# Patient Record
Sex: Male | Born: 1935 | Race: Black or African American | Hispanic: No | Smoking: Never smoker
Health system: Southern US, Community
[De-identification: ages and names within clinical notes are randomized; demographics above are authoritative.]

## PROBLEM LIST (undated history)

## (undated) DIAGNOSIS — Z9981 Dependence on supplemental oxygen: Secondary | ICD-10-CM

## (undated) DIAGNOSIS — M109 Gout, unspecified: Secondary | ICD-10-CM

## (undated) DIAGNOSIS — K219 Gastro-esophageal reflux disease without esophagitis: Secondary | ICD-10-CM

## (undated) DIAGNOSIS — I472 Ventricular tachycardia, unspecified: Secondary | ICD-10-CM

## (undated) DIAGNOSIS — I5020 Unspecified systolic (congestive) heart failure: Secondary | ICD-10-CM

## (undated) DIAGNOSIS — I2510 Atherosclerotic heart disease of native coronary artery without angina pectoris: Secondary | ICD-10-CM

## (undated) DIAGNOSIS — I255 Ischemic cardiomyopathy: Secondary | ICD-10-CM

## (undated) DIAGNOSIS — M1990 Unspecified osteoarthritis, unspecified site: Secondary | ICD-10-CM

## (undated) DIAGNOSIS — I4729 Other ventricular tachycardia: Secondary | ICD-10-CM

## (undated) DIAGNOSIS — IMO0001 Reserved for inherently not codable concepts without codable children: Secondary | ICD-10-CM

## (undated) DIAGNOSIS — K5730 Diverticulosis of large intestine without perforation or abscess without bleeding: Secondary | ICD-10-CM

## (undated) DIAGNOSIS — E785 Hyperlipidemia, unspecified: Secondary | ICD-10-CM

## (undated) DIAGNOSIS — I10 Essential (primary) hypertension: Secondary | ICD-10-CM

## (undated) DIAGNOSIS — K56609 Unspecified intestinal obstruction, unspecified as to partial versus complete obstruction: Secondary | ICD-10-CM

## (undated) DIAGNOSIS — Z95810 Presence of automatic (implantable) cardiac defibrillator: Secondary | ICD-10-CM

## (undated) HISTORY — PX: EYE SURGERY: SHX253

## (undated) HISTORY — PX: CATARACT EXTRACTION W/ INTRAOCULAR LENS  IMPLANT, BILATERAL: SHX1307

## (undated) HISTORY — PX: CHOLECYSTECTOMY: SHX55

## (undated) HISTORY — DX: Atherosclerotic heart disease of native coronary artery without angina pectoris: I25.10

## (undated) HISTORY — PX: BOWEL RESECTION: SHX1257

## (undated) HISTORY — PX: PACEMAKER PLACEMENT: SHX43

## (undated) HISTORY — PX: TONSILLECTOMY: SUR1361

---

## 1998-01-14 ENCOUNTER — Ambulatory Visit (HOSPITAL_COMMUNITY): Admission: RE | Admit: 1998-01-14 | Discharge: 1998-01-14 | Payer: Self-pay | Admitting: Cardiology

## 1999-07-13 ENCOUNTER — Encounter: Payer: Self-pay | Admitting: Emergency Medicine

## 1999-07-13 ENCOUNTER — Emergency Department (HOSPITAL_COMMUNITY): Admission: EM | Admit: 1999-07-13 | Discharge: 1999-07-13 | Payer: Self-pay | Admitting: Emergency Medicine

## 2000-09-27 ENCOUNTER — Emergency Department (HOSPITAL_COMMUNITY): Admission: EM | Admit: 2000-09-27 | Discharge: 2000-09-27 | Payer: Self-pay | Admitting: Emergency Medicine

## 2001-01-22 ENCOUNTER — Encounter: Payer: Self-pay | Admitting: Family Medicine

## 2001-01-22 ENCOUNTER — Encounter: Admission: RE | Admit: 2001-01-22 | Discharge: 2001-01-22 | Payer: Self-pay | Admitting: Family Medicine

## 2001-08-24 ENCOUNTER — Encounter (INDEPENDENT_AMBULATORY_CARE_PROVIDER_SITE_OTHER): Payer: Self-pay

## 2001-08-24 ENCOUNTER — Ambulatory Visit (HOSPITAL_COMMUNITY): Admission: RE | Admit: 2001-08-24 | Discharge: 2001-08-24 | Payer: Self-pay | Admitting: Gastroenterology

## 2001-10-17 ENCOUNTER — Encounter: Admission: RE | Admit: 2001-10-17 | Discharge: 2002-01-15 | Payer: Self-pay | Admitting: Family Medicine

## 2002-02-02 ENCOUNTER — Emergency Department (HOSPITAL_COMMUNITY): Admission: EM | Admit: 2002-02-02 | Discharge: 2002-02-02 | Payer: Self-pay | Admitting: *Deleted

## 2003-02-11 ENCOUNTER — Encounter: Payer: Self-pay | Admitting: Emergency Medicine

## 2003-02-11 ENCOUNTER — Emergency Department (HOSPITAL_COMMUNITY): Admission: EM | Admit: 2003-02-11 | Discharge: 2003-02-11 | Payer: Self-pay | Admitting: Emergency Medicine

## 2003-03-06 HISTORY — PX: TEE WITH CARDIOVERSION: SHX5442

## 2003-03-06 HISTORY — PX: CORONARY ARTERY BYPASS GRAFT: SHX141

## 2003-03-10 ENCOUNTER — Encounter: Payer: Self-pay | Admitting: Emergency Medicine

## 2003-03-10 ENCOUNTER — Inpatient Hospital Stay (HOSPITAL_COMMUNITY): Admission: EM | Admit: 2003-03-10 | Discharge: 2003-03-20 | Payer: Self-pay | Admitting: Emergency Medicine

## 2003-03-12 ENCOUNTER — Encounter: Payer: Self-pay | Admitting: Cardiology

## 2003-03-14 ENCOUNTER — Encounter: Payer: Self-pay | Admitting: Surgery

## 2003-03-15 ENCOUNTER — Encounter: Payer: Self-pay | Admitting: Surgery

## 2003-03-16 ENCOUNTER — Encounter: Payer: Self-pay | Admitting: Thoracic Surgery (Cardiothoracic Vascular Surgery)

## 2003-06-02 ENCOUNTER — Encounter (HOSPITAL_COMMUNITY): Admission: RE | Admit: 2003-06-02 | Discharge: 2003-08-31 | Payer: Self-pay | Admitting: Cardiology

## 2003-07-07 HISTORY — PX: CARDIAC DEFIBRILLATOR PLACEMENT: SHX171

## 2003-07-17 ENCOUNTER — Ambulatory Visit (HOSPITAL_COMMUNITY): Admission: RE | Admit: 2003-07-17 | Discharge: 2003-07-18 | Payer: Self-pay | Admitting: Internal Medicine

## 2004-01-09 ENCOUNTER — Emergency Department (HOSPITAL_COMMUNITY): Admission: EM | Admit: 2004-01-09 | Discharge: 2004-01-09 | Payer: Self-pay | Admitting: Family Medicine

## 2004-01-12 ENCOUNTER — Emergency Department (HOSPITAL_COMMUNITY): Admission: EM | Admit: 2004-01-12 | Discharge: 2004-01-12 | Payer: Self-pay | Admitting: Emergency Medicine

## 2004-01-16 ENCOUNTER — Emergency Department (HOSPITAL_COMMUNITY): Admission: EM | Admit: 2004-01-16 | Discharge: 2004-01-16 | Payer: Self-pay | Admitting: Family Medicine

## 2004-01-20 ENCOUNTER — Emergency Department (HOSPITAL_COMMUNITY): Admission: EM | Admit: 2004-01-20 | Discharge: 2004-01-20 | Payer: Self-pay | Admitting: Family Medicine

## 2004-05-07 ENCOUNTER — Emergency Department (HOSPITAL_COMMUNITY): Admission: EM | Admit: 2004-05-07 | Discharge: 2004-05-07 | Payer: Self-pay

## 2004-08-11 ENCOUNTER — Ambulatory Visit: Payer: Self-pay | Admitting: Internal Medicine

## 2004-08-28 ENCOUNTER — Emergency Department (HOSPITAL_COMMUNITY): Admission: EM | Admit: 2004-08-28 | Discharge: 2004-08-28 | Payer: Self-pay | Admitting: Family Medicine

## 2004-09-12 ENCOUNTER — Emergency Department (HOSPITAL_COMMUNITY): Admission: EM | Admit: 2004-09-12 | Discharge: 2004-09-12 | Payer: Self-pay | Admitting: Family Medicine

## 2004-09-21 ENCOUNTER — Emergency Department (HOSPITAL_COMMUNITY): Admission: EM | Admit: 2004-09-21 | Discharge: 2004-09-21 | Payer: Self-pay | Admitting: *Deleted

## 2004-10-27 ENCOUNTER — Emergency Department (HOSPITAL_COMMUNITY): Admission: EM | Admit: 2004-10-27 | Discharge: 2004-10-27 | Payer: Self-pay | Admitting: Family Medicine

## 2004-11-21 ENCOUNTER — Emergency Department (HOSPITAL_COMMUNITY): Admission: EM | Admit: 2004-11-21 | Discharge: 2004-11-21 | Payer: Self-pay | Admitting: Emergency Medicine

## 2004-12-23 ENCOUNTER — Ambulatory Visit: Payer: Self-pay

## 2005-03-01 ENCOUNTER — Ambulatory Visit: Payer: Self-pay | Admitting: Internal Medicine

## 2005-03-21 ENCOUNTER — Ambulatory Visit: Payer: Self-pay | Admitting: Internal Medicine

## 2005-03-25 ENCOUNTER — Emergency Department (HOSPITAL_COMMUNITY): Admission: EM | Admit: 2005-03-25 | Discharge: 2005-03-25 | Payer: Self-pay | Admitting: Family Medicine

## 2005-04-30 ENCOUNTER — Emergency Department (HOSPITAL_COMMUNITY): Admission: EM | Admit: 2005-04-30 | Discharge: 2005-05-01 | Payer: Self-pay | Admitting: Emergency Medicine

## 2005-05-28 ENCOUNTER — Emergency Department (HOSPITAL_COMMUNITY): Admission: EM | Admit: 2005-05-28 | Discharge: 2005-05-29 | Payer: Self-pay | Admitting: Emergency Medicine

## 2005-10-19 ENCOUNTER — Ambulatory Visit: Payer: Self-pay | Admitting: Internal Medicine

## 2005-11-14 ENCOUNTER — Emergency Department (HOSPITAL_COMMUNITY): Admission: EM | Admit: 2005-11-14 | Discharge: 2005-11-14 | Payer: Self-pay | Admitting: Family Medicine

## 2006-04-13 ENCOUNTER — Emergency Department (HOSPITAL_COMMUNITY): Admission: EM | Admit: 2006-04-13 | Discharge: 2006-04-14 | Payer: Self-pay | Admitting: Emergency Medicine

## 2006-04-14 ENCOUNTER — Encounter: Payer: Self-pay | Admitting: Vascular Surgery

## 2006-06-08 ENCOUNTER — Ambulatory Visit: Payer: Self-pay

## 2006-06-14 ENCOUNTER — Emergency Department (HOSPITAL_COMMUNITY): Admission: EM | Admit: 2006-06-14 | Discharge: 2006-06-14 | Payer: Self-pay | Admitting: Family Medicine

## 2007-03-22 ENCOUNTER — Ambulatory Visit: Payer: Self-pay | Admitting: Internal Medicine

## 2007-04-15 ENCOUNTER — Emergency Department (HOSPITAL_COMMUNITY): Admission: EM | Admit: 2007-04-15 | Discharge: 2007-04-15 | Payer: Self-pay | Admitting: Emergency Medicine

## 2007-05-19 ENCOUNTER — Emergency Department (HOSPITAL_COMMUNITY): Admission: EM | Admit: 2007-05-19 | Discharge: 2007-05-20 | Payer: Self-pay | Admitting: Emergency Medicine

## 2007-07-09 ENCOUNTER — Emergency Department (HOSPITAL_COMMUNITY): Admission: EM | Admit: 2007-07-09 | Discharge: 2007-07-09 | Payer: Self-pay | Admitting: Emergency Medicine

## 2007-07-24 ENCOUNTER — Ambulatory Visit: Payer: Self-pay | Admitting: Internal Medicine

## 2007-09-11 ENCOUNTER — Emergency Department (HOSPITAL_COMMUNITY): Admission: EM | Admit: 2007-09-11 | Discharge: 2007-09-12 | Payer: Self-pay | Admitting: Emergency Medicine

## 2007-10-11 ENCOUNTER — Ambulatory Visit: Payer: Self-pay

## 2008-01-07 ENCOUNTER — Ambulatory Visit: Payer: Self-pay

## 2008-04-09 ENCOUNTER — Ambulatory Visit: Payer: Self-pay

## 2008-04-25 ENCOUNTER — Encounter: Admission: RE | Admit: 2008-04-25 | Discharge: 2008-04-25 | Payer: Self-pay | Admitting: Cardiology

## 2008-07-22 ENCOUNTER — Ambulatory Visit: Payer: Self-pay | Admitting: Internal Medicine

## 2008-10-16 ENCOUNTER — Ambulatory Visit (HOSPITAL_COMMUNITY): Admission: RE | Admit: 2008-10-16 | Discharge: 2008-10-16 | Payer: Self-pay | Admitting: Ophthalmology

## 2008-11-18 ENCOUNTER — Encounter: Payer: Self-pay | Admitting: Internal Medicine

## 2009-01-19 ENCOUNTER — Encounter: Admission: RE | Admit: 2009-01-19 | Discharge: 2009-01-19 | Payer: Self-pay | Admitting: Cardiology

## 2009-01-21 ENCOUNTER — Encounter (INDEPENDENT_AMBULATORY_CARE_PROVIDER_SITE_OTHER): Payer: Self-pay | Admitting: *Deleted

## 2009-06-29 ENCOUNTER — Encounter (HOSPITAL_COMMUNITY): Admission: RE | Admit: 2009-06-29 | Discharge: 2009-09-04 | Payer: Self-pay | Admitting: Cardiology

## 2009-07-17 DIAGNOSIS — Z951 Presence of aortocoronary bypass graft: Secondary | ICD-10-CM

## 2009-07-17 DIAGNOSIS — I2510 Atherosclerotic heart disease of native coronary artery without angina pectoris: Secondary | ICD-10-CM

## 2009-07-23 ENCOUNTER — Ambulatory Visit: Payer: Self-pay | Admitting: Internal Medicine

## 2009-07-23 DIAGNOSIS — Z95810 Presence of automatic (implantable) cardiac defibrillator: Secondary | ICD-10-CM | POA: Insufficient documentation

## 2009-07-23 DIAGNOSIS — I5022 Chronic systolic (congestive) heart failure: Secondary | ICD-10-CM

## 2010-03-22 ENCOUNTER — Emergency Department (HOSPITAL_COMMUNITY): Admission: EM | Admit: 2010-03-22 | Discharge: 2010-03-22 | Payer: Self-pay | Admitting: Family Medicine

## 2010-08-11 ENCOUNTER — Encounter
Admission: RE | Admit: 2010-08-11 | Discharge: 2010-09-04 | Payer: Self-pay | Source: Home / Self Care | Attending: Cardiology | Admitting: Cardiology

## 2010-08-12 ENCOUNTER — Emergency Department (HOSPITAL_COMMUNITY)
Admission: EM | Admit: 2010-08-12 | Discharge: 2010-08-12 | Payer: Self-pay | Source: Home / Self Care | Admitting: Emergency Medicine

## 2010-08-17 ENCOUNTER — Observation Stay (HOSPITAL_COMMUNITY)
Admission: EM | Admit: 2010-08-17 | Discharge: 2010-08-19 | Payer: Self-pay | Source: Home / Self Care | Attending: Cardiology | Admitting: Cardiology

## 2010-08-20 ENCOUNTER — Encounter (INDEPENDENT_AMBULATORY_CARE_PROVIDER_SITE_OTHER): Payer: Self-pay | Admitting: *Deleted

## 2010-09-30 ENCOUNTER — Ambulatory Visit
Admission: RE | Admit: 2010-09-30 | Discharge: 2010-09-30 | Payer: Self-pay | Source: Home / Self Care | Attending: Internal Medicine | Admitting: Internal Medicine

## 2010-10-07 NOTE — Assessment & Plan Note (Signed)
Summary: per check out/sf  Medications Added ASPIRIN EC 325 MG TBEC (ASPIRIN) Take one/half   tablet by mouth daily      Allergies Added: NKDA  Visit Type:  Follow-up Primary Provider:  Otho Najjar MD  CC:  no complaints.  History of Present Illness: [name] returns today for followup.  He is an 77 yo man with a h/o an ICM and VT, s/p ICD implant.  He has class 2 CHF.  He has remained active and is still playing golf.  He denies c/p, sob, peripheral edema and has not experienced and ICD discharges.  Current Medications (verified): 1)  Spironolactone 25 Mg Tabs (Spironolactone) .... Take One Tablet By Mouth Daily 2)  Plavix 75 Mg Tabs (Clopidogrel Bisulfate) .... Take One Tablet By Mouth Daily 3)  Lipitor 20 Mg Tabs (Atorvastatin Calcium) .... Take One Tablet By Mouth Daily. 4)  Ziac 2.5-6.25 Mg Tabs (Bisoprolol-Hydrochlorothiazide) .Marland Kitchen.. 1 By Mouth Once Daily 5)  Aspirin 81 Mg Tbec (Aspirin) .... Take One Tablet By Mouth Daily 6)  Aspirin Ec 325 Mg Tbec (Aspirin) .... Take One/half   Tablet By Mouth Daily  Allergies (verified): No Known Drug Allergies  Past History:  Past Medical History: Last updated: 07/17/2009  coronary artery disease   pacemaker, bipass   Past Surgical History: Last updated: 07/23/2009  CABG - Coronary artery bypass graft,   Vital Signs:  Patient profile:   77 year old male Height:      65 inches Weight:      160 pounds BMI:     26.72 Pulse rate:   60 / minute BP sitting:   130 / 70  (left arm)  Vitals Entered By: Laurance Flatten CMA (September 30, 2010 12:01 PM)  Physical Exam  General:  Elderly well developed, well nourished, in no acute distress.  HEENT: normal Neck: supple. No JVD. Carotids 2+ bilaterally no bruits Cor: RRR no rubs, gallops or murmur Lungs: CTA.  Well healed ICD incision. Ab: soft, nontender. nondistended. No HSM. Good bowel sounds Ext: warm. no cyanosis, clubbing or edema Neuro: alert and oriented. Grossly nonfocal.  affect pleasant     ICD Specifications Following MD:  [name], MD     ICD Vendor:  Medtronic     ICD Model Number:  7232     ICD Serial Number:  [id number] ICD DOI:  07/17/2003     ICD Implanting MD:  [name], MD  Lead 1:    Location: RV     DOI: 07/17/2003     Model #: 2440     Serial #: [id number]     Status: active  Indications::  ICM   ICD Follow Up Battery Voltage:  2.71 V     Charge Time:  10.09 seconds     Underlying rhythm:  SR ICD Dependent:  No       ICD Device Measurements Right Ventricle:  Amplitude: 1.4 mV, Impedance: 364 ohms, Threshold: 1.0 V at 0.3 msec Shock Impedance: 43/59 ohms   Episodes MS Episodes:  0     Percent Mode Switch:  0     Shock:  0     ATP:  0     Nonsustained:  0     Atrial Therapies:  0 Ventricular Pacing:  2%  Brady Parameters Mode VVI     Lower Rate Limit:  40      Tachy Zones VF:  200     VT:  171     Next  Cardiology Appt Due:  09/06/2011 Tech Comments:  7 NST EPISODES.  NORMAL DEVICE FUNCTION.  NO CHANGES MADE. ROV IN 12 MTHS W/GT. Vella Kohler  September 30, 2010 11:57 AM MD Comments:  Agree with above.  Impression & Recommendations:  Problem # 1:  AUTOMATIC IMPLANTABLE CARDIAC DEFIBRILLATOR SITU (ICD-V45.02) HIs device is working normally. Will recheck in several months.  Problem # 2:  CHRONIC SYSTOLIC HEART FAILURE (ICD-428.22) His symptoms are class 2. Continue meds as below and maintain a low sodium diet. His updated medication list for this problem includes:    Spironolactone 25 Mg Tabs (Spironolactone) .Marland Kitchen... Take one tablet by mouth daily    Plavix 75 Mg Tabs (Clopidogrel bisulfate) .Marland Kitchen... Take one tablet by mouth daily    Ziac 2.5-6.25 Mg Tabs (Bisoprolol-hydrochlorothiazide) .Marland Kitchen... 1 by mouth once daily    Aspirin 81 Mg Tbec (Aspirin) .Marland Kitchen... Take one tablet by mouth daily    Aspirin Ec 325 Mg Tbec (Aspirin) .Marland Kitchen... Take one/half   tablet by mouth daily  Problem # 3:  CORONARY ARTERY DISEASE (ICD-414.00) He denies  anginal symptoms. Continue meds as below. His updated medication list for this problem includes:    Plavix 75 Mg Tabs (Clopidogrel bisulfate) .Marland Kitchen... Take one tablet by mouth daily    Ziac 2.5-6.25 Mg Tabs (Bisoprolol-hydrochlorothiazide) .Marland Kitchen... 1 by mouth once daily    Aspirin 81 Mg Tbec (Aspirin) .Marland Kitchen... Take one tablet by mouth daily    Aspirin Ec 325 Mg Tbec (Aspirin) .Marland Kitchen... Take one/half   tablet by mouth daily  Patient Instructions: 1)  Your physician wants you to follow-up in: 6 months with Dr Court Joy will receive a reminder letter in the mail two months in advance. If you don't receive a letter, please call our office to schedule the follow-up appointment.

## 2010-10-07 NOTE — Letter (Signed)
Summary: Appointment - Missed  [facility], Main Office  [address]   [place], [place] [address]   Phone: [phone]  Fax: [phone]     August 20, 2010 MRN: [id number]   MIKOLAJ WOOLSTENHULME [address] [place], [place]  [address]   Dear Mr. Sutherland,  Our records indicate you missed your appointment on 08-10-10  with Dr.  Ladona Ridgel .                                    It is very important that we reach you to reschedule this appointment. We look forward to participating in your health care needs. Please contact us at the number listed above at your earliest convenience to reschedule this appointment.     Sincerely,    Glass blower/designer

## 2010-11-15 LAB — CBC
Hemoglobin: 12.5 g/dL — ABNORMAL LOW (ref 13.0–17.0)
MCH: 32 pg (ref 26.0–34.0)
MCHC: 34.2 g/dL (ref 30.0–36.0)
MCHC: 35.6 g/dL (ref 30.0–36.0)
MCV: 89.8 fL (ref 78.0–100.0)
Platelets: 147 10*3/uL — ABNORMAL LOW (ref 150–400)
RDW: 13.1 % (ref 11.5–15.5)
WBC: 8.2 10*3/uL (ref 4.0–10.5)

## 2010-11-15 LAB — URINALYSIS, MICROSCOPIC ONLY
Glucose, UA: NEGATIVE mg/dL
Leukocytes, UA: NEGATIVE
Protein, ur: NEGATIVE mg/dL
pH: 5 (ref 5.0–8.0)

## 2010-11-15 LAB — COMPREHENSIVE METABOLIC PANEL
ALT: 18 U/L (ref 0–53)
AST: 25 U/L (ref 0–37)
Albumin: 2.6 g/dL — ABNORMAL LOW (ref 3.5–5.2)
Alkaline Phosphatase: 73 U/L (ref 39–117)
BUN: 44 mg/dL — ABNORMAL HIGH (ref 6–23)
CO2: 22 mEq/L (ref 19–32)
Calcium: 8.5 mg/dL (ref 8.4–10.5)
Calcium: 9.2 mg/dL (ref 8.4–10.5)
Creatinine, Ser: 1.64 mg/dL — ABNORMAL HIGH (ref 0.4–1.5)
GFR calc Af Amer: 57 mL/min — ABNORMAL LOW (ref >60)
Potassium: 4 mEq/L (ref 3.5–5.1)
Sodium: 130 mEq/L — ABNORMAL LOW (ref 135–145)
Total Bilirubin: 1.3 mg/dL — ABNORMAL HIGH (ref 0.3–1.2)
Total Protein: 5.8 g/dL — ABNORMAL LOW (ref 6.0–8.3)
Total Protein: 7.1 g/dL (ref 6.0–8.3)

## 2010-11-15 LAB — DIFFERENTIAL
Eosinophils Absolute: 0 10*3/uL (ref 0.0–0.7)
Eosinophils Relative: 0 % (ref 0–5)
Lymphs Abs: 0.5 10*3/uL — ABNORMAL LOW (ref 0.7–4.0)
Monocytes Relative: 9 % (ref 3–12)

## 2010-12-20 NOTE — Discharge Summary (Signed)
  [name], [name] NO.:  [id number]  MEDICAL RECORD NO.:  [id number]          PATIENT TYPE:  OBV  LOCATION:  4709                         FACILITY:  [facility]  PHYSICIAN:  Osvaldo Shipper. Spirit Wernli, M.D.DATE OF BIRTH:  Mar 14, 1927  DATE OF ADMISSION:  08/17/2010 DATE OF DISCHARGE:  08/19/2010                              DISCHARGE SUMMARY   DISCHARGE DIAGNOSES: 1. Acute gout. 2. Coronary artery disease. 3. Degenerative joint disease. 4. Acute inability to ambulate.  [name] is an 77 year old patient who presented to the [facility] with complaint of foot pain.  The patient had been treated as an outpatient for this problem and continued to have increasing severity of pain in the left foot creating an inability for him to walk.  On examination, he was noted to have right and left great toe pain and tenderness to touch.  His uric acid levels were significantly elevated and he was subsequently admitted with acute gout with inability to ambulate and no home support.  HOSPITAL COURSE:  The patient was admitted to telemetry.  He was placed on colchicine, Motrin, and IV Dilaudid cautiously.  He was seen by pharmacy for Lovenox prophylaxis.  On December 15, the patient was able to ambulate and it was the opinion that he had received maximum benefit from this hospitalization and could safely be returned home.  The patient is to continue his medications at discharge.  He will continue on colchicine during this pain episode.  He will also be treated with an oral narcotic pain medication and he will start allopurinol on the 26 and will be followed in the office that same week for reevaluation.  The patient is advised to notify the physician immediately of any changes, problems, or concerns.  He is very strongly encouraged to use caution getting around given his improved, but limited ambulation, and his advanced age.     Ivery Quale,  P.A.   ______________________________ Osvaldo Shipper. Andreana Klingerman, M.D.    HB/MEDQ  D:  11/11/2010  T:  11/11/2010  Job:  [id number]  Electronically Signed by Ivery Quale P.A. on 11/18/2010 12:22:13 PM Electronically Signed by Donia Guiles M.D. on 12/20/2010 08:40:26 PM

## 2010-12-21 LAB — HEPATIC FUNCTION PANEL
Albumin: 3.8 g/dL (ref 3.5–5.2)
Total Protein: 6.7 g/dL (ref 6.0–8.3)

## 2010-12-21 LAB — CBC
MCHC: 35.3 g/dL (ref 30.0–36.0)
Platelets: 119 10*3/uL — ABNORMAL LOW (ref 150–400)
RBC: 3.92 MIL/uL — ABNORMAL LOW (ref 4.22–5.81)
RDW: 15.7 % — ABNORMAL HIGH (ref 11.5–15.5)

## 2010-12-21 LAB — APTT: aPTT: 31 seconds (ref 24–37)

## 2010-12-21 LAB — PROTIME-INR
INR: 1 (ref 0.00–1.49)
Prothrombin Time: 13.5 seconds (ref 11.6–15.2)

## 2010-12-21 LAB — GLUCOSE, CAPILLARY: Glucose-Capillary: 132 mg/dL — ABNORMAL HIGH (ref 70–99)

## 2010-12-21 LAB — BASIC METABOLIC PANEL
CO2: 28 mEq/L (ref 19–32)
Calcium: 9.7 mg/dL (ref 8.4–10.5)
Creatinine, Ser: 1.27 mg/dL (ref 0.4–1.5)
GFR calc Af Amer: >60 mL/min (ref >60)

## 2011-01-06 ENCOUNTER — Inpatient Hospital Stay (INDEPENDENT_AMBULATORY_CARE_PROVIDER_SITE_OTHER)
Admission: RE | Admit: 2011-01-06 | Discharge: 2011-01-06 | Disposition: A | Discharge status: Home / Self Care | Payer: Self-pay | Source: Ambulatory Visit | Attending: Emergency Medicine | Admitting: Emergency Medicine

## 2011-01-06 DIAGNOSIS — S01309A Unspecified open wound of unspecified ear, initial encounter: Secondary | ICD-10-CM

## 2011-01-18 NOTE — Assessment & Plan Note (Signed)
[facility]                         ELECTROPHYSIOLOGY OFFICE NOTE   NAME:[name], [name] PILLSBURY                      MRN:          [id number]  DATE:07/22/2008                            DOB:          [birth year]    [name] returns today for followup.  He is a very pleasant elderly  male with a history of ischemic cardiomyopathy and congestive heart  failure, status post BiV ICD insertion rather status post single chamber  ICD insertion.  He returns today for followup.  The patient admits to  some medical noncompliance and he has been out of his medicines for  several weeks.  He has been seen by Dr. Brunilda Payor and has had some prostate  problem is unclear whether he has prostate cancer or not.  He has  scheduled for a procedure in December to evaluate this.  By his report,  he suppose to stop his Plavix.  Today, his medications of which he is  not taking previously were;  1. Bisoprolol/HCTZ 2.5/6.25 daily.  2. Aldactone 25 daily.  3. Plavix 75 daily.  4. Lipitor 20 a day.  5. Aspirin 81 a day.   PHYSICAL EXAMINATION:  GENERAL:  He is a pleasant, elderly-appearing man  in no acute distress.  VITAL SIGNS:  Blood pressure is 110/70, the pulse is 50 and regular,  respirations are 18, and the weight is 169 pounds.  NECK:  No jugular venous distention.  LUNGS:  Clear bilaterally auscultation.  No wheezes, rales, or rhonchi  are present.  CARDIOVASCULAR:  Regular bradycardia with normal S1 and S2.  ABDOMEN:  Soft and nontender.  EXTREMITIES:  No peripheral edema.   Interrogation of his defibrillator demonstrates a Medtronic Maximo, the  R-waves were 3 (this is a chronic), impendence 332, threshold 1 volt at  0.4.  The battery voltage was 2.99 volts.  Underlying rhythm was sinus  bradycardia with frequent PVCs.  There are no intercurrent IC therapies.  He was 3% V paced.   IMPRESSION:  1. Ischemic cardiomyopathy.  2. Congestive heart failure.  3. Status post  implantable cardioverter-defibrillator insertion.   DISCUSSION:  Overall, [name] is stable.  I have asked to stop his  Plavix in late November preceding his pending urologic procedure.  I  will see him back in the office in 1 year for followup.  I have asked  him to maintain a low-salt diet.    Doylene Canning. Ladona Ridgel, MD  Electronically Signed   GWT/MedQ  DD: 07/22/2008  DT: 07/23/2008  Job #: [id number]

## 2011-01-18 NOTE — Op Note (Signed)
NAMEANGUEL, [name]               ACCOUNT NO.:  [id number]   MEDICAL RECORD NO.:  [id number]          PATIENT TYPE:  AMB   LOCATION:  SDS                          FACILITY:  [facility]   PHYSICIAN:  Chalmers Guest, M.D.     DATE OF BIRTH:  04-Nov-1926   DATE OF PROCEDURE:  10/16/2008  DATE OF DISCHARGE:                               OPERATIVE REPORT   PREOPERATIVE DIAGNOSIS:  Visually significant cataract, left eye.   POSTOPERATIVE DIAGNOSIS:  Visually significant cataract, left eye.   INDICATION FOR HOSPITAL SURGERY:  The patient has a defibrillator and  pacemaker for previous cardiovascular disease.   PROCEDURE:  Phacoemulsification intraocular lens implant.   COMPLICATIONS:  None.   ANESTHESIA:  Consisted of 2% Xylocaine and a 50:50 mixture of 0.75%  Marcaine with an ampule of Wydase.   PROCEDURE IN DETAIL:  The patient was transferred to the operating room  where a peribulbar block was given under monitored anesthesia with the  aforementioned local anesthetic agent.  Following this with the surgeon  sitting temporally and the operating microscope aligned temporally, a  Weck-cel sponge was used to fixate the globe and a 15-degree blade was  used to enter the eye at the 5 o'clock position and Viscoat was injected  in the eye.  Following this, the Weck-cel was used but the globe would  not remain fixated.  Therefore, a 0.12 was used to fixate the globe and  a clear cornea incision was made into the anterior chamber.  Additional  viscoelastic was injected.  A subconjunctival hemorrhage formed at the  site of the 0.12 temporally.  Following this, a bent 25-gauge needle was  used to incise the anterior capsule, a curvilinear capsulorrhexis was  formed, and anterior capsule was removed with Utrata forceps.  Following  this, BSS was used to hydrodissect the nucleus and the nucleus was seen  to spin freely in a capsular bag.  The phacoemulsification unit was then  used to sculpt the  nucleus and the nucleus was sculpted and divided into  4 quadrants and a total nucleus was removed with phaco time with 0.9.  Following this, the I/A was used to remove the cortical fibers.  The  posterior capsule remained intact.  Therefore, intraocular lens was  inspected and noted to have no defects.  The lens was an Alcon AcrySof  IQ, power 27.0 diopter lens, [id number].  The lens was placed in the lens  injector.  It was injected and unfolded into the capsular bag.  It was  positioned with the Kuglen hook.  Following this, the I/A was used to  remove viscoelastic from the eye and Miostat was injected.  A single 10-  0 nylon suture was placed.  BSS was injected in the eye and the lens was  repositioned.  Watertight closure was  achieved with no leakage.  Therefore, the lid speculum was removed from  the eye and topical gentamicin was dripped on the eye as well as  TobraDex ointment.  A patch and [name] shield were placed and the patient  returned to the recovery area in stable  condition.      Chalmers Guest, M.D.  Electronically Signed     RW/MEDQ  D:  10/16/2008  T:  10/16/2008  Job:  [id number]   cc:   Fax #:  [phone]

## 2011-01-18 NOTE — Assessment & Plan Note (Signed)
[facility]                         ELECTROPHYSIOLOGY OFFICE NOTE   NAME:Kiesler, [name]                      MRN:          [id number]  DATE:07/24/2007                            DOB:          1926-09-27    [name] returns today after a several-year absence from our EP  clinic.  He is a very pleasant elderly man with a history of ischemic  cardiomyopathy, congestive heart failure, nonsustained VT, status post  ICD insertion.  He returns today for followup.  He denies chest pain.  His biggest complaint has been that of gout.  This is now better, after  being seen in the emergency department.   PHYSICAL EXAM:  He is a pleasant, well-appearing, 77 year old man, in no  acute distress.  Blood pressure is 122/74 with a pulse of 65 and regular, respirations  are 18, weight was 167 pounds.  NECK:  Revealed no jugular venous distention.  LUNGS:  Clear bilaterally to auscultation, no wheezes, rales or rhonchi.  CARDIOVASCULAR EXAM:  Revealed a regular rate and rhythm, normal S1 and  S2.  The PMI was not enlarged.  There were no gallops noted.  EXTREMITIES:  Demonstrated no cyanosis, clubbing or edema.   MEDICATIONS INCLUDE:  1. Bisoprolol/HCTZ 2.5/6.25 daily.  2. Enalapril 10 twice a day.  3. Aldactone 25 daily.  4. Lipitor 10 daily.  5. Aspirin 325 daily.   Interrogation of his defibrillator demonstrates a Medtronic Maximo with  R-waves of 3.5, the impedance of 356, the threshold of 0.23, the battery  voltage of 3.04 volts.  There were no intercurrent ICD therapies.   IMPRESSION:  1. Ischemic cardiomyopathy.  2. Congestive heart failure.  3. Status post ICD insertion.   DISCUSSION:  Overall, [name] is stable.  I have encouraged him to  maintain a low-sodium diet and take his medications.  We will see him  back in the office in one year and he will follow up in our device  clinic in three to four months.  We will also give him a flu  vaccine  today.     Doylene Canning. Ladona Ridgel, MD  Electronically Signed    GWT/MedQ  DD: 07/24/2007  DT: 07/24/2007  Job #: [id number]

## 2011-01-20 ENCOUNTER — Ambulatory Visit (INDEPENDENT_AMBULATORY_CARE_PROVIDER_SITE_OTHER): Payer: PRIVATE HEALTH INSURANCE | Admitting: *Deleted

## 2011-01-20 DIAGNOSIS — I428 Other cardiomyopathies: Secondary | ICD-10-CM

## 2011-01-20 DIAGNOSIS — I5022 Chronic systolic (congestive) heart failure: Secondary | ICD-10-CM

## 2011-01-21 NOTE — Op Note (Signed)
NAME:  [name], [name]                         ACCOUNT NO.:  [id number]   MEDICAL RECORD NO.:  [id number]                   PATIENT TYPE:  INP   LOCATION:  2312                                 FACILITY:  [facility]   PHYSICIAN:  Evelene Croon, M.D.                  DATE OF BIRTH:  November 13, 1926   DATE OF PROCEDURE:  03/14/2003  DATE OF DISCHARGE:                                 OPERATIVE REPORT   PREOPERATIVE DIAGNOSIS:  Severe 3-vessel coronary artery disease with severe  left ventricular dysfunction, status post  acute inferior myocardial  infarction.   POSTOPERATIVE DIAGNOSIS:  Severe 3-vessel coronary artery disease with  severe left ventricular dysfunction, status post  acute inferior myocardial  infarction.   PROCEDURE:  1. Median sternotomy, extracorporeal circulation, coronary artery bypass     graft surgery x5 using left internal mammary artery graft  to the left     anterior descending coronary artery with a saphenous vein graft to the     diagonal branch of the LAD, a  sequential saphenous vein graft to the 1st     and 2nd intermediate branches of the left circumflex coronary artery, and     a saphenous vein graft to the posterior descending branch of the right     coronary artery.  2. Endoscopic vein harvesting from the right thigh.  3. Placement of 2 left ventricular epicardial permanent pacing leads.   SURGEON:  Evelene Croon, M.D.   ASSISTANT:  Toribio Harbour, N.P.   ANESTHESIA:  General endotracheal anesthesia.   CLINICAL HISTORY:  This patient is a 77 year old gentleman  with no prior  cardiac history who was admitted with an evolving inferior myocardial  infarction. Cardiac catheterization revealed severe 3-vessel coronary artery  disease with severe left ventricular dysfunction with an ejection fraction  of about 15%. The LAD was a large vessel that had 70% proximal stenosis and  90% proximal to mid vessel stenosis. The diagonal branch had 95% proximal  stenosis. There was a large branching intermediate vessel that had a 1st  subbranch that had 90% stenosis. The major portion of the intermediate  artery had about 50% proximal stenosis. There was a small  AV groove portion  of the left circumflex that had 80% stenosis. The right coronary artery was  occluded proximally with filling of the distal vessel by right-to-right and  left-to-right collaterals. There was 1+ mitral regurgitation. There was  severe hypokinesis of the anterior wall and akinesis of the inferior wall.   The patient underwent an echocardiogram  which showed an ejection fraction  of about 20% to 30% with mild diffuse hypokinesis and akinesis of the  inferior posterior  wall and superolateral hypokinesis. There was mild  mitral regurgitation and no AI or AS.   After review of  the angiogram and examination of the patient, it was felt  that coronary artery bypass graft surgery was  the best treatment. I  discussed  the operative procedure with him including alternatives, benefits  and risks, including bleeding, blood transfusion, infection, stroke,  myocardial infarction and death. He understood and agreed to proceed.   DESCRIPTION OF PROCEDURE:  The patient was taken to the operating room and  placed on the table in the supine position. After induction of general  endotracheal anesthesia a Foley catheter was placed in the bladder using  sterile technique. Then the chest, abdomen and both lower extremities were  prepped and draped in the usual sterile manner.   The chest was entered through a median sternotomy incision and the  pericardium opened in the midline. Examination of the heart showed good  right ventricular contractility. The ascending aorta had no palpable plaques  in it.   A transesophageal echocardiogram  was performed and showed a dilated left  ventricle with severe hypokinesis of the anterior and lateral walls. The  inferior posterior  wall was akinetic.  There was trivia mitral  regurgitation.   Then the left internal mammary artery was harvested from the chest wall as a  pedicle graft. This was a medium caliber vessel with excellent blood flow  through it. At the same time a segment of greater saphenous vein was  harvested from the leg using endoscopic vein harvest technique. This vein  was of medium size and good quality.   Then the patient was heparinized and when an adequate activated clotting  time was achieved, the distal ascending aorta was cannulated using a 20  French aortic cannula for arterial inflow. Venous outflow was achieved using  a 2-stage venous cannula through the right atrial appendage. Then an  antegrade cardioplegia and vent cannula was inserted in the aortic root.   The patient was placed on cardiopulmonary bypass and the distal coronary  artery was identified. The LAD was a large graftable vessel. The diagonal  branch was intramyocardial but was a medium sized, graftable vessel. The 2  branches of the intermediate vessel were both intramyocardial but were  located and were suitable for grafting. The AV groove portion of the left  circumflex was not graftable. There was old inferior posterior MI with  scarring present throughout the inferior posterior wall. This area was  adhesed to the pericardium from old pericarditis. The right coronary artery  was a small vessel. It gave off a small posterior  descending branch which  was small  but suitable for grafting, even though this may not supply any  viable myocardium.   The aorta was cross clamped and 800 mL of cold blood antegrade cardioplegia  was administered in the aortic root with quick arrest of the heart. Systemic  hypothermia to 20 degrees centigrade and topical hypothermia with iced  saline was used. A temperature  probe was placed in the septum and was  inserted down the pericardium.  The 1st distal  anastomosis was performed to the 1st branch of the   intermediate coronary artery. The internal diameter of this vessel was 1.6  mm The conduit used was a segment of greater saphenous vein with anastomosis  performed in a  sequential side-to-side manner using continuous 7-0 Prolene  suture. The flow was measured through the graft and was excellent.   The 2nd distal anastomosis was performed to the 2nd marginal branch. The  internal diameter of this vessel was about 2 mm. The conduit used was the  same segment of greater saphenous vein and the anastomosis was performed in  a  sequential end-to-side manner using continuous 7-0 Prolene suture. Flow  was noted through the graft  and was excellent. Then another dose of  cardioplegia was given down the vein graft and the aortic root.   A 3rd distal anastomosis was performed to the diagonal branch. The internal  diameter was about 1.5 mm. The conduit used was a segment of greater  saphenous vein. The anastomosis was performed in an end-to-side manner using  continuous 7-0 Prolene suture. Flow was noted through the graft  and was  excellent.   The 4th distal anastomosis was then performed in the posterior descending  coronary artery. The internal diameter was 1.5 mm. The conduit used was a  3rd segment of greater saphenous vein and the anastomosis was performed in  an end-to-side manner using continuous 7-0 Prolene suture. Flow was noted  through the graft and was excellent. Another dose of cardioplegia was given  down the vein graft and into the aortic root.   The 5th distal anastomosis was then performed to the midportion of the left  anterior descending coronary artery. The internal diameter was about 2 mm.  The conduit used was a left internal mammary graft and this was brought  through an opening in the left pericardium anterior to the phrenic nerve. It  was anastomosed to the LAD in an end-to-side manner using continuous 8-0  Prolene suture. The pec was tacked to the epicardium with 6-0  Prolene  sutures.   The patient was rewarmed to 37 degrees centigrade and the clamp was removed  from the mammary pedicle. There was rapid rewarming of the ventricular  septum and return of spontaneous ventricular fibrillation. The cross clamp  was removed. The time was 79 minutes, and the patient spontaneously  converted to sinus rhythm.   A partial occlusion clamp was placed in the aortic root and the 3 proximal  vein graft anastomoses were performed in an end-to-side manner using  continuous 6-0 Prolene suture. The clamp was removed. The vein graft was  deaired and the clamps removed from them. The proximal and distal  anastomoses appeared  hemostatic and the line of the graft  satisfactory.  Graft markers were placed around the proximal anastomoses. Two temporary  left ventricular and right atrial pacing wires were placed and brought out  through the skin.  Then 2 Medtronic screw-in permanent epicardial pacing leads were placed on  the lateral  wall. One had serial number [id number]; the 2nd one had serial  number [id number]. Both leads were tested and had excellent sensitivity. The  pacing thresholds for both leads was 0.5. These leads were then brought  through a small  opening in the left lateral  pericardium and were brought  up to the left chest wall where they were tunneled through the chest wall  into a subcutaneous pocket developed in the left infraclavicular region.  They were tapped and coiled in the pocket.   When the patient had rewarmed to 37 degrees centigrade, he was weaned from  cardiopulmonary bypass on Dopamine. Total bypass time was 128 minutes.  Cardiac  function appeared  improved on transesophageal echocardiogram. All  the walls were appearing to be moving.  Cardiac index  initially was 5.6.  Then Protamine was given and the venous and aortic cannulas were  removed  without difficulty. Hemostasis was achieved. The patient was given aprotinin  for this case  and was given 10 units of platelets due to a platelet count of  60,000.   Then  3 chest tubes were placed with 1 in the posterior pericardium, 1 in the  left pleural space and 1 in the anterior mediastinum. The pericardium was  loosely reapproximated over the heart. The sternum was closed with #6  stainless steel wires. The fascia was closed with continuous #1 Vicryl  suture. The subcutaneous tissue was closed with interrupted 2-0 Vicryl and  the skin with a 3-0 Vicryl subcuticular closure. The lower extremity vein  harvest site was closed in layers in a similar manner.  All sponge,  instrument and needle counts were correct according to the scrub nurse. Dry  sterile dressings were applied over the incisions and around the chest tubes  which were hooked to Pleurovac suction.   The patient remained  hemodynamically stable. He was transported to the SICU  in guarded but stable condition.                                                 Evelene Croon, M.D.    BB/MEDQ  D:  03/14/2003  T:  03/15/2003  Job:  [id number]   cc:   Charlies Constable, M.D.   Cardiac [facility]

## 2011-01-21 NOTE — Op Note (Signed)
NAME:  [name], [name]                         ACCOUNT NO.:  [id number]   MEDICAL RECORD NO.:  [id number]                   PATIENT TYPE:  INP   LOCATION:  2312                                 FACILITY:  [facility]   PHYSICIAN:  Sheldon Silvan, M.D.                   DATE OF BIRTH:  06-30-1927   DATE OF PROCEDURE:  03/14/2003  DATE OF DISCHARGE:                                 OPERATIVE REPORT   PROCEDURE:  Interoperative transesophageal echocardiography (TEE).   INDICATIONS FOR PROCEDURE:  [name] was brought to the operating room by  Dr. Laneta Simmers today for coronary artery bypass grafting. He was known to have a  low ejection fraction in the 15% to 20% range, and it was felt that  monitoring him by means of a TEE would be appropriate for both diagnostic  and therapeutic purposes.   DESCRIPTION OF PROCEDURE:  After completion of anesthetic induction and  satisfactory endotracheal intubation, a [employer] OmniPlane TEE probe  with a sheath on it was passed through the oropharynx easily into the  esophagus on the first pass without trauma noted.   The heart  was visualized and the LV was seen. The contractility of  it was  decreased and hypokinetic in all segments. The lateral wall was most  affected. The mitral valve was noted to be normal in structural appearance.  There was trace to 1+ regurgitation noted centrally. The aortic valve was  somewhat sclerotic although there was no evidence of regurgitation noted on  color flow examination in the long axis view. The left atrial appendage was  imaged and  found to be free of clot or smoke. The intraatrial septum was  imaged and on color flow examination there was no obvious patent foramen  ovale noted. The right ventricle and right atrium appeared normal in size  and shape. The tricuspid valve was normal in structure and had trace to 1+  regurgitation on color flow examination.   The patient was placed on the cardiopulmonary bypass  machine and coronary  artery bypass graft was completed by Dr. Laneta Simmers. The bypass was discontinued  appropriately and examination of the heart by TEE showed the walls of the  ventricle to still  remain slightly thickened, obviously, and it was noted  that the contractility of all 4 segments was quite increased compared to the  preoperative status. The cardiac output was excellent at 5.6 and there was  no change in any of the valve appearances. Monitoring of  the left  ventricular filling was performed with the TEE and the patient responded  well.   He was transferred successfully to the SICU. Removal of the TEE probe was  accomplished prior to transfer.  Sheldon Silvan, M.D.    DC/MEDQ  D:  03/15/2003  T:  03/16/2003  Job:  [id number]

## 2011-01-21 NOTE — Discharge Summary (Signed)
NAME:  [name], [name]                         ACCOUNT NO.:  [id number]   MEDICAL RECORD NO.:  [id number]                   PATIENT TYPE:  OIB   LOCATION:  2004                                 FACILITY:  [facility]   PHYSICIAN:  Doylene Canning. Ladona Ridgel, M.D.               DATE OF BIRTH:  1927-06-11   DATE OF ADMISSION:  07/17/2003  DATE OF DISCHARGE:  07/18/2003                                 DISCHARGE SUMMARY   PRIMARY DIAGNOSIS:  Ischemic cardiomyopathy.  The patient is admitted for  defibrillator implant __________.   HISTORY:  He is a 77 year old gentleman, past medical history of acute  inferior MI, subsequent CABG with an EF of 15% in July.  Postoperatively the  patient had an EP study which was not inducible for VT.  Three months after  CABG the patient had a repeat echocardiogram with an EF of 20-25%.  The  patient was admitted for ICD placement.  Prior to implant the patient  complained of a 7/10 left-side pain.  Blood pressure at the time was  140/100, 80, 18.  He was given an sublingual nitroglycerin with a decrease  in the pain to a 4/10.  EKG showed no acute changes as well as chest x-ray  with no active disease.   HOSPITAL COURSE:  The patient was then taken to the EP laboratory and  underwent placement of a left subclavian ICD Medtronic type.  He tolerated  the procedure well, had no immediate postoperative complications, and was  discharged to home the following day in stable condition.  The patient was  instructed to take all of his medications as previous.  He was unsure of his  medications at the time of discharge that he had been taking, states that he  had a list but the list is missing.  The patient does not have his  medications with him.   On reviewing the last medication list at [facility] on November 4, the  patient's medications were:  1. Amaryl 2 mg daily.  2. Aspirin 325 daily.  3. Bisoprolol 2.5 mg daily.  4. Enalapril 10 b.i.d.  5. Lipitor 10 nightly.  6.  Spironolactone 25 daily.  7. The patient was instructed to take all of his medications as he had been     previous to procedure.  He was to take Tylenol 1-2 tablets q.4-6 h. as     needed.   DISCHARGE INSTRUCTIONS:  Activity and wound care were per pacemaker  discharge sheet.  Low-fat, low-salt, low-cholesterol diet.  The patient was  to be seen at the [facility] at [facility] August 06, 2003 at 9:45 a.m.  and Dr. Ladona Ridgel October 22, 2003 at 9:20 a.m.      Chinita Pester, C.R.N.P. [facility]                 Doylene Canning. Ladona Ridgel, M.D.    DS/MEDQ  D:  07/18/2003  T:  07/19/2003  Job:  [id number]   cc:   [facility], M.D.

## 2011-01-21 NOTE — Op Note (Signed)
[place]  Patient:    [name], [name] Visit Number: [id number] MRN: [id number]          Service Type: Attending:  Florencia Reasons, M.D. Dictated by:   Florencia Reasons, M.D. Proc. Date: 08/24/01   CC:         Aliene Altes, M.D.             Charlynne Pander Bruna Potter, M.D.                           Operative Report  PROCEDURE:  Colonoscopy with biopsy.  ENDOSCOPIST:  Florencia Reasons, M.D.  INDICATIONS:  This patient had transient mild anemia, non-microcytic, back in May of this year with subsequent spontaneous normalization.  The patient does not have significant GI tract symptomatology other than occasional diarrhea. He was Hemoccult negative for me in the office.  This procedure is being done primarily for evaluation of transient anemia and also for routine colon cancer screening.  FINDINGS: Diminutive sessile polyp in the proximal colon.  Right-sided diverticulosis.  INFORMED CONSENT:  The nature, purpose, and risks of the procedure have been discussed with the patient who provided written consent.  SEDATION:  Fentanyl 40 mcg, Versed 4 mg IV without arrhythmias or desaturation.  DESCRIPTION OF PROCEDURE:  Digital exam of the prostate was normal.  The Olympus adjustable tension pediatric videoscope was advanced to the cecum as identified by visualization of the appendiceal orifice and pullback was then performed.  The quality of the prep was excellent and it was felt that all areas were well seen. There was a little bit of residual stool in the proximal colon that was able to be irrigated out of the way to a large degree, and it is not felt that any significant lesions would have been missed.  There was some mild right-sided diverticulosis.  There was a diminutive 2 mm hyperplastic-appearing polyp on a fold in the ascending colon a short distance above the cecum, removed by a single cold biopsy.  No large polyps, cancer, colitis or  vascular malformations were noted. Retroflexion was attempted in the rectum but could not be accomplished due to the small rectal ampulla.  The patient tolerated the procedure well, and there were no apparent complications.  IMPRESSION: 1. Solitary small sessile polyp in the proximal colon, removed. 2. Mild right-sided diverticulosis.  PLAN:  Await pathology on the polyp with consideration for possible follow-up colonoscopy in five years if the polyp is adenomatous in character, otherwise, flexible sigmoidoscopy in five years would probably be appropriate for routine screening. Dictated by:   Florencia Reasons, M.D. Attending:  Florencia Reasons, M.D. DD:  08/24/01 TD:  08/26/01 Job: [id number]

## 2011-01-21 NOTE — Op Note (Signed)
NAME:  [name], OTTLEY                         ACCOUNT NO.:  [id number]   MEDICAL RECORD NO.:  [id number]                   PATIENT TYPE:  OIB   LOCATION:  2004                                 FACILITY:  [facility]   PHYSICIAN:  Doylene Canning. Ladona Ridgel, M.D.               DATE OF BIRTH:  [birth year]   DATE OF PROCEDURE:  07/17/2003  DATE OF DISCHARGE:                                 OPERATIVE REPORT   PROCEDURE PERFORMED:  Insertion of a single chamber implantable cardioverter-  defibrillator.   INDICATIONS FOR PROCEDURE:  Ischemic cardiomyopathy status post myocardial  infarction with a QRS duration of and an ejection fraction of 25%.   INTRODUCTION:  The patient is a 77 year old man who is admitted to the  hospital approximately four months ago with acute myocardial infarction.  His initial ejection fraction was 15%.  He underwent bypass surgery.  Prior  to his bypass, The patient has had class 3 heart failure and at the time of  bypass he had an LV epicardial lead placed despite having right bundle  branch block.  Subsequently, he has improved.  His ejection fraction is now  20 to 25%.  His heart failure is class 1 to 2.  He is now referred for  implantable cardioverter-defibrillator  insertion.  Of note the patient has  a history of nonsustained ventricular tachycardia with negative  electrophysiologic study after bypass surgery three months ago.   DESCRIPTION OF PROCEDURE:  After informed consent was obtained, the patient  was taken to the diagnostic electrophysiology laboratory in the fasted  state.  After the usual preparation and draping, a total of 30mL of  lidocaine was infiltrated into the left infraclavicular region.  A 9 cm  incision was carried out over this region and electrocautery utilized to  dissect down to the fascial plane.  10mL of contrast demonstrated a patent  left subclavian vein.  It was subsequently punctured with the Medtronic  Sprint Quattro secure model  6947 58 cm defibrillation lead, serial number  [id number] advanced into the right ventricle.  At the RV apex, the R waves  measured 9mV through the analyzer with a pacing threshold of 0.6V at 0.66ms  once the lead was actually fixed.  The pacing impedance was 495 ohms.  10V  pacing did not stimulate the diaphragm.  The lead was secured to the  subpectoralis fascia with a figure-of-eight silk suture. In addition, the  sewing sleeve was secured with silk suture.  At this point electrocautery  was utilized to assure hemostasis and make a subcutaneous pocket.  Kanamycin  irrigation was utilized to irrigate the pocket and the Medtronic VR model  7232 single chamber defibrillator, serial number [id number] was connected to  the defibrillation lead and placed in the subcutaneous pocket.  The  generator was secured with silk suture.  Additional Kanamycin was utilized  to irrigate the pocket and defibrillation threshold testing  carried out.  After the patient was more deeply sedated with fentanyl and Versed,  ventricular fibrillation was induced with a T wave shock and a 15 joule  shock terminated ventricular fibrillation restoring sinus rhythm.  Five  minutes was allowed to elapse and a second DFT test carried out.  Again  ventricular fibrillation was induced with a T-wave shock and a 10 joule  shock terminated ventricular fibrillation and restoring sinus rhythm.  At  this point no additional defibrillation threshold testing was carried out  and the incision was closed with a layer of 2-0 Vicryl followed by a layer  of 3-0 Vicryl followed by a layer of 4-0 Vicryl.  Benzoin was painted on the  skin and Steri-Strips were applied and a pressure dressing placed.  Patient  returned to his room in satisfactory condition.   COMPLICATIONS:  There were no immediate procedural complications.    RESULTS:  This demonstrates successful implantation of a Medtronic single  chamber defibrillator in a patient with  an ischemic cardiomyopathy, a wide  QRS, status post myocardial infarction now four months ago with a history of  nonsustained ventricular tachycardia.                                               Doylene Canning. Ladona Ridgel, M.D.    GWT/MEDQ  D:  07/17/2003  T:  07/18/2003  Job:  [id number]   cc:   Rollene Rotunda, M.D.   Dr. Sharma Covert, R.N. [facility]

## 2011-01-21 NOTE — Discharge Summary (Signed)
NAME:  [name], [name]                         ACCOUNT NO.:  [id number]   MEDICAL RECORD NO.:  [id number]                   PATIENT TYPE:  INP   LOCATION:  2040                                 FACILITY:  [facility]   PHYSICIAN:  Evelene Croon, M.D.                  DATE OF BIRTH:  27-Sep-1926   DATE OF ADMISSION:  03/10/2003  DATE OF DISCHARGE:  03/20/2003                                 DISCHARGE SUMMARY   ADMISSION DIAGNOSES:  1. Evolving inferior wall myocardial infarction.  2. Bradycardia with first-degree atrioventricular block and right bundle-     branch block.   PAST MEDICAL HISTORY:  Remarkable only for:  1. A ventral hernia repair.  2. A cholecystectomy.  3. A small-bowel obstruction.   ALLERGIES:  He has no known drug allergies.   DISCHARGE DIAGNOSES:  1. Severe three-vessel coronary artery disease with severe left ventricular     dysfunction.  2. Diabetes mellitus type 2.   BRIEF HISTORY:  [name] is a 77 year old African-American man.  He had  no prior cardiac history and is quite active, walking daily and playing  golf.  On the Sunday prior to admission, he developed waxing and waning  chest pain.  Symptoms continued, and so he presented to the emergency  department at [facility].  On arrival, his electrocardiogram  revealed an evolving inferior MI with positive troponin levels.   HOSPITAL COURSE:  On arrival at [facility] emergency department, he was  evaluated by [facility] Cardiology service, Dr. Sherryl Manges; Dr. Odessa Fleming  impression was that of an evolving inferior wall MI, and his recommendation  was admission to the hospital, serial enzymes, Lovenox, Integrilin, aspirin,  and Plavix.  He also recommended proceeding with cardiac catheterization as  well as checking his hemoglobin A1c; hemoglobin A1c was noted to be 7.6.   [name] underwent a cardiac catheterization on March 11, 2003; Dr. Juanda Chance  performed this catheterization, and it revealed  severe three-vessel coronary  artery disease as well as left ventricular ejection fraction of  approximately 15% with severe anteroapical hypokinesis and akinesis of the  inferior wall.  There was 1 to 2+ mitral regurgitation.  He subsequently  underwent an echocardiogram, which showed an LV ejection fraction of 20 to  30%, mild mitral regurg, and no aortic stenosis regurgitation.   As his lesions were not amenable to PCI, cardiac surgery consult was  requested; he was evaluated by Dr. Evelene Croon on March 12, 2003.  After  examination of the patient, review of his available records, Dr. Laneta Simmers  agreed that coronary artery bypass graft was the preferred treatment of  choice of this gentleman.  He discussed the procedure risks and benefits,  including that his operative risks were certainly increased due to his  severe left ventricular dysfunction.  All of [name] questions were  answered, and he agreed to proceed with surgery.  In addition to the bypass procedure, Dr. Graciela Husbands has recommended placing  permanent echocardioventricular pacing leads because of [name]  relatively slow heart rate and some junctional cardiac rhythm.  Surgery was  tentatively planned for March 14, 2003.   Preoperative arterial evaluation:  Carotid study revealed no significant  carotid artery disease.  Lower extremity study revealed ABIs to be greater  than 1.0 bilaterally; however, he was noted to have calcific vessels, and  the ABIs may be abnormality elevated.  [name] remained stable while in  the hospital prior to his surgery.   On March 14, 2003, [name] underwent the following surgical procedures:  (1)  Coronary artery bypass grafting times five.  Grafts at the time of  procedure, left internal mammary artery graft to the left anterior  descending, saphenous vein graft to the diagonal artery, saphenous vein  graft to the sequentials - to the first and second intermediate branches,   saphenous vein graft to the posterior descending artery.  A vein was  harvested from the right leg with __________ harvesting technique as well as  the right proximal leg with open surgical technique.  (2)  Biventricular  echocardiopacing leads; two left ventricular leads were placed on the  lateral wall.  There was excellent sensitivity and threshold data while in  the operating room.  He tolerated the procedure well, transferring in stable  condition to the SICU.  He remained hemodynamically stable in the immediate  perioperative period.  He was extubated several hours after arrival in the  ICU.   [name] postoperative course has been uneventful.  He is making very  good progress and recovering from his surgery.  His CBGs have remained  consistently elevated; he was started on an oral agent (Amaryl 2 mg daily)  on postoperative day three.  He has also had some basic diabetes education  regarding diet while here in the hospital, and he will be referred to the  diabetes management outpatient teaching as well.  Because of his right  bundle-branch block as well as QRS greater than 140 preoperatively, Dr.  Graciela Husbands recommended proceeding with an electrophysiology study prior to  discharge; this was performed today, March 19, 2003, and was negative for any  inducible ventricular tachycardia.   This morning, March 19, 2003, his postoperative day five, [name] reports  feeling very well, his vital signs are stable, his blood pressure 122/70, he  is afebrile, his room-air saturation 96%.  His heart is maintaining a normal  sinus rhythm at 79 beats per minute.  His lungs are clear.  He has no GI or  GU complaints.  His bowel and bladder functions are within normal limits.  His incisions are healing very well.  He is ambulating independently in his  room.  He is participating with cardiac rehab phase 1 enthusiastically.  His pain control is adequate.  If [name] continues to progress in  this  manner, we anticipate he will be ready for discharge home tomorrow, March 20, 2003.   LABORATORY STUDIES:  March 17, 2003, CBC, white blood cells 7.1, hemoglobin  9.5, hematocrit 26.8, platelets 129.  March 17, 2003, chemistries included  sodium of 135, potassium 4.1, chloride 106, CO2 of 23, BUN 25, creatinine  1.2, glucose 134, and calcium 8.2.   CONDITION ON DISCHARGE:  Improved.   DISCHARGE INSTRUCTIONS:   DISCHARGE MEDICATIONS:  1. Toprol-XL 25 mg p.o. daily.  2. Lasix 40 mg p.o. daily.  3. Potassium chloride  20 mEq p.o. daily.  4. Amaryl 2 mg p.o. daily.  Also been instructed to check his blood sugar     daily and record.  5. Altace 5 mg p.o. daily.   PAIN MANAGEMENT:  He may have Ultram 50 mg one to two p.o. q.6h. p.r.n.   ACTIVITY:  1. He has been asked to refrain from any driving or any heavy lifting,     pushing, or pulling.  2. He has also been instructed to continue his breathing exercises and daily     walking.   DIET:  His diet should be a carbohydrate-modified, diabetic diet.   WOUND CARE:  1. He may shower daily with mild soap and water.  2. If incisions show any signs of infection or if he has a fever of greater     than 101 degrees Fahrenheit, he is to call Dr. Sharee Pimple office.   SPECIAL INSTRUCTIONS:  1. Mr. Farabee lives alone in an apartment; his sister is currently     arranging 24-hour care for the next week.  2. Mr. Remmel has also been provided with a prescription for a home blood     glucose meter, which he will fill at his pharmacy; he will be instructed     on the glucose meter use.   FOLLOW UP:  1. He should be seen in [facility] Card[facility] office in approximately two     weeks; he will have an x-ray taken at that time.  An appointment will be     arranged prior to his discharge.  2. Dr. Laneta Simmers would like to see him in [facility] office on Tuesday, April 08, 2003, at 11:30 in the morning.  3. He is to follow up with Dr. Bruna Potter in  approximately one month,     specifically regarding his diabetes.     Toribio Harbour, N.P.                  Evelene Croon, M.D.    CTK/MEDQ  D:  03/19/2003  T:  03/20/2003  Job:  [id number]   cc:   Evelene Croon, M.D.  [address]  [place]  [place] [address]  Fax: [phone]   [facility], Cardiology Office   Dr. Bruna Potter    cc:   Evelene Croon, M.D.  [address]  [place]  [place] [address]  Fax: [phone]   [facility], Cardiology Office   Dr. Bruna Potter

## 2011-01-21 NOTE — H&P (Signed)
NAME:  KYCE, GING                         ACCOUNT NO.:  [id number]   MEDICAL RECORD NO.:  [id number]                   PATIENT TYPE:  INP   LOCATION:  1824                                 FACILITY:  [facility]   PHYSICIAN:  Duke Salvia, M.D.               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  It was accompanied by some diaphoresis.  It  otherwise did not radiate and was not associated with shortness of breath or  nausea.  Because of the recurrences over the last 24 hours, he presented to  the emergency room this morning where electrocardiogram demonstrated  evolving inferior MI with positive troponins.   His cardiac risk factors are notable for family history, but otherwise  negative for hypertension, hypercholesterolemia. He has rare smokeless  tobacco use and he does have a history of in the distant past of borderline  diabetes.   PAST SURGICAL HISTORY:  Notable for ventral hernia and cholecystectomy and  small bowel obstruction.   MEDICATIONS:  Aspirin a day.   ALLERGIES:  No known drug allergies.   SOCIAL HISTORY:  He is divorced. He has two grown children.  He lives in  [place] by himself.   REVIEW OF SYSTEMS:  As noted on the intake sheet and is not further  recounted at this time.   PHYSICAL EXAMINATION:  GENERAL: The patient is an elderly African-American  male in no acute distress.  VITAL SIGNS: His blood pressure is 109/54, pulse 46 and regular,  respirations 16 and unlabored.  HEENT:  No icterus or xanthomata.  Neck veins were flat. Carotids were brisk  and full bilaterally without bruits.  BACK:  No kyphosis or scoliosis.  LUNGS:  Clear.  HEART:  Sounds regular without murmurs or  gallops.  ABDOMEN:  Soft with active bowel sounds. There was a ventral hernia.  EXTREMITIES: Distal pulses were intact. There is no cyanosis, clubbing, or  edema.  SKIN:  Warm and dry.  NEUROLOGY:  Grossly normal.   EKG demonstrated sinus rhythm with occasional PVC's at a rate of 72.  Intervals were 0.28/0.15/0.43 with an axis that was leftward at -30 degrees  with a right bundle branch block.  There was evidence of ST segment  elevation in leads 2 and 3 with ST segment depression in the lateral  precordium.   IMPRESSION:  1. Evolving inferior wall myocardial infarction out of the hospital.  2. Bradycardia with first degree AV block and right bundle branch/left     anterior fascicular block.   The patient has an out of hospital infarct in the context of otherwise being  quite stable. We will plan to:  1) Admit.  2) Serial enzymes.  3) Lovenox  and 2B3A's with Integrilin.  4) Aspirin and Plavix.  5) Catheterization on  Wednesday.  6) Check his hemoglobin A1C.  7) Begin statin.                                               Duke Salvia, M.D.    SCK/MEDQ  D:  03/10/2003  T:  03/10/2003  Job:  [id number]   cc:   Clyda Greener, M.D.    cc:   Clyda Greener, M.D.

## 2011-01-21 NOTE — Cardiovascular Report (Signed)
NAME:  [name], [name]                         ACCOUNT NO.:  [id number]   MEDICAL RECORD NO.:  [id number]                   PATIENT TYPE:  INP   LOCATION:  2029                                 FACILITY:  [facility]   PHYSICIAN:  Charlies Constable, M.D.                  DATE OF BIRTH:  07-14-1927   DATE OF PROCEDURE:  03/11/2003  DATE OF DISCHARGE:                              CARDIAC CATHETERIZATION   CLINICAL HISTORY:  The patient is 77 years old and has no prior history of  known heart disease.  He has been in good health and has only been on  aspirin and Aleve.  He was admitted yesterday by Duke Salvia, M.D. with  chest pain, EKG changes, and diaphragmatic wall infarction but he presented  late and was felt to have had completed his infarction so he was not taken  to the laboratory.  He was put on Integrilin and heparin.   PROCEDURE:  Left heart catheterization was performed percutaneously through  the right femoral artery using arterial sheath and 6-French preformed  coronary catheters.  Right heart catheterization was performed  percutaneously through the right femoral vein using a medium sheath and Swan-  Ganz thermodilution catheter.  A distal aortogram was performed to rule out  abdominal aortic aneurysm.  We attempted to inject the subclavian artery,  but had difficulty selectively entering this so we did not do this.  The  patient tolerated the procedure well and left the laboratory in satisfactory  condition.   RESULTS:  Left main coronary artery:  Free of significant disease.   Left anterior descending artery:  Gave rise to a moderate sized diagonal  branch and four septal perforators.  There was 70% narrowing before the  diagonal branch and 90% narrowing in the mid LAD.  There was 95% narrowing  in the first diagonal branch which was a small caliber vessel.   Circumflex artery:  The circumflex artery gave rise to a large intermediate  branch and an AV branch.  The AV  branch had 80% ostial and 70% stenosis in  its mid portion before it terminated in the posterior descending branch.  The large intermediate branch gave rise to two subbranches.  There was a 50%  narrowing in a proximal portion in the intermediate branch and tandem 90%  stenosis in the first large subbranch.   Right coronary artery:  The right coronary artery was completely occluded at  its mid portion.  The distal right coronary artery filled partly by a  bridging collateral from right ventricular branches and partly via  collaterals from the circumflex artery.  The inferior wall vessels appear to  be small in caliber.   LEFT VENTRICULOGRAM:  The left ventriculogram performed in the RAO  projection showed severe hypokinesis of the anterolateral wall and apex and  akinesis of the inferior wall.  The estimated ejection fraction was 15%.  There was 1-2+ mitral regurgitation.   DISTAL AORTOGRAM:  A distal aortogram was performed which showed patent  renal arteries and no significant aortoiliac obstruction.   HEMODYNAMIC DATA:  Right atrial pressure was 7 mean.  Pulmonary artery  pressure was 36/18 with a mean of 25.  Pulmonary wedge pressure was 18 mean.  Left ventricular pressure was 140/27.  The aortic pressure was 140/86 with a  mean of 110.  The cardiac output/cardiac index was 3.2/1.8 L/minute/sq m by  Fick.   CONCLUSIONS:  Status post recent out of hospital diaphragmatic wall  infarction with severe three vessel disease with total occlusion of the  right coronary artery, 70 and 90% stenoses in the proximal and mid LAD with  95% stenosis in the first diagonal branch of the LAD, 80% ostial and 80% mid  stenosis in the circumflex artery with 90% stenosis in the subbranch in the  intermediate branch of the circumflex artery, and severe left ventricular  dysfunction with anterolateral wall and apical wall hypokinesis and inferior  wall akinesis and estimated ejection fraction 15%.    RECOMMENDATIONS:  I think the patient can best be treated with bypass  surgery, although risks will be increased due to his severe left ventricular  dysfunction and some of his target vessels are not ideal targets.  Evelene Croon, M.D. has been called to see him in consultation.                                               Charlies Constable, M.D.    BB/MEDQ  D:  03/11/2003  T:  03/12/2003  Job:  [id number]  Bruna Potter, M.D.   Cardiopulmonary Lab   cc:   Bruna Potter, M.D.   Cardiopulmonary Lab

## 2011-01-21 NOTE — Op Note (Signed)
NAME:  [name], [name]                         ACCOUNT NO.:  [id number]   MEDICAL RECORD NO.:  [id number]                   PATIENT TYPE:  INP   LOCATION:  2040                                 FACILITY:  [facility]   PHYSICIAN:  Doylene Canning. Ladona Ridgel, M.D.               DATE OF BIRTH:  1926-09-28   DATE OF PROCEDURE:  03/19/2003  DATE OF DISCHARGE:                                 OPERATIVE REPORT   PROCEDURE PERFORMED:  Invasive electrophysiologic testing.   INDICATIONS FOR PROCEDURE:  Ischemic cardiomyopathy, status post bypass  surgery with nonsustained ventricular tachycardia and ejection fraction of  15 to 20.   HISTORY OF PRESENT ILLNESS:  The patient is a very pleasant 77 year old man  with a history of ischemic cardiomyopathy and severe left ventricular  dysfunction who is status post recent bypass surgery.  Postoperatively he  had large amounts of nonsustained ventricular tachycardia and because of  severe ischemic cardiomyopathy, he is referred for invasive  electrophysiologic testing and possible implantable cardioverter-  defibrillator insertion if his electrophysiologic testing is positive.   DESCRIPTION OF PROCEDURE:  After informed consent was obtained, the patient  was taken to the diagnostic electrophysiology laboratory in a fasted state.  After the usual preparation and draping, intravenous fentanyl and Midazolam  were given for sedation.  A 5 French quadripolar catheter was inserted  percutaneously in the right femoral vein and advanced to the His bundle  region. A 5 French quadripolar catheter was inserted percutaneously in the  right femoral vein and advanced to the RV apex.  After measuring the basic  intervals, rapid ventricular pacing was carried out from the RV apex at a  pacing cycle length of 600 msec.  This demonstrated VA dissociation at  baseline. Next, programmed ventricular stimulation was carried out from both  the RV apex as well as the RV outflow tract and  basic drive cycle length of  161 and 400 msec.  S1-S2, S1-S2-S3, and S1-S2-S3-S4 stimuli were delivered  with the S1-S2, S2-S3, and S3-S4 intervals stepwise decreased down to  ventricular refractoriness.  During programmed ventricular stimulation,  there was no inducible ventricular tachycardia.  The catheters were then  removed, hemostasis assured and the patient returned to his room in  satisfactory condition.   COMPLICATIONS:  There were no immediate procedure complications.   RESULTS:  1. Baseline electrocardiogram.  The baseline ECG demonstrates normal sinus     rhythm with prior anterior myocardial infarction as well as prior     evidence of inferior myocardial infarction.  2. Baseline intervals.  __________ cycle length was 950 msec.  The QRS     duration 157 msec.  The HV interval 48 msec.  3. Rapid ventricular pacing.  Rapid ventricular pacing was carried out from     the RV apex demonstrating VA dissociation at 600 msec.  4. Programmed  ventricular stimulation.  Programmed ventricular stimulation  was carried out from the RV apex as well as the RV outflow tract at basic     drive cycle length of 130 and 400 msec.  S1-S2, S1-S2-S3, and S1-S2-S3-S4     stimuli were delivered with the S1-S2, S2-S3, and S3-S4 interval stepwise     decreased down to ventricular refractoriness.  During programmed     ventricular stimulation, there was no inducible VT.    CONCLUSION:  This study demonstrates no evidence of inducible sustained  monomorphic ventricular tachycardia in a patient with ischemic  cardiomyopathy, status post myocardial infarction with nonsustained  ventricular tachycardia.  He will be followed carefully with careful medical  therapy and have a repeat 2-D electrocardiogram in several months to see if  his LV function is improved post bypass surgery.                                                Doylene Canning. Ladona Ridgel, M.D.    GWT/MEDQ  D:  03/19/2003  T:  03/19/2003   Job:  [id number]   cc:   Charlies Constable, M.D.   Dr. Bruna Potter

## 2011-01-21 NOTE — Assessment & Plan Note (Signed)
[facility]                           ELECTROPHYSIOLOGY OFFICE NOTE   NAME:Kliewer, VIREN LEBEAU                      MRN:          [id number]  DATE:06/08/2006                            DOB:          [birth year]    DEFIBRILLATOR NOTE:  Mr. Coia was seen today in the clinic on June 08, 2006 for followup of his Medtronic Model Number 7232 Maximo.  Date of  implant was July 17, 2003 for ischemic cardiomyopathy.  On interrogation  of his device today his battery voltage is 3.13 with a charge time of 7.74  seconds.  R waves measured 2.9 by programmer, manually was 4-5 millivolts  with a ventricular capture threshold of 1 volt at 0.3 msec and a ventricular  lead impedance of 364.  Shock impedance was 41.  There was no episodes since  last interrogation.  No changes were made in his parameters.  He will send a  [facility] transmission in at 84, 6 and 9 months' time with a return office  visit in 1 year.      ______________________________  Altha Harm, LPN    ______________________________  Doylene Canning. Ladona Ridgel, MD    PO/MedQ  DD:  06/08/2006  DT:  06/09/2006  Job #:  [id number]

## 2011-01-21 NOTE — Consult Note (Signed)
NAME:  [name], [name]                         ACCOUNT NO.:  [id number]   MEDICAL RECORD NO.:  [id number]                   PATIENT TYPE:  INP   LOCATION:  2029                                 FACILITY:  [facility]   PHYSICIAN:  Evelene Croon, M.D.                  DATE OF BIRTH:  20-Jun-1927   DATE OF CONSULTATION:  03/12/2003  DATE OF DISCHARGE:                                   CONSULTATION   REASON FOR CONSULTATION:  Severe three-vessel coronary artery disease with  severe left ventricular dysfunction.   HISTORY OF PRESENT ILLNESS:  This patient is a 77 year old black male with  no prior cardiac history who stays quite active, playing golf.  This past  Sunday, he developed waxing and waning chest pain.  He had planned on going  to play golf but decided against it because he did not feel well.  His  symptoms continued, and he also developed some diaphoresis.  These symptoms  lasted for about 24 hours, and he presented to the emergency room where an  electrocardiogram showed an evolving inferior MI with positive troponin  levels.   Cardiac catheterization was performed by Dr. Juanda Chance on March 11, 2003; this  showed severe three-vessel coronary disease.  The LAD had about 70% proximal  stenosis and about 90% mid-vessel stenosis.  There was a 95% diagonal  stenosis.  There was a large intermediate vessel that was 50% stenosed  proximally.  There was a first sub branch of this that had segmental 90%  stenoses.  The AV groove portion of the left circumflex had about 80%  proximal and 70% mid stenosis.  The right coronary artery was occluded  proximally with filling of the distal vessel by collaterals.  The left  ventricular ejection fraction was about 15% with severe anteroapical  hypokinesis and akinesis of the inferior wall.  There was 1 to 2+ mitral  regurgitation.   He subsequently underwent an echocardiogram, which showed a left ventricular  ejection fraction of 20 to 30% with mild,  diffuse left ventricular  hypokinesis, akinesis of the entire inferoposterior wall, and severe  hypokinesis of the entire lateral wall.  There was mild mitral  regurgitation.  There was no aortic stenosis or regurgitation.  His right  heart pressures were normal during catheterization.  Right atrial pressure  was 7.  PA pressure was 36/18 with a wedge of 18.  Cardiac index was 1.8.   REVIEW OF SYSTEMS:  GENERAL:  He denies fever or chills.  He has had no  recent weight changes.  ENT:  Negative.  ENDOCRINE:  He denies diabetes and  hypothyroidism.  CARDIOVASCULAR:  He denies any prior chest pain until  recently.  He says that he walks nine holes of golf without any symptoms.  He denies any shortness of breath.  He has had one episode of lower  extremity edema that was several months  ago and resolved very quickly on its  own.  He denies palpitations.  He denies PND and orthopnea.  RESPIRATORY:  He denies cough and sputum production.  GI:  He has had no nausea or  vomiting.  He denies melena and bright red blood per rectum.  GU:  He denies  dysuria and hematuria.  MUSCULOSKELETAL:  He denies arthralgias and  myalgias.  NEUROLOGICAL:  He has had no focal weakness or numbness.  He  denies dizziness and syncope.  PSYCHIATRIC:  Negative.  SKIN:  Negative.   ALLERGIES:  None.   MEDICATIONS PRIOR TO ADMISSION:  Aspirin daily.   PAST MEDICAL HISTORY:  Significant for:  1. Multiple abdominal surgeries for what he says was ruptured bowel followed     by small-bowel obstruction.  2. He is status post a cholecystectomy.  3. He has large ventral incisional hernias following all that surgery.  He     has been seen by a general surgeon for that problem, and he said that he     was told that if these were not causing him any trouble, he should leave     them alone.  4. He has been told he has borderline diabetes.   SOCIAL HISTORY:  He is retired and lives in [place].  He denies ethanol  abuse.   He has used smokeless tobacco on occasion.   FAMILY HISTORY:  He has had three brothers die with cardiac disease in their  56s.  His mother had end-stage renal disease and hypertension.   PHYSICAL EXAMINATION:  VITAL SIGNS:  His blood pressure is 105/60, and his  pulse is 57 and irregular.  GENERAL:  He is a thin black male in no distress.  HEENT EXAM:  Shows him to be normocephalic and atraumatic.  The pupils are  equal and reactive.  There is some cloudiness of the left cornea.  Extraocular muscles are intact.  His throat is clear.  NECK EXAM:  Shows normal carotid pulses bilaterally.  There are no bruits.  There is no adenopathy or thyromegaly.  LUNG EXAM:  Clear.  ABDOMINAL EXAM:  Shows active bowel sounds.  His abdomen is very soft and  deformed due to large abdominal wall hernias.  There are no palpable masses  or organomegaly.  EXTREMITY EXAM:  Shows no peripheral edema.  Pedal pulses are palpable  bilaterally.  SKIN:  Warm and dry.  NEUROLOGIC EXAM:  Shows him to be alert and oriented times three.  Motor and  sensory exams are grossly normal.   LABORATORY DATA:  Carotid Doppler examination shows no evidence of internal  carotid artery stenosis.  His ABIs are greater than 1 bilaterally.  Laboratory tests show normal electrolytes with a glucose of 122, BUN of 14,  and creatinine of 1.0.  His white blood cell count is low at 3.2 with a  hemoglobin of 12.3 and a platelet count of 114,000.  Coagulation profile is  normal.  Chest x-ray shows mild cardiomegaly with clear lungs.  Electrocardiogram shows normal sinus rhythm with first-degree AV block and  right bundle-branch block.  There is left ventricular hypertrophy with QRS  widening.   IMPRESSION/PLAN:  This patient has severe three-vessel coronary disease with  severe left ventricular dysfunction, presenting with acute inferior  myocardial infarction.  I agree that coronary artery bypass graft surgery is the best treatment  to prevent further ischemia and infarction.  His  operative risks are certainly increased due to his severe left ventricular  dysfunction and less than ideal distal vessels.  I think surgery will also  be more complicated due to his abdominal wall hernias, which impact on the  lower end of the sternotomy incision.  He has been seen by Dr. Graciela Husbands of  electrophysiology due to a relatively slow heart rate and some junctional  rhythm, and Dr. Graciela Husbands feels that placement of permanent epicardial  ventricular pacing leads at the time of his surgery would be wise.  I  discussed the operative procedure of coronary artery bypass surgery with  him, including insertion of permanent epicardial pacing leads.  I discussed  alternatives to surgery, benefits, and risks, including bleeding, blood  transfusion, infection, stroke, myocardial infarction, possible use of an  inferior balloon pump or LV assist device, and death; he understands and  agrees to proceed.  We will plan to do surgery on March 14, 2003.                                              Evelene Croon, M.D.   BB/MEDQ  D:  03/13/2003  T:  03/14/2003  Job:  [id number]   cc:   Charlies Constable, M.D.

## 2011-02-14 ENCOUNTER — Encounter (HOSPITAL_COMMUNITY)
Admission: RE | Admit: 2011-02-14 | Discharge: 2011-02-14 | Disposition: A | Discharge status: Home / Self Care | Payer: Medicare Other | Source: Ambulatory Visit | Attending: Ophthalmology | Admitting: Ophthalmology

## 2011-02-16 ENCOUNTER — Ambulatory Visit (HOSPITAL_COMMUNITY)
Admission: RE | Admit: 2011-02-16 | Discharge: 2011-02-16 | Disposition: A | Discharge status: Home / Self Care | Payer: Medicare Other | Source: Ambulatory Visit | Attending: Ophthalmology | Admitting: Ophthalmology

## 2011-02-16 DIAGNOSIS — Z538 Procedure and treatment not carried out for other reasons: Secondary | ICD-10-CM | POA: Insufficient documentation

## 2011-02-16 DIAGNOSIS — H11009 Unspecified pterygium of unspecified eye: Secondary | ICD-10-CM | POA: Insufficient documentation

## 2011-03-06 HISTORY — PX: CARDIAC CATHETERIZATION: SHX172

## 2011-03-08 ENCOUNTER — Inpatient Hospital Stay (HOSPITAL_BASED_OUTPATIENT_CLINIC_OR_DEPARTMENT_OTHER)
Admission: RE | Admit: 2011-03-08 | Discharge: 2011-03-08 | Disposition: A | Discharge status: Home / Self Care | Payer: Medicare Other | Source: Ambulatory Visit | Attending: Cardiology | Admitting: Cardiology

## 2011-03-08 DIAGNOSIS — I2510 Atherosclerotic heart disease of native coronary artery without angina pectoris: Secondary | ICD-10-CM | POA: Insufficient documentation

## 2011-03-08 DIAGNOSIS — I10 Essential (primary) hypertension: Secondary | ICD-10-CM | POA: Insufficient documentation

## 2011-03-08 DIAGNOSIS — R0609 Other forms of dyspnea: Secondary | ICD-10-CM | POA: Insufficient documentation

## 2011-03-08 DIAGNOSIS — I2589 Other forms of chronic ischemic heart disease: Secondary | ICD-10-CM | POA: Insufficient documentation

## 2011-03-08 DIAGNOSIS — E7800 Pure hypercholesterolemia, unspecified: Secondary | ICD-10-CM | POA: Insufficient documentation

## 2011-03-08 DIAGNOSIS — I509 Heart failure, unspecified: Secondary | ICD-10-CM | POA: Insufficient documentation

## 2011-03-08 DIAGNOSIS — I25810 Atherosclerosis of coronary artery bypass graft(s) without angina pectoris: Secondary | ICD-10-CM | POA: Insufficient documentation

## 2011-03-08 DIAGNOSIS — Z95810 Presence of automatic (implantable) cardiac defibrillator: Secondary | ICD-10-CM | POA: Insufficient documentation

## 2011-03-08 DIAGNOSIS — I252 Old myocardial infarction: Secondary | ICD-10-CM | POA: Insufficient documentation

## 2011-03-08 DIAGNOSIS — R0989 Other specified symptoms and signs involving the circulatory and respiratory systems: Secondary | ICD-10-CM | POA: Insufficient documentation

## 2011-03-08 DIAGNOSIS — M109 Gout, unspecified: Secondary | ICD-10-CM | POA: Insufficient documentation

## 2011-03-08 DIAGNOSIS — I209 Angina pectoris, unspecified: Secondary | ICD-10-CM | POA: Insufficient documentation

## 2011-03-08 DIAGNOSIS — I2582 Chronic total occlusion of coronary artery: Secondary | ICD-10-CM | POA: Insufficient documentation

## 2011-03-23 NOTE — Cardiovascular Report (Signed)
NAMEJAYLEE, LANTRY NO.:  [id number]  MEDICAL RECORD NO.:  [id number]  LOCATION:                                 FACILITY:  PHYSICIAN:  Alyzza Andringa N. Sharyn Lull, M.D. DATE OF BIRTH:  05/14/1927  DATE OF PROCEDURE:  03/08/2011 DATE OF DISCHARGE:                           CARDIAC CATHETERIZATION   PROCEDURES:  Left cardiac catheterization with selective left and right coronary angiography, left ventriculography, visualization of saphenous vein graft and left internal mammary artery graft via right groin using Judkins technique.  INDICATIONS FOR PROCEDURE:  Mr. Defenbaugh is an 77 year old black male with past medical history significant for coronary artery disease, history of inferior wall MI in 1994, status post CABG in 1994.  He had LIMA to LAD, sequential saphenous vein graft to OM-1 and OM-2, saphenous vein graft to PDA, and saphenous vein graft to high diagonal 1/ramus, history of congestive heart failure, ischemic cardiomyopathy status post ICD, hypertension, and hypercholesteremia.  He complains of exertional dyspnea with minimal exertion associated with feeling weak and tired. He states he used to play 18-hole golf without any problems, but he gets tired and short of breath after playing 5 holes.  Denies any chest pain, nausea, or vomiting.  Denies any palpitation, lightheadedness, or syncope.  Denies any ICD discharges.  Denies PND, orthopnea, or leg swelling.  Denies any rest or nocturnal angina.  Denies cough, fever, or chills.  Denies any weakness in the arms or legs.  Denies any claudication pain.  PAST MEDICAL HISTORY:  As above plus also history of gouty arthritis.  PAST SURGICAL HISTORY:  He had ruptured bowel at age of 74, had CABG in 1994 as above, had cholecystectomy in the past.  ALLERGIES:  No known drug allergies.  MEDICATIONS:  At home, he is on. 1. Bisoprolol. 2. Enteric-coated aspirin 81 mg p.o. daily. 3. Plavix 75 mg daily which was  recently started. 4. Crestor 10 mg p.o. daily. 5. Pepcid 20 mg p.o. b.i.d. which were also recently started.  SOCIAL HISTORY:  He is divorced, has 2 children.  No history of smoking. Drinks occasionally socially.  Worked as Scientist, physiological in the past.  FAMILY HISTORY:  Noncontributory.  PHYSICAL EXAMINATION:  GENERAL:  He was alert, awake, and oriented x3 in no acute distress. VITAL SIGNS:  Blood pressure was 120/70.  Pulse was 57, regular. HEENT:  Conjunctivae were pink. NECK:  Supple.  No JVD.  No bruit. LUNGS:  Clear to auscultation without rhonchi or rales. CARDIOVASCULAR:  S1 and S2 were normal.  There was soft systolic murmur. ABDOMEN:  Soft, bowel sounds were present, nontender. EXTREMITIES:  There is no clubbing, cyanosis, or edema.  IMPRESSION:  Exertional dyspnea, weakness, probably angina equivalent, coronary artery disease, history of inferior wall myocardial infarction, status post coronary artery bypass grafting in the past, ischemic cardiomyopathy, history of recurrent congestive heart failure, hypertension, hypercholesteremia, and gouty arthritis.  I discussed with the patient regarding noninvasive stress testing versus left cath, its risks and benefits, i.e., death, MI, stroke, local vascular complications, etc. and consented for left cath.  PROCEDURE:  After obtaining informed consent, the patient was brought to the Cath Lab and was placed on  the fluoroscopy table.  The right groin was prepped and draped in the usual fashion.  Xylocaine 1% was used for local anesthesia in the right groin.  With the help of thin-wall needle, a 4-French arterial sheath was placed.  Sheath was aspirated and flushed.  Next, 4-French left Judkins catheter was advanced over the wire under fluoroscopic guidance up to the ascending aorta.  Wire was pulled out.  The catheter was aspirated and connected to the manifold. Catheter was further advanced and engaged into left coronary  ostium. Multiple views of the left system were taken.  Next, the catheter was disengaged and was pulled out over the wire and was placed with a 4- [place] 3-D diagnostic catheter, which was advanced over the wire under fluoroscopic guidance up to the ascending aorta.  Wire was pulled out. The catheter was aspirated and connected to the manifold.  Catheter was further advanced and engaged into right coronary ostium.  The single view of right coronary artery was obtained.  Next, this catheter was engaged into saphenous vein graft to PDA.  Multiple views of this graft were taken.  Next, the catheter was disengaged and was pulled out over the wire and was replaced with 4-French left bypass diagnostic catheter, which was advanced over the wire under fluoroscopic guidance up to the ascending aorta.  Wire was pulled out.  The catheter was aspirated and connected to the manifold.  Catheter was further advanced and engaged into saphenous vein graft to ramus/diagonal.  Multiple views of this graft were taken.  Next, the catheter was disengaged and was engaged into sequential saphenous vein graft to OM-1 and OM-2.  Multiple views of this graft were taken.  Next, catheter was disengaged and was pulled out over the wire and was replaced with a 4-French right diagnostic Judkins catheter which was advanced over the wire up to the ascending aorta.  Wire was pulled out.  The catheter was aspirated and connected to the manifold.  Catheter was further advanced into the subclavian artery.  Catheter could not be advanced up to the IM.  Nonselective injection of IM was done.  Multiple views of this graft were taken. Next, catheter was disengaged and was pulled out over the wire and was replaced with 4-French pigtail catheter, which was advanced over the wire under fluoroscopic guidance up to the ascending aorta.  Wire was pulled out.  The catheter was aspirated and connected to the manifold. Catheter was  further advanced across the aortic valve into the LV.  LV pressures were recorded.  Next, LV-graphy was done in 30-degree RAO position.  Post-angiographic pressures were recorded from LV and then pullback pressures were recorded from the aorta.  There was no gradient across the aortic valve.  Next, the pigtail catheter was pulled out over the wire.  Sheaths were aspirated and flushed.  FINDINGS:  LV showed LV was moderately enlarged.  There was severe global hypokinesia, EF of 20-25%.  Left main has 10-15% distal stenosis. LAD has 90-95% sequential.  Proximal LAD has 80-95% sequential proximal stenosis and then 100% occluded beyond midportion which was filling by LIMA.  Ramus has 40% ostial stenosis and 60-70% mid stenosis distally, was filling by saphenous vein graft.  Left circumflex has 80-85% ostial and proximal sequential stenosis and then it is 100% occluded.  OM-1 has 80-85% ostial stenosis.  Vessel is very small less than 1.5 mm.  RCA has 50% proximal and 85-90% mid stenosis and 1000% occluded.  Saphenous vein graft to PDA  has 20-30% distal stenosis.  Native PDA is small which is patent.  Saphenous vein graft to ramus/diagonal 1 has long 70-80% proximal and mid stenosis distally.  Vessel is small.  Sequential saphenous vein graft to OM-1 and OM-2 is patent.  LIMA to LAD is patent.  The patient tolerated the procedure well.  There were no complications.  Plan is to maximize antianginal medication and treat medically.  If he continues to have recurrent chest pain, may consider high-risk PCI to saphenous vein graft to ramus.     Eduardo Osier. Sharyn Lull, M.D.     MNH/MEDQ  D:  03/08/2011  T:  03/09/2011  Job:  [id number]  Electronically Signed by Rinaldo Cloud M.D. on 03/23/2011 11:14:47 PM

## 2011-03-24 ENCOUNTER — Ambulatory Visit (HOSPITAL_COMMUNITY)
Admission: RE | Admit: 2011-03-24 | Discharge: 2011-03-24 | Disposition: A | Discharge status: Home / Self Care | Payer: Medicare Other | Source: Ambulatory Visit | Attending: Ophthalmology | Admitting: Ophthalmology

## 2011-03-24 ENCOUNTER — Other Ambulatory Visit (HOSPITAL_COMMUNITY): Payer: Self-pay | Admitting: Ophthalmology

## 2011-03-24 ENCOUNTER — Encounter: Payer: Self-pay | Admitting: Internal Medicine

## 2011-03-24 ENCOUNTER — Encounter (HOSPITAL_COMMUNITY)
Admission: RE | Admit: 2011-03-24 | Discharge: 2011-03-24 | Disposition: A | Discharge status: Home / Self Care | Payer: Medicare Other | Source: Ambulatory Visit | Attending: Ophthalmology | Admitting: Ophthalmology

## 2011-03-24 DIAGNOSIS — Z95810 Presence of automatic (implantable) cardiac defibrillator: Secondary | ICD-10-CM | POA: Insufficient documentation

## 2011-03-24 DIAGNOSIS — H11009 Unspecified pterygium of unspecified eye: Secondary | ICD-10-CM | POA: Insufficient documentation

## 2011-03-24 DIAGNOSIS — H11002 Unspecified pterygium of left eye: Secondary | ICD-10-CM

## 2011-03-24 DIAGNOSIS — Z01812 Encounter for preprocedural laboratory examination: Secondary | ICD-10-CM | POA: Insufficient documentation

## 2011-03-24 DIAGNOSIS — I517 Cardiomegaly: Secondary | ICD-10-CM | POA: Insufficient documentation

## 2011-03-24 DIAGNOSIS — Z01818 Encounter for other preprocedural examination: Secondary | ICD-10-CM | POA: Insufficient documentation

## 2011-03-24 LAB — SURGICAL PCR SCREEN
MRSA, PCR: NEGATIVE
Staphylococcus aureus: NEGATIVE

## 2011-03-24 LAB — CBC
MCV: 90.3 fL (ref 78.0–100.0)
Platelets: 154 10*3/uL (ref 150–400)
RDW: 13.9 % (ref 11.5–15.5)
WBC: 4.3 10*3/uL (ref 4.0–10.5)

## 2011-03-24 LAB — BASIC METABOLIC PANEL
Calcium: 9.2 mg/dL (ref 8.4–10.5)
GFR calc non Af Amer: >60 mL/min (ref >60)
Sodium: 137 mEq/L (ref 135–145)

## 2011-03-30 ENCOUNTER — Ambulatory Visit (HOSPITAL_COMMUNITY)
Admission: RE | Admit: 2011-03-30 | Discharge: 2011-03-30 | Disposition: A | Discharge status: Home / Self Care | Payer: Medicare Other | Source: Ambulatory Visit | Attending: Ophthalmology | Admitting: Ophthalmology

## 2011-03-30 DIAGNOSIS — Z7902 Long term (current) use of antithrombotics/antiplatelets: Secondary | ICD-10-CM | POA: Insufficient documentation

## 2011-03-30 DIAGNOSIS — Z79899 Other long term (current) drug therapy: Secondary | ICD-10-CM | POA: Insufficient documentation

## 2011-03-30 DIAGNOSIS — Z7982 Long term (current) use of aspirin: Secondary | ICD-10-CM | POA: Insufficient documentation

## 2011-03-30 DIAGNOSIS — Z01812 Encounter for preprocedural laboratory examination: Secondary | ICD-10-CM | POA: Insufficient documentation

## 2011-03-30 DIAGNOSIS — Z95810 Presence of automatic (implantable) cardiac defibrillator: Secondary | ICD-10-CM | POA: Insufficient documentation

## 2011-03-30 DIAGNOSIS — H11009 Unspecified pterygium of unspecified eye: Secondary | ICD-10-CM | POA: Insufficient documentation

## 2011-03-30 DIAGNOSIS — I10 Essential (primary) hypertension: Secondary | ICD-10-CM | POA: Insufficient documentation

## 2011-04-06 HISTORY — PX: CORONARY ANGIOPLASTY WITH STENT PLACEMENT: SHX49

## 2011-04-26 ENCOUNTER — Ambulatory Visit: Payer: Self-pay | Admitting: Internal Medicine

## 2011-04-26 ENCOUNTER — Encounter: Payer: Self-pay | Admitting: Internal Medicine

## 2011-04-26 ENCOUNTER — Ambulatory Visit (INDEPENDENT_AMBULATORY_CARE_PROVIDER_SITE_OTHER): Payer: Medicare Other | Admitting: Internal Medicine

## 2011-04-26 DIAGNOSIS — I5022 Chronic systolic (congestive) heart failure: Secondary | ICD-10-CM

## 2011-04-26 DIAGNOSIS — I428 Other cardiomyopathies: Secondary | ICD-10-CM

## 2011-04-26 DIAGNOSIS — Z95810 Presence of automatic (implantable) cardiac defibrillator: Secondary | ICD-10-CM

## 2011-04-26 DIAGNOSIS — I2510 Atherosclerotic heart disease of native coronary artery without angina pectoris: Secondary | ICD-10-CM

## 2011-04-26 NOTE — Assessment & Plan Note (Signed)
His symptoms are class 2. He will continue his current meds. I have recommended a low sodium diet.

## 2011-04-26 NOTE — Assessment & Plan Note (Signed)
He is considering PCI. He will followup with Dr. Sharyn Lull.

## 2011-04-26 NOTE — Assessment & Plan Note (Signed)
His device is working normally but is near [employer]. I discussed whether he would want to have a new device as he has not had any malignant ventricular arrhythmias and is 84. Will discuss this further when I see him back in several months.

## 2011-04-26 NOTE — Patient Instructions (Signed)
Your physician recommends that you schedule a follow-up appointment in: 3months in device clinic and 6 months with Dr Taylor  

## 2011-04-26 NOTE — Progress Notes (Signed)
HPI [name] returns today for followup. He is a pleasant 77 yo man with a h/o HTN, CHF, dyslipidemia and CAD. He denies c/p, sob, or peripheral edema. No ICD shocks. No additional symptoms. His CHF is class 2. No Known Allergies   Current Outpatient Prescriptions  Medication Sig Dispense Refill  . aspirin 81 MG tablet Take 81 mg by mouth daily.        Marland Kitchen atorvastatin (LIPITOR) 20 MG tablet Take 20 mg by mouth daily.        . bisoprolol-hydrochlorothiazide (ZIAC) 2.5-6.25 MG per tablet Take 1 tablet by mouth daily.        . clopidogrel (PLAVIX) 75 MG tablet Take 75 mg by mouth daily.        . NON FORMULARY Take by mouth 2 (two) times daily. Gout medicine       . spironolactone (ALDACTONE) 25 MG tablet Take 25 mg by mouth daily.           Past Medical History  Diagnosis Date  . CAD (coronary artery disease)     s/p CABG; s/p Pacemaker    ROS:   All systems reviewed and negative except as noted in the HPI.   Past Surgical History  Procedure Date  . Coronary artery bypass graft      No family history on file.   History   Social History  . Marital Status: Divorced    Spouse Name: N/A    Number of Children: N/A  . Years of Education: N/A   Occupational History  . Not on file.   Social History Main Topics  . Smoking status: Current Some Day Smoker    Types: Pipe  . Smokeless tobacco: Not on file  . Alcohol Use: Yes     occasional  . Drug Use: No  . Sexually Active: Not on file   Other Topics Concern  . Not on file   Social History Narrative  . No narrative on file     BP 139/71  Pulse 56  Ht 5\' 4"  (1.626 m)  Wt 167 lb (75.751 kg)  BMI 28.67 kg/m2  Physical Exam:  Well appearing NAD HEENT: Unremarkable Neck:  No JVD, no thyromegally Lymphatics:  No adenopathy Back:  No CVA tenderness Lungs:  Clear. Well healed ICD incision. HEART:  Regular rate rhythm, no murmurs, no rubs, no clicks Abd:  soft, positive bowel sounds, no organomegally, no  rebound, no guarding Ext:  2 plus pulses, no edema, no cyanosis, no clubbing Skin:  No rashes no nodules Neuro:  CN II through XII intact, motor grossly intact  DEVICE  Normal device function.  See PaceArt for details.   Assess/Plan:

## 2011-05-03 ENCOUNTER — Inpatient Hospital Stay (HOSPITAL_COMMUNITY)
Admission: RE | Admit: 2011-05-03 | Discharge: 2011-05-04 | DRG: 246 | Disposition: A | Discharge status: Home / Self Care | Payer: Medicare Other | Source: Ambulatory Visit | Attending: Cardiology | Admitting: Cardiology

## 2011-05-03 DIAGNOSIS — I472 Ventricular tachycardia: Secondary | ICD-10-CM

## 2011-05-03 DIAGNOSIS — E7800 Pure hypercholesterolemia, unspecified: Secondary | ICD-10-CM | POA: Diagnosis present

## 2011-05-03 DIAGNOSIS — I252 Old myocardial infarction: Secondary | ICD-10-CM

## 2011-05-03 DIAGNOSIS — I4901 Ventricular fibrillation: Secondary | ICD-10-CM | POA: Diagnosis not present

## 2011-05-03 DIAGNOSIS — M109 Gout, unspecified: Secondary | ICD-10-CM | POA: Diagnosis present

## 2011-05-03 DIAGNOSIS — Z951 Presence of aortocoronary bypass graft: Secondary | ICD-10-CM

## 2011-05-03 DIAGNOSIS — I10 Essential (primary) hypertension: Secondary | ICD-10-CM | POA: Diagnosis present

## 2011-05-03 DIAGNOSIS — Z7982 Long term (current) use of aspirin: Secondary | ICD-10-CM

## 2011-05-03 DIAGNOSIS — Z7902 Long term (current) use of antithrombotics/antiplatelets: Secondary | ICD-10-CM

## 2011-05-03 DIAGNOSIS — I2510 Atherosclerotic heart disease of native coronary artery without angina pectoris: Secondary | ICD-10-CM | POA: Diagnosis present

## 2011-05-03 DIAGNOSIS — I200 Unstable angina: Principal | ICD-10-CM | POA: Diagnosis present

## 2011-05-03 DIAGNOSIS — Z95810 Presence of automatic (implantable) cardiac defibrillator: Secondary | ICD-10-CM

## 2011-05-03 DIAGNOSIS — I2589 Other forms of chronic ischemic heart disease: Secondary | ICD-10-CM | POA: Diagnosis present

## 2011-05-03 LAB — PLATELET INHIBITION P2Y12: Platelet Function  P2Y12: 136 [PRU] — ABNORMAL LOW (ref 194–418)

## 2011-05-03 LAB — CARDIAC PANEL(CRET KIN+CKTOT+MB+TROPI)
CK, MB: 5.6 ng/mL — ABNORMAL HIGH (ref 0.3–4.0)
Total CK: 139 U/L (ref 7–232)
Troponin I: <0.3 ng/mL (ref <0.30)

## 2011-05-04 LAB — CBC
MCH: 31.2 pg (ref 26.0–34.0)
Platelets: 126 10*3/uL — ABNORMAL LOW (ref 150–400)
RBC: 3.62 MIL/uL — ABNORMAL LOW (ref 4.22–5.81)
RDW: 13.8 % (ref 11.5–15.5)
WBC: 4.4 10*3/uL (ref 4.0–10.5)

## 2011-05-04 LAB — BASIC METABOLIC PANEL
CO2: 25 mEq/L (ref 19–32)
Calcium: 9.6 mg/dL (ref 8.4–10.5)
Creatinine, Ser: 1.26 mg/dL (ref 0.50–1.35)
GFR calc Af Amer: >60 mL/min (ref >60)
Sodium: 140 mEq/L (ref 135–145)

## 2011-05-14 ENCOUNTER — Inpatient Hospital Stay (INDEPENDENT_AMBULATORY_CARE_PROVIDER_SITE_OTHER)
Admission: RE | Admit: 2011-05-14 | Discharge: 2011-05-14 | Disposition: A | Discharge status: Home / Self Care | Payer: Medicare Other | Source: Ambulatory Visit | Attending: Emergency Medicine | Admitting: Emergency Medicine

## 2011-05-14 ENCOUNTER — Ambulatory Visit (INDEPENDENT_AMBULATORY_CARE_PROVIDER_SITE_OTHER): Payer: Medicare Other

## 2011-05-14 DIAGNOSIS — IMO0002 Reserved for concepts with insufficient information to code with codable children: Secondary | ICD-10-CM

## 2011-05-18 NOTE — Cardiovascular Report (Signed)
NAMEANTONI, [name] NO.:  [id number]  MEDICAL RECORD NO.:  [id number]  LOCATION:  [facility]                         FACILITY:  [facility]  PHYSICIAN:  Eduardo Osier. Sharyn Lull, M.D. DATE OF BIRTH:  1927-06-30  DATE OF PROCEDURE:  05/03/2011 DATE OF DISCHARGE:                           CARDIAC CATHETERIZATION   PROCEDURES: 1. Successful PTCA to ostial OM1 and proximal left circumflex using     2.0 x 15-mm long [place] TREK balloon. 2. Successful deployment of 2.5 x 20-mm long Promus Element drug-     eluting stent in ostial OM1 and proximal left circumflex. 3. Successful postdilatation of the stent using 2.5 x 15-mm long [place]     TREK balloon. 4. Successful deployment of 3.0 x 24-mm long Promus Element drug-     eluting stent in the ostial and proximal left circumflex. 5. Successful postdilatation of the stent using 3.0 x 15 and then 3.5     x 15-mm long [place] TREK balloon.  INDICATIONS FOR PROCEDURE:  Mr. Safley is an 77 year old black male with past medical history significant for coronary artery disease, history of inferior wall MI in the past, status post CABG x5 in the past.  He had LIMA to LAD, sequential saphenous vein graft to ramus, saphenous vein graft to diagonal 1, and saphenous vein graft to PDA in July 2004.  History of congestive heart failure secondary to systolic dysfunction, ischemic cardiomyopathy, status post single chamber ICD, hypertension, hypercholesteremia.  He complains of exertional dyspnea associated with minimal exertion associated with feeling weak and tired after playing five holes of golf.  The patient states he used to play 18 course of golf without any problem, but lately he gets tired, fatigued, and weak and short of breath.  The patient denies any chest pain, nausea, vomiting, diaphoresis.  The patient recently underwent left cath as outpatient and was noted to have critical stenosis in the native left circumflex obtuse marginal branch which was  not bypassed and 60-70% stenosis in the diagonal 1, which is small vessel distally.  The patient is admitted electively for possible PCI to left circumflex, obtuse marginal, and possibly to saphenous vein graft to diagonal 1.  Discussed with the patient at length regarding left cath finding and various options of treatment, i.e. medical versus invasive left cath, possible PTCA stenting, its risks and benefits i.e. death, MI, stroke, need for emergency CABG, risk of restenosis, local vascular complications etc. and consented for PCI.  PROCEDURE IN DETAIL:  After obtaining the informed consent, the patient was brought to the cath lab and was placed on fluoroscopy table.  Right groin was prepped and draped in usual fashion.  Xylocaine 1% was used for local anesthesia in the right groin.  With the help of thin-wall needle, 6-French arterial sheath was placed.  The sheath was aspirated and flushed.  Next, 6-French [place] guiding catheter was advanced over the wire under fluoroscopic guidance up to the ascending aorta.  Wire was pulled out.  The catheter was aspirated and connected to the manifold. Catheter was further advanced and engaged into left coronary ostium. Multiple views of the left system were taken.  FINDINGS:  The patient had 70-95% multiple  sequential stenosis in left circumflex, OM1 with filling defect at the ostium and in the proximal portion of the left circumflex with TIMI grade 3 distal flow.  INTERVENTIONAL PROCEDURE:  Successful PTCA to ostial OM1 and proximal left circumflex was done using 2.0 x 15-mm long [place] TREK balloon for predilatation and then 2.5 x 20-mm long Promus Element drug-eluting stent was deployed in ostial OM1 and proximal left circumflex at 10 atmospheric pressure.  Stent was postdilated using 2.5 x 15-mm long [place] TREK balloon going up to 13 atmospheric pressure and then 3.0 x 24-mm long Promus Element drug-eluting stent was deployed at 11  atmospheric pressure in proximal and distal left circumflex.  Stent was postdilated initially using 3.0 x 15-mm long [place] TREK balloon.  Angiogram showed under expansion of the stent in the midportion.  Then stent was expanded using 3.5 x 15-mm long [place] TREK balloon going up to 15 atmospheric pressure.  Lesion was dilated from 70-95% to less than 10% residual with excellent TIMI grade 3 distal flow without evidence of dissection or distal embolization.  The patient received weight-based Angiomax and 180 mg of Brilinta during the procedure.  The patient tolerated the procedure well.  There were no complications.  Then angiogram of saphenous vein graft to diagonal 1 was done using diagnostic left bypass catheter, which showed long diffuse proximal 60-70% stenosis in the graft distally.  Native diagonal vessel was small.  The patient had episode of V-fib after injection into this graft, which spontaneously converted with ICD discharge to sinus rhythm.  The patient tolerated the procedure well.  There were no complications.  The patient will be transferred to CCU and will be monitored closely for 24 hours.  The patient will get EP consult for ICD interrogation also.     Eduardo Osier. Sharyn Lull, M.D.     MNH/MEDQ  D:  05/03/2011  T:  05/03/2011  Job:  [id number]  cc:   Doylene Canning. Ladona Ridgel, MD  Electronically Signed by Rinaldo Cloud M.D. on 05/18/2011 09:42:55 PM

## 2011-05-18 NOTE — Discharge Summary (Signed)
NAMECRAVEN, CREAN NO.:  [id number]  MEDICAL RECORD NO.:  [id number]  LOCATION:  2905                         FACILITY:  [facility]  PHYSICIAN:  Eduardo Osier. Sharyn Lull, M.D. DATE OF BIRTH:  1927-06-16  DATE OF ADMISSION:  05/03/2011 DATE OF DISCHARGE:  05/04/2011                              DISCHARGE SUMMARY   ADMITTING DIAGNOSES: 1. Exertional dyspnea, probably angina/angina equivalent. 2. Status post recent left catheterization. 3. Coronary artery disease. 4. History of inferior wall myocardial infarction in the past. 5. Status post coronary artery bypass graft. 6. Multivessel coronary artery disease. 7. Ischemic cardiomyopathy. 8. History of ventricular tachycardia in the past. 9. Status post implantable cardioverter defibrillator. 10.Hypertension. 11.Hypercholesteremia. 12.History of gouty arthritis.  FINAL DIAGNOSES: 1. New-onset angina status post percutaneous transluminal coronary     angioplasty stenting to the left circumflex/obtuse marginal 1. 2. Multivessel coronary artery disease status post coronary artery     bypass graft. 3. History of inferior wall myocardial infarction in the past. 4. Ischemic cardiomyopathy. 5. History of nonsustained ventricular tachycardia status post single     chamber implantable cardioverter defibrillator in the past. 6. Hypertension. 7. Hypercholesteremia. 8. History of gouty arthritis. 9. Status post ventricular fibrillation arrest following injection     into the saphenous vein graft to diagonal 1. 10.Status post defibrillation x1 by implantable cardioverter     defibrillator.  DISCHARGE HOME MEDICATIONS: 1. Enteric-coated aspirin 81 mg 1 tablet daily. 2. Brillinta 90 mg 1 tablet twice daily. 3. Carvedilol 3.125 mg 1 tablet twice daily with meals. 4. Ramipril 1.25 mg 1 capsule daily. 5. Pepcid 20 mg p.o. b.i.d. 6. Lipitor 20 mg 1 tablet daily. 7. Aldactone 25 mg 1 tablet daily. 8. Pepcid 20 mg p.o. b.i.d. 9.  Nitrostat sublingual 0.4 mg use as directed.  DIET:  Low-salt, low-cholesterol 1800 calories ADA diet.  The patient has been advised to refrain from sweets.  FOLLOWUP:  Follow with me in 1 week.  We will up-titrate his beta- blockers and ACE inhibitors as outpatient as tolerated.  CONDITION AT DISCHARGE:  Stable.  BRIEF HISTORY AND HOSPITAL COURSE:  [name] is an 77 year old black male with past medical history significant for coronary artery disease, history of inferior wall MI in the past status post CABG x5 in July 2004.  He had LIMA to LAD, sequential saphenous vein graft to ramus, saphenous vein graft to diagonal 1, and saphenous vein graft to PDA, history of congestive heart failure secondary to systolic heart failure, hypertension, hypercholesteremia, ischemic cardiomyopathy status post single chamber ICD complains of exertional dyspnea with minimal exertion associated with feeling weak and tired after playing 5 holes of golf. The patient states he used to play 18-hole golf without any problem, but recently feels tired and fatigued after playing 5-hole gold.  Denies any chest pain, nausea, vomiting, or diaphoresis.  The patient recently had left cardiac cath as outpatient and was noted to have critical stenosis in native left circumflex/OM which was not bypassed and stenosis in saphenous vein graft to diagonal one which is small vessel.  The patient is admitted electively for PCI to left circumflex/OM1 and possibly to saphenous vein graft to diagonal 1.  PAST MEDICAL HISTORY:  As above.  PAST SURGICAL HISTORY:  Had ruptured bowel at the age of 63, had laparotomy, had coronary artery bypass grafting as above in July 2004 following inferior wall MI and status post cholecystectomy.  ALLERGIES:  No known drug allergies.  MEDICATION AT HOME:  He was on: 1. Bisoprolol/HCT 5/25 once a day. 2. Enteric-coated aspirin 81 mg p.o. daily. 3. Plavix 75 mg p.o. daily. 4. Pepcid 20  mg p.o. b.i.d. 5. Crestor 10 mg p.o. daily.  SOCIAL HISTORY:  He is divorced, two children.  No history of smoking. Drinks occasionally socially.  The patient has been advised to refrain from drinking.  Worked as a Scientist, physiological in the past.  FAMILY HISTORY:  Noncontributory.  PHYSICAL EXAMINATION:  GENERAL:  He was alert, awake, and oriented x3 in no acute distress. VITAL SIGNS:  Blood pressure was 120/76, pulse was 66 and regular. HEENT:  Conjunctivae were pink. NECK:  Supple, no JVD, and no bruit. LUNGS:  Clear to auscultation without rhonchi or rales. CARDIOVASCULAR:  S1-S2 was normal.  There was soft systolic murmur. There was no S3 gallop. ABDOMEN:  Soft, bowel sounds present, and nontender. EXTREMITIES:  There was no clubbing, cyanosis, or edema.  LABORATORY DATA:  Postprocedure, his CK is 139, MB 5.6, troponin-I was less than 0.30 which were negative.  His preprocedure platelet inhibition on Plavix PRU value was 313 which was elevated.  Plavix was switched to Brillinta, post-Brillinta dose PRU value was 136.  His hemoglobin is 11.3, hematocrit 32.6, white count of 4.4, and platelet count is 126,000 which has come down from 154,000 which will be monitored as outpatient.  His electrolyte sodium is 140, potassium 3.7, BUN 22, creatinine 1.26, and glucose is slightly elevated at 157.  BRIEF HOSPITAL COURSE:  The patient was a.m. admit and underwent PTCA stenting to left circumflex/OM-1 as per procedure report.  The patient tolerated procedure well.  There were no complications.  The patient had one episode of V-fib after injecting into the saphenous vein graft to diagonal 1.  This graft has 60-70% proximal stenosis, but the distal native diagonal vessel is very small and felt not suitable for any intervention.  The patient did undergo PTCA stenting to left circumflex and OM-1 without any complications and tolerated the procedure well. The patient did not have any episodes of  chest pain or V-fib during the hospital stay.  EP consultation was obtained with Dr. Johney Frame and the patient underwent interrogation of the ICD which did not suggest any mild function.  Phase I Cardiac Rehab was called.  The patient has been ambulating in hallway without any problems.  His groin is stable with no evidence of hematoma or bruit.  The patient will be discharged home on above medications.  We will up-titrate his beta-blockers and ACE inhibitors as outpatient and will be followed up in my office in 1 week as his blood pressure and renal function tolerates.  The patient will be followed up by Dr. Ladona Ridgel as outpatient as scheduled.     Eduardo Osier. Sharyn Lull, M.D.     MNH/MEDQ  D:  05/04/2011  T:  05/04/2011  Job:  [id number]  cc:   Doylene Canning. Ladona Ridgel, MD  Electronically Signed by Rinaldo Cloud M.D. on 05/18/2011 09:42:50 PM

## 2011-05-19 ENCOUNTER — Inpatient Hospital Stay (HOSPITAL_COMMUNITY)
Admission: EM | Admit: 2011-05-19 | Discharge: 2011-05-26 | DRG: 558 | Disposition: A | Discharge status: Skilled Nursing Facility | Payer: Medicare Other | Attending: Cardiology | Admitting: Cardiology

## 2011-05-19 DIAGNOSIS — D649 Anemia, unspecified: Secondary | ICD-10-CM | POA: Diagnosis present

## 2011-05-19 DIAGNOSIS — Z9861 Coronary angioplasty status: Secondary | ICD-10-CM

## 2011-05-19 DIAGNOSIS — E119 Type 2 diabetes mellitus without complications: Secondary | ICD-10-CM | POA: Diagnosis present

## 2011-05-19 DIAGNOSIS — I2589 Other forms of chronic ischemic heart disease: Secondary | ICD-10-CM | POA: Diagnosis present

## 2011-05-19 DIAGNOSIS — I2510 Atherosclerotic heart disease of native coronary artery without angina pectoris: Secondary | ICD-10-CM | POA: Diagnosis present

## 2011-05-19 DIAGNOSIS — M109 Gout, unspecified: Secondary | ICD-10-CM | POA: Diagnosis present

## 2011-05-19 DIAGNOSIS — E7800 Pure hypercholesterolemia, unspecified: Secondary | ICD-10-CM | POA: Diagnosis present

## 2011-05-19 DIAGNOSIS — I252 Old myocardial infarction: Secondary | ICD-10-CM

## 2011-05-19 DIAGNOSIS — Z79899 Other long term (current) drug therapy: Secondary | ICD-10-CM

## 2011-05-19 DIAGNOSIS — Z951 Presence of aortocoronary bypass graft: Secondary | ICD-10-CM

## 2011-05-19 DIAGNOSIS — Z7982 Long term (current) use of aspirin: Secondary | ICD-10-CM

## 2011-05-19 DIAGNOSIS — M7120 Synovial cyst of popliteal space [Baker], unspecified knee: Principal | ICD-10-CM | POA: Diagnosis present

## 2011-05-19 DIAGNOSIS — Z95810 Presence of automatic (implantable) cardiac defibrillator: Secondary | ICD-10-CM

## 2011-05-19 LAB — D-DIMER, QUANTITATIVE: D-Dimer, Quant: 1.91 ug/mL-FEU — ABNORMAL HIGH (ref 0.00–0.48)

## 2011-05-19 LAB — POCT I-STAT, CHEM 8
Creatinine, Ser: 1.2 mg/dL (ref 0.50–1.35)
HCT: 38 % — ABNORMAL LOW (ref 39.0–52.0)
Hemoglobin: 12.9 g/dL — ABNORMAL LOW (ref 13.0–17.0)
Potassium: 4.3 mEq/L (ref 3.5–5.1)
Sodium: 134 mEq/L — ABNORMAL LOW (ref 135–145)
TCO2: 22 mmol/L (ref 0–100)

## 2011-05-19 LAB — TRIGLYCERIDES: Triglycerides: 69 mg/dL (ref <150)

## 2011-05-19 LAB — DIFFERENTIAL
Basophils Absolute: 0 10*3/uL (ref 0.0–0.1)
Basophils Relative: 0 % (ref 0–1)
Eosinophils Absolute: 0.1 10*3/uL (ref 0.0–0.7)
Neutro Abs: 5.5 10*3/uL (ref 1.7–7.7)
Neutrophils Relative %: 80 % — ABNORMAL HIGH (ref 43–77)

## 2011-05-19 LAB — CBC
Hemoglobin: 12.6 g/dL — ABNORMAL LOW (ref 13.0–17.0)
MCH: 31.4 pg (ref 26.0–34.0)
Platelets: 150 10*3/uL (ref 150–400)
RBC: 4.01 MIL/uL — ABNORMAL LOW (ref 4.22–5.81)
WBC: 6.9 10*3/uL (ref 4.0–10.5)

## 2011-05-19 LAB — CK TOTAL AND CKMB (NOT AT ARMC)
Relative Index: INVALID (ref 0.0–2.5)
Total CK: 67 U/L (ref 7–232)

## 2011-05-20 LAB — CBC
HCT: 33.7 % — ABNORMAL LOW (ref 39.0–52.0)
MCV: 87.8 fL (ref 78.0–100.0)
Platelets: 148 10*3/uL — ABNORMAL LOW (ref 150–400)
RBC: 3.84 MIL/uL — ABNORMAL LOW (ref 4.22–5.81)
RDW: 13.2 % (ref 11.5–15.5)
WBC: 6.1 10*3/uL (ref 4.0–10.5)

## 2011-05-20 LAB — CK: Total CK: 66 U/L (ref 7–232)

## 2011-05-20 LAB — URIC ACID: Uric Acid, Serum: 8.8 mg/dL — ABNORMAL HIGH (ref 4.0–7.8)

## 2011-05-20 LAB — HEPARIN LEVEL (UNFRACTIONATED)
Heparin Unfractionated: 0.11 IU/mL — ABNORMAL LOW (ref 0.30–0.70)
Heparin Unfractionated: 0.15 IU/mL — ABNORMAL LOW (ref 0.30–0.70)

## 2011-05-20 LAB — LIPID PANEL
Cholesterol: 108 mg/dL (ref 0–200)
Total CHOL/HDL Ratio: 2.6 RATIO

## 2011-05-20 LAB — GLUCOSE, CAPILLARY: Glucose-Capillary: 134 mg/dL — ABNORMAL HIGH (ref 70–99)

## 2011-05-20 LAB — PROTIME-INR: INR: 1.13 (ref 0.00–1.49)

## 2011-05-21 DIAGNOSIS — M79609 Pain in unspecified limb: Secondary | ICD-10-CM

## 2011-05-21 LAB — BASIC METABOLIC PANEL
BUN: 22 mg/dL (ref 6–23)
CO2: 21 mEq/L (ref 19–32)
Calcium: 9.1 mg/dL (ref 8.4–10.5)
Creatinine, Ser: 0.97 mg/dL (ref 0.50–1.35)
Glucose, Bld: 218 mg/dL — ABNORMAL HIGH (ref 70–99)

## 2011-05-21 LAB — CBC
HCT: 31.2 % — ABNORMAL LOW (ref 39.0–52.0)
Hemoglobin: 11.2 g/dL — ABNORMAL LOW (ref 13.0–17.0)
MCH: 31.2 pg (ref 26.0–34.0)
MCHC: 35.9 g/dL (ref 30.0–36.0)
MCV: 86.9 fL (ref 78.0–100.0)

## 2011-05-21 LAB — GLUCOSE, CAPILLARY: Glucose-Capillary: 220 mg/dL — ABNORMAL HIGH (ref 70–99)

## 2011-05-21 LAB — HEPARIN LEVEL (UNFRACTIONATED): Heparin Unfractionated: 0.18 IU/mL — ABNORMAL LOW (ref 0.30–0.70)

## 2011-05-22 LAB — CBC
HCT: 29.3 % — ABNORMAL LOW (ref 39.0–52.0)
MCV: 86.2 fL (ref 78.0–100.0)
RBC: 3.4 MIL/uL — ABNORMAL LOW (ref 4.22–5.81)
WBC: 6.4 10*3/uL (ref 4.0–10.5)

## 2011-05-22 LAB — GLUCOSE, CAPILLARY
Glucose-Capillary: 146 mg/dL — ABNORMAL HIGH (ref 70–99)
Glucose-Capillary: 146 mg/dL — ABNORMAL HIGH (ref 70–99)
Glucose-Capillary: 179 mg/dL — ABNORMAL HIGH (ref 70–99)

## 2011-05-23 LAB — GLUCOSE, CAPILLARY
Glucose-Capillary: 214 mg/dL — ABNORMAL HIGH (ref 70–99)
Glucose-Capillary: 153 mg/dL — ABNORMAL HIGH (ref 70–99)
Glucose-Capillary: 133 mg/dL — ABNORMAL HIGH (ref 70–99)

## 2011-05-23 LAB — CBC
MCHC: 36.2 g/dL — ABNORMAL HIGH (ref 30.0–36.0)
MCV: 86.5 fL (ref 78.0–100.0)
Platelets: 178 10*3/uL (ref 150–400)
RDW: 12.7 % (ref 11.5–15.5)
WBC: 7.6 10*3/uL (ref 4.0–10.5)

## 2011-05-24 LAB — BASIC METABOLIC PANEL
BUN: 36 mg/dL — ABNORMAL HIGH (ref 6–23)
CO2: 18 mEq/L — ABNORMAL LOW (ref 19–32)
GFR calc Af Amer: >60 mL/min (ref >60)
Glucose, Bld: 111 mg/dL — ABNORMAL HIGH (ref 70–99)
Potassium: 4 mEq/L (ref 3.5–5.1)
Sodium: 131 mEq/L — ABNORMAL LOW (ref 135–145)

## 2011-05-24 LAB — GLUCOSE, CAPILLARY: Glucose-Capillary: 185 mg/dL — ABNORMAL HIGH (ref 70–99)

## 2011-05-24 LAB — CBC
HCT: 28.4 % — ABNORMAL LOW (ref 39.0–52.0)
Platelets: 162 10*3/uL (ref 150–400)
RDW: 12.7 % (ref 11.5–15.5)
WBC: 4.4 10*3/uL (ref 4.0–10.5)

## 2011-05-25 LAB — GLUCOSE, CAPILLARY
Glucose-Capillary: 118 mg/dL — ABNORMAL HIGH (ref 70–99)
Glucose-Capillary: 185 mg/dL — ABNORMAL HIGH (ref 70–99)
Glucose-Capillary: 198 mg/dL — ABNORMAL HIGH (ref 70–99)
Glucose-Capillary: 131 mg/dL — ABNORMAL HIGH (ref 70–99)

## 2011-05-25 LAB — CBC
HCT: 28.2 % — ABNORMAL LOW (ref 39.0–52.0)
Hemoglobin: 10.4 g/dL — ABNORMAL LOW (ref 13.0–17.0)
MCHC: 36.9 g/dL — ABNORMAL HIGH (ref 30.0–36.0)
RBC: 3.32 MIL/uL — ABNORMAL LOW (ref 4.22–5.81)

## 2011-05-26 LAB — GLUCOSE, CAPILLARY: Glucose-Capillary: 96 mg/dL (ref 70–99)

## 2011-06-06 ENCOUNTER — Encounter: Payer: Medicare Other | Admitting: *Deleted

## 2011-06-14 LAB — URIC ACID: Uric Acid, Serum: 9.1 — ABNORMAL HIGH

## 2011-06-15 NOTE — Discharge Summary (Signed)
[name], POET NO.:  [id number]  MEDICAL RECORD NO.:  [id number]  LOCATION:  3729                         FACILITY:  [facility]  PHYSICIAN:  Eduardo Osier. Sharyn Lull, M.D. DATE OF BIRTH:  [birth year]  DATE OF ADMISSION:  05/19/2011 DATE OF DISCHARGE:  05/25/2011                              DISCHARGE SUMMARY   ADMITTING DIAGNOSES: 1. Right leg swelling, rule out DVT, rule out gouty arthritis. 2. Pulmonary artery disease, status post recent PCI to the left     circumflex, status post coronary artery bypass graft in the past. 3. History of inferior wall myocardial infarction in the past. 4. Hypertension. 5. Ischemic cardiomyopathy. 6. History of ventricular tachycardia in the past, status post ICD. 7. Hypercholesteremia. 8. History of gouty arthritis. 9. Elevated blood sugar. 10.Rule out diabetes mellitus.  FINAL DIAGNOSES: 1. Status post leg swelling. 2. Status post ruptured Baker cyst. 3. Resolving acute gouty arthritis. 4. Coronary artery disease, status post coronary artery bypass graft     in the past. 5. Status post recent PCI to left circumflex. 6. History of inferior wall myocardial infarction in the past. 7. Ischemic cardiomyopathy. 8. History of ventricular tachycardia in the past, status post ICD. 9. Hypertension. 10.Hypercholesteremia. 11.New-onset diabetes mellitus. 12.Anemia which is stable.  DISCHARGE HOME MEDICATIONS: 1. Allopurinol 300 mg one tablet daily. 2. Celebrex 200 mg one capsule daily with food. 3. Amaryl 1 mg one tablet daily. 4. Percocet one tablet every 6 hours as needed for pain. 5. Ramipril 2.5 mg one capsule twice daily. 6. Carvedilol 3.125 mg one tablet twice daily. 7. Enteric-coated aspirin 81 mg one tablet daily. 8. Dilantin 90 mg one tablet twice daily. 9. Lipitor 20 mg one tablet daily. 10.Nitrostat 0.4 mg sublingual use as directed. 11.Carafate 1 g every 6 hours.  DIET:  Low salt, low cholesterol, 1800 calories ADA  diet.  ACTIVITIES:  Increase activity slowly with assistance.  INSTRUCTIONS:  The patient has been advised to monitor blood sugar and blood pressure daily.  The patient will be discharged to skilled nursing facility/rehab at [facility] for short term.  The patient has been advised to also record weight daily and CHF instructions have been given.  FOLLOWUP:  Follow up with me in one week.  CONDITION AT DISCHARGE:  Stable.  BRIEF HISTORY AND HOSPITAL COURSE:  Mr. Kochan is an 78 year old black male with past medical history significant for coronary artery disease, history of inferior wall myocardial infarction in the past, status post CABG x5 in July 2004, status post PTCA stenting to left circumflex in August 2012, history of congestive heart failure secondary to systolic dysfunction, ischemic cardiomyopathy status post ICD, hypertension, hypercholesteremia.  He came to the ER complaining of right leg pain and swelling for last 1 week.  States pain started in right knee and radiated to the leg associated with swelling.  Denies history of trauma or fall.  States feels like acute gout attack.  States has been in bed for more than 18 hours or 24 hours almost daily for last 1 week.  Denies any chest pain, shortness of breath.  Denies nausea, vomiting, diaphoresis.  Denies PND, orthopnea.  Denies palpitation, lightheadedness, or  syncope.  PAST MEDICAL HISTORY:  As above.  PAST SURGICAL HISTORY:  He had history of CABG in 2004 as above, has history of ruptured bowel at the age of 84, had cholecystectomy in the past.  ALLERGIES:  No known drug allergies.  MEDICATION AT HOME:  He is on aspirin, Dilantin, Coreg, ramipril, Pepcid, Lipitor, Aldactone, and Nitrostat.  SOCIAL HISTORY:  He is divorced, 2 children.  No history of smoking. Occasionally drinks socially.  Worked as Scientist, physiological in the past.  FAMILY HISTORY:  Noncontributory.  PHYSICAL EXAMINATION:  GENERAL:  He is alert,  awake, and oriented x3, in no acute distress. VITAL SIGNS:  Blood pressure was 136/80, pulse was 93 and regular. HEENT:  Conjunctivae were pink. NECK:  Supple.  No JVD.  No bruit. LUNGS:  Clear to auscultation without rhonchi or rales. CARDIOVASCULAR:  S1, S2 was normal.  There was soft systolic murmur. There was no S3 or gallop. ABDOMEN:  Soft.  Bowel sounds were present, nontender. EXTREMITIES:  There is no clubbing, cyanosis, or edema in the left leg. In the right leg, he had 2+ edema with minimal right knee fluid associated with warmth.  LABORATORY DATA:  Hemoglobin was 12.6, hematocrit 35.1, white count of 6.9.  Potassium was 4.3, glucose was 204, BUN 26, creatinine 1.20.  His D-dimers were elevated at 1.91 and duplex ultrasound of the lower extremities showed no obvious evidence of DVT or superficial thrombus, but was positive for Baker cyst in the right that appeared to be ruptured.  ASSESSMENT AND PLAN:  The patient was initially started on IV heparin which was discontinued after deep vein thrombosis was ruled out.  The patient was started on Cox-2 inhibitor with resolution of his knee swelling and pain.  His uric acid level was also elevated.  The patient was started on allopurinol.  The patient's blood sugar has been staying in the range of 100-200 with hemoglobin A1c was 7.5.  The patient was started on Amaryl 1 mg daily.  His sugar stays between 100-175 range. OT/PT consultation was obtained.  The patient has been ambulating in the room with lot of assistance.  Discussed with the patient regarding short- term skilled nursing facility/rehab to which he agrees.  The patient has bed offer at [facility] and will be transferred to skilled nursing facility today.     Eduardo Osier. Sharyn Lull, M.D.     MNH/MEDQ  D:  05/26/2011  T:  05/26/2011  Job:  [id number]  Electronically Signed by Rinaldo Cloud M.D. on 06/15/2011 07:47:10 PM

## 2011-06-17 LAB — CBC
HCT: 35.2 — ABNORMAL LOW
Hemoglobin: 12.1 — ABNORMAL LOW
MCV: 91.9
Platelets: 157
RDW: 14.4 — ABNORMAL HIGH

## 2011-06-17 LAB — DIFFERENTIAL
Basophils Absolute: 0
Eosinophils Absolute: 0.1
Eosinophils Relative: 3
Lymphocytes Relative: 21
Lymphs Abs: 0.8
Monocytes Absolute: 0.3

## 2011-06-17 LAB — POCT CARDIAC MARKERS
CKMB, poc: <1 — ABNORMAL LOW
Operator id: 192351
Operator id: 192351
Troponin i, poc: <0.05
Troponin i, poc: <0.05

## 2011-06-17 LAB — BASIC METABOLIC PANEL
BUN: 27 — ABNORMAL HIGH
Chloride: 106
Glucose, Bld: 162 — ABNORMAL HIGH
Potassium: 4.5
Sodium: 139

## 2011-06-17 LAB — URINALYSIS, ROUTINE W REFLEX MICROSCOPIC
Bilirubin Urine: NEGATIVE
Glucose, UA: NEGATIVE
Hgb urine dipstick: NEGATIVE
Ketones, ur: NEGATIVE
Specific Gravity, Urine: 1.016
pH: 5.5

## 2011-06-23 NOTE — Op Note (Signed)
  NAMEREI, MEDLEN NO.:  [id number]  MEDICAL RECORD NO.:  [id number]  LOCATION:  SDSC                         FACILITY:  [facility]  PHYSICIAN:  Chalmers Guest, M.D.     DATE OF BIRTH:  1927-06-21  DATE OF PROCEDURE:  03/30/2011 DATE OF DISCHARGE:                              OPERATIVE REPORT   PREOPERATIVE DIAGNOSIS:  Pterygium obstructing the patient's vision causing irregular astigmatism left eye.  POSTOPERATIVE DIAGNOSIS:  Pterygium obstructing the patient's vision causing irregular astigmatism left eye.  PROCEDURE:  Pterygium excision with mitomycin C with amniotic membrane graft to prevent regrowth.  COMPLICATIONS:  None.  ANESTHESIA:  Topical 2% Xylocaine with Sensorcaine and tetracaine.  PROCEDURE:  The patient was taken to the operating room where the aforementioned local anesthetic agents were applied to the eye. Following this using a Maumenee-Colibri  forceps and a Grieshaber 5700 blade dissection was started on the cornea and continued to the corneoscleral limbus elevating the pterygium.  Dissection was carried posteriorly to 5 mm posterior to the corneoscleral limbus.  Bleeding was controlled with cautery.  Following this, a dental bur was then used on the cornea to clear way remaining fibers and a 15 degree Bard-Parker blade was used to scrape the cornea.  The cornea appeared to be much clear than before the procedure.  Following this, mitomycin C 0.4 mg/mL was placed on Gelfoam sponge and wrapped in the pterygium head and allowed to stay there for 2 minutes and then irrigated with 40 mL of balanced salt solution.  A sharp Westcott scissors were then used to excise the full head of base of the pterygium.  Bleeding again was controlled with cautery.  At this point, an amniotic membrane graft was then used.  The amniotic membrane was fashioned and then anchored with an 8-0 Vicryl suture at the corneoscleral limbus and then the posterior edge  was slid under the conjunctiva and anchored with additional 8-0 Vicryl sutures to keep it in place.  Topical TobraDex ointment was applied to the eye.  A patch and [name] shield were placed and the patient returned to recovery area in stable condition.     Chalmers Guest, M.D.     RW/MEDQ  D:  03/30/2011  T:  03/31/2011  Job:  [id number]  Electronically Signed by Chalmers Guest M.D. on 06/23/2011 05:57:39 PM

## 2011-07-06 ENCOUNTER — Inpatient Hospital Stay (INDEPENDENT_AMBULATORY_CARE_PROVIDER_SITE_OTHER)
Admission: RE | Admit: 2011-07-06 | Discharge: 2011-07-06 | Disposition: A | Discharge status: Home / Self Care | Payer: Medicare Other | Source: Ambulatory Visit | Attending: Emergency Medicine | Admitting: Emergency Medicine

## 2011-07-06 DIAGNOSIS — M109 Gout, unspecified: Secondary | ICD-10-CM

## 2011-07-25 ENCOUNTER — Encounter: Payer: Medicare Other | Admitting: *Deleted

## 2011-07-25 ENCOUNTER — Other Ambulatory Visit: Payer: Self-pay | Admitting: Internal Medicine

## 2011-07-25 ENCOUNTER — Ambulatory Visit (INDEPENDENT_AMBULATORY_CARE_PROVIDER_SITE_OTHER): Payer: Medicare Other | Admitting: *Deleted

## 2011-07-25 DIAGNOSIS — I428 Other cardiomyopathies: Secondary | ICD-10-CM

## 2011-07-25 DIAGNOSIS — Z95810 Presence of automatic (implantable) cardiac defibrillator: Secondary | ICD-10-CM

## 2011-07-25 NOTE — Progress Notes (Signed)
icd check in clinic  

## 2011-07-26 ENCOUNTER — Other Ambulatory Visit: Payer: Self-pay | Admitting: Cardiology

## 2011-08-25 ENCOUNTER — Other Ambulatory Visit: Payer: Self-pay | Admitting: Cardiology

## 2011-08-25 ENCOUNTER — Ambulatory Visit (INDEPENDENT_AMBULATORY_CARE_PROVIDER_SITE_OTHER): Payer: Medicare Other | Admitting: *Deleted

## 2011-08-25 ENCOUNTER — Encounter: Payer: Self-pay | Admitting: Internal Medicine

## 2011-08-25 DIAGNOSIS — R0989 Other specified symptoms and signs involving the circulatory and respiratory systems: Secondary | ICD-10-CM

## 2011-08-25 DIAGNOSIS — I428 Other cardiomyopathies: Secondary | ICD-10-CM

## 2011-08-25 LAB — ICD DEVICE OBSERVATION
FVT: 0
PACEART VT: 0
RV LEAD IMPEDENCE ICD: 308 Ohm
TZAT-0001FASTVT: 2
TZAT-0001FASTVT: 3
TZAT-0002FASTVT: NEGATIVE
TZAT-0002FASTVT: NEGATIVE
TZAT-0002FASTVT: NEGATIVE
TZAT-0004SLOWVT: 6
TZAT-0004SLOWVT: 8
TZAT-0005SLOWVT: 91 pct
TZAT-0011SLOWVT: 10 ms
TZAT-0011SLOWVT: 10 ms
TZAT-0012FASTVT: 200 ms
TZAT-0012FASTVT: 200 ms
TZAT-0012FASTVT: 200 ms
TZAT-0012SLOWVT: 200 ms
TZAT-0012SLOWVT: 200 ms
TZAT-0018FASTVT: NEGATIVE
TZAT-0018FASTVT: NEGATIVE
TZAT-0018FASTVT: NEGATIVE
TZAT-0019FASTVT: 8 V
TZAT-0019FASTVT: 8 V
TZAT-0019FASTVT: 8 V
TZAT-0019FASTVT: 8 V
TZAT-0020FASTVT: 1.6 ms
TZAT-0020FASTVT: 1.6 ms
TZAT-0020FASTVT: 1.6 ms
TZAT-0020FASTVT: 1.6 ms
TZON-0003FASTVT: 0 ms
TZON-0003SLOWVT: 350 ms
TZON-0008FASTVT: 0 ms
TZON-0010AFLUTTER: 30 ms
TZON-0010VSLOWVT: 30 ms
TZON-0011AFLUTTER: 70
TZST-0001SLOWVT: 4
TZST-0001SLOWVT: 5
TZST-0003SLOWVT: 10 J
TZST-0003SLOWVT: 35 J
VF: 0

## 2011-09-16 ENCOUNTER — Other Ambulatory Visit: Payer: Self-pay | Admitting: Cardiology

## 2011-09-28 ENCOUNTER — Encounter: Payer: Self-pay | Admitting: *Deleted

## 2011-09-28 ENCOUNTER — Ambulatory Visit (INDEPENDENT_AMBULATORY_CARE_PROVIDER_SITE_OTHER): Payer: Medicare Other | Admitting: *Deleted

## 2011-09-28 ENCOUNTER — Encounter: Payer: Self-pay | Admitting: Internal Medicine

## 2011-09-28 ENCOUNTER — Ambulatory Visit (INDEPENDENT_AMBULATORY_CARE_PROVIDER_SITE_OTHER): Payer: Medicare Other | Admitting: Internal Medicine

## 2011-09-28 ENCOUNTER — Other Ambulatory Visit: Payer: Self-pay | Admitting: *Deleted

## 2011-09-28 VITALS — BP 142/79 | HR 51 | Ht 63.0 in | Wt 160.0 lb

## 2011-09-28 DIAGNOSIS — I2510 Atherosclerotic heart disease of native coronary artery without angina pectoris: Secondary | ICD-10-CM

## 2011-09-28 DIAGNOSIS — I428 Other cardiomyopathies: Secondary | ICD-10-CM

## 2011-09-28 DIAGNOSIS — I5022 Chronic systolic (congestive) heart failure: Secondary | ICD-10-CM

## 2011-09-28 DIAGNOSIS — Z95810 Presence of automatic (implantable) cardiac defibrillator: Secondary | ICD-10-CM

## 2011-09-28 LAB — ICD DEVICE OBSERVATION
BATTERY VOLTAGE: 2.62 V
CHARGE TIME: 11.73 s
RV LEAD IMPEDENCE ICD: 304 Ohm
TZAT-0001FASTVT: 3
TZAT-0001FASTVT: 5
TZAT-0002FASTVT: NEGATIVE
TZAT-0012FASTVT: 200 ms
TZAT-0012FASTVT: 200 ms
TZAT-0012SLOWVT: 200 ms
TZAT-0012SLOWVT: 200 ms
TZAT-0013SLOWVT: 2
TZAT-0013SLOWVT: 2
TZAT-0018FASTVT: NEGATIVE
TZAT-0018FASTVT: NEGATIVE
TZAT-0018FASTVT: NEGATIVE
TZAT-0018FASTVT: NEGATIVE
TZAT-0018SLOWVT: NEGATIVE
TZAT-0019FASTVT: 8 V
TZAT-0019FASTVT: 8 V
TZAT-0019FASTVT: 8 V
TZAT-0019FASTVT: 8 V
TZAT-0019SLOWVT: 8 V
TZAT-0019SLOWVT: 8 V
TZAT-0020FASTVT: 1.6 ms
TZAT-0020FASTVT: 1.6 ms
TZAT-0020FASTVT: 1.6 ms
TZAT-0020SLOWVT: 1.6 ms
TZAT-0020SLOWVT: 1.6 ms
TZON-0003SLOWVT: 350 ms
TZON-0004SLOWVT: 16
TZON-0008SLOWVT: 0 ms
TZON-0010SLOWVT: 30 ms
TZON-0010VSLOWVT: 30 ms
TZON-0011AFLUTTER: 70
TZST-0001SLOWVT: 5
TZST-0003SLOWVT: 20 J
TZST-0003SLOWVT: 35 J
VENTRICULAR PACING ICD: 0 pct
VF: 0

## 2011-09-28 NOTE — Patient Instructions (Signed)
See attached instruction sheet for ICD generator change.

## 2011-09-28 NOTE — Progress Notes (Signed)
HPI [name] returns today for followup. He is a pleasant elderly man with an ICM, chronic class 2 systolic heart failure, and HTN. The patient denies c/p but does have ongoing class 2 CHF. He denies syncope or any recent ICD shocks. His ICD has reached [facility].  No Known Allergies   Current Outpatient Prescriptions  Medication Sig Dispense Refill  . allopurinol (ZYLOPRIM) 300 MG tablet Take 300 mg by mouth daily.        . aspirin 81 MG tablet Take 81 mg by mouth daily.        . atorvastatin (LIPITOR) 20 MG tablet Take 20 mg by mouth daily.        . bisoprolol-hydrochlorothiazide (ZIAC) 2.5-6.25 MG per tablet Take 1 tablet by mouth daily.        . carvedilol (COREG) 6.25 MG tablet Take 6.25 mg by mouth 2 (two) times daily with a meal.        . clopidogrel (PLAVIX) 75 MG tablet Take 75 mg by mouth daily.        . famotidine (PEPCID) 20 MG tablet Take 20 mg by mouth 2 (two) times daily.        . glimepiride (AMARYL) 1 MG tablet Take 1 mg by mouth daily before breakfast.        . NON FORMULARY Take by mouth 2 (two) times daily. Gout medicine       . ramipril (ALTACE) 2.5 MG capsule Take 2.5 mg by mouth 2 (two) times daily.        . spironolactone (ALDACTONE) 25 MG tablet Take 25 mg by mouth daily.        . sucralfate (CARAFATE) 1 G tablet Take 1 g by mouth every 6 (six) hours.        . Ticagrelor (BRILINTA) 90 MG TABS tablet Take 90 mg by mouth 2 (two) times daily.           Past Medical History  Diagnosis Date  . CAD (coronary artery disease)     s/p CABG; s/p Pacemaker    ROS:   All systems reviewed and negative except as noted in the HPI.   Past Surgical History  Procedure Date  . Coronary artery bypass graft      No family history on file.   History   Social History  . Marital Status: Divorced    Spouse Name: N/A    Number of Children: N/A  . Years of Education: N/A   Occupational History  . Not on file.   Social History Main Topics  . Smoking status: Current  Some Day Smoker    Types: Pipe, Cigars  . Smokeless tobacco: Not on file   Comment: every "now and then"  . Alcohol Use: No     occasional  . Drug Use: No  . Sexually Active: Not on file   Other Topics Concern  . Not on file   Social History Narrative  . No narrative on file     BP 142/79  Pulse 51  Ht 5' 3" (1.6 m)  Wt 72.576 kg (160 lb)  BMI 28.34 kg/m2  Physical Exam:  Well appearing NAD HEENT: Unremarkable Neck:  No JVD, no thyromegally Lungs:  Clear with no wheezes, rales, or rhonchi. HEART:  Regular rate rhythm, no murmurs, no rubs, no clicks Abd:  soft, positive bowel sounds, no organomegally, no rebound, no guarding Ext:  2 plus pulses, no edema, no cyanosis, no clubbing Skin:  No rashes no   nodules Neuro:  CN II through XII intact, motor grossly intact  DEVICE  Normal device function.  See PaceArt for details. He is at [facility].  Assess/Plan:   

## 2011-09-28 NOTE — Assessment & Plan Note (Signed)
He denies anginal symptoms. He will continue his current meds.  

## 2011-09-28 NOTE — Assessment & Plan Note (Signed)
His symptoms remain class 2. He will continue his current meds and maintain a low sodium diet. 

## 2011-09-28 NOTE — Assessment & Plan Note (Signed)
His device is at [facility]. He will proceed with ICD generator change.

## 2011-09-30 ENCOUNTER — Encounter (HOSPITAL_COMMUNITY): Payer: Self-pay | Admitting: Respiratory Therapy

## 2011-10-05 ENCOUNTER — Other Ambulatory Visit (INDEPENDENT_AMBULATORY_CARE_PROVIDER_SITE_OTHER): Payer: Medicare Other | Admitting: *Deleted

## 2011-10-05 DIAGNOSIS — I428 Other cardiomyopathies: Secondary | ICD-10-CM

## 2011-10-05 LAB — CBC WITH DIFFERENTIAL/PLATELET
Eosinophils Relative: 6.1 % — ABNORMAL HIGH (ref 0.0–5.0)
HCT: 31.4 % — ABNORMAL LOW (ref 39.0–52.0)
Lymphocytes Relative: 18.8 % (ref 12.0–46.0)
Lymphs Abs: 0.7 10*3/uL (ref 0.7–4.0)
Monocytes Relative: 11.8 % (ref 3.0–12.0)
Platelets: 130 10*3/uL — ABNORMAL LOW (ref 150.0–400.0)
WBC: 3.9 10*3/uL — ABNORMAL LOW (ref 4.5–10.5)

## 2011-10-05 LAB — BASIC METABOLIC PANEL
CO2: 19 mEq/L (ref 19–32)
Calcium: 9.3 mg/dL (ref 8.4–10.5)
Chloride: 114 mEq/L — ABNORMAL HIGH (ref 96–112)
Glucose, Bld: 92 mg/dL (ref 70–99)
Potassium: 4.2 mEq/L (ref 3.5–5.1)
Sodium: 143 mEq/L (ref 135–145)

## 2011-10-11 MED ORDER — SODIUM CHLORIDE 0.9 % IR SOLN
80.0000 mg | Status: DC
  Filled 2011-10-11: qty 2

## 2011-10-11 MED ORDER — CEFAZOLIN SODIUM 1-5 GM-% IV SOLN
1.0000 g | INTRAVENOUS | Status: AC
  Administered 2011-10-12: 1 g via INTRAVENOUS

## 2011-10-12 ENCOUNTER — Other Ambulatory Visit: Payer: Self-pay

## 2011-10-12 ENCOUNTER — Encounter (HOSPITAL_COMMUNITY)
Admission: RE | Disposition: A | Discharge status: Home / Self Care | Payer: Self-pay | Source: Ambulatory Visit | Attending: Internal Medicine

## 2011-10-12 ENCOUNTER — Ambulatory Visit (HOSPITAL_COMMUNITY)
Admission: RE | Admit: 2011-10-12 | Discharge: 2011-10-13 | Disposition: A | Discharge status: Home / Self Care | Payer: Medicare Other | Source: Ambulatory Visit | Attending: Internal Medicine | Admitting: Internal Medicine

## 2011-10-12 DIAGNOSIS — I2589 Other forms of chronic ischemic heart disease: Secondary | ICD-10-CM | POA: Insufficient documentation

## 2011-10-12 DIAGNOSIS — I509 Heart failure, unspecified: Secondary | ICD-10-CM | POA: Insufficient documentation

## 2011-10-12 DIAGNOSIS — I5022 Chronic systolic (congestive) heart failure: Secondary | ICD-10-CM | POA: Insufficient documentation

## 2011-10-12 DIAGNOSIS — E785 Hyperlipidemia, unspecified: Secondary | ICD-10-CM | POA: Insufficient documentation

## 2011-10-12 DIAGNOSIS — I10 Essential (primary) hypertension: Secondary | ICD-10-CM | POA: Insufficient documentation

## 2011-10-12 DIAGNOSIS — I428 Other cardiomyopathies: Secondary | ICD-10-CM

## 2011-10-12 DIAGNOSIS — I4729 Other ventricular tachycardia: Secondary | ICD-10-CM | POA: Insufficient documentation

## 2011-10-12 DIAGNOSIS — I472 Ventricular tachycardia, unspecified: Secondary | ICD-10-CM | POA: Insufficient documentation

## 2011-10-12 DIAGNOSIS — I2510 Atherosclerotic heart disease of native coronary artery without angina pectoris: Secondary | ICD-10-CM | POA: Insufficient documentation

## 2011-10-12 DIAGNOSIS — Z95810 Presence of automatic (implantable) cardiac defibrillator: Secondary | ICD-10-CM

## 2011-10-12 DIAGNOSIS — Z951 Presence of aortocoronary bypass graft: Secondary | ICD-10-CM | POA: Insufficient documentation

## 2011-10-12 DIAGNOSIS — Z4502 Encounter for adjustment and management of automatic implantable cardiac defibrillator: Secondary | ICD-10-CM | POA: Insufficient documentation

## 2011-10-12 HISTORY — PX: IMPLANTABLE CARDIOVERTER DEFIBRILLATOR (ICD) GENERATOR CHANGE: SHX5469

## 2011-10-12 HISTORY — DX: Ischemic cardiomyopathy: I25.5

## 2011-10-12 HISTORY — DX: Unspecified systolic (congestive) heart failure: I50.20

## 2011-10-12 HISTORY — PX: PACEMAKER REVISION: SHX5482

## 2011-10-12 HISTORY — DX: Essential (primary) hypertension: I10

## 2011-10-12 HISTORY — DX: Hyperlipidemia, unspecified: E78.5

## 2011-10-12 SURGERY — ICD GENERATOR CHANGE
Anesthesia: LOCAL

## 2011-10-12 MED ORDER — CARVEDILOL 6.25 MG PO TABS
6.2500 mg | ORAL_TABLET | Freq: Two times a day (BID) | ORAL | Status: DC
  Administered 2011-10-12 – 2011-10-13 (×2): 6.25 mg via ORAL
  Filled 2011-10-12 (×4): qty 1

## 2011-10-12 MED ORDER — CEFAZOLIN SODIUM 1-5 GM-% IV SOLN
1.0000 g | Freq: Four times a day (QID) | INTRAVENOUS | Status: AC
  Administered 2011-10-12 – 2011-10-13 (×3): 1 g via INTRAVENOUS
  Filled 2011-10-12 (×3): qty 50

## 2011-10-12 MED ORDER — MIDAZOLAM HCL 2 MG/2ML IJ SOLN
INTRAMUSCULAR | Status: AC
  Filled 2011-10-12: qty 2

## 2011-10-12 MED ORDER — MUPIROCIN 2 % EX OINT
TOPICAL_OINTMENT | Freq: Once | CUTANEOUS | Status: DC
  Filled 2011-10-12: qty 22

## 2011-10-12 MED ORDER — FENTANYL CITRATE 0.05 MG/ML IJ SOLN
INTRAMUSCULAR | Status: AC
  Filled 2011-10-12: qty 2

## 2011-10-12 MED ORDER — BISOPROLOL-HYDROCHLOROTHIAZIDE 2.5-6.25 MG PO TABS
1.0000 | ORAL_TABLET | Freq: Every day | ORAL | Status: DC
  Administered 2011-10-13: 1 via ORAL
  Filled 2011-10-12: qty 1

## 2011-10-12 MED ORDER — SPIRONOLACTONE 25 MG PO TABS
25.0000 mg | ORAL_TABLET | Freq: Every day | ORAL | Status: DC
  Administered 2011-10-12 – 2011-10-13 (×2): 25 mg via ORAL
  Filled 2011-10-12 (×2): qty 1

## 2011-10-12 MED ORDER — HEPARIN (PORCINE) IN NACL 2-0.9 UNIT/ML-% IJ SOLN
INTRAMUSCULAR | Status: AC
  Filled 2011-10-12: qty 1000

## 2011-10-12 MED ORDER — FAMOTIDINE 20 MG PO TABS
20.0000 mg | ORAL_TABLET | Freq: Two times a day (BID) | ORAL | Status: DC
  Administered 2011-10-12 – 2011-10-13 (×2): 20 mg via ORAL
  Filled 2011-10-12 (×3): qty 1

## 2011-10-12 MED ORDER — ALLOPURINOL 300 MG PO TABS
300.0000 mg | ORAL_TABLET | Freq: Every day | ORAL | Status: DC
  Administered 2011-10-13: 300 mg via ORAL
  Filled 2011-10-12: qty 1

## 2011-10-12 MED ORDER — SUCRALFATE 1 G PO TABS
1.0000 g | ORAL_TABLET | Freq: Four times a day (QID) | ORAL | Status: DC
  Administered 2011-10-12 – 2011-10-13 (×3): 1 g via ORAL
  Filled 2011-10-12 (×7): qty 1

## 2011-10-12 MED ORDER — SODIUM CHLORIDE 0.9 % IV SOLN
INTRAVENOUS | Status: DC
  Administered 2011-10-12: 13:00:00 via INTRAVENOUS

## 2011-10-12 MED ORDER — ACETAMINOPHEN 325 MG PO TABS: 325.0000 mg | ORAL_TABLET | ORAL | Status: DC | PRN

## 2011-10-12 MED ORDER — ASPIRIN EC 81 MG PO TBEC
81.0000 mg | DELAYED_RELEASE_TABLET | Freq: Every day | ORAL | Status: DC
  Administered 2011-10-12 – 2011-10-13 (×2): 81 mg via ORAL
  Filled 2011-10-12 (×2): qty 1

## 2011-10-12 MED ORDER — TICAGRELOR 90 MG PO TABS
90.0000 mg | ORAL_TABLET | Freq: Two times a day (BID) | ORAL | Status: DC
  Administered 2011-10-12 – 2011-10-13 (×2): 90 mg via ORAL
  Filled 2011-10-12 (×3): qty 1

## 2011-10-12 MED ORDER — CEFAZOLIN SODIUM 1-5 GM-% IV SOLN
INTRAVENOUS | Status: AC
  Administered 2011-10-12: 1 g via INTRAVENOUS
  Filled 2011-10-12: qty 50

## 2011-10-12 MED ORDER — RAMIPRIL 2.5 MG PO CAPS
2.5000 mg | ORAL_CAPSULE | Freq: Two times a day (BID) | ORAL | Status: DC
  Administered 2011-10-12 – 2011-10-13 (×2): 2.5 mg via ORAL
  Filled 2011-10-12 (×3): qty 1

## 2011-10-12 MED ORDER — ASPIRIN 81 MG PO TABS: 81.0000 mg | ORAL_TABLET | Freq: Every day | ORAL | Status: DC

## 2011-10-12 MED ORDER — LIDOCAINE HCL (PF) 1 % IJ SOLN
INTRAMUSCULAR | Status: AC
  Filled 2011-10-12: qty 60

## 2011-10-12 MED ORDER — ONDANSETRON HCL 4 MG/2ML IJ SOLN: 4.0000 mg | Freq: Four times a day (QID) | INTRAMUSCULAR | Status: DC | PRN

## 2011-10-12 MED ORDER — FENTANYL CITRATE 0.05 MG/ML IJ SOLN: 25.0000 ug | INTRAMUSCULAR | Status: DC | PRN

## 2011-10-12 MED ORDER — LIDOCAINE HCL (PF) 1 % IJ SOLN
INTRAMUSCULAR | Status: AC
  Filled 2011-10-12: qty 30

## 2011-10-12 MED ORDER — GLIMEPIRIDE 1 MG PO TABS
1.0000 mg | ORAL_TABLET | Freq: Every day | ORAL | Status: DC
  Administered 2011-10-13: 1 mg via ORAL
  Filled 2011-10-12 (×2): qty 1

## 2011-10-12 MED ORDER — MUPIROCIN 2 % EX OINT
TOPICAL_OINTMENT | CUTANEOUS | Status: AC
  Filled 2011-10-12: qty 22

## 2011-10-12 NOTE — Op Note (Signed)
ICD removal/insertion of a new device and insertion of a new rate/sense lead without immediate complication. J#[id number].

## 2011-10-12 NOTE — H&P (View-Only) (Signed)
HPI [name] returns today for followup. He is a pleasant elderly man with an ICM, chronic class 2 systolic heart failure, and HTN. The patient denies c/p but does have ongoing class 2 CHF. He denies syncope or any recent ICD shocks. His ICD has reached [facility].  No Known Allergies   Current Outpatient Prescriptions  Medication Sig Dispense Refill  . allopurinol (ZYLOPRIM) 300 MG tablet Take 300 mg by mouth daily.        Marland Kitchen aspirin 81 MG tablet Take 81 mg by mouth daily.        Marland Kitchen atorvastatin (LIPITOR) 20 MG tablet Take 20 mg by mouth daily.        . bisoprolol-hydrochlorothiazide (ZIAC) 2.5-6.25 MG per tablet Take 1 tablet by mouth daily.        . carvedilol (COREG) 6.25 MG tablet Take 6.25 mg by mouth 2 (two) times daily with a meal.        . clopidogrel (PLAVIX) 75 MG tablet Take 75 mg by mouth daily.        . famotidine (PEPCID) 20 MG tablet Take 20 mg by mouth 2 (two) times daily.        Marland Kitchen glimepiride (AMARYL) 1 MG tablet Take 1 mg by mouth daily before breakfast.        . NON FORMULARY Take by mouth 2 (two) times daily. Gout medicine       . ramipril (ALTACE) 2.5 MG capsule Take 2.5 mg by mouth 2 (two) times daily.        Marland Kitchen spironolactone (ALDACTONE) 25 MG tablet Take 25 mg by mouth daily.        . sucralfate (CARAFATE) 1 G tablet Take 1 g by mouth every 6 (six) hours.        . Ticagrelor (BRILINTA) 90 MG TABS tablet Take 90 mg by mouth 2 (two) times daily.           Past Medical History  Diagnosis Date  . CAD (coronary artery disease)     s/p CABG; s/p Pacemaker    ROS:   All systems reviewed and negative except as noted in the HPI.   Past Surgical History  Procedure Date  . Coronary artery bypass graft      No family history on file.   History   Social History  . Marital Status: Divorced    Spouse Name: N/A    Number of Children: N/A  . Years of Education: N/A   Occupational History  . Not on file.   Social History Main Topics  . Smoking status: Current  Some Day Smoker    Types: Pipe, Cigars  . Smokeless tobacco: Not on file   Comment: every "now and then"  . Alcohol Use: No     occasional  . Drug Use: No  . Sexually Active: Not on file   Other Topics Concern  . Not on file   Social History Narrative  . No narrative on file     BP 142/79  Pulse 51  Ht 5\' 3"  (1.6 m)  Wt 72.576 kg (160 lb)  BMI 28.34 kg/m2  Physical Exam:  Well appearing NAD HEENT: Unremarkable Neck:  No JVD, no thyromegally Lungs:  Clear with no wheezes, rales, or rhonchi. HEART:  Regular rate rhythm, no murmurs, no rubs, no clicks Abd:  soft, positive bowel sounds, no organomegally, no rebound, no guarding Ext:  2 plus pulses, no edema, no cyanosis, no clubbing Skin:  No rashes no  nodules Neuro:  CN II through XII intact, motor grossly intact  DEVICE  Normal device function.  See PaceArt for details. He is at [facility].  Assess/Plan:

## 2011-10-12 NOTE — Interval H&P Note (Signed)
History and Physical Interval Note:  10/12/2011 1:10 PM  [name]  has presented today for surgery, with the diagnosis of EOL  The various methods of treatment have been discussed with the patient and family. After consideration of risks, benefits and other options for treatment, the patient has consented to  Procedure(s): ICD GENERATOR CHANGE and insertion of a new rate/sense lead as a surgical intervention .  The patients' history has been reviewed, patient examined, no change in status, stable for surgery.  I have reviewed the patients' chart and labs.  Questions were answered to the patient's satisfaction.     [name]

## 2011-10-13 ENCOUNTER — Other Ambulatory Visit: Payer: Self-pay

## 2011-10-13 ENCOUNTER — Ambulatory Visit (HOSPITAL_COMMUNITY): Payer: Medicare Other

## 2011-10-13 ENCOUNTER — Telehealth: Payer: Self-pay | Admitting: Internal Medicine

## 2011-10-13 ENCOUNTER — Encounter (HOSPITAL_COMMUNITY): Payer: Self-pay | Admitting: Cardiology

## 2011-10-13 NOTE — Progress Notes (Signed)
  Patient Name: [name]      SUBJECTIVE: s/p ICD generator replacement with new rate sense lead inserted   Past Medical History  Diagnosis Date  . CAD (coronary artery disease)     s/p CABG; s/p Pacemaker    PHYSICAL EXAM Filed Vitals:   10/12/11 1752 10/12/11 2000 10/13/11 0005 10/13/11 0400  BP: 133/65 120/64 109/63 110/72  Pulse: 61 65 66 55  Temp: 97.9 F (36.6 C) 97.6 F (36.4 C) 98 F (36.7 C) 97.9 F (36.6 C)  TempSrc:      Resp: 18 18 18 18   Height:      Weight:    156 lb 6.4 oz (70.943 kg)  SpO2: 97% 97% 99% 99%   Well developed and nourished in no acute distress HENT normal Neck supple with JVP-flat Pocket clear  Clear Regular rate and rhythm, no murmurs or gallops Abd-soft with active BS without hepatomegaly No Clubbing cyanosis edema Skin-warm and dry A & Oriented  Grossly normal sensory and motor function       Intake/Output Summary (Last 24 hours) at 10/13/11 1038 Last data filed at 10/13/11 0500  Gross per 24 hour  Intake      0 ml  Output    600 ml  Net   -600 ml    LABS: Device Interrogation: R wave is about 4 wth integrated bipolar sensing  ASSESSMENT AND PLAN:  stable post divcie revisin Nursing suggests case maanger consult for home health needs  Signed, Sherryl Manges MD  10/13/2011

## 2011-10-13 NOTE — Telephone Encounter (Signed)
Pt's wife calling wanting to speak to the nurse

## 2011-10-13 NOTE — Progress Notes (Signed)
Pt discharged to home per MD order. Pt s/p ICD repair-pt received all discharge instructions including medication information and activity restrictions. Pt escorted to ride at short stay, by volunteer services.   [name]

## 2011-10-13 NOTE — Telephone Encounter (Signed)
Ok per pt to talk to Terrace Arabia.  Terrace Arabia states Mr Atkison has not been taking Plavix (Clopidigrel) and Ziac (Bisoprolol/HCTZ) prior to his recent hospitalzation.  Both are listed on his d/c summary from the hospital.  She wants to make sure he is supposed to take both of them.

## 2011-10-13 NOTE — Op Note (Signed)
NAMERAJI, GLINSKI NO.:  [id number]  MEDICAL RECORD NO.:  [id number]  LOCATION:  3738                         FACILITY:  [facility]  PHYSICIAN:  Doylene Canning. Ladona Ridgel, MD    DATE OF BIRTH:  01/08/1927  DATE OF PROCEDURE:  10/12/2011 DATE OF DISCHARGE:                              OPERATIVE REPORT   PROCEDURE PERFORMED:  Insertion of a new active fixation rate/sense pacing lead in the right ventricle, removal of a previously implanted single-chamber defibrillator and insertion of a new single-chamber defibrillator with defibrillator pocket revision and defibrillator threshold testing.  INTRODUCTION:  The patient is a very pleasant 78 year old male with an ischemic cardiomyopathy and congestive heart failure.  He is status post ICD insertion secondary to all the above and VT.  The patient has reached elective replacement indication.  He is found to have very low R- waves and is referred now for insertion of a new rate/sense lead and a new defibrillator.  PROCEDURE:  After informed consent was obtained, the patient was taken to the diagnostic EP lab in a fasting state.  After usual preparation and draping, intravenous fentanyl and midazolam were given for sedation. Lidocaine 30 mL was infiltrated into the left infraclavicular region.  A 6-cm incision was carried out over this region.  Electrocautery was utilized to dissect down to the fascial plane.  The left subclavian vein was then punctured and the Medtronic model 5076, 58 cm active fixation pacing lead, serial number [id number] was advanced into the right ventricle.  Mapping was carried out and at the final site, R-waves measured 9 mV through the analyzer.  There was a large injury current with active fixation lead and the threshold was less than a V at 0.5 msec.  Pacing impedance was a 1000 ohms.  With this ventricular lead in satisfactory position, it was secured to the subpectoral fascia with a figure-of-eight  silk suture and the sewing sleeve was secured with silk suture.  Electrocautery was utilized to make subcutaneous pocket and enter the old ICD pocket.  The generator was removed.  To accommodate the different shape device and a second lead, the pocket was revised with dense fibrous adhesions freed up with electrocautery.  The leads were also freed up with electrocautery.  At this point, the Medtronic single-chamber defibrillator, serial number [id number] was connected to the old defibrillator lead and the new rate/sense lead.  The old rate/sense portion of the defibrillator lead was capped.  The device was placed back into the subcutaneous pocket.  Antibiotic irrigation was utilized to irrigate the pocket, and electrocautery was utilized to assure hemostasis.  The incision was closed with 2-0 and 3-0 Vicryl. The R-waves were decreased at 4.5 through the device.  The lead was in the same position as previously.  The patient was more deeply sedated.  After the patient was more deeply sedated with fentanyl and Versed and after I had scrubbed out to manage the airway, ventricular fibrillation was induced with a T-wave shock.  A 15-joule shock was delivered, which terminated the VF and restored sinus rhythm.  At this point, no additional defibrillation threshold testing was carried out and benzoin and Steri-Strips were painted on  the skin.  A pressure dressing was applied, and the patient was returned to his room in satisfactory condition.  COMPLICATIONS:  There were no immediate procedure complications.  RESULTS:  This demonstrates successful implantation of a new Medtronic rate/sense lead and successful removal of a previously implanted Medtronic ICD, which had reached elective replacement and insertion of a new device without immediate procedure complication.     Doylene Canning. Ladona Ridgel, MD     GWT/MEDQ  D:  10/12/2011  T:  10/13/2011  Job:  [id number]

## 2011-10-13 NOTE — Telephone Encounter (Signed)
Contact number for [place] [phone].

## 2011-10-13 NOTE — Progress Notes (Addendum)
Received  Order for home health need? Actual needs unclear and no specific orders. No PT/OT eval no skilled need for HHRN. Spoke with pt RN who thinks pt would benefit from follow up. Will ask [facility] to sent [facility] for safety eval and assessment in the home. Met with pt who agreed to [facility] visit for assessment. [facility] for [facility] assessment and eval.  Johny Shock RN MPH Case Manager [phone]     CARE MANAGEMENT NOTE 10/14/2011  Patient:  [name], [name]   Account Number:  [id number]  Date Initiated:  10/13/2011  Documentation initiated by:  Johny Shock  Subjective/Objective Assessment:   Order for [facility] needs     Action/Plan:   Spoke with pt RN and PA, unable to clarify HH need so this CM met with pt and pt agreed to [facility] for safety eval and assessment.   Anticipated DC Date:  10/13/2011   Anticipated DC Plan:  HOME W HOME HEALTH SERVICES         [facility] Choice  HOME HEALTH   Choice offered to / List presented to:  C-1 Patient        HH arranged  HH-1 RN      [facility] agency  [facility]   Status of service:  Completed, signed off Medicare Important Message given?   (If response is "NO", the following Medicare IM given date fields will be blank) Date Medicare IM given:   Date Additional Medicare IM given:    Discharge Disposition:  HOME W HOME HEALTH SERVICES  Per UR Regulation:  Reviewed for med. necessity/level of care/duration of stay  Comments:

## 2011-10-13 NOTE — Discharge Summary (Signed)
Discharge Summary   Patient ID: [name] MRN: [id number], DOB/AGE: 09/28/1926 78 y.o.  Primary MD: Pola Corn, MD Primary Cardiologist: [name] MD  Admit date: 10/12/2011 D/C date:     10/13/2011      Primary Discharge Diagnoses:  1. Ischemic CM s/p ICD  - s/p Medtronic ICD replacement w/ new rate/sense lead insertion on 10/12/11  Secondary Discharge Diagnoses:  1. Systolic CHF 2. CAD s/p CABG 3. Hypertension 4. Hyperlipidemia  Allergies No Known Allergies  Diagnostic Studies/Procedures:   10/12/11 - ICD removal/insertion of a new device and insertion of a new rate/sense lead - Medtronic model 5076, 58 cm active fixation pacing lead, serial number [id number] was advanced into the right  ventricle. - Medtronic single-chamber defibrillator, serial number [id number]  History of Present Illness: 78 y.o. male w/ PMHx significant for Systolic CHF 2/2 Ischemic CM (s/p ICD) who presented to [facility] on 10/12/11 for planned ICD generator replacement.  He was seen in clinic by Dr. Ladona Ridgel on 09/28/11 for follow up at which time interrogation of his ICD revealed it had reached ERI and very low R-waves. Plans were made for insertion of a new rate/sense lead and new defibrillator.  Hospital Course:  On 10/12/11 he underwent ICD generator replacement w/ new rate/sense lead insertion. He tolerated the procedure well without complication. Post-op CXR was without pneumothorax.   He was seen and evaluated by Dr. Graciela Husbands who felt he was stable for discharge home with plans for follow up as scheduled below.  Discharge Vitals: Blood pressure 121/67, pulse 55, temperature 97.9 F (36.6 C), temperature source Oral, resp. rate 18, height 5\' 4"  (1.626 m), weight 156 lb 6.4 oz (70.943 kg), SpO2 99.00%.  Labs: None   Discharge Medications   Medication List  As of 10/13/2011 12:06 PM   TAKE these medications         allopurinol 300 MG tablet   Commonly known as: ZYLOPRIM   Take 300 mg by mouth daily.      aspirin 81 MG tablet   Take 81 mg by mouth daily.      atorvastatin 20 MG tablet   Commonly known as: LIPITOR   Take 20 mg by mouth daily.      bisoprolol-hydrochlorothiazide 2.5-6.25 MG per tablet   Commonly known as: ZIAC   Take 1 tablet by mouth daily.      carvedilol 6.25 MG tablet   Commonly known as: COREG   Take 6.25 mg by mouth 2 (two) times daily with a meal.      clopidogrel 75 MG tablet   Commonly known as: PLAVIX   Take 75 mg by mouth daily.      famotidine 20 MG tablet   Commonly known as: PEPCID   Take 20 mg by mouth 2 (two) times daily.      glimepiride 1 MG tablet   Commonly known as: AMARYL   Take 1 mg by mouth daily before breakfast.      NON FORMULARY   Take by mouth 2 (two) times daily. Gout medicine      ramipril 2.5 MG capsule   Commonly known as: ALTACE   Take 2.5 mg by mouth 2 (two) times daily.      spironolactone 25 MG tablet   Commonly known as: ALDACTONE   Take 25 mg by mouth daily.      sucralfate 1 G tablet   Commonly known as: CARAFATE   Take 1 g by mouth every  6 (six) hours.      Ticagrelor 90 MG Tabs tablet   Commonly known as: BRILINTA   Take 90 mg by mouth 2 (two) times daily.            Disposition   Discharge Orders    Future Appointments: Provider: Department: Dept Phone: Center:   10/26/2011 2:00 PM Vella Kohler [facility] [place] [phone] [facility]   02/02/2012 10:00 AM [name], MD [facility] [phone] [facility]     Future Orders Please Complete By Expires   Diet - low sodium heart healthy      Increase activity slowly      Discharge instructions      Comments:   **PLEASE REMEMBER TO BRING ALL OF YOUR MEDICATIONS TO EACH OF YOUR FOLLOW-UP OFFICE VISITS.      Follow-up Information    Follow up with [facility] on 10/26/2011. (Device/Wound Check @ 2:00)    Contact information:   [facility] [address] [place] [address] [phone]      Follow up with [name], MD on 02/02/2012. (10:00)    Contact information:   [facility] [address] [place] [address] [phone]           Outstanding Labs/Studies: None  Duration of Discharge Encounter: Greater than 30 minutes including physician and PA time.  Signed, HOPE, JESSICA PA-C 10/13/2011, 12:06 PM

## 2011-10-24 ENCOUNTER — Telehealth: Payer: Self-pay | Admitting: Internal Medicine

## 2011-10-24 NOTE — Telephone Encounter (Signed)
Pt has pleasantly refused PT pt states he is fine and doesn't need it

## 2011-10-26 ENCOUNTER — Encounter: Payer: Self-pay | Admitting: Internal Medicine

## 2011-10-26 ENCOUNTER — Ambulatory Visit (INDEPENDENT_AMBULATORY_CARE_PROVIDER_SITE_OTHER): Payer: Medicaid Other | Admitting: *Deleted

## 2011-10-26 DIAGNOSIS — I5022 Chronic systolic (congestive) heart failure: Secondary | ICD-10-CM

## 2011-10-26 LAB — ICD DEVICE OBSERVATION
BATTERY VOLTAGE: 3.2045 V
BRDY-0002RV: 40 {beats}/min
CHARGE TIME: 2.592 s
RV LEAD AMPLITUDE: 5 mv
RV LEAD IMPEDENCE ICD: 513 Ohm
RV LEAD THRESHOLD: 0.75 V
TOT-0001: 1
TOT-0006: 20130206000000
TZAT-0011SLOWVT: 10 ms
TZAT-0011SLOWVT: 10 ms
TZAT-0012SLOWVT: 170 ms
TZAT-0012SLOWVT: 170 ms
TZAT-0018SLOWVT: NEGATIVE
TZAT-0018SLOWVT: NEGATIVE
TZAT-0019SLOWVT: 8 V
TZAT-0019SLOWVT: 8 V
TZAT-0020FASTVT: 1.5 ms
TZON-0003SLOWVT: 350 ms
TZON-0004SLOWVT: 16
TZON-0005SLOWVT: 12
TZST-0001FASTVT: 2
TZST-0001FASTVT: 3
TZST-0001SLOWVT: 4
TZST-0002FASTVT: NEGATIVE
TZST-0003SLOWVT: 15 J
TZST-0003SLOWVT: 35 J
VENTRICULAR PACING ICD: 2.24 pct

## 2011-10-26 NOTE — Progress Notes (Signed)
Wound check-ICD 

## 2011-11-03 NOTE — Telephone Encounter (Signed)
Stop plavix and Ziac. He should be on carvedilol and Brilenta.

## 2011-11-03 NOTE — Telephone Encounter (Signed)
[name] aware to take Plavix and Ziac out of his medications

## 2011-11-15 ENCOUNTER — Encounter (HOSPITAL_COMMUNITY): Payer: Self-pay | Admitting: Emergency Medicine

## 2011-11-15 ENCOUNTER — Emergency Department (INDEPENDENT_AMBULATORY_CARE_PROVIDER_SITE_OTHER)
Admission: EM | Admit: 2011-11-15 | Discharge: 2011-11-15 | Disposition: A | Discharge status: Home Health | Payer: Medicare Other | Source: Home / Self Care | Attending: Family Medicine | Admitting: Family Medicine

## 2011-11-15 DIAGNOSIS — R51 Headache: Secondary | ICD-10-CM

## 2011-11-15 DIAGNOSIS — E162 Hypoglycemia, unspecified: Secondary | ICD-10-CM

## 2011-11-15 LAB — POCT I-STAT, CHEM 8
BUN: 27 mg/dL — ABNORMAL HIGH (ref 6–23)
Calcium, Ion: 1.29 mmol/L (ref 1.12–1.32)
Creatinine, Ser: 1.4 mg/dL — ABNORMAL HIGH (ref 0.50–1.35)
Sodium: 141 mEq/L (ref 135–145)
TCO2: 19 mmol/L (ref 0–100)

## 2011-11-15 LAB — DIFFERENTIAL
Basophils Relative: 0 % (ref 0–1)
Eosinophils Absolute: 0.2 10*3/uL (ref 0.0–0.7)
Monocytes Absolute: 0.4 10*3/uL (ref 0.1–1.0)
Neutro Abs: 3.8 10*3/uL (ref 1.7–7.7)

## 2011-11-15 LAB — CBC
HCT: 29 % — ABNORMAL LOW (ref 39.0–52.0)
Hemoglobin: 10.2 g/dL — ABNORMAL LOW (ref 13.0–17.0)
MCH: 30.9 pg (ref 26.0–34.0)
MCHC: 35.2 g/dL (ref 30.0–36.0)
MCV: 87.9 fL (ref 78.0–100.0)

## 2011-11-15 NOTE — ED Provider Notes (Signed)
History     CSN: [id number]  Arrival date & time 11/15/11  1525   First MD Initiated Contact with Patient 11/15/11 1613      Chief Complaint  Patient presents with  . Headache    (Consider location/radiation/quality/duration/timing/severity/associated sxs/prior treatment) HPI Comments: 78 year old male with history of coronary artery disease, hypertension and hyperlipidemia among other co morbidities. Here complaining of bilateral temporal headache he describes as electric shooting type pain either on the right or left temple recurrent and frequent has been present for 2 days. Denies blurry vision, photophobia, or any visual changes. Denies fever or chills. Denies jaw claudication. Denies difficulties with extremity movement. Denies body aches, also denies nausea or vomiting. Denies recent fall or head injury. Has had some tremmors and sweats associated with his headaches. Was started on glimepiride 1mg  daily and oxycodone on last admission. Ran out Oxycodone 5 days ago and was started on Celebrex for arthritis pain. Dr. Sharyn Lull and Dr. Shana Chute (cardiology) are following him but he denies having a primary care provider.  Was told about 2 weeks ago by his home nurse he needs earwax plugs removed. State he had mild upper respiratory symptoms last week that are now resolved. Denies nasal congestion or rhinorrhea. No cough, shortness of breath or chest pain, no leg swelling. Denies ear drainage or sinus pain. Appetite at baseline.   Past Medical History  Diagnosis Date  . CAD (coronary artery disease)     s/p CABG; s/p Pacemaker  . Systolic CHF with reduced left ventricular function, NYHA class 2   . Ischemic cardiomyopathy     s/p ICD  . Hypertension   . Hyperlipidemia     Past Surgical History  Procedure Date  . Coronary artery bypass graft   . Cardiac defibrillator placement     generator change 10/12/11    No family history on file.  History  Substance Use Topics  . Smoking  status: Current Some Day Smoker    Types: Pipe, Cigars  . Smokeless tobacco: Not on file   Comment: every "now and then"  . Alcohol Use: No     occasional      Review of Systems  Constitutional: Negative for fever, chills, diaphoresis, appetite change and fatigue.  HENT: Negative for ear pain, congestion, sore throat, rhinorrhea, neck pain, tinnitus and ear discharge.   Eyes: Negative for visual disturbance.  Respiratory: Negative for cough, chest tightness, shortness of breath and wheezing.   Cardiovascular: Negative for chest pain, palpitations and leg swelling.  Gastrointestinal: Negative for abdominal pain.  Genitourinary: Negative for dysuria and frequency.  Musculoskeletal: Negative for myalgias, joint swelling and gait problem.  Skin: Negative for rash.  Neurological: Positive for headaches.    Allergies  Review of patient's allergies indicates no known allergies.  Home Medications   Current Outpatient Rx  Name Route Sig Dispense Refill  . CELECOXIB 100 MG PO CAPS Oral Take 100 mg by mouth 2 (two) times daily. PT WILL RESTART TOMORROW PER PCP    . ALLOPURINOL 300 MG PO TABS Oral Take 300 mg by mouth daily.      . ASPIRIN 81 MG PO TABS Oral Take 81 mg by mouth daily.      . ATORVASTATIN CALCIUM 20 MG PO TABS Oral Take 20 mg by mouth daily.      Marland Kitchen CARVEDILOL 6.25 MG PO TABS Oral Take 6.25 mg by mouth 2 (two) times daily with a meal.      . FAMOTIDINE 20 MG  PO TABS Oral Take 20 mg by mouth 2 (two) times daily.      . NON FORMULARY Oral Take by mouth 2 (two) times daily. Gout medicine    . RAMIPRIL 2.5 MG PO CAPS Oral Take 2.5 mg by mouth 2 (two) times daily.      Marland Kitchen SPIRONOLACTONE 25 MG PO TABS Oral Take 25 mg by mouth daily.      . SUCRALFATE 1 G PO TABS Oral Take 1 g by mouth 4 (four) times daily.     Marland Kitchen TICAGRELOR 90 MG PO TABS Oral Take 90 mg by mouth 2 (two) times daily.        BP 126/68  Pulse 67  Temp(Src) 98.1 F (36.7 C) (Oral)  Resp 18  SpO2 96%  Physical  Exam  Nursing note and vitals reviewed. Constitutional: He is oriented to person, place, and time. He appears well-developed and well-nourished. No distress.  HENT:  Head: Normocephalic and atraumatic.  Right Ear: External ear normal.  Left Ear: External ear normal.  Nose: Nose normal.  Mouth/Throat: Oropharynx is clear and moist. No oropharyngeal exudate.       Upper and lower dentures. TM's look normal after ear wax plugs removed by ear irrigation.  Eyes: Conjunctivae and EOM are normal. Pupils are equal, round, and reactive to light. Right eye exhibits no discharge. Left eye exhibits no discharge. No scleral icterus.       Left eye s/p cataract surgery. Scar looks well healed.   Neck: Normal range of motion. Neck supple. No JVD present. No thyromegaly present.  Cardiovascular: Normal rate and regular rhythm.   Pulmonary/Chest: Effort normal. No respiratory distress. He has no wheezes. He has no rales. He exhibits no tenderness.  Abdominal: Soft. Bowel sounds are normal. He exhibits no distension. There is no tenderness.  Musculoskeletal: He exhibits no edema and no tenderness.  Lymphadenopathy:    He has no cervical adenopathy.  Neurological: He is alert and oriented to person, place, and time. No cranial nerve deficit. He exhibits normal muscle tone. Coordination normal.  Skin: No rash noted.    ED Course  Procedures (including critical care time)  Labs Reviewed  SEDIMENTATION RATE - Abnormal; Notable for the following:    Sed Rate 75 (*)    All other components within normal limits  CBC - Abnormal; Notable for the following:    RBC 3.30 (*)    Hemoglobin 10.2 (*)    HCT 29.0 (*)    All other components within normal limits  POCT I-STAT, CHEM 8 - Abnormal; Notable for the following:    BUN 27 (*)    Creatinine, Ser 1.40 (*)    Glucose, Bld 60 (*)    Hemoglobin 10.2 (*)    HCT 30.0 (*)    All other components within normal limits  DIFFERENTIAL  GLUCOSE, CAPILLARY  LAB  REPORT - SCANNED   No results found.   1. Hypoglycemia   2. Headache       MDM  78 y/o cardiopath looks clinically well and physical exam is reassuring. Vitals are stable. He is not orthostatic. His sed rate is elevated but there is low probability for entities like temporal arteritis (temporal following generalized headache is bilateral with no visual changes, malaise , muscle or body aches. No fever, chills or jaw discomfort, also no focal tenderness or engorgement of scalp vessels noted with palpation).  Elliptocytosis and other co morbidities like gout and RA could be affecting a non  specific increased sed rate. His anemia low hemoglobin/hto has been stable since Sept/'12. My impression is that his headache is likely related to medications side effects like hypoglycemia as has had few associated episodes of sweat and tremor while questioning patient further. Pt or home nurse are not checking CBG's at home. His CBG was inicially 60 here today improved to 90's with soda intake. Patient was started on glimepiride on Sep/'12 with an HgA1C= 7.5 (no follow up HgA1C on records). Also reports taking oxycodone almost daily for about 1 month and stopped about 5 days ago. I decided to hold glimepiride and ask patient to continue taking plain tylenol for pain. Check CBG daily and have diabetes follow up in 1-2 weeks. He can return here for follow up 1 time if cannot get an appointment with a PCP at [facility]. Will try to contact case manager. Pt. And companion friend were advised to go to the ED if any new symptoms like malaise , dizziness, fever, visual changes, weakness or worsening symptoms.        Sharin Grave, MD 11/16/11 1136

## 2011-11-15 NOTE — ED Notes (Signed)
SPRITE GIVEN FOR LOW GLUCOSE.WILL MONITOR

## 2011-11-15 NOTE — Discharge Instructions (Signed)
It is possible your headaches are related to low sugar levels. I've recommended just stopped taking glimepiride and checked her sugar fasting daily for the next week keep a log to take BP at your next doctor's appointment. We'll contact you if any of the remaining labs are abnormal. Can take Tylenol 500 mg every 6 hours as needed for headache. You need to have your sugars recheck in one or 2 weeks call the provided number to find a primary care provider at the [facility] group you can also return here or followup with Dr. Sharyn Lull for diabetes followup in the next one or 2 weeks. Go to the emergency department if worsening headache or any new symptoms like visual changes from baseline, tingling no numbness in-your-face or your extremities, difficulties understanding or producing speech, difficulty moving your arms or legs or weakness, difficulties with your balance or gait, fever or chills sweats or dizziness.

## 2011-11-15 NOTE — ED Notes (Signed)
PT HERE WITH C/O INTERMITT SHARP PAIN TO BILAT TEMPORAL RADIATING WITH HEADACHE THAT STARTED X 2 DYS AGO.PT STATES THE PAINS WORSENS WITH SHARP MOVEMENT OF HEAD FROM SIDE/SIDE.DENIES BLURRY VISION OR SENSITIVITY TO LIGHT.PT ALSO STATES HE WAS TOLD BY PCP HE NEEDED EARS IRRIGATED.TOOK TYLENOL FOR PAIN WHICH WAS EFFECTIVE

## 2011-11-16 ENCOUNTER — Telehealth (HOSPITAL_COMMUNITY): Payer: Self-pay | Admitting: *Deleted

## 2011-11-16 NOTE — ED Notes (Addendum)
Dr. Tressia Danas said pt. needs to f/u with a PCP in 1 week. He has a cardiologist Dr. Sharyn Lull but needs a PCP.  She referred him to [facility] [employer]. She will check him if he can't be seen in 1 week. Pt. needs to check his sugar everyday. Check to see if pt. has an Charity fundraiser or Sports coach. I called pt. and he does not have an Charity fundraiser or case Production designer, theatre/television/film. I gave him this information. He asked me to give the information to his friend Mcneil Sober. She said she would call today to try and schedule his appt. She said he got a Rx. from Dr. Sharyn Lull last week for a glucose meter but has not gotten it yet.  She asked me to tell this to the pt. again. I told pt. 1) see a doctor in 1 week- come back here if necessary. 2) get your meter and check your sugar everyday. Drink OJ or eat some food if it is below 60 and 3) Take his regular medicine every day. If he does not do these he could end up in the ED. Pt. voiced understanding.   Ms. Sedonia Small called back and said Corinda Gubler could not see him because of insurance. She asked me to check it. I told her he has Medicaid [place] Access and he has to see the doctor on his card. She can't find the card in his wallet. I accessed his demographics and told her it was [facility]. She said Dr. Sharyn Lull is on the phone and can't talk now. I told her that was all I wanted to tell her. Vassie Moselle 11/16/2011

## 2012-02-02 ENCOUNTER — Encounter: Payer: Self-pay | Admitting: Internal Medicine

## 2012-02-02 ENCOUNTER — Ambulatory Visit (INDEPENDENT_AMBULATORY_CARE_PROVIDER_SITE_OTHER): Payer: Medicare Other | Admitting: Internal Medicine

## 2012-02-02 VITALS — BP 116/68 | HR 52 | Ht 64.0 in | Wt 152.0 lb

## 2012-02-02 DIAGNOSIS — Z95810 Presence of automatic (implantable) cardiac defibrillator: Secondary | ICD-10-CM

## 2012-02-02 DIAGNOSIS — I2510 Atherosclerotic heart disease of native coronary artery without angina pectoris: Secondary | ICD-10-CM

## 2012-02-02 DIAGNOSIS — I5022 Chronic systolic (congestive) heart failure: Secondary | ICD-10-CM

## 2012-02-02 LAB — ICD DEVICE OBSERVATION
DEV-0020ICD: NEGATIVE
FVT: 0
RV LEAD IMPEDENCE ICD: 418 Ohm
RV LEAD THRESHOLD: 0.75 V
TOT-0001: 1
TOT-0002: 0
TZAT-0001FASTVT: 1
TZAT-0001SLOWVT: 1
TZAT-0011SLOWVT: 10 ms
TZAT-0011SLOWVT: 10 ms
TZAT-0018FASTVT: NEGATIVE
TZAT-0018SLOWVT: NEGATIVE
TZAT-0018SLOWVT: NEGATIVE
TZAT-0019FASTVT: 8 V
TZAT-0019SLOWVT: 8 V
TZAT-0019SLOWVT: 8 V
TZAT-0020FASTVT: 1.5 ms
TZAT-0020SLOWVT: 1.5 ms
TZAT-0020SLOWVT: 1.5 ms
TZON-0005SLOWVT: 12
TZST-0001FASTVT: 2
TZST-0001FASTVT: 5
TZST-0001SLOWVT: 4
TZST-0002FASTVT: NEGATIVE
TZST-0003SLOWVT: 35 J
TZST-0003SLOWVT: 35 J
VENTRICULAR PACING ICD: 1.3 pct

## 2012-02-02 NOTE — Assessment & Plan Note (Signed)
He denies anginal symptoms. He will continue his current medical therapy and increase his physical activity.

## 2012-02-02 NOTE — Progress Notes (Signed)
HPI Mr. Dehner returns today for followup. He is a pleasant 78 yo man with an ischemic cardiomyopathy, ventricular tachycardia, status post ICD implantation. He underwent generator change several months ago. He returns today for followup. His main complaint is gout. He is on medical therapy. He has curtailed his physical activity. No chest pain, shortness of breath, or syncope. No ICD shock. No Known Allergies   Current Outpatient Prescriptions  Medication Sig Dispense Refill  . allopurinol (ZYLOPRIM) 300 MG tablet Take 300 mg by mouth daily.        Marland Kitchen aspirin 81 MG tablet Take 81 mg by mouth daily.        Marland Kitchen atorvastatin (LIPITOR) 20 MG tablet Take 20 mg by mouth daily.        . carvedilol (COREG) 6.25 MG tablet Take 6.25 mg by mouth 2 (two) times daily with a meal.        . celecoxib (CELEBREX) 100 MG capsule Take 100 mg by mouth 2 (two) times daily. PT WILL RESTART TOMORROW PER PCP      . famotidine (PEPCID) 20 MG tablet Take 20 mg by mouth 2 (two) times daily.        . NON FORMULARY Take by mouth 2 (two) times daily. Gout medicine      . ramipril (ALTACE) 2.5 MG capsule Take 2.5 mg by mouth 2 (two) times daily.        Marland Kitchen spironolactone (ALDACTONE) 25 MG tablet Take 25 mg by mouth daily.        . sucralfate (CARAFATE) 1 G tablet Take 1 g by mouth 4 (four) times daily.       . Ticagrelor (BRILINTA) 90 MG TABS tablet Take 90 mg by mouth 2 (two) times daily.        Marland Kitchen DISCONTD: glimepiride (AMARYL) 1 MG tablet Take 1 mg by mouth daily before breakfast.           Past Medical History  Diagnosis Date  . CAD (coronary artery disease)     s/p CABG; s/p Pacemaker  . Systolic CHF with reduced left ventricular function, NYHA class 2   . Ischemic cardiomyopathy     s/p ICD  . Hypertension   . Hyperlipidemia     ROS:   All systems reviewed and negative except as noted in the HPI.   Past Surgical History  Procedure Date  . Coronary artery bypass graft   . Cardiac defibrillator placement       generator change 10/12/11     No family history on file.   History   Social History  . Marital Status: Divorced    Spouse Name: N/A    Number of Children: N/A  . Years of Education: N/A   Occupational History  . Not on file.   Social History Main Topics  . Smoking status: Current Some Day Smoker    Types: Pipe, Cigars  . Smokeless tobacco: Not on file   Comment: every "now and then"  . Alcohol Use: No     occasional  . Drug Use: No  . Sexually Active: Not on file   Other Topics Concern  . Not on file   Social History Narrative  . No narrative on file     BP 116/68  Pulse 52  Ht 5\' 4"  (1.626 m)  Wt 152 lb (68.947 kg)  BMI 26.09 kg/m2  Physical Exam:  Well appearing elderly man, NAD HEENT: Unremarkable Neck:  No JVD, no thyromegally Lungs:  Clear with no wheezes, rales, or rhonchi. Well-healed ICD incision. HEART:  Regular rate rhythm, no murmurs, no rubs, no clicks Abd:  soft, positive bowel sounds, no organomegally, no rebound, no guarding Ext:  2 plus pulses, no edema, no cyanosis, no clubbing Skin:  No rashes no nodules Neuro:  CN II through XII intact, motor grossly intact  DEVICE  Normal device function.  See PaceArt for details.   Assess/Plan:

## 2012-02-02 NOTE — Patient Instructions (Signed)
Your physician recommends that you schedule a follow-up appointment in: 3 months in the device clinic and 12 months with Dr Taylor  

## 2012-02-02 NOTE — Assessment & Plan Note (Signed)
His device is working normally. We'll plan to recheck in several months. 

## 2012-02-02 NOTE — Assessment & Plan Note (Signed)
His congestive heart failure symptoms are class II. I've encouraged the patient to increase his physical activity. He will continue his current medical therapy.

## 2012-02-25 ENCOUNTER — Encounter (HOSPITAL_COMMUNITY): Payer: Self-pay

## 2012-02-25 ENCOUNTER — Emergency Department (HOSPITAL_COMMUNITY)
Admission: EM | Admit: 2012-02-25 | Discharge: 2012-02-25 | Disposition: A | Discharge status: Home / Self Care | Payer: Medicare Other | Attending: Emergency Medicine | Admitting: Emergency Medicine

## 2012-02-25 ENCOUNTER — Encounter (HOSPITAL_COMMUNITY): Payer: Self-pay | Admitting: *Deleted

## 2012-02-25 ENCOUNTER — Emergency Department (INDEPENDENT_AMBULATORY_CARE_PROVIDER_SITE_OTHER)
Admission: EM | Admit: 2012-02-25 | Discharge: 2012-02-25 | Disposition: A | Discharge status: Nursing Home (Includes ICF) | Payer: Medicare Other | Source: Home / Self Care | Attending: Family Medicine | Admitting: Family Medicine

## 2012-02-25 DIAGNOSIS — I10 Essential (primary) hypertension: Secondary | ICD-10-CM | POA: Insufficient documentation

## 2012-02-25 DIAGNOSIS — I5020 Unspecified systolic (congestive) heart failure: Secondary | ICD-10-CM | POA: Insufficient documentation

## 2012-02-25 DIAGNOSIS — I498 Other specified cardiac arrhythmias: Secondary | ICD-10-CM | POA: Insufficient documentation

## 2012-02-25 DIAGNOSIS — Z79899 Other long term (current) drug therapy: Secondary | ICD-10-CM | POA: Insufficient documentation

## 2012-02-25 DIAGNOSIS — R001 Bradycardia, unspecified: Secondary | ICD-10-CM

## 2012-02-25 DIAGNOSIS — I2510 Atherosclerotic heart disease of native coronary artery without angina pectoris: Secondary | ICD-10-CM | POA: Insufficient documentation

## 2012-02-25 DIAGNOSIS — E785 Hyperlipidemia, unspecified: Secondary | ICD-10-CM | POA: Insufficient documentation

## 2012-02-25 DIAGNOSIS — R42 Dizziness and giddiness: Secondary | ICD-10-CM | POA: Insufficient documentation

## 2012-02-25 DIAGNOSIS — E119 Type 2 diabetes mellitus without complications: Secondary | ICD-10-CM | POA: Insufficient documentation

## 2012-02-25 DIAGNOSIS — I495 Sick sinus syndrome: Secondary | ICD-10-CM

## 2012-02-25 DIAGNOSIS — I509 Heart failure, unspecified: Secondary | ICD-10-CM | POA: Insufficient documentation

## 2012-02-25 HISTORY — DX: Gout, unspecified: M10.9

## 2012-02-25 LAB — BASIC METABOLIC PANEL
CO2: 21 mEq/L (ref 19–32)
Calcium: 9.3 mg/dL (ref 8.4–10.5)
Creatinine, Ser: 1.31 mg/dL (ref 0.50–1.35)
Glucose, Bld: 123 mg/dL — ABNORMAL HIGH (ref 70–99)

## 2012-02-25 LAB — DIFFERENTIAL
Basophils Absolute: 0 10*3/uL (ref 0.0–0.1)
Eosinophils Absolute: 0.2 10*3/uL (ref 0.0–0.7)
Eosinophils Relative: 7 % — ABNORMAL HIGH (ref 0–5)

## 2012-02-25 LAB — POCT I-STAT TROPONIN I: Troponin i, poc: 0.01 ng/mL (ref 0.00–0.08)

## 2012-02-25 LAB — CBC
MCH: 32 pg (ref 26.0–34.0)
MCV: 91.2 fL (ref 78.0–100.0)
Platelets: 122 10*3/uL — ABNORMAL LOW (ref 150–400)
RDW: 14.8 % (ref 11.5–15.5)

## 2012-02-25 NOTE — ED Provider Notes (Signed)
History     CSN: [id number]  Arrival date & time 02/25/12  2005   First MD Initiated Contact with Patient 02/25/12 2019      Chief Complaint  Patient presents with  . Bradycardia  . Pacemaker Problem   Cards: Sharyn Lull and Ladona Ridgel (Consider location/radiation/quality/duration/timing/severity/associated sxs/prior treatment) HPI This 78 year old male has a few days of intermittent lightheaded spells which last a few seconds at a time. He has had these episodes several times a day for the last few days. He states he gets up too fast to sit up too fast or stand up too fast for a few seconds he feel lightheaded and generally weak. The symptoms passed very quickly. He has not had any palpitations chest pain or shortness breath. He has no vertigo or headache. He is no change in speech vision swallowing or understanding. He is no lateralizing weakness numbness or incoordination. He has no falls and no syncope. He has no trauma. He was seen at an urgent care and they sent him to the ED because his pulse rate was in the low 40s. He is an IVCD to low ejection fraction but has not felt any discharges. His ICD was last checked a few weeks ago and was working normally. He feels normal now upon arrival to the ED. Past Medical History  Diagnosis Date  . CAD (coronary artery disease)     s/p CABG; s/p Pacemaker  . Systolic CHF with reduced left ventricular function, NYHA class 2   . Ischemic cardiomyopathy     s/p ICD  . Hypertension   . Hyperlipidemia   . Pacemaker   . Diabetes mellitus   . Gout     Past Surgical History  Procedure Date  . Coronary artery bypass graft   . Cardiac defibrillator placement     generator change 10/12/11  . Bowel resection     No family history on file.  History  Substance Use Topics  . Smoking status: Current Some Day Smoker    Types: Pipe, Cigars  . Smokeless tobacco: Not on file   Comment: every "now and then"  . Alcohol Use: No     occasional       Review of Systems  Constitutional: Negative for fever.       10 Systems reviewed and are negative for acute change except as noted in the HPI.  HENT: Negative for congestion.   Eyes: Negative for discharge and redness.  Respiratory: Negative for cough and shortness of breath.   Cardiovascular: Negative for chest pain and palpitations.  Gastrointestinal: Negative for vomiting and abdominal pain.  Musculoskeletal: Negative for back pain.  Skin: Negative for rash.  Neurological: Positive for light-headedness. Negative for syncope, weakness, numbness and headaches.  Psychiatric/Behavioral:       No behavior change.    Allergies  Review of patient's allergies indicates no known allergies.  Home Medications   Current Outpatient Rx  Name Route Sig Dispense Refill  . ALLOPURINOL 300 MG PO TABS Oral Take 300 mg by mouth daily.      . ASPIRIN 81 MG PO TABS Oral Take 81 mg by mouth daily.      . ATORVASTATIN CALCIUM 20 MG PO TABS Oral Take 20 mg by mouth daily.      Marland Kitchen CARVEDILOL 6.25 MG PO TABS Oral Take 6.25 mg by mouth 2 (two) times daily with a meal.      . CELECOXIB 100 MG PO CAPS Oral Take 100 mg by mouth  2 (two) times daily. PT WILL RESTART TOMORROW PER PCP    . FAMOTIDINE 20 MG PO TABS Oral Take 20 mg by mouth 2 (two) times daily.      Marland Kitchen GLIMEPIRIDE 1 MG PO TABS Oral Take 1 mg by mouth daily before breakfast.    . NITROGLYCERIN 0.4 MG SL SUBL Sublingual Place 0.4 mg under the tongue every 5 (five) minutes as needed. For chest pain. Do not exceed 3 tablets in 15 minutes    . OXYCODONE-ACETAMINOPHEN PO Oral Take 1 tablet by mouth every 6 (six) hours as needed. For pain    . RAMIPRIL 2.5 MG PO CAPS Oral Take 2.5 mg by mouth 2 (two) times daily.      Marland Kitchen SPIRONOLACTONE 25 MG PO TABS Oral Take 25 mg by mouth daily.      . SUCRALFATE 1 G PO TABS Oral Take 1 g by mouth 4 (four) times daily.     Marland Kitchen TICAGRELOR 90 MG PO TABS Oral Take 90 mg by mouth 2 (two) times daily.        BP 118/52   Pulse 88  Temp 98 F (36.7 C) (Oral)  Resp 15  Ht 5\' 4"  (1.626 m)  Wt 159 lb (72.122 kg)  BMI 27.29 kg/m2  SpO2 100%  Physical Exam  Nursing note and vitals reviewed. Constitutional:       Awake, alert, nontoxic appearance with baseline speech for patient.  HENT:  Head: Atraumatic.  Mouth/Throat: No oropharyngeal exudate.  Eyes: EOM are normal. Pupils are equal, round, and reactive to light. Right eye exhibits no discharge. Left eye exhibits no discharge.  Neck: Neck supple.  Cardiovascular: Normal rate and regular rhythm.   No murmur heard. Pulmonary/Chest: Effort normal and breath sounds normal. No stridor. No respiratory distress. He has no wheezes. He has no rales. He exhibits no tenderness.  Abdominal: Soft. Bowel sounds are normal. He exhibits no mass. There is no tenderness. There is no rebound.  Musculoskeletal: He exhibits no tenderness.       Baseline ROM, moves extremities with no obvious new focal weakness.  Lymphadenopathy:    He has no cervical adenopathy.  Neurological: He is alert.       Awake, alert, cooperative and aware of situation; motor strength bilaterally; sensation normal to light touch bilaterally; peripheral visual fields full to confrontation; no facial asymmetry; tongue midline; major cranial nerves appear intact; no pronator drift, normal finger to nose bilaterally  Skin: No rash noted.  Psychiatric: He has a normal mood and affect.    ED Course  Procedures (including critical care time) ECG: Sinus bradycardia, ventricular rate 51, first degree AV block, occasional premature ventricular complex, no acute ischemic changes noted, no significant change noted compared with February 2013 Labs Reviewed  BASIC METABOLIC PANEL - Abnormal; Notable for the following:    Glucose, Bld 123 (*)     BUN 37 (*)     GFR calc non Af Amer 49 (*)     GFR calc Af Amer 56 (*)     All other components within normal limits  CBC - Abnormal; Notable for the following:     WBC 3.5 (*)     RBC 3.31 (*)     Hemoglobin 10.6 (*)     HCT 30.2 (*)     Platelets 122 (*)     All other components within normal limits  DIFFERENTIAL - Abnormal; Notable for the following:    Eosinophils Relative 7 (*)  All other components within normal limits  POCT I-STAT TROPONIN I  LAB REPORT - SCANNED   No results found.   1. Lightheadedness       MDM  Pt stable in ED with no significant deterioration in condition.Patient / Family / Caregiver informed of clinical course, understand medical decision-making process, and agree with plan.        Hurman Horn, MD 03/01/12 [phone]

## 2012-02-25 NOTE — ED Notes (Signed)
Pt placed on cardiac monitor: variable heart rate, period pacing, HR 39 - 72 spO2 100% on 3L O2 by nasal cannula CBG performed 116 dL/mg

## 2012-02-25 NOTE — ED Notes (Signed)
Pt from [facility] by [facility] with reports of having dizziness x 3-4 days and mild shortness of breath. Pt denies any chest pain or n/v/d, pt has pacemaker with heart rate in 40's, pt is asymptomatic. Pt has 20G in right wrist by U[facility], pt denies any pain or complaints at this time.

## 2012-02-25 NOTE — Discharge Instructions (Signed)
Please read and follow all provided instructions.  Your diagnoses today include:  1. Lightheadedness     Tests performed today include:  Blood tests  EKG - showed slow heart beat  Test for heart attack - was normal  Vital signs. See below for your results today.   Medications prescribed:   None  Home care instructions:  Follow any educational materials contained in this packet.  I spoke with Dr. Algie Coffer tonight. You should do the following:  Do not take the medicine carvedilol (also called Coreg) 6.25mg  tomorrow or on Monday.  Ask Dr. Sharyn Lull during your previously scheduled appointment on Tuesday if you should start taking this medicine again.   Follow-up instructions: Please follow-up with Dr. Sharyn Lull as planned in 2 days for further evaluation of your symptoms. If you do not have a primary care doctor -- see below for referral information.   Return instructions:   Please return to the Emergency Department if you experience worsening symptoms.   Return if you pass out, feel weak, or experience chest pain  Please return if you have any other emergent concerns.  Additional Information:  Your vital signs today were: BP 118/72  Pulse 61  Temp 98 F (36.7 C) (Oral)  Resp 16  Ht 5\' 4"  (1.626 m)  Wt 159 lb (72.122 kg)  BMI 27.29 kg/m2  SpO2 100% If your blood pressure (BP) was elevated above 135/85 this visit, please have this repeated by your doctor within one month. -------------- No Primary Care Doctor Call [facility]  [phone] Other agencies that provide inexpensive medical care    [facility]  [phone]    [facility]  [phone]    [facility]  [phone]    [facility]  [phone]    [facility]  [phone]    [facility]  [phone] -------------- RESOURCE GUIDE:  Dental Problems  Patients with Medicaid: [facility] [phone] [address]                                             [phone] Phone:  [phone]                                                   Phone:  [phone]  If unable to pay or uninsured, contact:  [facility] or [facility]. to become qualified for the adult dental clinic.  Chronic Pain Problems Contact [facility]  [phone] Patients need to be referred by their primary care doctor.  Insufficient Money for Medicine Contact [employer]:  call "211" or [facility] [phone].  Psychological Services [facility] Behavioral Health  [phone] [facility]  [phone] [facility] Mental Health   [phone] (emergency services [phone])  Substance Abuse Resources [facility]  [phone] [facility] [phone] [place] [phone] Floydene Flock [phone] Residential & Outpatient Substance Abuse Program  [phone]  Abuse/Neglect [facility] Child Abuse Hotline [phone] [facility] Child Abuse Hotline [phone] (After Hours)  Emergency Shelter [facility] (  [phone]  M[facility] Homes [facility] [place] [facility] [place] [phone] [employer] Services [phone]  [facility] Resources  [facility]     [employer]                          [facility]. [address] [place]                       [address]      [address]  Blondell Reveal Phone:  [phone]                                   Phone:  [phone]                 Phone:  [phone]  [facility] Mental Health Phone:  [phone]  [facility] Child Abuse Hotline [phone] [phone] (After Hours)

## 2012-02-25 NOTE — ED Provider Notes (Signed)
History     CSN: [id number]  Arrival date & time 02/25/12  2005   First MD Initiated Contact with Patient 02/25/12 2019      Chief Complaint  Patient presents with  . Bradycardia  . Pacemaker Problem    (Consider location/radiation/quality/duration/timing/severity/associated sxs/prior treatment) HPI  Past Medical History  Diagnosis Date  . CAD (coronary artery disease)     s/p CABG; s/p Pacemaker  . Systolic CHF with reduced left ventricular function, NYHA class 2   . Ischemic cardiomyopathy     s/p ICD  . Hypertension   . Hyperlipidemia   . Pacemaker   . Diabetes mellitus   . Gout     Past Surgical History  Procedure Date  . Coronary artery bypass graft   . Cardiac defibrillator placement     generator change 10/12/11  . Bowel resection     No family history on file.  History  Substance Use Topics  . Smoking status: Current Some Day Smoker    Types: Pipe, Cigars  . Smokeless tobacco: Not on file   Comment: every "now and then"  . Alcohol Use: No     occasional      Review of Systems  Allergies  Review of patient's allergies indicates no known allergies.  Home Medications   Current Outpatient Rx  Name Route Sig Dispense Refill  . ALLOPURINOL 300 MG PO TABS Oral Take 300 mg by mouth daily.      . ASPIRIN 81 MG PO TABS Oral Take 81 mg by mouth daily.      . ATORVASTATIN CALCIUM 20 MG PO TABS Oral Take 20 mg by mouth daily.      Marland Kitchen CARVEDILOL 6.25 MG PO TABS Oral Take 6.25 mg by mouth 2 (two) times daily with a meal.      . CELECOXIB 100 MG PO CAPS Oral Take 100 mg by mouth 2 (two) times daily. PT WILL RESTART TOMORROW PER PCP    . FAMOTIDINE 20 MG PO TABS Oral Take 20 mg by mouth 2 (two) times daily.      Marland Kitchen GLIMEPIRIDE 1 MG PO TABS Oral Take 1 mg by mouth daily before breakfast.    . NITROGLYCERIN 0.4 MG SL SUBL Sublingual Place 0.4 mg under the tongue every 5 (five) minutes as needed. For chest pain. Do not exceed 3 tablets in 15 minutes    .  OXYCODONE-ACETAMINOPHEN PO Oral Take 1 tablet by mouth every 6 (six) hours as needed. For pain    . RAMIPRIL 2.5 MG PO CAPS Oral Take 2.5 mg by mouth 2 (two) times daily.      Marland Kitchen SPIRONOLACTONE 25 MG PO TABS Oral Take 25 mg by mouth daily.      . SUCRALFATE 1 G PO TABS Oral Take 1 g by mouth 4 (four) times daily.     Marland Kitchen TICAGRELOR 90 MG PO TABS Oral Take 90 mg by mouth 2 (two) times daily.        BP 118/72  Pulse 61  Temp 98 F (36.7 C) (Oral)  Resp 16  Ht 5\' 4"  (1.626 m)  Wt 159 lb (72.122 kg)  BMI 27.29 kg/m2  SpO2 100%  Physical Exam  ED Course  Procedures (including critical care time)  Labs Reviewed  BASIC METABOLIC PANEL - Abnormal; Notable for the following:    Glucose, Bld 123 (*)     BUN 37 (*)     GFR calc non Af Amer 49 (*)  GFR calc Af Amer 56 (*)     All other components within normal limits  CBC - Abnormal; Notable for the following:    WBC 3.5 (*)     RBC 3.31 (*)     Hemoglobin 10.6 (*)     HCT 30.2 (*)     Platelets 122 (*)     All other components within normal limits  DIFFERENTIAL - Abnormal; Notable for the following:    Eosinophils Relative 7 (*)     All other components within normal limits  POCT I-STAT TROPONIN I   No results found.   1. Lightheadedness     10:06 PM Patient seen and examined. Labs reviewed. Will call Dr. Sharyn Lull for reccs.    Vital signs reviewed and are as follows: Filed Vitals:   02/25/12 2135  BP: 118/72  Pulse: 61  Temp:   Resp:   BP 118/72  Pulse 61  Temp 98 F (36.7 C) (Oral)  Resp 16  Ht 5\' 4"  (1.626 m)  Wt 159 lb (72.122 kg)  BMI 27.29 kg/m2  SpO2 100%  10:47 PM I spoke with Dr. Algie Coffer who is covering for Dr. Sharyn Lull. Will hold carvedilol for 2 days. Patient states he has an appointment to see Dr. Sharyn Lull in 2 days. Patient is to follow-up and address his symptoms then. Patient has a friend who helps him with his medications and will assist him tomorrow.   Patient told to return with worsening  lightheadedness, syncope, chest pain, other concerns. He agrees with plan and states he understands.   MDM  Lightheadedness, bradycardia. Pacemaker recently checked, does not pace unless HR < 40. Assumed to be working normally. Patient asymptomatic in ED. Will hold BB temporarily and have patient follow-up with PCP/cardiologist.         Renne Crigler, PA 02/25/12 2251

## 2012-02-25 NOTE — ED Notes (Signed)
Pt with c/o dizziness x 2 - 3 days - pace maker - cm shows sinus brady with 1st degree block -  Pacer not capturing

## 2012-02-25 NOTE — ED Provider Notes (Signed)
History     CSN: [id number]  Arrival date & time 02/25/12  4098   First MD Initiated Contact with Patient 02/25/12 1826      Chief Complaint  Patient presents with  . Dizziness    (Consider location/radiation/quality/duration/timing/severity/associated sxs/prior treatment) Patient is a 78 y.o. male presenting with weakness. The history is provided by the patient.  Weakness The primary symptoms include dizziness. Primary symptoms do not include headaches. Primary symptoms comment: near syncopal sx., no chest pain. The symptoms began 2 days ago. The symptoms are unchanged.  Dizziness also occurs with weakness.  Additional symptoms include weakness.    Past Medical History  Diagnosis Date  . CAD (coronary artery disease)     s/p CABG; s/p Pacemaker  . Systolic CHF with reduced left ventricular function, NYHA class 2   . Ischemic cardiomyopathy     s/p ICD  . Hypertension   . Hyperlipidemia   . Pacemaker   . Diabetes mellitus   . Gout     Past Surgical History  Procedure Date  . Coronary artery bypass graft   . Cardiac defibrillator placement     generator change 10/12/11  . Bowel resection     History reviewed. No pertinent family history.  History  Substance Use Topics  . Smoking status: Current Some Day Smoker    Types: Pipe, Cigars  . Smokeless tobacco: Not on file   Comment: every "now and then"  . Alcohol Use: No     occasional      Review of Systems  Respiratory: Negative for cough and chest tightness.   Cardiovascular: Negative for chest pain.  Neurological: Positive for dizziness, weakness and light-headedness. Negative for syncope and headaches.    Allergies  Review of patient's allergies indicates no known allergies.  Home Medications   Current Outpatient Rx  Name Route Sig Dispense Refill  . ALLOPURINOL 300 MG PO TABS Oral Take 300 mg by mouth daily.      . ASPIRIN 81 MG PO TABS Oral Take 81 mg by mouth daily.      . ATORVASTATIN CALCIUM  20 MG PO TABS Oral Take 20 mg by mouth daily.      Marland Kitchen CARVEDILOL 6.25 MG PO TABS Oral Take 6.25 mg by mouth 2 (two) times daily with a meal.      . FAMOTIDINE 20 MG PO TABS Oral Take 20 mg by mouth 2 (two) times daily.      . CELECOXIB 100 MG PO CAPS Oral Take 100 mg by mouth 2 (two) times daily. PT WILL RESTART TOMORROW PER PCP    . NON FORMULARY Oral Take by mouth 2 (two) times daily. Gout medicine    . RAMIPRIL 2.5 MG PO CAPS Oral Take 2.5 mg by mouth 2 (two) times daily.      Marland Kitchen SPIRONOLACTONE 25 MG PO TABS Oral Take 25 mg by mouth daily.      . SUCRALFATE 1 G PO TABS Oral Take 1 g by mouth 4 (four) times daily.     Marland Kitchen TICAGRELOR 90 MG PO TABS Oral Take 90 mg by mouth 2 (two) times daily.        BP 115/52  Pulse 58  Temp 97.8 F (36.6 C) (Oral)  Resp 18  SpO2 99%  Physical Exam  Nursing note and vitals reviewed. Constitutional: He is oriented to person, place, and time. He appears well-developed and well-nourished. No distress.  HENT:  Head: Normocephalic.  Right Ear: External ear normal.  Left Ear: External ear normal.  Mouth/Throat: Oropharynx is clear and moist.  Eyes: Pupils are equal, round, and reactive to light.  Neck: Normal range of motion. Neck supple.  Cardiovascular: Regular rhythm, normal heart sounds and intact distal pulses.  Bradycardia present.   Pulmonary/Chest: Effort normal and breath sounds normal.       Pacemaker left chest   Abdominal: Bowel sounds are normal. There is no tenderness.       Midline surg scar.  Musculoskeletal: He exhibits no edema.  Lymphadenopathy:    He has no cervical adenopathy.  Neurological: He is alert and oriented to person, place, and time.  Skin: Skin is warm and dry.    ED Course  Procedures (including critical care time)  Labs Reviewed - No data to display No results found.   1. Bradycardia, sinus, persistent, severe       MDM  ecg-- noncapturing severe bradycardia.        Linna Hoff, MD 02/25/12  1945

## 2012-02-26 NOTE — ED Provider Notes (Signed)
Medical screening examination/treatment/procedure(s) were conducted as a shared visit with non-physician practitioner(s) and myself.  I personally evaluated the patient during the encounter  [name] Horn, MD 02/26/12 1251

## 2012-05-02 ENCOUNTER — Encounter: Payer: Self-pay | Admitting: Internal Medicine

## 2012-05-02 ENCOUNTER — Ambulatory Visit (INDEPENDENT_AMBULATORY_CARE_PROVIDER_SITE_OTHER): Payer: Medicare Other | Admitting: *Deleted

## 2012-05-02 DIAGNOSIS — I428 Other cardiomyopathies: Secondary | ICD-10-CM

## 2012-05-02 DIAGNOSIS — I5022 Chronic systolic (congestive) heart failure: Secondary | ICD-10-CM

## 2012-05-02 LAB — ICD DEVICE OBSERVATION
CHARGE TIME: 8.438 s
PACEART VT: 0
RV LEAD AMPLITUDE: 4.125 mv
RV LEAD IMPEDENCE ICD: 456 Ohm
TOT-0002: 0
TOT-0006: 20130206000000
TZAT-0002FASTVT: NEGATIVE
TZAT-0005SLOWVT: 84 pct
TZAT-0005SLOWVT: 91 pct
TZAT-0011SLOWVT: 10 ms
TZAT-0011SLOWVT: 10 ms
TZAT-0013SLOWVT: 2
TZAT-0013SLOWVT: 2
TZAT-0018SLOWVT: NEGATIVE
TZAT-0018SLOWVT: NEGATIVE
TZAT-0019FASTVT: 8 V
TZAT-0020FASTVT: 1.5 ms
TZON-0004SLOWVT: 16
TZON-0005SLOWVT: 12
TZST-0001FASTVT: 3
TZST-0001SLOWVT: 4
TZST-0001SLOWVT: 6
TZST-0002FASTVT: NEGATIVE
TZST-0002FASTVT: NEGATIVE
TZST-0002FASTVT: NEGATIVE
TZST-0003SLOWVT: 25 J
TZST-0003SLOWVT: 35 J

## 2012-05-02 NOTE — Progress Notes (Signed)
ICD check with ICM 

## 2012-06-06 ENCOUNTER — Ambulatory Visit (HOSPITAL_COMMUNITY)
Admission: RE | Admit: 2012-06-06 | Discharge: 2012-06-06 | Disposition: A | Discharge status: Home / Self Care | Payer: Medicare Other | Source: Ambulatory Visit | Attending: Cardiology | Admitting: Cardiology

## 2012-06-06 DIAGNOSIS — I70299 Other atherosclerosis of native arteries of extremities, unspecified extremity: Secondary | ICD-10-CM | POA: Insufficient documentation

## 2012-06-06 DIAGNOSIS — R209 Unspecified disturbances of skin sensation: Secondary | ICD-10-CM

## 2012-06-06 DIAGNOSIS — I2510 Atherosclerotic heart disease of native coronary artery without angina pectoris: Secondary | ICD-10-CM | POA: Insufficient documentation

## 2012-06-06 DIAGNOSIS — R238 Other skin changes: Secondary | ICD-10-CM | POA: Insufficient documentation

## 2012-06-06 DIAGNOSIS — M7989 Other specified soft tissue disorders: Secondary | ICD-10-CM | POA: Insufficient documentation

## 2012-06-06 NOTE — Progress Notes (Signed)
VASCULAR LAB PRELIMINARY  ARTERIAL = Calcific plaque is seen throughout right lower extremity arterial system. >50% stenosis of the right proximal posterior tibial artery. No other significant stenosis noted.     RIGHT    LEFT    PRESSURE WAVEFORM  PRESSURE WAVEFORM  BRACHIAL 121 Tri BRACHIAL 107 Tri  DP   DP    AT 50 Mono  AT 56 Mono   PT 71 Mono  PT 150 Bi  PER   PER    GREAT TOE  NA GREAT TOE  NA    RIGHT LEFT  ABI 0.46 1.24     Farrel Demark, RDMS, RVT  06/06/2012, 10:49 AM

## 2012-06-07 DIAGNOSIS — Z48812 Encounter for surgical aftercare following surgery on the circulatory system: Secondary | ICD-10-CM

## 2012-06-07 DIAGNOSIS — R0989 Other specified symptoms and signs involving the circulatory and respiratory systems: Secondary | ICD-10-CM

## 2012-06-21 ENCOUNTER — Encounter (HOSPITAL_COMMUNITY): Payer: Self-pay | Admitting: Nurse Practitioner

## 2012-06-21 ENCOUNTER — Emergency Department (HOSPITAL_COMMUNITY): Payer: Medicare Other

## 2012-06-21 ENCOUNTER — Emergency Department (HOSPITAL_COMMUNITY)
Admission: EM | Admit: 2012-06-21 | Discharge: 2012-06-21 | Disposition: A | Discharge status: Home / Self Care | Payer: Medicare Other | Attending: Emergency Medicine | Admitting: Emergency Medicine

## 2012-06-21 DIAGNOSIS — E119 Type 2 diabetes mellitus without complications: Secondary | ICD-10-CM | POA: Insufficient documentation

## 2012-06-21 DIAGNOSIS — M25519 Pain in unspecified shoulder: Secondary | ICD-10-CM | POA: Insufficient documentation

## 2012-06-21 DIAGNOSIS — F17200 Nicotine dependence, unspecified, uncomplicated: Secondary | ICD-10-CM | POA: Insufficient documentation

## 2012-06-21 DIAGNOSIS — Z950 Presence of cardiac pacemaker: Secondary | ICD-10-CM | POA: Insufficient documentation

## 2012-06-21 DIAGNOSIS — M109 Gout, unspecified: Secondary | ICD-10-CM | POA: Insufficient documentation

## 2012-06-21 DIAGNOSIS — I10 Essential (primary) hypertension: Secondary | ICD-10-CM | POA: Insufficient documentation

## 2012-06-21 DIAGNOSIS — I2510 Atherosclerotic heart disease of native coronary artery without angina pectoris: Secondary | ICD-10-CM | POA: Insufficient documentation

## 2012-06-21 DIAGNOSIS — E785 Hyperlipidemia, unspecified: Secondary | ICD-10-CM | POA: Insufficient documentation

## 2012-06-21 LAB — CBC WITH DIFFERENTIAL/PLATELET
Eosinophils Relative: 0 % (ref 0–5)
HCT: 36.7 % — ABNORMAL LOW (ref 39.0–52.0)
Hemoglobin: 12.8 g/dL — ABNORMAL LOW (ref 13.0–17.0)
Lymphocytes Relative: 8 % — ABNORMAL LOW (ref 12–46)
Lymphs Abs: 0.6 10*3/uL — ABNORMAL LOW (ref 0.7–4.0)
MCV: 93.1 fL (ref 78.0–100.0)
Monocytes Absolute: 0.9 10*3/uL (ref 0.1–1.0)
Monocytes Relative: 11 % (ref 3–12)
RBC: 3.94 MIL/uL — ABNORMAL LOW (ref 4.22–5.81)
RDW: 13.5 % (ref 11.5–15.5)
WBC: 8 10*3/uL (ref 4.0–10.5)

## 2012-06-21 LAB — BASIC METABOLIC PANEL
CO2: 18 mEq/L — ABNORMAL LOW (ref 19–32)
Chloride: 101 mEq/L (ref 96–112)
Creatinine, Ser: 1.04 mg/dL (ref 0.50–1.35)
Potassium: 4.1 mEq/L (ref 3.5–5.1)

## 2012-06-21 MED ORDER — OXYCODONE-ACETAMINOPHEN 5-325 MG PO TABS: 1.0000 | ORAL_TABLET | Freq: Four times a day (QID) | ORAL | Status: DC | PRN

## 2012-06-21 MED ORDER — OXYCODONE-ACETAMINOPHEN 5-325 MG PO TABS
1.0000 | ORAL_TABLET | Freq: Once | ORAL | Status: AC
  Administered 2012-06-21: 1 via ORAL
  Filled 2012-06-21: qty 1

## 2012-06-21 NOTE — ED Provider Notes (Signed)
History     CSN: [id number]  Arrival date & time 06/21/12  1319   First MD Initiated Contact with Patient 06/21/12 1418    And  Chief Complaint  Patient presents with  . Generalized Body Aches    (Consider location/radiation/quality/duration/timing/severity/associated sxs/prior treatment) The history is provided by the patient.    Patient presents with pain in his bilateral shoulders and neck. His been there since Sunday. It is worse with movement. No trauma. No numbness or weakness. He states it hurts to move his arms. He states he's not able to expose medications due to the pain. No fevers. He has an occasional cough without production. No chest pain. For numbness in his arms and legs. He no fall or increased exertion. He states that he had some gout pain in his foot and right knee but that is improving.  Past Medical History  Diagnosis Date  . CAD (coronary artery disease)     s/p CABG; s/p Pacemaker  . Systolic CHF with reduced left ventricular function, NYHA class 2   . Ischemic cardiomyopathy     s/p ICD  . Hypertension   . Hyperlipidemia   . Pacemaker   . Diabetes mellitus   . Gout     Past Surgical History  Procedure Date  . Coronary artery bypass graft   . Cardiac defibrillator placement     generator change 10/12/11  . Bowel resection   . Coronary angioplasty with stent placement     History reviewed. No pertinent family history.  History  Substance Use Topics  . Smoking status: Current Some Day Smoker    Types: Pipe, Cigars  . Smokeless tobacco: Not on file   Comment: every "now and then"  . Alcohol Use: No     occasional      Review of Systems  Constitutional: Negative for chills.  HENT: Negative for neck pain.   Respiratory: Negative for choking.   Gastrointestinal: Negative for abdominal pain.  Genitourinary: Negative for flank pain.  Musculoskeletal: Negative for myalgias, back pain and joint swelling.  Skin: Negative for pallor, rash and  wound.  Neurological: Negative for weakness and numbness.    Allergies  Review of patient's allergies indicates no known allergies.  Home Medications   Current Outpatient Rx  Name Route Sig Dispense Refill  . ALLOPURINOL 300 MG PO TABS Oral Take 300 mg by mouth daily.      . ASPIRIN 81 MG PO TABS Oral Take 81 mg by mouth daily.      . ATORVASTATIN CALCIUM 20 MG PO TABS Oral Take 20 mg by mouth daily.      Marland Kitchen CARVEDILOL 6.25 MG PO TABS Oral Take 3.125 mg by mouth 2 (two) times daily with a meal.     . FAMOTIDINE 20 MG PO TABS Oral Take 20 mg by mouth 2 (two) times daily.      Marland Kitchen RAMIPRIL 2.5 MG PO CAPS Oral Take 2.5 mg by mouth 2 (two) times daily.      Marland Kitchen SPIRONOLACTONE 25 MG PO TABS Oral Take 25 mg by mouth daily.      . SUCRALFATE 1 G PO TABS Oral Take 1 g by mouth 4 (four) times daily.     Marland Kitchen TICAGRELOR 90 MG PO TABS Oral Take 90 mg by mouth 2 (two) times daily.      Marland Kitchen NITROGLYCERIN 0.4 MG SL SUBL Sublingual Place 0.4 mg under the tongue every 5 (five) minutes as needed. For chest pain. Do  not exceed 3 tablets in 15 minutes    . OXYCODONE-ACETAMINOPHEN 5-325 MG PO TABS Oral Take 1-2 tablets by mouth every 6 (six) hours as needed for pain. 15 tablet 0    BP 153/68  Pulse 78  Temp 97.7 F (36.5 C) (Oral)  Resp 20  SpO2 99%  Physical Exam  Nursing note and vitals reviewed. Constitutional: He is oriented to person, place, and time. He appears well-developed and well-nourished.  HENT:  Head: Normocephalic and atraumatic.  Eyes: EOM are normal. Pupils are equal, round, and reactive to light.  Neck: Normal range of motion. Neck supple.  Cardiovascular: Normal rate, regular rhythm and normal heart sounds.   No murmur heard. Pulmonary/Chest: Effort normal and breath sounds normal.  Abdominal: Soft. Bowel sounds are normal. He exhibits no distension and no mass. There is no tenderness. There is no rebound and no guarding.  Musculoskeletal: Normal range of motion. He exhibits no edema.         Tenderness over bilateral trapezius and paraspinal muscles. No midline tenderness. Range of motion intact her neck.  Neurological: He is alert and oriented to person, place, and time. No cranial nerve deficit.  Skin: Skin is warm and dry.  Psychiatric: He has a normal mood and affect.    ED Course  Procedures (including critical care time)  Labs Reviewed  BASIC METABOLIC PANEL - Abnormal; Notable for the following:    CO2 18 (*)     Glucose, Bld 142 (*)     GFR calc non Af Amer 64 (*)     GFR calc Af Amer 75 (*)     All other components within normal limits  CBC WITH DIFFERENTIAL - Abnormal; Notable for the following:    RBC 3.94 (*)     Hemoglobin 12.8 (*)     HCT 36.7 (*)     Neutrophils Relative 81 (*)     Lymphocytes Relative 8 (*)     Lymphs Abs 0.6 (*)     All other components within normal limits  TROPONIN I   Dg Chest 2 View  06/21/2012  *RADIOLOGY REPORT*  Clinical Data: Shortness of breath with the neck and arm pain  CHEST - 2 VIEW  Comparison: 10/13/2011  Findings: The defibrillator is again noted and stable. Postsurgical changes are seen.  Cardiac size shadow is stable the lungs are clear bilaterally.  The tiny nodular densities noted between the anterior aspects of the second and third ribs on the right.  This likely represents a small calcified granuloma.  Follow- up examination in 3 months is recommended to assess for stability.  IMPRESSION: The tiny nodule in the right upper lobe likely representing a granuloma.  Follow-up in 3 months is recommended to assess for stability.   Original Report Authenticated By: Phillips Odor, M.D.      1. Shoulder pain      Date: 06/21/2012  Rate: 81  Rhythm: indeterminate  QRS Axis: normal  Intervals: pwaves not visible  ST/T Wave abnormalities: nonspecific ST/T changes  Conduction Disutrbances:right bundle branch block  Narrative Interpretation:   Old EKG Reviewed: unchanged    MDM  Patient with pain in his  shoulders and neck. Reproducible and worse with palpation. EKG and lab works reassuring. I doubt cardiac cause for this.        Juliet Rude. Rubin Payor, MD 06/21/12 1742

## 2012-06-21 NOTE — ED Notes (Signed)
C/o "gout pain" in bilateral shoulders and neck since Sunday. Could not take gout meds because he said his arms hurt too bad to reach for it.

## 2012-06-21 NOTE — ED Notes (Signed)
Pt presents with 2 day h/o neck and bilateral shoulder pain that is now generalized pain.  Pt reports dry cough, and shortness of breath.  Pt reports gout pain to L foot and R knee last week, reports that pain is resolved.  Pt denies any chest discomfort.

## 2012-06-21 NOTE — ED Notes (Signed)
Harlon Ditty (neighbor) [phone]

## 2012-06-22 ENCOUNTER — Emergency Department (HOSPITAL_COMMUNITY)
Admission: EM | Admit: 2012-06-22 | Discharge: 2012-06-22 | Disposition: A | Discharge status: Home / Self Care | Payer: Medicare Other | Attending: Emergency Medicine | Admitting: Emergency Medicine

## 2012-06-22 ENCOUNTER — Other Ambulatory Visit: Payer: Self-pay

## 2012-06-22 ENCOUNTER — Encounter (HOSPITAL_COMMUNITY): Payer: Self-pay | Admitting: Emergency Medicine

## 2012-06-22 DIAGNOSIS — E119 Type 2 diabetes mellitus without complications: Secondary | ICD-10-CM | POA: Insufficient documentation

## 2012-06-22 DIAGNOSIS — Z950 Presence of cardiac pacemaker: Secondary | ICD-10-CM | POA: Insufficient documentation

## 2012-06-22 DIAGNOSIS — I2510 Atherosclerotic heart disease of native coronary artery without angina pectoris: Secondary | ICD-10-CM | POA: Insufficient documentation

## 2012-06-22 DIAGNOSIS — I5020 Unspecified systolic (congestive) heart failure: Secondary | ICD-10-CM | POA: Insufficient documentation

## 2012-06-22 DIAGNOSIS — I10 Essential (primary) hypertension: Secondary | ICD-10-CM | POA: Insufficient documentation

## 2012-06-22 DIAGNOSIS — R911 Solitary pulmonary nodule: Secondary | ICD-10-CM | POA: Insufficient documentation

## 2012-06-22 DIAGNOSIS — M109 Gout, unspecified: Secondary | ICD-10-CM | POA: Insufficient documentation

## 2012-06-22 DIAGNOSIS — E785 Hyperlipidemia, unspecified: Secondary | ICD-10-CM | POA: Insufficient documentation

## 2012-06-22 DIAGNOSIS — M542 Cervicalgia: Secondary | ICD-10-CM

## 2012-06-22 DIAGNOSIS — Z951 Presence of aortocoronary bypass graft: Secondary | ICD-10-CM | POA: Insufficient documentation

## 2012-06-22 DIAGNOSIS — I509 Heart failure, unspecified: Secondary | ICD-10-CM | POA: Insufficient documentation

## 2012-06-22 DIAGNOSIS — F17200 Nicotine dependence, unspecified, uncomplicated: Secondary | ICD-10-CM | POA: Insufficient documentation

## 2012-06-22 MED ORDER — DIAZEPAM 5 MG PO TABS: 5.0000 mg | ORAL_TABLET | Freq: Three times a day (TID) | ORAL | Status: DC | PRN

## 2012-06-22 MED ORDER — ASPIRIN 325 MG PO TABS: 325.0000 mg | ORAL_TABLET | ORAL | Status: DC

## 2012-06-22 NOTE — ED Notes (Signed)
Pt states that he is having bilateral shoulder and neck pain with movement.  No distress noted.

## 2012-06-22 NOTE — ED Provider Notes (Signed)
History     CSN: [id number]  Arrival date & time 06/22/12  1504   First MD Initiated Contact with Patient 06/22/12 1613      Chief Complaint  Patient presents with  . Neck Pain  . Shoulder Pain    (Consider location/radiation/quality/duration/timing/severity/associated sxs/prior treatment) HPI This 78yo has several days of constant 24-hour a day neck pain worse with palpation and movement of his upper arms, he is no chest pain cough shortness breath abdominal pain nausea vomiting or diaphoresis. He is no trauma. He is no focal weakness or numbness. He is no change in bowel or bladder function. He is no pain to his arms or pain in his legs or weakness or numbness to his legs. He was seen yesterday in the emergency room for the same thing has had minimal if any improvement with Percocet. He lives at home alone at baseline has generalized weakness and walks with a walker at home or a cane. He still is able to walk with his walker and cane today. His pain is just not improved last several days despite being seen yesterday in the ED and trying Percocet last night before bed and Percocet again this morning. He states that we are trying to get out of bed this morning he would reach his arm out to grab something and feel a sudden sharp spasm in his muscle causing worsening pain for several minutes at a time. He does not specifically feel midline neck pain at all. Yesterday he had unremarkable troponin other labs and chest x-ray and EKG. Past Medical History  Diagnosis Date  . CAD (coronary artery disease)     s/p CABG; s/p Pacemaker  . Systolic CHF with reduced left ventricular function, NYHA class 2   . Ischemic cardiomyopathy     s/p ICD  . Hypertension   . Hyperlipidemia   . Pacemaker   . Diabetes mellitus   . Gout     Past Surgical History  Procedure Date  . Coronary artery bypass graft   . Cardiac defibrillator placement     generator change 10/12/11  . Bowel resection   . Coronary  angioplasty with stent placement     No family history on file.  History  Substance Use Topics  . Smoking status: Current Some Day Smoker    Types: Pipe, Cigars  . Smokeless tobacco: Not on file   Comment: every "now and then"  . Alcohol Use: No     occasional      Review of Systems 10 Systems reviewed and are negative for acute change except as noted in the HPI. Allergies  Review of patient's allergies indicates no known allergies.  Home Medications   Current Outpatient Rx  Name Route Sig Dispense Refill  . OXYCODONE-ACETAMINOPHEN 5-325 MG PO TABS Oral Take 1-2 tablets by mouth every 6 (six) hours as needed for pain. 15 tablet 0  . ALLOPURINOL 300 MG PO TABS Oral Take 300 mg by mouth daily.      . ASPIRIN 81 MG PO TABS Oral Take 81 mg by mouth daily.      . ATORVASTATIN CALCIUM 20 MG PO TABS Oral Take 20 mg by mouth daily.      Marland Kitchen CARVEDILOL 6.25 MG PO TABS Oral Take 3.125 mg by mouth 2 (two) times daily with a meal.     . DIAZEPAM 5 MG PO TABS Oral Take 1 tablet (5 mg total) by mouth every 8 (eight) hours as needed (spasms). 10 tablet  0  . FAMOTIDINE 20 MG PO TABS Oral Take 20 mg by mouth 2 (two) times daily.      Marland Kitchen NITROGLYCERIN 0.4 MG SL SUBL Sublingual Place 0.4 mg under the tongue every 5 (five) minutes as needed. For chest pain. Do not exceed 3 tablets in 15 minutes    . RAMIPRIL 2.5 MG PO CAPS Oral Take 2.5 mg by mouth 2 (two) times daily.      Marland Kitchen SPIRONOLACTONE 25 MG PO TABS Oral Take 25 mg by mouth daily.      . SUCRALFATE 1 G PO TABS Oral Take 1 g by mouth 4 (four) times daily.     Marland Kitchen TICAGRELOR 90 MG PO TABS Oral Take 90 mg by mouth 2 (two) times daily.        BP 114/79  Pulse 98  Temp 98.7 F (37.1 C) (Oral)  Resp 16  Ht 5\' 4"  (1.626 m)  Wt 157 lb (71.215 kg)  BMI 26.95 kg/m2  SpO2 96%  Physical Exam  Nursing note and vitals reviewed. Constitutional:       Awake, alert, nontoxic appearance.  HENT:  Head: Atraumatic.  Eyes: Right eye exhibits no  discharge. Left eye exhibits no discharge.  Neck: Neck supple.  Cardiovascular: Normal rate and regular rhythm.   No murmur heard. Pulmonary/Chest: Effort normal and breath sounds normal. No respiratory distress. He has no wheezes. He has no rales. He exhibits no tenderness.  Abdominal: Soft. Bowel sounds are normal. He exhibits no mass. There is no tenderness. There is no rebound and no guarding.  Musculoskeletal: He exhibits tenderness. He exhibits no edema.       Baseline ROM, no obvious new focal weakness.  Neck and back show no midline tenderness his back is nontender his neck is bilateral paracervical and bilateral trapezius soft tissue tenderness without rash or swelling or deformity noted. Both of her Chinese are nontender at the shoulders elbows wrists and hands. Both upper extremities have normal light touch capillary refill less than 2 seconds intact light touch as well as intact 55 motor strength in the distributions of the axillary, median, radial, and ulnar nerve function. Both legs are nontender with good range of motion.  Neurological: He is alert.       Mental status and motor strength appears baseline for patient and situation.  Skin: No rash noted.  Psychiatric: He has a normal mood and affect.    ED Course  Procedures (including critical care time) ECG: Suspect sinus rhythm with first degree AV block with right bundle branch block and multiple premature ventricular complexes, no acute ischemic changes noted, no significant change noted compared with June 2013   Patient lives alone but does have a walker, he does have followup already planned with his primary care doctor, he agrees it appears reasonable to discharge him today and have [facility] contact him or have him contact [facility] tomorrow for a wellness check.  Pt also advised to get repeat CXR in 3 mos per Rads recommendations from yesterday's film; added to written discharge instructions too after  printed out form.  Labs Reviewed  LAB REPORT - SCANNED   No results found.   1. Neck pain, bilateral   2. Lung nodule seen on imaging study       MDM  Pt stable in ED with no significant deterioration in condition.Patient / Family / Caregiver informed of clinical course, understand medical decision-making process, and agree with plan.  Hurman Horn, MD 06/23/12 [phone]

## 2012-06-22 NOTE — ED Notes (Signed)
Patient c/o bilateral  neck and shoulder pain that started 3 days ago. Patient denies any nausea, SOB, or sweating.  Patient was seen yesterday here in [facility] for the same symptoms.

## 2012-06-24 ENCOUNTER — Emergency Department (HOSPITAL_COMMUNITY): Payer: Medicare Other

## 2012-06-24 ENCOUNTER — Encounter (HOSPITAL_COMMUNITY): Payer: Self-pay

## 2012-06-24 ENCOUNTER — Emergency Department (HOSPITAL_COMMUNITY)
Admission: EM | Admit: 2012-06-24 | Discharge: 2012-06-24 | Disposition: A | Discharge status: Home / Self Care | Payer: Medicare Other | Attending: Emergency Medicine | Admitting: Emergency Medicine

## 2012-06-24 DIAGNOSIS — M25519 Pain in unspecified shoulder: Secondary | ICD-10-CM | POA: Insufficient documentation

## 2012-06-24 DIAGNOSIS — Z95810 Presence of automatic (implantable) cardiac defibrillator: Secondary | ICD-10-CM | POA: Insufficient documentation

## 2012-06-24 DIAGNOSIS — E119 Type 2 diabetes mellitus without complications: Secondary | ICD-10-CM | POA: Insufficient documentation

## 2012-06-24 DIAGNOSIS — Z79899 Other long term (current) drug therapy: Secondary | ICD-10-CM | POA: Insufficient documentation

## 2012-06-24 DIAGNOSIS — M542 Cervicalgia: Secondary | ICD-10-CM | POA: Insufficient documentation

## 2012-06-24 DIAGNOSIS — I25810 Atherosclerosis of coronary artery bypass graft(s) without angina pectoris: Secondary | ICD-10-CM | POA: Insufficient documentation

## 2012-06-24 DIAGNOSIS — M25511 Pain in right shoulder: Secondary | ICD-10-CM

## 2012-06-24 DIAGNOSIS — I10 Essential (primary) hypertension: Secondary | ICD-10-CM | POA: Insufficient documentation

## 2012-06-24 LAB — URINALYSIS, ROUTINE W REFLEX MICROSCOPIC
Hgb urine dipstick: NEGATIVE
Protein, ur: 30 mg/dL — AB
Urobilinogen, UA: 1 mg/dL (ref 0.0–1.0)

## 2012-06-24 LAB — CBC WITH DIFFERENTIAL/PLATELET
Basophils Absolute: 0 10*3/uL (ref 0.0–0.1)
Basophils Relative: 0 % (ref 0–1)
Eosinophils Absolute: 0.1 10*3/uL (ref 0.0–0.7)
Eosinophils Relative: 1 % (ref 0–5)
Lymphocytes Relative: 10 % — ABNORMAL LOW (ref 12–46)
MCV: 90.9 fL (ref 78.0–100.0)
Platelets: 211 10*3/uL (ref 150–400)
RDW: 13.2 % (ref 11.5–15.5)
WBC: 5.5 10*3/uL (ref 4.0–10.5)

## 2012-06-24 LAB — COMPREHENSIVE METABOLIC PANEL
ALT: 26 U/L (ref 0–53)
AST: 46 U/L — ABNORMAL HIGH (ref 0–37)
Albumin: 2.6 g/dL — ABNORMAL LOW (ref 3.5–5.2)
Calcium: 9.4 mg/dL (ref 8.4–10.5)
Sodium: 134 mEq/L — ABNORMAL LOW (ref 135–145)
Total Protein: 6.6 g/dL (ref 6.0–8.3)

## 2012-06-24 LAB — POCT I-STAT TROPONIN I: Troponin i, poc: 0.03 ng/mL (ref 0.00–0.08)

## 2012-06-24 MED ORDER — MELOXICAM 15 MG PO TABS: 15.0000 mg | ORAL_TABLET | Freq: Every day | ORAL | Status: DC

## 2012-06-24 NOTE — ED Notes (Addendum)
Informed PA that Troponin, CBC, and CMP resulted.

## 2012-06-24 NOTE — ED Provider Notes (Signed)
Medical screening examination/treatment/procedure(s) were conducted as a shared visit with non-physician practitioner(s) and myself.  I personally evaluated the patient during the encounter   Loren Racer, MD 06/24/12 2337

## 2012-06-24 NOTE — ED Notes (Signed)
Pts neighbor will come back to pick him up when discharged. Harlon Ditty [phone] or [phone]

## 2012-06-24 NOTE — ED Notes (Signed)
Pt presents with NAD- denies injury- c/o of bil shoulder pain greater on the rt side- DX with gout- pt given medications no relief- Seen and treated at [facility] this past week for presenting complaint

## 2012-06-24 NOTE — ED Provider Notes (Signed)
History     CSN: [id number]  Arrival date & time 06/24/12  1513   First MD Initiated Contact with Patient 06/24/12 1603      Chief Complaint  Patient presents with  . Shoulder Pain    (Consider location/radiation/quality/duration/timing/severity/associated sxs/prior treatment) HPI Comments: The ED with chief complaint of neck and right shoulder pain. This is his third visit in the past 4 days for the same complaint. Patient is a poor historian and much of history is gathered from medical records. He states that he has significant pain on the right side of his neck and in the shoulder with neck movements and shoulder movement.  Today. He was unable to get out of his bed due to the pain and had trouble reaching for the phone for help. One of his neighbors found him. neck pain worse with palpation and movement of his upper arms, he has no chest pain cough shortness breath abdominal pain nausea vomiting or diaphoresis. No known MOI. He has no focal neuro deficits. Denies paresthesias or neuropathic type pain. He has no change in bowel or bladder function. He was seen previously in the emergency room for the same thing has had no Percocet.has generalized weakness but able to walk with his walker and cane today.   Negative labs and chest x-ray and EKG on 10/17.    Patient is a 78 y.o. male presenting with shoulder pain. The history is provided by the patient and medical records. No language interpreter was used.  Shoulder Pain Associated symptoms include neck pain. Pertinent negatives include no abdominal pain, arthralgias, chest pain, chills, diaphoresis, fever, headaches, joint swelling, myalgias, nausea, rash or vomiting.    Past Medical History  Diagnosis Date  . CAD (coronary artery disease)     s/p CABG; s/p Pacemaker  . Systolic CHF with reduced left ventricular function, NYHA class 2   . Ischemic cardiomyopathy     s/p ICD  . Hypertension   . Hyperlipidemia   . Pacemaker   .  Diabetes mellitus   . Gout     Past Surgical History  Procedure Date  . Coronary artery bypass graft   . Cardiac defibrillator placement     generator change 10/12/11  . Bowel resection   . Coronary angioplasty with stent placement     No family history on file.  History  Substance Use Topics  . Smoking status: Current Some Day Smoker    Types: Pipe, Cigars  . Smokeless tobacco: Not on file   Comment: every "now and then"  . Alcohol Use: No     occasional      Review of Systems  Constitutional: Negative for fever, chills and diaphoresis.  HENT: Positive for neck pain. Negative for facial swelling, mouth sores, trouble swallowing and voice change.   Eyes: Negative for photophobia and visual disturbance.  Respiratory: Negative for chest tightness and shortness of breath.   Cardiovascular: Negative for chest pain, palpitations and leg swelling.  Gastrointestinal: Negative for nausea, vomiting, abdominal pain and abdominal distention.  Genitourinary: Negative for dysuria, hematuria and flank pain.  Musculoskeletal: Negative for myalgias, back pain, joint swelling and arthralgias.  Skin: Negative for rash.  Neurological: Negative for seizures, facial asymmetry, light-headedness and headaches.  Psychiatric/Behavioral: Negative for confusion.  All other systems reviewed and are negative.    Allergies  Review of patient's allergies indicates no known allergies.  Home Medications   Current Outpatient Rx  Name Route Sig Dispense Refill  . ALLOPURINOL 300  MG PO TABS Oral Take 300 mg by mouth daily.      . ASPIRIN 81 MG PO TABS Oral Take 81 mg by mouth daily.      . ATORVASTATIN CALCIUM 20 MG PO TABS Oral Take 20 mg by mouth daily.      Marland Kitchen CARVEDILOL 6.25 MG PO TABS Oral Take 3.125 mg by mouth 2 (two) times daily with a meal.     . DIAZEPAM 5 MG PO TABS Oral Take 1 tablet (5 mg total) by mouth every 8 (eight) hours as needed (spasms). 10 tablet 0  . FAMOTIDINE 20 MG PO TABS  Oral Take 20 mg by mouth 2 (two) times daily.      . OXYCODONE-ACETAMINOPHEN 5-325 MG PO TABS Oral Take 1-2 tablets by mouth every 6 (six) hours as needed for pain. 15 tablet 0  . RAMIPRIL 2.5 MG PO CAPS Oral Take 2.5 mg by mouth 2 (two) times daily.      Marland Kitchen SPIRONOLACTONE 25 MG PO TABS Oral Take 25 mg by mouth daily.      . SUCRALFATE 1 G PO TABS Oral Take 1 g by mouth 4 (four) times daily.     Marland Kitchen TICAGRELOR 90 MG PO TABS Oral Take 90 mg by mouth 2 (two) times daily.        BP 128/75  Pulse 79  Temp 97.5 F (36.4 C) (Oral)  Resp 16  SpO2 100%  Physical Exam  Nursing note and vitals reviewed. Constitutional: He is oriented to person, place, and time. Vital signs are normal. He is cooperative. He has a sickly appearance.       Very thin, frail, edentulous elderly man in NAD.  Appears uncomfortable.  HENT:  Head: Normocephalic and atraumatic.  Eyes: Conjunctivae normal and EOM are normal. Pupils are equal, round, and reactive to light. No scleral icterus.       +arcus senilis  Neck: Normal range of motion. Neck supple. No JVD present. No thyromegaly present.         TTP of suboccipital and paraspinals esp with gentle pressure to scalenes. No bruising swelling or deformities. No bony tenderness. ROM limited by pain  Cardiovascular: Normal rate, regular rhythm and normal heart sounds.   Pulmonary/Chest: Effort normal and breath sounds normal. No respiratory distress. He has no wheezes. He exhibits no tenderness.  Abdominal: Soft. Bowel sounds are normal. He exhibits no distension and no mass. There is no tenderness. There is no guarding.  Musculoskeletal:       Right shoulder: He exhibits pain. He exhibits normal range of motion, no tenderness, no bony tenderness, no swelling, no effusion, no crepitus, no deformity, no laceration, normal pulse and normal strength.       Patient with markedly decreased ROM consistent with frozen shoulder.  Marked pain in neck with abduction of shoulder.    Neurological: He is alert and oriented to person, place, and time.  Skin: Skin is warm and dry.  Psychiatric: His behavior is normal.    ED Course  Procedures (including critical care time)  Labs Reviewed  GLUCOSE, CAPILLARY - Abnormal; Notable for the following:    Glucose-Capillary 188 (*)     All other components within normal limits   Dg Cervical Spine Complete  06/24/2012  *RADIOLOGY REPORT*  Clinical Data: Neck and shoulder pain.  CERVICAL SPINE - COMPLETE 4+ VIEW  Comparison: None.  Findings: AP, lateral, obliques and odontoid view of the cervical spine were obtained.  There is severe  disc space narrowing involving the cervical spine.  Mild anterolisthesis at C2-C3 is likely degenerative.  Normal appearance of the prevertebral tissues.  The patient appears to be completely edentulous.  Bone detail at the cervicothoracic junction is limited but the overall alignment is grossly normal.  There appears to be bilateral cervical foraminal narrowing.  The patient has a cardiac ICD.  IMPRESSION: Multilevel extensive cervical spondylosis.  Limited bone detail at the cervicothoracic junction as described.   Original Report Authenticated By: Richarda Overlie, M.D.    Dg Shoulder Right  06/24/2012  *RADIOLOGY REPORT*  Clinical Data: Shoulder pain  RIGHT SHOULDER - 2+ VIEW  Comparison: Chest x-ray 06/21/2012  Findings: Three views of the right shoulder submitted. No acute fracture or subluxation.  Significant degenerative changes are noted right shoulder joint.  Degenerative changes are noted right AC joint.  There is high-riding humeral head with significant narrowing of subacromial space.  Spurring of the humeral head. Findings are highly suspicious for rotator cuff insufficiency.  IMPRESSION: No acute fracture or subluxation.  Significant degenerative changes are noted right shoulder joint.  Degenerative changes are noted right AC joint.  There is high-riding humeral head with significant narrowing of  subacromial space.  Spurring of the humeral head. Findings are highly suspicious for rotator cuff insufficiency.   Original Report Authenticated By: Natasha Mead, M.D.     Date: 06/24/2012  Rate: 73  Rhythm: normal sinus rhythm  QRS Axis: normal  Intervals: normal  ST/T Wave abnormalities: normal  Conduction Disutrbances: 1st degree AV block  Narrative Interpretation: abnormal ECG  Old EKG Reviewed: No significant changes noted     1. Neck pain on right side   2. Shoulder pain, right       MDM  Obtaining xrays of neck.  Pain is likely muscular and associated with markedly poor posture and arthritis. Awaiting xrays at this time.   5:44 PM BP 128/75  Pulse 79  Temp 97.5 F (36.4 C) (Oral)  Resp 16  SpO2 100% Stable vitals.  Patient with sig. Bony degenerative changes. As before I suspect that the patient's pain is muscular and associated with his degenerative changes.  His pain is not controlled with narcotics and and ROM is severly limited in right shoulder. I am going to d/c patient with ortho f/u.   6:44 PM Patient was scheduled for discharge. The nurse noticed that during discharge instructions. Patient seemed very somnolent. I reevaluated the patient. He, said he's feeling much weaker. He denies confusion. He is alert and oriented x3. He's mucous membranes appear very dry. He is a diabetic. Repeat CBG is 167. We are ordering EKG, troponin, UA, CBC, and BMP.  8:19 PM Patient is still somnolent. He claims that he has not taken any of his pain medicines today or while he was here.  Patient level of alertness has sig. Decreased since arrival, but labs and ekg normal.  I have consulted with Dr. Ranae Palms who will re-evaluate the patient.  8:27 PM Patient seen and evaluated by Dr. Ranae Palms.  Patient became awake and alert when shoulder and neck reevaluated. Patient will be d/c with ortho f/u.  Arthor Captain, PA-C 06/24/12 2030  Arthor Captain, PA-C 06/24/12 2047

## 2012-06-24 NOTE — ED Notes (Signed)
Pt reports taking pain pill this am -

## 2012-06-24 NOTE — ED Notes (Signed)
MD at bedside. 

## 2012-06-24 NOTE — ED Notes (Signed)
Pt appears altered from prior assessment of pt. Pt has new symptoms of slowed response and speech. Pt is somnolent, however A/O x 3.  EDPA notified of change.

## 2012-06-24 NOTE — ED Notes (Signed)
Pt contacted Maureen Ralphs, neighbor for pick up.

## 2012-06-24 NOTE — ED Notes (Signed)
Informed PA about bigeminy PVCs on the monitor.

## 2012-07-17 ENCOUNTER — Ambulatory Visit (HOSPITAL_COMMUNITY)
Admission: RE | Admit: 2012-07-17 | Discharge: 2012-07-17 | Disposition: A | Discharge status: Home / Self Care | Payer: Medicare Other | Source: Ambulatory Visit | Attending: Cardiology | Admitting: Cardiology

## 2012-07-17 DIAGNOSIS — I739 Peripheral vascular disease, unspecified: Secondary | ICD-10-CM

## 2012-07-17 DIAGNOSIS — R209 Unspecified disturbances of skin sensation: Secondary | ICD-10-CM

## 2012-07-17 DIAGNOSIS — R238 Other skin changes: Secondary | ICD-10-CM | POA: Insufficient documentation

## 2012-07-17 NOTE — Progress Notes (Signed)
VASCULAR LAB PRELIMINARY  ARTERIAL  ABI completed:    RIGHT    LEFT    PRESSURE WAVEFORM  PRESSURE WAVEFORM  BRACHIAL 124 T BRACHIAL 135 T  DP 99 DM DP 122 M  AT   AT    PT 89 Severely DM PT 86 DM  PER   PER    GREAT TOE  adequate GREAT TOE  adequate    RIGHT LEFT  ABI 0.73 0.9   Duplex imaging of the arteries reveals no evidence of significant stenosis bilaterally.  [name], 07/17/2012, 10:16 AM

## 2012-07-20 ENCOUNTER — Other Ambulatory Visit (HOSPITAL_COMMUNITY): Payer: Self-pay | Admitting: Urology

## 2012-07-20 DIAGNOSIS — C61 Malignant neoplasm of prostate: Secondary | ICD-10-CM

## 2012-07-23 ENCOUNTER — Ambulatory Visit: Payer: Medicare Other | Admitting: Cardiology

## 2012-07-23 ENCOUNTER — Other Ambulatory Visit: Payer: Medicare Other

## 2012-08-03 ENCOUNTER — Encounter: Payer: Self-pay | Admitting: *Deleted

## 2012-08-07 ENCOUNTER — Encounter (HOSPITAL_COMMUNITY)
Admission: RE | Admit: 2012-08-07 | Discharge: 2012-08-07 | Disposition: A | Discharge status: Home / Self Care | Payer: Medicare Other | Source: Ambulatory Visit | Attending: Urology | Admitting: Urology

## 2012-08-07 DIAGNOSIS — C61 Malignant neoplasm of prostate: Secondary | ICD-10-CM | POA: Insufficient documentation

## 2012-08-07 MED ORDER — TECHNETIUM TC 99M MEDRONATE IV KIT
25.0000 | PACK | Freq: Once | INTRAVENOUS | Status: AC | PRN
  Administered 2012-08-07: 25 via INTRAVENOUS

## 2012-08-08 ENCOUNTER — Ambulatory Visit (INDEPENDENT_AMBULATORY_CARE_PROVIDER_SITE_OTHER): Payer: Medicare Other | Admitting: *Deleted

## 2012-08-08 ENCOUNTER — Encounter: Payer: Self-pay | Admitting: Internal Medicine

## 2012-08-08 DIAGNOSIS — I472 Ventricular tachycardia: Secondary | ICD-10-CM

## 2012-08-08 DIAGNOSIS — I5022 Chronic systolic (congestive) heart failure: Secondary | ICD-10-CM

## 2012-08-08 LAB — ICD DEVICE OBSERVATION
BATTERY VOLTAGE: 3.1977 V
BRDY-0002RV: 40 {beats}/min
PACEART VT: 0
RV LEAD AMPLITUDE: 4.75 mv
TZAT-0002FASTVT: NEGATIVE
TZAT-0004SLOWVT: 6
TZAT-0004SLOWVT: 8
TZAT-0005SLOWVT: 84 pct
TZAT-0005SLOWVT: 91 pct
TZAT-0011SLOWVT: 10 ms
TZAT-0012FASTVT: 170 ms
TZAT-0012SLOWVT: 170 ms
TZAT-0012SLOWVT: 170 ms
TZAT-0013SLOWVT: 2
TZAT-0013SLOWVT: 2
TZAT-0020FASTVT: 1.5 ms
TZON-0003SLOWVT: 350 ms
TZON-0004SLOWVT: 16
TZON-0004VSLOWVT: 20
TZST-0001FASTVT: 6
TZST-0001SLOWVT: 3
TZST-0001SLOWVT: 6
TZST-0002FASTVT: NEGATIVE
TZST-0002FASTVT: NEGATIVE
TZST-0003SLOWVT: 25 J
VENTRICULAR PACING ICD: 0.59 pct
VF: 0

## 2012-08-08 NOTE — Patient Instructions (Addendum)
Return office visit 11/07/12 @ 12:00pm with the device clinic.

## 2012-08-08 NOTE — Progress Notes (Signed)
ICD check with ICM 

## 2012-11-07 ENCOUNTER — Encounter: Payer: Self-pay | Admitting: Internal Medicine

## 2012-11-07 ENCOUNTER — Ambulatory Visit (INDEPENDENT_AMBULATORY_CARE_PROVIDER_SITE_OTHER): Payer: Medicare Other | Admitting: *Deleted

## 2012-11-07 ENCOUNTER — Other Ambulatory Visit: Payer: Self-pay | Admitting: Internal Medicine

## 2012-11-07 DIAGNOSIS — I5022 Chronic systolic (congestive) heart failure: Secondary | ICD-10-CM

## 2012-11-07 DIAGNOSIS — I428 Other cardiomyopathies: Secondary | ICD-10-CM

## 2012-11-07 LAB — ICD DEVICE OBSERVATION
BATTERY VOLTAGE: 3.1773 V
BRDY-0002RV: 40 {beats}/min
FVT: 0
PACEART VT: 0
TOT-0006: 20130206000000
TZAT-0001SLOWVT: 1
TZAT-0001SLOWVT: 2
TZAT-0005SLOWVT: 84 pct
TZAT-0011SLOWVT: 10 ms
TZAT-0011SLOWVT: 10 ms
TZAT-0012FASTVT: 170 ms
TZAT-0018FASTVT: NEGATIVE
TZAT-0018SLOWVT: NEGATIVE
TZAT-0019FASTVT: 8 V
TZAT-0019SLOWVT: 8 V
TZAT-0019SLOWVT: 8 V
TZAT-0020FASTVT: 1.5 ms
TZON-0003VSLOWVT: 450 ms
TZON-0004VSLOWVT: 20
TZST-0001FASTVT: 5
TZST-0001SLOWVT: 3
TZST-0001SLOWVT: 4
TZST-0001SLOWVT: 5
TZST-0002FASTVT: NEGATIVE
TZST-0002FASTVT: NEGATIVE
TZST-0002FASTVT: NEGATIVE
TZST-0003SLOWVT: 15 J
TZST-0003SLOWVT: 35 J
VENTRICULAR PACING ICD: 0.76 pct
VF: 0

## 2012-11-07 NOTE — Progress Notes (Signed)
ICD check with ICM 

## 2013-01-16 ENCOUNTER — Emergency Department (HOSPITAL_COMMUNITY)
Admission: EM | Admit: 2013-01-16 | Discharge: 2013-01-16 | Disposition: A | Discharge status: Home / Self Care | Payer: Medicare Other | Attending: Emergency Medicine | Admitting: Emergency Medicine

## 2013-01-16 ENCOUNTER — Emergency Department (HOSPITAL_COMMUNITY): Payer: Medicare Other

## 2013-01-16 ENCOUNTER — Encounter (HOSPITAL_COMMUNITY): Payer: Self-pay | Admitting: Emergency Medicine

## 2013-01-16 DIAGNOSIS — I2510 Atherosclerotic heart disease of native coronary artery without angina pectoris: Secondary | ICD-10-CM | POA: Insufficient documentation

## 2013-01-16 DIAGNOSIS — I951 Orthostatic hypotension: Secondary | ICD-10-CM

## 2013-01-16 DIAGNOSIS — Z7982 Long term (current) use of aspirin: Secondary | ICD-10-CM | POA: Insufficient documentation

## 2013-01-16 DIAGNOSIS — Z79899 Other long term (current) drug therapy: Secondary | ICD-10-CM | POA: Insufficient documentation

## 2013-01-16 DIAGNOSIS — E785 Hyperlipidemia, unspecified: Secondary | ICD-10-CM | POA: Insufficient documentation

## 2013-01-16 DIAGNOSIS — Z791 Long term (current) use of non-steroidal anti-inflammatories (NSAID): Secondary | ICD-10-CM | POA: Insufficient documentation

## 2013-01-16 DIAGNOSIS — Z8679 Personal history of other diseases of the circulatory system: Secondary | ICD-10-CM | POA: Insufficient documentation

## 2013-01-16 DIAGNOSIS — E119 Type 2 diabetes mellitus without complications: Secondary | ICD-10-CM | POA: Insufficient documentation

## 2013-01-16 DIAGNOSIS — E860 Dehydration: Secondary | ICD-10-CM | POA: Insufficient documentation

## 2013-01-16 DIAGNOSIS — R5383 Other fatigue: Secondary | ICD-10-CM | POA: Insufficient documentation

## 2013-01-16 DIAGNOSIS — I5020 Unspecified systolic (congestive) heart failure: Secondary | ICD-10-CM | POA: Insufficient documentation

## 2013-01-16 DIAGNOSIS — M109 Gout, unspecified: Secondary | ICD-10-CM | POA: Insufficient documentation

## 2013-01-16 DIAGNOSIS — I10 Essential (primary) hypertension: Secondary | ICD-10-CM | POA: Insufficient documentation

## 2013-01-16 DIAGNOSIS — F17200 Nicotine dependence, unspecified, uncomplicated: Secondary | ICD-10-CM | POA: Insufficient documentation

## 2013-01-16 DIAGNOSIS — R5381 Other malaise: Secondary | ICD-10-CM | POA: Insufficient documentation

## 2013-01-16 DIAGNOSIS — Z95810 Presence of automatic (implantable) cardiac defibrillator: Secondary | ICD-10-CM | POA: Insufficient documentation

## 2013-01-16 DIAGNOSIS — R42 Dizziness and giddiness: Secondary | ICD-10-CM | POA: Insufficient documentation

## 2013-01-16 LAB — BASIC METABOLIC PANEL
BUN: 22 mg/dL (ref 6–23)
Chloride: 107 mEq/L (ref 96–112)
GFR calc Af Amer: 42 mL/min — ABNORMAL LOW (ref >90)
GFR calc non Af Amer: 37 mL/min — ABNORMAL LOW (ref >90)
Potassium: 4.8 mEq/L (ref 3.5–5.1)
Sodium: 139 mEq/L (ref 135–145)

## 2013-01-16 LAB — URINALYSIS, ROUTINE W REFLEX MICROSCOPIC
Bilirubin Urine: NEGATIVE
Leukocytes, UA: NEGATIVE
Nitrite: NEGATIVE
Specific Gravity, Urine: 1.014 (ref 1.005–1.030)
Urobilinogen, UA: 1 mg/dL (ref 0.0–1.0)
pH: 6.5 (ref 5.0–8.0)

## 2013-01-16 LAB — CBC
MCHC: 35.3 g/dL (ref 30.0–36.0)
Platelets: 114 10*3/uL — ABNORMAL LOW (ref 150–400)
RDW: 12.9 % (ref 11.5–15.5)
WBC: 3.6 10*3/uL — ABNORMAL LOW (ref 4.0–10.5)

## 2013-01-16 LAB — POCT I-STAT TROPONIN I

## 2013-01-16 MED ORDER — SODIUM CHLORIDE 0.9 % IV BOLUS (SEPSIS)
1000.0000 mL | Freq: Once | INTRAVENOUS | Status: AC
  Administered 2013-01-16: 1000 mL via INTRAVENOUS

## 2013-01-16 NOTE — ED Notes (Addendum)
BIB family. C/o lightheaded, dizziness, for 2 weeks. Able to ambulate at home. Speech appropriate. No stroke Hx. NO visual changes, CP, SOB. NAD NO syncope, recent falls

## 2013-01-16 NOTE — ED Provider Notes (Signed)
History     CSN: [id number]  Arrival date & time 01/16/13  1359   First MD Initiated Contact with Patient 01/16/13 1606      Chief Complaint  Patient presents with  . Dizziness    (Consider location/radiation/quality/duration/timing/severity/associated sxs/prior treatment) The history is provided by the patient.  [name] is a 79 y.o. male hx of CAD, CHF here presenting with lightheadedness and dizziness. Feeling lightheaded for the last 2 weeks. He felt like he couldn't pass out but never passed out. His friend came and visited him and noted that he did not look well and told him to come to the ER. Denies any chest pain or shortness of breath. He does have a nonproductive cough. No fevers or chills or urinary symptoms or abdominal pain. Does have a history of CAD and diabetes.   Past Medical History  Diagnosis Date  . CAD (coronary artery disease)     s/p CABG; s/p Pacemaker  . Systolic CHF with reduced left ventricular function, NYHA class 2   . Ischemic cardiomyopathy     s/p ICD  . Hypertension   . Hyperlipidemia   . Pacemaker   . Diabetes mellitus   . Gout     Past Surgical History  Procedure Laterality Date  . Coronary artery bypass graft    . Cardiac defibrillator placement      generator change 10/12/11  . Bowel resection    . Coronary angioplasty with stent placement      History reviewed. No pertinent family history.  History  Substance Use Topics  . Smoking status: Current Some Day Smoker    Types: Pipe, Cigars  . Smokeless tobacco: Not on file     Comment: every "now and then"  . Alcohol Use: No     Comment: occasional      Review of Systems  Neurological: Positive for dizziness and weakness.  All other systems reviewed and are negative.    Allergies  Review of patient's allergies indicates no known allergies.  Home Medications   Current Outpatient Rx  Name  Route  Sig  Dispense  Refill  . allopurinol (ZYLOPRIM) 300 MG tablet  Oral   Take 300 mg by mouth daily.           Marland Kitchen aspirin 81 MG tablet   Oral   Take 81 mg by mouth daily.           Marland Kitchen atorvastatin (LIPITOR) 20 MG tablet   Oral   Take 20 mg by mouth daily.           . carvedilol (COREG) 6.25 MG tablet   Oral   Take 3.125 mg by mouth 2 (two) times daily with a meal.          . celecoxib (CELEBREX) 200 MG capsule   Oral   Take 200 mg by mouth daily.         . famotidine (PEPCID) 20 MG tablet   Oral   Take 20 mg by mouth 2 (two) times daily.           . meloxicam (MOBIC) 15 MG tablet   Oral   Take 1 tablet (15 mg total) by mouth daily.   10 tablet   0   . ramipril (ALTACE) 2.5 MG capsule   Oral   Take 2.5 mg by mouth 2 (two) times daily.           Marland Kitchen spironolactone (ALDACTONE) 25 MG tablet  Oral   Take 25 mg by mouth daily.           . sucralfate (CARAFATE) 1 G tablet   Oral   Take 1 g by mouth 4 (four) times daily.          . Ticagrelor (BRILINTA) 90 MG TABS tablet   Oral   Take 90 mg by mouth 2 (two) times daily.           . diazepam (VALIUM) 5 MG tablet   Oral   Take 1 tablet (5 mg total) by mouth every 8 (eight) hours as needed (spasms).   10 tablet   0   . oxyCODONE-acetaminophen (PERCOCET/ROXICET) 5-325 MG per tablet   Oral   Take 1-2 tablets by mouth every 6 (six) hours as needed for pain.   15 tablet   0     BP 141/64  Pulse 66  Temp(Src) 97.4 F (36.3 C)  Resp 17  SpO2 99%  Physical Exam  Nursing note and vitals reviewed. Constitutional: He is oriented to person, place, and time. He appears well-developed and well-nourished.  HENT:  Head: Normocephalic.  Mouth/Throat: Oropharynx is clear and moist.  Eyes: Pupils are equal, round, and reactive to light.  Conjunctiva slightly pale   Neck: Normal range of motion. Neck supple.  Cardiovascular: Normal rate, regular rhythm and normal heart sounds.   Pulmonary/Chest: Effort normal and breath sounds normal. No respiratory distress. He has no  wheezes. He has no rales.  Abdominal: Soft. Bowel sounds are normal. He exhibits no distension. There is no tenderness. There is no rebound and no guarding.  Musculoskeletal: Normal range of motion.  Neurological: He is alert and oriented to person, place, and time. No cranial nerve deficit. Coordination normal.  Nl strength and sensation throughout   Skin: Skin is warm and dry.  Psychiatric: He has a normal mood and affect. His behavior is normal. Judgment and thought content normal.    ED Course  Procedures (including critical care time)  Labs Reviewed  CBC - Abnormal; Notable for the following:    WBC 3.6 (*)    RBC 3.36 (*)    Hemoglobin 11.0 (*)    HCT 31.2 (*)    Platelets 114 (*)    All other components within normal limits  BASIC METABOLIC PANEL - Abnormal; Notable for the following:    Glucose, Bld 112 (*)    Creatinine, Ser 1.65 (*)    GFR calc non Af Amer 37 (*)    GFR calc Af Amer 42 (*)    All other components within normal limits  URINALYSIS, ROUTINE W REFLEX MICROSCOPIC  POCT I-STAT TROPONIN I   Dg Chest 2 View  01/16/2013   *RADIOLOGY REPORT*  Clinical Data: Cough for 2 weeks.  CHEST - 2 VIEW  Comparison: PA and lateral chest 06/21/2012 and 03/24/2011.  Findings: There is mild, chronic blunting of the costophrenic angles compatible with tiny effusions or scarring.  Lungs are clear.  There is cardiomegaly.  The patient is status post CABG with an AICD in place.  No pneumothorax identified.  Marked degenerative change about the shoulders is noted.  IMPRESSION: Cardiomegaly without acute disease.  Stable compared to prior exams.   Original Report Authenticated By: Holley Dexter, M.D.   Ct Head Wo Contrast  01/16/2013   *RADIOLOGY REPORT*  Clinical Data: Lightheadedness and dizziness with 2 weeks.  CT HEAD WITHOUT CONTRAST  Technique:  Contiguous axial images were obtained from the base of  the skull through the vertex without contrast.  Comparison: None.  Findings: No  intracranial hemorrhage.  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  Remote small right cerebellar infarct.  Small vessel disease type changes.  No CT evidence of large acute infarct.  Global atrophy without hydrocephalus.  No intracranial mass lesion detected on this unenhanced exam.  Vascular calcifications.  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  Remote small right cerebellar infarct.  Small vessel disease type changes.  No CT evidence of large acute infarct.   Original Report Authenticated By: Lacy Duverney, M.D.     No diagnosis found.   Date: 01/16/2013- 1  Rate: 57  Rhythm: sinus bradycardia  QRS Axis: normal  Intervals: PR prolonged  ST/T Wave abnormalities: nonspecific ST changes  Conduction Disutrbances:first-degree A-V block   Narrative Interpretation:   Old EKG Reviewed: none available   Date: 01/16/2013- 2  Rate: 54  Rhythm: sinus bradycardia  QRS Axis: normal  Intervals: PR prolonged  ST/T Wave abnormalities: nonspecific ST changes  Conduction Disutrbances:first-degree A-V block   Narrative Interpretation:   Old EKG Reviewed: unchanged     MDM  [name] is a 79 y.o. male here with lightheadedness. Will need to r/o orthostasis vs infections. Will get labs, orthostatic, CXR, UA. Will hydrate patient.   6:29 PM Patient orthostatic initially. Labs showed Cr. 1.6, slightly elevated compared to baseline. I think he may be a little dehydrated. After 1L bolus, not orthostatic. Felt better. UA and CXR nl. Symptoms likely from dehydration.          Richardean Canal, MD 01/16/13 [phone]

## 2013-02-12 ENCOUNTER — Encounter: Payer: Self-pay | Admitting: Cardiology

## 2013-02-12 ENCOUNTER — Telehealth: Payer: Self-pay | Admitting: *Deleted

## 2013-02-12 ENCOUNTER — Ambulatory Visit (INDEPENDENT_AMBULATORY_CARE_PROVIDER_SITE_OTHER): Payer: Medicare Other | Admitting: Cardiology

## 2013-02-12 VITALS — BP 180/70 | HR 60 | Ht 64.0 in | Wt 142.4 lb

## 2013-02-12 DIAGNOSIS — I5022 Chronic systolic (congestive) heart failure: Secondary | ICD-10-CM

## 2013-02-12 DIAGNOSIS — I472 Ventricular tachycardia: Secondary | ICD-10-CM

## 2013-02-12 DIAGNOSIS — I255 Ischemic cardiomyopathy: Secondary | ICD-10-CM

## 2013-02-12 DIAGNOSIS — Z95810 Presence of automatic (implantable) cardiac defibrillator: Secondary | ICD-10-CM

## 2013-02-12 DIAGNOSIS — I2589 Other forms of chronic ischemic heart disease: Secondary | ICD-10-CM

## 2013-02-12 LAB — ICD DEVICE OBSERVATION
BATTERY VOLTAGE: 3.18 V
BRDY-0002RV: 40 {beats}/min
FVT: 0
TZAT-0001SLOWVT: 2
TZAT-0005SLOWVT: 84 pct
TZAT-0005SLOWVT: 91 pct
TZAT-0012FASTVT: 170 ms
TZAT-0013SLOWVT: 2
TZAT-0013SLOWVT: 2
TZAT-0018FASTVT: NEGATIVE
TZAT-0018SLOWVT: NEGATIVE
TZAT-0018SLOWVT: NEGATIVE
TZAT-0019SLOWVT: 8 V
TZAT-0019SLOWVT: 8 V
TZAT-0020FASTVT: 1.5 ms
TZAT-0020SLOWVT: 1.5 ms
TZAT-0020SLOWVT: 1.5 ms
TZON-0003SLOWVT: 350 ms
TZON-0004VSLOWVT: 20
TZST-0001FASTVT: 2
TZST-0001FASTVT: 6
TZST-0001SLOWVT: 3
TZST-0001SLOWVT: 6
TZST-0002FASTVT: NEGATIVE
TZST-0002FASTVT: NEGATIVE
TZST-0003SLOWVT: 25 J
TZST-0003SLOWVT: 35 J
VENTRICULAR PACING ICD: 0.5 pct
VF: 0

## 2013-02-12 NOTE — Progress Notes (Signed)
ELECTROPHYSIOLOGY OFFICE NOTE  Patient ID: [name] MRN: [id number], DOB/AGE: 09-23-1926   Date of Visit: 02/12/2013  Primary Physician: Pola Corn, MD Primary Cardiologist / EP: Sharyn Lull, MD / Ladona Ridgel, MD Reason for Visit: EP/device follow-up  History of Present Illness  [name] is a pleasant 79 year old man with an ischemic CM s/p ICD implant, chronic systolic HF and paroxysmal VT who presents today for routine electrophysiology followup. Since last being seen in our clinic, he reports he is doing well. He has no complaints. Today, he denies chest pain or shortness of breath. He denies palpitations, dizziness, near syncope or syncope. He denies LE swelling, orthopnea, PND or recent weight gain. [name] states that he is compliant and tolerating medications without difficulty. He is also followed by Dr. Sharyn Lull.  Past Medical History Past Medical History  Diagnosis Date  . CAD (coronary artery disease)     s/p CABG; s/p Pacemaker  . Systolic CHF with reduced left ventricular function, NYHA class 2   . Ischemic cardiomyopathy     s/p ICD  . Hypertension   . Hyperlipidemia   . Pacemaker   . Diabetes mellitus   . Gout     Past Surgical History Past Surgical History  Procedure Laterality Date  . Coronary artery bypass graft    . Cardiac defibrillator placement      generator change 10/12/11  . Bowel resection    . Coronary angioplasty with stent placement      Allergies/Intolerances No Known Allergies  Current Home Medications Current Outpatient Prescriptions  Medication Sig Dispense Refill  . allopurinol (ZYLOPRIM) 300 MG tablet Take 300 mg by mouth daily.        Marland Kitchen aspirin 81 MG tablet Take 81 mg by mouth daily.        Marland Kitchen atorvastatin (LIPITOR) 20 MG tablet Take 20 mg by mouth daily.        . carvedilol (COREG) 6.25 MG tablet Take 3.125 mg by mouth 2 (two) times daily with a meal.       . famotidine (PEPCID) 20 MG tablet Take 20 mg by mouth 2 (two)  times daily.        Marland Kitchen NITROSTAT 0.4 MG SL tablet Place 0.4 mg under the tongue every 5 (five) minutes as needed.       . ramipril (ALTACE) 2.5 MG capsule Take 2.5 mg by mouth 2 (two) times daily.        Marland Kitchen spironolactone (ALDACTONE) 25 MG tablet Take 25 mg by mouth daily.        . sucralfate (CARAFATE) 1 G tablet Take 1 g by mouth 4 (four) times daily.       . Ticagrelor (BRILINTA) 90 MG TABS tablet Take 90 mg by mouth 2 (two) times daily.         No current facility-administered medications for this visit.   Social History Social History  . Marital Status: Divorced   Social History Main Topics  . Smoking status: Current Some Day Smoker    Types: Pipe, Cigars  . Smokeless tobacco: No     Comment: every "now and then"  . Alcohol Use: No     Comment: occasional  . Drug Use: No   Review of Systems General: No chills, fever, night sweats or weight changes Cardiovascular: No chest pain, dyspnea on exertion, edema, orthopnea, palpitations, paroxysmal nocturnal dyspnea Dermatological: No rash, lesions or masses Respiratory: No cough, dyspnea Urologic: No hematuria, dysuria Abdominal: No  nausea, vomiting, diarrhea, bright red blood per rectum, melena, or hematemesis Neurologic: No visual changes, weakness, changes in mental status All other systems reviewed and are otherwise negative except as noted above.  Physical Exam Blood pressure 180/70, pulse 60, height 5\' 4"  (1.626 m), weight 142 lb 6.4 oz (64.592 kg).  General: Well developed, well appearing 79 year old male in no acute distress. HEENT: Normocephalic, atraumatic. EOMs intact. Sclera nonicteric. Oropharynx clear.  Neck: Supple without bruits. No JVD. Lungs: Respirations regular and unlabored, CTA bilaterally. No wheezes, rales or rhonchi. Heart: RRR. S1, S2 present. No murmurs, rub, S3 or S4. Abdomen: Soft, non-distended.  Extremities: No clubbing, cyanosis or edema. PT/Radials 2+ and equal bilaterally. Psych: Normal  affect. Neuro: Alert and oriented X 3. Moves all extremities spontaneously.   Diagnostics Device interrogation today - Normal device function. Thresholds and sensing consistent with previous device measurements. Impedance trends stable over time. No evidence of any ventricular arrhythmias. Histogram distribution appropriate for patient and level of activity. No changes made this session. Device programmed at appropriate safety margins. Device programmed to optimize intrinsic conduction. Pt enrolled in remote follow-up. Plan to check device every 3 months remotely and in office annually.   Assessment and Plan 1. Ischemic CM s/p ICD implant Normal device function No programming changes made Continue routine ICD follow-up in device clinic every 6 months Return for follow-up with Dr. Ladona Ridgel in one year 2. Chronic systolic HF Stable; euvolemic by exam and denies worsening HF symptoms Continue medical therapy 3. Paroxysmal VT Stable; no ventricular arrhythmias on device interrogation today Continue BB 4. HTN BP elevated today and has taken his AM meds Consider up-titrating his carvedilol and/or ACEI  [name] will return for BP follow-up on Thursday or Friday at our office or with PCP  Signed, Ben Sanz, PA-C 02/12/2013, 1:52 PM

## 2013-02-12 NOTE — Telephone Encounter (Signed)
lmovm for pt to come back [name] stated pt needed a nurses visit for a bp check by Friday since BP was 180/70 when pt left today

## 2013-02-12 NOTE — Patient Instructions (Addendum)
Your physician recommends that you  keep  follow-up appointment for a remote phone check from home on September 15th, 2014   Your physician recommends that you continue on your current medications as directed. Please refer to the Current Medication list given to you today. I spoke with pharmacy today and corrected your medicine list.

## 2013-02-13 ENCOUNTER — Encounter: Payer: Self-pay | Admitting: Cardiology

## 2013-02-15 ENCOUNTER — Ambulatory Visit (INDEPENDENT_AMBULATORY_CARE_PROVIDER_SITE_OTHER): Payer: Medicare Other | Admitting: *Deleted

## 2013-02-15 VITALS — BP 124/72 | HR 64 | Ht 63.0 in | Wt 140.8 lb

## 2013-02-15 DIAGNOSIS — Z951 Presence of aortocoronary bypass graft: Secondary | ICD-10-CM

## 2013-02-15 DIAGNOSIS — I5022 Chronic systolic (congestive) heart failure: Secondary | ICD-10-CM

## 2013-02-15 DIAGNOSIS — Z95810 Presence of automatic (implantable) cardiac defibrillator: Secondary | ICD-10-CM

## 2013-02-15 DIAGNOSIS — I2510 Atherosclerotic heart disease of native coronary artery without angina pectoris: Secondary | ICD-10-CM

## 2013-02-15 NOTE — Progress Notes (Signed)
Pt comes into office today for a follow up BP check as requested by Rick Duff, PA-C.  He reports not feeling all that great but he is "OK."  He states he has not taken his medications today because he has not eaten but will take them soon.  VS as charted

## 2013-02-15 NOTE — Patient Instructions (Addendum)
Continue medications as listed.  You will be contacted if any changes are needed

## 2013-02-18 ENCOUNTER — Telehealth: Payer: Self-pay | Admitting: *Deleted

## 2013-02-18 NOTE — Telephone Encounter (Signed)
Pt was called to set up a nurses visit for a bp check, scheduling made an appt for 02/15/13

## 2013-02-19 DIAGNOSIS — I509 Heart failure, unspecified: Secondary | ICD-10-CM | POA: Diagnosis not present

## 2013-02-22 DIAGNOSIS — I509 Heart failure, unspecified: Secondary | ICD-10-CM | POA: Diagnosis not present

## 2013-02-26 DIAGNOSIS — I509 Heart failure, unspecified: Secondary | ICD-10-CM | POA: Diagnosis not present

## 2013-03-01 DIAGNOSIS — I509 Heart failure, unspecified: Secondary | ICD-10-CM | POA: Diagnosis not present

## 2013-03-05 DIAGNOSIS — I509 Heart failure, unspecified: Secondary | ICD-10-CM | POA: Diagnosis not present

## 2013-03-20 ENCOUNTER — Encounter: Payer: Self-pay | Admitting: Internal Medicine

## 2013-04-05 DIAGNOSIS — I509 Heart failure, unspecified: Secondary | ICD-10-CM | POA: Diagnosis not present

## 2013-05-06 DIAGNOSIS — I509 Heart failure, unspecified: Secondary | ICD-10-CM | POA: Diagnosis not present

## 2013-05-10 DIAGNOSIS — I509 Heart failure, unspecified: Secondary | ICD-10-CM | POA: Diagnosis not present

## 2013-05-13 DIAGNOSIS — I509 Heart failure, unspecified: Secondary | ICD-10-CM | POA: Diagnosis not present

## 2013-05-16 DIAGNOSIS — I509 Heart failure, unspecified: Secondary | ICD-10-CM | POA: Diagnosis not present

## 2013-05-17 DIAGNOSIS — I509 Heart failure, unspecified: Secondary | ICD-10-CM | POA: Diagnosis not present

## 2013-05-20 ENCOUNTER — Encounter: Payer: Medicare Other | Admitting: *Deleted

## 2013-05-22 DIAGNOSIS — I509 Heart failure, unspecified: Secondary | ICD-10-CM | POA: Diagnosis not present

## 2013-05-24 DIAGNOSIS — I509 Heart failure, unspecified: Secondary | ICD-10-CM | POA: Diagnosis not present

## 2013-05-27 ENCOUNTER — Encounter: Payer: Self-pay | Admitting: *Deleted

## 2013-05-28 DIAGNOSIS — I509 Heart failure, unspecified: Secondary | ICD-10-CM | POA: Diagnosis not present

## 2013-05-31 DIAGNOSIS — I509 Heart failure, unspecified: Secondary | ICD-10-CM | POA: Diagnosis not present

## 2013-06-03 DIAGNOSIS — I509 Heart failure, unspecified: Secondary | ICD-10-CM | POA: Diagnosis not present

## 2013-06-05 DIAGNOSIS — I509 Heart failure, unspecified: Secondary | ICD-10-CM | POA: Diagnosis not present

## 2013-06-12 ENCOUNTER — Ambulatory Visit (INDEPENDENT_AMBULATORY_CARE_PROVIDER_SITE_OTHER): Admitting: *Deleted

## 2013-06-12 DIAGNOSIS — I5022 Chronic systolic (congestive) heart failure: Secondary | ICD-10-CM

## 2013-06-12 DIAGNOSIS — I428 Other cardiomyopathies: Secondary | ICD-10-CM

## 2013-06-12 LAB — ICD DEVICE OBSERVATION
BRDY-0002RV: 40 {beats}/min
CHARGE TIME: 9 s
DEV-0020ICD: NEGATIVE
RV LEAD AMPLITUDE: 5 mv
RV LEAD IMPEDENCE ICD: 456 Ohm
TZAT-0001FASTVT: 1
TZAT-0001SLOWVT: 1
TZAT-0002FASTVT: NEGATIVE
TZAT-0011SLOWVT: 10 ms
TZAT-0011SLOWVT: 10 ms
TZAT-0012FASTVT: 170 ms
TZAT-0018SLOWVT: NEGATIVE
TZAT-0018SLOWVT: NEGATIVE
TZAT-0019SLOWVT: 8 V
TZAT-0019SLOWVT: 8 V
TZAT-0020SLOWVT: 1.5 ms
TZON-0005SLOWVT: 12
TZST-0001FASTVT: 3
TZST-0001FASTVT: 4
TZST-0001FASTVT: 5
TZST-0001SLOWVT: 4
TZST-0002FASTVT: NEGATIVE
TZST-0002FASTVT: NEGATIVE
TZST-0003SLOWVT: 35 J
TZST-0003SLOWVT: 35 J
VENTRICULAR PACING ICD: 2.2 pct

## 2013-06-12 NOTE — Progress Notes (Signed)
In office ICD interrogation. Normal function. No changes made this session. 

## 2013-06-17 DIAGNOSIS — Z1331 Encounter for screening for depression: Secondary | ICD-10-CM | POA: Diagnosis not present

## 2013-06-17 DIAGNOSIS — E875 Hyperkalemia: Secondary | ICD-10-CM | POA: Diagnosis not present

## 2013-06-17 DIAGNOSIS — M109 Gout, unspecified: Secondary | ICD-10-CM | POA: Diagnosis not present

## 2013-06-17 DIAGNOSIS — I509 Heart failure, unspecified: Secondary | ICD-10-CM | POA: Diagnosis not present

## 2013-06-17 DIAGNOSIS — Z23 Encounter for immunization: Secondary | ICD-10-CM | POA: Diagnosis not present

## 2013-06-17 DIAGNOSIS — I2510 Atherosclerotic heart disease of native coronary artery without angina pectoris: Secondary | ICD-10-CM | POA: Diagnosis not present

## 2013-06-17 DIAGNOSIS — H6120 Impacted cerumen, unspecified ear: Secondary | ICD-10-CM | POA: Diagnosis not present

## 2013-06-17 DIAGNOSIS — Z95810 Presence of automatic (implantable) cardiac defibrillator: Secondary | ICD-10-CM | POA: Diagnosis not present

## 2013-06-26 ENCOUNTER — Encounter: Payer: Self-pay | Admitting: Internal Medicine

## 2013-07-03 DIAGNOSIS — C61 Malignant neoplasm of prostate: Secondary | ICD-10-CM | POA: Diagnosis not present

## 2013-07-06 DIAGNOSIS — I509 Heart failure, unspecified: Secondary | ICD-10-CM | POA: Diagnosis not present

## 2013-08-05 DIAGNOSIS — I509 Heart failure, unspecified: Secondary | ICD-10-CM | POA: Diagnosis not present

## 2013-09-05 DIAGNOSIS — I509 Heart failure, unspecified: Secondary | ICD-10-CM | POA: Diagnosis not present

## 2013-09-12 ENCOUNTER — Ambulatory Visit (INDEPENDENT_AMBULATORY_CARE_PROVIDER_SITE_OTHER): Admitting: *Deleted

## 2013-09-12 DIAGNOSIS — I5022 Chronic systolic (congestive) heart failure: Secondary | ICD-10-CM

## 2013-09-12 DIAGNOSIS — Z95810 Presence of automatic (implantable) cardiac defibrillator: Secondary | ICD-10-CM

## 2013-09-12 LAB — MDC_IDC_ENUM_SESS_TYPE_INCLINIC
Battery Voltage: 3.16 V
Brady Statistic RV Percent Paced: 1.07 %
HIGH POWER IMPEDANCE MEASURED VALUE: 228 Ohm
HIGH POWER IMPEDANCE MEASURED VALUE: 342 Ohm
HighPow Impedance: 37 Ohm
HighPow Impedance: 47 Ohm
Lead Channel Impedance Value: 456 Ohm
Lead Channel Pacing Threshold Pulse Width: 0.4 ms
Lead Channel Setting Pacing Amplitude: 2.5 V
Lead Channel Setting Pacing Pulse Width: 0.4 ms
MDC IDC MSMT LEADCHNL RV PACING THRESHOLD AMPLITUDE: 0.75 V
MDC IDC MSMT LEADCHNL RV SENSING INTR AMPL: 4.75 mV
MDC IDC MSMT LEADCHNL RV SENSING INTR AMPL: 5.75 mV
MDC IDC SESS DTM: 20150108141252
MDC IDC SET LEADCHNL RV SENSING SENSITIVITY: 0.3 mV
MDC IDC SET ZONE DETECTION INTERVAL: 300 ms
MDC IDC SET ZONE DETECTION INTERVAL: 350 ms
Zone Setting Detection Interval: 450 ms

## 2013-09-12 NOTE — Progress Notes (Signed)
ICD check in clinic. Normal device function. Thresholds and sensing consistent with previous device measurements. Impedance trends stable over time. 1 NSVT--- 7 beats. Histogram distribution appropriate for patient and level of activity. No changes made this session. Device programmed at appropriate safety margins. Device programmed to optimize intrinsic conduction. Remaining battery 3.16V. Alert tones demonstrated for patient, pt knows to call us if heard.   ROV w/ Dr. [name] 12/12/13.

## 2013-10-06 DIAGNOSIS — I509 Heart failure, unspecified: Secondary | ICD-10-CM | POA: Diagnosis not present

## 2013-10-09 DIAGNOSIS — I509 Heart failure, unspecified: Secondary | ICD-10-CM | POA: Diagnosis not present

## 2013-10-11 DIAGNOSIS — I509 Heart failure, unspecified: Secondary | ICD-10-CM | POA: Diagnosis not present

## 2013-10-17 DIAGNOSIS — I509 Heart failure, unspecified: Secondary | ICD-10-CM | POA: Diagnosis not present

## 2013-10-18 DIAGNOSIS — I509 Heart failure, unspecified: Secondary | ICD-10-CM | POA: Diagnosis not present

## 2013-10-20 DIAGNOSIS — I509 Heart failure, unspecified: Secondary | ICD-10-CM | POA: Diagnosis not present

## 2013-10-23 DIAGNOSIS — I509 Heart failure, unspecified: Secondary | ICD-10-CM | POA: Diagnosis not present

## 2013-10-25 ENCOUNTER — Encounter: Payer: Self-pay | Admitting: Internal Medicine

## 2013-10-25 DIAGNOSIS — I509 Heart failure, unspecified: Secondary | ICD-10-CM | POA: Diagnosis not present

## 2013-11-01 DIAGNOSIS — I509 Heart failure, unspecified: Secondary | ICD-10-CM | POA: Diagnosis not present

## 2013-11-03 DIAGNOSIS — I509 Heart failure, unspecified: Secondary | ICD-10-CM | POA: Diagnosis not present

## 2013-11-11 ENCOUNTER — Telehealth: Payer: Self-pay | Admitting: Internal Medicine

## 2013-11-11 NOTE — Telephone Encounter (Signed)
Walk In Pt Form " EMT Information" Card dropped Off For Completion gave to [facility]

## 2013-11-19 ENCOUNTER — Telehealth: Payer: Self-pay | Admitting: Internal Medicine

## 2013-11-19 DIAGNOSIS — H35079 Retinal telangiectasis, unspecified eye: Secondary | ICD-10-CM | POA: Diagnosis not present

## 2013-11-19 DIAGNOSIS — H3560 Retinal hemorrhage, unspecified eye: Secondary | ICD-10-CM | POA: Diagnosis not present

## 2013-11-19 NOTE — Telephone Encounter (Signed)
Pt Aware EMT Card Ready for Pick up 3.17.15/kdm

## 2013-12-04 DIAGNOSIS — I509 Heart failure, unspecified: Secondary | ICD-10-CM | POA: Diagnosis not present

## 2013-12-05 DIAGNOSIS — Z125 Encounter for screening for malignant neoplasm of prostate: Secondary | ICD-10-CM | POA: Diagnosis not present

## 2013-12-05 DIAGNOSIS — E785 Hyperlipidemia, unspecified: Secondary | ICD-10-CM | POA: Diagnosis not present

## 2013-12-05 DIAGNOSIS — H25049 Posterior subcapsular polar age-related cataract, unspecified eye: Secondary | ICD-10-CM | POA: Diagnosis not present

## 2013-12-05 DIAGNOSIS — H34239 Retinal artery branch occlusion, unspecified eye: Secondary | ICD-10-CM | POA: Diagnosis not present

## 2013-12-05 DIAGNOSIS — Z79899 Other long term (current) drug therapy: Secondary | ICD-10-CM | POA: Diagnosis not present

## 2013-12-05 DIAGNOSIS — M109 Gout, unspecified: Secondary | ICD-10-CM | POA: Diagnosis not present

## 2013-12-06 DIAGNOSIS — I509 Heart failure, unspecified: Secondary | ICD-10-CM | POA: Diagnosis not present

## 2013-12-07 DIAGNOSIS — I509 Heart failure, unspecified: Secondary | ICD-10-CM | POA: Diagnosis not present

## 2013-12-09 DIAGNOSIS — R7309 Other abnormal glucose: Secondary | ICD-10-CM | POA: Diagnosis not present

## 2013-12-10 DIAGNOSIS — H3520 Other non-diabetic proliferative retinopathy, unspecified eye: Secondary | ICD-10-CM | POA: Diagnosis not present

## 2013-12-10 DIAGNOSIS — H34239 Retinal artery branch occlusion, unspecified eye: Secondary | ICD-10-CM | POA: Diagnosis not present

## 2013-12-12 ENCOUNTER — Ambulatory Visit (INDEPENDENT_AMBULATORY_CARE_PROVIDER_SITE_OTHER): Admitting: *Deleted

## 2013-12-12 DIAGNOSIS — I5022 Chronic systolic (congestive) heart failure: Secondary | ICD-10-CM

## 2013-12-12 DIAGNOSIS — I428 Other cardiomyopathies: Secondary | ICD-10-CM

## 2013-12-12 LAB — MDC_IDC_ENUM_SESS_TYPE_INCLINIC
Battery Voltage: 3.14 V
HIGH POWER IMPEDANCE MEASURED VALUE: 304 Ohm
HighPow Impedance: 228 Ohm
HighPow Impedance: 33 Ohm
HighPow Impedance: 43 Ohm
Lead Channel Sensing Intrinsic Amplitude: 4.375 mV
Lead Channel Setting Pacing Pulse Width: 0.4 ms
Lead Channel Setting Sensing Sensitivity: 0.3 mV
MDC IDC MSMT LEADCHNL RV IMPEDANCE VALUE: 418 Ohm
MDC IDC MSMT LEADCHNL RV PACING THRESHOLD AMPLITUDE: 0.75 V
MDC IDC MSMT LEADCHNL RV PACING THRESHOLD PULSEWIDTH: 0.4 ms
MDC IDC MSMT LEADCHNL RV SENSING INTR AMPL: 4.75 mV
MDC IDC SESS DTM: 20150409144618
MDC IDC SET LEADCHNL RV PACING AMPLITUDE: 2.5 V
MDC IDC STAT BRADY RV PERCENT PACED: 2.56 %
Zone Setting Detection Interval: 300 ms
Zone Setting Detection Interval: 350 ms
Zone Setting Detection Interval: 450 ms

## 2013-12-12 NOTE — Progress Notes (Signed)
ICD check in clinic. Normal device function. Thresholds and sensing consistent with previous device measurements. Impedance trends stable over time. No evidence of any ventricular arrhythmias.  Histogram distribution appropriate for patient and level of activity. No changes made this session. Optivol and thoracic impedance abnormal 4/4 ongoing.  [phone] lead with 5076 rate/sense.    Device programmed at appropriate safety margins. Device programmed to optimize intrinsic conduction.  Patient education completed including shock plan. Alert tones/vibration demonstrated for patient.  ROV in June with Dr. [name].

## 2013-12-13 DIAGNOSIS — K439 Ventral hernia without obstruction or gangrene: Secondary | ICD-10-CM | POA: Diagnosis not present

## 2013-12-13 DIAGNOSIS — Z0000 Encounter for general adult medical examination without abnormal findings: Secondary | ICD-10-CM | POA: Diagnosis not present

## 2013-12-13 DIAGNOSIS — E785 Hyperlipidemia, unspecified: Secondary | ICD-10-CM | POA: Diagnosis not present

## 2013-12-13 DIAGNOSIS — M109 Gout, unspecified: Secondary | ICD-10-CM | POA: Diagnosis not present

## 2013-12-13 DIAGNOSIS — Z6828 Body mass index (BMI) 28.0-28.9, adult: Secondary | ICD-10-CM | POA: Diagnosis not present

## 2013-12-13 DIAGNOSIS — I509 Heart failure, unspecified: Secondary | ICD-10-CM | POA: Diagnosis not present

## 2013-12-13 DIAGNOSIS — I2510 Atherosclerotic heart disease of native coronary artery without angina pectoris: Secondary | ICD-10-CM | POA: Diagnosis not present

## 2013-12-13 DIAGNOSIS — Z95810 Presence of automatic (implantable) cardiac defibrillator: Secondary | ICD-10-CM | POA: Diagnosis not present

## 2013-12-14 DIAGNOSIS — I509 Heart failure, unspecified: Secondary | ICD-10-CM | POA: Diagnosis not present

## 2013-12-17 DIAGNOSIS — I509 Heart failure, unspecified: Secondary | ICD-10-CM | POA: Diagnosis not present

## 2013-12-18 DIAGNOSIS — I509 Heart failure, unspecified: Secondary | ICD-10-CM | POA: Diagnosis not present

## 2013-12-20 DIAGNOSIS — I509 Heart failure, unspecified: Secondary | ICD-10-CM | POA: Diagnosis not present

## 2013-12-27 DIAGNOSIS — I509 Heart failure, unspecified: Secondary | ICD-10-CM | POA: Diagnosis not present

## 2014-01-03 DIAGNOSIS — I509 Heart failure, unspecified: Secondary | ICD-10-CM | POA: Diagnosis not present

## 2014-01-06 DIAGNOSIS — C61 Malignant neoplasm of prostate: Secondary | ICD-10-CM | POA: Diagnosis not present

## 2014-01-07 ENCOUNTER — Emergency Department (HOSPITAL_COMMUNITY)
Admission: EM | Admit: 2014-01-07 | Discharge: 2014-01-07 | Disposition: A | Discharge status: Home / Self Care | Payer: Medicare Other | Attending: Emergency Medicine | Admitting: Emergency Medicine

## 2014-01-07 ENCOUNTER — Emergency Department (HOSPITAL_COMMUNITY): Payer: Medicare Other

## 2014-01-07 ENCOUNTER — Encounter (HOSPITAL_COMMUNITY): Payer: Self-pay | Admitting: Emergency Medicine

## 2014-01-07 DIAGNOSIS — Z7902 Long term (current) use of antithrombotics/antiplatelets: Secondary | ICD-10-CM | POA: Diagnosis not present

## 2014-01-07 DIAGNOSIS — H9120 Sudden idiopathic hearing loss, unspecified ear: Secondary | ICD-10-CM | POA: Insufficient documentation

## 2014-01-07 DIAGNOSIS — E785 Hyperlipidemia, unspecified: Secondary | ICD-10-CM | POA: Insufficient documentation

## 2014-01-07 DIAGNOSIS — I2589 Other forms of chronic ischemic heart disease: Secondary | ICD-10-CM | POA: Insufficient documentation

## 2014-01-07 DIAGNOSIS — Z7982 Long term (current) use of aspirin: Secondary | ICD-10-CM | POA: Diagnosis not present

## 2014-01-07 DIAGNOSIS — H9209 Otalgia, unspecified ear: Secondary | ICD-10-CM | POA: Diagnosis not present

## 2014-01-07 DIAGNOSIS — F17200 Nicotine dependence, unspecified, uncomplicated: Secondary | ICD-10-CM | POA: Insufficient documentation

## 2014-01-07 DIAGNOSIS — E119 Type 2 diabetes mellitus without complications: Secondary | ICD-10-CM | POA: Diagnosis not present

## 2014-01-07 DIAGNOSIS — Z79899 Other long term (current) drug therapy: Secondary | ICD-10-CM | POA: Insufficient documentation

## 2014-01-07 DIAGNOSIS — K5900 Constipation, unspecified: Secondary | ICD-10-CM | POA: Diagnosis not present

## 2014-01-07 DIAGNOSIS — I10 Essential (primary) hypertension: Secondary | ICD-10-CM | POA: Diagnosis not present

## 2014-01-07 DIAGNOSIS — I509 Heart failure, unspecified: Secondary | ICD-10-CM | POA: Insufficient documentation

## 2014-01-07 DIAGNOSIS — H6120 Impacted cerumen, unspecified ear: Secondary | ICD-10-CM | POA: Insufficient documentation

## 2014-01-07 DIAGNOSIS — Z950 Presence of cardiac pacemaker: Secondary | ICD-10-CM | POA: Diagnosis not present

## 2014-01-07 DIAGNOSIS — M109 Gout, unspecified: Secondary | ICD-10-CM | POA: Insufficient documentation

## 2014-01-07 LAB — URINALYSIS, ROUTINE W REFLEX MICROSCOPIC
Bilirubin Urine: NEGATIVE
GLUCOSE, UA: NEGATIVE mg/dL
Hgb urine dipstick: NEGATIVE
KETONES UR: NEGATIVE mg/dL
LEUKOCYTES UA: NEGATIVE
Nitrite: NEGATIVE
PROTEIN: 30 mg/dL — AB
Specific Gravity, Urine: 1.019 (ref 1.005–1.030)
Urobilinogen, UA: 0.2 mg/dL (ref 0.0–1.0)
pH: 5 (ref 5.0–8.0)

## 2014-01-07 LAB — COMPREHENSIVE METABOLIC PANEL
ALBUMIN: 3.7 g/dL (ref 3.5–5.2)
ALK PHOS: 85 U/L (ref 39–117)
ALT: 17 U/L (ref 0–53)
AST: 25 U/L (ref 0–37)
BILIRUBIN TOTAL: 1.4 mg/dL — AB (ref 0.3–1.2)
BUN: 20 mg/dL (ref 6–23)
CHLORIDE: 108 meq/L (ref 96–112)
CO2: 20 meq/L (ref 19–32)
Calcium: 9.6 mg/dL (ref 8.4–10.5)
Creatinine, Ser: 1.29 mg/dL (ref 0.50–1.35)
GFR calc Af Amer: 57 mL/min — ABNORMAL LOW (ref >90)
GFR calc non Af Amer: 49 mL/min — ABNORMAL LOW (ref >90)
GLUCOSE: 116 mg/dL — AB (ref 70–99)
POTASSIUM: 4.5 meq/L (ref 3.7–5.3)
Sodium: 142 mEq/L (ref 137–147)
Total Protein: 6.6 g/dL (ref 6.0–8.3)

## 2014-01-07 LAB — CBC WITH DIFFERENTIAL/PLATELET
BASOS PCT: 0 % (ref 0–1)
Basophils Absolute: 0 10*3/uL (ref 0.0–0.1)
Eosinophils Absolute: 0.2 10*3/uL (ref 0.0–0.7)
Eosinophils Relative: 4 % (ref 0–5)
HCT: 35.5 % — ABNORMAL LOW (ref 39.0–52.0)
HEMOGLOBIN: 12.4 g/dL — AB (ref 13.0–17.0)
LYMPHS ABS: 0.9 10*3/uL (ref 0.7–4.0)
LYMPHS PCT: 22 % (ref 12–46)
MCH: 32.1 pg (ref 26.0–34.0)
MCHC: 34.9 g/dL (ref 30.0–36.0)
MCV: 92 fL (ref 78.0–100.0)
MONOS PCT: 11 % (ref 3–12)
Monocytes Absolute: 0.4 10*3/uL (ref 0.1–1.0)
NEUTROS ABS: 2.6 10*3/uL (ref 1.7–7.7)
NEUTROS PCT: 63 % (ref 43–77)
Platelets: 188 10*3/uL (ref 150–400)
RBC: 3.86 MIL/uL — AB (ref 4.22–5.81)
RDW: 14.7 % (ref 11.5–15.5)
WBC: 4 10*3/uL (ref 4.0–10.5)

## 2014-01-07 LAB — URINE MICROSCOPIC-ADD ON

## 2014-01-07 MED ORDER — ACETAMINOPHEN 325 MG PO TABS
650.0000 mg | ORAL_TABLET | Freq: Once | ORAL | Status: AC
  Administered 2014-01-07: 650 mg via ORAL
  Filled 2014-01-07: qty 2

## 2014-01-07 MED ORDER — DOCUSATE SODIUM 100 MG PO CAPS: 100.0000 mg | ORAL_CAPSULE | Freq: Two times a day (BID) | ORAL | Status: DC

## 2014-01-07 NOTE — ED Notes (Signed)
Pt reports constipation x 4 days. Also reports otalgia in both ears. Given OTC med at [facility] without relief. Does not know the name of med.

## 2014-01-07 NOTE — Discharge Instructions (Signed)
You were seen and evaluated for your constipation symptoms as well as your ear complaints. Your testing and x-rays do not show any concerning findings. Please use a stool softener and followup with your primary care provider for continued evaluation and treatment. Return for any changing or worsening symptoms.    Cerumen Impaction A cerumen impaction is when the wax in your ear forms a plug. This plug usually causes reduced hearing. Sometimes it also causes an earache or dizziness. Removing a cerumen impaction can be difficult and painful. The wax sticks to the ear canal. The canal is sensitive and bleeds easily. If you try to remove a heavy wax buildup with a cotton tipped swab, you may push it in further. Irrigation with water, suction, and small ear curettes may be used to clear out the wax. If the impaction is fixed to the skin in the ear canal, ear drops may be needed for a few days to loosen the wax. People who build up a lot of wax frequently can use ear wax removal products available in your local drugstore. SEEK MEDICAL CARE IF:  You develop an earache, increased hearing loss, or marked dizziness. Document Released: 09/29/2004 Document Revised: 11/14/2011 Document Reviewed: 11/19/2009 [facility] Patient Information 2014 [place], [place].     Constipation, Adult Constipation is when a person:  Poops (bowel movement) less than 3 times a week.  Has a hard time pooping.  Has poop that is dry, hard, or bigger than normal. HOME CARE   Eat more fiber, such as fruits, vegetables, whole grains like brown rice, and beans.  Eat less fatty foods and sugar. This includes [place] fries, hamburgers, cookies, candy, and soda.  If you are not getting enough fiber from food, take products with added fiber in them (supplements).  Drink enough fluid to keep your pee (urine) clear or pale yellow.  Go to the restroom when you feel like you need to poop. Do not hold it.  Only take medicine as told  by your doctor. Do not take medicines that help you poop (laxatives) without talking to your doctor first.  Exercise on a regular basis, or as told by your doctor. GET HELP RIGHT AWAY IF:   You have bright red blood in your poop (stool).  Your constipation lasts more than 4 days or gets worse.  You have belly (abdomen) or butt (rectal) pain.  You have thin poop (as thin as a pencil).  You lose weight, and it cannot be explained. MAKE SURE YOU:   Understand these instructions.  Will watch your condition.  Will get help right away if you are not doing well or get worse. Document Released: 02/08/2008 Document Revised: 11/14/2011 Document Reviewed: 06/03/2013 [facility] Patient Information 2014 [place], [place].

## 2014-01-07 NOTE — ED Provider Notes (Signed)
CSN: [id number]     Arrival date & time 01/07/14  1348 History   First MD Initiated Contact with Patient 01/07/14 2006     Chief Complaint  Patient presents with  . Constipation  . Otalgia   HPI  History provided by the patient. The patient is a this is 80 year old male with history of hypertension, diabetes, hyperlipidemia, CAD, CHF and history of previous bowel resection presenting with complaints of constipation and bilateral ear pain and decreased hearing. He states he has had very little bowel movement for the past 4 days. Occasionally passing small round hard stools. He reports feeling some fullness and bloating in the abdomen. Denies any significant pain. There is no associated nausea or vomiting. He also reports having some increased ear discomfort and fullness with slightly decreased and muffled hearing. Patient did try using an over-the-counter medicine without any relief. He has not used any other medicines or treatments. Denies any bleeding from the rectum. Pt does report seeing Dr. Kellie Simmering with GI yesterday but forgot to mention his constipation symptoms. No other aggravating or alleviating factors. No other associated symptoms.    Past Medical History  Diagnosis Date  . CAD (coronary artery disease)     s/p CABG; s/p Pacemaker  . Systolic CHF with reduced left ventricular function, NYHA class 2   . Ischemic cardiomyopathy     s/p ICD  . Hypertension   . Hyperlipidemia   . Pacemaker   . Diabetes mellitus   . Gout    Past Surgical History  Procedure Laterality Date  . Coronary artery bypass graft    . Cardiac defibrillator placement      generator change 10/12/11  . Bowel resection    . Coronary angioplasty with stent placement     No family history on file. History  Substance Use Topics  . Smoking status: Current Some Day Smoker    Types: Pipe, Cigars  . Smokeless tobacco: Not on file     Comment: every "now and then"  . Alcohol Use: No     Comment: occasional     Review of Systems  Constitutional: Positive for unexpected weight change. Negative for fever, chills and diaphoresis.  HENT: Positive for ear pain and hearing loss.   Respiratory: Negative for cough.   Cardiovascular: Negative for chest pain.  Gastrointestinal: Positive for constipation. Negative for nausea, vomiting, abdominal pain and blood in stool.  Genitourinary: Negative for dysuria, frequency, hematuria and flank pain.  All other systems reviewed and are negative.     Allergies  Review of patient's allergies indicates no known allergies.  Home Medications   Prior to Admission medications   Medication Sig Start Date End Date Taking? Authorizing Provider  allopurinol (ZYLOPRIM) 300 MG tablet Take 300 mg by mouth daily.     Yes Historical Provider, MD  aspirin 81 MG tablet Take 81 mg by mouth daily.     Yes Historical Provider, MD  atorvastatin (LIPITOR) 20 MG tablet Take 20 mg by mouth daily.     Yes Historical Provider, MD  carvedilol (COREG) 6.25 MG tablet Take 6.25 mg by mouth daily.    Yes Historical Provider, MD  docusate sodium (COLACE) 100 MG capsule Take 100 mg by mouth 2 (two) times daily.   Yes Historical Provider, MD  famotidine (PEPCID) 20 MG tablet Take 20 mg by mouth 2 (two) times daily.     Yes Historical Provider, MD  NITROSTAT 0.4 MG SL tablet Place 0.4 mg under the tongue  every 5 (five) minutes as needed.  12/05/12  Yes Historical Provider, MD  ramipril (ALTACE) 2.5 MG capsule Take 2.5 mg by mouth 2 (two) times daily.     Yes Historical Provider, MD  spironolactone (ALDACTONE) 25 MG tablet Take 25 mg by mouth daily.     Yes Historical Provider, MD  sucralfate (CARAFATE) 1 G tablet Take 1 g by mouth 4 (four) times daily.    Yes Historical Provider, MD  Ticagrelor (BRILINTA) 90 MG TABS tablet Take 90 mg by mouth 2 (two) times daily.     Yes Historical Provider, MD   BP 149/98  Pulse 68  Temp(Src) 97 F (36.1 C) (Oral)  Resp 20  SpO2 97% Physical Exam   Nursing note and vitals reviewed. Constitutional: He is oriented to person, place, and time. He appears well-developed and well-nourished.  HENT:  Head: Normocephalic.  Cerumen impaction bilaterally  Eyes: Conjunctivae are normal.  Arcus senilis  Cardiovascular: Normal rate and regular rhythm.   Pulmonary/Chest: Effort normal and breath sounds normal. No respiratory distress. He has no wheezes. He has no rales.  Abdominal: Soft. He exhibits no distension. There is no tenderness. There is no rebound and no guarding.  Midline abdominal surgical scar consistent with history of previous surgeries. Mild distention. No significant tenderness.  Genitourinary:  Refused rectal exam.  Musculoskeletal: Normal range of motion.  Neurological: He is alert and oriented to person, place, and time.  Skin: Skin is warm.  Psychiatric: He has a normal mood and affect. His behavior is normal.    ED Course  Procedures   COORDINATION OF CARE:  Nursing notes reviewed. Vital signs reviewed. Initial pt interview and examination performed.   Filed Vitals:   01/07/14 1404 01/07/14 1658 01/07/14 1707 01/07/14 1945  BP: 123/65  141/78 149/98  Pulse: 60 67  68  Temp: 98.4 F (36.9 C) 97 F (36.1 C)    TempSrc: Oral Oral    Resp: 16 20    SpO2: 98% 97%  97%    8:26 PM-patient seen and evaluated. Patient sitting appears comfortable no acute distress. Abdomen with very mild distention. No significant tenderness to palpation. Patient refusing rectal exam. Reports being seen at Dr. Kellie Simmering with GI yesterday.  States he forgot to mention anything about constipation.  And and also reports having a rectal exam at that office visit so he does not wish to have one today.  Patient reports feeling much better after ear is being irrigated. Continues to have soft abdomen without significant pains. No signs of SBO. Will recommend a stool softer and have patient followup with his doctors. Patient agrees with  plan.   Treatment plan initiated: Medications  acetaminophen (TYLENOL) tablet 650 mg (650 mg Oral Given 01/07/14 1720)   Results for orders placed during the hospital encounter of 01/07/14  CBC WITH DIFFERENTIAL      Result Value Ref Range   WBC 4.0  4.0 - 10.5 K/uL   RBC 3.86 (*) 4.22 - 5.81 MIL/uL   Hemoglobin 12.4 (*) 13.0 - 17.0 g/dL   HCT 35.5 (*) 39.0 - 52.0 %   MCV 92.0  78.0 - 100.0 fL   MCH 32.1  26.0 - 34.0 pg   MCHC 34.9  30.0 - 36.0 g/dL   RDW 14.7  11.5 - 15.5 %   Platelets 188  150 - 400 K/uL   Neutrophils Relative % 63  43 - 77 %   Neutro Abs 2.6  1.7 - 7.7 K/uL  Lymphocytes Relative 22  12 - 46 %   Lymphs Abs 0.9  0.7 - 4.0 K/uL   Monocytes Relative 11  3 - 12 %   Monocytes Absolute 0.4  0.1 - 1.0 K/uL   Eosinophils Relative 4  0 - 5 %   Eosinophils Absolute 0.2  0.0 - 0.7 K/uL   Basophils Relative 0  0 - 1 %   Basophils Absolute 0.0  0.0 - 0.1 K/uL  COMPREHENSIVE METABOLIC PANEL      Result Value Ref Range   Sodium 142  137 - 147 mEq/L   Potassium 4.5  3.7 - 5.3 mEq/L   Chloride 108  96 - 112 mEq/L   CO2 20  19 - 32 mEq/L   Glucose, Bld 116 (*) 70 - 99 mg/dL   BUN 20  6 - 23 mg/dL   Creatinine, Ser 1.29  0.50 - 1.35 mg/dL   Calcium 9.6  8.4 - 10.5 mg/dL   Total Protein 6.6  6.0 - 8.3 g/dL   Albumin 3.7  3.5 - 5.2 g/dL   AST 25  0 - 37 U/L   ALT 17  0 - 53 U/L   Alkaline Phosphatase 85  39 - 117 U/L   Total Bilirubin 1.4 (*) 0.3 - 1.2 mg/dL   GFR calc non Af Amer 49 (*) >90 mL/min   GFR calc Af Amer 57 (*) >90 mL/min  URINALYSIS, ROUTINE W REFLEX MICROSCOPIC      Result Value Ref Range   Color, Urine YELLOW  YELLOW   APPearance CLEAR  CLEAR   Specific Gravity, Urine 1.019  1.005 - 1.030   pH 5.0  5.0 - 8.0   Glucose, UA NEGATIVE  NEGATIVE mg/dL   Hgb urine dipstick NEGATIVE  NEGATIVE   Bilirubin Urine NEGATIVE  NEGATIVE   Ketones, ur NEGATIVE  NEGATIVE mg/dL   Protein, ur 30 (*) NEGATIVE mg/dL   Urobilinogen, UA 0.2  0.0 - 1.0 mg/dL   Nitrite  NEGATIVE  NEGATIVE   Leukocytes, UA NEGATIVE  NEGATIVE  URINE MICROSCOPIC-ADD ON      Result Value Ref Range   Squamous Epithelial / LPF RARE  RARE   Bacteria, UA RARE  RARE   Casts GRANULAR CAST (*) NEGATIVE     Imaging Review Dg Abd 1 View  01/07/2014   CLINICAL DATA:  Constipation  EXAM: ABDOMEN - 1 VIEW  COMPARISON:  None.  FINDINGS: There is nonspecific nonobstructive bowel gas pattern. Pelvic phleboliths are noted. Atherosclerotic calcifications of iliac arteries. No colonic stool is noted.  IMPRESSION: Negative.   Electronically Signed   By: Lahoma Crocker M.D.   On: 01/07/2014 21:40     MDM   Final diagnoses:  Constipation  Cerumen impaction       Martie Lee, PA-C 01/08/14 [phone]

## 2014-01-08 NOTE — ED Provider Notes (Signed)
Medical screening examination/treatment/procedure(s) were performed by non-physician practitioner and as supervising physician I was immediately available for consultation/collaboration.   EKG Interpretation None       Merryl Hacker, MD 01/08/14 [phone]

## 2014-01-10 ENCOUNTER — Encounter: Payer: Self-pay | Admitting: Internal Medicine

## 2014-01-21 ENCOUNTER — Encounter (HOSPITAL_COMMUNITY): Payer: Self-pay | Admitting: Emergency Medicine

## 2014-01-21 ENCOUNTER — Inpatient Hospital Stay (HOSPITAL_COMMUNITY)
Admission: EM | Admit: 2014-01-21 | Discharge: 2014-01-27 | DRG: 227 | Disposition: A | Discharge status: Home / Self Care | Payer: Medicare Other | Attending: Cardiology | Admitting: Cardiology

## 2014-01-21 ENCOUNTER — Emergency Department (HOSPITAL_COMMUNITY): Payer: Medicare Other

## 2014-01-21 DIAGNOSIS — I2510 Atherosclerotic heart disease of native coronary artery without angina pectoris: Secondary | ICD-10-CM | POA: Diagnosis present

## 2014-01-21 DIAGNOSIS — I2589 Other forms of chronic ischemic heart disease: Secondary | ICD-10-CM

## 2014-01-21 DIAGNOSIS — Z9861 Coronary angioplasty status: Secondary | ICD-10-CM | POA: Diagnosis not present

## 2014-01-21 DIAGNOSIS — I10 Essential (primary) hypertension: Secondary | ICD-10-CM | POA: Diagnosis present

## 2014-01-21 DIAGNOSIS — Z95810 Presence of automatic (implantable) cardiac defibrillator: Secondary | ICD-10-CM | POA: Diagnosis not present

## 2014-01-21 DIAGNOSIS — Z951 Presence of aortocoronary bypass graft: Secondary | ICD-10-CM | POA: Diagnosis not present

## 2014-01-21 DIAGNOSIS — I509 Heart failure, unspecified: Secondary | ICD-10-CM | POA: Diagnosis present

## 2014-01-21 DIAGNOSIS — F17200 Nicotine dependence, unspecified, uncomplicated: Secondary | ICD-10-CM | POA: Diagnosis present

## 2014-01-21 DIAGNOSIS — I5021 Acute systolic (congestive) heart failure: Secondary | ICD-10-CM | POA: Diagnosis present

## 2014-01-21 DIAGNOSIS — D696 Thrombocytopenia, unspecified: Secondary | ICD-10-CM | POA: Diagnosis present

## 2014-01-21 DIAGNOSIS — R5383 Other fatigue: Secondary | ICD-10-CM

## 2014-01-21 DIAGNOSIS — I442 Atrioventricular block, complete: Secondary | ICD-10-CM | POA: Diagnosis present

## 2014-01-21 DIAGNOSIS — E7800 Pure hypercholesterolemia, unspecified: Secondary | ICD-10-CM | POA: Diagnosis present

## 2014-01-21 DIAGNOSIS — R0602 Shortness of breath: Secondary | ICD-10-CM | POA: Diagnosis not present

## 2014-01-21 DIAGNOSIS — R5381 Other malaise: Secondary | ICD-10-CM | POA: Diagnosis present

## 2014-01-21 DIAGNOSIS — E785 Hyperlipidemia, unspecified: Secondary | ICD-10-CM | POA: Diagnosis present

## 2014-01-21 DIAGNOSIS — R0601 Orthopnea: Secondary | ICD-10-CM | POA: Diagnosis present

## 2014-01-21 DIAGNOSIS — I5023 Acute on chronic systolic (congestive) heart failure: Secondary | ICD-10-CM | POA: Diagnosis not present

## 2014-01-21 DIAGNOSIS — I441 Atrioventricular block, second degree: Secondary | ICD-10-CM

## 2014-01-21 DIAGNOSIS — I255 Ischemic cardiomyopathy: Secondary | ICD-10-CM

## 2014-01-21 DIAGNOSIS — Z79899 Other long term (current) drug therapy: Secondary | ICD-10-CM

## 2014-01-21 DIAGNOSIS — I252 Old myocardial infarction: Secondary | ICD-10-CM

## 2014-01-21 DIAGNOSIS — I5020 Unspecified systolic (congestive) heart failure: Secondary | ICD-10-CM | POA: Diagnosis not present

## 2014-01-21 DIAGNOSIS — E119 Type 2 diabetes mellitus without complications: Secondary | ICD-10-CM | POA: Diagnosis present

## 2014-01-21 HISTORY — DX: Other ventricular tachycardia: I47.29

## 2014-01-21 HISTORY — DX: Ventricular tachycardia: I47.2

## 2014-01-21 HISTORY — DX: Ventricular tachycardia, unspecified: I47.20

## 2014-01-21 LAB — TROPONIN I: Troponin I: <0.3 ng/mL (ref <0.30)

## 2014-01-21 LAB — URINALYSIS, ROUTINE W REFLEX MICROSCOPIC
BILIRUBIN URINE: NEGATIVE
Glucose, UA: NEGATIVE mg/dL
HGB URINE DIPSTICK: NEGATIVE
Ketones, ur: NEGATIVE mg/dL
Leukocytes, UA: NEGATIVE
Nitrite: NEGATIVE
PROTEIN: NEGATIVE mg/dL
Specific Gravity, Urine: 1.015 (ref 1.005–1.030)
UROBILINOGEN UA: 1 mg/dL (ref 0.0–1.0)
pH: 5.5 (ref 5.0–8.0)

## 2014-01-21 LAB — CBC WITH DIFFERENTIAL/PLATELET
Basophils Absolute: 0 10*3/uL (ref 0.0–0.1)
Basophils Relative: 0 % (ref 0–1)
Eosinophils Absolute: 0.1 10*3/uL (ref 0.0–0.7)
Eosinophils Relative: 4 % (ref 0–5)
HEMATOCRIT: 33.2 % — AB (ref 39.0–52.0)
Hemoglobin: 11.3 g/dL — ABNORMAL LOW (ref 13.0–17.0)
Lymphocytes Relative: 22 % (ref 12–46)
Lymphs Abs: 0.6 10*3/uL — ABNORMAL LOW (ref 0.7–4.0)
MCH: 31.9 pg (ref 26.0–34.0)
MCHC: 34 g/dL (ref 30.0–36.0)
MCV: 93.8 fL (ref 78.0–100.0)
MONO ABS: 0.3 10*3/uL (ref 0.1–1.0)
Monocytes Relative: 12 % (ref 3–12)
NEUTROS PCT: 61 % (ref 43–77)
Neutro Abs: 1.7 10*3/uL (ref 1.7–7.7)
Platelets: 84 10*3/uL — ABNORMAL LOW (ref 150–400)
RBC: 3.54 MIL/uL — ABNORMAL LOW (ref 4.22–5.81)
RDW: 14.9 % (ref 11.5–15.5)
WBC: 2.7 10*3/uL — ABNORMAL LOW (ref 4.0–10.5)

## 2014-01-21 LAB — CBC
HEMATOCRIT: 33.8 % — AB (ref 39.0–52.0)
Hemoglobin: 11.7 g/dL — ABNORMAL LOW (ref 13.0–17.0)
MCH: 32.1 pg (ref 26.0–34.0)
MCHC: 34.6 g/dL (ref 30.0–36.0)
MCV: 92.6 fL (ref 78.0–100.0)
Platelets: 106 10*3/uL — ABNORMAL LOW (ref 150–400)
RBC: 3.65 MIL/uL — AB (ref 4.22–5.81)
RDW: 14.7 % (ref 11.5–15.5)
WBC: 3.2 10*3/uL — AB (ref 4.0–10.5)

## 2014-01-21 LAB — COMPREHENSIVE METABOLIC PANEL
ALBUMIN: 3.2 g/dL — AB (ref 3.5–5.2)
ALT: 15 U/L (ref 0–53)
AST: 22 U/L (ref 0–37)
Alkaline Phosphatase: 76 U/L (ref 39–117)
BILIRUBIN TOTAL: 1.2 mg/dL (ref 0.3–1.2)
BUN: 24 mg/dL — ABNORMAL HIGH (ref 6–23)
CHLORIDE: 111 meq/L (ref 96–112)
CO2: 22 mEq/L (ref 19–32)
CREATININE: 1.39 mg/dL — AB (ref 0.50–1.35)
Calcium: 8.9 mg/dL (ref 8.4–10.5)
GFR calc Af Amer: 52 mL/min — ABNORMAL LOW (ref >90)
GFR calc non Af Amer: 45 mL/min — ABNORMAL LOW (ref >90)
Glucose, Bld: 199 mg/dL — ABNORMAL HIGH (ref 70–99)
Potassium: 3.9 mEq/L (ref 3.7–5.3)
Sodium: 144 mEq/L (ref 137–147)
Total Protein: 5.9 g/dL — ABNORMAL LOW (ref 6.0–8.3)

## 2014-01-21 LAB — BASIC METABOLIC PANEL
BUN: 22 mg/dL (ref 6–23)
CHLORIDE: 109 meq/L (ref 96–112)
CO2: 19 meq/L (ref 19–32)
CREATININE: 1.34 mg/dL (ref 0.50–1.35)
Calcium: 9 mg/dL (ref 8.4–10.5)
GFR calc Af Amer: 54 mL/min — ABNORMAL LOW (ref >90)
GFR calc non Af Amer: 47 mL/min — ABNORMAL LOW (ref >90)
Glucose, Bld: 134 mg/dL — ABNORMAL HIGH (ref 70–99)
Potassium: 3.9 mEq/L (ref 3.7–5.3)
Sodium: 141 mEq/L (ref 137–147)

## 2014-01-21 LAB — I-STAT TROPONIN, ED: Troponin i, poc: 0.02 ng/mL (ref 0.00–0.08)

## 2014-01-21 LAB — PRO B NATRIURETIC PEPTIDE
Pro B Natriuretic peptide (BNP): 3731 pg/mL — ABNORMAL HIGH (ref 0–450)
Pro B Natriuretic peptide (BNP): 3379 pg/mL — ABNORMAL HIGH (ref 0–450)

## 2014-01-21 LAB — MAGNESIUM: MAGNESIUM: 1.8 mg/dL (ref 1.5–2.5)

## 2014-01-21 LAB — HEMOGLOBIN A1C
HEMOGLOBIN A1C: 6.1 % — AB (ref <5.7)
Mean Plasma Glucose: 128 mg/dL — ABNORMAL HIGH (ref <117)

## 2014-01-21 LAB — TSH: TSH: 4.64 u[IU]/mL — AB (ref 0.350–4.500)

## 2014-01-21 MED ORDER — SPIRONOLACTONE 25 MG PO TABS
25.0000 mg | ORAL_TABLET | Freq: Every day | ORAL | Status: DC
  Administered 2014-01-22 – 2014-01-27 (×6): 25 mg via ORAL
  Filled 2014-01-21 (×6): qty 1

## 2014-01-21 MED ORDER — SODIUM CHLORIDE 0.9 % IV SOLN: 250.0000 mL | INTRAVENOUS | Status: DC | PRN

## 2014-01-21 MED ORDER — ASPIRIN EC 81 MG PO TBEC: 81.0000 mg | DELAYED_RELEASE_TABLET | Freq: Every day | ORAL | Status: DC

## 2014-01-21 MED ORDER — SODIUM CHLORIDE 0.9 % IJ SOLN
3.0000 mL | INTRAMUSCULAR | Status: DC | PRN
  Administered 2014-01-22: 3 mL via INTRAVENOUS

## 2014-01-21 MED ORDER — RAMIPRIL 2.5 MG PO CAPS
2.5000 mg | ORAL_CAPSULE | Freq: Two times a day (BID) | ORAL | Status: DC
  Administered 2014-01-22 – 2014-01-27 (×12): 2.5 mg via ORAL
  Filled 2014-01-21 (×13): qty 1

## 2014-01-21 MED ORDER — ALLOPURINOL 300 MG PO TABS
300.0000 mg | ORAL_TABLET | Freq: Every day | ORAL | Status: DC
  Administered 2014-01-22 – 2014-01-27 (×6): 300 mg via ORAL
  Filled 2014-01-21 (×6): qty 1

## 2014-01-21 MED ORDER — SODIUM CHLORIDE 0.9 % IJ SOLN
3.0000 mL | Freq: Two times a day (BID) | INTRAMUSCULAR | Status: DC
  Administered 2014-01-22 – 2014-01-27 (×11): 3 mL via INTRAVENOUS

## 2014-01-21 MED ORDER — FAMOTIDINE 20 MG PO TABS
20.0000 mg | ORAL_TABLET | Freq: Two times a day (BID) | ORAL | Status: DC
  Administered 2014-01-22 – 2014-01-27 (×12): 20 mg via ORAL
  Filled 2014-01-21 (×13): qty 1

## 2014-01-21 MED ORDER — DOCUSATE SODIUM 100 MG PO CAPS
100.0000 mg | ORAL_CAPSULE | Freq: Two times a day (BID) | ORAL | Status: DC
  Administered 2014-01-22 – 2014-01-27 (×12): 100 mg via ORAL
  Filled 2014-01-21 (×13): qty 1

## 2014-01-21 MED ORDER — SUCRALFATE 1 G PO TABS
1.0000 g | ORAL_TABLET | Freq: Three times a day (TID) | ORAL | Status: DC
  Administered 2014-01-23 – 2014-01-27 (×18): 1 g via ORAL
  Filled 2014-01-21 (×23): qty 1

## 2014-01-21 MED ORDER — NITROGLYCERIN 0.4 MG SL SUBL: 0.4000 mg | SUBLINGUAL_TABLET | SUBLINGUAL | Status: DC | PRN

## 2014-01-21 MED ORDER — ASPIRIN EC 81 MG PO TBEC
81.0000 mg | DELAYED_RELEASE_TABLET | Freq: Every day | ORAL | Status: DC
  Administered 2014-01-22 – 2014-01-27 (×6): 81 mg via ORAL
  Filled 2014-01-21 (×6): qty 1

## 2014-01-21 MED ORDER — INSULIN ASPART 100 UNIT/ML ~~LOC~~ SOLN
0.0000 [IU] | Freq: Three times a day (TID) | SUBCUTANEOUS | Status: DC
  Administered 2014-01-23: 2 [IU] via SUBCUTANEOUS
  Administered 2014-01-23 – 2014-01-24 (×3): 1 [IU] via SUBCUTANEOUS
  Administered 2014-01-25: 2 [IU] via SUBCUTANEOUS
  Administered 2014-01-26 – 2014-01-27 (×4): 1 [IU] via SUBCUTANEOUS

## 2014-01-21 MED ORDER — NITROGLYCERIN 2 % TD OINT
0.5000 [in_us] | TOPICAL_OINTMENT | Freq: Four times a day (QID) | TRANSDERMAL | Status: DC
Start: 2014-01-22 — End: 2014-01-27
  Administered 2014-01-22 – 2014-01-27 (×22): 0.5 [in_us] via TOPICAL
  Filled 2014-01-21 (×5): qty 30
  Filled 2014-01-21: qty 1
  Filled 2014-01-21 (×24): qty 30

## 2014-01-21 MED ORDER — TICAGRELOR 90 MG PO TABS
90.0000 mg | ORAL_TABLET | Freq: Two times a day (BID) | ORAL | Status: DC
  Administered 2014-01-22: 90 mg via ORAL
  Filled 2014-01-21 (×3): qty 1

## 2014-01-21 MED ORDER — ATORVASTATIN CALCIUM 20 MG PO TABS
20.0000 mg | ORAL_TABLET | Freq: Every day | ORAL | Status: DC
  Administered 2014-01-22 – 2014-01-27 (×6): 20 mg via ORAL
  Filled 2014-01-21 (×6): qty 1

## 2014-01-21 MED ORDER — HEPARIN SODIUM (PORCINE) 5000 UNIT/ML IJ SOLN
5000.0000 [IU] | Freq: Three times a day (TID) | INTRAMUSCULAR | Status: DC
  Administered 2014-01-22 (×2): 5000 [IU] via SUBCUTANEOUS
  Filled 2014-01-21 (×5): qty 1

## 2014-01-21 NOTE — ED Provider Notes (Signed)
CSN: [id number]     Arrival date & time 01/21/14  1340 History   First MD Initiated Contact with Patient 01/21/14 1410     Chief Complaint  Patient presents with  . Shortness of Breath      HPI Pt was seen at 1435. Per pt, c/o gradual onset and worsening of persistent SOB for the past 2 weeks. Has been associated with generalized weakness/fatigue. SOB worsens with exertion. States he has been waking up from sleep during the night c/o SOB. Pt has been wearing his O2 N/C qhs without improvement. Denies CP/palpitations, no cough, no abd pain, no N/V/D, no back pain, no fevers.    Past Medical History  Diagnosis Date  . CAD (coronary artery disease)     s/p CABG; s/p Pacemaker  . Systolic CHF with reduced left ventricular function, NYHA class 2   . Ischemic cardiomyopathy     s/p ICD  . Hypertension   . Hyperlipidemia   . Pacemaker   . Diabetes mellitus   . Gout   . Hx of echocardiogram 2004    EF 20-30%  . Paroxysmal ventricular tachycardia    Past Surgical History  Procedure Laterality Date  . Coronary artery bypass graft    . Cardiac defibrillator placement      generator change 10/12/11  . Bowel resection    . Coronary angioplasty with stent placement      History  Substance Use Topics  . Smoking status: Current Some Day Smoker    Types: Pipe, Cigars  . Smokeless tobacco: Not on file     Comment: every "now and then"  . Alcohol Use: No     Comment: occasional    Review of Systems .ROS: Statement: All systems negative except as marked or noted in the HPI; Constitutional: Negative for fever and chills. +generalized weakness/fatigue.; ; Eyes: Negative for eye pain, redness and discharge. ; ; ENMT: Negative for ear pain, hoarseness, nasal congestion, sinus pressure and sore throat. ; ; Cardiovascular: +SOB, DOE. Negative for chest pain, palpitations, diaphoresis, and peripheral edema. ; ; Respiratory: Negative for cough, wheezing and stridor. ; ; Gastrointestinal: Negative  for nausea, vomiting, diarrhea, abdominal pain, blood in stool, hematemesis, jaundice and rectal bleeding. . ; ; Genitourinary: Negative for dysuria, flank pain and hematuria. ; ; Musculoskeletal: Negative for back pain and neck pain. Negative for swelling and trauma.; ; Skin: Negative for pruritus, rash, abrasions, blisters, bruising and skin lesion.; ; Neuro: Negative for headache, lightheadedness and neck stiffness. Negative for altered level of consciousness , altered mental status, extremity weakness, paresthesias, involuntary movement, seizure and syncope.        Allergies  Review of patient's allergies indicates no known allergies.  Home Medications   Prior to Admission medications   Medication Sig Start Date End Date Taking? Authorizing Provider  allopurinol (ZYLOPRIM) 300 MG tablet Take 300 mg by mouth daily.      Historical Provider, MD  aspirin 81 MG tablet Take 81 mg by mouth daily.      Historical Provider, MD  atorvastatin (LIPITOR) 20 MG tablet Take 20 mg by mouth daily.      Historical Provider, MD  carvedilol (COREG) 6.25 MG tablet Take 6.25 mg by mouth daily.     Historical Provider, MD  docusate sodium (COLACE) 100 MG capsule Take 100 mg by mouth 2 (two) times daily.    Historical Provider, MD  docusate sodium (COLACE) 100 MG capsule Take 1 capsule (100 mg total) by mouth  every 12 (twelve) hours. 01/07/14   Ruthell Rummage Dammen, PA-C  famotidine (PEPCID) 20 MG tablet Take 20 mg by mouth 2 (two) times daily.      Historical Provider, MD  NITROSTAT 0.4 MG SL tablet Place 0.4 mg under the tongue every 5 (five) minutes as needed.  12/05/12   Historical Provider, MD  ramipril (ALTACE) 2.5 MG capsule Take 2.5 mg by mouth 2 (two) times daily.      Historical Provider, MD  spironolactone (ALDACTONE) 25 MG tablet Take 25 mg by mouth daily.      Historical Provider, MD  sucralfate (CARAFATE) 1 G tablet Take 1 g by mouth 4 (four) times daily.     Historical Provider, MD  Ticagrelor (BRILINTA)  90 MG TABS tablet Take 90 mg by mouth 2 (two) times daily.      Historical Provider, MD   BP 145/78  Pulse 69  Temp(Src) 97.9 F (36.6 C) (Oral)  Resp 18  Ht 5\' 3"  (1.6 m)  Wt 153 lb 8 oz (69.627 kg)  BMI 27.20 kg/m2  SpO2 98% Physical Exam 1440: Physical examination:  Nursing notes reviewed; Vital signs and O2 SAT reviewed;  Constitutional: Well developed, Well nourished, Well hydrated, In no acute distress; Head:  Normocephalic, atraumatic; Eyes: EOMI, PERRL, No scleral icterus; ENMT: Mouth and pharynx normal, Mucous membranes moist; Neck: Supple, Full range of motion, No lymphadenopathy; Cardiovascular: Regular rate and rhythm, No gallop; Respiratory: Breath sounds coarse & equal bilaterally, No wheezes.  Speaking full sentences with ease, Normal respiratory effort/excursion; Chest: Nontender, Movement normal; Abdomen: Soft, Nontender, Nondistended, Normal bowel sounds; Genitourinary: No CVA tenderness; Extremities: Pulses normal, No tenderness, No edema, No calf edema or asymmetry.; Neuro: AA&Ox3, Major CN grossly intact.  Speech clear. No gross focal motor or sensory deficits in extremities.; Skin: Color normal, Warm, Dry.   ED Course  Procedures     EKG Interpretation   Date/Time:  Tuesday Jan 21 2014 13:44:52 EDT Ventricular Rate:  70 PR Interval:    QRS Duration: 152 QT Interval:  516 QTC Calculation: 557 R Axis:   123 Text Interpretation:  Undetermined rhythm Right bundle branch block Left  posterior fascicular block  Bifascicular block Inferior infarct ,  age undetermined Abnormal ECG Confirmed by [facility]  MD, Klayton Monie [phone])  on 01/21/2014 3:16:34 PM    EKG Interpretation  Date/Time:  Tuesday Jan 21 2014 15:21:00 EDT Ventricular Rate:  70 PR Interval:  71 QRS Duration: 164 QT Interval:  507 QTC Calculation: 547 R Axis:   146 Text Interpretation:  Wandering atrial pacemaker Multiple premature complexes, vent  Nonspecific intraventricular conduction delay  Confirmed by [facility]  MD, Nunzio Cory [phone]) on 01/21/2014 3:43:18 PM         MDM  MDM Reviewed: previous chart, nursing note and vitals Reviewed previous: labs, ECG and ultrasound Interpretation: labs, ECG and x-ray    Results for orders placed during the hospital encounter of 01/21/14  CBC      Result Value Ref Range   WBC 3.2 (*) 4.0 - 10.5 K/uL   RBC 3.65 (*) 4.22 - 5.81 MIL/uL   Hemoglobin 11.7 (*) 13.0 - 17.0 g/dL   HCT 33.8 (*) 39.0 - 52.0 %   MCV 92.6  78.0 - 100.0 fL   MCH 32.1  26.0 - 34.0 pg   MCHC 34.6  30.0 - 36.0 g/dL   RDW 14.7  11.5 - 15.5 %   Platelets 106 (*) 150 - 400 K/uL  BASIC METABOLIC PANEL  Result Value Ref Range   Sodium 141  137 - 147 mEq/L   Potassium 3.9  3.7 - 5.3 mEq/L   Chloride 109  96 - 112 mEq/L   CO2 19  19 - 32 mEq/L   Glucose, Bld 134 (*) 70 - 99 mg/dL   BUN 22  6 - 23 mg/dL   Creatinine, Ser 1.34  0.50 - 1.35 mg/dL   Calcium 9.0  8.4 - 10.5 mg/dL   GFR calc non Af Amer 47 (*) >90 mL/min   GFR calc Af Amer 54 (*) >90 mL/min  PRO B NATRIURETIC PEPTIDE      Result Value Ref Range   Pro B Natriuretic peptide (BNP) 3731.0 (*) 0 - 450 pg/mL  I-STAT TROPOININ, ED      Result Value Ref Range   Troponin i, poc 0.02  0.00 - 0.08 ng/mL   Comment 3            Dg Chest 2 View 01/21/2014   CLINICAL DATA:  80 year old male with shortness of breath and fatigue. Initial encounter.  EXAM: CHEST  2 VIEW  COMPARISON:  01/16/2013 and earlier.  FINDINGS: Portable AP semi upright view at 1441 hrs. Stable cardiomegaly and left chest cardiac AICD. Sequelae of CABG. Mildly lower lung volumes. Allowing for this, the lungs are clear. No pneumothorax or edema. No acute osseous abnormality identified.  IMPRESSION: Low lung volumes, otherwise no acute cardiopulmonary abnormality.   Electronically Signed   By: Lars Pinks M.D.   On: 01/21/2014 14:56    1550:  Pt ambulated with O2 Sats dropping to 86% R/A. Pt continues to deny CP/palpitations. BNP elevated, no  old to compare; will dose IV lasix. Dx and testing d/w pt and family.  Questions answered.  Verb understanding, agreeable to admit.  T/C to [facility] Dr. Joylene Draft (on call for Dr. Ardeth Perfect), case discussed, including:  HPI, pertinent PM/SHx, VS/PE, dx testing, ED course and treatment:  Agrees pt's dyspnea appears to be cardiac related, requests to call pt's Cards Dr. [name] to admit. T/C to Cardiology Dr. [name], case discussed, including:  HPI, pertinent PM/SHx, VS/PE, dx testing, ED course and treatment:  Agreeable to admit, requests he will come to the ED for further evaluation/admission.   Alfonzo Feller, DO 01/24/14 2349

## 2014-01-21 NOTE — Consult Note (Signed)
ELECTROPHYSIOLOGY CONSULT NOTE    Patient ID: [name] MRN: [id number], DOB/AGE: 1927-01-21 80 y.o.  Admit date: 01/21/2014 Date of Consult: 01-21-2014  Primary Physician: [name], MD Primary Cardiologist: [name] Electrophysiologist: [name]  Reason for Consultation: heart block  HPI:  [name] is a 80 y.o. male with a past medical history significant for CAD s/p CABG, ICM s/p ICD implantation, hypertension, diabetes, and appropriate ICD therapy.  For the last couple of weeks, he has had progressive shortness of breath and decreased exercise tolerance.  He presented to the ER today for evaluation.  Telemetry demonstrated possible high grade heart block and EP was asked to evaluate.    Device was interrogated and found to be functioning normally.  Pt has a single chamber MDT ICD programmed VVI 40 (reprogrammed to VVI 50 at this time).  No recent therapies for ventricular arrhythmias.  See paper chart for full details.    He states that he has had lower extremity edema and dyspnea on exertion.  He denies chest pain, palpitations, fevers, chills, nausea vomiting.  ROS otherwise negative except as outlined above.    Echocardiogram pending this admission.   Past Medical History  Diagnosis Date  . CAD (coronary artery disease)     s/p CABG; s/p Pacemaker  . Systolic CHF with reduced left ventricular function, NYHA class 2   . Ischemic cardiomyopathy     s/p ICD  . Hypertension   . Hyperlipidemia   . Diabetes mellitus   . Gout   . Hx of echocardiogram 2004    EF 20-30%  . Paroxysmal ventricular tachycardia      Surgical History:  Past Surgical History  Procedure Laterality Date  . Coronary artery bypass graft    . Cardiac defibrillator placement      generator change 10/12/11  . Bowel resection    . Coronary angioplasty with stent placement       Inpatient Medications:  No current facility-administered medications for this encounter. Current outpatient  prescriptions:allopurinol (ZYLOPRIM) 300 MG tablet, Take 300 mg by mouth daily.  , Disp: , Rfl: ;  aspirin 81 MG tablet, Take 81 mg by mouth daily.  , Disp: , Rfl: ;  atorvastatin (LIPITOR) 20 MG tablet, Take 20 mg by mouth daily.  , Disp: , Rfl: ;  carvedilol (COREG) 6.25 MG tablet, Take 6.25 mg by mouth daily. , Disp: , Rfl: ;  docusate sodium (COLACE) 100 MG capsule, Take 100 mg by mouth 2 (two) times daily., Disp: , Rfl:  docusate sodium (COLACE) 100 MG capsule, Take 1 capsule (100 mg total) by mouth every 12 (twelve) hours., Disp: 30 capsule, Rfl: 0;  famotidine (PEPCID) 20 MG tablet, Take 20 mg by mouth 2 (two) times daily.  , Disp: , Rfl: ;  NITROSTAT 0.4 MG SL tablet, Place 0.4 mg under the tongue every 5 (five) minutes as needed. , Disp: , Rfl: ;  ramipril (ALTACE) 2.5 MG capsule, Take 2.5 mg by mouth 2 (two) times daily.  , Disp: , Rfl:  spironolactone (ALDACTONE) 25 MG tablet, Take 25 mg by mouth daily.  , Disp: , Rfl: ;  sucralfate (CARAFATE) 1 G tablet, Take 1 g by mouth 4 (four) times daily. , Disp: , Rfl: ;  Ticagrelor (BRILINTA) 90 MG TABS tablet, Take 90 mg by mouth 2 (two) times daily.  , Disp: , Rfl:    Allergies: No Known Allergies  History   Social History  . Marital Status: Divorced  Spouse Name: N/A    Number of Children: N/A  . Years of Education: N/A   Occupational History  . Not on file.   Social History Main Topics  . Smoking status: Current Some Day Smoker    Types: Pipe, Cigars  . Smokeless tobacco: Not on file     Comment: every "now and then"  . Alcohol Use: No     Comment: occasional  . Drug Use: No  . Sexual Activity: Not on file   Other Topics Concern  . Not on file   Social History Narrative  . No narrative on file     Family History - CAD  Physical Exam: Filed Vitals:   01/21/14 1830 01/21/14 1900 01/21/14 2000 01/21/14 2003  BP: 149/83 144/78 131/63 131/63  Pulse: 61 76 73   Temp:      TempSrc:      Resp: 16 14 18 16   Height:        Weight:      SpO2: 98% 97% 97% 98%    GEN- The patient is elderly appearing, alert and oriented x 3 today.   Head- normocephalic, atraumatic Eyes-  Sclera clear, conjunctiva pink Ears- hearing intact Oropharynx- clear Neck- supple,  Lungs- Clear to ausculation bilaterally, normal work of breathing Heart- bradycardic irregular rhythm,  GI- soft, NT, ND, + BS Extremities- no clubbing, cyanosis, or edema MS- no significant deformity or atrophy Skin- no rash or lesion Psych- euthymic mood, full affect Neuro- strength and sensation are intact   Labs:   Lab Results  Component Value Date   WBC 3.2* 01/21/2014   HGB 11.7* 01/21/2014   HCT 33.8* 01/21/2014   MCV 92.6 01/21/2014   PLT 106* 01/21/2014     Recent Labs Lab 01/21/14 1348  NA 141  K 3.9  CL 109  CO2 19  BUN 22  CREATININE 1.34  CALCIUM 9.0  GLUCOSE 134*   Lab Results  Component Value Date   CKTOTAL 66 05/20/2011   CKMB 2.9 05/19/2011   TROPONINI <0.30 06/21/2012     Radiology/Studies: Dg Chest 2 View 01/21/2014   CLINICAL DATA:  80 year old male with shortness of breath and fatigue. Initial encounter.  EXAM: CHEST  2 VIEW  COMPARISON:  01/16/2013 and earlier.  FINDINGS: Portable AP semi upright view at 1441 hrs. Stable cardiomegaly and left chest cardiac AICD. Sequelae of CABG. Mildly lower lung volumes. Allowing for this, the lungs are clear. No pneumothorax or edema. No acute osseous abnormality identified.  IMPRESSION: Low lung volumes, otherwise no acute cardiopulmonary abnormality.   Electronically Signed   By: Lars Pinks M.D.   On: 01/21/2014 14:56    EKG: sinus rhythm with advanced AV block  DEVICE HISTORY:   -Implantation of single chamber MDT ICD 07-16-2013 by Dr [name] for ICM, CHF, and NSVT with 6947 lead  -Gen change 10/2011 with placement of new 5076 pace sense lead secondary to decreased R waves.     Assessment and Plan:  1. Advanced AV block The patient has a h/o trifascicular block and very  long first degree AV block.  He has recently developed some decline in exercise tolerance though he is quite elderly.  His EKG today reveals second degree AV block.  I have reprogrammed his single chamber ICD VVI 50 for backup.  He wishes to discuss possible upgrade to CRT-D with Dr [name] in the morning.    2. Ischemic CM Repeat echo His on on a very good medicine regimen  3. HTN Stable No change required today  He is admitted to Dr [name].  Dr [name] to follow for EP in the am.

## 2014-01-21 NOTE — ED Notes (Signed)
He states hes felt fatigued and sob this week. He denies pain. hes a&ox4, able to speak in full sentences now.

## 2014-01-21 NOTE — ED Notes (Signed)
Pt c/o sob x 7 days, sts he uses oxygen to sleep at night. sts he has been feeling very fatigued also. Pt reports he has been sneezing a lot. Denies cough/fever/n/v/d. Nad, skin warm and dry, resp e/u.

## 2014-01-21 NOTE — ED Notes (Signed)
Cardiologist at bedside.  

## 2014-01-21 NOTE — ED Notes (Signed)
Patient ambulated in hallways with cane, patient had steady gait with minimal weakness. Patient SpO2 95-98% while ambulating. Rn assisted patient back to bed- patient then had increased work of breathing, respirations 30bpm. SpO2 86%- RN instructed patient to take deep breaths SpO2 increased to 95%/RA  MD made aware.

## 2014-01-21 NOTE — H&P (Signed)
[name] is an 80 y.o. male.   Chief Complaint:  progressive increasing shortness of breath x2 weeks associated with feeling tired fatigued HPI: Patient is 80 year old male with past medical history significant for coronary artery disease history of inferior wall myocardial infarction in 1994 status post CABG in 1994 he had LIMA to LAD, sequential saphenous vein graft to OM1 and OM 2 saphenous vein graft to PDA saphenous vein graft to high diagonal/ramus, history of congestive heart failure secondary to depressed LV systolic function, ischemic cardiomyopathy, status post ICD, hypertension, hypercholesteremia, status post PCI to left circumflex in August of 2012, history of nonsustained VT in the past, diabetes mellitus, history of gouty arthritis, came to the ER complaining of progressive increasing shortness of breath for last 2 weeks associated with feeling weak and tired. Patient also gives history of PND orthopnea and minimal leg swelling. Patient denies any chest pain nausea or vomiting or diaphoresis. Denies palpitation lightheadedness or syncope. Denies any ICD discharges. Patient states lately his activity has been limited and gets progressive sure breath with minimal exertion. EKG done in the ER showed sinus rhythm with complete heart block with ventricular escape heart rhythm, old inferior wall myocardial infarction and right bundle branch block.  Past Medical History  Diagnosis Date  . CAD (coronary artery disease)     s/p CABG; s/p Pacemaker  . Systolic CHF with reduced left ventricular function, NYHA class 2   . Ischemic cardiomyopathy     s/p ICD  . Hypertension   . Hyperlipidemia   . Pacemaker   . Diabetes mellitus   . Gout   . Hx of echocardiogram 2004    EF 20-30%  . Paroxysmal ventricular tachycardia     Past Surgical History  Procedure Laterality Date  . Coronary artery bypass graft    . Cardiac defibrillator placement      generator change 10/12/11  . Bowel  resection    . Coronary angioplasty with stent placement      History reviewed. No pertinent family history. Social History:  reports that he has been smoking Pipe and Cigars.  He does not have any smokeless tobacco history on file. He reports that he does not drink alcohol or use illicit drugs.  Allergies: No Known Allergies   (Not in a hospital admission)  Results for orders placed during the hospital encounter of 01/21/14 (from the past 48 hour(s))  CBC     Status: Abnormal   Collection Time    01/21/14  1:48 PM      Result Value Ref Range   WBC 3.2 (*) 4.0 - 10.5 K/uL   RBC 3.65 (*) 4.22 - 5.81 MIL/uL   Hemoglobin 11.7 (*) 13.0 - 17.0 g/dL   HCT 33.8 (*) 39.0 - 52.0 %   MCV 92.6  78.0 - 100.0 fL   MCH 32.1  26.0 - 34.0 pg   MCHC 34.6  30.0 - 36.0 g/dL   RDW 14.7  11.5 - 15.5 %   Platelets 106 (*) 150 - 400 K/uL   Comment: PLATELET COUNT CONFIRMED BY SMEAR  BASIC METABOLIC PANEL     Status: Abnormal   Collection Time    01/21/14  1:48 PM      Result Value Ref Range   Sodium 141  137 - 147 mEq/L   Potassium 3.9  3.7 - 5.3 mEq/L   Chloride 109  96 - 112 mEq/L   CO2 19  19 - 32 mEq/L   Glucose, Bld  134 (*) 70 - 99 mg/dL   BUN 22  6 - 23 mg/dL   Creatinine, Ser 1.34  0.50 - 1.35 mg/dL   Calcium 9.0  8.4 - 10.5 mg/dL   GFR calc non Af Amer 47 (*) >90 mL/min   GFR calc Af Amer 54 (*) >90 mL/min   Comment: (NOTE)     The eGFR has been calculated using the CKD EPI equation.     This calculation has not been validated in all clinical situations.     eGFR's persistently <90 mL/min signify possible Chronic Kidney     Disease.  PRO B NATRIURETIC PEPTIDE     Status: Abnormal   Collection Time    01/21/14  1:48 PM      Result Value Ref Range   Pro B Natriuretic peptide (BNP) 3731.0 (*) 0 - 450 pg/mL  I-STAT TROPOININ, ED     Status: None   Collection Time    01/21/14  2:16 PM      Result Value Ref Range   Troponin i, poc 0.02  0.00 - 0.08 ng/mL   Comment 3             Comment: Due to the release kinetics of cTnI,     a negative result within the first hours     of the onset of symptoms does not rule out     myocardial infarction with certainty.     If myocardial infarction is still suspected,     repeat the test at appropriate intervals.  URINALYSIS, ROUTINE W REFLEX MICROSCOPIC     Status: None   Collection Time    01/21/14  3:40 PM      Result Value Ref Range   Color, Urine YELLOW  YELLOW   APPearance CLEAR  CLEAR   Specific Gravity, Urine 1.015  1.005 - 1.030   pH 5.5  5.0 - 8.0   Glucose, UA NEGATIVE  NEGATIVE mg/dL   Hgb urine dipstick NEGATIVE  NEGATIVE   Bilirubin Urine NEGATIVE  NEGATIVE   Ketones, ur NEGATIVE  NEGATIVE mg/dL   Protein, ur NEGATIVE  NEGATIVE mg/dL   Urobilinogen, UA 1.0  0.0 - 1.0 mg/dL   Nitrite NEGATIVE  NEGATIVE   Leukocytes, UA NEGATIVE  NEGATIVE   Comment: MICROSCOPIC NOT DONE ON URINES WITH NEGATIVE PROTEIN, BLOOD, LEUKOCYTES, NITRITE, OR GLUCOSE <1000 mg/dL.   Dg Chest 2 View  01/21/2014   CLINICAL DATA:  80 year old male with shortness of breath and fatigue. Initial encounter.  EXAM: CHEST  2 VIEW  COMPARISON:  01/16/2013 and earlier.  FINDINGS: Portable AP semi upright view at 1441 hrs. Stable cardiomegaly and left chest cardiac AICD. Sequelae of CABG. Mildly lower lung volumes. Allowing for this, the lungs are clear. No pneumothorax or edema. No acute osseous abnormality identified.  IMPRESSION: Low lung volumes, otherwise no acute cardiopulmonary abnormality.   Electronically Signed   By: Lars Pinks M.D.   On: 01/21/2014 14:56    Review of Systems  Constitutional: Negative for fever.  Eyes: Negative for blurred vision and double vision.  Respiratory: Negative for cough, hemoptysis and sputum production.   Cardiovascular: Positive for orthopnea, leg swelling and PND. Negative for chest pain and palpitations.  Gastrointestinal: Negative for vomiting, abdominal pain and diarrhea.  Skin: Negative for rash.   Neurological: Negative for dizziness and headaches.    Blood pressure 145/78, pulse 69, temperature 97.9 F (36.6 C), temperature source Oral, resp. rate 18, height $RemoveBe'5\' 3"'SGdtQjxef$  (1.6 m),  weight 69.627 kg (153 lb 8 oz), SpO2 98.00%. Physical Exam  Constitutional: He is oriented to person, place, and time.  HENT:  Head: Normocephalic and atraumatic.  Eyes: Conjunctivae are normal. Left eye exhibits no discharge. No scleral icterus.  Neck: Normal range of motion. Neck supple. No JVD present. No tracheal deviation present. No thyromegaly present.  Cardiovascular: Normal rate and regular rhythm.   Murmur (Soft systolic murmur and S3 gallop noted) heard. Respiratory:  Decreased breath sound at bases  GI: Soft. Bowel sounds are normal. He exhibits no distension. There is no tenderness. There is no rebound.  Musculoskeletal:  No clubbing cyanosis trace edema noted  Neurological: He is alert and oriented to person, place, and time.     Assessment/Plan Exertional dyspnea/progressive weakness and tiredness probably secondary to complete heart block rule out progression of CAD Coronary artery disease history of MI in the past status post CABG and PCI to left circumflex in August of 2012 Ischemic artery myopathy status post ICD Mild decompensated systolic heart failure Hypertension Diabetes matters Hypercholesteremia History of nonsustained VT in the past History of gouty arthritis Plan As per orders Hold AV blocking medications for now We'll get EP consult Will schedule for nuclear stress test once stable from EP point of view. Check old records Clent Demark 01/21/2014, 4:20 PM

## 2014-01-21 NOTE — ED Notes (Signed)
[employer] at bedside to interrogate pt's pacemaker.

## 2014-01-22 ENCOUNTER — Encounter (HOSPITAL_COMMUNITY)
Admission: EM | Disposition: A | Discharge status: Home / Self Care | Payer: Self-pay | Source: Home / Self Care | Attending: Cardiology

## 2014-01-22 DIAGNOSIS — I442 Atrioventricular block, complete: Secondary | ICD-10-CM

## 2014-01-22 DIAGNOSIS — I2589 Other forms of chronic ischemic heart disease: Secondary | ICD-10-CM

## 2014-01-22 HISTORY — PX: BI-VENTRICULAR IMPLANTABLE CARDIOVERTER DEFIBRILLATOR UPGRADE: SHX5461

## 2014-01-22 LAB — BASIC METABOLIC PANEL
BUN: 23 mg/dL (ref 6–23)
CALCIUM: 9.2 mg/dL (ref 8.4–10.5)
CO2: 21 mEq/L (ref 19–32)
Chloride: 111 mEq/L (ref 96–112)
Creatinine, Ser: 1.28 mg/dL (ref 0.50–1.35)
GFR, EST AFRICAN AMERICAN: 57 mL/min — AB (ref >90)
GFR, EST NON AFRICAN AMERICAN: 49 mL/min — AB (ref >90)
Glucose, Bld: 128 mg/dL — ABNORMAL HIGH (ref 70–99)
Potassium: 3.9 mEq/L (ref 3.7–5.3)
Sodium: 145 mEq/L (ref 137–147)

## 2014-01-22 LAB — GLUCOSE, CAPILLARY
GLUCOSE-CAPILLARY: 111 mg/dL — AB (ref 70–99)
GLUCOSE-CAPILLARY: 97 mg/dL (ref 70–99)
Glucose-Capillary: 120 mg/dL — ABNORMAL HIGH (ref 70–99)
Glucose-Capillary: 134 mg/dL — ABNORMAL HIGH (ref 70–99)
Glucose-Capillary: 124 mg/dL — ABNORMAL HIGH (ref 70–99)
Glucose-Capillary: 110 mg/dL — ABNORMAL HIGH (ref 70–99)

## 2014-01-22 LAB — URINE CULTURE
COLONY COUNT: NO GROWTH
CULTURE: NO GROWTH

## 2014-01-22 LAB — PROTIME-INR
INR: 1.12 (ref 0.00–1.49)
Prothrombin Time: 14.2 seconds (ref 11.6–15.2)

## 2014-01-22 LAB — TROPONIN I: Troponin I: <0.3 ng/mL (ref <0.30)

## 2014-01-22 LAB — MRSA PCR SCREENING: MRSA BY PCR: NEGATIVE

## 2014-01-22 SURGERY — BI-VENTRICULAR IMPLANTABLE CARDIOVERTER DEFIBRILLATOR UPGRADE
Anesthesia: LOCAL

## 2014-01-22 MED ORDER — FENTANYL CITRATE 0.05 MG/ML IJ SOLN
INTRAMUSCULAR | Status: AC
  Filled 2014-01-22: qty 2

## 2014-01-22 MED ORDER — CHLORHEXIDINE GLUCONATE 4 % EX LIQD
60.0000 mL | Freq: Once | CUTANEOUS | Status: AC
  Administered 2014-01-22: 4 via TOPICAL

## 2014-01-22 MED ORDER — SODIUM CHLORIDE 0.9 % IR SOLN
80.0000 mg | Status: AC
  Administered 2014-01-22: 80 mg
  Filled 2014-01-22: qty 2

## 2014-01-22 MED ORDER — CEFAZOLIN SODIUM-DEXTROSE 2-3 GM-% IV SOLR
2.0000 g | Freq: Four times a day (QID) | INTRAVENOUS | Status: AC
  Administered 2014-01-22 – 2014-01-23 (×3): 2 g via INTRAVENOUS
  Filled 2014-01-22 (×4): qty 50

## 2014-01-22 MED ORDER — HEPARIN (PORCINE) IN NACL 2-0.9 UNIT/ML-% IJ SOLN
INTRAMUSCULAR | Status: AC
  Filled 2014-01-22: qty 500

## 2014-01-22 MED ORDER — CEFAZOLIN SODIUM-DEXTROSE 2-3 GM-% IV SOLR
2.0000 g | INTRAVENOUS | Status: AC
  Administered 2014-01-22: 2 g via INTRAVENOUS
  Filled 2014-01-22: qty 50

## 2014-01-22 MED ORDER — MIDAZOLAM HCL 5 MG/5ML IJ SOLN
INTRAMUSCULAR | Status: AC
  Filled 2014-01-22: qty 5

## 2014-01-22 MED ORDER — MIDAZOLAM HCL 5 MG/5ML IJ SOLN
INTRAMUSCULAR | Status: AC
Start: 2014-01-22 — End: 2014-01-22
  Filled 2014-01-22: qty 5

## 2014-01-22 MED ORDER — LIDOCAINE HCL (PF) 1 % IJ SOLN
INTRAMUSCULAR | Status: AC
  Filled 2014-01-22: qty 60

## 2014-01-22 MED ORDER — CHLORHEXIDINE GLUCONATE 4 % EX LIQD
60.0000 mL | Freq: Once | CUTANEOUS | Status: AC
  Filled 2014-01-22: qty 60

## 2014-01-22 MED ORDER — ACETAMINOPHEN 325 MG PO TABS
325.0000 mg | ORAL_TABLET | ORAL | Status: DC | PRN
  Administered 2014-01-22: 325 mg via ORAL
  Administered 2014-01-23: 650 mg via ORAL
  Administered 2014-01-23: 325 mg via ORAL
  Administered 2014-01-23 – 2014-01-24 (×2): 650 mg via ORAL
  Filled 2014-01-22: qty 2
  Filled 2014-01-22: qty 1
  Filled 2014-01-22 (×2): qty 2
  Filled 2014-01-22: qty 1

## 2014-01-22 MED ORDER — ONDANSETRON HCL 4 MG/2ML IJ SOLN: 4.0000 mg | Freq: Four times a day (QID) | INTRAMUSCULAR | Status: DC | PRN

## 2014-01-22 MED FILL — Medication: Qty: 1 | Status: AC

## 2014-01-22 NOTE — Care Management Note (Addendum)
    Page 1 of 2   01/24/2014     12:38:53 PM CARE MANAGEMENT NOTE 01/24/2014  Patient:  [name], [name]   Account Number:  [id number]  Date Initiated:  01/22/2014  Documentation initiated by:  Elissa Hefty  Subjective/Objective Assessment:   adm w 3rd deg heart block     Action/Plan:   lives alone, pcp dr Darl Householder   Anticipated DC Date:  01/25/2014   Anticipated DC Plan:  [place]  CM consult      [facility] Choice  HOME HEALTH   Choice offered to / List presented to:  C-1 Patient        [place] arranged  HH-1 RN  [place]   Status of service:   Medicare Important Message given?  YES (If response is "NO", the following Medicare IM given date fields will be blank) Date Medicare IM given:  01/24/2014 Date Additional Medicare IM given:    Discharge Disposition:  [place]  Per UR Regulation:  Reviewed for med. necessity/level of care/duration of stay  If discussed at [place] of Stay Meetings, dates discussed:    Comments:  5/22  1234p debbie Tremane Spurgeon rn,bsn pt states he forgot to tell us he's act w hospice out of [place]. [facility] out [facility] [phone] they would like call and fax dc summary at disch.  5/22 1026a debbie Analeise Mccleery rn,bsn spoke w pt, hx of hhc and he lives alone. he would like hhrn to ck on him. has supp neighbor. has cane. used [facility] in past and would like them again. ref to donna w [facility] for hhrn. had chf orders for hhc but will ask for final orders at disch. [facility] will not see pt as pt act w hospice out of [place].

## 2014-01-22 NOTE — H&P (View-Only) (Signed)
 ELECTROPHYSIOLOGY CONSULT NOTE    Patient ID: [name] MRN: [id number], DOB/AGE: 09/22/1926 80 y.o.  Admit date: 01/21/2014 Date of Consult: 01-21-2014  Primary Physician: HOLWERDA, SCOTT, MD Primary Cardiologist: Harwani Electrophysiologist: Taylor  Reason for Consultation: heart block  HPI:  [name] is a 80 y.o. male with a past medical history significant for CAD s/p CABG, ICM s/p ICD implantation, hypertension, diabetes, and appropriate ICD therapy.  For the last couple of weeks, he has had progressive shortness of breath and decreased exercise tolerance.  He presented to the ER today for evaluation.  Telemetry demonstrated possible high grade heart block and EP was asked to evaluate.    Device was interrogated and found to be functioning normally.  Pt has a single chamber MDT ICD programmed VVI 40 (reprogrammed to VVI 50 at this time).  No recent therapies for ventricular arrhythmias.  See paper chart for full details.    He states that he has had lower extremity edema and dyspnea on exertion.  He denies chest pain, palpitations, fevers, chills, nausea vomiting.  ROS otherwise negative except as outlined above.    Echocardiogram pending this admission.   Past Medical History  Diagnosis Date  . CAD (coronary artery disease)     s/p CABG; s/p Pacemaker  . Systolic CHF with reduced left ventricular function, NYHA class 2   . Ischemic cardiomyopathy     s/p ICD  . Hypertension   . Hyperlipidemia   . Diabetes mellitus   . Gout   . Hx of echocardiogram 2004    EF 20-30%  . Paroxysmal ventricular tachycardia      Surgical History:  Past Surgical History  Procedure Laterality Date  . Coronary artery bypass graft    . Cardiac defibrillator placement      generator change 10/12/11  . Bowel resection    . Coronary angioplasty with stent placement       Inpatient Medications:  No current facility-administered medications for this encounter. Current outpatient  prescriptions:allopurinol (ZYLOPRIM) 300 MG tablet, Take 300 mg by mouth daily.  , Disp: , Rfl: ;  aspirin 81 MG tablet, Take 81 mg by mouth daily.  , Disp: , Rfl: ;  atorvastatin (LIPITOR) 20 MG tablet, Take 20 mg by mouth daily.  , Disp: , Rfl: ;  carvedilol (COREG) 6.25 MG tablet, Take 6.25 mg by mouth daily. , Disp: , Rfl: ;  docusate sodium (COLACE) 100 MG capsule, Take 100 mg by mouth 2 (two) times daily., Disp: , Rfl:  docusate sodium (COLACE) 100 MG capsule, Take 1 capsule (100 mg total) by mouth every 12 (twelve) hours., Disp: 30 capsule, Rfl: 0;  famotidine (PEPCID) 20 MG tablet, Take 20 mg by mouth 2 (two) times daily.  , Disp: , Rfl: ;  NITROSTAT 0.4 MG SL tablet, Place 0.4 mg under the tongue every 5 (five) minutes as needed. , Disp: , Rfl: ;  ramipril (ALTACE) 2.5 MG capsule, Take 2.5 mg by mouth 2 (two) times daily.  , Disp: , Rfl:  spironolactone (ALDACTONE) 25 MG tablet, Take 25 mg by mouth daily.  , Disp: , Rfl: ;  sucralfate (CARAFATE) 1 G tablet, Take 1 g by mouth 4 (four) times daily. , Disp: , Rfl: ;  Ticagrelor (BRILINTA) 90 MG TABS tablet, Take 90 mg by mouth 2 (two) times daily.  , Disp: , Rfl:    Allergies: No Known Allergies  History   Social History  . Marital Status: Divorced      Spouse Name: N/A    Number of Children: N/A  . Years of Education: N/A   Occupational History  . Not on file.   Social History Main Topics  . Smoking status: Current Some Day Smoker    Types: Pipe, Cigars  . Smokeless tobacco: Not on file     Comment: every "now and then"  . Alcohol Use: No     Comment: occasional  . Drug Use: No  . Sexual Activity: Not on file   Other Topics Concern  . Not on file   Social History Narrative  . No narrative on file     Family History - CAD  Physical Exam: Filed Vitals:   01/21/14 1830 01/21/14 1900 01/21/14 2000 01/21/14 2003  BP: 149/83 144/78 131/63 131/63  Pulse: 61 76 73   Temp:      TempSrc:      Resp: 16 14 18 16   Height:        Weight:      SpO2: 98% 97% 97% 98%    GEN- The patient is elderly appearing, alert and oriented x 3 today.   Head- normocephalic, atraumatic Eyes-  Sclera clear, conjunctiva pink Ears- hearing intact Oropharynx- clear Neck- supple,  Lungs- Clear to ausculation bilaterally, normal work of breathing Heart- bradycardic irregular rhythm,  GI- soft, NT, ND, + BS Extremities- no clubbing, cyanosis, or edema MS- no significant deformity or atrophy Skin- no rash or lesion Psych- euthymic mood, full affect Neuro- strength and sensation are intact   Labs:   Lab Results  Component Value Date   WBC 3.2* 01/21/2014   HGB 11.7* 01/21/2014   HCT 33.8* 01/21/2014   MCV 92.6 01/21/2014   PLT 106* 01/21/2014     Recent Labs Lab 01/21/14 1348  NA 141  K 3.9  CL 109  CO2 19  BUN 22  CREATININE 1.34  CALCIUM 9.0  GLUCOSE 134*   Lab Results  Component Value Date   CKTOTAL 66 05/20/2011   CKMB 2.9 05/19/2011   TROPONINI <0.30 06/21/2012     Radiology/Studies: Dg Chest 2 View 01/21/2014   CLINICAL DATA:  80 year old male with shortness of breath and fatigue. Initial encounter.  EXAM: CHEST  2 VIEW  COMPARISON:  01/16/2013 and earlier.  FINDINGS: Portable AP semi upright view at 1441 hrs. Stable cardiomegaly and left chest cardiac AICD. Sequelae of CABG. Mildly lower lung volumes. Allowing for this, the lungs are clear. No pneumothorax or edema. No acute osseous abnormality identified.  IMPRESSION: Low lung volumes, otherwise no acute cardiopulmonary abnormality.   Electronically Signed   By: Lars Pinks M.D.   On: 01/21/2014 14:56    EKG: sinus rhythm with advanced AV block  DEVICE HISTORY:   -Implantation of single chamber MDT ICD 07-16-2013 by Dr [name] for ICM, CHF, and NSVT with 6947 lead  -Gen change 10/2011 with placement of new 5076 pace sense lead secondary to decreased R waves.     Assessment and Plan:  1. Advanced AV block The patient has a h/o trifascicular block and very  long first degree AV block.  He has recently developed some decline in exercise tolerance though he is quite elderly.  His EKG today reveals second degree AV block.  I have reprogrammed his single chamber ICD VVI 50 for backup.  He wishes to discuss possible upgrade to CRT-D with Dr [name] in the morning.    2. Ischemic CM Repeat echo His on on a very good medicine regimen  3. HTN Stable No change required today  He is admitted to Dr Harwani.  Dr Taylor to follow for EP in the am.   

## 2014-01-22 NOTE — Interval H&P Note (Signed)
History and Physical Interval Note: Patient is well known to me from prior clinic visits. He has developed worsening CHF despite maximal medical therapy and will undergo upgrade from a DDD PM to a BiV PPM.   01/22/2014 12:56 PM  [name]  has presented today for surgery, with the diagnosis of heart failure  The various methods of treatment have been discussed with the patient and family. After consideration of risks, benefits and other options for treatment, the patient has consented to  BiVentricular PPM as a surgical intervention .  The patient's history has been reviewed, patient examined, no change in status, stable for surgery.  I have reviewed the patient's chart and labs.  Questions were answered to the patient's satisfaction.     [name]

## 2014-01-22 NOTE — Progress Notes (Signed)
ELECTROPHYSIOLOGY ROUNDING NOTE    Patient Name: [name] Date of Encounter: 01/22/2014    SUBJECTIVE:Patient feels well this morning.  No chest pain, shortness of breath improved.  Admitted yesterday for heart failure exacerbation with intermittent high grade heart block.   CXR yesterday - ICD and pace/sense lead in RV; epicardial LV leads  TELEMETRY: Reviewed telemetry pt in sinus rhythm, occasional [place] I, alternating BBB, prolonged PR Filed Vitals:   01/22/14 0243 01/22/14 0247 01/22/14 0300 01/22/14 0400  BP:  147/63 120/57 152/82  Pulse:      Temp:  97.6 F (36.4 C)  97.8 F (36.6 C)  TempSrc:  Oral  Oral  Resp:  22 13 18   Height:      Weight: 154 lb 1.6 oz (69.9 kg)     SpO2:  100%  100%    Intake/Output Summary (Last 24 hours) at 01/22/14 0636 Last data filed at 01/22/14 0032  Gross per 24 hour  Intake      3 ml  Output      0 ml  Net      3 ml    CURRENT MEDICATIONS: . allopurinol  300 mg Oral Daily  . aspirin EC  81 mg Oral Daily  . atorvastatin  20 mg Oral Daily  . docusate sodium  100 mg Oral BID  . famotidine  20 mg Oral BID  . heparin  5,000 Units Subcutaneous 3 times per day  . insulin aspart  0-9 Units Subcutaneous TID WC  . nitroGLYCERIN  0.5 inch Topical 4 times per day  . ramipril  2.5 mg Oral BID  . sodium chloride  3 mL Intravenous Q12H  . spironolactone  25 mg Oral Daily  . sucralfate  1 g Oral TID AC & HS  . ticagrelor  90 mg Oral BID    Physical exam  stable appearing elderly man, NAD HEENT: Unremarkable,[name], AT Neck:  6 JVD, no thyromegally Back:  No CVA tenderness Lungs:  Clear with no wheezes, rales, or rhonchi HEART:  Regular rate rhythm, no murmurs, no rubs, no clicks Abd:  soft, positive bowel sounds, no organomegally, no rebound, no guarding Ext:  2 plus pulses, no edema, no cyanosis, no clubbing Skin:  No rashes no nodules Neuro:  CN II through XII intact, motor grossly intact   LABS: Basic Metabolic  Panel:  Recent Labs  01/21/14 1348 01/21/14 2245 01/22/14 0444  NA 141 144 145  K 3.9 3.9 3.9  CL 109 111 111  CO2 19 22 21   GLUCOSE 134* 199* 128*  BUN 22 24* 23  CREATININE 1.34 1.39* 1.28  CALCIUM 9.0 8.9 9.2  MG 1.8  --   --    Liver Function Tests:  Recent Labs  01/21/14 2245  AST 22  ALT 15  ALKPHOS 76  BILITOT 1.2  PROT 5.9*  ALBUMIN 3.2*   CBC:  Recent Labs  01/21/14 1348 01/21/14 2245  WBC 3.2* 2.7*  NEUTROABS  --  1.7  HGB 11.7* 11.3*  HCT 33.8* 33.2*  MCV 92.6 93.8  PLT 106* 84*   Cardiac Enzymes:  Recent Labs  01/21/14 2245 01/22/14 0444  TROPONINI <0.30 <0.30   Hemoglobin A1C:  Recent Labs  01/21/14 1348  HGBA1C 6.1*   Thyroid Function Tests:  Recent Labs  01/21/14 2245  TSH 4.640*     Radiology/Studies:  Dg Chest 2 View 01/21/2014   CLINICAL DATA:  80 year old male with shortness of breath and fatigue. Initial  encounter.  EXAM: CHEST  2 VIEW  COMPARISON:  01/16/2013 and earlier.  FINDINGS: Portable AP semi upright view at 1441 hrs. Stable cardiomegaly and left chest cardiac AICD. Sequelae of CABG. Mildly lower lung volumes. Allowing for this, the lungs are clear. No pneumothorax or edema. No acute osseous abnormality identified.  IMPRESSION: Low lung volumes, otherwise no acute cardiopulmonary abnormality.   Electronically Signed   By: Lars Pinks M.D.   On: 01/21/2014 14:56    DEVICE INTERROGATION: Device interrogated by industry.  Lead values including impedence, sensing, threshold within normal values.    Active Problems:   Acute systolic heart failure  complete heart block with ventricular pacing Rec: The patient needs BiV ICD upgrade to treat his acute on chronic systolic heart failure. He has dysynchronous activation of his ventricles with no AV synchrony which has exacerbated his heart failure. I have discussed the risks/benefits/goals/expectations of BiV ICD upgrade and he wishes to proceed.   Mikle Bosworth.D.

## 2014-01-22 NOTE — CV Procedure (Signed)
Electrophysiology Procedure Note  Procedure: removal of a previously implanted ICD, insertion of a new RA lead, insertion of a new BiV ICD utilizing a previously implanted epicardial LV lead with ICD pocket revision, left upper extremity venography, and Defibrillation threshold testing  Indication: Ischemic cardiomyopathy, chronic systolic heart failure, ejection fraction 20%, now with complete heart block, and class III heart failure symptoms, all the setting of a prior ICD implant and ventricular tachycardia  Description of the procedure: After informed consent was obtained, the patient was taken to the diagnostic electrophysiology laboratory in the fasting state. After the usual preparation and draping, intravenous Versed and fentanyl were utilized for sedation. 30 cc of lidocaine was infiltrated into the left infraclavicular region. Electrocautery was utilized to dissect down to the fascial plane. Initial attempts to puncture the left subclavian vein were unsuccessful. 10 cc of IV contrast was injected into the left upper extremity venous system, demonstrating that the vein was subtotally occluded, filling by collaterals. The vein was then punctured on the lateral border of the occlusion. A Glidewire was utilized to traverse the subtotal occlusion of the left subclavian vein. Multiple dilators were then used to allow for a Medtronic model 5076 active-fixation pacing lead, serial number [id number] to be advanced under fluoroscopic guidance into the right atrium. Mapping on the anterolateral wall of the right atrium demonstrated P waves of 3 mV. The lead was actively fixed. There was a large injury current. The pacing impedance was 526 ohms. The threshold was 0.6 V at 0.5 ms. The lead was secured to the fascial plane with silk suture, and the sewing sleeve was secured with silk suture. At this point additional electrocautery was utilized to dissect down and dissect free the old ICD. Additional dissection was  required and ICD pocket revision subsequently completed before the previously implanted epicardial left ventricular pacing leads could be dissected free. These were unipolar leads. They have been implanted 11 years ago. The left ventricular pacing threshold was 1.25 V at 0.5 ms. The pacing impedance was 300 ohms. With the satisfactory parameters, electrocautery was utilized to assure hemostasis, and the new atrial lead, the old defibrillator shocking coils, the old rate sense RV lead, and a left ventricular epicardial lead were secured to the new Medtronic biventricular ICD, serial [id number]. the pocket was irrigated with antibiotic irrigation, and the incision was closed with 2 layers of Vicryl suture. At this point I scrubbed out of the case to supervise defibrillation threshold testing.  After the patient was more deeply sedated under my direct supervision with additional intravenous Versed and fentanyl, ventricular fibrillation was induced with 50 Hz burst pacing. A 20 J shock was subsequently delivered after appropriate sensing of ventricular fibrillation, restoring sinus rhythm. No additional defibrillation threshold testing was carried out. Benzoin and Steri-Strips had previously been placed on the incision and a pressure dressing applied, and the patient was returned to his room in satisfactory condition.  Complications: There are no immediate procedure complications  Conclusion: Successful upgrade of a single chamber ICD to a biventricular ICD with insertion of a new right atrial pacing lead, connection of previously implanted epicardial left ventricular pacing lead, ICD pocket revision, defibrillation threshold testing, and left upper extremity venography.  Cristopher Peru, M.D.

## 2014-01-22 NOTE — H&P (Signed)
  ICD Criteria  Current LVEF:20% ;Obtained < 1 month ago.  NYHA Functional Classification: Class IV  Heart Failure History:  Yes, Duration of heart failure since onset is > 9 months  Non-Ischemic Dilated Cardiomyopathy History:  No.  Atrial Fibrillation/Atrial Flutter:  No.  Ventricular Tachycardia History:  Yes, Hemodynamic instability present, VT Type:  SVT - Monomorphic.  Cardiac Arrest History:  No  History of Syndromes with Risk of Sudden Death:  No.  Previous ICD:  Yes, ICD Type:  Single, Reason for ICD:  Secondary, reason for secondary prevention:  Ventricular Tachycardia  Electrophysiology Study: No.  Prior MI: Yes, Most recent MI timeframe is > 40 days.  PPM: No.  OSA:  No  Patient Life Expectancy of >=1 year: Yes.  Anticoagulation Therapy:  Patient is NOT on anticoagulation therapy.   Beta Blocker Therapy:  No, due to presentation with complete heart block  Ace Inhibitor/ARB Therapy:  Yes.

## 2014-01-22 NOTE — Interval H&P Note (Signed)
History and Physical Interval Note:  01/22/2014 1:00 PM  [name]  has presented today for surgery, with the diagnosis of heart failure  The various methods of treatment have been discussed with the patient and family. After consideration of risks, benefits and other options for treatment, the patient has consented to  Procedure(s): BI-VENTRICULAR IMPLANTABLE CARDIOVERTER DEFIBRILLATOR UPGRADE (N/A) as a surgical intervention .  The patient's history has been reviewed, patient examined, no change in status, stable for surgery.  I have reviewed the patient's chart and labs.  Questions were answered to the patient's satisfaction.     Evans Lance

## 2014-01-22 NOTE — ED Notes (Signed)
Pt's POA Ms.Izola Price took pt's can home with her

## 2014-01-22 NOTE — Progress Notes (Signed)
Subjective:  Appreciate EP consult and help. Patient states feels better after reprogramming the ICD. Patient is scheduled for CRT today. Patient denies any chest pain states breathing has improved  Objective:  Vital Signs in the last 24 hours: Temp:  [97.3 F (36.3 C)-97.9 F (36.6 C)] 97.5 F (36.4 C) (05/20 1237) Pulse Rate:  [36-148] 70 (05/20 1237) Resp:  [10-26] 21 (05/20 1237) BP: (120-166)/(54-106) 160/75 mmHg (05/20 1237) SpO2:  [90 %-100 %] 99 % (05/20 1237) Weight:  [69.627 kg (153 lb 8 oz)-69.9 kg (154 lb 1.6 oz)] 69.9 kg (154 lb 1.6 oz) (05/20 0243)  Intake/Output from previous day: 05/19 0701 - 05/20 0700 In: 3 [I.V.:3] Out: 250 [Urine:250] Intake/Output from this shift: Total I/O In: 363 [P.O.:360; I.V.:3] Out: 150 [Urine:150]  Physical Exam: Neck: no adenopathy, no carotid bruit, no JVD and supple, symmetrical, trachea midline Lungs: Decreased breath sound at bases Heart: regular rate and rhythm, S1, S2 normal and Of systolic murmur and S3 gallop noted Abdomen: soft, non-tender; bowel sounds normal; no masses,  no organomegaly Extremities: extremities normal, atraumatic, no cyanosis or edema  Lab Results:  Recent Labs  01/21/14 1348 01/21/14 2245  WBC 3.2* 2.7*  HGB 11.7* 11.3*  PLT 106* 84*    Recent Labs  01/21/14 2245 01/22/14 0444  NA 144 145  K 3.9 3.9  CL 111 111  CO2 22 21  GLUCOSE 199* 128*  BUN 24* 23  CREATININE 1.39* 1.28    Recent Labs  01/21/14 2245 01/22/14 0444  TROPONINI <0.30 <0.30   Hepatic Function Panel  Recent Labs  01/21/14 2245  PROT 5.9*  ALBUMIN 3.2*  AST 22  ALT 15  ALKPHOS 76  BILITOT 1.2   No results found for this basename: CHOL,  in the last 72 hours No results found for this basename: PROTIME,  in the last 72 hours  Imaging: Imaging results have been reviewed and Dg Chest 2 View  01/21/2014   CLINICAL DATA:  80 year old male with shortness of breath and fatigue. Initial encounter.  EXAM: CHEST   2 VIEW  COMPARISON:  01/16/2013 and earlier.  FINDINGS: Portable AP semi upright view at 1441 hrs. Stable cardiomegaly and left chest cardiac AICD. Sequelae of CABG. Mildly lower lung volumes. Allowing for this, the lungs are clear. No pneumothorax or edema. No acute osseous abnormality identified.  IMPRESSION: Low lung volumes, otherwise no acute cardiopulmonary abnormality.   Electronically Signed   By: Lars Pinks M.D.   On: 01/21/2014 14:56    Cardiac Studies:  Assessment/Plan:  Status post complete heart block Ischemic cardiomyopathy status post ICD in the past Coronary artery disease history of CABG in the past status post PCI to left circumflex in August of 2012 Resolving decompensated acute systolic heart failure Hypertension Diabetes mellitus Hypercholesteremia History of nonsustained VT in the past History of gouty arthritis Plan Continue present management We'll restart carvedilol once okay with EP Scheduled for CRT today. Will hold off on ischemic workup at this point and do as outpatient once he recovers from CRT unless he has worsening symptoms.  LOS: 1 day    Clent Demark 01/22/2014, 12:41 PM

## 2014-01-22 NOTE — Progress Notes (Signed)
  Echocardiogram 2D Echocardiogram has been performed.  Basilia Jumbo 01/22/2014, 9:54 AM

## 2014-01-23 ENCOUNTER — Encounter (HOSPITAL_COMMUNITY): Payer: Self-pay | Admitting: *Deleted

## 2014-01-23 ENCOUNTER — Inpatient Hospital Stay (HOSPITAL_COMMUNITY): Payer: Medicare Other

## 2014-01-23 LAB — BASIC METABOLIC PANEL
BUN: 18 mg/dL (ref 6–23)
CO2: 17 meq/L — AB (ref 19–32)
Calcium: 7.9 mg/dL — ABNORMAL LOW (ref 8.4–10.5)
Chloride: 115 mEq/L — ABNORMAL HIGH (ref 96–112)
Creatinine, Ser: 1.09 mg/dL (ref 0.50–1.35)
GFR calc Af Amer: 69 mL/min — ABNORMAL LOW (ref >90)
GFR calc non Af Amer: 60 mL/min — ABNORMAL LOW (ref >90)
Glucose, Bld: 112 mg/dL — ABNORMAL HIGH (ref 70–99)
Potassium: 3.4 mEq/L — ABNORMAL LOW (ref 3.7–5.3)
Sodium: 145 mEq/L (ref 137–147)

## 2014-01-23 LAB — GLUCOSE, CAPILLARY
GLUCOSE-CAPILLARY: 164 mg/dL — AB (ref 70–99)
GLUCOSE-CAPILLARY: 139 mg/dL — AB (ref 70–99)
GLUCOSE-CAPILLARY: 178 mg/dL — AB (ref 70–99)
Glucose-Capillary: 109 mg/dL — ABNORMAL HIGH (ref 70–99)

## 2014-01-23 MED ORDER — CARVEDILOL 3.125 MG PO TABS
3.1250 mg | ORAL_TABLET | Freq: Two times a day (BID) | ORAL | Status: DC
  Administered 2014-01-24 – 2014-01-27 (×7): 3.125 mg via ORAL
  Filled 2014-01-23 (×10): qty 1

## 2014-01-23 NOTE — Progress Notes (Signed)
ELECTROPHYSIOLOGY ROUNDING NOTE    Patient Name: [name] Date of Encounter: 01/23/2014    SUBJECTIVE:Patient without chest pain or shortness of breath this morning. Moderate incisional pain. Patient s/p CRTD upgrade yesterday with new atrial lead placed and previously implanted epicardial LV leads used.   TELEMETRY: Reviewed telemetry pt in AV pacing Filed Vitals:   01/23/14 0004 01/23/14 0415 01/23/14 0417 01/23/14 0817  BP: 139/64 135/66  158/56  Pulse: 71 57  65  Temp: 97.6 F (36.4 C) 97.5 F (36.4 C)  97.7 F (36.5 C)  TempSrc: Oral Oral  Oral  Resp: 22 21  12   Height:      Weight:   154 lb 15.7 oz (70.3 kg)   SpO2: 100% 98%  99%    Intake/Output Summary (Last 24 hours) at 01/23/14 1007 Last data filed at 01/23/14 0700  Gross per 24 hour  Intake    353 ml  Output    500 ml  Net   -147 ml    CURRENT MEDICATIONS: . allopurinol  300 mg Oral Daily  . aspirin EC  81 mg Oral Daily  . atorvastatin  20 mg Oral Daily  .  ceFAZolin (ANCEF) IV  2 g Intravenous Q6H  . docusate sodium  100 mg Oral BID  . famotidine  20 mg Oral BID  . insulin aspart  0-9 Units Subcutaneous TID WC  . nitroGLYCERIN  0.5 inch Topical 4 times per day  . ramipril  2.5 mg Oral BID  . sodium chloride  3 mL Intravenous Q12H  . spironolactone  25 mg Oral Daily  . sucralfate  1 g Oral TID AC & HS    LABS: Basic Metabolic Panel:  Recent Labs  01/21/14 1348  01/22/14 0444 01/23/14 0416  NA 141  < > 145 145  K 3.9  < > 3.9 3.4*  CL 109  < > 111 115*  CO2 19  < > 21 17*  GLUCOSE 134*  < > 128* 112*  BUN 22  < > 23 18  CREATININE 1.34  < > 1.28 1.09  CALCIUM 9.0  < > 9.2 7.9*  MG 1.8  --   --   --   < > = values in this interval not displayed. Liver Function Tests:  Recent Labs  01/21/14 2245  AST 22  ALT 15  ALKPHOS 76  BILITOT 1.2  PROT 5.9*  ALBUMIN 3.2*   CBC:  Recent Labs  01/21/14 1348 01/21/14 2245  WBC 3.2* 2.7*  NEUTROABS  --  1.7  HGB 11.7* 11.3*    HCT 33.8* 33.2*  MCV 92.6 93.8  PLT 106* 84*   Cardiac Enzymes:  Recent Labs  01/21/14 2245 01/22/14 0444 01/22/14 1225  TROPONINI <0.30 <0.30 <0.30   Hemoglobin A1C:  Recent Labs  01/21/14 1348  HGBA1C 6.1*   Thyroid Function Tests:  Recent Labs  01/21/14 2245  TSH 4.640*     Radiology/Studies:  Dg Chest 2 View 01/23/2014   CLINICAL DATA:  Status post defibrillator exchange  EXAM: CHEST  2 VIEW  COMPARISON:  01/21/2014  FINDINGS: A new defibrillator is noted. A an additional lead in the right atrium is now seen. The cardiac shadow is otherwise stable and mildly enlarged. Postsurgical changes consistent with bypass grafting are noted. The lungs are well aerated bilaterally. No focal infiltrate or sizable pneumothorax is noted.  IMPRESSION: Status post defibrillator exchange.  No acute abnormality is noted.   Electronically Signed  By: Inez Catalina M.D.   On: 01/23/2014 07:55   PHYSICAL EXAM Well appearing elderly man, NAD HEENT: Unremarkable,[place], AT Neck:  6 JVD, no thyromegally Back:  No CVA tenderness Lungs:  Clear with no wheezes, rales, or rhonchi, minimal hematoma. HEART:  Regular rate rhythm, no murmurs, no rubs, no clicks Abd:  soft, positive bowel sounds, no organomegally, no rebound, no guarding Ext:  2 plus pulses, no edema, no cyanosis, no clubbing Skin:  No rashes no nodules Neuro:  CN II through XII intact, motor grossly intact   DEVICE INTERROGATION: Device interrogated by industry.  Lead values including impedence, sensing, threshold within normal values.    Active Problems:   Acute systolic heart failure  Complete heart block, contributing to #1 Chronic systolic heart failure S/P BiV PM upgrade with insertion of a new atrial lead and connection of a previously implanted epicardial LV lead Rec: He is stable post op. Ok for discharge tomorrow from my perspective. Device is working normally. I will schedule followup for an incision check and  reprogramming in two weeks and 3 months respectively.  Gregg Taylor,M.D.  Wound check appointment scheduled for June 3rd at 9:30AM.  Wound care, restrictions reviewed with patient.    EP to see as needed while here. Please call with questions.

## 2014-01-23 NOTE — Clinical Documentation Improvement (Signed)
Patient with abnormal CBC:   WBC = 2.7  H&H = 11.3 / 33.2  Platelets = 84                                  Please provide a diagnosis associated with the above data to enhance the Severity of Illness and Risk of Mortality scores.    Thank You, Zoila Shutter ,RN Clinical Documentation Specialist:  [facility] [employer] [facility]

## 2014-01-23 NOTE — Progress Notes (Signed)
Subjective:  Patient complains of soreness her surgical site. States feels tired and weak. Denies any chest pain or shortness of breath  Objective:  Vital Signs in the last 24 hours: Temp:  [97 F (36.1 C)-97.7 F (36.5 C)] 97.3 F (36.3 C) (05/21 1210) Pulse Rate:  [26-71] 66 (05/21 1210) Resp:  [11-22] 12 (05/21 1210) BP: (135-175)/(56-83) 135/72 mmHg (05/21 1210) SpO2:  [94 %-100 %] 99 % (05/21 1210) Weight:  [70.3 kg (154 lb 15.7 oz)] 70.3 kg (154 lb 15.7 oz) (05/21 0417)  Intake/Output from previous day: 05/20 0701 - 05/21 0700 In: 713 [P.O.:490; I.V.:123; IV Piggyback:100] Out: 650 [Urine:650] Intake/Output from this shift:    Physical Exam: Neck: no adenopathy, no carotid bruit, no JVD, supple, symmetrical, trachea midline and Surgical dressing dry  Lungs: Decreased breath sound at bases Heart: regular rate and rhythm, S1, S2 normal and No systolic murmur and S3 gallop noted Abdomen: soft, non-tender; bowel sounds normal; no masses,  no organomegaly Extremities: extremities normal, atraumatic, no cyanosis or edema  Lab Results:  Recent Labs  01/21/14 1348 01/21/14 2245  WBC 3.2* 2.7*  HGB 11.7* 11.3*  PLT 106* 84*    Recent Labs  01/22/14 0444 01/23/14 0416  NA 145 145  K 3.9 3.4*  CL 111 115*  CO2 21 17*  GLUCOSE 128* 112*  BUN 23 18  CREATININE 1.28 1.09    Recent Labs  01/22/14 0444 01/22/14 1225  TROPONINI <0.30 <0.30   Hepatic Function Panel  Recent Labs  01/21/14 2245  PROT 5.9*  ALBUMIN 3.2*  AST 22  ALT 15  ALKPHOS 76  BILITOT 1.2   No results found for this basename: CHOL,  in the last 72 hours No results found for this basename: PROTIME,  in the last 72 hours  Imaging: Imaging results have been reviewed and Dg Chest 2 View  01/23/2014   CLINICAL DATA:  Status post defibrillator exchange  EXAM: CHEST  2 VIEW  COMPARISON:  01/21/2014  FINDINGS: A new defibrillator is noted. A an additional lead in the right atrium is now seen.  The cardiac shadow is otherwise stable and mildly enlarged. Postsurgical changes consistent with bypass grafting are noted. The lungs are well aerated bilaterally. No focal infiltrate or sizable pneumothorax is noted.  IMPRESSION: Status post defibrillator exchange.  No acute abnormality is noted.   Electronically Signed   By: Inez Catalina M.D.   On: 01/23/2014 07:55    Cardiac Studies:  Assessment/Plan:  Status post complete heart block  Ischemic cardiomyopathy status post ICD in the past and now status post CRT D. upgrade Coronary artery disease history of CABG in the past status post PCI to left circumflex in August of 2012  Resolving decompensated acute systolic heart failure  Hypertension  Diabetes mellitus  Hypercholesteremia  History of nonsustained VT in the past  History of gouty arthritis Plan Continue present management Dr. Doylene Canard on-call for me for tomorrow Okay to discharge home if stable in a.m. Restart carvedilol as per orders  LOS: 2 days    Clent Demark 01/23/2014, 12:17 PM

## 2014-01-23 NOTE — Clinical Documentation Improvement (Signed)
Patient with abnormal labs:   Creatinine's dropped from 1.39 on day of admit to 1.09 this morning. This is a 0.30 drop over 2 days.  Please provide diagnosis associated with above resolving elevated creatinine levels:    Acute Renal Failure/Acute Kidney Injury Acute on Chronic Renal Failure Chronic Renal Failure - please include stage if indicated Other Condition  Thank You, Zoila Shutter ,RN Clinical Documentation Specialist:  [facility] [employer] [facility]

## 2014-01-24 LAB — BASIC METABOLIC PANEL
BUN: 17 mg/dL (ref 6–23)
CALCIUM: 8.6 mg/dL (ref 8.4–10.5)
CO2: 20 mEq/L (ref 19–32)
Chloride: 110 mEq/L (ref 96–112)
Creatinine, Ser: 1.19 mg/dL (ref 0.50–1.35)
GFR calc non Af Amer: 54 mL/min — ABNORMAL LOW (ref >90)
GFR, EST AFRICAN AMERICAN: 62 mL/min — AB (ref >90)
Glucose, Bld: 116 mg/dL — ABNORMAL HIGH (ref 70–99)
Potassium: 3.5 mEq/L — ABNORMAL LOW (ref 3.7–5.3)
Sodium: 142 mEq/L (ref 137–147)

## 2014-01-24 LAB — GLUCOSE, CAPILLARY
GLUCOSE-CAPILLARY: 147 mg/dL — AB (ref 70–99)
GLUCOSE-CAPILLARY: 145 mg/dL — AB (ref 70–99)
Glucose-Capillary: 104 mg/dL — ABNORMAL HIGH (ref 70–99)
Glucose-Capillary: 144 mg/dL — ABNORMAL HIGH (ref 70–99)

## 2014-01-24 MED ORDER — POTASSIUM CHLORIDE CRYS ER 10 MEQ PO TBCR
10.0000 meq | EXTENDED_RELEASE_TABLET | Freq: Two times a day (BID) | ORAL | Status: DC
  Administered 2014-01-24 (×2): 10 meq via ORAL
  Filled 2014-01-24 (×4): qty 1

## 2014-01-24 NOTE — Evaluation (Signed)
Physical Therapy Evaluation Patient Details Name: [name] MRN: [id number] DOB: 01-07-1927 Today's Date: 01/24/2014   History of Present Illness  Pt is an 80 year old male with past medical history significant for CAD, history of inferior wall myocardial infarction in 1994, s/p CABG, sequential saphenous vein graft to OM1 and OM 2 saphenous vein graft to PDA saphenous vein graft to high diagonal/ramus, CHF, ischemic cardiomyopathy, status post ICD, HTN, status post PCI to left circumflex in August of 2012, history of nonsustained VT in the past, DM, gouty arthritis, and admitted 5/19 for increased SOB and fatigue. Pt s/p upgrade from a DDD PM to a BiV PPM on 5/20.  Clinical Impression  Pt currently with functional limitations due to the deficits listed below (see PT Problem List). Pt will benefit from skilled PT to increase their independence and safety with mobility to allow discharge to the venue listed below.  Pt ambulated in hallway with 1 HHA as pt typically uses SPC at home.  Discussed using SPC on R side due to PPM on L side and pt reports understanding.  Pt reports no hx of falls in the past 3 years.  Pt reports he will ask MD on activity guidelines as he hopes to continue exercising/leisure activities if possible (mentioned exercises and golf).  Will continue to see in acute care to assist with mobilizing; pt active with hospice per pt and chart review, states he has DME needs.     Follow Up Recommendations Supervision for mobility/OOB;No PT follow up (active with hospice)    Equipment Recommendations  None recommended by PT    Recommendations for Other Services       Precautions / Restrictions Precautions Precaution Comments: restrict WB and motion of L UE per RN      Mobility  Bed Mobility               General bed mobility comments: pt up in recliner on arrival  Transfers Overall transfer level: Needs assistance Equipment used: None Transfers: Sit to/from  Stand Sit to Stand: Min guard         General transfer comment: verbal cues for limiting use of L UE, used R UE on armrest to assist rise  Ambulation/Gait Ambulation/Gait assistance: Min guard Ambulation Distance (Feet): 200 Feet Assistive device: 1 person hand held assist Gait Pattern/deviations: Narrow base of support;Decreased stride length Gait velocity: decr   General Gait Details: provided 1 HHA as pt reports using cane at home, discussed using cane in R hand due to PPM on L side, pt reports feeling good ambulating  Stairs            Wheelchair Mobility    Modified Rankin (Stroke Patients Only)       Balance                                             Pertinent Vitals/Pain No c/o pain HR variable during rest and ambulation 67-130 bpm per monitor, pt reports no symptoms    Home Living Family/patient expects to be discharged to:: Private residence Living Arrangements: Alone Available Help at Discharge: Friend(s);Available PRN/intermittently Type of Home: House       Home Layout: One level Home Equipment: Cane - single point;Walker - 2 wheels      Prior Function Level of Independence: Independent with assistive device(s)  Hand Dominance        Extremity/Trunk Assessment               Lower Extremity Assessment: Generalized weakness         Communication   Communication: No difficulties  Cognition Arousal/Alertness: Awake/alert Behavior During Therapy: WFL for tasks assessed/performed Overall Cognitive Status: Within Functional Limits for tasks assessed                      General Comments      Exercises        Assessment/Plan    PT Assessment Patient needs continued PT services  PT Diagnosis Difficulty walking   PT Problem List Decreased strength;Decreased activity tolerance;Decreased mobility;Decreased knowledge of use of DME;Decreased knowledge of precautions  PT Treatment  Interventions DME instruction;Gait training;Functional mobility training;Therapeutic activities;Therapeutic exercise;Patient/family education   PT Goals (Current goals can be found in the Care Plan section) Acute Rehab PT Goals PT Goal Formulation: With patient Time For Goal Achievement: 01/31/14 Potential to Achieve Goals: Good    Frequency Min 3X/week   Barriers to discharge        Co-evaluation               End of Session   Activity Tolerance: Patient tolerated treatment well Patient left: in chair;with call bell/phone within reach Nurse Communication: Mobility status         Time: 1191-4782 PT Time Calculation (min): 15 min   Charges:   PT Evaluation $Initial PT Evaluation Tier I: 1 Procedure PT Treatments $Gait Training: 8-22 mins   PT G CodesJunius Argyle 01/24/2014, 3:06 PM Carmelia Bake, PT, DPT 01/24/2014 Pager: [phone]

## 2014-01-24 NOTE — Progress Notes (Signed)
Ref: [name], MD   Subjective:  Worried about pacemaker side soreness. Not feeling well to go home (Home alone). Afebrile. Monitor shows on demand dual lead pacing.  Objective:  Vital Signs in the last 24 hours: Temp:  [97.3 F (36.3 C)-98.5 F (36.9 C)] 97.7 F (36.5 C) (05/22 0721) Pulse Rate:  [62-75] 68 (05/22 0721) Cardiac Rhythm:  [-] A-V Sequential paced (05/22 0721) Resp:  [12-22] 22 (05/22 0721) BP: (116-154)/(54-72) 116/72 mmHg (05/22 0721) SpO2:  [97 %-99 %] 98 % (05/22 0721) Weight:  [71.2 kg (156 lb 15.5 oz)] 71.2 kg (156 lb 15.5 oz) (05/22 0500)  Physical Exam: BP Readings from Last 1 Encounters:  01/24/14 116/72    Wt Readings from Last 1 Encounters:  01/24/14 71.2 kg (156 lb 15.5 oz)    Weight change: 0.9 kg (1 lb 15.8 oz)  HEENT: [place]/AT, Eyes-Brown, PERL, EOMI, Conjunctiva-Pink, Sclera-Non-icteric Neck: No JVD, No bruit, Trachea midline. Lungs:  Clear, Bilateral. Left pectoral surgical dressing with dried blood. Cardiac:  Regular rhythm, normal S1 and S2, no S3. III/VI systolic and II/VI diastolic murmur. Abdomen:  Soft, non-tender. Extremities:  No edema present. No cyanosis. No clubbing. CNS: AxOx3, Cranial nerves grossly intact, moves all 4 extremities. Right handed. Skin: Warm and dry.   Intake/Output from previous day: 05/21 0701 - 05/22 0700 In: 690 [P.O.:690] Out: 925 [Urine:925]    Lab Results: BMET    Component Value Date/Time   NA 142 01/24/2014 0309   NA 145 01/23/2014 0416   NA 145 01/22/2014 0444   K 3.5* 01/24/2014 0309   K 3.4* 01/23/2014 0416   K 3.9 01/22/2014 0444   CL 110 01/24/2014 0309   CL 115* 01/23/2014 0416   CL 111 01/22/2014 0444   CO2 20 01/24/2014 0309   CO2 17* 01/23/2014 0416   CO2 21 01/22/2014 0444   GLUCOSE 116* 01/24/2014 0309   GLUCOSE 112* 01/23/2014 0416   GLUCOSE 128* 01/22/2014 0444   BUN 17 01/24/2014 0309   BUN 18 01/23/2014 0416   BUN 23 01/22/2014 0444   CREATININE 1.19 01/24/2014 0309   CREATININE 1.09  01/23/2014 0416   CREATININE 1.28 01/22/2014 0444   CALCIUM 8.6 01/24/2014 0309   CALCIUM 7.9* 01/23/2014 0416   CALCIUM 9.2 01/22/2014 0444   GFRNONAA 54* 01/24/2014 0309   GFRNONAA 60* 01/23/2014 0416   GFRNONAA 49* 01/22/2014 0444   GFRAA 62* 01/24/2014 0309   GFRAA 69* 01/23/2014 0416   GFRAA 57* 01/22/2014 0444   CBC    Component Value Date/Time   WBC 2.7* 01/21/2014 2245   RBC 3.54* 01/21/2014 2245   HGB 11.3* 01/21/2014 2245   HCT 33.2* 01/21/2014 2245   PLT 84* 01/21/2014 2245   MCV 93.8 01/21/2014 2245   MCH 31.9 01/21/2014 2245   MCHC 34.0 01/21/2014 2245   RDW 14.9 01/21/2014 2245   LYMPHSABS 0.6* 01/21/2014 2245   MONOABS 0.3 01/21/2014 2245   EOSABS 0.1 01/21/2014 2245   BASOSABS 0.0 01/21/2014 2245   HEPATIC Function Panel  Recent Labs  01/07/14 2107 01/21/14 2245  PROT 6.6 5.9*   HEMOGLOBIN A1C No components found with this basename: HGA1C,  MPG   CARDIAC ENZYMES Lab Results  Component Value Date   CKTOTAL 66 05/20/2011   CKMB 2.9 05/19/2011   TROPONINI <0.30 01/22/2014   TROPONINI <0.30 01/22/2014   TROPONINI <0.30 01/21/2014   BNP  Recent Labs  01/21/14 1348 01/21/14 2245  PROBNP 3731.0* 3379.0*   TSH  Recent  Labs  01/21/14 2245  TSH 4.640*   CHOLESTEROL No results found for this basename: CHOL,  in the last 8760 hours  Scheduled Meds: . allopurinol  300 mg Oral Daily  . aspirin EC  81 mg Oral Daily  . atorvastatin  20 mg Oral Daily  . carvedilol  3.125 mg Oral BID WC  . docusate sodium  100 mg Oral BID  . famotidine  20 mg Oral BID  . insulin aspart  0-9 Units Subcutaneous TID WC  . nitroGLYCERIN  0.5 inch Topical 4 times per day  . potassium chloride  10 mEq Oral BID  . ramipril  2.5 mg Oral BID  . sodium chloride  3 mL Intravenous Q12H  . spironolactone  25 mg Oral Daily  . sucralfate  1 g Oral TID AC & HS   Continuous Infusions:  PRN Meds:.sodium chloride, acetaminophen, nitroGLYCERIN, ondansetron (ZOFRAN) IV, sodium  chloride  Assessment/Plan: Status post complete heart block  Ischemic cardiomyopathy  Status post ICD in the past and now status post CRT D. upgrade  Coronary artery disease  History of CABG in the past status post PCI to left circumflex in August of 2012  Resolving decompensated acute systolic heart failure  Hypertension  Diabetes mellitus  Hypercholesteremia  History of nonsustained VT History of gouty arthritis  Transfer to telemetry bed.   LOS: 3 days    Dixie Dials  MD  01/24/2014, 9:21 AM

## 2014-01-25 ENCOUNTER — Encounter (HOSPITAL_COMMUNITY): Payer: Self-pay | Admitting: *Deleted

## 2014-01-25 LAB — GLUCOSE, CAPILLARY
GLUCOSE-CAPILLARY: 113 mg/dL — AB (ref 70–99)
GLUCOSE-CAPILLARY: 118 mg/dL — AB (ref 70–99)
GLUCOSE-CAPILLARY: 140 mg/dL — AB (ref 70–99)
Glucose-Capillary: 164 mg/dL — ABNORMAL HIGH (ref 70–99)

## 2014-01-25 MED ORDER — POTASSIUM CHLORIDE CRYS ER 20 MEQ PO TBCR
20.0000 meq | EXTENDED_RELEASE_TABLET | Freq: Once | ORAL | Status: AC
  Administered 2014-01-25: 20 meq via ORAL
  Filled 2014-01-25: qty 1

## 2014-01-25 MED ORDER — TRAMADOL HCL 50 MG PO TABS
50.0000 mg | ORAL_TABLET | Freq: Two times a day (BID) | ORAL | Status: DC | PRN
  Administered 2014-01-25 – 2014-01-26 (×2): 50 mg via ORAL
  Filled 2014-01-25 (×2): qty 1

## 2014-01-25 NOTE — Progress Notes (Signed)
Ref: [name], MD   Subjective:  Feeling better but limited activity. Afebrile.  Objective:  Vital Signs in the last 24 hours: Temp:  [97.2 F (36.2 C)-98.2 F (36.8 C)] 97.6 F (36.4 C) (05/23 0439) Pulse Rate:  [61-65] 65 (05/23 0439) Cardiac Rhythm:  [-] A-V Sequential paced (05/23 0750) Resp:  [18-24] 20 (05/23 0439) BP: (116-142)/(60-79) 125/68 mmHg (05/23 0439) SpO2:  [95 %-99 %] 95 % (05/23 0439) Weight:  [69.9 kg (154 lb 1.6 oz)-70.5 kg (155 lb 6.8 oz)] 69.9 kg (154 lb 1.6 oz) (05/23 0439)  Physical Exam: BP Readings from Last 1 Encounters:  01/25/14 125/68    Wt Readings from Last 1 Encounters:  01/25/14 69.9 kg (154 lb 1.6 oz)    Weight change: -0.7 kg (-1 lb 8.7 oz)  HEENT: [place]/AT, Eyes-Brown, PERL, EOMI, Conjunctiva-Pink, Sclera-Non-icteric Neck: No JVD, No bruit, Trachea midline. Lungs:  Clear, Bilateral.Left pectoral area dressing. Cardiac:  Regular rhythm, normal S1 and S2, no S3. III/VI systolic murmur and II/VI diastolic murmur. Abdomen:  Soft, non-tender. Extremities:  No edema present. No cyanosis. No clubbing. CNS: AxOx3, Cranial nerves grossly intact, moves all 4 extremities. Right handed. Skin: Warm and dry.   Intake/Output from previous day: 05/22 0701 - 05/23 0700 In: 980 [P.O.:980] Out: 1547 [Urine:1547]    Lab Results: BMET    Component Value Date/Time   NA 142 01/24/2014 0309   NA 145 01/23/2014 0416   NA 145 01/22/2014 0444   K 3.5* 01/24/2014 0309   K 3.4* 01/23/2014 0416   K 3.9 01/22/2014 0444   CL 110 01/24/2014 0309   CL 115* 01/23/2014 0416   CL 111 01/22/2014 0444   CO2 20 01/24/2014 0309   CO2 17* 01/23/2014 0416   CO2 21 01/22/2014 0444   GLUCOSE 116* 01/24/2014 0309   GLUCOSE 112* 01/23/2014 0416   GLUCOSE 128* 01/22/2014 0444   BUN 17 01/24/2014 0309   BUN 18 01/23/2014 0416   BUN 23 01/22/2014 0444   CREATININE 1.19 01/24/2014 0309   CREATININE 1.09 01/23/2014 0416   CREATININE 1.28 01/22/2014 0444   CALCIUM 8.6 01/24/2014  0309   CALCIUM 7.9* 01/23/2014 0416   CALCIUM 9.2 01/22/2014 0444   GFRNONAA 54* 01/24/2014 0309   GFRNONAA 60* 01/23/2014 0416   GFRNONAA 49* 01/22/2014 0444   GFRAA 62* 01/24/2014 0309   GFRAA 69* 01/23/2014 0416   GFRAA 57* 01/22/2014 0444   CBC    Component Value Date/Time   WBC 2.7* 01/21/2014 2245   RBC 3.54* 01/21/2014 2245   HGB 11.3* 01/21/2014 2245   HCT 33.2* 01/21/2014 2245   PLT 84* 01/21/2014 2245   MCV 93.8 01/21/2014 2245   MCH 31.9 01/21/2014 2245   MCHC 34.0 01/21/2014 2245   RDW 14.9 01/21/2014 2245   LYMPHSABS 0.6* 01/21/2014 2245   MONOABS 0.3 01/21/2014 2245   EOSABS 0.1 01/21/2014 2245   BASOSABS 0.0 01/21/2014 2245   HEPATIC Function Panel  Recent Labs  01/07/14 2107 01/21/14 2245  PROT 6.6 5.9*   HEMOGLOBIN A1C No components found with this basename: HGA1C,  MPG   CARDIAC ENZYMES Lab Results  Component Value Date   CKTOTAL 66 05/20/2011   CKMB 2.9 05/19/2011   TROPONINI <0.30 01/22/2014   TROPONINI <0.30 01/22/2014   TROPONINI <0.30 01/21/2014   BNP  Recent Labs  01/21/14 1348 01/21/14 2245  PROBNP 3731.0* 3379.0*   TSH  Recent Labs  01/21/14 2245  TSH 4.640*   CHOLESTEROL No results found  for this basename: CHOL,  in the last 8760 hours  Scheduled Meds: . allopurinol  300 mg Oral Daily  . aspirin EC  81 mg Oral Daily  . atorvastatin  20 mg Oral Daily  . carvedilol  3.125 mg Oral BID WC  . docusate sodium  100 mg Oral BID  . famotidine  20 mg Oral BID  . insulin aspart  0-9 Units Subcutaneous TID WC  . nitroGLYCERIN  0.5 inch Topical 4 times per day  . ramipril  2.5 mg Oral BID  . sodium chloride  3 mL Intravenous Q12H  . spironolactone  25 mg Oral Daily  . sucralfate  1 g Oral TID AC & HS   Continuous Infusions:  PRN Meds:.sodium chloride, nitroGLYCERIN, sodium chloride  Assessment/Plan: Status post complete heart block  Ischemic cardiomyopathy  Status post ICD in the past and now status post CRT D. upgrade  Coronary artery disease   History of CABG in the past status post PCI to left circumflex in August of 2012  Resolving decompensated acute systolic heart failure  Hypertension  Diabetes mellitus  Hypercholesteremia  History of nonsustained VT  History of gouty arthritis Weakness  Increase activity. Home soon.     LOS: 4 days    Dixie Dials  MD  01/25/2014, 8:25 AM

## 2014-01-25 NOTE — Progress Notes (Addendum)
Pt ambulated hallway with walker without complaints of weakness or SOB. Pt left arm is swollen compared right, will continue to monitor

## 2014-01-25 NOTE — Progress Notes (Signed)
Pt A&O x4, pt states pain in Lt shoulder improving. Will continue to  Monitor

## 2014-01-25 NOTE — Plan of Care (Signed)
Problem: Phase I Progression Outcomes Goal: EF % per last Echo/documented,Core Reminder form on chart Outcome: Completed/Met Date Met:  01/25/14 EF is 20-25% as of 01/22/2014

## 2014-01-26 LAB — GLUCOSE, CAPILLARY
Glucose-Capillary: 121 mg/dL — ABNORMAL HIGH (ref 70–99)
Glucose-Capillary: 137 mg/dL — ABNORMAL HIGH (ref 70–99)
Glucose-Capillary: 143 mg/dL — ABNORMAL HIGH (ref 70–99)
Glucose-Capillary: 132 mg/dL — ABNORMAL HIGH (ref 70–99)

## 2014-01-26 MED ORDER — POTASSIUM CHLORIDE CRYS ER 10 MEQ PO TBCR
10.0000 meq | EXTENDED_RELEASE_TABLET | Freq: Two times a day (BID) | ORAL | Status: DC
Start: 2014-01-26 — End: 2014-01-27
  Administered 2014-01-26 – 2014-01-27 (×3): 10 meq via ORAL
  Filled 2014-01-26 (×4): qty 1

## 2014-01-26 NOTE — Progress Notes (Signed)
Ref: [name], MD  Subjective:  Feeling better but weakness and PT need per nurse.   Objective:  Vital Signs in the last 24 hours: Temp:  [97.2 F (36.2 C)-98 F (36.7 C)] 97.7 F (36.5 C) (05/24 0512) Pulse Rate:  [58-74] 67 (05/24 0615) Cardiac Rhythm:  [-] A-V Sequential paced (05/23 2030) Resp:  [15-20] 18 (05/24 0512) BP: (112-136)/(54-73) 115/54 mmHg (05/24 0512) SpO2:  [97 %-98 %] 98 % (05/24 0512) Weight:  [69.99 kg (154 lb 4.8 oz)] 69.99 kg (154 lb 4.8 oz) (05/24 0512)  Physical Exam: BP Readings from Last 1 Encounters:  01/26/14 115/54    Wt Readings from Last 1 Encounters:  01/26/14 69.99 kg (154 lb 4.8 oz)    Weight change: -0.51 kg (-1 lb 2 oz)  HEENT: [place]/AT, Eyes-Brown, PERL, EOMI, Conjunctiva-Pink, Sclera-Non-icteric Neck: No JVD, No bruit, Trachea midline. Lungs:  Clear, Bilateral. Left pectoral dressing unchanged. Cardiac:  Regular rhythm, normal S1 and S2, no S3. III/VI systolic murmur and II/VI diastolic murmur. Abdomen:  Soft, non-tender. Extremities:  No edema present. No cyanosis. No clubbing. CNS: AxOx3, Cranial nerves grossly intact, moves all 4 extremities. Right handed. Skin: Warm and dry.   Intake/Output from previous day: 05/23 0701 - 05/24 0700 In: 1523 [P.O.:1520; I.V.:3] Out: 1425 [Urine:1425]    Lab Results: BMET    Component Value Date/Time   NA 142 01/24/2014 0309   NA 145 01/23/2014 0416   NA 145 01/22/2014 0444   K 3.5* 01/24/2014 0309   K 3.4* 01/23/2014 0416   K 3.9 01/22/2014 0444   CL 110 01/24/2014 0309   CL 115* 01/23/2014 0416   CL 111 01/22/2014 0444   CO2 20 01/24/2014 0309   CO2 17* 01/23/2014 0416   CO2 21 01/22/2014 0444   GLUCOSE 116* 01/24/2014 0309   GLUCOSE 112* 01/23/2014 0416   GLUCOSE 128* 01/22/2014 0444   BUN 17 01/24/2014 0309   BUN 18 01/23/2014 0416   BUN 23 01/22/2014 0444   CREATININE 1.19 01/24/2014 0309   CREATININE 1.09 01/23/2014 0416   CREATININE 1.28 01/22/2014 0444   CALCIUM 8.6 01/24/2014 0309   CALCIUM 7.9* 01/23/2014 0416   CALCIUM 9.2 01/22/2014 0444   GFRNONAA 54* 01/24/2014 0309   GFRNONAA 60* 01/23/2014 0416   GFRNONAA 49* 01/22/2014 0444   GFRAA 62* 01/24/2014 0309   GFRAA 69* 01/23/2014 0416   GFRAA 57* 01/22/2014 0444   CBC    Component Value Date/Time   WBC 2.7* 01/21/2014 2245   RBC 3.54* 01/21/2014 2245   HGB 11.3* 01/21/2014 2245   HCT 33.2* 01/21/2014 2245   PLT 84* 01/21/2014 2245   MCV 93.8 01/21/2014 2245   MCH 31.9 01/21/2014 2245   MCHC 34.0 01/21/2014 2245   RDW 14.9 01/21/2014 2245   LYMPHSABS 0.6* 01/21/2014 2245   MONOABS 0.3 01/21/2014 2245   EOSABS 0.1 01/21/2014 2245   BASOSABS 0.0 01/21/2014 2245   HEPATIC Function Panel  Recent Labs  01/07/14 2107 01/21/14 2245  PROT 6.6 5.9*   HEMOGLOBIN A1C No components found with this basename: HGA1C,  MPG   CARDIAC ENZYMES Lab Results  Component Value Date   CKTOTAL 66 05/20/2011   CKMB 2.9 05/19/2011   TROPONINI <0.30 01/22/2014   TROPONINI <0.30 01/22/2014   TROPONINI <0.30 01/21/2014   BNP  Recent Labs  01/21/14 1348 01/21/14 2245  PROBNP 3731.0* 3379.0*   TSH  Recent Labs  01/21/14 2245  TSH 4.640*   CHOLESTEROL No results found for  this basename: CHOL,  in the last 8760 hours  Scheduled Meds: . allopurinol  300 mg Oral Daily  . aspirin EC  81 mg Oral Daily  . atorvastatin  20 mg Oral Daily  . carvedilol  3.125 mg Oral BID WC  . docusate sodium  100 mg Oral BID  . famotidine  20 mg Oral BID  . insulin aspart  0-9 Units Subcutaneous TID WC  . nitroGLYCERIN  0.5 inch Topical 4 times per day  . ramipril  2.5 mg Oral BID  . sodium chloride  3 mL Intravenous Q12H  . spironolactone  25 mg Oral Daily  . sucralfate  1 g Oral TID AC & HS   Continuous Infusions:  PRN Meds:.sodium chloride, nitroGLYCERIN, sodium chloride, traMADol  Assessment/Plan: Status post complete heart block  Ischemic cardiomyopathy  Status post ICD in the past and now status post CRT D. upgrade  Coronary artery  disease  History of CABG in the past status post PCI to left circumflex in August of 2012  Resolving decompensated acute systolic heart failure  Hypertension  Diabetes mellitus  Hypercholesteremia  History of nonsustained VT  History of gouty arthritis  Weakness   Resume PT here and then at home.   LOS: 5 days    Dixie Dials  MD  01/26/2014, 8:55 AM

## 2014-01-26 NOTE — Progress Notes (Signed)
Patient alert and oriented x4 throughout shift.  Vital signs stable.  Per MD, patient will discharge tomorrow after PT consult and recommendations.  Patient denies any questions or concerns at this time.  Will continue to monitor.

## 2014-01-27 LAB — BASIC METABOLIC PANEL
BUN: 21 mg/dL (ref 6–23)
CO2: 24 mEq/L (ref 19–32)
Calcium: 9 mg/dL (ref 8.4–10.5)
Chloride: 107 mEq/L (ref 96–112)
Creatinine, Ser: 1.25 mg/dL (ref 0.50–1.35)
GFR calc non Af Amer: 51 mL/min — ABNORMAL LOW (ref >90)
GFR, EST AFRICAN AMERICAN: 59 mL/min — AB (ref >90)
Glucose, Bld: 127 mg/dL — ABNORMAL HIGH (ref 70–99)
POTASSIUM: 4.4 meq/L (ref 3.7–5.3)
Sodium: 138 mEq/L (ref 137–147)

## 2014-01-27 LAB — GLUCOSE, CAPILLARY
Glucose-Capillary: 130 mg/dL — ABNORMAL HIGH (ref 70–99)
Glucose-Capillary: 103 mg/dL — ABNORMAL HIGH (ref 70–99)

## 2014-01-27 NOTE — Progress Notes (Signed)
Physical Therapy Treatment Patient Details Name: [name] MRN: [id number] DOB: 1927/06/06 Today's Date: 01/27/2014    History of Present Illness Pt is an 80 year old male with past medical history significant for CAD, history of inferior wall myocardial infarction in 1994, s/p CABG, sequential saphenous vein graft to OM1 and OM 2 saphenous vein graft to PDA saphenous vein graft to high diagonal/ramus, CHF, ischemic cardiomyopathy, status post ICD, HTN, status post PCI to left circumflex in August of 2012, history of nonsustained VT in the past, DM, gouty arthritis, and admitted 5/19 for increased SOB and fatigue. Pt s/p upgrade from a DDD PM to a BiV PPM on 5/20.    PT Comments    Pt progressing towards physical therapy goals. Pt anticipates d/c home today, and states he is to follow-up with hospice care upon return home.   Follow Up Recommendations  Supervision for mobility/OOB;No PT follow up     Equipment Recommendations  None recommended by PT    Recommendations for Other Services       Precautions / Restrictions Precautions Precautions: Fall Precaution Comments: restrict WB and motion of L UE per RN Restrictions Weight Bearing Restrictions: No    Mobility  Bed Mobility               General bed mobility comments: Pt received walking around room when PT arrived  Transfers Overall transfer level: Needs assistance Equipment used: None Transfers: Sit to/from Stand Sit to Stand: Min guard         General transfer comment: VC's for hand placement and safety awareness during transfers  Ambulation/Gait Ambulation/Gait assistance: Supervision Ambulation Distance (Feet): 200 Feet Assistive device: Rolling walker (2 wheeled) Gait Pattern/deviations: Step-through pattern;Decreased stride length;Trunk flexed Gait velocity: Decreased Gait velocity interpretation: Below normal speed for age/gender General Gait Details: Pt states he feels comfortable using the RW  when ambulating outside the room. Pt did not require any physical assist however VC's for sequencing and walker placement.    Stairs            Wheelchair Mobility    Modified Rankin (Stroke Patients Only)       Balance Overall balance assessment: Needs assistance Sitting-balance support: Feet supported;No upper extremity supported Sitting balance-Leahy Scale: Good     Standing balance support: Bilateral upper extremity supported Standing balance-Leahy Scale: Fair                      Cognition Arousal/Alertness: Awake/alert Behavior During Therapy: WFL for tasks assessed/performed Overall Cognitive Status: Within Functional Limits for tasks assessed                      Exercises General Exercises - Lower Extremity Long Arc Quad: 15 reps;Both Hip ABduction/ADduction: 15 reps;Both;Strengthening    General Comments        Pertinent Vitals/Pain Vitals stable throughout session    Home Living                      Prior Function            PT Goals (current goals can now be found in the care plan section) Acute Rehab PT Goals Patient Stated Goal: To return home today PT Goal Formulation: With patient Time For Goal Achievement: 01/31/14 Potential to Achieve Goals: Good Progress towards PT goals: Progressing toward goals    Frequency  Min 3X/week    PT Plan Current plan remains appropriate  Co-evaluation             End of Session Equipment Utilized During Treatment: Gait belt Activity Tolerance: Patient tolerated treatment well Patient left: in chair;with call bell/phone within reach     Time: 4163-8453 PT Time Calculation (min): 20 min  Charges:  $Gait Training: 8-22 mins                    G Codes:      Jolyn Lent 02/07/14, 1:10 PM  Jolyn Lent, PT, DPT Acute Rehabilitation Services Pager: [phone]

## 2014-01-27 NOTE — Progress Notes (Signed)
Discharged home with discharge instructions and follow-up appointments. Rolled down to main entrance with wheelchair.

## 2014-01-27 NOTE — Discharge Summary (Signed)
[name], [name] NO.:  [id number]  MEDICAL RECORD NO.:  [id number]  LOCATION:  3E22C                        FACILITY:  [facility]  PHYSICIAN:  Allegra Lai. [name], M.D. DATE OF BIRTH:  1927-04-25  DATE OF ADMISSION:  01/21/2014 DATE OF DISCHARGE:  01/27/2014                              DISCHARGE SUMMARY   ADMITTING DIAGNOSES: 1. Exertional dyspnea/progressive weakness and tiredness, probably     secondary to complete heart block, rule out progression of coronary     artery disease. 2. Coronary artery disease, history of myocardial infarction in the     past, status post coronary artery bypass grafting and percutaneous     coronary intervention to left circumflex in August 2012. 3. Ischemic cardiomyopathy, status post ICD in the past. 4. Mild decompensated congestive heart failure. 5. Hypertension. 6. Diabetes mellitus. 7. Hypercholesteremia. 8. History of nonsustained ventricular tachycardia in the past. 9. History of gouty arthritis. 10.Thrombocytopenia, probably secondary to Brilinta.  DISCHARGE DIAGNOSES: 1. Status post complete heart block. 2. Ischemic cardiomyopathy, status post ICD in the past and now status     post CRTD upgrade. 3. Coronary artery disease, history of coronary artery bypass grafting     in the past, status post percutaneous coronary intervention to left     circumflex in August 2012. 4. Compensated systolic heart failure. 5. Hypertension. 6. Diabetes mellitus. 7. Hypercholesteremia. 8. History of nonsustained ventricular tachycardia in the past. 9. History of gouty arthritis. 10.Thrombocytopenia, probably secondary to Brilinta.  HOME MEDICATIONS: 1. Allopurinol 300 mg 1 tablet daily. 2. Aspirin 81 mg 1 tablet daily. 3. Atorvastatin 20 mg 1 tablet daily. 4. Carvedilol 6.25 mg daily. 5. Colace 100 mg 1 capsule daily. 6. Pepcid 20 mg twice daily. 7. Nitrostat 0.4 mg sublingual use as directed. 8. Pataday 0.2% solution in both  eyes as before. 9. Ramipril 2.5 mg 1 capsule twice daily. 10.Aldactone 25 mg daily. 11.Carafate 1 g 4 times daily as before. 12.Brilinta 90 mg twice daily.  DIET:  Low salt, low cholesterol 1800 calories ADA diet.  ACTIVITY:  Increase activity slowly as tolerated.  FOLLOW UP:  The patient will follow up with me in 1 week and has appointment with EP on February 05, 2014, at 9:30 a.m. for wound check at [facility] EP.  CONDITION AT DISCHARGE:  Stable.  BRIEF HISTORY AND HOSPITAL COURSE:  The patient is an 80 year old male with past medical history significant for coronary artery disease, history of inferior wall myocardial infarction in 1994, status post CABG in 1994.  He had LIMA to LAD, sequential saphenous vein graft to OM 1 and OM 2, saphenous vein graft to PDA, saphenous vein graft to high diagonal/ramus, history of congestive heart failure secondary to depressed LV systolic function, ischemic cardiomyopathy, status post ICD in the past, hypertension, hypercholesteremia, status post PCI to left circumflex in August 2012, history of nonsustained ventricular tachycardia in the past, diabetes mellitus,  history of gouty arthritis, came to the ER complaining of progressive increasing shortness of breath for the last 2 days associated with feeling weak and tired.  The patient also gives a history of PND, orthopnea, and minimal leg swelling.  The patient denies any  chest pain, nausea, vomiting, diaphoresis.  Denies palpitation, lightheadedness, or syncope.  Denies any ICD discharges. The patient states lately, his activity has been limited and gets progressive shortness of breath with minimal exertion.  EKG done in the ER showed sinus rhythm with complete heart block with ventricular escape rhythm, old inferior wall myocardial infarction and right bundle-branch block.  PHYSICAL EXAMINATION:  GENERAL:  He was alert, awake, oriented x3. VITAL SIGNS:  Blood pressure was 145/78, pulse was 69,  temperature was 97.9.  HEENT:  Conjunctivae was pink. NECK:  Supple.  No JVD.  No bruits. LUNGS:  He has decreased breath sounds at bases. CARDIOVASCULAR:  S1, S2 was normal.  There was soft systolic murmur and S3 gallop. EXTREMITIES:  There is no clubbing, cyanosis.  There was trace edema noted. NEUROLOGIC:  Grossly intact.  LABORATORY DATA:  Sodium was 141, potassium 3.9, glucose 128, BUN 22, creatinine 1.34.  Hemoglobin was 11.7, hematocrit 33.8, white count of 3.2, platelet count 106,000.  Three sets of cardiac enzymes were negative.  BRIEF HOSPITAL COURSE:  The patient was admitted to step-down unit.  MI was ruled out by serial enzymes and EKG.  EP consultation was obtained. The patient subsequently underwent upgrade of his ICD to CRTD without any complications.  The patient tolerated the procedure well.  There were no complications.  Postprocedure, the patient had minimal discomfort at the ICD site, which is gradually improving.  OT/PT consultation was obtained.  The patient is ambulating in room without any problems.  The patient did not have any further episodes of chest pain or dizziness during the hospital stay.  The patient will be discharged home on above medications and will be followed up in my office in 1 week as scheduled and EP on February 05, 2014, at 9:30 a.m.  We will monitor his CBC as outpatient and monitor his platelet count.     Allegra Lai. [name], M.D.     MNH/MEDQ  D:  01/27/2014  T:  01/27/2014  Job:  [id number]

## 2014-01-27 NOTE — Discharge Summary (Signed)
  Discharge summary dictated on 01/27/2014 dictation number is [phone]

## 2014-01-27 NOTE — Care Management Note (Signed)
CARE MANAGEMENT NOTE 01/27/2014  Patient:  JODEN, BONSALL   Account Number:  [id number]  Date Initiated:  01/22/2014  Documentation initiated by:  Elissa Hefty  Subjective/Objective Assessment:   adm w 3rd deg heart block     Action/Plan:   lives alone, pcp dr Darl Householder   Anticipated DC Date:  01/27/2014   Anticipated DC Plan:  [place]  CM consult      [facility] Choice  HOME HEALTH   Choice offered to / List presented to:  C-1 Patient        [place] arranged  HH-1 RN  [place]   Status of service:   Medicare Important Message given?  YES (If response is "NO", the following Medicare IM given date fields will be blank) Date Medicare IM given:  01/24/2014 Date Additional Medicare IM given:    Discharge Disposition:  [place]  Per UR Regulation:  Reviewed for med. necessity/level of care/duration of stay  If discussed at [place] of Stay Meetings, dates discussed:    Comments:   01/27/2014 Noted plan to d/c to home with Hospice services. Call placed to [facility] @ [phone] re plan to d/c pt today. Per pt RN , this pt has transportation to home. Will fax d/c summary to Hospice agency when available.  Jasmine Pang RN MPH case manager, [phone]  5/22  1234p debbie dowell rn,bsn pt states he forgot to tell us he's act w hospice out of [place]. [facility] out [facility] [phone] they would like call and fax dc summary at disch.  5/22 1026a debbie dowell rn,bsn spoke w pt, hx of hhc and he lives alone. he would like hhrn to ck on him. has supp neighbor. has cane. used [facility] in past and would like them again. ref to donna w [facility] for hhrn. had chf orders for [facility] but will ask for final orders at disch. [facility] will not see pt as pt act w hospice out of .

## 2014-01-27 NOTE — Discharge Instructions (Signed)
Biventricular Pacemaker Implantation A pacemaker is a small, lightweight, battery-powered device that is placed (implanted) under the skin in the upper chest. Your caregiver may prescribe a pacemaker for you if your heartbeat is too slow (bradycardia). A biventricular pacemaker is a pulse generator connected by wires called leads that go into the two lower chambers on the right and left sides of your heart (ventricles). It is used to treat symptoms of heart failure. The pulse generator is a small computer run by a battery. The generator creates a regular electronic pulse. The pulse is sent through the leads, which go through a blood vessel and into the ventricles of your heart. This type of pacemaker makes a weak heart more efficient. LET YOUR CAREGIVER KNOW ABOUT  Any allergies. Some allergies can cause serious problems during the procedure. Allergies to shellfish or agents, such as iodine, used in liquids that enhance specific areas of your body on X-ray images (contrast dyes) are especially problematic.   All medicines you take. These include vitamins, herbs, eyedrops, over-the-counter medicines, and creams.   Use of steroids.   Problems with numbing medicines (anesthetics).   Bleeding problems.   Past surgeries.   Other health problems. RISKS AND COMPLICATIONS Implanting a biventricular pacemaker is usually a safe procedure but problems can occur. For example:   Too much bleeding may occur.   Infection may develop.   Blood vessels, your lungs, or your heart may be harmed.   The pacemaker may not make your condition better. BEFORE THE PROCEDURE   You may need to have blood tests, heart tests, or a chest X-ray done before the day of the procedure.   Ask your caregiver about changing or stopping your regular medicines.   Make plans to have someone drive you home. You will usually need to stay in the hospital overnight after the procedure.   Stop smoking at least 24  hours before the procedure.   Take a bath or shower the night before the procedure. You may need to scrub your chest with a special type of soap.   Do not eat or drink anything after midnight the night before your procedure. Ask if it is okay to take any needed medicine with a small sip of water. PROCEDURE The procedure to put a pacemaker in your chest is usually done at a hospital in a room that has a large X-ray machine called a fluoroscope. The machine will be above you during the procedure. It will help your doctor see your heart during the procedure. Implanting a biventricular pacemaker usually takes 2 5 hours. Before the procedure:   Small monitors will be put on your body. They will be used to check your heart, blood pressure, and oxygen level.   A needle will be put into a vein in your hand or arm. This is called an intravenous (IV) access tube. Fluids and medicine will flow directly into your body through the IV tube.   Your chest will be cleaned with a germ-killing (antiseptic) solution. Your chest may be shaved.   You may be given medicine to help you relax (sedative).   You will be given a numbing medicine called a local anesthetic. This medicine will make your chest area have no feeling while the pacemaker is implanted. You will be sleepy but awake during the procedure. After you are numb the procedure will begin. The caregiver will:   Make a small cut (incision). This will make a pocket deep under your skin that will  hold the pulse generator.   Guide the leads through a large blood vessel into your heart and attach them to the heart muscles.   Test the pacemaker.   Close the incision with stitches, glue, or staples. AFTER THE PROCEDURE  You may feel pain. Some pain is normal. It may last a few days.   You may stay in a recovery area until the local anesthetic has worn off. Your blood pressure and pulse will be checked often. You will be taken to a room where your  heart beat will be monitored.   A chest X-ray will be taken. This checks that the pacemaker is in the right place.   You may stay in the hospital overnight.   The pacemaker will be checked before you go home. It can be adjusted if that is needed. Document Released: 05/16/2012 Document Reviewed: 05/16/2012 [facility] Patient Information 2014 [place], [place].

## 2014-02-03 DIAGNOSIS — I509 Heart failure, unspecified: Secondary | ICD-10-CM | POA: Diagnosis not present

## 2014-02-04 DIAGNOSIS — I2510 Atherosclerotic heart disease of native coronary artery without angina pectoris: Secondary | ICD-10-CM | POA: Diagnosis not present

## 2014-02-04 DIAGNOSIS — E119 Type 2 diabetes mellitus without complications: Secondary | ICD-10-CM | POA: Diagnosis not present

## 2014-02-04 DIAGNOSIS — E785 Hyperlipidemia, unspecified: Secondary | ICD-10-CM | POA: Diagnosis not present

## 2014-02-04 DIAGNOSIS — I2589 Other forms of chronic ischemic heart disease: Secondary | ICD-10-CM | POA: Diagnosis not present

## 2014-02-04 DIAGNOSIS — I509 Heart failure, unspecified: Secondary | ICD-10-CM | POA: Diagnosis not present

## 2014-02-04 DIAGNOSIS — I10 Essential (primary) hypertension: Secondary | ICD-10-CM | POA: Diagnosis not present

## 2014-02-05 ENCOUNTER — Ambulatory Visit: Payer: Medicare Other

## 2014-02-05 ENCOUNTER — Ambulatory Visit (INDEPENDENT_AMBULATORY_CARE_PROVIDER_SITE_OTHER): Admitting: *Deleted

## 2014-02-05 DIAGNOSIS — I5021 Acute systolic (congestive) heart failure: Secondary | ICD-10-CM

## 2014-02-05 DIAGNOSIS — I5022 Chronic systolic (congestive) heart failure: Secondary | ICD-10-CM

## 2014-02-05 LAB — MDC_IDC_ENUM_SESS_TYPE_INCLINIC
Brady Statistic AP VP Percent: 53.9 %
Brady Statistic AP VS Percent: 2.36 %
Brady Statistic AS VP Percent: 38.7 %
Brady Statistic AS VS Percent: 5.04 %
Brady Statistic RA Percent Paced: 56.26 %
HIGH POWER IMPEDANCE MEASURED VALUE: 190 Ohm
HIGH POWER IMPEDANCE MEASURED VALUE: 32 Ohm
HighPow Impedance: 45 Ohm
Lead Channel Impedance Value: 285 Ohm
Lead Channel Impedance Value: 4047 Ohm
Lead Channel Impedance Value: 4047 Ohm
Lead Channel Impedance Value: 456 Ohm
Lead Channel Impedance Value: 475 Ohm
Lead Channel Pacing Threshold Amplitude: 0.5 V
Lead Channel Pacing Threshold Amplitude: 1.5 V
Lead Channel Pacing Threshold Pulse Width: 0.4 ms
Lead Channel Pacing Threshold Pulse Width: 0.4 ms
Lead Channel Sensing Intrinsic Amplitude: 2.875 mV
Lead Channel Sensing Intrinsic Amplitude: 3.375 mV
Lead Channel Sensing Intrinsic Amplitude: 3.875 mV
Lead Channel Sensing Intrinsic Amplitude: 4 mV
Lead Channel Setting Pacing Amplitude: 3 V
Lead Channel Setting Pacing Amplitude: 3.5 V
Lead Channel Setting Pacing Pulse Width: 0.4 ms
Lead Channel Setting Pacing Pulse Width: 0.4 ms
Lead Channel Setting Sensing Sensitivity: 0.3 mV
MDC IDC MSMT BATTERY REMAINING LONGEVITY: 82 mo
MDC IDC MSMT BATTERY VOLTAGE: 3.13 V
MDC IDC MSMT LEADCHNL RA PACING THRESHOLD AMPLITUDE: 0.75 V
MDC IDC MSMT LEADCHNL RV PACING THRESHOLD PULSEWIDTH: 0.4 ms
MDC IDC SESS DTM: 20150603152003
MDC IDC SET LEADCHNL RV PACING AMPLITUDE: 2.5 V
MDC IDC SET ZONE DETECTION INTERVAL: 350 ms
MDC IDC SET ZONE DETECTION INTERVAL: 450 ms
MDC IDC STAT BRADY RV PERCENT PACED: 81.37 %
Zone Setting Detection Interval: 300 ms
Zone Setting Detection Interval: 350 ms

## 2014-02-05 NOTE — Progress Notes (Signed)
Wound check appointment. Steri-strips removed. Resolving hematoma noted. Incision edges approximated, wound well healed. Normal device function. Thresholds, sensing, and impedances consistent with implant measurements. Device programmed at 3.5V for extra safety margin until 3 month visit. Histogram distribution appropriate for patient and level of activity. No mode switches or ventricular arrhythmias noted. Patient educated about wound care, arm mobility, lifting restrictions, shock plan. ROV in 3 months with implanting physician.

## 2014-02-13 ENCOUNTER — Encounter: Payer: Medicare Other | Admitting: Internal Medicine

## 2014-02-20 ENCOUNTER — Encounter: Payer: Self-pay | Admitting: Internal Medicine

## 2014-03-05 DIAGNOSIS — I509 Heart failure, unspecified: Secondary | ICD-10-CM | POA: Diagnosis not present

## 2014-03-06 ENCOUNTER — Encounter: Payer: Medicare Other | Admitting: Internal Medicine

## 2014-03-06 DIAGNOSIS — I509 Heart failure, unspecified: Secondary | ICD-10-CM | POA: Diagnosis not present

## 2014-03-10 ENCOUNTER — Encounter: Payer: Medicare Other | Admitting: Internal Medicine

## 2014-03-11 ENCOUNTER — Ambulatory Visit (INDEPENDENT_AMBULATORY_CARE_PROVIDER_SITE_OTHER): Admitting: Internal Medicine

## 2014-03-11 ENCOUNTER — Encounter: Payer: Self-pay | Admitting: Internal Medicine

## 2014-03-11 VITALS — BP 118/78 | HR 52 | Ht 63.5 in | Wt 145.4 lb

## 2014-03-11 DIAGNOSIS — I428 Other cardiomyopathies: Secondary | ICD-10-CM

## 2014-03-11 DIAGNOSIS — Z95810 Presence of automatic (implantable) cardiac defibrillator: Secondary | ICD-10-CM

## 2014-03-11 DIAGNOSIS — I5022 Chronic systolic (congestive) heart failure: Secondary | ICD-10-CM

## 2014-03-11 LAB — MDC_IDC_ENUM_SESS_TYPE_INCLINIC
Brady Statistic AP VP Percent: 55.95 %
Brady Statistic AP VS Percent: 2.36 %
Brady Statistic AS VP Percent: 36.18 %
Brady Statistic RV Percent Paced: 82.74 %
Date Time Interrogation Session: 20150707153625
HIGH POWER IMPEDANCE MEASURED VALUE: 228 Ohm
HIGH POWER IMPEDANCE MEASURED VALUE: 37 Ohm
HIGH POWER IMPEDANCE MEASURED VALUE: 51 Ohm
Lead Channel Impedance Value: 4047 Ohm
Lead Channel Impedance Value: 4047 Ohm
Lead Channel Impedance Value: 475 Ohm
Lead Channel Pacing Threshold Amplitude: 0.5 V
Lead Channel Pacing Threshold Amplitude: 1.5 V
Lead Channel Pacing Threshold Pulse Width: 0.4 ms
Lead Channel Pacing Threshold Pulse Width: 0.4 ms
Lead Channel Sensing Intrinsic Amplitude: 2.875 mV
Lead Channel Sensing Intrinsic Amplitude: 4.125 mV
Lead Channel Setting Pacing Amplitude: 2 V
Lead Channel Setting Pacing Amplitude: 2.5 V
Lead Channel Setting Pacing Amplitude: 2.75 V
Lead Channel Setting Pacing Pulse Width: 0.4 ms
Lead Channel Setting Pacing Pulse Width: 0.4 ms
MDC IDC MSMT BATTERY REMAINING LONGEVITY: 84 mo
MDC IDC MSMT BATTERY VOLTAGE: 3.09 V
MDC IDC MSMT LEADCHNL LV IMPEDANCE VALUE: 304 Ohm
MDC IDC MSMT LEADCHNL RA IMPEDANCE VALUE: 532 Ohm
MDC IDC MSMT LEADCHNL RA PACING THRESHOLD PULSEWIDTH: 0.4 ms
MDC IDC MSMT LEADCHNL RA SENSING INTR AMPL: 3.75 mV
MDC IDC MSMT LEADCHNL RV PACING THRESHOLD AMPLITUDE: 0.75 V
MDC IDC MSMT LEADCHNL RV SENSING INTR AMPL: 5 mV
MDC IDC SET LEADCHNL RV SENSING SENSITIVITY: 0.3 mV
MDC IDC SET ZONE DETECTION INTERVAL: 300 ms
MDC IDC STAT BRADY AS VS PERCENT: 5.51 %
MDC IDC STAT BRADY RA PERCENT PACED: 58.31 %
Zone Setting Detection Interval: 350 ms
Zone Setting Detection Interval: 350 ms
Zone Setting Detection Interval: 450 ms

## 2014-03-11 NOTE — Progress Notes (Signed)
HPI [name] returns today for followup. He is a pleasant 80 yo man with a h/o chronic systolic heart failure, CAD, PVC's s/p ICD insertion. He denies chest pain. He has been sedentary. He is still in hospice but I am not sure that this is approriate as he has done very well. I do not expect him to die in the next 6 months or for that matter in the next year. No syncope or palpitions.  No Known Allergies   Current Outpatient Prescriptions  Medication Sig Dispense Refill  . allopurinol (ZYLOPRIM) 300 MG tablet Take 300 mg by mouth daily.        Marland Kitchen aspirin 81 MG tablet Take 81 mg by mouth daily.        Marland Kitchen atorvastatin (LIPITOR) 20 MG tablet Take 20 mg by mouth daily.        . carvedilol (COREG) 6.25 MG tablet Take 3.125 mg by mouth 2 (two) times daily.       Marland Kitchen docusate sodium (COLACE) 100 MG capsule Take 1 capsule (100 mg total) by mouth every 12 (twelve) hours.  30 capsule  0  . famotidine (PEPCID) 20 MG tablet Take 20 mg by mouth 2 (two) times daily.        Marland Kitchen NITROSTAT 0.4 MG SL tablet Place 0.4 mg under the tongue every 5 (five) minutes as needed.       Marland Kitchen PATADAY 0.2 % SOLN Place 1 drop into both eyes daily.      . ramipril (ALTACE) 2.5 MG capsule Take 2.5 mg by mouth 2 (two) times daily.        . sucralfate (CARAFATE) 1 G tablet Take 1 g by mouth 3 (three) times daily.       . Ticagrelor (BRILINTA) 90 MG TABS tablet Take 90 mg by mouth 2 (two) times daily.         No current facility-administered medications for this visit.     Past Medical History  Diagnosis Date  . CAD (coronary artery disease)     s/p CABG; s/p Pacemaker  . Systolic CHF with reduced left ventricular function, NYHA class 2   . Ischemic cardiomyopathy     s/p ICD  . Hypertension   . Hyperlipidemia   . Diabetes mellitus   . Gout   . Paroxysmal ventricular tachycardia     ROS:   All systems reviewed and negative except as noted in the HPI.   Past Surgical History  Procedure Laterality Date  .  Coronary artery bypass graft    . Cardiac defibrillator placement      generator change 10/12/11  . Bowel resection    . Coronary angioplasty with stent placement    . Bi-ventricular implantable cardioverter defibrillator upgrade  01-22-2014    upgrade of previously implanted single chamber ICD to MDT CRTD by Dr [name] with new atrial lead placement - epicardial LV leads used     No family history on file.   History   Social History  . Marital Status: Divorced    Spouse Name: N/A    Number of Children: N/A  . Years of Education: N/A   Occupational History  . Not on file.   Social History Main Topics  . Smoking status: Current Some Day Smoker    Types: Pipe, Cigars  . Smokeless tobacco: Not on file     Comment: every "now and then"  . Alcohol Use: No     Comment: occasional  .  Drug Use: No  . Sexual Activity: Not on file   Other Topics Concern  . Not on file   Social History Narrative  . No narrative on file     BP 118/78  Pulse 52  Ht 5' 3.5" (1.613 m)  Wt 145 lb 6.4 oz (65.953 kg)  BMI 25.35 kg/m2  Physical Exam:  Well appearing 80 yo man, NAD HEENT: Unremarkable Neck:  No JVD, no thyromegally Back:  No CVA tenderness Lungs:  Clear with no wheezes HEART:  Regular rate rhythm, no murmurs, no rubs, no clicks Abd:  soft, positive bowel sounds, no organomegally, no rebound, no guarding Ext:  2 plus pulses, no edema, no cyanosis, no clubbing Skin:  No rashes no nodules Neuro:  CN II through XII intact, motor grossly intact  EKG - nsr  DEVICE  Normal device function.  See PaceArt for details.   Assess/Plan:

## 2014-03-11 NOTE — Assessment & Plan Note (Signed)
His device is working normally. Will recheck in several months. 

## 2014-03-11 NOTE — Assessment & Plan Note (Signed)
His symptoms are well compensated. He will continue his current medical therapy.

## 2014-03-11 NOTE — Patient Instructions (Signed)
Your physician wants you to follow-up in: 9 months with Dr. [name]. You will receive a reminder letter in the mail two months in advance. If you don't receive a letter, please call our office to schedule the follow-up appointment.  Remote monitoring is used to monitor your Pacemaker of ICD from home. This monitoring reduces the number of office visits required to check your device to one time per year. It allows [place] to keep an eye on the functioning of your device to ensure it is working properly. You are scheduled for a device check from home on  June 12, 2014. You may send your transmission at any time that day. If you have a wireless device, the transmission will be sent automatically. After your physician reviews your transmission, you will receive a postcard with your next transmission date.  Your physician recommends that you continue on your current medications as directed. Please refer to the Current Medication list given to you today.

## 2014-03-13 DIAGNOSIS — I509 Heart failure, unspecified: Secondary | ICD-10-CM | POA: Diagnosis not present

## 2014-03-14 DIAGNOSIS — I509 Heart failure, unspecified: Secondary | ICD-10-CM | POA: Diagnosis not present

## 2014-03-19 DIAGNOSIS — I509 Heart failure, unspecified: Secondary | ICD-10-CM | POA: Diagnosis not present

## 2014-03-21 DIAGNOSIS — I509 Heart failure, unspecified: Secondary | ICD-10-CM | POA: Diagnosis not present

## 2014-03-25 DIAGNOSIS — I509 Heart failure, unspecified: Secondary | ICD-10-CM | POA: Diagnosis not present

## 2014-03-28 DIAGNOSIS — I509 Heart failure, unspecified: Secondary | ICD-10-CM | POA: Diagnosis not present

## 2014-04-04 DIAGNOSIS — I509 Heart failure, unspecified: Secondary | ICD-10-CM | POA: Diagnosis not present

## 2014-04-05 DIAGNOSIS — I509 Heart failure, unspecified: Secondary | ICD-10-CM | POA: Diagnosis not present

## 2014-04-16 DIAGNOSIS — I2510 Atherosclerotic heart disease of native coronary artery without angina pectoris: Secondary | ICD-10-CM | POA: Diagnosis not present

## 2014-04-16 DIAGNOSIS — D696 Thrombocytopenia, unspecified: Secondary | ICD-10-CM | POA: Diagnosis not present

## 2014-04-16 DIAGNOSIS — I2589 Other forms of chronic ischemic heart disease: Secondary | ICD-10-CM | POA: Diagnosis not present

## 2014-04-16 DIAGNOSIS — I509 Heart failure, unspecified: Secondary | ICD-10-CM | POA: Diagnosis not present

## 2014-04-16 DIAGNOSIS — I10 Essential (primary) hypertension: Secondary | ICD-10-CM | POA: Diagnosis not present

## 2014-05-06 DIAGNOSIS — I509 Heart failure, unspecified: Secondary | ICD-10-CM | POA: Diagnosis not present

## 2014-05-20 DIAGNOSIS — E785 Hyperlipidemia, unspecified: Secondary | ICD-10-CM | POA: Diagnosis not present

## 2014-05-20 DIAGNOSIS — D696 Thrombocytopenia, unspecified: Secondary | ICD-10-CM | POA: Diagnosis not present

## 2014-05-20 DIAGNOSIS — I10 Essential (primary) hypertension: Secondary | ICD-10-CM | POA: Diagnosis not present

## 2014-05-20 DIAGNOSIS — I509 Heart failure, unspecified: Secondary | ICD-10-CM | POA: Diagnosis not present

## 2014-05-20 DIAGNOSIS — I2589 Other forms of chronic ischemic heart disease: Secondary | ICD-10-CM | POA: Diagnosis not present

## 2014-05-20 DIAGNOSIS — I2510 Atherosclerotic heart disease of native coronary artery without angina pectoris: Secondary | ICD-10-CM | POA: Diagnosis not present

## 2014-06-05 DIAGNOSIS — I509 Heart failure, unspecified: Secondary | ICD-10-CM | POA: Diagnosis not present

## 2014-06-06 DIAGNOSIS — I509 Heart failure, unspecified: Secondary | ICD-10-CM | POA: Diagnosis not present

## 2014-06-12 ENCOUNTER — Encounter: Admitting: *Deleted

## 2014-06-12 ENCOUNTER — Telehealth: Payer: Self-pay | Admitting: Cardiology

## 2014-06-12 DIAGNOSIS — I509 Heart failure, unspecified: Secondary | ICD-10-CM | POA: Diagnosis not present

## 2014-06-12 NOTE — Telephone Encounter (Signed)
Spoke with pt and reminded pt of remote transmission that is due today. Pt verbalized understanding.   

## 2014-06-13 ENCOUNTER — Encounter: Payer: Self-pay | Admitting: Cardiology

## 2014-06-13 DIAGNOSIS — I509 Heart failure, unspecified: Secondary | ICD-10-CM | POA: Diagnosis not present

## 2014-06-16 DIAGNOSIS — I509 Heart failure, unspecified: Secondary | ICD-10-CM | POA: Diagnosis not present

## 2014-06-17 ENCOUNTER — Ambulatory Visit (INDEPENDENT_AMBULATORY_CARE_PROVIDER_SITE_OTHER): Admitting: *Deleted

## 2014-06-17 ENCOUNTER — Telehealth: Payer: Self-pay | Admitting: Internal Medicine

## 2014-06-17 ENCOUNTER — Encounter: Payer: Self-pay | Admitting: Internal Medicine

## 2014-06-17 DIAGNOSIS — I2510 Atherosclerotic heart disease of native coronary artery without angina pectoris: Secondary | ICD-10-CM | POA: Diagnosis not present

## 2014-06-17 DIAGNOSIS — Z23 Encounter for immunization: Secondary | ICD-10-CM | POA: Diagnosis not present

## 2014-06-17 DIAGNOSIS — K219 Gastro-esophageal reflux disease without esophagitis: Secondary | ICD-10-CM | POA: Diagnosis not present

## 2014-06-17 DIAGNOSIS — I255 Ischemic cardiomyopathy: Secondary | ICD-10-CM

## 2014-06-17 DIAGNOSIS — I5022 Chronic systolic (congestive) heart failure: Secondary | ICD-10-CM

## 2014-06-17 DIAGNOSIS — E785 Hyperlipidemia, unspecified: Secondary | ICD-10-CM | POA: Diagnosis not present

## 2014-06-17 DIAGNOSIS — I509 Heart failure, unspecified: Secondary | ICD-10-CM | POA: Diagnosis not present

## 2014-06-17 DIAGNOSIS — F329 Major depressive disorder, single episode, unspecified: Secondary | ICD-10-CM | POA: Diagnosis not present

## 2014-06-17 LAB — MDC_IDC_ENUM_SESS_TYPE_REMOTE
Brady Statistic AP VP Percent: 48.5 %
Brady Statistic AS VS Percent: 7.8 %
HighPow Impedance: 31 Ohm
Lead Channel Impedance Value: 285 Ohm
Lead Channel Impedance Value: 456 Ohm
Lead Channel Impedance Value: 513 Ohm
Lead Channel Pacing Threshold Amplitude: 1.25 V
Lead Channel Pacing Threshold Pulse Width: 0.4 ms
Lead Channel Pacing Threshold Pulse Width: 0.4 ms
Lead Channel Setting Pacing Amplitude: 2 V
Lead Channel Setting Pacing Amplitude: 2.5 V
Lead Channel Setting Pacing Pulse Width: 0.4 ms
MDC IDC MSMT LEADCHNL RA PACING THRESHOLD AMPLITUDE: 0.625 V
MDC IDC MSMT LEADCHNL RA PACING THRESHOLD PULSEWIDTH: 0.4 ms
MDC IDC MSMT LEADCHNL RA SENSING INTR AMPL: 3 mV
MDC IDC MSMT LEADCHNL RV PACING THRESHOLD AMPLITUDE: 0.625 V
MDC IDC MSMT LEADCHNL RV SENSING INTR AMPL: 2.8 mV
MDC IDC SET LEADCHNL LV PACING AMPLITUDE: 2.75 V
MDC IDC SET LEADCHNL RV PACING PULSEWIDTH: 0.4 ms
MDC IDC SET LEADCHNL RV SENSING SENSITIVITY: 0.3 mV
MDC IDC SET ZONE DETECTION INTERVAL: 300 ms
MDC IDC SET ZONE DETECTION INTERVAL: 350 ms
MDC IDC STAT BRADY AP VS PERCENT: 1.3 %
MDC IDC STAT BRADY AS VP PERCENT: 42.4 %
Zone Setting Detection Interval: 350 ms
Zone Setting Detection Interval: 450 ms

## 2014-06-17 NOTE — Telephone Encounter (Signed)
New message      Did we get patient's pacer transmission?

## 2014-06-17 NOTE — Telephone Encounter (Signed)
Informed pt nurse that the transmission was received.

## 2014-06-17 NOTE — Progress Notes (Signed)
Remote ICD transmission.   

## 2014-06-18 DIAGNOSIS — I509 Heart failure, unspecified: Secondary | ICD-10-CM | POA: Diagnosis not present

## 2014-06-20 DIAGNOSIS — I509 Heart failure, unspecified: Secondary | ICD-10-CM | POA: Diagnosis not present

## 2014-06-26 DIAGNOSIS — I509 Heart failure, unspecified: Secondary | ICD-10-CM | POA: Diagnosis not present

## 2014-06-27 ENCOUNTER — Encounter: Payer: Self-pay | Admitting: *Deleted

## 2014-06-27 DIAGNOSIS — I509 Heart failure, unspecified: Secondary | ICD-10-CM | POA: Diagnosis not present

## 2014-07-02 ENCOUNTER — Telehealth: Payer: Self-pay | Admitting: Internal Medicine

## 2014-07-02 DIAGNOSIS — I509 Heart failure, unspecified: Secondary | ICD-10-CM | POA: Diagnosis not present

## 2014-07-02 NOTE — Telephone Encounter (Signed)
New message ° ° ° ° ° ° °Did we get his remote transmission? °

## 2014-07-02 NOTE — Telephone Encounter (Signed)
Informed pt caregiver that we received transmission on 06-17-14. She verbalized understanding.

## 2014-07-04 DIAGNOSIS — I509 Heart failure, unspecified: Secondary | ICD-10-CM | POA: Diagnosis not present

## 2014-07-06 DIAGNOSIS — I509 Heart failure, unspecified: Secondary | ICD-10-CM | POA: Diagnosis not present

## 2014-07-09 DIAGNOSIS — I509 Heart failure, unspecified: Secondary | ICD-10-CM | POA: Diagnosis not present

## 2014-07-11 DIAGNOSIS — I509 Heart failure, unspecified: Secondary | ICD-10-CM | POA: Diagnosis not present

## 2014-07-15 DIAGNOSIS — I509 Heart failure, unspecified: Secondary | ICD-10-CM | POA: Diagnosis not present

## 2014-07-16 DIAGNOSIS — I509 Heart failure, unspecified: Secondary | ICD-10-CM | POA: Diagnosis not present

## 2014-07-18 DIAGNOSIS — I509 Heart failure, unspecified: Secondary | ICD-10-CM | POA: Diagnosis not present

## 2014-07-23 DIAGNOSIS — I509 Heart failure, unspecified: Secondary | ICD-10-CM | POA: Diagnosis not present

## 2014-07-26 ENCOUNTER — Inpatient Hospital Stay (HOSPITAL_COMMUNITY)
Admission: EM | Admit: 2014-07-26 | Discharge: 2014-07-30 | DRG: 292 | Disposition: A | Discharge status: Home / Self Care | Payer: Medicare Other | Attending: Cardiology | Admitting: Cardiology

## 2014-07-26 ENCOUNTER — Encounter (HOSPITAL_COMMUNITY): Payer: Self-pay | Admitting: Emergency Medicine

## 2014-07-26 ENCOUNTER — Emergency Department (HOSPITAL_COMMUNITY): Payer: Medicare Other

## 2014-07-26 DIAGNOSIS — R0602 Shortness of breath: Secondary | ICD-10-CM

## 2014-07-26 DIAGNOSIS — I2510 Atherosclerotic heart disease of native coronary artery without angina pectoris: Secondary | ICD-10-CM | POA: Diagnosis not present

## 2014-07-26 DIAGNOSIS — I5021 Acute systolic (congestive) heart failure: Secondary | ICD-10-CM | POA: Diagnosis not present

## 2014-07-26 DIAGNOSIS — I509 Heart failure, unspecified: Secondary | ICD-10-CM

## 2014-07-26 DIAGNOSIS — I252 Old myocardial infarction: Secondary | ICD-10-CM | POA: Diagnosis not present

## 2014-07-26 DIAGNOSIS — I255 Ischemic cardiomyopathy: Secondary | ICD-10-CM | POA: Diagnosis present

## 2014-07-26 DIAGNOSIS — I4891 Unspecified atrial fibrillation: Secondary | ICD-10-CM | POA: Diagnosis present

## 2014-07-26 DIAGNOSIS — I442 Atrioventricular block, complete: Secondary | ICD-10-CM | POA: Diagnosis present

## 2014-07-26 DIAGNOSIS — I480 Paroxysmal atrial fibrillation: Secondary | ICD-10-CM | POA: Diagnosis not present

## 2014-07-26 DIAGNOSIS — Z951 Presence of aortocoronary bypass graft: Secondary | ICD-10-CM

## 2014-07-26 DIAGNOSIS — M1000 Idiopathic gout, unspecified site: Secondary | ICD-10-CM | POA: Diagnosis present

## 2014-07-26 DIAGNOSIS — Z720 Tobacco use: Secondary | ICD-10-CM

## 2014-07-26 DIAGNOSIS — I4892 Unspecified atrial flutter: Secondary | ICD-10-CM | POA: Diagnosis present

## 2014-07-26 DIAGNOSIS — I5022 Chronic systolic (congestive) heart failure: Secondary | ICD-10-CM

## 2014-07-26 DIAGNOSIS — E119 Type 2 diabetes mellitus without complications: Secondary | ICD-10-CM | POA: Diagnosis present

## 2014-07-26 DIAGNOSIS — I10 Essential (primary) hypertension: Secondary | ICD-10-CM | POA: Diagnosis present

## 2014-07-26 DIAGNOSIS — Z955 Presence of coronary angioplasty implant and graft: Secondary | ICD-10-CM | POA: Diagnosis not present

## 2014-07-26 DIAGNOSIS — I5023 Acute on chronic systolic (congestive) heart failure: Secondary | ICD-10-CM | POA: Diagnosis present

## 2014-07-26 DIAGNOSIS — E780 Pure hypercholesterolemia: Secondary | ICD-10-CM | POA: Diagnosis present

## 2014-07-26 DIAGNOSIS — Z95810 Presence of automatic (implantable) cardiac defibrillator: Secondary | ICD-10-CM | POA: Diagnosis not present

## 2014-07-26 DIAGNOSIS — I259 Chronic ischemic heart disease, unspecified: Secondary | ICD-10-CM | POA: Diagnosis not present

## 2014-07-26 DIAGNOSIS — E785 Hyperlipidemia, unspecified: Secondary | ICD-10-CM | POA: Diagnosis not present

## 2014-07-26 LAB — CBC WITH DIFFERENTIAL/PLATELET
Basophils Absolute: 0 10*3/uL (ref 0.0–0.1)
Basophils Relative: 0 % (ref 0–1)
EOS ABS: 0 10*3/uL (ref 0.0–0.7)
Eosinophils Relative: 1 % (ref 0–5)
HCT: 34.3 % — ABNORMAL LOW (ref 39.0–52.0)
HEMOGLOBIN: 11.9 g/dL — AB (ref 13.0–17.0)
LYMPHS ABS: 1 10*3/uL (ref 0.7–4.0)
Lymphocytes Relative: 17 % (ref 12–46)
MCH: 32.2 pg (ref 26.0–34.0)
MCHC: 34.7 g/dL (ref 30.0–36.0)
MCV: 92.7 fL (ref 78.0–100.0)
MONO ABS: 0.6 10*3/uL (ref 0.1–1.0)
MONOS PCT: 10 % (ref 3–12)
Neutro Abs: 4.4 10*3/uL (ref 1.7–7.7)
Neutrophils Relative %: 72 % (ref 43–77)
Platelets: 128 10*3/uL — ABNORMAL LOW (ref 150–400)
RBC: 3.7 MIL/uL — AB (ref 4.22–5.81)
RDW: 13.1 % (ref 11.5–15.5)
WBC: 6 10*3/uL (ref 4.0–10.5)

## 2014-07-26 LAB — TROPONIN I: Troponin I: <0.3 ng/mL (ref <0.30)

## 2014-07-26 LAB — MRSA PCR SCREENING: MRSA by PCR: NEGATIVE

## 2014-07-26 LAB — TSH: TSH: 2.39 u[IU]/mL (ref 0.350–4.500)

## 2014-07-26 LAB — BASIC METABOLIC PANEL
ANION GAP: 16 — AB (ref 5–15)
BUN: 25 mg/dL — ABNORMAL HIGH (ref 6–23)
CALCIUM: 9.6 mg/dL (ref 8.4–10.5)
CO2: 21 mEq/L (ref 19–32)
Chloride: 101 mEq/L (ref 96–112)
Creatinine, Ser: 1.38 mg/dL — ABNORMAL HIGH (ref 0.50–1.35)
GFR calc Af Amer: 52 mL/min — ABNORMAL LOW (ref >90)
GFR, EST NON AFRICAN AMERICAN: 45 mL/min — AB (ref >90)
GLUCOSE: 197 mg/dL — AB (ref 70–99)
Potassium: 5 mEq/L (ref 3.7–5.3)
Sodium: 138 mEq/L (ref 137–147)

## 2014-07-26 LAB — I-STAT TROPONIN, ED: Troponin i, poc: 0.01 ng/mL (ref 0.00–0.08)

## 2014-07-26 LAB — MAGNESIUM: MAGNESIUM: 2 mg/dL (ref 1.5–2.5)

## 2014-07-26 LAB — PRO B NATRIURETIC PEPTIDE: Pro B Natriuretic peptide (BNP): 2011 pg/mL — ABNORMAL HIGH (ref 0–450)

## 2014-07-26 MED ORDER — ALLOPURINOL 300 MG PO TABS
300.0000 mg | ORAL_TABLET | Freq: Every day | ORAL | Status: DC
Start: 2014-07-26 — End: 2014-07-30
  Administered 2014-07-26 – 2014-07-30 (×5): 300 mg via ORAL
  Filled 2014-07-26 (×5): qty 1

## 2014-07-26 MED ORDER — CARVEDILOL 3.125 MG PO TABS
3.1250 mg | ORAL_TABLET | Freq: Two times a day (BID) | ORAL | Status: DC
  Administered 2014-07-26 – 2014-07-30 (×7): 3.125 mg via ORAL
  Filled 2014-07-26 (×9): qty 1

## 2014-07-26 MED ORDER — HEPARIN (PORCINE) IN NACL 100-0.45 UNIT/ML-% IJ SOLN
850.0000 [IU]/h | INTRAMUSCULAR | Status: DC
  Administered 2014-07-26 – 2014-07-29 (×3): 850 [IU]/h via INTRAVENOUS
  Filled 2014-07-26 (×5): qty 250

## 2014-07-26 MED ORDER — SODIUM CHLORIDE 0.9 % IJ SOLN
3.0000 mL | Freq: Two times a day (BID) | INTRAMUSCULAR | Status: DC
  Administered 2014-07-26 – 2014-07-30 (×5): 3 mL via INTRAVENOUS

## 2014-07-26 MED ORDER — ACETAMINOPHEN 325 MG PO TABS
650.0000 mg | ORAL_TABLET | ORAL | Status: DC | PRN
  Administered 2014-07-27 (×2): 650 mg via ORAL
  Filled 2014-07-26 (×2): qty 2

## 2014-07-26 MED ORDER — ATORVASTATIN CALCIUM 20 MG PO TABS
20.0000 mg | ORAL_TABLET | Freq: Every day | ORAL | Status: DC
  Administered 2014-07-26 – 2014-07-29 (×4): 20 mg via ORAL
  Filled 2014-07-26 (×5): qty 1

## 2014-07-26 MED ORDER — ONDANSETRON HCL 4 MG/2ML IJ SOLN: 4.0000 mg | Freq: Four times a day (QID) | INTRAMUSCULAR | Status: DC | PRN

## 2014-07-26 MED ORDER — SUCRALFATE 1 G PO TABS
1.0000 g | ORAL_TABLET | Freq: Three times a day (TID) | ORAL | Status: DC
  Administered 2014-07-26 – 2014-07-30 (×11): 1 g via ORAL
  Filled 2014-07-26 (×13): qty 1

## 2014-07-26 MED ORDER — SPIRONOLACTONE 12.5 MG HALF TABLET
12.5000 mg | ORAL_TABLET | Freq: Every day | ORAL | Status: DC
  Administered 2014-07-26 – 2014-07-30 (×5): 12.5 mg via ORAL
  Filled 2014-07-26 (×5): qty 1

## 2014-07-26 MED ORDER — SODIUM CHLORIDE 0.9 % IJ SOLN: 3.0000 mL | INTRAMUSCULAR | Status: DC | PRN

## 2014-07-26 MED ORDER — RAMIPRIL 2.5 MG PO CAPS
2.5000 mg | ORAL_CAPSULE | Freq: Every day | ORAL | Status: DC
  Administered 2014-07-26: 2.5 mg via ORAL
  Filled 2014-07-26 (×2): qty 1

## 2014-07-26 MED ORDER — DOCUSATE SODIUM 100 MG PO CAPS
100.0000 mg | ORAL_CAPSULE | Freq: Two times a day (BID) | ORAL | Status: DC
  Administered 2014-07-26 – 2014-07-30 (×8): 100 mg via ORAL
  Filled 2014-07-26 (×10): qty 1

## 2014-07-26 MED ORDER — HYDROCODONE-ACETAMINOPHEN 5-325 MG PO TABS
2.0000 | ORAL_TABLET | Freq: Once | ORAL | Status: AC
  Administered 2014-07-26: 2 via ORAL
  Filled 2014-07-26: qty 2

## 2014-07-26 MED ORDER — ASPIRIN 81 MG PO CHEW
81.0000 mg | CHEWABLE_TABLET | Freq: Every day | ORAL | Status: DC
  Administered 2014-07-26 – 2014-07-30 (×5): 81 mg via ORAL
  Filled 2014-07-26 (×5): qty 1

## 2014-07-26 MED ORDER — HEPARIN BOLUS VIA INFUSION
3000.0000 [IU] | Freq: Once | INTRAVENOUS | Status: AC
  Administered 2014-07-26: 3000 [IU] via INTRAVENOUS
  Filled 2014-07-26: qty 3000

## 2014-07-26 MED ORDER — SODIUM CHLORIDE 0.9 % IV SOLN: 250.0000 mL | INTRAVENOUS | Status: DC | PRN

## 2014-07-26 MED ORDER — ESCITALOPRAM OXALATE 10 MG PO TABS
10.0000 mg | ORAL_TABLET | Freq: Every day | ORAL | Status: DC
  Administered 2014-07-26 – 2014-07-30 (×5): 10 mg via ORAL
  Filled 2014-07-26 (×5): qty 1

## 2014-07-26 MED ORDER — ASPIRIN 81 MG PO TABS: 81.0000 mg | ORAL_TABLET | Freq: Every day | ORAL | Status: DC

## 2014-07-26 MED ORDER — FAMOTIDINE 20 MG PO TABS
20.0000 mg | ORAL_TABLET | Freq: Two times a day (BID) | ORAL | Status: DC
  Administered 2014-07-26 – 2014-07-30 (×8): 20 mg via ORAL
  Filled 2014-07-26 (×9): qty 1

## 2014-07-26 MED ORDER — NITROGLYCERIN 0.4 MG SL SUBL: 0.4000 mg | SUBLINGUAL_TABLET | SUBLINGUAL | Status: DC | PRN

## 2014-07-26 NOTE — ED Notes (Signed)
Zoll pads placed on pt d/t irregular HR

## 2014-07-26 NOTE — H&P (Signed)
[name] is an 80 y.o. male.   Chief Complaint: Progressive increasing shortness of breath associated with tired feeling HPI: Patient is 80 year old male with past medical history significant for multiple medical problems i.e. coronary artery disease history of inferior wall myocardial infarction in 1994*subsequently had CABG , status post PCI to left circumflex in August 2012, ischemic cardiomyopathy status post ICD in the past, status post complete heart block requiring CRT-D upgrade in May 2015, hypertension, diabetes mellitus, hypercholesteremia, history of nonsustained VT in the past, gouty arthritis, came to the ER complaining of progressive increasing shortness of breath associated with feeling weak tired fatigued and no energy for last few days. Patient denies any palpitations lightheadedness or syncope. Denies any ICD discharges. Denies any chest pain nausea vomiting diaphoresis. Although activity is very limited denies PND orthopnea leg swelling. Patient was noted to have irregularly irregular heart rhythm on the monitor which was new and also was noted to be in mild congestive heart failure.  Past Medical History  Diagnosis Date  . CAD (coronary artery disease)     s/p CABG; s/p Pacemaker  . Systolic CHF with reduced left ventricular function, NYHA class 2   . Ischemic cardiomyopathy     s/p ICD  . Hypertension   . Hyperlipidemia   . Diabetes mellitus   . Gout   . Paroxysmal ventricular tachycardia     Past Surgical History  Procedure Laterality Date  . Coronary artery bypass graft    . Cardiac defibrillator placement      generator change 10/12/11  . Bowel resection    . Coronary angioplasty with stent placement    . Bi-ventricular implantable cardioverter defibrillator upgrade  01-22-2014    upgrade of previously implanted single chamber ICD to MDT CRTD by Dr [name] with new atrial lead placement - epicardial LV leads used    History reviewed. No pertinent family  history. Social History:  reports that he has been smoking Pipe and Cigars.  He does not have any smokeless tobacco history on file. He reports that he does not drink alcohol or use illicit drugs.  Allergies: No Known Allergies   (Not in a hospital admission)  Results for orders placed or performed during the hospital encounter of 07/26/14 (from the past 48 hour(s))  Basic metabolic panel     Status: Abnormal   Collection Time: 07/26/14  4:08 PM  Result Value Ref Range   Sodium 138 137 - 147 mEq/L   Potassium 5.0 3.7 - 5.3 mEq/L   Chloride 101 96 - 112 mEq/L   CO2 21 19 - 32 mEq/L   Glucose, Bld 197 (H) 70 - 99 mg/dL   BUN 25 (H) 6 - 23 mg/dL   Creatinine, Ser 1.38 (H) 0.50 - 1.35 mg/dL   Calcium 9.6 8.4 - 10.5 mg/dL   GFR calc non Af Amer 45 (L) >90 mL/min   GFR calc Af Amer 52 (L) >90 mL/min    Comment: (NOTE) The eGFR has been calculated using the CKD EPI equation. This calculation has not been validated in all clinical situations. eGFR's persistently <90 mL/min signify possible Chronic Kidney Disease.    Anion gap 16 (H) 5 - 15  CBC with Differential     Status: Abnormal   Collection Time: 07/26/14  4:08 PM  Result Value Ref Range   WBC 6.0 4.0 - 10.5 K/uL   RBC 3.70 (L) 4.22 - 5.81 MIL/uL   Hemoglobin 11.9 (L) 13.0 - 17.0 g/dL  HCT 34.3 (L) 39.0 - 52.0 %   MCV 92.7 78.0 - 100.0 fL   MCH 32.2 26.0 - 34.0 pg   MCHC 34.7 30.0 - 36.0 g/dL   RDW 02.3 34.3 - 56.8 %   Platelets 128 (L) 150 - 400 K/uL   Neutrophils Relative % 72 43 - 77 %   Neutro Abs 4.4 1.7 - 7.7 K/uL   Lymphocytes Relative 17 12 - 46 %   Lymphs Abs 1.0 0.7 - 4.0 K/uL   Monocytes Relative 10 3 - 12 %   Monocytes Absolute 0.6 0.1 - 1.0 K/uL   Eosinophils Relative 1 0 - 5 %   Eosinophils Absolute 0.0 0.0 - 0.7 K/uL   Basophils Relative 0 0 - 1 %   Basophils Absolute 0.0 0.0 - 0.1 K/uL  Pro b natriuretic peptide (BNP)     Status: Abnormal   Collection Time: 07/26/14  4:08 PM  Result Value Ref Range    Pro B Natriuretic peptide (BNP) 2011.0 (H) 0 - 450 pg/mL  I-stat troponin, ED (not at [facility])     Status: None   Collection Time: 07/26/14  4:23 PM  Result Value Ref Range   Troponin i, poc 0.01 0.00 - 0.08 ng/mL   Comment 3            Comment: Due to the release kinetics of cTnI, a negative result within the first hours of the onset of symptoms does not rule out myocardial infarction with certainty. If myocardial infarction is still suspected, repeat the test at appropriate intervals.    Dg Chest Portable 1 View  07/26/2014   CLINICAL DATA:  Shortness of breath for 1 week. Right shoulder pain. Initial encounter.  EXAM: PORTABLE CHEST - 1 VIEW  COMPARISON:  Radiographs 01/23/2014 and 01/21/2014.  FINDINGS: 1731 hr. Left subclavian AICD leads and epicardial leads appear grossly unchanged. There is stable cardiomegaly status post median sternotomy and CABG. The lungs are clear. There is no pleural effusion or pneumothorax. No acute osseous findings are evident. The subacromial space of the right shoulder is obliterated consistent with a chronic rotator cuff tear. There are glenohumeral degenerative changes bilaterally.  IMPRESSION: No active cardiopulmonary process. Findings at the right shoulder consistent with chronic rotator cuff tear and glenohumeral arthropathy.   Electronically Signed   By: Roxy Horseman M.D.   On: 07/26/2014 17:48    Review of Systems  Constitutional: Positive for malaise/fatigue. Negative for fever and chills.  Eyes: Negative for blurred vision, double vision and photophobia.  Respiratory: Positive for shortness of breath. Negative for cough, hemoptysis and sputum production.   Cardiovascular: Negative for chest pain, palpitations, orthopnea, claudication and leg swelling.  Gastrointestinal: Negative for nausea, vomiting and abdominal pain.  Genitourinary: Negative for dysuria.  Neurological: Positive for weakness. Negative for dizziness and tingling.    Blood  pressure 144/79, pulse 37, temperature 97.8 F (36.6 C), resp. rate 24, SpO2 100 %. Physical Exam  Constitutional: He is oriented to person, place, and time.  HENT:  Head: Normocephalic and atraumatic.  Eyes: Conjunctivae are normal. Pupils are equal, round, and reactive to light. Left eye exhibits no discharge. No scleral icterus.  Neck: Normal range of motion. Neck supple. No JVD present. No tracheal deviation present. No thyromegaly present.  Cardiovascular:  Irregularly irregular S1  and S2 soft. There is soft systolic murmur and S3 gallop noted  Respiratory:  Decreased breath sound at bases with faint rales  GI: Soft. Bowel sounds are normal.  He exhibits no distension. There is no tenderness. There is no rebound.  Musculoskeletal: He exhibits no edema or tenderness.  Neurological: He is alert and oriented to person, place, and time.     Assessment/Plan Acute decompensated systolic heart failure Probable new onset A. fib  CAD status post CABG status post PCI to left circumflex in the past Ischemic cardiomyopathy status post ICD in the past History of complete heart block status post CRT-D upgrade in May 2015 Hypertension Diabetes mellitus Hypercholesteremia History of nonsustained VT in the past History of Gouty arthritis Degenerative joint disease Plan As per orders We will get EP consultation in a.m. For ICD interrogation and further management Check 2-D echo   Clent Demark 07/26/2014, 6:28 PM

## 2014-07-26 NOTE — ED Notes (Signed)
Cards MD at bedside

## 2014-07-26 NOTE — Progress Notes (Signed)
ANTICOAGULATION CONSULT NOTE - Initial Consult  Pharmacy Consult for Heparin Indication: atrial fibrillation  No Known Allergies  Patient Measurements: Height: 5' (152.4 cm) Weight: 143 lb 8.3 oz (65.1 kg) IBW/kg (Calculated) : 50 Heparin Dosing Weight: 63 kg  Vital Signs: Temp: 97.8 F (36.6 C) (11/21 1558) BP: 124/55 mmHg (11/21 1900) Pulse Rate: 64 (11/21 1900)  Labs:  Recent Labs  07/26/14 1608  HGB 11.9*  HCT 34.3*  PLT 128*  CREATININE 1.38*    Estimated Creatinine Clearance: 31 mL/min (by C-G formula based on Cr of 1.38).   Medical History: Past Medical History  Diagnosis Date  . CAD (coronary artery disease)     s/p CABG; s/p Pacemaker  . Systolic CHF with reduced left ventricular function, NYHA class 2   . Ischemic cardiomyopathy     s/p ICD  . Hypertension   . Hyperlipidemia   . Diabetes mellitus   . Gout   . Paroxysmal ventricular tachycardia     Assessment: 31 YOF who presented to the [facility] on 11/21 with SOB, irregular EKG, and mild CHF exacerbation. Pharmacy consulted to start heparin for irregular EKG >> new onset Afib. Hep Wt: 63 kg. Baseline CBC: Hgb 11.9, plts 128. No hx CVA noted, recent ICD upgrade done in May '15, on ticagrelor + ASA PTA.   Goal of Therapy:  Heparin level 0.3-0.7 units/ml Monitor platelets by anticoagulation protocol: Yes   Plan:  1. Heparin bolus of 3000 units x 1 2. Initiate heparin drip at a rate of 850 units/hr (8.5 ml/hr) 3. Daily heparin levels 4. Will continue to monitor for any signs/symptoms of bleeding and will follow up with heparin level in 8 hours   Alycia Rossetti, PharmD, BCPS Clinical Pharmacist Pager: [phone] 07/26/2014 8:29 PM

## 2014-07-26 NOTE — ED Provider Notes (Signed)
CSN: [id number]     Arrival date & time 07/26/14  1552 History   First MD Initiated Contact with Patient 07/26/14 1606     Chief Complaint  Patient presents with  . Shortness of Breath  . Shoulder Pain     (Consider location/radiation/quality/duration/timing/severity/associated sxs/prior Treatment) Patient is a 80 y.o. male presenting with shortness of breath and shoulder pain. The history is provided by the patient.  Shortness of Breath Severity:  Moderate Onset quality:  Gradual Duration:  3 days Timing:  Constant Progression:  Worsening Chronicity:  New Context: not URI   Relieved by: exertion. Worsened by:  Nothing tried Associated symptoms: no abdominal pain, no chest pain, no cough, no fever and no vomiting   Risk factors: no hx of PE/DVT   Shoulder Pain Associated symptoms: no fever     Past Medical History  Diagnosis Date  . CAD (coronary artery disease)     s/p CABG; s/p Pacemaker  . Systolic CHF with reduced left ventricular function, NYHA class 2   . Ischemic cardiomyopathy     s/p ICD  . Hypertension   . Hyperlipidemia   . Diabetes mellitus   . Gout   . Paroxysmal ventricular tachycardia    Past Surgical History  Procedure Laterality Date  . Coronary artery bypass graft    . Cardiac defibrillator placement      generator change 10/12/11  . Bowel resection    . Coronary angioplasty with stent placement    . Bi-ventricular implantable cardioverter defibrillator upgrade  01-22-2014    upgrade of previously implanted single chamber ICD to MDT CRTD by Dr [name] with new atrial lead placement - epicardial LV leads used   History reviewed. No pertinent family history. History  Substance Use Topics  . Smoking status: Current Some Day Smoker    Types: Pipe, Cigars  . Smokeless tobacco: Not on file     Comment: every "now and then"  . Alcohol Use: No     Comment: occasional    Review of Systems  Constitutional: Negative for fever.  Respiratory: Positive  for shortness of breath. Negative for cough.   Cardiovascular: Negative for chest pain.  Gastrointestinal: Negative for vomiting and abdominal pain.  All other systems reviewed and are negative.     Allergies  Review of patient's allergies indicates no known allergies.  Home Medications   Prior to Admission medications   Medication Sig Start Date End Date Taking? Authorizing Provider  allopurinol (ZYLOPRIM) 300 MG tablet Take 300 mg by mouth daily.     Yes Historical Provider, MD  aspirin 81 MG tablet Take 81 mg by mouth daily.     Yes Historical Provider, MD  atorvastatin (LIPITOR) 20 MG tablet Take 20 mg by mouth daily.     Yes Historical Provider, MD  carvedilol (COREG) 6.25 MG tablet Take 3.125 mg by mouth 2 (two) times daily.    Yes Historical Provider, MD  docusate sodium (COLACE) 100 MG capsule Take 1 capsule (100 mg total) by mouth every 12 (twelve) hours. 01/07/14  Yes Peter S Dammen, PA-C  escitalopram (LEXAPRO) 10 MG tablet Take 10 mg by mouth daily.  07/11/14  Yes Historical Provider, MD  famotidine (PEPCID) 20 MG tablet Take 20 mg by mouth 2 (two) times daily.     Yes Historical Provider, MD  PATADAY 0.2 % SOLN Place 1 drop into both eyes daily. 01/14/14  Yes Historical Provider, MD  ramipril (ALTACE) 2.5 MG capsule Take 2.5 mg by  mouth daily.    Yes Historical Provider, MD  sucralfate (CARAFATE) 1 G tablet Take 1 g by mouth 3 (three) times daily.    Yes Historical Provider, MD  Ticagrelor (BRILINTA) 90 MG TABS tablet Take 90 mg by mouth 2 (two) times daily.     Yes Historical Provider, MD  NITROSTAT 0.4 MG SL tablet Place 0.4 mg under the tongue every 5 (five) minutes as needed for chest pain.  12/05/12   Historical Provider, MD   BP 128/79 mmHg  Pulse 79  Temp(Src) 97.8 F (36.6 C)  Resp 18  SpO2 100% Physical Exam  Constitutional: He is oriented to person, place, and time. He appears well-developed and well-nourished. No distress.  HENT:  Head: Normocephalic and  atraumatic.  Mouth/Throat: No oropharyngeal exudate.  Eyes: EOM are normal. Pupils are equal, round, and reactive to light.  Neck: Normal range of motion. Neck supple.  Cardiovascular: Normal rate.  An irregularly irregular rhythm present. Exam reveals no friction rub.   No murmur heard. Pulmonary/Chest: Effort normal and breath sounds normal. No respiratory distress. He has no wheezes. He has no rales.  Abdominal: He exhibits no distension. There is no tenderness. There is no rebound.  Musculoskeletal: Normal range of motion. He exhibits no edema.  Neurological: He is alert and oriented to person, place, and time.  Skin: He is not diaphoretic.    ED Course  Procedures (including critical care time) Labs Review Labs Reviewed  CBC WITH DIFFERENTIAL - Abnormal; Notable for the following:    RBC 3.70 (*)    Hemoglobin 11.9 (*)    HCT 34.3 (*)    Platelets 128 (*)    All other components within normal limits  BASIC METABOLIC PANEL  PRO B NATRIURETIC PEPTIDE  I-STAT TROPOININ, ED    Imaging Review No results found.   EKG Interpretation   Date/Time:  Saturday July 26 2014 15:59:52 EST Ventricular Rate:  28 PR Interval:  210 QRS Duration: 164 QT Interval:  462 QTC Calculation: 314 R Axis:   -12 Text Interpretation:  Irregularly paced Non-specific intra-ventricular  conduction block Inferior infarct , age undetermined Abnormal ECG No prior  similar to this EKG Confirmed by Mingo Amber  MD, [place] ([id number]) on 07/26/2014  4:13:09 PM      MDM   Final diagnoses:  Shortness of breath    80 year old male here with progressive shortness of breath for the past 3 days. No chest pain. Is having some right shoulder pain, he thinks could be gout. No fevers, chest pain, nausea or vomiting. On his EKG has an irregularly paced rhythm. He does not have an irregularly paced EKG to compare this to. We'll check labs, chest x-ray. Will speak with his cardiologist, Dr. [name]. Labs  unremarkable. Dr. [name] evaluated and will admit patient.    Evelina Bucy, MD 07/26/14 [phone]

## 2014-07-26 NOTE — ED Notes (Signed)
Pt c/o right shoulder pain with SOB x several days; pt labored at present

## 2014-07-27 LAB — BASIC METABOLIC PANEL
Anion gap: 12 (ref 5–15)
BUN: 24 mg/dL — AB (ref 6–23)
CO2: 22 mEq/L (ref 19–32)
Calcium: 8.8 mg/dL (ref 8.4–10.5)
Chloride: 103 mEq/L (ref 96–112)
Creatinine, Ser: 1.29 mg/dL (ref 0.50–1.35)
GFR calc Af Amer: 57 mL/min — ABNORMAL LOW (ref >90)
GFR, EST NON AFRICAN AMERICAN: 49 mL/min — AB (ref >90)
GLUCOSE: 122 mg/dL — AB (ref 70–99)
Potassium: 4 mEq/L (ref 3.7–5.3)
Sodium: 137 mEq/L (ref 137–147)

## 2014-07-27 LAB — TROPONIN I: Troponin I: <0.3 ng/mL (ref <0.30)

## 2014-07-27 LAB — HEPARIN LEVEL (UNFRACTIONATED): Heparin Unfractionated: 0.65 IU/mL (ref 0.30–0.70)

## 2014-07-27 MED ORDER — RAMIPRIL 1.25 MG PO CAPS
1.2500 mg | ORAL_CAPSULE | Freq: Every day | ORAL | Status: DC
  Administered 2014-07-28 – 2014-07-30 (×3): 1.25 mg via ORAL
  Filled 2014-07-27 (×3): qty 1

## 2014-07-27 NOTE — Plan of Care (Signed)
Problem: Consults Goal: Diabetes Guidelines if Diabetic/Glucose > 140 If diabetic or lab glucose is > 140 mg/dl - Initiate Diabetes/Hyperglycemia Guidelines & Document Interventions  Outcome: Not Applicable Date Met:  07/27/14     

## 2014-07-27 NOTE — Progress Notes (Signed)
  Echocardiogram 2D Echocardiogram has been performed.  [name] 07/27/2014, 12:01 PM

## 2014-07-27 NOTE — Progress Notes (Signed)
Subjective:  Patient denies any chest pain states breathing has improved. Blood pressure remains in 80s to low 90s. Denies any dizziness or lightheadedness. Paced rhythm on the monitor. Occasional episodes of marked bradycardia.  Objective:  Vital Signs in the last 24 hours: Temp:  [97.2 F (36.2 C)-97.9 F (36.6 C)] 97.3 F (36.3 C) (11/22 0834) Pulse Rate:  [37-81] 62 (11/22 0417) Resp:  [11-28] 20 (11/22 0417) BP: (82-144)/(46-81) 82/48 mmHg (11/22 1018) SpO2:  [96 %-100 %] 100 % (11/22 0834) Weight:  [65.1 kg (143 lb 8.3 oz)] 65.1 kg (143 lb 8.3 oz) (11/21 2002)  Intake/Output from previous day: 11/21 0701 - 11/22 0700 In: -  Out: 375 [Urine:375] Intake/Output from this shift:    Physical Exam: Neck: no adenopathy, no carotid bruit, no JVD and supple, symmetrical, trachea midline Lungs: Decreased breath sound at bases Heart: regular rate and rhythm, S1, S2 normal and Soft systolic murmur and S3 gallop noted Abdomen: soft, non-tender; bowel sounds normal; no masses,  no organomegaly Extremities: extremities normal, atraumatic, no cyanosis or edema  Lab Results:  Recent Labs  07/26/14 1608  WBC 6.0  HGB 11.9*  PLT 128*    Recent Labs  07/26/14 1608 07/27/14 0315  NA 138 137  K 5.0 4.0  CL 101 103  CO2 21 22  GLUCOSE 197* 122*  BUN 25* 24*  CREATININE 1.38* 1.29    Recent Labs  07/27/14 0315 07/27/14 0802  TROPONINI <0.30 <0.30   Hepatic Function Panel No results for input(s): PROT, ALBUMIN, AST, ALT, ALKPHOS, BILITOT, BILIDIR, IBILI in the last 72 hours. No results for input(s): CHOL in the last 72 hours. No results for input(s): PROTIME in the last 72 hours.  Imaging: Imaging results have been reviewed  Cardiac Studies:  Assessment/Plan:  Resolving Acute decompensated systolic heart failure rule out ischemia Probable new onset A. fib  CAD status post CABG status post PCI to left circumflex in the past Ischemic cardiomyopathy status post ICD  in the past History of complete heart block status post CRT-D upgrade in May 2015 Hypertension Diabetes mellitus Hypercholesteremia History of nonsustained VT in the past History of Gouty arthritis Degenerative joint disease Plan Check 2-D echo EP consult Will schedule for nuclear stress test in a.m.  LOS: 1 day    [name] N 07/27/2014, 11:13 AM

## 2014-07-27 NOTE — Progress Notes (Addendum)
Mr Duris is followed by [facility] hosp[facility]  his RN is Quita Skye & office # is [phone]

## 2014-07-27 NOTE — Progress Notes (Signed)
ANTICOAGULATION CONSULT NOTE - Follow Up Consult  Pharmacy Consult for Heparin Indication: atrial fibrillation  No Known Allergies  Patient Measurements: Height: 5' (152.4 cm) Weight: 143 lb 8.3 oz (65.1 kg) IBW/kg (Calculated) : 50 Heparin Dosing Weight: 65 kg  Vital Signs: Temp: 97.3 F (36.3 C) (11/22 0834) Temp Source: Oral (11/22 0834) BP: 90/50 mmHg (11/22 0834) Pulse Rate: 62 (11/22 0417)  Labs:  Recent Labs  07/26/14 1608 07/26/14 2025 07/27/14 0315  HGB 11.9*  --   --   HCT 34.3*  --   --   PLT 128*  --   --   HEPARINUNFRC  --   --  0.65  CREATININE 1.38*  --  1.29  TROPONINI  --  <0.30 <0.30    Estimated Creatinine Clearance: 33.2 mL/min (by C-G formula based on Cr of 1.29).  Assessment:   Initial heparin level is therapeutic (0.65) on 850 units/hr.   No CBC today.  Baseline platelet count is low, but consistent with prior values.  On Aspirin 81 mg;  off home Brilinta.  Goal of Therapy:  Heparin level 0.3-0.7 units/ml Monitor platelets by anticoagulation protocol: Yes   Plan:    Continue heparin drip at 850 units/hr.   Next heparin level and CBC in am; watch platelet count.  Arty Baumgartner, [place] Pager: [phone] 07/27/2014,8:43 AM

## 2014-07-27 NOTE — Plan of Care (Signed)
Problem: Consults Goal: Skin Care Protocol Initiated - if Braden Score 18 or less If consults are not indicated, leave blank or document N/A  Outcome: Not Applicable Date Met:  75/79/72 Goal: Tobacco Cessation referral if indicated Outcome: Not Applicable Date Met:  82/06/01 Goal: Nutrition Consult-if indicated Outcome: Completed/Met Date Met:  07/27/14  Problem: Phase I Progression Outcomes Goal: Dyspnea controlled at rest (HF) Outcome: Completed/Met Date Met:  07/27/14 Goal: Pain controlled with appropriate interventions Outcome: Completed/Met Date Met:  07/27/14 Goal: Voiding-avoid urinary catheter unless indicated Outcome: Completed/Met Date Met:  07/27/14 Goal: Other Phase I Outcomes/Goals Outcome: Not Applicable Date Met:  56/15/37

## 2014-07-28 ENCOUNTER — Inpatient Hospital Stay (HOSPITAL_COMMUNITY): Payer: Medicare Other

## 2014-07-28 DIAGNOSIS — Z95810 Presence of automatic (implantable) cardiac defibrillator: Secondary | ICD-10-CM

## 2014-07-28 DIAGNOSIS — I483 Typical atrial flutter: Secondary | ICD-10-CM

## 2014-07-28 LAB — BASIC METABOLIC PANEL
Anion gap: 13 (ref 5–15)
BUN: 25 mg/dL — AB (ref 6–23)
CALCIUM: 8.9 mg/dL (ref 8.4–10.5)
CO2: 19 mEq/L (ref 19–32)
Chloride: 105 mEq/L (ref 96–112)
Creatinine, Ser: 1.35 mg/dL (ref 0.50–1.35)
GFR, EST AFRICAN AMERICAN: 54 mL/min — AB (ref >90)
GFR, EST NON AFRICAN AMERICAN: 46 mL/min — AB (ref >90)
Glucose, Bld: 99 mg/dL (ref 70–99)
Potassium: 4 mEq/L (ref 3.7–5.3)
Sodium: 137 mEq/L (ref 137–147)

## 2014-07-28 LAB — CBC
HEMATOCRIT: 32.1 % — AB (ref 39.0–52.0)
Hemoglobin: 11.4 g/dL — ABNORMAL LOW (ref 13.0–17.0)
MCH: 32.6 pg (ref 26.0–34.0)
MCHC: 35.5 g/dL (ref 30.0–36.0)
MCV: 91.7 fL (ref 78.0–100.0)
Platelets: 128 10*3/uL — ABNORMAL LOW (ref 150–400)
RBC: 3.5 MIL/uL — ABNORMAL LOW (ref 4.22–5.81)
RDW: 12.9 % (ref 11.5–15.5)
WBC: 2.9 10*3/uL — ABNORMAL LOW (ref 4.0–10.5)

## 2014-07-28 LAB — HEPARIN LEVEL (UNFRACTIONATED): Heparin Unfractionated: 0.44 IU/mL (ref 0.30–0.70)

## 2014-07-28 MED ORDER — REGADENOSON 0.4 MG/5ML IV SOLN
INTRAVENOUS | Status: AC
  Administered 2014-07-28: 0.4 mg via INTRAVENOUS
  Filled 2014-07-28: qty 5

## 2014-07-28 MED ORDER — REGADENOSON 0.4 MG/5ML IV SOLN
0.4000 mg | Freq: Once | INTRAVENOUS | Status: AC
  Administered 2014-07-28: 0.4 mg via INTRAVENOUS
  Filled 2014-07-28: qty 5

## 2014-07-28 MED ORDER — TECHNETIUM TC 99M SESTAMIBI GENERIC - CARDIOLITE
30.0000 | Freq: Once | INTRAVENOUS | Status: AC | PRN
  Administered 2014-07-28: 30 via INTRAVENOUS

## 2014-07-28 MED ORDER — TECHNETIUM TC 99M SESTAMIBI GENERIC - CARDIOLITE
10.0000 | Freq: Once | INTRAVENOUS | Status: AC | PRN
  Administered 2014-07-28: 10 via INTRAVENOUS

## 2014-07-28 NOTE — Progress Notes (Addendum)
ANTICOAGULATION CONSULT NOTE - Follow Up Consult  Pharmacy Consult for Heparin Indication: atrial fibrillation  No Known Allergies  Patient Measurements: Height: 5' (152.4 cm) Weight: 143 lb 4.8 oz (65 kg) IBW/kg (Calculated) : 50 Heparin Dosing Weight: 65 kg  Vital Signs: Temp: 97.3 F (36.3 C) (11/23 0801) Temp Source: Oral (11/23 0801) BP: 103/60 mmHg (11/23 0944) Pulse Rate: 77 (11/23 0350)  Labs:  Recent Labs  07/26/14 1608 07/26/14 2025 07/27/14 0315 07/27/14 0802 07/28/14 0400  HGB 11.9*  --   --   --  11.4*  HCT 34.3*  --   --   --  32.1*  PLT 128*  --   --   --  128*  HEPARINUNFRC  --   --  0.65  --  0.44  CREATININE 1.38*  --  1.29  --  1.35  TROPONINI  --  <0.30 <0.30 <0.30  --     Estimated Creatinine Clearance: 31.7 mL/min (by C-G formula based on Cr of 1.35).  Assessment: SOB, fatigue 80 yo F who [facility] with SOB, irregular EKG, and mild CHF exacerbation. Pharmacy consulted to start heparin for irregular EKG >> new onset Afib. Hep Wt: 63 kg. Baseline CBC: Hgb 11.9, plts 128. No hx CVA noted, recent ICD upgrade done in May '15, on ticagrelor + ASA PTA.   Anticoagulation - New afib. Heparin level 0.44 in goal. CBC stable. Watch thrombocytopenia. Plts 128  Infectious Disease - afb, WBC 2.9, no abx  Cardiovascular - CAD, prior MI, CABG (1995), PCI 2012. ICM, hx NSVT, has ICD (upgrade May 2015), HTN, HLD, 103/60, HR 77. EP to see for ICD interrogation. Lexiscan stress test 11/23. F/u results. EF 20-25% on Echo 11.22 Meds: asa 81, lipito 20, coreg, ramipril, spirono.  Endocrinology - hx DM noted, but no meds pta. glucose 99, TSH ok, hx gouty arthriitis. on home allopurinol  Gastrointestinal / Nutrition - pepcid po, carafate, docusate  Neurology - hx CVA. lexapro  Nephrology - Scr 1.35  Pulmonary  RA  Hematology / Oncology -CBC stable.  PTA Medication Issues: off home Brilinta and Pataday eye gtts  Best Practices: IV hep,  H2  Goal of Therapy:  Heparin level 0.3-0.7 units/ml Monitor platelets by anticoagulation protocol: Yes   Plan:  F/u stress test results. Continue Iv heparin at 850 units/hr Daily HL and CBC. Continue to hold Brilinta per phone call to Dr. [name].   Ammanda Dobbins S. Alford Highland, PharmD, [facility] Clinical Staff Pharmacist Pager [phone]  Eilene Ghazi Stillinger 07/28/2014,11:11 AM

## 2014-07-28 NOTE — Progress Notes (Signed)
Utilization Review Completed.  

## 2014-07-28 NOTE — Progress Notes (Signed)
INITIAL NUTRITION ASSESSMENT  DOCUMENTATION CODES Per approved criteria  -Not Applicable   INTERVENTION: No nutrition intervention at this time --- patient declined RD to follow for nutrition care plan  NUTRITION DIAGNOSIS: Inadequate oral intake related to poor appetite as evidenced by pt report  Goal: Pt to meet >/= 90% of their estimated nutrition needs   Monitor:  PO & supplemental intake, weight, labs, I/O's  Reason for Assessment: Consult  80 y.o. male  Admitting Dx: shortness of breath   ASSESSMENT: 80 yo Male with PMH for CAD, HTN, DM and hypercholesteremia; came to ER complaining of progressive shortness of breath associated with feeling weak, fatigue and no energy for the last few days.  Patient reports he has no appetite; noted with Hospice services; PO intake variable at 25-50% per flowsheet records; he has "cases" of Ensure supplements at home and occasionally drinks them, however, does not want during hospitalization; endorses he lost weight ~ 1 year ago; recently weight has been stable.  No muscle or subcutaneous fat depletion noticed.  Height: Ht Readings from Last 1 Encounters:  07/28/14 5' (1.524 m)    Weight: Wt Readings from Last 1 Encounters:  07/28/14 143 lb 4.8 oz (65 kg)    Ideal Body Weight: 106 lb  % Ideal Body Weight: 135%  Wt Readings from Last 10 Encounters:  07/28/14 143 lb 4.8 oz (65 kg)  03/11/14 145 lb 6.4 oz (65.953 kg)  01/27/14 154 lb 1.6 oz (69.899 kg)  02/15/13 140 lb 12.8 oz (63.866 kg)  02/12/13 142 lb 6.4 oz (64.592 kg)  06/22/12 157 lb (71.215 kg)  02/25/12 159 lb (72.122 kg)  02/02/12 152 lb (68.947 kg)  10/13/11 156 lb 6.4 oz (70.943 kg)  09/28/11 160 lb (72.576 kg)    Usual Body Weight: 142 lb  % Usual Body Weight: 98%  BMI:  Body mass index is 27.99 kg/(m^2).  Estimated Nutritional Needs: Kcal: 1600-1800 Protein: 80-90 gm Fluid: 1.6-1.8 L  Skin: Intact  Diet Order: Diet Heart  EDUCATION NEEDS: -No  education needs identified at this time   Intake/Output Summary (Last 24 hours) at 07/28/14 1436 Last data filed at 07/28/14 1400  Gross per 24 hour  Intake 646.14 ml  Output   1525 ml  Net -878.86 ml    Labs:   Recent Labs Lab 07/26/14 1608 07/26/14 2025 07/27/14 0315 07/28/14 0400  NA 138  --  137 137  K 5.0  --  4.0 4.0  CL 101  --  103 105  CO2 21  --  22 19  BUN 25*  --  24* 25*  CREATININE 1.38*  --  1.29 1.35  CALCIUM 9.6  --  8.8 8.9  MG  --  2.0  --   --   GLUCOSE 197*  --  122* 99    Scheduled Meds: . allopurinol  300 mg Oral Daily  . aspirin  81 mg Oral Daily  . atorvastatin  20 mg Oral QHS  . carvedilol  3.125 mg Oral BID  . docusate sodium  100 mg Oral Q12H  . escitalopram  10 mg Oral Daily  . famotidine  20 mg Oral BID  . ramipril  1.25 mg Oral Daily  . sodium chloride  3 mL Intravenous Q12H  . spironolactone  12.5 mg Oral Daily  . sucralfate  1 g Oral TID    Continuous Infusions: . heparin 850 Units/hr (07/27/14 2300)    Past Medical History  Diagnosis Date  .  CAD (coronary artery disease)     s/p CABG; s/p Pacemaker  . Systolic CHF with reduced left ventricular function, NYHA class 2   . Ischemic cardiomyopathy     s/p ICD  . Hypertension   . Hyperlipidemia   . Diabetes mellitus   . Gout   . Paroxysmal ventricular tachycardia     Past Surgical History  Procedure Laterality Date  . Coronary artery bypass graft    . Cardiac defibrillator placement      generator change 10/12/11  . Bowel resection    . Coronary angioplasty with stent placement    . Bi-ventricular implantable cardioverter defibrillator upgrade  01-22-2014    upgrade of previously implanted single chamber ICD to MDT CRTD by Dr [name] with new atrial lead placement - epicardial LV leads used    Arthur Holms, RD, LDN Pager #: [phone] After-Hours Pager #: [phone]

## 2014-07-28 NOTE — Progress Notes (Signed)
Subjective:  Patient seen in nuclear medicine department. Tolerated Lexiscan stress test. Denies any chest pain states breathing has improved. Myoview result still pending  Objective:  Vital Signs in the last 24 hours: Temp:  [97.3 F (36.3 C)-97.8 F (36.6 C)] 97.3 F (36.3 C) (11/23 0801) Pulse Rate:  [61-90] 77 (11/23 0350) Resp:  [13-24] 21 (11/23 0350) BP: (101-122)/(50-79) 103/60 mmHg (11/23 0944) SpO2:  [97 %-100 %] 97 % (11/23 0350) Weight:  [65 kg (143 lb 4.8 oz)] 65 kg (143 lb 4.8 oz) (11/23 0350)  Intake/Output from previous day: 11/22 0701 - 11/23 0700 In: 816.1 [P.O.:680; I.V.:136.1] Out: 1150 [Urine:1150] Intake/Output from this shift:    Physical Exam: Neck: no adenopathy, no carotid bruit, no JVD and supple, symmetrical, trachea midline Lungs: Decreased breath sound at bases Heart: regular rate and rhythm, S1, S2 normal and Soft systolic murmur and S3 gallop noted Abdomen: soft, non-tender; bowel sounds normal; no masses,  no organomegaly Extremities: extremities normal, atraumatic, no cyanosis or edema  Lab Results:  Recent Labs  07/26/14 1608 07/28/14 0400  WBC 6.0 2.9*  HGB 11.9* 11.4*  PLT 128* 128*    Recent Labs  07/27/14 0315 07/28/14 0400  NA 137 137  K 4.0 4.0  CL 103 105  CO2 22 19  GLUCOSE 122* 99  BUN 24* 25*  CREATININE 1.29 1.35    Recent Labs  07/27/14 0315 07/27/14 0802  TROPONINI <0.30 <0.30   Hepatic Function Panel No results for input(s): PROT, ALBUMIN, AST, ALT, ALKPHOS, BILITOT, BILIDIR, IBILI in the last 72 hours. No results for input(s): CHOL in the last 72 hours. No results for input(s): PROTIME in the last 72 hours.  Imaging: Imaging results have been reviewed and Dg Chest Portable 1 View  07/26/2014   CLINICAL DATA:  Shortness of breath for 1 week. Right shoulder pain. Initial encounter.  EXAM: PORTABLE CHEST - 1 VIEW  COMPARISON:  Radiographs 01/23/2014 and 01/21/2014.  FINDINGS: 1731 hr. Left subclavian  AICD leads and epicardial leads appear grossly unchanged. There is stable cardiomegaly status post median sternotomy and CABG. The lungs are clear. There is no pleural effusion or pneumothorax. No acute osseous findings are evident. The subacromial space of the right shoulder is obliterated consistent with a chronic rotator cuff tear. There are glenohumeral degenerative changes bilaterally.  IMPRESSION: No active cardiopulmonary process. Findings at the right shoulder consistent with chronic rotator cuff tear and glenohumeral arthropathy.   Electronically Signed   By: Camie Patience M.D.   On: 07/26/2014 17:48    Cardiac Studies:  Assessment/Plan:  Resolving Acute decompensated systolic heart failure rule out ischemia Probable new onset A. fib  CAD status post CABG status post PCI to left circumflex in the past Ischemic cardiomyopathy status post ICD in the past History of complete heart block status post CRT-D upgrade in May 2015 Hypertension Diabetes mellitus Hypercholesteremia History of nonsustained VT in the past History of Gouty arthritis Degenerative joint disease Plan Continue present management Awaiting EP consult Check Lexiscan Myoview results  LOS: 2 days    Nusrat Encarnacion N 07/28/2014, 10:51 AM

## 2014-07-29 ENCOUNTER — Encounter (HOSPITAL_COMMUNITY): Payer: Self-pay | Admitting: *Deleted

## 2014-07-29 LAB — CBC
HCT: 29.5 % — ABNORMAL LOW (ref 39.0–52.0)
Hemoglobin: 10.3 g/dL — ABNORMAL LOW (ref 13.0–17.0)
MCH: 32.6 pg (ref 26.0–34.0)
MCHC: 34.9 g/dL (ref 30.0–36.0)
MCV: 93.4 fL (ref 78.0–100.0)
Platelets: 127 10*3/uL — ABNORMAL LOW (ref 150–400)
RBC: 3.16 MIL/uL — AB (ref 4.22–5.81)
RDW: 12.8 % (ref 11.5–15.5)
WBC: 2.3 10*3/uL — ABNORMAL LOW (ref 4.0–10.5)

## 2014-07-29 LAB — BASIC METABOLIC PANEL
Anion gap: 13 (ref 5–15)
BUN: 20 mg/dL (ref 6–23)
CALCIUM: 8.9 mg/dL (ref 8.4–10.5)
CHLORIDE: 103 meq/L (ref 96–112)
CO2: 21 mEq/L (ref 19–32)
Creatinine, Ser: 1.22 mg/dL (ref 0.50–1.35)
GFR calc Af Amer: 61 mL/min — ABNORMAL LOW (ref >90)
GFR, EST NON AFRICAN AMERICAN: 52 mL/min — AB (ref >90)
Glucose, Bld: 114 mg/dL — ABNORMAL HIGH (ref 70–99)
Potassium: 4.1 mEq/L (ref 3.7–5.3)
Sodium: 137 mEq/L (ref 137–147)

## 2014-07-29 LAB — HEPARIN LEVEL (UNFRACTIONATED): Heparin Unfractionated: 0.52 IU/mL (ref 0.30–0.70)

## 2014-07-29 MED ORDER — RIVAROXABAN 15 MG PO TABS
15.0000 mg | ORAL_TABLET | Freq: Every day | ORAL | Status: DC
  Administered 2014-07-29 – 2014-07-30 (×2): 15 mg via ORAL
  Filled 2014-07-29 (×2): qty 1

## 2014-07-29 NOTE — Care Management Note (Addendum)
    Page 1 of 1   07/30/2014     4:12:29 PM CARE MANAGEMENT NOTE 07/30/2014  Patient:  [name], [name]   Account Number:  [id number]  Date Initiated:  07/29/2014  Documentation initiated by:  Babette Relic  Subjective/Objective Assessment:   dx systolic failure; lives alone      Anticipated DC Date:  07/30/2014   Anticipated DC Plan:  [place]  CM consult      [facility]  HOSPICE   [place] arranged  HH-1 RN  [place] PT      [place]   Status of service:  Completed, signed off Medicare Important Message given?  YES (If response is "NO", the following Medicare IM given date fields will be blank) Date Medicare IM given:  07/29/2014 Medicare IM given by:  Armany Mano Date Additional Medicare IM given:   Additional Medicare IM given by:    Discharge Disposition:  [place]  Per UR Regulation:  Reviewed for med. necessity/level of care/duration of stay  Comments:  07/30/14 [place] MSN BSN CCM Provided card for 30-day supply of Xarelto.  Pt to see Dr [name] in one week and will f/u with prior auth at that time per RN.  Provided discharge information to  [facility].  RN has talked with POA who will transport pt home.  07/29/14 1019 [place] MSN BSN CCM Pt is active with [facility] for hospice services.  Talked with Erline Hau [phone]), pt care coordinator re anticipated discharge 11/25.  Noted PT/OT evals pending - per Lorriane Shire, PT services will be provided if pt needs follow-up.  Also talked with neighbor and POA, Tonie Griffith [phone], [phone]), who requests TC when time of discharge is known tomorrow.  H&[facility] and [facility] summary [facility] [phone]) when completed and hospice nurse will visit same day.

## 2014-07-29 NOTE — Consult Note (Addendum)
Reason for Consult:Optimization of ICD and evaluate atrial arrhythmias  Referring Physician: Dr. Quay Burow [name] is an 80 y.o. male.   HPI: The patient is a very pleasant 80 year old man with a long-standing history of an ischemic cardiomyopathy, chronic systolic heart failure, hypertension, diabetes, who is status post biventricular ICD upgrade in May 2015. He was admitted to the hospital with worsening congestive heart failure symptoms. He notes that approximately 10 days prior to admission, the patient was noted to be more short of breath which gradually progressed. He has been treated with intravenous diuretics and his symptoms are improved. He was noted to be Out of rhythm. He denies any ICD shock. No syncope. He does have peripheral edema. PMH: Past Medical History  Diagnosis Date  . CAD (coronary artery disease)     s/p CABG; s/p Pacemaker  . Systolic CHF with reduced left ventricular function, NYHA class 2   . Ischemic cardiomyopathy     s/p ICD  . Hypertension   . Hyperlipidemia   . Diabetes mellitus   . Gout   . Paroxysmal ventricular tachycardia     PSHX: Past Surgical History  Procedure Laterality Date  . Coronary artery bypass graft    . Cardiac defibrillator placement      generator change 10/12/11  . Bowel resection    . Coronary angioplasty with stent placement    . Bi-ventricular implantable cardioverter defibrillator upgrade  01-22-2014    upgrade of previously implanted single chamber ICD to MDT CRTD by Dr [name] with new atrial lead placement - epicardial LV leads used    FAMHX:History reviewed. No pertinent family history. no premature coronary disease.  Social History:  reports that he has been smoking Pipe and Cigars.  He does not have any smokeless tobacco history on file. He reports that he does not drink alcohol or use illicit drugs.  Allergies: No Known Allergies  Medications: Reviewed.  Nm Myocar Multi W/spect W/wall Motion /  Ef  07/28/2014   CLINICAL DATA:  Chest pain. Previous myocardial infarct. Shortness of breath. Diabetes and hypertension.  EXAM: MYOCARDIAL IMAGING WITH SPECT (REST AND PHARMACOLOGIC-STRESS)  GATED LEFT VENTRICULAR WALL MOTION STUDY  LEFT VENTRICULAR EJECTION FRACTION  TECHNIQUE: Standard myocardial SPECT imaging was performed after resting intravenous injection of 10 mCi Tc-51m sestamibi. Subsequently, intravenous infusion of Lexiscan was performed under the supervision of the Cardiology staff. At peak effect of the drug, 30 mCi Tc-44m sestamibi was injected intravenously and standard myocardial SPECT imaging was performed. Quantitative gated imaging was also performed to evaluate left ventricular wall motion, and estimate left ventricular ejection fraction.  COMPARISON:  06/29/2009  FINDINGS: Perfusion: A large fixed myocardial perfusion defect is seen on both stress and rest images involving the inferior and inferolateral walls, consistent with myocardial scar. No definite reversible defects seen to suggest the presence of reversible ischemia.  Wall Motion: Marked inferior wall hypokinesis. Mild to moderate left ventricular dilatation also noted.  Left Ventricular Ejection Fraction: 37 %  End diastolic volume 720 ml  End systolic volume 83 ml  IMPRESSION: 1. Large myocardial scar involving the inferior and inferolateral walls. No definite evidence of reversible ischemia.  2. Marked inferior wall hypokinesis and mild to moderate left ventricular dilatation.  3. Left ventricular ejection fraction 37%  4. High-risk stress test findings*.  *2012 Appropriate Use Criteria for Coronary Revascularization Focused Update: J Am Coll Cardiol. 9470;96(2):836-629. http://content.airportbarriers.com.aspx?articleid=1201161   Electronically Signed   By: Earle Gell M.D.   On:  07/28/2014 15:29    ROS  As stated in the HPI and negative for all other systems.  Physical Exam  Vitals:Blood pressure 109/54, pulse 61,  temperature 98.2 F (36.8 C), temperature source Oral, resp. rate 11, height 5' (1.524 m), weight 142 lb 6.7 oz (64.6 kg), SpO2 100 %.  Well appearing 80 year old man, NAD HEENT: Unremarkable Neck:  7 cm JVD, no thyromegally Lymphatics:  No adenopathy Back:  No CVA tenderness Lungs:  Clear except for scattered basilar rales. HEART:  Regular rate rhythm, no murmurs, no rubs, no clicks Abd:  Flat, positive bowel sounds, no organomegally, no rebound, no guarding Ext:  2 plus pulses, no edema, no cyanosis, no clubbing Skin:  No rashes no nodules Neuro:  CN II through XII intact, motor grossly intact  ECG - atrial flutter with a controlled ventricular response and biventricular pacing  Telemetry - atrial flutter with ventricular pacing  Assessment/Plan: 1. Acute on chronic systolic heart failure 2. New onset atrial flutter with a controlled ventricular response 3. Ischemic cardiomyopathy, status post MI remotely 4. Status post biventricular ICD implantation  Recommendation: We'll obtain device interrogation. He will need systemic anticoagulation. Now that his heart failure is improved, he may be discharged home, and I will see him in follow-up. After he has been anticoagulated for 3 weeks, we'll plan to consider catheter ablation of his atrial flutter.  Cristopher Peru, M.D.

## 2014-07-29 NOTE — Progress Notes (Signed)
Subjective:  Patient denies any chest pain or shortness of breath states overall feels tired and weak and does not feel strong enough going home today  Objective:  Vital Signs in the last 24 hours: Temp:  [97.4 F (36.3 C)-98.4 F (36.9 C)] 98.2 F (36.8 C) (11/24 0440) Pulse Rate:  [59-81] 62 (11/24 0815) Resp:  [11-22] 18 (11/24 0815) BP: (101-120)/(50-82) 116/57 mmHg (11/24 0815) SpO2:  [96 %-100 %] 99 % (11/24 0815) Weight:  [64.6 kg (142 lb 6.7 oz)] 64.6 kg (142 lb 6.7 oz) (11/24 0441)  Intake/Output from previous day: 11/23 0701 - 11/24 0700 In: 518 [P.O.:345; I.V.:173] Out: 1000 [Urine:1000] Intake/Output from this shift:    Physical Exam: Neck: no adenopathy, no carotid bruit, no JVD and supple, symmetrical, trachea midline Lungs: Decreased breath sound at bases Heart: regular rate and rhythm, S1, S2 normal and Soft systolic murmur and S3 gallop noted Abdomen: soft, non-tender; bowel sounds normal; no masses,  no organomegaly Extremities: extremities normal, atraumatic, no cyanosis or edema  Lab Results:  Recent Labs  07/28/14 0400 07/29/14 0235  WBC 2.9* 2.3*  HGB 11.4* 10.3*  PLT 128* 127*    Recent Labs  07/28/14 0400 07/29/14 0235  NA 137 137  K 4.0 4.1  CL 105 103  CO2 19 21  GLUCOSE 99 114*  BUN 25* 20  CREATININE 1.35 1.22    Recent Labs  07/27/14 0315 07/27/14 0802  TROPONINI <0.30 <0.30   Hepatic Function Panel No results for input(s): PROT, ALBUMIN, AST, ALT, ALKPHOS, BILITOT, BILIDIR, IBILI in the last 72 hours. No results for input(s): CHOL in the last 72 hours. No results for input(s): PROTIME in the last 72 hours.  Imaging: Imaging results have been reviewed and Nm Myocar Multi W/spect W/wall Motion / Ef  07/28/2014   CLINICAL DATA:  Chest pain. Previous myocardial infarct. Shortness of breath. Diabetes and hypertension.  EXAM: MYOCARDIAL IMAGING WITH SPECT (REST AND PHARMACOLOGIC-STRESS)  GATED LEFT VENTRICULAR WALL MOTION  STUDY  LEFT VENTRICULAR EJECTION FRACTION  TECHNIQUE: Standard myocardial SPECT imaging was performed after resting intravenous injection of 10 mCi Tc-75m sestamibi. Subsequently, intravenous infusion of Lexiscan was performed under the supervision of the Cardiology staff. At peak effect of the drug, 30 mCi Tc-40m sestamibi was injected intravenously and standard myocardial SPECT imaging was performed. Quantitative gated imaging was also performed to evaluate left ventricular wall motion, and estimate left ventricular ejection fraction.  COMPARISON:  06/29/2009  FINDINGS: Perfusion: A large fixed myocardial perfusion defect is seen on both stress and rest images involving the inferior and inferolateral walls, consistent with myocardial scar. No definite reversible defects seen to suggest the presence of reversible ischemia.  Wall Motion: Marked inferior wall hypokinesis. Mild to moderate left ventricular dilatation also noted.  Left Ventricular Ejection Fraction: 37 %  End diastolic volume 161 ml  End systolic volume 83 ml  IMPRESSION: 1. Large myocardial scar involving the inferior and inferolateral walls. No definite evidence of reversible ischemia.  2. Marked inferior wall hypokinesis and mild to moderate left ventricular dilatation.  3. Left ventricular ejection fraction 37%  4. High-risk stress test findings*.  *2012 Appropriate Use Criteria for Coronary Revascularization Focused Update: J Am Coll Cardiol. 0960;45(4):098-119. http://content.airportbarriers.com.aspx?articleid=1201161   Electronically Signed   By: Earle Gell M.D.   On: 07/28/2014 15:29    Cardiac Studies:  Assessment/Plan:  Resolving Acute decompensated systolic heart failure negative nuclear stress test New-onset atrial flutter with controlled ventricular response pacemaker dependent CAD status post  CABG status post PCI to left circumflex in the past Ischemic cardiomyopathy status post ICD in the past History of complete heart  block status post CRT-D upgrade in May 2015 Hypertension Diabetes mellitus Hypercholesteremia History of nonsustained VT in the past History of Gouty arthritis Degenerative joint disease Plan DC heparin Start Xarelto as per orders We'll be scheduled for flutter ablation after 3 weeks. ICD interrogation today OT PT consult Possible discharge tomorrow Social service for discharge planning  LOS: 3 days    Karlee Staff N 07/29/2014, 8:18 AM

## 2014-07-29 NOTE — Clinical Social Work Note (Signed)
Clinical Social Work Department BRIEF PSYCHOSOCIAL ASSESSMENT 07/29/2014  Patient:  [name], [name]     Account Number:  [id number]     Admit date:  07/26/2014  Clinical Social Worker:  Domenica Reamer, [place]  Date/Time:  07/29/2014 01:16 PM  Referred by:  Physician  Date Referred:  07/29/2014 Referred for  SNF Placement   Other Referral:   Interview type:  Patient Other interview type:    PSYCHOSOCIAL DATA Living Status:  ALONE Admitted from facility:   Level of care:   Primary support name:  Thamas Jaegers Primary support relationship to patient:  FRIEND Degree of support available:   Patient reports high level of support from his neighbor, [name], who checks on him everyday and who acts as his POA.    CURRENT CONCERNS Current Concerns  Post-Acute Placement   Other Concerns:    SOCIAL WORK ASSESSMENT / PLAN CSW spoke with patient concerning Dr. recommendation for SNF services.  Patient stated that he does not want to go to a SNF and feels as if he will be strong enough to go home tomorrow.  Patient is being seen by home hospice and will continue to be followed when he discharges from the hospital.   Assessment/plan status:  Psychosocial Support/Ongoing Assessment of Needs Other assessment/ plan:   none   Information/referral to community resources:   none    PATIENT'S/FAMILY'S RESPONSE TO PLAN OF CARE: Patient is not agreeable to SNF placement- he feels as if he will be strong enough to go home tomorrow and would not want to be in a SNF for more than a day or so.       CSW signing off.  Domenica Reamer, [place] Social Worker [phone] ]

## 2014-07-29 NOTE — Progress Notes (Addendum)
ANTICOAGULATION CONSULT NOTE - Follow Up Consult  Pharmacy Consult for Heparin Indication: atrial fibrillation  No Known Allergies  Patient Measurements: Height: 5' (152.4 cm) Weight: 142 lb 6.7 oz (64.6 kg) IBW/kg (Calculated) : 50 Heparin Dosing Weight: 65 kg  Vital Signs: Temp: 98.2 F (36.8 C) (11/24 0440) Temp Source: Oral (11/24 0440) BP: 109/54 mmHg (11/24 0400) Pulse Rate: 61 (11/24 0400)  Labs:  Recent Labs  07/26/14 1608 07/26/14 2025 07/27/14 0315 07/27/14 0802 07/28/14 0400 07/29/14 0235  HGB 11.9*  --   --   --  11.4* 10.3*  HCT 34.3*  --   --   --  32.1* 29.5*  PLT 128*  --   --   --  128* 127*  HEPARINUNFRC  --   --  0.65  --  0.44 0.52  CREATININE 1.38*  --  1.29  --  1.35 1.22  TROPONINI  --  <0.30 <0.30 <0.30  --   --     Estimated Creatinine Clearance: 34.9 mL/min (by C-G formula based on Cr of 1.22).  Assessment: SOB, fatigue 80 yo F who [facility] the [facility] with SOB, irregular EKG, and mild CHF exacerbation. Pharmacy consulted to start heparin for irregular EKG >> new onset Afib. Hep Wt: 63 kg. Baseline CBC: Hgb 11.9, plts 128. No hx CVA noted, recent ICD upgrade done in May '15, on ticagrelor + ASA PTA.   Anticoagulation - New afib. Heparin level 0.5 in goal. Hgb trending down but no bleeding issues noted. Watch thrombocytopenia. Plts 127 (stable)  Stress test results pending review.  chads2vasc= 5  Goal of Therapy:  Heparin level 0.3-0.7 units/ml Monitor platelets by anticoagulation protocol: Yes   Plan:  F/u stress test results. Continue Iv heparin at 850 units/hr Daily HL and CBC. Continue to hold Brilinta for now per Marcene Corning PharmD., BCPS Clinical Pharmacist Pager [phone] 07/29/2014 7:52 AM  Addum:  D/c heparin and start xarelto.  CrCl ~35 ml/min.  Will start xarelto 15 mg po daily.  Will need education.

## 2014-07-30 LAB — CBC
HEMATOCRIT: 30.3 % — AB (ref 39.0–52.0)
Hemoglobin: 10.5 g/dL — ABNORMAL LOW (ref 13.0–17.0)
MCH: 31.7 pg (ref 26.0–34.0)
MCHC: 34.7 g/dL (ref 30.0–36.0)
MCV: 91.5 fL (ref 78.0–100.0)
Platelets: 139 10*3/uL — ABNORMAL LOW (ref 150–400)
RBC: 3.31 MIL/uL — ABNORMAL LOW (ref 4.22–5.81)
RDW: 12.8 % (ref 11.5–15.5)
WBC: 2.3 10*3/uL — ABNORMAL LOW (ref 4.0–10.5)

## 2014-07-30 MED ORDER — SPIRONOLACTONE 12.5 MG HALF TABLET: 12.5000 mg | ORAL_TABLET | Freq: Every day | ORAL | Status: DC

## 2014-07-30 MED ORDER — RIVAROXABAN 15 MG PO TABS: 15.0000 mg | ORAL_TABLET | Freq: Every day | ORAL | Status: DC

## 2014-07-30 NOTE — Plan of Care (Signed)
Problem: Discharge Progression Outcomes Goal: Hemodynamically stable Outcome: Completed/Met Date Met:  07/30/14 Goal: Complications resolved/controlled Outcome: Adequate for Discharge Goal: Tolerating diet Outcome: Completed/Met Date Met:  07/30/14 Goal: Activity appropriate for discharge plan Outcome: Completed/Met Date Met:  07/30/14 Goal: Other Discharge Outcomes/Goals Outcome: Completed/Met Date Met:  07/30/14     

## 2014-07-30 NOTE — Evaluation (Signed)
Physical Therapy Evaluation Patient Details Name: [name] MRN: [id number] DOB: [birth year] Today's Date: 07/30/2014   History of Present Illness  Patient is an 80 y/o male admitted with SOB and fatigue.  PMH positive for CAD, h/o MI s/p CABG, CHF, ischemic cardiomyopathy, DM, HTN, NSVT, gout, and ICD with CRT-D upgrade May 2015.  Clinical Impression  Patient presents close to functional baseline, however using walker today and has used cane in past.  Informed pt to use walker at home for safest mobility and fall prevention.  States feels better due to breathing is better.  Will benefit from HHPT for safety and in home mobility progressing back to cane safely as available through Hospice services.    Follow Up Recommendations Home health PT    Equipment Recommendations  None recommended by PT    Recommendations for Other Services       Precautions / Restrictions Precautions Precautions: Fall      Mobility  Bed Mobility               General bed mobility comments: pt up in chair  Transfers Overall transfer level: Modified independent Equipment used: Rolling walker (2 wheeled)                Ambulation/Gait Ambulation/Gait assistance: Supervision Ambulation Distance (Feet): 200 Feet Assistive device: Rolling walker (2 wheeled) Gait Pattern/deviations: Step-through pattern;Decreased stride length     General Gait Details: increased time for turns, but no instability noted, even with environmental scanning and talking with staff in hallway  Stairs            Wheelchair Mobility    Modified Rankin (Stroke Patients Only)       Balance Overall balance assessment: Needs assistance   Sitting balance-Leahy Scale: Good       Standing balance-Leahy Scale: Fair Standing balance comment: stood at sink x 7-8 minutes to clean dentures with intermittet UE support and intermittent supervision as PT leaving room to get supplies.  No SOB, LOB or  distress noted                             Pertinent Vitals/Pain Pain Assessment: No/denies pain    Home Living Family/patient expects to be discharged to:: Private residence Living Arrangements: Alone Available Help at Discharge: Friend(s);Available PRN/intermittently Type of Home: House Home Access: Level entry     Home Layout: One level Home Equipment: Walker - 2 wheels;Wheelchair - Regulatory affairs officer - single point;Bedside commode;Grab bars - tub/shower;Hospital bed      Prior Function Level of Independence: Independent with assistive device(s)         Comments: has meals on wheels, has assistance for bills and transportation     Hand Dominance        Extremity/Trunk Assessment   Upper Extremity Assessment: Defer to OT evaluation           Lower Extremity Assessment: Overall WFL for tasks assessed      Cervical / Trunk Assessment: Kyphotic  Communication   Communication: No difficulties  Cognition Arousal/Alertness: Awake/alert Behavior During Therapy: WFL for tasks assessed/performed Overall Cognitive Status: Within Functional Limits for tasks assessed                      General Comments      Exercises        Assessment/Plan    PT Assessment Patient needs continued PT services  PT Diagnosis  Abnormality of gait;Generalized weakness   PT Problem List Decreased mobility;Decreased balance;Decreased activity tolerance;Decreased strength  PT Treatment Interventions DME instruction;Balance training;Gait training;Functional mobility training;Patient/family education;Therapeutic activities;Therapeutic exercise   PT Goals (Current goals can be found in the Care Plan section) Acute Rehab PT Goals Patient Stated Goal: To go home today PT Goal Formulation: With patient Time For Goal Achievement: 08/06/14 Potential to Achieve Goals: Good    Frequency Min 3X/week   Barriers to discharge Decreased caregiver support       Co-evaluation               End of Session Equipment Utilized During Treatment: Gait belt Activity Tolerance: Patient tolerated treatment well Patient left: in chair;with call bell/phone within reach;with nursing/sitter in room           Time: 6440-3474 PT Time Calculation (min) (ACUTE ONLY): 24 min   Charges:   PT Evaluation $Initial PT Evaluation Tier I: 1 Procedure PT Treatments $Gait Training: 8-22 mins   PT G Codes:          WYNN,CYNDI 2014-08-08, 9:53 AM Magda Kiel, [place] 08-08-2014

## 2014-07-30 NOTE — Discharge Summary (Signed)
[name], [name] NO.:  [id number]  MEDICAL RECORD NO.:  [id number]  LOCATION:  2C14C                        [facility]  PHYSICIAN:  Allegra Lai. [name], M.D. DATE OF BIRTH:  1926/11/24  DATE OF ADMISSION:  07/26/2014 DATE OF DISCHARGE:  07/30/2014                              DISCHARGE SUMMARY   ADMITTING DIAGNOSES: 1. Acute decompensated systolic heart failure. 2. Probable new-onset atrial fibrillation/flutter. 3. Coronary artery disease, status post coronary artery bypass graft,     status post percutaneous coronary intervention to left circumflex     in the past. 4. Ischemic cardiomyopathy, status post ICD in the past. 5. History of complete heart block, status post CRT-D upgrade in May     of 2015. 6. Hypertension. 7. Diabetes mellitus. 8. Hypercholesteremia. 9. History of nonsustained ventricular tachycardia in the past. 10.History of gouty arthritis. 11.Degenerative joint disease.  DISCHARGE DIAGNOSES: 1. Compensated systolic heart failure, negative nuclear Lexiscan     Myoview, with no evidence of ischemia, ejection fraction improved     to 37%. 2. Status post atrial fibrillation, now in flutter with controlled     ventricular response with paced rhythm. 3. Coronary artery disease, status post coronary artery bypass graft,     status post percutaneous coronary intervention to left circumflex     in the past, stable. 4. Ischemic cardiomyopathy, status post ICD in the past. 5. History of complete heart block status post CRT-D upgrade in May     2015. 6. Hypertension. 7. Diabetes mellitus. 8. Hypercholesteremia. 9. History of nonsustained ventricular tachycardia in the past. 10.History of gouty arthritis. 11.Degenerative joint disease.  DISCHARGE HOME MEDICATIONS: 1. Xarelto 15 mg 1 tablet daily. 2. Aldactone 12.5 mg daily. 3. Allopurinol 300 mg daily. 4. Aspirin 81 mg daily. 5. Atorvastatin 20 mg daily. 6. Carvedilol 6.25 mg twice  daily. 7. Colace 100 mg daily. 8. Lexapro 10 mg daily. 9. Pepcid 20 mg twice daily. 10.Nitrostat sublingual, use as directed. 11.Ramipril 2.5 mg 1 capsule daily. 12.Carafate 1 g 3 times daily. 13.The patient has been advised to stop Brilinta.  DIET:  Low-salt, low-cholesterol, 1800 calories ADA diet.  FOLLOWUP:  The patient will follow up with Dr. [name] in few weeks for consideration for possible catheter ablation for atrial flutter.  Follow up with me in 1 week.  CONDITION AT DISCHARGE:  Stable.  BRIEF HISTORY:  [name] is 80 year old male with past medical history significant for multiple medical problems, i.e., coronary artery disease, history of inferior wall myocardial infarction in 1994, subsequently had CABG, status post PCI to left circumflex in August of 2012, ischemic cardiomyopathy, status post ICD in the past, status post complete heart block requiring CRT-D upgrade in May of 2015, hypertension, diabetes mellitus, hypercholesteremia, history of nonsustained VT in the past, and gouty arthritis, he came to the ER, complaining of progressive increasing shortness of breath associated with feeling weak, tired, fatigue, and no energy for last few days.  The patient denies any palpitations, lightheadedness, or syncope.  Denies any ICD discharges.  Denies any chest pain, nausea, vomiting, diaphoresis, although his activity is limited.  Denies PND, orthopnea, leg swelling.  The patient was noted to  have irregularly irregular heart rhythm on the monitor which was new and also noted to have mild congestive heart failure.  PHYSICAL EXAMINATION:  VITAL SIGNS:  His blood pressure was 144/79, pulse was 37, temperature was 97.8. EYES:  Conjunctivae were pink. NECK:  Supple.  No JVD. LUNGS:  Decreased breath sounds at bases with faint rales. CARDIOVASCULAR:  Irregularly irregular.  S1, S2 was soft.  There was soft systolic murmur and S3 gallop. ABDOMEN:  Soft.  Bowel sounds  were present.  Nontender. EXTREMITIES:  There was no clubbing, cyanosis, or edema.  LABORATORY DATA:  Sodium was 138, potassium 5.0, BUN 25, creatinine 1.38.  Hemoglobin was 11.9, hematocrit 34.3, white count of 6.0.  ProBNP was 2011.  Three sets of cardiac enzymes were negative.  Lexiscan Myoview showed large myocardial scar involving inferior and inferolateral wall, no definite evidence of reversible ischemia with EF of 37%.  EKG showed ventricular paced rhythm, with underlying atrial flutter.  BRIEF HOSPITAL COURSE:  The patient was admitted to telemetry step-down unit, MI was ruled out by serial enzymes and EKG.  The patient was started on low-dose spironolactone with good diuresis and improvement in his breathing, the patient subsequently underwent nuclear stress test which showed no evidence of ischemia, his EF has improved to 37% by nuclear stress test, 2D echo showed EF approximately 25-30%.  EP consultation was called.  The patient's Brilinta was discontinued and was switched to novel oral anticoagulant, Xarelto which he is tolerating it well.  The patient will be scheduled for catheter ablation after 3-4 weeks of anticoagulation as outpatient.  The patient discussed regarding skilled nursing facility which he refused and wanted to go home.  The patient will be discharged home on the above medications and will be followed up in my office in 1 week.  We will arrange for OT and PT consult as outpatient.     Allegra Lai. [name], M.D.     MNH/MEDQ  D:  07/30/2014  T:  07/30/2014  Job:  [id number]

## 2014-07-30 NOTE — Plan of Care (Signed)
Problem: Discharge Progression Outcomes Goal: Barriers To Progression Addressed/Resolved Outcome: Completed/Met Date Met:  07/30/14 Goal: Able to perform self care activities Outcome: Adequate for Discharge Goal: Discharge plan in place and appropriate Outcome: Adequate for Discharge Goal: Pain controlled with appropriate interventions Outcome: Completed/Met Date Met:  07/30/14

## 2014-07-30 NOTE — Evaluation (Signed)
Occupational Therapy Evaluation and Discharge Patient Details Name: [name] MRN: [id number] DOB: 10/26/1926 Today's Date: 07/30/2014    History of Present Illness Patient is an 80 y/o male admitted with SOB and fatigue.  PMH positive for CAD, h/o MI s/p CABG, CHF, ischemic cardiomyopathy, DM, HTN, NSVT, gout, and ICD with CRT-D upgrade May 2015.   Clinical Impression   This 80 yo male admitted with above presents to acute OT at A S level (will have intermittent S level at home) and he is ready to go home per his report. Recommend follow up [place] to make sure he is managing his BADLs and IADLs at home successfully and independently. Acute OT will D/C.    Follow Up Recommendations  Home health OT    Equipment Recommendations  None recommended by OT       Precautions / Restrictions Precautions Precautions: Fall Restrictions Weight Bearing Restrictions: No      Mobility Bed Mobility               General bed mobility comments: pt up in chair  Transfers Overall transfer level: Modified independent Equipment used: None                  Balance Overall balance assessment: Needs assistance   Sitting balance-Leahy Scale: Good       Standing balance-Leahy Scale: Fair Standing balance comment: stood at sink x 7-8 minutes to clean dentures with intermittet UE support and intermittent supervision as PT leaving room to get supplies.  No SOB, LOB or distress noted                            ADL Overall ADL's : Needs assistance/impaired Eating/Feeding: Independent;Sitting   Grooming: Supervision/safety;Standing   Upper Body Bathing: Supervision/ safety;Sitting   Lower Body Bathing: Supervison/ safety;Sit to/from stand   Upper Body Dressing : Supervision/safety;Sitting   Lower Body Dressing: Supervision/safety;Sit to/from stand   Toilet Transfer: Supervision/safety;Ambulation;Regular Toilet;Grab bars   Toileting- Clothing Manipulation and  Hygiene: Supervision/safety;Sit to/from stand   Tub/ Shower Transfer: Supervision/safety;Ambulation;Grab bars (none)                     Pertinent Vitals/Pain Pain Assessment: No/denies pain     Hand Dominance Right   Extremity/Trunk Assessment Upper Extremity Assessment Upper Extremity Assessment: RUE deficits/detail;LUE deficits/detail RUE Deficits / Details: Decreased A/PROM of shoulders (pre-exsisting per pt due to gouty arthritis) RUE Coordination: decreased gross motor LUE Deficits / Details: Decreased A/PROM of shoulders (pre-exsisting per pt due to gouty arthritis) LUE Coordination: decreased gross motor       Communication Communication Communication: No difficulties   Cognition Arousal/Alertness: Awake/alert Behavior During Therapy: WFL for tasks assessed/performed Overall Cognitive Status: Within Functional Limits for tasks assessed                                Home Living Family/patient expects to be discharged to:: Private residence Living Arrangements: Alone Available Help at Discharge: Friend(s);Available PRN/intermittently Type of Home: House Home Access: Level entry     Home Layout: One level     Bathroom Shower/Tub: Tub/shower unit;Curtain Shower/tub characteristics: Architectural technologist: Standard     Home Equipment: Environmental consultant - 2 wheels;Wheelchair - Regulatory affairs officer - single point;Bedside commode;Grab bars - tub/shower;Hospital bed          Prior Functioning/Environment Level of Independence: Independent with  assistive device(s)        Comments: has meals on wheels, has assistance for bills and transportation    OT Diagnosis: Generalized weakness   OT Problem List: Impaired balance (sitting and/or standing)   OT Treatment/Interventions: Self-care/ADL training;Patient/family education;Balance training;DME and/or AE instruction    OT Goals(Current goals can be found in the care plan section) Acute Rehab OT  Goals Patient Stated Goal: to go home today  OT Frequency:                End of Session    Activity Tolerance: Patient tolerated treatment well Patient left: in chair;with call bell/phone within reach   Time: 0932-0947 OT Time Calculation (min): 15 min Charges:  OT General Charges $OT Visit: 1 Procedure OT Evaluation $Initial OT Evaluation Tier I: 1 Procedure OT Treatments $Self Care/Home Management : 8-22 mins  Almon Register [phone] 07/30/2014, 11:30 AM

## 2014-07-30 NOTE — Discharge Instructions (Signed)
Information on my medicine - XARELTO (Rivaroxaban)  This medication education was reviewed with me or my healthcare representative as part of my discharge preparation.  The pharmacist that spoke with me during my hospital stay was:  Beverlee Nims, [facility]  Why was Xarelto prescribed for you? Xarelto was prescribed for you to reduce the risk of a blood clot forming that can cause a stroke if you have a medical condition called atrial fibrillation (a type of irregular heartbeat).  What do you need to know about xarelto ? Take your Xarelto ONCE DAILY at the same time every day with your evening meal. If you have difficulty swallowing the tablet whole, you may crush it and mix in applesauce just prior to taking your dose.  Take Xarelto exactly as prescribed by your doctor and DO NOT stop taking Xarelto without talking to the doctor who prescribed the medication.  Stopping without other stroke prevention medication to take the place of Xarelto may increase your risk of developing a clot that causes a stroke.  Refill your prescription before you run out.  After discharge, you should have regular check-up appointments with your healthcare provider that is prescribing your Xarelto.  In the future your dose may need to be changed if your kidney function or weight changes by a significant amount.  What do you do if you miss a dose? If you are taking Xarelto ONCE DAILY and you miss a dose, take it as soon as you remember on the same day then continue your regularly scheduled once daily regimen the next day. Do not take two doses of Xarelto at the same time or on the same day.   Important Safety Information A possible side effect of Xarelto is bleeding. You should call your healthcare provider right away if you experience any of the following: ? Bleeding from an injury or your nose that does not stop. ? Unusual colored urine (red or dark brown) or unusual colored stools (red or  black). ? Unusual bruising for unknown reasons. ? A serious fall or if you hit your head (even if there is no bleeding).  Some medicines may interact with Xarelto and might increase your risk of bleeding while on Xarelto. To help avoid this, consult your healthcare provider or pharmacist prior to using any new prescription or non-prescription medications, including herbals, vitamins, non-steroidal anti-inflammatory drugs (NSAIDs) and supplements.  Heart Failure Heart failure means your heart has trouble pumping blood. This makes it hard for your body to work well. Heart failure is usually a long-term (chronic) condition. You must take good care of yourself and follow your doctor's treatment plan. HOME CARE  Take your heart medicine as told by your doctor.  Do not stop taking medicine unless your doctor tells you to.  Do not skip any dose of medicine.  Refill your medicines before they run out.  Take other medicines only as told by your doctor or pharmacist.  Stay active if told by your doctor. The elderly and people with severe heart failure should talk with a doctor about physical activity.  Eat heart-healthy foods. Choose foods that are without trans fat and are low in saturated fat, cholesterol, and salt (sodium). This includes fresh or frozen fruits and vegetables, fish, lean meats, fat-free or low-fat dairy foods, whole grains, and high-fiber foods. Lentils and dried peas and beans (legumes) are also good choices.  Limit salt if told by your doctor.  Cook in a healthy way. Roast, grill, broil, bake, poach,  steam, or stir-fry foods.  Limit fluids as told by your doctor.  Weigh yourself every morning. Do this after you pee (urinate) and before you eat breakfast. Write down your weight to give to your doctor.  Take your blood pressure and write it down if your doctor tells you to.  Ask your doctor how to check your pulse. Check your pulse as told.  Lose weight if told by your  doctor.  Stop smoking or chewing tobacco. Do not use gum or patches that help you quit without your doctor's approval.  Schedule and go to doctor visits as told.  Nonpregnant women should have no more than 1 drink a day. Men should have no more than 2 drinks a day. Talk to your doctor about drinking alcohol.  Stop illegal drug use.  Stay current with shots (immunizations).  Manage your health conditions as told by your doctor.  Learn to manage your stress.  Rest when you are tired.  If it is really hot outside:  Avoid intense activities.  Use air conditioning or fans, or get in a cooler place.  Avoid caffeine and alcohol.  Wear loose-fitting, lightweight, and light-colored clothing.  If it is really cold outside:  Avoid intense activities.  Layer your clothing.  Wear mittens or gloves, a hat, and a scarf when going outside.  Avoid alcohol.  Learn about heart failure and get support as needed.  Get help to maintain or improve your quality of life and your ability to care for yourself as needed. GET HELP IF:   You gain 03 lb/1.4 kg or more in 1 day or 05 lb/2.3 kg in a week.  You are more short of breath than usual.  You cannot do your normal activities.  You tire easily.  You cough more than normal, especially with activity.  You have any or more puffiness (swelling) in areas such as your hands, feet, ankles, or belly (abdomen).  You cannot sleep because it is hard to breathe.  You feel like your heart is beating fast (palpitations).  You get dizzy or light-headed when you stand up. GET HELP RIGHT AWAY IF:   You have trouble breathing.  There is a change in mental status, such as becoming less alert or not being able to focus.  You have chest pain or discomfort.  You faint. MAKE SURE YOU:   Understand these instructions.  Will watch your condition.  Will get help right away if you are not doing well or get worse. Document Released: 05/31/2008  Document Revised: 01/06/2014 Document Reviewed: 10/08/2012 [facility] Patient Information 2015 [place], [place]. This information is not intended to replace advice given to you by your health care provider. Make sure you discuss any questions you have with your health care provider.  This website has more information on Xarelto: [email or web address]

## 2014-07-30 NOTE — Progress Notes (Signed)
07/30/2014 Patient discharge at 1700 to home, when over discharge patient with friend. Did write on AVS when medication was last given. Patient was alert, oriented and ambulatory. Patient left floor via wheelchair. [facility] RN.

## 2014-07-30 NOTE — Discharge Summary (Signed)
Discharge summary dictated on 07/30/2014 dictation number is [phone]

## 2014-08-01 DIAGNOSIS — I5040 Unspecified combined systolic (congestive) and diastolic (congestive) heart failure: Secondary | ICD-10-CM | POA: Diagnosis not present

## 2014-08-05 DIAGNOSIS — I5040 Unspecified combined systolic (congestive) and diastolic (congestive) heart failure: Secondary | ICD-10-CM | POA: Diagnosis not present

## 2014-08-12 DIAGNOSIS — E785 Hyperlipidemia, unspecified: Secondary | ICD-10-CM | POA: Diagnosis not present

## 2014-08-12 DIAGNOSIS — I482 Chronic atrial fibrillation: Secondary | ICD-10-CM | POA: Diagnosis not present

## 2014-08-12 DIAGNOSIS — I2510 Atherosclerotic heart disease of native coronary artery without angina pectoris: Secondary | ICD-10-CM | POA: Diagnosis not present

## 2014-08-12 DIAGNOSIS — I5020 Unspecified systolic (congestive) heart failure: Secondary | ICD-10-CM | POA: Diagnosis not present

## 2014-08-12 DIAGNOSIS — I10 Essential (primary) hypertension: Secondary | ICD-10-CM | POA: Diagnosis not present

## 2014-08-12 DIAGNOSIS — M1990 Unspecified osteoarthritis, unspecified site: Secondary | ICD-10-CM | POA: Diagnosis not present

## 2014-08-12 DIAGNOSIS — I259 Chronic ischemic heart disease, unspecified: Secondary | ICD-10-CM | POA: Diagnosis not present

## 2014-08-14 ENCOUNTER — Encounter (HOSPITAL_COMMUNITY): Payer: Self-pay | Admitting: Internal Medicine

## 2014-08-14 DIAGNOSIS — C61 Malignant neoplasm of prostate: Secondary | ICD-10-CM | POA: Diagnosis not present

## 2014-08-20 ENCOUNTER — Other Ambulatory Visit (HOSPITAL_COMMUNITY): Payer: Self-pay | Admitting: Urology

## 2014-08-20 DIAGNOSIS — C61 Malignant neoplasm of prostate: Secondary | ICD-10-CM

## 2014-09-02 ENCOUNTER — Ambulatory Visit (INDEPENDENT_AMBULATORY_CARE_PROVIDER_SITE_OTHER): Admitting: Internal Medicine

## 2014-09-02 ENCOUNTER — Encounter: Payer: Self-pay | Admitting: Internal Medicine

## 2014-09-02 VITALS — BP 140/90 | HR 80 | Ht 60.0 in | Wt 153.2 lb

## 2014-09-02 DIAGNOSIS — I484 Atypical atrial flutter: Secondary | ICD-10-CM

## 2014-09-02 DIAGNOSIS — I5022 Chronic systolic (congestive) heart failure: Secondary | ICD-10-CM

## 2014-09-02 DIAGNOSIS — I4892 Unspecified atrial flutter: Secondary | ICD-10-CM | POA: Insufficient documentation

## 2014-09-02 DIAGNOSIS — I5021 Acute systolic (congestive) heart failure: Secondary | ICD-10-CM

## 2014-09-02 DIAGNOSIS — Z95810 Presence of automatic (implantable) cardiac defibrillator: Secondary | ICD-10-CM

## 2014-09-02 LAB — MDC_IDC_ENUM_SESS_TYPE_INCLINIC
Battery Remaining Longevity: 86 mo
Battery Voltage: 2.98 V
Brady Statistic AP VP Percent: 0.02 %
Brady Statistic AS VP Percent: 87.11 %
Brady Statistic AS VS Percent: 12.86 %
HIGH POWER IMPEDANCE MEASURED VALUE: 228 Ohm
HighPow Impedance: 32 Ohm
HighPow Impedance: 44 Ohm
Lead Channel Impedance Value: 285 Ohm
Lead Channel Impedance Value: 4047 Ohm
Lead Channel Pacing Threshold Amplitude: 0.625 V
Lead Channel Pacing Threshold Amplitude: 1.375 V
Lead Channel Pacing Threshold Pulse Width: 0.4 ms
Lead Channel Sensing Intrinsic Amplitude: 2.625 mV
Lead Channel Sensing Intrinsic Amplitude: 7.25 mV
Lead Channel Setting Pacing Amplitude: 2 V
Lead Channel Setting Pacing Amplitude: 2.5 V
Lead Channel Setting Pacing Amplitude: 3 V
Lead Channel Setting Pacing Pulse Width: 0.4 ms
Lead Channel Setting Pacing Pulse Width: 0.4 ms
MDC IDC MSMT LEADCHNL LV IMPEDANCE VALUE: 4047 Ohm
MDC IDC MSMT LEADCHNL LV PACING THRESHOLD PULSEWIDTH: 0.4 ms
MDC IDC MSMT LEADCHNL RA IMPEDANCE VALUE: 513 Ohm
MDC IDC MSMT LEADCHNL RA PACING THRESHOLD AMPLITUDE: 0.625 V
MDC IDC MSMT LEADCHNL RA PACING THRESHOLD PULSEWIDTH: 0.4 ms
MDC IDC MSMT LEADCHNL RA SENSING INTR AMPL: 2.375 mV
MDC IDC MSMT LEADCHNL RA SENSING INTR AMPL: 2.5 mV
MDC IDC MSMT LEADCHNL RV IMPEDANCE VALUE: 456 Ohm
MDC IDC SESS DTM: 20151229170254
MDC IDC SET LEADCHNL RV SENSING SENSITIVITY: 0.3 mV
MDC IDC SET ZONE DETECTION INTERVAL: 300 ms
MDC IDC SET ZONE DETECTION INTERVAL: 350 ms
MDC IDC STAT BRADY AP VS PERCENT: 0.01 %
MDC IDC STAT BRADY RA PERCENT PACED: 0.03 %
MDC IDC STAT BRADY RV PERCENT PACED: 86.07 %
Zone Setting Detection Interval: 350 ms
Zone Setting Detection Interval: 450 ms

## 2014-09-02 NOTE — Assessment & Plan Note (Signed)
His Medtronic BiV ICD is working normally. Will recheck in several months. 

## 2014-09-02 NOTE — Assessment & Plan Note (Signed)
His symptoms are well compensated. He will continue his current meds. I considered adding digoxin for improved rate control. Will hold off for now but consider in the future if his pacing percent decreases.

## 2014-09-02 NOTE — Patient Instructions (Signed)
Your physician wants you to follow-up in: 12 months with Dr. Knox Saliva will receive a reminder letter in the mail two months in advance. If you don't receive a letter, please call our office to schedule the follow-up appointment.   Remote monitoring is used to monitor your Pacemaker or ICD from home. This monitoring reduces the number of office visits required to check your device to one time per year. It allows [place] to keep an eye on the functioning of your device to ensure it is working properly. You are scheduled for a device check from home on 12/02/14. You may send your transmission at any time that day. If you have a wireless device, the transmission will be sent automatically. After your physician reviews your transmission, you will receive a postcard with your next transmission date.

## 2014-09-02 NOTE — Assessment & Plan Note (Signed)
He remains in atypical atrial flutter but his rate is well controlled. I considered attempting catheter ablation but the severity of his symptoms, advanced age and likely left atrial location of his flutter make ablation likely to be poorly tolerated, at increased risk and of questionable benefit.

## 2014-09-02 NOTE — Progress Notes (Signed)
HPI [name] returns today for followup. He is a pleasant 80 yo man with a h/o chronic systolic heart failure, CAD, PVC's s/p ICD insertion. He denies chest pain. He was admitted to the hospital several weeks ago with acute CHF and was found to be in atrial flutter. His rate is now controlled. His flutter is atypical in appearance. He is still pacing over 85% of the time.  No syncope or palpitions.  No Known Allergies   Current Outpatient Prescriptions  Medication Sig Dispense Refill  . allopurinol (ZYLOPRIM) 300 MG tablet Take 300 mg by mouth daily.      Marland Kitchen atorvastatin (LIPITOR) 20 MG tablet Take 20 mg by mouth daily.      . carvedilol (COREG) 6.25 MG tablet Take 3.125 mg by mouth 2 (two) times daily.     Marland Kitchen docusate sodium (COLACE) 100 MG capsule Take 1 capsule (100 mg total) by mouth every 12 (twelve) hours. 30 capsule 0  . escitalopram (LEXAPRO) 10 MG tablet Take 10 mg by mouth daily.   0  . famotidine (PEPCID) 20 MG tablet Take 20 mg by mouth 2 (two) times daily.      Marland Kitchen NITROSTAT 0.4 MG SL tablet Place 0.4 mg under the tongue every 5 (five) minutes as needed for chest pain (MAX 3 TABLETS).     Marland Kitchen PATADAY 0.2 % SOLN Place 1 drop into both eyes daily.    Marland Kitchen RA ASPIRIN ADULT LOW STRENGTH 81 MG EC tablet Take 1 tablet by mouth daily.  0  . ramipril (ALTACE) 2.5 MG capsule Take 2.5 mg by mouth daily.     . Rivaroxaban (XARELTO) 15 MG TABS tablet Take 1 tablet (15 mg total) by mouth daily. 30 tablet 3  . spironolactone (ALDACTONE) 12.5 mg TABS tablet Take 0.5 tablets (12.5 mg total) by mouth daily. 30 tablet 3  . sucralfate (CARAFATE) 1 G tablet Take 1 g by mouth 3 (three) times daily.      No current facility-administered medications for this visit.     Past Medical History  Diagnosis Date  . CAD (coronary artery disease)     s/p CABG; s/p Pacemaker  . Systolic CHF with reduced left ventricular function, NYHA class 2   . Ischemic cardiomyopathy     s/p ICD  . Hypertension     . Hyperlipidemia   . Diabetes mellitus   . Gout   . Paroxysmal ventricular tachycardia     ROS:   All systems reviewed and negative except as noted in the HPI.   Past Surgical History  Procedure Laterality Date  . Coronary artery bypass graft    . Cardiac defibrillator placement      generator change 10/12/11  . Bowel resection    . Coronary angioplasty with stent placement    . Bi-ventricular implantable cardioverter defibrillator upgrade  01-22-2014    upgrade of previously implanted single chamber ICD to MDT CRTD by Dr [name] with new atrial lead placement - epicardial LV leads used  . Implantable cardioverter defibrillator (icd) generator change Left 10/12/2011    Procedure: ICD GENERATOR CHANGE;  Surgeon: [name] Lance, MD;  Location[facility] CATH LAB;  Service: Cardiovascular;  Laterality: Left;  . Pacemaker revision N/A 10/12/2011    Procedure: PACEMAKER REVISION;  Surgeon: [name] Lance, MD;  Location: [facility] CATH LAB;  Service: Cardiovascular;  Laterality: N/A;  . Bi-ventricular implantable cardioverter defibrillator upgrade N/A 01/22/2014    Procedure: BI-VENTRICULAR IMPLANTABLE CARDIOVERTER DEFIBRILLATOR  UPGRADE;  Surgeon: [name] Lance, MD;  Location: [facility] CATH LAB;  Service: Cardiovascular;  Laterality: N/A;     No family history on file.   History   Social History  . Marital Status: Divorced    Spouse Name: N/A    Number of Children: N/A  . Years of Education: N/A   Occupational History  . Not on file.   Social History Main Topics  . Smoking status: Current Some Day Smoker    Types: Pipe, Cigars  . Smokeless tobacco: Not on file     Comment: every "now and then"  . Alcohol Use: No     Comment: occasional  . Drug Use: No  . Sexual Activity: Not on file   Other Topics Concern  . Not on file   Social History Narrative     BP 140/90 mmHg  Pulse 80  Ht 5' (1.524 m)  Wt 153 lb 3.2 oz (69.491 kg)  BMI 29.92 kg/m2  Physical Exam:  Well appearing 80 yo man,  NAD HEENT: Unremarkable Neck:  No JVD, no thyromegally Back:  No CVA tenderness Lungs:  Clear with no wheezes HEART:  Regular rate rhythm, no murmurs, no rubs, no clicks Abd:  soft, positive bowel sounds, no organomegally, no rebound, no guarding Ext:  2 plus pulses, no edema, no cyanosis, no clubbing Skin:  No rashes no nodules Neuro:  CN II through XII intact, motor grossly intact  EKG - atrial flutter with BiV pacing  DEVICE  Normal device function.  See PaceArt for details.   Assess/Plan:

## 2014-09-05 DIAGNOSIS — I5040 Unspecified combined systolic (congestive) and diastolic (congestive) heart failure: Secondary | ICD-10-CM | POA: Diagnosis not present

## 2014-09-08 DIAGNOSIS — I5040 Unspecified combined systolic (congestive) and diastolic (congestive) heart failure: Secondary | ICD-10-CM | POA: Diagnosis not present

## 2014-09-11 DIAGNOSIS — I5040 Unspecified combined systolic (congestive) and diastolic (congestive) heart failure: Secondary | ICD-10-CM | POA: Diagnosis not present

## 2014-09-12 DIAGNOSIS — I5040 Unspecified combined systolic (congestive) and diastolic (congestive) heart failure: Secondary | ICD-10-CM | POA: Diagnosis not present

## 2014-09-16 ENCOUNTER — Encounter (HOSPITAL_COMMUNITY)
Admission: RE | Admit: 2014-09-16 | Discharge: 2014-09-16 | Disposition: A | Discharge status: Home / Self Care | Payer: Medicare Other | Source: Ambulatory Visit | Attending: Urology | Admitting: Urology

## 2014-09-16 ENCOUNTER — Ambulatory Visit (HOSPITAL_COMMUNITY)
Admission: RE | Admit: 2014-09-16 | Discharge: 2014-09-16 | Disposition: A | Discharge status: Home / Self Care | Payer: Medicare Other | Source: Ambulatory Visit | Attending: Urology | Admitting: Urology

## 2014-09-16 DIAGNOSIS — Z8546 Personal history of malignant neoplasm of prostate: Secondary | ICD-10-CM | POA: Diagnosis not present

## 2014-09-16 DIAGNOSIS — C61 Malignant neoplasm of prostate: Secondary | ICD-10-CM

## 2014-09-16 DIAGNOSIS — R972 Elevated prostate specific antigen [PSA]: Secondary | ICD-10-CM | POA: Insufficient documentation

## 2014-09-16 MED ORDER — TECHNETIUM TC 99M MEDRONATE IV KIT
26.9000 | PACK | Freq: Once | INTRAVENOUS | Status: AC | PRN
  Administered 2014-09-16: 26.9 via INTRAVENOUS

## 2014-09-17 DIAGNOSIS — I5040 Unspecified combined systolic (congestive) and diastolic (congestive) heart failure: Secondary | ICD-10-CM | POA: Diagnosis not present

## 2014-09-19 DIAGNOSIS — I5040 Unspecified combined systolic (congestive) and diastolic (congestive) heart failure: Secondary | ICD-10-CM | POA: Diagnosis not present

## 2014-09-24 DIAGNOSIS — I5040 Unspecified combined systolic (congestive) and diastolic (congestive) heart failure: Secondary | ICD-10-CM | POA: Diagnosis not present

## 2014-09-28 DIAGNOSIS — I5040 Unspecified combined systolic (congestive) and diastolic (congestive) heart failure: Secondary | ICD-10-CM | POA: Diagnosis not present

## 2014-10-01 DIAGNOSIS — I5040 Unspecified combined systolic (congestive) and diastolic (congestive) heart failure: Secondary | ICD-10-CM | POA: Diagnosis not present

## 2014-10-03 DIAGNOSIS — I5040 Unspecified combined systolic (congestive) and diastolic (congestive) heart failure: Secondary | ICD-10-CM | POA: Diagnosis not present

## 2014-10-06 DIAGNOSIS — I5040 Unspecified combined systolic (congestive) and diastolic (congestive) heart failure: Secondary | ICD-10-CM | POA: Diagnosis not present

## 2014-10-07 DIAGNOSIS — I5040 Unspecified combined systolic (congestive) and diastolic (congestive) heart failure: Secondary | ICD-10-CM | POA: Diagnosis not present

## 2014-10-08 DIAGNOSIS — I5040 Unspecified combined systolic (congestive) and diastolic (congestive) heart failure: Secondary | ICD-10-CM | POA: Diagnosis not present

## 2014-10-09 ENCOUNTER — Ambulatory Visit (HOSPITAL_COMMUNITY)
Admission: RE | Admit: 2014-10-09 | Discharge: 2014-10-09 | Disposition: A | Discharge status: Home / Self Care | Payer: Medicare Other | Source: Ambulatory Visit | Attending: Urology | Admitting: Urology

## 2014-10-09 ENCOUNTER — Other Ambulatory Visit (HOSPITAL_COMMUNITY): Payer: Self-pay | Admitting: Urology

## 2014-10-09 DIAGNOSIS — M5134 Other intervertebral disc degeneration, thoracic region: Secondary | ICD-10-CM | POA: Diagnosis not present

## 2014-10-09 DIAGNOSIS — C61 Malignant neoplasm of prostate: Secondary | ICD-10-CM | POA: Diagnosis not present

## 2014-10-10 DIAGNOSIS — I5040 Unspecified combined systolic (congestive) and diastolic (congestive) heart failure: Secondary | ICD-10-CM | POA: Diagnosis not present

## 2014-10-15 DIAGNOSIS — I5040 Unspecified combined systolic (congestive) and diastolic (congestive) heart failure: Secondary | ICD-10-CM | POA: Diagnosis not present

## 2014-10-16 ENCOUNTER — Telehealth: Payer: Self-pay | Admitting: Internal Medicine

## 2014-10-16 NOTE — Telephone Encounter (Signed)
New Msg       Is it ok for pt to have cataract surgery?   Please return call to [name].

## 2014-10-17 DIAGNOSIS — I5040 Unspecified combined systolic (congestive) and diastolic (congestive) heart failure: Secondary | ICD-10-CM | POA: Diagnosis not present

## 2014-10-17 NOTE — Telephone Encounter (Signed)
Discussed with Dr [name].  May proceed with cataract surgery.  Low risk from a cardiac standpoint

## 2014-10-22 DIAGNOSIS — I5040 Unspecified combined systolic (congestive) and diastolic (congestive) heart failure: Secondary | ICD-10-CM | POA: Diagnosis not present

## 2014-10-25 DIAGNOSIS — I5040 Unspecified combined systolic (congestive) and diastolic (congestive) heart failure: Secondary | ICD-10-CM | POA: Diagnosis not present

## 2014-10-26 DIAGNOSIS — I5040 Unspecified combined systolic (congestive) and diastolic (congestive) heart failure: Secondary | ICD-10-CM | POA: Diagnosis not present

## 2014-10-30 DIAGNOSIS — I5040 Unspecified combined systolic (congestive) and diastolic (congestive) heart failure: Secondary | ICD-10-CM | POA: Diagnosis not present

## 2014-10-31 ENCOUNTER — Emergency Department (HOSPITAL_COMMUNITY): Payer: Medicare Other

## 2014-10-31 ENCOUNTER — Encounter (HOSPITAL_COMMUNITY): Payer: Self-pay | Admitting: *Deleted

## 2014-10-31 ENCOUNTER — Inpatient Hospital Stay (HOSPITAL_COMMUNITY)
Admission: EM | Admit: 2014-10-31 | Discharge: 2014-11-04 | DRG: 388 | Disposition: A | Discharge status: Hospice | Payer: Medicare Other | Attending: Internal Medicine | Admitting: Internal Medicine

## 2014-10-31 ENCOUNTER — Inpatient Hospital Stay (HOSPITAL_COMMUNITY): Payer: Medicare Other

## 2014-10-31 ENCOUNTER — Emergency Department (HOSPITAL_COMMUNITY): Admission: EM | Admit: 2014-10-31 | Discharge: 2014-10-31 | Discharge status: Absent without leave | Payer: Self-pay

## 2014-10-31 DIAGNOSIS — R0989 Other specified symptoms and signs involving the circulatory and respiratory systems: Secondary | ICD-10-CM | POA: Diagnosis not present

## 2014-10-31 DIAGNOSIS — Z79899 Other long term (current) drug therapy: Secondary | ICD-10-CM

## 2014-10-31 DIAGNOSIS — R9431 Abnormal electrocardiogram [ECG] [EKG]: Secondary | ICD-10-CM | POA: Diagnosis not present

## 2014-10-31 DIAGNOSIS — K913 Postprocedural intestinal obstruction, unspecified as to partial versus complete: Secondary | ICD-10-CM | POA: Diagnosis present

## 2014-10-31 DIAGNOSIS — Z833 Family history of diabetes mellitus: Secondary | ICD-10-CM | POA: Diagnosis not present

## 2014-10-31 DIAGNOSIS — E785 Hyperlipidemia, unspecified: Secondary | ICD-10-CM | POA: Diagnosis present

## 2014-10-31 DIAGNOSIS — I472 Ventricular tachycardia: Secondary | ICD-10-CM | POA: Diagnosis present

## 2014-10-31 DIAGNOSIS — M109 Gout, unspecified: Secondary | ICD-10-CM | POA: Diagnosis present

## 2014-10-31 DIAGNOSIS — I4892 Unspecified atrial flutter: Secondary | ICD-10-CM | POA: Diagnosis present

## 2014-10-31 DIAGNOSIS — Z9049 Acquired absence of other specified parts of digestive tract: Secondary | ICD-10-CM | POA: Diagnosis present

## 2014-10-31 DIAGNOSIS — N179 Acute kidney failure, unspecified: Secondary | ICD-10-CM

## 2014-10-31 DIAGNOSIS — Z8249 Family history of ischemic heart disease and other diseases of the circulatory system: Secondary | ICD-10-CM

## 2014-10-31 DIAGNOSIS — R1030 Lower abdominal pain, unspecified: Secondary | ICD-10-CM | POA: Diagnosis not present

## 2014-10-31 DIAGNOSIS — E1159 Type 2 diabetes mellitus with other circulatory complications: Secondary | ICD-10-CM

## 2014-10-31 DIAGNOSIS — Z6824 Body mass index (BMI) 24.0-24.9, adult: Secondary | ICD-10-CM | POA: Diagnosis not present

## 2014-10-31 DIAGNOSIS — Z66 Do not resuscitate: Secondary | ICD-10-CM | POA: Diagnosis present

## 2014-10-31 DIAGNOSIS — J439 Emphysema, unspecified: Secondary | ICD-10-CM | POA: Diagnosis not present

## 2014-10-31 DIAGNOSIS — K56609 Unspecified intestinal obstruction, unspecified as to partial versus complete obstruction: Secondary | ICD-10-CM

## 2014-10-31 DIAGNOSIS — Z95810 Presence of automatic (implantable) cardiac defibrillator: Secondary | ICD-10-CM | POA: Diagnosis present

## 2014-10-31 DIAGNOSIS — I4891 Unspecified atrial fibrillation: Secondary | ICD-10-CM | POA: Diagnosis present

## 2014-10-31 DIAGNOSIS — K5730 Diverticulosis of large intestine without perforation or abscess without bleeding: Secondary | ICD-10-CM | POA: Diagnosis present

## 2014-10-31 DIAGNOSIS — Z7901 Long term (current) use of anticoagulants: Secondary | ICD-10-CM | POA: Diagnosis not present

## 2014-10-31 DIAGNOSIS — I255 Ischemic cardiomyopathy: Secondary | ICD-10-CM | POA: Diagnosis not present

## 2014-10-31 DIAGNOSIS — I10 Essential (primary) hypertension: Secondary | ICD-10-CM | POA: Diagnosis present

## 2014-10-31 DIAGNOSIS — Z8546 Personal history of malignant neoplasm of prostate: Secondary | ICD-10-CM | POA: Diagnosis not present

## 2014-10-31 DIAGNOSIS — I484 Atypical atrial flutter: Secondary | ICD-10-CM | POA: Diagnosis not present

## 2014-10-31 DIAGNOSIS — I442 Atrioventricular block, complete: Secondary | ICD-10-CM | POA: Diagnosis present

## 2014-10-31 DIAGNOSIS — Z7982 Long term (current) use of aspirin: Secondary | ICD-10-CM | POA: Diagnosis not present

## 2014-10-31 DIAGNOSIS — K8050 Calculus of bile duct without cholangitis or cholecystitis without obstruction: Secondary | ICD-10-CM | POA: Diagnosis present

## 2014-10-31 DIAGNOSIS — Z955 Presence of coronary angioplasty implant and graft: Secondary | ICD-10-CM | POA: Diagnosis not present

## 2014-10-31 DIAGNOSIS — I5022 Chronic systolic (congestive) heart failure: Secondary | ICD-10-CM | POA: Diagnosis not present

## 2014-10-31 DIAGNOSIS — K5669 Other intestinal obstruction: Secondary | ICD-10-CM | POA: Diagnosis not present

## 2014-10-31 DIAGNOSIS — F17210 Nicotine dependence, cigarettes, uncomplicated: Secondary | ICD-10-CM | POA: Diagnosis present

## 2014-10-31 DIAGNOSIS — K565 Intestinal adhesions [bands] with obstruction (postprocedural) (postinfection): Principal | ICD-10-CM | POA: Diagnosis present

## 2014-10-31 DIAGNOSIS — E43 Unspecified severe protein-calorie malnutrition: Secondary | ICD-10-CM | POA: Insufficient documentation

## 2014-10-31 DIAGNOSIS — E119 Type 2 diabetes mellitus without complications: Secondary | ICD-10-CM | POA: Diagnosis present

## 2014-10-31 DIAGNOSIS — I2510 Atherosclerotic heart disease of native coronary artery without angina pectoris: Secondary | ICD-10-CM | POA: Diagnosis present

## 2014-10-31 DIAGNOSIS — I5040 Unspecified combined systolic (congestive) and diastolic (congestive) heart failure: Secondary | ICD-10-CM | POA: Diagnosis not present

## 2014-10-31 DIAGNOSIS — K56600 Partial intestinal obstruction, unspecified as to cause: Secondary | ICD-10-CM | POA: Diagnosis present

## 2014-10-31 DIAGNOSIS — I509 Heart failure, unspecified: Secondary | ICD-10-CM

## 2014-10-31 DIAGNOSIS — Z01810 Encounter for preprocedural cardiovascular examination: Secondary | ICD-10-CM | POA: Insufficient documentation

## 2014-10-31 DIAGNOSIS — Z951 Presence of aortocoronary bypass graft: Secondary | ICD-10-CM

## 2014-10-31 HISTORY — DX: Unspecified intestinal obstruction, unspecified as to partial versus complete obstruction: K56.609

## 2014-10-31 HISTORY — DX: Presence of automatic (implantable) cardiac defibrillator: Z95.810

## 2014-10-31 HISTORY — DX: Unspecified osteoarthritis, unspecified site: M19.90

## 2014-10-31 HISTORY — DX: Diverticulosis of large intestine without perforation or abscess without bleeding: K57.30

## 2014-10-31 HISTORY — DX: Dependence on supplemental oxygen: Z99.81

## 2014-10-31 LAB — GLUCOSE, CAPILLARY
GLUCOSE-CAPILLARY: 135 mg/dL — AB (ref 70–99)
Glucose-Capillary: 122 mg/dL — ABNORMAL HIGH (ref 70–99)
Glucose-Capillary: 126 mg/dL — ABNORMAL HIGH (ref 70–99)

## 2014-10-31 LAB — CBC WITH DIFFERENTIAL/PLATELET
BASOS ABS: 0 10*3/uL (ref 0.0–0.1)
BASOS PCT: 0 % (ref 0–1)
Eosinophils Absolute: 0.1 10*3/uL (ref 0.0–0.7)
Eosinophils Relative: 1 % (ref 0–5)
HCT: 38.8 % — ABNORMAL LOW (ref 39.0–52.0)
Hemoglobin: 14 g/dL (ref 13.0–17.0)
LYMPHS PCT: 16 % (ref 12–46)
Lymphs Abs: 1.1 10*3/uL (ref 0.7–4.0)
MCH: 32.1 pg (ref 26.0–34.0)
MCHC: 36.1 g/dL — ABNORMAL HIGH (ref 30.0–36.0)
MCV: 89 fL (ref 78.0–100.0)
MONOS PCT: 7 % (ref 3–12)
Monocytes Absolute: 0.4 10*3/uL (ref 0.1–1.0)
NEUTROS PCT: 76 % (ref 43–77)
Neutro Abs: 5 10*3/uL (ref 1.7–7.7)
PLATELETS: 131 10*3/uL — AB (ref 150–400)
RBC: 4.36 MIL/uL (ref 4.22–5.81)
RDW: 13.6 % (ref 11.5–15.5)
WBC: 6.6 10*3/uL (ref 4.0–10.5)

## 2014-10-31 LAB — TROPONIN I: Troponin I: 0.03 ng/mL (ref <0.031)

## 2014-10-31 LAB — HEPATIC FUNCTION PANEL
ALBUMIN: 4.4 g/dL (ref 3.5–5.2)
ALT: 24 U/L (ref 0–53)
AST: 32 U/L (ref 0–37)
Alkaline Phosphatase: 108 U/L (ref 39–117)
BILIRUBIN TOTAL: 1 mg/dL (ref 0.3–1.2)
Bilirubin, Direct: 0.4 mg/dL (ref 0.0–0.5)
Indirect Bilirubin: 0.6 mg/dL (ref 0.3–0.9)
Total Protein: 7.7 g/dL (ref 6.0–8.3)

## 2014-10-31 LAB — BASIC METABOLIC PANEL
Anion gap: 11 (ref 5–15)
BUN: 33 mg/dL — ABNORMAL HIGH (ref 6–23)
CHLORIDE: 98 mmol/L (ref 96–112)
CO2: 23 mmol/L (ref 19–32)
Calcium: 9.7 mg/dL (ref 8.4–10.5)
Creatinine, Ser: 1.63 mg/dL — ABNORMAL HIGH (ref 0.50–1.35)
GFR calc Af Amer: 43 mL/min — ABNORMAL LOW (ref >90)
GFR calc non Af Amer: 37 mL/min — ABNORMAL LOW (ref >90)
Glucose, Bld: 173 mg/dL — ABNORMAL HIGH (ref 70–99)
Potassium: 4.4 mmol/L (ref 3.5–5.1)
Sodium: 132 mmol/L — ABNORMAL LOW (ref 135–145)

## 2014-10-31 LAB — HEPARIN LEVEL (UNFRACTIONATED): HEPARIN UNFRACTIONATED: 0.78 [IU]/mL — AB (ref 0.30–0.70)

## 2014-10-31 LAB — LIPASE, BLOOD: Lipase: 31 U/L (ref 11–59)

## 2014-10-31 LAB — APTT: APTT: 54 s — AB (ref 24–37)

## 2014-10-31 MED ORDER — ONDANSETRON HCL 4 MG/2ML IJ SOLN
4.0000 mg | Freq: Four times a day (QID) | INTRAMUSCULAR | Status: DC | PRN
  Administered 2014-11-01: 4 mg via INTRAVENOUS
  Filled 2014-10-31: qty 2

## 2014-10-31 MED ORDER — ACETAMINOPHEN 325 MG PO TABS
650.0000 mg | ORAL_TABLET | Freq: Four times a day (QID) | ORAL | Status: DC | PRN
  Administered 2014-11-01 – 2014-11-03 (×2): 650 mg via ORAL
  Filled 2014-10-31 (×2): qty 2

## 2014-10-31 MED ORDER — HEPARIN (PORCINE) IN NACL 100-0.45 UNIT/ML-% IJ SOLN
750.0000 [IU]/h | INTRAMUSCULAR | Status: DC
  Administered 2014-10-31: 600 [IU]/h via INTRAVENOUS
  Filled 2014-10-31: qty 250

## 2014-10-31 MED ORDER — ONDANSETRON HCL 4 MG/2ML IJ SOLN
4.0000 mg | Freq: Once | INTRAMUSCULAR | Status: AC
  Administered 2014-10-31: 4 mg via INTRAVENOUS
  Filled 2014-10-31: qty 2

## 2014-10-31 MED ORDER — CARVEDILOL 3.125 MG PO TABS
3.1250 mg | ORAL_TABLET | Freq: Two times a day (BID) | ORAL | Status: DC
  Administered 2014-10-31 – 2014-11-04 (×9): 3.125 mg via ORAL
  Filled 2014-10-31 (×9): qty 1

## 2014-10-31 MED ORDER — IOHEXOL 300 MG/ML  SOLN
80.0000 mL | Freq: Once | INTRAMUSCULAR | Status: AC | PRN
  Administered 2014-10-31: 80 mL via INTRAVENOUS

## 2014-10-31 MED ORDER — NITROGLYCERIN 0.4 MG SL SUBL: 0.4000 mg | SUBLINGUAL_TABLET | SUBLINGUAL | Status: DC | PRN

## 2014-10-31 MED ORDER — FAMOTIDINE 20 MG PO TABS
20.0000 mg | ORAL_TABLET | Freq: Two times a day (BID) | ORAL | Status: DC
  Administered 2014-10-31 – 2014-11-04 (×9): 20 mg via ORAL
  Filled 2014-10-31 (×9): qty 1

## 2014-10-31 MED ORDER — IOHEXOL 300 MG/ML  SOLN
25.0000 mL | Freq: Once | INTRAMUSCULAR | Status: AC | PRN
  Administered 2014-10-31: 25 mL via ORAL

## 2014-10-31 MED ORDER — ONDANSETRON HCL 4 MG/2ML IJ SOLN: 4.0000 mg | Freq: Three times a day (TID) | INTRAMUSCULAR | Status: DC | PRN

## 2014-10-31 MED ORDER — ASPIRIN EC 81 MG PO TBEC
81.0000 mg | DELAYED_RELEASE_TABLET | Freq: Every day | ORAL | Status: DC
  Administered 2014-11-01 – 2014-11-04 (×4): 81 mg via ORAL
  Filled 2014-10-31 (×7): qty 1

## 2014-10-31 MED ORDER — ONDANSETRON HCL 4 MG PO TABS: 4.0000 mg | ORAL_TABLET | Freq: Four times a day (QID) | ORAL | Status: DC | PRN

## 2014-10-31 MED ORDER — HYDROMORPHONE HCL 1 MG/ML IJ SOLN: 1.0000 mg | INTRAMUSCULAR | Status: DC | PRN

## 2014-10-31 MED ORDER — INSULIN GLARGINE 100 UNIT/ML ~~LOC~~ SOLN
5.0000 [IU] | Freq: Every day | SUBCUTANEOUS | Status: DC
  Administered 2014-10-31: 5 [IU] via SUBCUTANEOUS
  Filled 2014-10-31 (×3): qty 0.05

## 2014-10-31 MED ORDER — MORPHINE SULFATE 4 MG/ML IJ SOLN
4.0000 mg | Freq: Once | INTRAMUSCULAR | Status: AC
  Administered 2014-10-31: 4 mg via INTRAVENOUS
  Filled 2014-10-31: qty 1

## 2014-10-31 MED ORDER — SODIUM CHLORIDE 0.9 % IV SOLN: INTRAVENOUS | Status: DC

## 2014-10-31 MED ORDER — ESCITALOPRAM OXALATE 10 MG PO TABS
10.0000 mg | ORAL_TABLET | Freq: Every day | ORAL | Status: DC
  Administered 2014-10-31 – 2014-11-04 (×5): 10 mg via ORAL
  Filled 2014-10-31 (×5): qty 1

## 2014-10-31 MED ORDER — OLOPATADINE HCL 0.1 % OP SOLN
1.0000 [drp] | Freq: Two times a day (BID) | OPHTHALMIC | Status: DC
  Administered 2014-10-31 – 2014-11-04 (×9): 1 [drp] via OPHTHALMIC
  Filled 2014-10-31: qty 5

## 2014-10-31 MED ORDER — ACETAMINOPHEN 650 MG RE SUPP: 650.0000 mg | Freq: Four times a day (QID) | RECTAL | Status: DC | PRN

## 2014-10-31 MED ORDER — RAMIPRIL 2.5 MG PO CAPS
2.5000 mg | ORAL_CAPSULE | Freq: Every day | ORAL | Status: DC
  Administered 2014-10-31 – 2014-11-01 (×2): 2.5 mg via ORAL
  Filled 2014-10-31 (×2): qty 1

## 2014-10-31 MED ORDER — INSULIN ASPART 100 UNIT/ML ~~LOC~~ SOLN
0.0000 [IU] | SUBCUTANEOUS | Status: DC
  Administered 2014-10-31 (×3): 1 [IU] via SUBCUTANEOUS
  Administered 2014-11-01: 3 [IU] via SUBCUTANEOUS

## 2014-10-31 MED ORDER — SODIUM CHLORIDE 0.9 % IV SOLN
INTRAVENOUS | Status: DC
  Administered 2014-10-31 – 2014-11-02 (×4): via INTRAVENOUS

## 2014-10-31 MED ORDER — MORPHINE SULFATE 2 MG/ML IJ SOLN
1.0000 mg | INTRAMUSCULAR | Status: DC | PRN
  Administered 2014-10-31 – 2014-11-01 (×2): 1 mg via INTRAVENOUS
  Filled 2014-10-31 (×2): qty 1

## 2014-10-31 NOTE — Progress Notes (Signed)
PT Cancellation Note  Patient Details Name: [name] MRN: [id number] DOB: May 06, 1927   Cancelled Treatment:    Reason Eval/Treat Not Completed: Other (comment) (pt just admitted and denies desire to mobilize today but requests return tomorrow)   Lanetta Inch [facility] 10/31/2014, 11:57 AM Elwyn Reach, [place]

## 2014-10-31 NOTE — ED Notes (Signed)
Attempted report 

## 2014-10-31 NOTE — H&P (Signed)
[facility] History and Physical                                                                                    [name], is a 81 y.o. male  MRN: [id number]   DOB - Jan 21, 1927  Admit Date - 10/31/2014  Outpatient Primary MD for the patient is [name], MD  Cardiologist: Dr. Vilinda Boehringer and Dr. Crissie Sickles  With History of -  Past Medical History  Diagnosis Date  . CAD (coronary artery disease)     s/p CABG; s/p Pacemaker  . Systolic CHF with reduced left ventricular function, NYHA class 2   . Ischemic cardiomyopathy     s/p ICD  . Hypertension   . Hyperlipidemia   . Diabetes mellitus   . Gout   . Paroxysmal ventricular tachycardia       Past Surgical History  Procedure Laterality Date  . Coronary artery bypass graft    . Cardiac defibrillator placement      generator change 10/12/11  . Bowel resection    . Coronary angioplasty with stent placement    . Bi-ventricular implantable cardioverter defibrillator upgrade  01-22-2014    upgrade of previously implanted single chamber ICD to MDT CRTD by Dr [name] with new atrial lead placement - epicardial LV leads used  . Implantable cardioverter defibrillator (icd) generator change Left 10/12/2011    Procedure: ICD GENERATOR CHANGE;  Surgeon: Evans Lance, MD;  Location: [facility] CATH LAB;  Service: Cardiovascular;  Laterality: Left;  . Pacemaker revision N/A 10/12/2011    Procedure: PACEMAKER REVISION;  Surgeon: Evans Lance, MD;  Location: [facility] CATH LAB;  Service: Cardiovascular;  Laterality: N/A;  . Bi-ventricular implantable cardioverter defibrillator upgrade N/A 01/22/2014    Procedure: BI-VENTRICULAR IMPLANTABLE CARDIOVERTER DEFIBRILLATOR UPGRADE;  Surgeon: Evans Lance, MD;  Location: [facility] CATH LAB;  Service: Cardiovascular;  Laterality: N/A;    in for   Chief Complaint  Patient presents with  . Shortness of Breath  . Abdominal Pain     HPI  [name]  is a 81 y.o. male, with a past medical history of  systolic heart failure (EF 20-25% in November/2015), diet controlled diabetes mellitus, a flutter (on Xarelto), and history of perforated bowel obstruction approximately 20 years ago. Hospice visits him at home once a week. Otherwise he is at home alone and checked on by friends.  He presents to the ER with severe abdominal pain that started 2/25. He had one episode of vomiting yesterday after eating fried chicken, peanuts, and apples. He has had regular loose bowel movements.  In the ER, CT scan reveals partial bowel obstruction with a transition point in the right upper quadrant. Also noted is a CBD stone with 7 mm of extrahepatic ductal dilation. His creatinine is elevated at 1.63 (baseline is approximately 1.3) platelets are 131, sodium is 132. The patient appears stable.   Review of Systems   In addition to the HPI above,  No Fever-chills, No Headache, No changes with Vision or hearing, No problems swallowing food or Liquids, No Chest pain, Cough or Shortness of Breath, No Blood in stool or Urine, No dysuria,  No new skin rashes or bruises, No new joints pains-aches,  No new weakness, tingling, numbness in any extremity, A full 10 point Review of Systems was done, except as stated above, all other Review of Systems were negative.  Social History History  Substance Use Topics  . Smoking status: Current Some Day Smoker    Types: Pipe, Cigars  . Smokeless tobacco: Not on file     Comment: every "now and then"  . Alcohol Use: No     Comment: occasional    Family History Father died in an accident when he was 82 years old.  Mother had diabetes.  Multiple family members were alcoholics and died with heart disease.  Prior to Admission medications   Medication Sig Start Date End Date Taking? Authorizing Provider  allopurinol (ZYLOPRIM) 300 MG tablet Take 300 mg by mouth daily.      Historical Provider, MD  atorvastatin (LIPITOR) 20 MG tablet Take 20 mg by mouth daily.      Historical  Provider, MD  carvedilol (COREG) 6.25 MG tablet Take 3.125 mg by mouth 2 (two) times daily.     Historical Provider, MD  docusate sodium (COLACE) 100 MG capsule Take 1 capsule (100 mg total) by mouth every 12 (twelve) hours. 01/07/14   Ruthell Rummage Dammen, PA-C  escitalopram (LEXAPRO) 10 MG tablet Take 10 mg by mouth daily.  07/11/14   Historical Provider, MD  famotidine (PEPCID) 20 MG tablet Take 20 mg by mouth 2 (two) times daily.      Historical Provider, MD  NITROSTAT 0.4 MG SL tablet Place 0.4 mg under the tongue every 5 (five) minutes as needed for chest pain (MAX 3 TABLETS).  12/05/12   Historical Provider, MD  PATADAY 0.2 % SOLN Place 1 drop into both eyes daily. 01/14/14   Historical Provider, MD  RA ASPIRIN ADULT LOW STRENGTH 81 MG EC tablet Take 1 tablet by mouth daily. 09/01/14   Historical Provider, MD  ramipril (ALTACE) 2.5 MG capsule Take 2.5 mg by mouth daily.     Historical Provider, MD  Rivaroxaban (XARELTO) 15 MG TABS tablet Take 1 tablet (15 mg total) by mouth daily. 07/30/14   Clent Demark, MD  spironolactone (ALDACTONE) 12.5 mg TABS tablet Take 0.5 tablets (12.5 mg total) by mouth daily. 07/30/14   Clent Demark, MD  sucralfate (CARAFATE) 1 G tablet Take 1 g by mouth 3 (three) times daily.     Historical Provider, MD    No Known Allergies  Physical Exam  Vitals  Blood pressure 112/82, pulse 79, temperature 98.1 F (36.7 C), temperature source Oral, resp. rate 17, weight 56.972 kg (125 lb 9.6 oz), SpO2 92 %.   General:  Thin, well groomed, pleasant, elderly male, lying in bed in NAD, dtr at bedside.  Psych:  Normal affect and insight, Not Suicidal or Homicidal, Awake Alert, Oriented X 3.  Neuro:   No F.N deficits, ALL C.Nerves Intact, Strength 5/5 all 4 extremities, Sensation intact all 4 extremities.  ENT:  Ears and Eyes appear Normal, Conjunctivae clear, PER. Moist oral mucosa without erythema or exudates.  Neck:  Supple, No lymphadenopathy appreciated  Respiratory:   Symmetrical chest wall movement, decreased breath sounds on the right.  Cardiac:  RRR, No Murmurs, no LE edema noted, no JVD.    Abdomen:  Positive bowel sounds, Soft, Non tender, Non distended,  Multiple scars and hernias from previous bowel surgery.  Skin:  No Cyanosis, Normal Skin Turgor, No Skin Rash  or Bruise.  Extremities:  Able to move all 4. 5/5 strength in each,  no effusions.  Data Review  CBC  Recent Labs Lab 10/31/14 0230  WBC 6.6  HGB 14.0  HCT 38.8*  PLT 131*  MCV 89.0  MCH 32.1  MCHC 36.1*  RDW 13.6  LYMPHSABS 1.1  MONOABS 0.4  EOSABS 0.1  BASOSABS 0.0    Chemistries   Recent Labs Lab 10/31/14 0230  NA 132*  K 4.4  CL 98  CO2 23  GLUCOSE 173*  BUN 33*  CREATININE 1.63*  CALCIUM 9.7  AST 32  ALT 24  ALKPHOS 108  BILITOT 1.0     Cardiac Enzymes  Recent Labs Lab 10/31/14 0230  TROPONINI 0.03     Imaging results:   Ct Abdomen Pelvis W Contrast  10/31/2014   CLINICAL DATA:  Lower abdominal pain, nausea beginning at 5 p.m. yesterday. Shortness of breath. Recent weight loss. History of prostate cancer.  EXAM: CT ABDOMEN AND PELVIS WITH CONTRAST  TECHNIQUE: Multidetector CT imaging of the abdomen and pelvis was performed using the standard protocol following bolus administration of intravenous contrast.  CONTRAST:  5mL OMNIPAQUE IOHEXOL 300 MG/ML  SOLN  COMPARISON:  Bone scan September 16, 2014  FINDINGS: LUNG BASES: At least moderate cardiomegaly, with cardiac pacer wires in place, no pericardial lesions. The lungs are clear. Status post median sternotomy.  SOLID ORGANS: The liver, spleen, pancreas and adrenal glands are unremarkable. Common bile duct is enlarged, 12 mm with 7 mm distal Common bile duct stone, coronal 54/112.  GASTROINTESTINAL TRACT: Mildly dilated proximal small bowel with small bowel feces measuring up to 2.8 cm, transition point RIGHT lower quadrant associated with circumferential wall thickening. The stomach, and large bowel  are normal in course and caliber without inflammatory changes. Extensive colonic diverticulosis. Enteric contrast has not yet reached the distal small bowel.  KIDNEYS/ URINARY TRACT: Kidneys are orthotopic, demonstrating symmetric enhancement on early phase, slightly delayed nephrogram on the LEFT. No nephrolithiasis, hydronephrosis or solid renal masses. Bilateral low-density renal cysts measure up to 3.2 cm in upper pole RIGHT kidney. The unopacified ureters are normal in course and caliber. Delayed imaging through the kidneys demonstrates symmetric prompt contrast excretion within the proximal urinary collecting system. Urinary bladder is well distended, with mild wall thickening.  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 No lymphadenopathy by CT size criteria. Prostate is enlarged, 5.7 x 5.4 cm, invading the base of the urinary bladder. Small to moderate free fluid in the RIGHT upper quadrant. No intraperitoneal free air.  SOFT TISSUE/OSSEOUS STRUCTURES: Rectus abdominus diastases, fatty atrophy with apposition of the small bowel to the of anterior abdominal wall associated with scarring. Grade 1 L4-5 anterolisthesis on degenerative basis.  IMPRESSION: Partial small bowel obstruction, transition point in RIGHT upper quadrant associated with small bowel wall thickening, possible adhesions.  7 mm distal Common bile duct stone with mild extra hepatic biliary dilatation.  Slightly delayed nephrogram on the LEFT concerning for Mild dysfunction, if clinically indicated, renal scintigraphy could be performed. No obstructive uropathy.  Prostatomegaly.   Electronically Signed   By: Elon Alas   On: 10/31/2014 05:55    My personal review of EKG: paced.   Assessment & Plan  Principal Problem:   Partial small bowel obstruction Active Problems:   Chronic systolic heart failure    Automatic implantable cardioverter-defibrillator in situ   Atrial flutter   Diabetes  mellitus   Acute renal failure   Do not resuscitate  Abdominal pain and vomiting secondary to Partial small bowel obstruction Likely secondary to surgical adhesions. Surgery consulted. Patient does not want NG tube if this can be avoided. Nothing by mouth except for a few medications.  IVF at 50 ml/hour Am abdominal xray ordered.   Ambulate in hallways.  Monitor potassium.  CBD stone CT scan shows 7 mm CBD stone with mild extra hepatic biliary dilation.  LFTs normal. Discussed with Dr. Paulita Fujita (on call unassigned) who states he would just monitor for now. If patient's abdominal pain does not improve with resolution of PSBO.  Please call GI back.  CHF with EF of 20 - 25%. Currently Euvolemic, but will check chest xray due to decreased breath sounds. Holding diuretics due to ARF.  Monitor volume status closely.  Patient has had recent hospitalizations for CHF.  Acute renal failure Creatine up 0.3.  Hold diuretics.  Continue low dose Ace-I. Gentle IVF at 50 ml/hour.  AM bmet  Afib / Aflutter.  History of Complete Heart Block, ICD in place. Patient normally on Xeralto.  Will place on heparin per pharmacy in case Surgery is needed. Currently quiet & stable.  Continue BB and CCB.  DM Check hgb A1C.  Patient has lost a great deal of weight in the past 2 years. Does not appear to be on medications at home for DM. Will place on SSI q 4 hours with lantus 5 units QHS as his cbg is 173.  Disposition:   Patient is on hospice for weight loss and heart failure.  Will likely return home with Hospice care. [facility].   DVT Prophylaxis: Heparin per pharmacy. Patient is normally on Xarelto.  AM Labs Ordered, also please review Full Orders  Family Communication:   [name] at bedside, friend and power of attorney.  Code Status:  DO NOT RESUSCITATE   Condition:  Guarded but stable  Time spent in  minutes : [place],  PA-C on 10/31/2014 at 8:01 AM  Between 7am to 7pm - Pager - [phone]  After 7pm go to [email or web address] - password [email or web address]  And look for the night coverage person covering me after hours  [facility]

## 2014-10-31 NOTE — ED Notes (Signed)
Attempted to insert #16 ng tube right nare x 2 unsuccessful, patient states  He will need to wait.

## 2014-10-31 NOTE — ED Notes (Signed)
hospitalist at the bedside 

## 2014-10-31 NOTE — Consult Note (Signed)
Reason for Consult: SBO Referring Physician: Dr. Veryl Speak   HPI: [name] is a 81 year old male with a history of sCHF EF 20-25%, AF on Xarelto, CAD s/p CABG, PCI, ICM s/p ICD, HTN, DM, diverticulosis, bowel resection about 30 years ago secondary to "bowel perforation presenting with abdominal pain and vomiting.  This began suddenly yesterday evening after he ate peanuts.  The patient reports "feeling funny" he then went to [employer] and had chicken, but symptoms persisted.  He vomited the peanuts reportedly about 1AM.  He then called his hospice nurse who directed him to the ED.  The patient has been on hospice for about a year now.  His friend and POA, [name] is at bedside and provided some of the history.  The patient reports his last bowel movement yesterday about 2PM.  He has not passed flatus since.  He has not vomited since the initial episode.  He reports having hernias for many years and was evaluated by [facility] several years.  He denies previous bowel obstructions.  He reports significant weight loss and apparently this is why he is on hospice.  His work up shows a normal white count. A sCr 1.63 which was previously normal, normal electrolytes.  A Ct of abdomen and pelvis demonstrating a PSBO with a transition point RUQ.  We have therefore been asked to evaluate the patient.      Past Medical History  Diagnosis Date  . CAD (coronary artery disease)     s/p CABG; s/p Pacemaker  . Systolic CHF with reduced left ventricular function, NYHA class 2   . Ischemic cardiomyopathy     s/p ICD  . Hypertension   . Hyperlipidemia   . Diabetes mellitus   . Gout   . Paroxysmal ventricular tachycardia     Past Surgical History  Procedure Laterality Date  . Coronary artery bypass graft    . Cardiac defibrillator placement      generator change 10/12/11  . Bowel resection    . Coronary angioplasty with stent placement    . Bi-ventricular implantable cardioverter defibrillator upgrade  01-22-2014     upgrade of previously implanted single chamber ICD to MDT CRTD by Dr [name] with new atrial lead placement - epicardial LV leads used  . Implantable cardioverter defibrillator (icd) generator change Left 10/12/2011    Procedure: ICD GENERATOR CHANGE;  Surgeon: Evans Lance, MD;  Location: [facility] CATH LAB;  Service: Cardiovascular;  Laterality: Left;  . Pacemaker revision N/A 10/12/2011    Procedure: PACEMAKER REVISION;  Surgeon: Evans Lance, MD;  Location: [facility] CATH LAB;  Service: Cardiovascular;  Laterality: N/A;  . Bi-ventricular implantable cardioverter defibrillator upgrade N/A 01/22/2014    Procedure: BI-VENTRICULAR IMPLANTABLE CARDIOVERTER DEFIBRILLATOR UPGRADE;  Surgeon: Evans Lance, MD;  Location: [facility] CATH LAB;  Service: Cardiovascular;  Laterality: N/A;    No family history on file.  Social History:  reports that he has been smoking Pipe and Cigars.  He does not have any smokeless tobacco history on file. He reports that he does not drink alcohol or use illicit drugs. Lives alone.   Allergies: No Known Allergies  Medications:  Prior to Admission medications   Medication Sig Start Date End Date Taking? Authorizing Provider  allopurinol (ZYLOPRIM) 300 MG tablet Take 300 mg by mouth daily.      Historical Provider, MD  atorvastatin (LIPITOR) 20 MG tablet Take 20 mg by mouth daily.      Historical Provider, MD  carvedilol (  COREG) 6.25 MG tablet Take 3.125 mg by mouth 2 (two) times daily.     Historical Provider, MD  docusate sodium (COLACE) 100 MG capsule Take 1 capsule (100 mg total) by mouth every 12 (twelve) hours. 01/07/14   Ruthell Rummage Dammen, PA-C  escitalopram (LEXAPRO) 10 MG tablet Take 10 mg by mouth daily.  07/11/14   Historical Provider, MD  famotidine (PEPCID) 20 MG tablet Take 20 mg by mouth 2 (two) times daily.      Historical Provider, MD  NITROSTAT 0.4 MG SL tablet Place 0.4 mg under the tongue every 5 (five) minutes as needed for chest pain (MAX 3 TABLETS).  12/05/12    Historical Provider, MD  PATADAY 0.2 % SOLN Place 1 drop into both eyes daily. 01/14/14   Historical Provider, MD  RA ASPIRIN ADULT LOW STRENGTH 81 MG EC tablet Take 1 tablet by mouth daily. 09/01/14   Historical Provider, MD  ramipril (ALTACE) 2.5 MG capsule Take 2.5 mg by mouth daily.     Historical Provider, MD  Rivaroxaban (XARELTO) 15 MG TABS tablet Take 1 tablet (15 mg total) by mouth daily. 07/30/14   Clent Demark, MD  spironolactone (ALDACTONE) 12.5 mg TABS tablet Take 0.5 tablets (12.5 mg total) by mouth daily. 07/30/14   Clent Demark, MD  sucralfate (CARAFATE) 1 G tablet Take 1 g by mouth 3 (three) times daily.     Historical Provider, MD    Results for orders placed or performed during the hospital encounter of 10/31/14 (from the past 48 hour(s))  CBC with Differential     Status: Abnormal   Collection Time: 10/31/14  2:30 AM  Result Value Ref Range   WBC 6.6 4.0 - 10.5 K/uL   RBC 4.36 4.22 - 5.81 MIL/uL   Hemoglobin 14.0 13.0 - 17.0 g/dL   HCT 38.8 (L) 39.0 - 52.0 %   MCV 89.0 78.0 - 100.0 fL   MCH 32.1 26.0 - 34.0 pg   MCHC 36.1 (H) 30.0 - 36.0 g/dL   RDW 13.6 11.5 - 15.5 %   Platelets 131 (L) 150 - 400 K/uL   Neutrophils Relative % 76 43 - 77 %   Neutro Abs 5.0 1.7 - 7.7 K/uL   Lymphocytes Relative 16 12 - 46 %   Lymphs Abs 1.1 0.7 - 4.0 K/uL   Monocytes Relative 7 3 - 12 %   Monocytes Absolute 0.4 0.1 - 1.0 K/uL   Eosinophils Relative 1 0 - 5 %   Eosinophils Absolute 0.1 0.0 - 0.7 K/uL   Basophils Relative 0 0 - 1 %   Basophils Absolute 0.0 0.0 - 0.1 K/uL  Basic metabolic panel     Status: Abnormal   Collection Time: 10/31/14  2:30 AM  Result Value Ref Range   Sodium 132 (L) 135 - 145 mmol/L   Potassium 4.4 3.5 - 5.1 mmol/L   Chloride 98 96 - 112 mmol/L   CO2 23 19 - 32 mmol/L   Glucose, Bld 173 (H) 70 - 99 mg/dL   BUN 33 (H) 6 - 23 mg/dL   Creatinine, Ser 1.63 (H) 0.50 - 1.35 mg/dL   Calcium 9.7 8.4 - 10.5 mg/dL   GFR calc non Af Amer 37 (L) >90 mL/min    GFR calc Af Amer 43 (L) >90 mL/min    Comment: (NOTE) The eGFR has been calculated using the CKD EPI equation. This calculation has not been validated in all clinical situations. eGFR's persistently <90 mL/min  signify possible Chronic Kidney Disease.    Anion gap 11 5 - 15  Troponin I     Status: None   Collection Time: 10/31/14  2:30 AM  Result Value Ref Range   Troponin I 0.03 <0.031 ng/mL    Comment:        NO INDICATION OF MYOCARDIAL INJURY.   Hepatic function panel     Status: None   Collection Time: 10/31/14  2:30 AM  Result Value Ref Range   Total Protein 7.7 6.0 - 8.3 g/dL   Albumin 4.4 3.5 - 5.2 g/dL   AST 32 0 - 37 U/L   ALT 24 0 - 53 U/L   Alkaline Phosphatase 108 39 - 117 U/L   Total Bilirubin 1.0 0.3 - 1.2 mg/dL   Bilirubin, Direct 0.4 0.0 - 0.5 mg/dL   Indirect Bilirubin 0.6 0.3 - 0.9 mg/dL  Lipase, blood     Status: None   Collection Time: 10/31/14  2:30 AM  Result Value Ref Range   Lipase 31 11 - 59 U/L    Ct Abdomen Pelvis W Contrast  10/31/2014   CLINICAL DATA:  Lower abdominal pain, nausea beginning at 5 p.m. yesterday. Shortness of breath. Recent weight loss. History of prostate cancer.  EXAM: CT ABDOMEN AND PELVIS WITH CONTRAST  TECHNIQUE: Multidetector CT imaging of the abdomen and pelvis was performed using the standard protocol following bolus administration of intravenous contrast.  CONTRAST:  76mL OMNIPAQUE IOHEXOL 300 MG/ML  SOLN  COMPARISON:  Bone scan September 16, 2014  FINDINGS: LUNG BASES: At least moderate cardiomegaly, with cardiac pacer wires in place, no pericardial lesions. The lungs are clear. Status post median sternotomy.  SOLID ORGANS: The liver, spleen, pancreas and adrenal glands are unremarkable. Common bile duct is enlarged, 12 mm with 7 mm distal Common bile duct stone, coronal 54/112.  GASTROINTESTINAL TRACT: Mildly dilated proximal small bowel with small bowel feces measuring up to 2.8 cm, transition point RIGHT lower quadrant  associated with circumferential wall thickening. The stomach, and large bowel are normal in course and caliber without inflammatory changes. Extensive colonic diverticulosis. Enteric contrast has not yet reached the distal small bowel.  KIDNEYS/ URINARY TRACT: Kidneys are orthotopic, demonstrating symmetric enhancement on early phase, slightly delayed nephrogram on the LEFT. No nephrolithiasis, hydronephrosis or solid renal masses. Bilateral low-density renal cysts measure up to 3.2 cm in upper pole RIGHT kidney. The unopacified ureters are normal in course and caliber. Delayed imaging through the kidneys demonstrates symmetric prompt contrast excretion within the proximal urinary collecting system. Urinary bladder is well distended, with mild wall thickening.  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 No lymphadenopathy by CT size criteria. Prostate is enlarged, 5.7 x 5.4 cm, invading the base of the urinary bladder. Small to moderate free fluid in the RIGHT upper quadrant. No intraperitoneal free air.  SOFT TISSUE/OSSEOUS STRUCTURES: Rectus abdominus diastases, fatty atrophy with apposition of the small bowel to the of anterior abdominal wall associated with scarring. Grade 1 L4-5 anterolisthesis on degenerative basis.  IMPRESSION: Partial small bowel obstruction, transition point in RIGHT upper quadrant associated with small bowel wall thickening, possible adhesions.  7 mm distal Common bile duct stone with mild extra hepatic biliary dilatation.  Slightly delayed nephrogram on the LEFT concerning for Mild dysfunction, if clinically indicated, renal scintigraphy could be performed. No obstructive uropathy.  Prostatomegaly.   Electronically Signed   By: Elon Alas  On: 10/31/2014 05:55    Review of Systems  All other systems reviewed and are negative.  Blood pressure 112/82,  pulse 79, temperature 98.1 F (36.7 C), temperature source Oral, resp. rate 17, weight 125 lb 9.6 oz (56.972 kg), SpO2 92 %. Physical Exam  Constitutional: He is oriented to person, place, and time. He appears well-developed. He appears cachectic. No distress.  Cardiovascular: Exam reveals no gallop and no friction rub.   No murmur heard. S1S2 irregularly irregular rate and rhythm   Respiratory: Effort normal and breath sounds normal. No respiratory distress. He has no wheezes. He has no rales. He exhibits no tenderness.  GI: Soft. Bowel sounds are normal. He exhibits no distension. There is no tenderness. There is no rebound and no guarding.  Reducible ventral hernia on the right.  Palpable defect/hernia on the left.  NONTENDER   Musculoskeletal: Normal range of motion. He exhibits no edema or tenderness.  Neurological: He is alert and oriented to person, place, and time.  Skin: Skin is warm and dry. No rash noted. He is not diaphoretic. No erythema. No pallor.  Psychiatric: He has a normal mood and affect. His behavior is normal. Judgment and thought content normal.    Assessment/Plan: Incisional hernia's, reducible PBSO--this patient has multiple medical problems including CHF with EF 20-25%, CAD, ICM s/p ICD, MI PCI in 2015, AF/Flutter on Xarelto.  He is therefore not a surgical candidate.  At this point, I think it's reasonable to hold off on NGT placement.  If he starts to vomit, then place NGT under fluoro.  NPO x ice chips.  Repeat films in AM.  Rehydrate, repeat electrolytes in AM.  His abdomen is soft and non tender.  Hernias are reducible and non obstructive.  Hopefully with bowel rest, the obstruction will resolve.  Should he fail to open up or acutely worsen, then we will discuss further management with his POA [name] and the patient.   Would recommend clarifying code status and the goals of care with hospice since apparently he was directed to the ED by hospice.  We will follow along.   Thank you for the consult.    Haidynn Almendarez ANP-BC 10/31/2014, 7:58 AM

## 2014-10-31 NOTE — Progress Notes (Signed)
Patient arrived to [facility] room 30 via stretcher from ED.  VSS.  Wife at bedside.  Oriented them to call bell, room, department.  Pt only complaining of throat soreness from NG insertion attempts at this time.  Will cont to monitor.

## 2014-10-31 NOTE — ED Provider Notes (Signed)
CSN: [id number]     Arrival date & time 10/31/14  0208 History  This chart was scribed for Veryl Speak, MD by Rayfield Citizen, ED Scribe. This patient was seen in room D36C/D36C and the patient's care was started at 2:43 AM.    Chief Complaint  Patient presents with  . Shortness of Breath  . Abdominal Pain   The history is provided by the patient. No language interpreter was used.     HPI Comments: [name] is a 80 y.o. male with past medical history of CAD, ischemic cardiomyopathy (stent and pacemaker), HTN, HLD, DM who presents to the Emergency Department complaining of sudden onset, "sharp, cramping" generalized abdominal pain beginning around 1700 this evening. He also reports SOB.   He believes his abdominal pain may be related to eating some peanuts around 1600 today; he vomited just PTA. He has previously had a bowel resection after "ruptured bowels" but he denies any history of bowel obstruction. He denies fever, difficulty urinating, abdominal distension.  Per nurses notes, patient uses 2-3L O2 at home but it "doesn't work." He has a history of SOB under hospice care.   Past Medical History  Diagnosis Date  . CAD (coronary artery disease)     s/p CABG; s/p Pacemaker  . Systolic CHF with reduced left ventricular function, NYHA class 2   . Ischemic cardiomyopathy     s/p ICD  . Hypertension   . Hyperlipidemia   . Diabetes mellitus   . Gout   . Paroxysmal ventricular tachycardia    Past Surgical History  Procedure Laterality Date  . Coronary artery bypass graft    . Cardiac defibrillator placement      generator change 10/12/11  . Bowel resection    . Coronary angioplasty with stent placement    . Bi-ventricular implantable cardioverter defibrillator upgrade  01-22-2014    upgrade of previously implanted single chamber ICD to MDT CRTD by Dr [name] with new atrial lead placement - epicardial LV leads used  . Implantable cardioverter defibrillator (icd) generator change  Left 10/12/2011    Procedure: ICD GENERATOR CHANGE;  Surgeon: Evans Lance, MD;  Location: [facility] CATH LAB;  Service: Cardiovascular;  Laterality: Left;  . Pacemaker revision N/A 10/12/2011    Procedure: PACEMAKER REVISION;  Surgeon: Evans Lance, MD;  Location: [facility] CATH LAB;  Service: Cardiovascular;  Laterality: N/A;  . Bi-ventricular implantable cardioverter defibrillator upgrade N/A 01/22/2014    Procedure: BI-VENTRICULAR IMPLANTABLE CARDIOVERTER DEFIBRILLATOR UPGRADE;  Surgeon: Evans Lance, MD;  Location: [facility] C[facility] LAB;  Service: Cardiovascular;  Laterality: N/A;   No family history on file. History  Substance Use Topics  . Smoking status: Current Some Day Smoker    Types: Pipe, Cigars  . Smokeless tobacco: Not on file     Comment: every "now and then"  . Alcohol Use: No     Comment: occasional    Review of Systems  A complete 10 system review of systems was obtained and all systems are negative except as noted in the HPI and PMH.    Allergies  Review of patient's allergies indicates no known allergies.  Home Medications   Prior to Admission medications   Medication Sig Start Date End Date Taking? Authorizing Provider  allopurinol (ZYLOPRIM) 300 MG tablet Take 300 mg by mouth daily.      Historical Provider, MD  atorvastatin (LIPITOR) 20 MG tablet Take 20 mg by mouth daily.      Historical Provider, MD  carvedilol (COREG) 6.25 MG tablet Take 3.125 mg by mouth 2 (two) times daily.     Historical Provider, MD  docusate sodium (COLACE) 100 MG capsule Take 1 capsule (100 mg total) by mouth every 12 (twelve) hours. 01/07/14   Ruthell Rummage Dammen, PA-C  escitalopram (LEXAPRO) 10 MG tablet Take 10 mg by mouth daily.  07/11/14   Historical Provider, MD  famotidine (PEPCID) 20 MG tablet Take 20 mg by mouth 2 (two) times daily.      Historical Provider, MD  NITROSTAT 0.4 MG SL tablet Place 0.4 mg under the tongue every 5 (five) minutes as needed for chest pain (MAX 3 TABLETS).  12/05/12   Historical  Provider, MD  PATADAY 0.2 % SOLN Place 1 drop into both eyes daily. 01/14/14   Historical Provider, MD  RA ASPIRIN ADULT LOW STRENGTH 81 MG EC tablet Take 1 tablet by mouth daily. 09/01/14   Historical Provider, MD  ramipril (ALTACE) 2.5 MG capsule Take 2.5 mg by mouth daily.     Historical Provider, MD  Rivaroxaban (XARELTO) 15 MG TABS tablet Take 1 tablet (15 mg total) by mouth daily. 07/30/14   Clent Demark, MD  spironolactone (ALDACTONE) 12.5 mg TABS tablet Take 0.5 tablets (12.5 mg total) by mouth daily. 07/30/14   Clent Demark, MD  sucralfate (CARAFATE) 1 G tablet Take 1 g by mouth 3 (three) times daily.     Historical Provider, MD   BP 125/77 mmHg  Pulse 62  Temp(Src) 98.1 F (36.7 C) (Oral)  Resp 22  Wt 125 lb 9.6 oz (56.972 kg)  SpO2 99% Physical Exam  Constitutional: He is oriented to person, place, and time. He appears well-developed and well-nourished. No distress.  HENT:  Head: Normocephalic and atraumatic.  Mouth/Throat: Oropharynx is clear and moist. No oropharyngeal exudate.  Eyes: EOM are normal. Pupils are equal, round, and reactive to light.  Neck: Normal range of motion. Neck supple.  Cardiovascular: Normal rate, regular rhythm and normal heart sounds.  Exam reveals no gallop and no friction rub.   No murmur heard. Pulmonary/Chest: Effort normal. No respiratory distress. He has no wheezes. He has no rales.  Abdominal: Soft. There is tenderness (Across the upper abdomen ). There is no rebound and no guarding.  Potential central hernia that does not appear incarcerated  Musculoskeletal: Normal range of motion. He exhibits no edema.  Neurological: He is alert and oriented to person, place, and time.  Skin: Skin is warm and dry. No rash noted.  Psychiatric: He has a normal mood and affect. His behavior is normal.  Nursing note and vitals reviewed.   ED Course  Procedures   DIAGNOSTIC STUDIES: Oxygen Saturation is 99% on RA, normal by my interpretation.     COORDINATION OF CARE: 2:49 AM Discussed treatment plan with pt at bedside and pt agreed to plan.   Labs Review Labs Reviewed  CBC WITH DIFFERENTIAL/PLATELET  BASIC METABOLIC PANEL  TROPONIN I   Imaging Review No results found.   EKG Interpretation   Date/Time:  Friday October 31 2014 02:13:15 EST Ventricular Rate:  71 PR Interval:    QRS Duration: 144 QT Interval:  476 QTC Calculation: 517 R Axis:   122 Text Interpretation:  Demand pacemaker; interpretation is based on  intrinsic rhythm Abnormal ECG Confirmed by DELOS  MD, Katena Petitjean ([id number]) on  10/31/2014 2:22:43 AM     MDM   Final diagnoses:  None    Workup reveals a small bowel obstruction with  transition point in the right upper quadrant. I've spoken with Dr. Redmond Pulling from general surgery who will consult on the patient, however requests admission to the hospitalist service due to underlying medical complexity. I initially spoke with Dr. Hal Hope who with like me to speak with the [facility] medical physician on call as he is one of their patients. They are reluctant to admit due to this being a specialist problem. As the hospitalists have now changed shifts, the next hospitalist has been paged. I've spoken with Agustina Caroli who will evaluate and admit the patient.   I personally performed the services described in this documentation, which was scribed in my presence. The recorded information has been reviewed and is accurate.      Veryl Speak, MD 10/31/14 [phone]

## 2014-10-31 NOTE — ED Notes (Signed)
MD at the bedside  

## 2014-10-31 NOTE — ED Notes (Signed)
Patient has completed contrast

## 2014-10-31 NOTE — Progress Notes (Addendum)
ANTICOAGULATION CONSULT NOTE - Initial Consult  Pharmacy Consult for Heparin Indication: Bridge while off Rivaroxaban  No Known Allergies  Patient Measurements: Height: 5\' 4"  (162.6 cm) Weight: 128 lb 3.2 oz (58.151 kg) IBW/kg (Calculated) : 59.2  Vital Signs: Temp: 97.9 F (36.6 C) (02/26 0948) Temp Source: Oral (02/26 0948) BP: 116/55 mmHg (02/26 0948) Pulse Rate: 63 (02/26 0948)  Labs:  Recent Labs  10/31/14 0230  HGB 14.0  HCT 38.8*  PLT 131*  CREATININE 1.63*  TROPONINI 0.03   Estimated Creatinine Clearance: 27.3 mL/min (by C-G formula based on Cr of 1.63).  Medical History: Past Medical History  Diagnosis Date  . CAD (coronary artery disease)     s/p CABG; s/p Pacemaker  . Systolic CHF with reduced left ventricular function, NYHA class 2   . Ischemic cardiomyopathy     s/p ICD  . Hypertension   . Hyperlipidemia   . Diabetes mellitus   . Gout   . Paroxysmal ventricular tachycardia   . Diverticulosis of colon    Medications:  Prescriptions prior to admission  Medication Sig Dispense Refill Last Dose  . allopurinol (ZYLOPRIM) 300 MG tablet Take 300 mg by mouth daily.     10/30/2014 at Unknown time  . atorvastatin (LIPITOR) 20 MG tablet Take 20 mg by mouth daily.     10/30/2014 at Unknown time  . carvedilol (COREG) 6.25 MG tablet Take 3.125 mg by mouth 2 (two) times daily.    10/30/2014 at 1030  . docusate sodium (COLACE) 100 MG capsule Take 1 capsule (100 mg total) by mouth every 12 (twelve) hours. 30 capsule 0 10/30/2014 at Unknown time  . escitalopram (LEXAPRO) 10 MG tablet Take 10 mg by mouth daily.   0 10/30/2014 at Unknown time  . famotidine (PEPCID) 20 MG tablet Take 20 mg by mouth 2 (two) times daily.     10/30/2014 at Unknown time  . PATADAY 0.2 % SOLN Place 1 drop into both eyes daily.   10/30/2014 at Unknown time  . RA ASPIRIN ADULT LOW STRENGTH 81 MG EC tablet Take 1 tablet by mouth daily.  0 10/30/2014 at Unknown time  . ramipril (ALTACE) 2.5 MG  capsule Take 2.5 mg by mouth daily.    10/30/2014 at Unknown time  . Rivaroxaban (XARELTO) 15 MG TABS tablet Take 1 tablet (15 mg total) by mouth daily. (Patient taking differently: Take 15 mg by mouth every evening. ) 30 tablet 3 10/30/2014 at Unknown time  . spironolactone (ALDACTONE) 12.5 mg TABS tablet Take 0.5 tablets (12.5 mg total) by mouth daily. 30 tablet 3 10/30/2014 at Unknown time  . sucralfate (CARAFATE) 1 G tablet Take 1 g by mouth 2 (two) times daily.    10/30/2014 at Unknown time  . NITROSTAT 0.4 MG SL tablet Place 0.4 mg under the tongue every 5 (five) minutes as needed for chest pain (MAX 3 TABLETS).    rescue    Assessment: 81yo male who presents with vomiting, abdominal pain and a referral by his hospice provider.  He was found to have a partial short bowel obstruction on CT.  He is being followed by GI for possible surgery needs.  At this time, he is deemed a non-surgical candidate.  He was receiving rivaroxaban prior to admit with a dose yesterday.  We have been asked to initiate IV heparin as bridge therapy.    Goal of Therapy:  Heparin level 0.3-0.7 units/ml aPTT 66-102 seconds Monitor platelets by anticoagulation protocol: Yes  Plan:  - Will obtain a baseline aPTT and heparin level to determine heparin dosing - Start IV heparin without a bolus after labs have been drawn and adjust as needed - Daily CBC/Heparin level while on IV heparin - F/U transition back to oral anticoagulation therapy  Rober Minion, PharmD., MS Clinical Pharmacist Pager:  [phone] Thank you for allowing pharmacy to be part of this patients care team. 10/31/2014,10:44 AM    ADDENDUM "stat" HL was drawn at 1130, aPTT wasn't drawn until 1445. HL was elevated, as expected, at 0.78 units/mL. apTT was a little low at 54 seconds. Heparin is currently running at 600 units/hr.  Plan: -increase heparin gtt to 750 units/hr -recheck HL and aPTT in 8 hours at midnight  Breyer Tejera D. Kalaya Infantino, PharmD,  BCPS Clinical Pharmacist Pager: [phone] 10/31/2014 3:40 PM

## 2014-10-31 NOTE — ED Notes (Signed)
Patient presents with c/o SOB and abd pain.  Uses oxygen 2-3 liters at home but "it's not working"  Also SOB with history of CHF under Hospice care

## 2014-10-31 NOTE — ED Notes (Signed)
Patient actual weight 125.6 lb  Patient is a DNR per POA

## 2014-10-31 NOTE — ED Notes (Signed)
Dr.Delo aware NG was unsuccessful.

## 2014-10-31 NOTE — Care Management Note (Signed)
  Page 1 of 1   10/31/2014     12:05:21 PM CARE MANAGEMENT NOTE 10/31/2014  Patient:  SYLVESTER, MINTON   Account Number:  [id number]  Date Initiated:  10/31/2014  Documentation initiated by:  Magdalen Spatz  Subjective/Objective Assessment:     Action/Plan:   Anticipated DC Date:     Anticipated DC Plan:  HOME W [facility] CARE         Choice offered to / List presented to:             Status of service:   Medicare Important Message given?   (If response is "NO", the following Medicare IM given date fields will be blank) Date Medicare IM given:   Medicare IM given by:   Date Additional Medicare IM given:   Additional Medicare IM given by:    Discharge Disposition:    Per UR Regulation:    If discussed at Long Length of Stay Meetings, dates discussed:    Comments:  10-31-14 Patient has services through [facility] and Hospice phone [phone] fax [phone].  Spoke to Watrous at [facility] and Hospice , she is aware patient is inpatient at [facility] and his nurse will visit this afternoon. Magdalen Spatz RN BSN

## 2014-11-01 ENCOUNTER — Inpatient Hospital Stay (HOSPITAL_COMMUNITY): Payer: Medicare Other

## 2014-11-01 DIAGNOSIS — I484 Atypical atrial flutter: Secondary | ICD-10-CM

## 2014-11-01 DIAGNOSIS — K5669 Other intestinal obstruction: Secondary | ICD-10-CM

## 2014-11-01 DIAGNOSIS — I5022 Chronic systolic (congestive) heart failure: Secondary | ICD-10-CM

## 2014-11-01 LAB — GLUCOSE, CAPILLARY
GLUCOSE-CAPILLARY: 83 mg/dL (ref 70–99)
GLUCOSE-CAPILLARY: 99 mg/dL (ref 70–99)
Glucose-Capillary: 110 mg/dL — ABNORMAL HIGH (ref 70–99)
Glucose-Capillary: 215 mg/dL — ABNORMAL HIGH (ref 70–99)
Glucose-Capillary: 91 mg/dL (ref 70–99)
Glucose-Capillary: 92 mg/dL (ref 70–99)
Glucose-Capillary: 99 mg/dL (ref 70–99)

## 2014-11-01 LAB — BASIC METABOLIC PANEL
Anion gap: 10 (ref 5–15)
BUN: 26 mg/dL — ABNORMAL HIGH (ref 6–23)
CHLORIDE: 105 mmol/L (ref 96–112)
CO2: 20 mmol/L (ref 19–32)
Calcium: 9 mg/dL (ref 8.4–10.5)
Creatinine, Ser: 1.22 mg/dL (ref 0.50–1.35)
GFR calc Af Amer: 61 mL/min — ABNORMAL LOW (ref >90)
GFR calc non Af Amer: 52 mL/min — ABNORMAL LOW (ref >90)
Glucose, Bld: 94 mg/dL (ref 70–99)
POTASSIUM: 4.2 mmol/L (ref 3.5–5.1)
SODIUM: 135 mmol/L (ref 135–145)

## 2014-11-01 LAB — APTT
aPTT: 137 seconds — ABNORMAL HIGH (ref 24–37)
aPTT: 109 seconds — ABNORMAL HIGH (ref 24–37)

## 2014-11-01 LAB — HEMOGLOBIN A1C
Hgb A1c MFr Bld: 6.6 % — ABNORMAL HIGH (ref 4.8–5.6)
Mean Plasma Glucose: 143 mg/dL

## 2014-11-01 LAB — HEPARIN LEVEL (UNFRACTIONATED)
HEPARIN UNFRACTIONATED: 0.61 [IU]/mL (ref 0.30–0.70)
HEPARIN UNFRACTIONATED: 0.74 [IU]/mL — AB (ref 0.30–0.70)

## 2014-11-01 MED ORDER — HEPARIN (PORCINE) IN NACL 100-0.45 UNIT/ML-% IJ SOLN
600.0000 [IU]/h | INTRAMUSCULAR | Status: DC
  Administered 2014-11-01 – 2014-11-02 (×2): 600 [IU]/h via INTRAVENOUS
  Filled 2014-11-01: qty 250

## 2014-11-01 MED ORDER — PHENOL 1.4 % MT LIQD
1.0000 | OROMUCOSAL | Status: DC | PRN
  Administered 2014-11-01: 1 via OROMUCOSAL
  Filled 2014-11-01: qty 177

## 2014-11-01 NOTE — Progress Notes (Signed)
INITIAL NUTRITION ASSESSMENT  DOCUMENTATION CODES Per approved criteria  -Severe malnutrition in the context of acute illness or injury   Pt meets criteria for severe MALNUTRITION in the context of acute illness as evidenced by mild to moderate fat and muscle depletion.  INTERVENTION: -RD will follow for diet advancement -Supplement diet as appropriate  NUTRITION DIAGNOSIS: Inadequate oral intake related to altered GI function as evidenced by NPO.   Goal: Pt will meet >90% of estimated nutritional needs  Monitor:  Diet advancement, PO/supplement intake, labs, weight changes, I/O's  Reason for Assessment: MST=3  81 y.o. male  Admitting Dx: Partial small bowel obstruction  [name] is a 81 y.o. male, with a past medical history of systolic heart failure (EF 20-25% in November/2015), diet controlled diabetes mellitus, a flutter (on Xarelto), and history of perforated bowel obstruction approximately 20 years ago. Hospice visits him at home once a week. Otherwise he is at home alone and checked on by friends. He presents to the ER with severe abdominal pain that started 2/25. He had one episode of vomiting yesterday after eating fried chicken, peanuts, and apples. He has had regular loose bowel movements.  ASSESSMENT: Pt admitted with partial SBO. He is active with home hospice for weight loss and heart failure.   Pt is passing flatus, but no BM yet. NGT not placed. Per surgery notes, pt is not a surgical candidate. Per MD notes, hopeful resolution of SBO with bowel rest.  Spoke with pt at bedside who reports ongoing weight loss for over the past year. Wt hx reveals a 4.7% wt loss over the past 3 months. He reports his appetite is very at baseline, most recently complaining of early satiety. He receives Meals on Wheels during the week and he goes out to eat or eats food prepared by friends on weekends. He denies any difficulty chewing or swallowing, but reports "I can never eat all  that food they give me". He also reports he drinks Ensure at home, but complains it has given him gas recently.  He reports he has noticed progressive weight loss, especially after seeing his "bony chicken legs".  Labs reviewed. BUN: 26.   Nutrition Focused Physical Exam:  Subcutaneous Fat:  Orbital Region: moderate depletion Upper Arm Region: moderate depletion Thoracic and Lumbar Region: mild depletion  Muscle:  Temple Region: moderate depletion Clavicle Bone Region: moderate depletion Clavicle and Acromion Bone Region: moderate depletion Scapular Bone Region: mild depletion Dorsal Hand: severe depletion Patellar Region: severe depletion Anterior Thigh Region: moderate depletion Posterior Calf Region: moderate depletion  Edema: none present  Height: Ht Readings from Last 1 Encounters:  10/31/14 5\' 4"  (1.626 m)    Weight: Wt Readings from Last 1 Encounters:  11/01/14 140 lb 12.8 oz (63.866 kg)    Ideal Body Weight: 130#  % Ideal Body Weight: 108%  Wt Readings from Last 10 Encounters:  11/01/14 140 lb 12.8 oz (63.866 kg)  09/02/14 153 lb 3.2 oz (69.491 kg)  07/30/14 147 lb 0.8 oz (66.7 kg)  03/11/14 145 lb 6.4 oz (65.953 kg)  01/27/14 154 lb 1.6 oz (69.899 kg)  02/15/13 140 lb 12.8 oz (63.866 kg)  02/12/13 142 lb 6.4 oz (64.592 kg)  06/22/12 157 lb (71.215 kg)  02/25/12 159 lb (72.122 kg)  02/02/12 152 lb (68.947 kg)    Usual Body Weight: 155#  % Usual Body Weight: 90%  BMI:  Body mass index is 24.16 kg/(m^2). Normal weight range  Estimated Nutritional Needs: Kcal: 1600-1800  Protein: 70-80 grams Fluid: 1.6-1.8 L  Skin: WDL  Diet Order: Diet NPO time specified Except for: Sips with Meds  EDUCATION NEEDS: -Education not appropriate at this time   Intake/Output Summary (Last 24 hours) at 11/01/14 0905 Last data filed at 11/01/14 0553  Gross per 24 hour  Intake 1042.33 ml  Output    625 ml  Net 417.33 ml    Last BM: 10/31/14  Labs:   Recent  Labs Lab 10/31/14 0230 11/01/14 0023  NA 132* 135  K 4.4 4.2  CL 98 105  CO2 23 20  BUN 33* 26*  CREATININE 1.63* 1.22  CALCIUM 9.7 9.0  GLUCOSE 173* 94    CBG (last 3)   Recent Labs  11/01/14 0004 11/01/14 0354 11/01/14 0736  GLUCAP 91 92 83    Scheduled Meds: . aspirin EC  81 mg Oral Daily  . carvedilol  3.125 mg Oral BID  . escitalopram  10 mg Oral Daily  . famotidine  20 mg Oral BID  . insulin aspart  0-9 Units Subcutaneous 6 times per day  . insulin glargine  5 Units Subcutaneous QHS  . olopatadine  1 drop Both Eyes BID  . ramipril  2.5 mg Oral Daily    Continuous Infusions: . sodium chloride 50 mL/hr at 11/01/14 0558  . heparin 600 Units/hr (11/01/14 0227)    Past Medical History  Diagnosis Date  . CAD (coronary artery disease)     s/p CABG; s/p Pacemaker  . Systolic CHF with reduced left ventricular function, NYHA class 2   . Ischemic cardiomyopathy     s/p ICD  . Hypertension   . Hyperlipidemia   . Gout   . Paroxysmal ventricular tachycardia   . Diverticulosis of colon   . AICD (automatic cardioverter/defibrillator) present   . Arthritis     "shoulders" (10/31/2014)  . On home oxygen therapy     "2L prn" (10/31/2014)    Past Surgical History  Procedure Laterality Date  . Coronary artery bypass graft  03/2003    CABG X3/notes 01/18/2011  . Bowel resection    . Coronary angioplasty with stent placement  04/2011    2 stents/notes 05/03/2011  . Implantable cardioverter defibrillator (icd) generator change Left 10/12/2011    Procedure: ICD GENERATOR CHANGE;  Surgeon: Evans Lance, MD;  Location: [facility] CATH LAB;  Service: Cardiovascular;  Laterality: Left;  . Pacemaker revision N/A 10/12/2011    Procedure: PACEMAKER REVISION;  Surgeon: Evans Lance, MD;  Location: [facility] CATH LAB;  Service: Cardiovascular;  Laterality: N/A;  . Bi-ventricular implantable cardioverter defibrillator upgrade N/A 01/22/2014    Procedure: BI-VENTRICULAR IMPLANTABLE CARDIOVERTER  DEFIBRILLATOR UPGRADE;  Surgeon: Evans Lance, MD;  Location: [facility] CATH LAB;  Service: Cardiovascular;  Laterality: N/A;  . Tonsillectomy    . Cholecystectomy    . Cataract extraction w/ intraocular lens  implant, bilateral Bilateral   . Tee with cardioversion  03/2003    Archie Endo 01/18/2011  . Cardiac defibrillator placement  07/2003    Archie Endo 01/18/2011  . Cardiac catheterization  03/2011    Archie Endo 01/18/2011  . Pterygium excision Left 03/2011    Archie Endo 03/31/2011   Amado Andal A. Jimmye Norman, RD, LDN, CDE Pager: [phone] After hours Pager: [phone]

## 2014-11-01 NOTE — Progress Notes (Signed)
ANTICOAGULATION CONSULT NOTE - Follow up [place] for Heparin Indication: Bridge while off Rivaroxaban  No Known Allergies  Patient Measurements: Height: 5\' 4"  (162.6 cm) Weight: 140 lb 12.8 oz (63.866 kg) IBW/kg (Calculated) : 59.2  Vital Signs: Temp: 98.2 F (36.8 C) (02/27 0550) Temp Source: Oral (02/27 0550) BP: 102/56 mmHg (02/27 0550) Pulse Rate: 60 (02/27 0550)  Labs:  Recent Labs  10/31/14 0230 10/31/14 1135 10/31/14 1445 11/01/14 0023 11/01/14 0928  HGB 14.0  --   --   --   --   HCT 38.8*  --   --   --   --   PLT 131*  --   --   --   --   APTT  --   --  54* 137* 109*  HEPARINUNFRC  --  0.78*  --  0.74* 0.61  CREATININE 1.63*  --   --  1.22  --   TROPONINI 0.03  --   --   --   --    Estimated Creatinine Clearance: 37.1 mL/min (by C-G formula based on Cr of 1.22).  Medical History: Past Medical History  Diagnosis Date  . CAD (coronary artery disease)     s/p CABG; s/p Pacemaker  . Systolic CHF with reduced left ventricular function, NYHA class 2   . Ischemic cardiomyopathy     s/p ICD  . Hypertension   . Hyperlipidemia   . Gout   . Paroxysmal ventricular tachycardia   . Diverticulosis of colon   . AICD (automatic cardioverter/defibrillator) present   . Arthritis     "shoulders" (10/31/2014)  . On home oxygen therapy     "2L prn" (10/31/2014)   Medications:  Prescriptions prior to admission  Medication Sig Dispense Refill Last Dose  . allopurinol (ZYLOPRIM) 300 MG tablet Take 300 mg by mouth daily.     10/30/2014 at Unknown time  . atorvastatin (LIPITOR) 20 MG tablet Take 20 mg by mouth daily.     10/30/2014 at Unknown time  . carvedilol (COREG) 6.25 MG tablet Take 3.125 mg by mouth 2 (two) times daily.    10/30/2014 at 1030  . docusate sodium (COLACE) 100 MG capsule Take 1 capsule (100 mg total) by mouth every 12 (twelve) hours. 30 capsule 0 10/30/2014 at Unknown time  . escitalopram (LEXAPRO) 10 MG tablet Take 10 mg by mouth daily.    0 10/30/2014 at Unknown time  . famotidine (PEPCID) 20 MG tablet Take 20 mg by mouth 2 (two) times daily.     10/30/2014 at Unknown time  . PATADAY 0.2 % SOLN Place 1 drop into both eyes daily.   10/30/2014 at Unknown time  . RA ASPIRIN ADULT LOW STRENGTH 81 MG EC tablet Take 1 tablet by mouth daily.  0 10/30/2014 at Unknown time  . ramipril (ALTACE) 2.5 MG capsule Take 2.5 mg by mouth daily.    10/30/2014 at Unknown time  . Rivaroxaban (XARELTO) 15 MG TABS tablet Take 1 tablet (15 mg total) by mouth daily. (Patient taking differently: Take 15 mg by mouth every evening. ) 30 tablet 3 10/30/2014 at Unknown time  . spironolactone (ALDACTONE) 12.5 mg TABS tablet Take 0.5 tablets (12.5 mg total) by mouth daily. 30 tablet 3 10/30/2014 at Unknown time  . sucralfate (CARAFATE) 1 G tablet Take 1 g by mouth 2 (two) times daily.    10/30/2014 at Unknown time  . NITROSTAT 0.4 MG SL tablet Place 0.4 mg under the tongue every 5 (  five) minutes as needed for chest pain (MAX 3 TABLETS).    rescue   Assessment: APTT 109 seconds on IV heparin 600 units/hr.  HL 0.61   81yo male who presentecd with vomiting, abdominal pain and a referral by his hospice provider.  He was found to have a partial short bowel obstruction on CT.  He is being followed by GI for possible surgery needs.  At this time, he is deemed a non-surgical candidate.  He was receiving rivaroxaban prior to admit with a dose on 10/30/14.    Goal of Therapy:  Heparin level 0.3-0.7 units/ml aPTT 66-102 seconds Monitor platelets by anticoagulation protocol: Yes   Plan:  - continue IV heparin gtt at 600 units/hr - recheck HL in AM and discontinue aPTT checks  - Daily CBC/Heparin level while on IV heparin - F/U transition back to oral anticoagulation therapy  Rober Minion, PharmD., MS Clinical Pharmacist Pager:  [phone] Thank you for allowing pharmacy to be part of this patients care team. 11/01/2014,11:05 AM

## 2014-11-01 NOTE — Progress Notes (Signed)
[facility] PROGRESS NOTE  [name] MOQ:[id number] DOB: 1927-03-23 DOA: 10/31/2014 PCP: [name], MD  Assessment/Plan: 81 y.o. male with PMH of CHF,systolic HF (EF 35-46%), DM, CAD s/p CABG, ICD, A flutter (on Xarelto), and history of perforated bowel obstruction approximately 20 years ago presented with with abdominal pain and vomiting and admitted with SOB -patient is under hospice at home   1. Partial small bowel obstruction; CT :Partial small bowel obstruction, transition point in RIGHT upper quadrant associated with small bowel wall thickening, possible adhesions. -repeat KUB: Improving partial small bowel obstruction; no NG, will cont NPO, no BM yet, but has BS, + flatus' appreciate surgery input   2. CBD stone; LFTs are unremarkable; CT scan shows 7 mm CBD stone with mild extra hepatic biliary dilation. LFTs normal. Per Discussions with Dr. Paulita Fujita (on call unassigned) who states he would just monitor for now. -If patient's abdominal pain does not improve with resolution of PSBO. Please call GI back. 3. Chronic systolic CHF with EF of 20 - 25%; clinically euvolemic; holding ACE, diuretics with acute SBO -close monitor clinically while on IVF, holding lasix, will resume as needed;  4. Afib / Aflutter. History of Complete Heart Block, s/p ICD -Patient normally on Xeralto.currently on heparin per pharmacy; will resume when able to take PO    Code Status: DNR Family Communication: d/w patient, family at the bedside (indicate person spoken with, relationship, and if by phone, the number) Disposition Plan: home pend clinical improvement    Consultants:  Surgery   Procedures:  none  Antibiotics:  none (indicate start date, and stop date if known)  HPI/Subjective: alert  Objective: Filed Vitals:   11/01/14 0550  BP: 102/56  Pulse: 60  Temp: 98.2 F (36.8 C)  Resp: 20    Intake/Output Summary (Last 24 hours) at 11/01/14 1203 Last data filed at  11/01/14 0800  Gross per 24 hour  Intake 1042.33 ml  Output    450 ml  Net 592.33 ml   Filed Weights   10/31/14 0222 10/31/14 0948 11/01/14 0550  Weight: 56.972 kg (125 lb 9.6 oz) 58.151 kg (128 lb 3.2 oz) 63.866 kg (140 lb 12.8 oz)    Exam:   General:  alert  Cardiovascular: s1,s2 rrr  Respiratory: few crackles LL  Abdomen: soft, nt,nd   Musculoskeletal: no Leg edema   Data Reviewed: Basic Metabolic Panel:  Recent Labs Lab 10/31/14 0230 11/01/14 0023  NA 132* 135  K 4.4 4.2  CL 98 105  CO2 23 20  GLUCOSE 173* 94  BUN 33* 26*  CREATININE 1.63* 1.22  CALCIUM 9.7 9.0   Liver Function Tests:  Recent Labs Lab 10/31/14 0230  AST 32  ALT 24  ALKPHOS 108  BILITOT 1.0  PROT 7.7  ALBUMIN 4.4    Recent Labs Lab 10/31/14 0230  LIPASE 31   No results for input(s): AMMONIA in the last 168 hours. CBC:  Recent Labs Lab 10/31/14 0230  WBC 6.6  NEUTROABS 5.0  HGB 14.0  HCT 38.8*  MCV 89.0  PLT 131*   Cardiac Enzymes:  Recent Labs Lab 10/31/14 0230  TROPONINI 0.03   BNP (last 3 results) No results for input(s): BNP in the last 8760 hours.  ProBNP (last 3 results)  Recent Labs  01/21/14 1348 01/21/14 2245 07/26/14 1608  PROBNP 3731.0* 3379.0* 2011.0*    CBG:  Recent Labs Lab 10/31/14 1940 11/01/14 0004 11/01/14 0354 11/01/14 0736 11/01/14 1144  GLUCAP 135* 91 92 83  99    No results found for this or any previous visit (from the past 240 hour(s)).   Studies: Dg Chest 2 View  10/31/2014   CLINICAL DATA:  Difficulty breathing  EXAM: CHEST  2 VIEW  COMPARISON:  July 26, 2014  FINDINGS: There is mild scarring in the bases. There is underlying emphysematous change. There is no edema or consolidation. Heart is borderline prominent with pulmonary vascularity within normal limits. Patient is status post coronary artery bypass grafting. Pacemaker leads are attached to the right atrium, right ventricle, and left ventricle. No bone  lesions. No adenopathy. Aorta is somewhat tortuous with atherosclerotic change in the aorta.  IMPRESSION: Underlying emphysema with mild bibasilar scarring. No edema or consolidation. Heart prominent but stable.   Electronically Signed   By: Lowella Grip III M.D.   On: 10/31/2014 08:38   Ct Abdomen Pelvis W Contrast  10/31/2014   CLINICAL DATA:  Lower abdominal pain, nausea beginning at 5 p.m. yesterday. Shortness of breath. Recent weight loss. History of prostate cancer.  EXAM: CT ABDOMEN AND PELVIS WITH CONTRAST  TECHNIQUE: Multidetector CT imaging of the abdomen and pelvis was performed using the standard protocol following bolus administration of intravenous contrast.  CONTRAST:  19mL OMNIPAQUE IOHEXOL 300 MG/ML  SOLN  COMPARISON:  Bone scan September 16, 2014  FINDINGS: LUNG BASES: At least moderate cardiomegaly, with cardiac pacer wires in place, no pericardial lesions. The lungs are clear. Status post median sternotomy.  SOLID ORGANS: The liver, spleen, pancreas and adrenal glands are unremarkable. Common bile duct is enlarged, 12 mm with 7 mm distal Common bile duct stone, coronal 54/112.  GASTROINTESTINAL TRACT: Mildly dilated proximal small bowel with small bowel feces measuring up to 2.8 cm, transition point RIGHT lower quadrant associated with circumferential wall thickening. The stomach, and large bowel are normal in course and caliber without inflammatory changes. Extensive colonic diverticulosis. Enteric contrast has not yet reached the distal small bowel.  KIDNEYS/ URINARY TRACT: Kidneys are orthotopic, demonstrating symmetric enhancement on early phase, slightly delayed nephrogram on the LEFT. No nephrolithiasis, hydronephrosis or solid renal masses. Bilateral low-density renal cysts measure up to 3.2 cm in upper pole RIGHT kidney. The unopacified ureters are normal in course and caliber. Delayed imaging through the kidneys demonstrates symmetric prompt contrast excretion within the proximal  urinary collecting system. Urinary bladder is well distended, with mild wall thickening.  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 No lymphadenopathy by CT size criteria. Prostate is enlarged, 5.7 x 5.4 cm, invading the base of the urinary bladder. Small to moderate free fluid in the RIGHT upper quadrant. No intraperitoneal free air.  SOFT TISSUE/OSSEOUS STRUCTURES: Rectus abdominus diastases, fatty atrophy with apposition of the small bowel to the of anterior abdominal wall associated with scarring. Grade 1 L4-5 anterolisthesis on degenerative basis.  IMPRESSION: Partial small bowel obstruction, transition point in RIGHT upper quadrant associated with small bowel wall thickening, possible adhesions.  7 mm distal Common bile duct stone with mild extra hepatic biliary dilatation.  Slightly delayed nephrogram on the LEFT concerning for Mild dysfunction, if clinically indicated, renal scintigraphy could be performed. No obstructive uropathy.  Prostatomegaly.   Electronically Signed   By: Elon Alas   On: 10/31/2014 05:55   Dg Abd 2 Views  11/01/2014   CLINICAL DATA:  A partial small bowel obstruction  EXAM: ABDOMEN - 2 VIEW  COMPARISON:  10/31/2014  FINDINGS: Contrast material  is now seen within the colon consistent with passage from the prior CT examination. This would suggest that the previously seen obstruction is only partial in nature. The degree of small bowel dilatation has improved slightly in the interval from the prior exam. No free air is noted.  IMPRESSION: Improving partial small bowel obstruction. Continued followup is recommended.   Electronically Signed   By: Inez Catalina M.D.   On: 11/01/2014 10:06    Scheduled Meds: . aspirin EC  81 mg Oral Daily  . carvedilol  3.125 mg Oral BID  . escitalopram  10 mg Oral Daily  . famotidine  20 mg Oral BID  . insulin  aspart  0-9 Units Subcutaneous 6 times per day  . insulin glargine  5 Units Subcutaneous QHS  . olopatadine  1 drop Both Eyes BID  . ramipril  2.5 mg Oral Daily   Continuous Infusions: . sodium chloride 50 mL/hr at 11/01/14 2836  . heparin 600 Units/hr (11/01/14 0227)    Principal Problem:   Partial small bowel obstruction Active Problems:   Chronic systolic heart failure   Automatic implantable cardioverter-defibrillator in situ   Atrial flutter   Diabetes mellitus   Acute renal failure   Do not resuscitate   Small bowel obstruction due to postoperative adhesions   Acute kidney injury   Cardiomyopathy, ischemic    Time spent: >35 minutes     Kinnie Feil  [facility] Pager [phone]. If 7PM-7AM, please contact night-coverage at [email or web address], password [facility] 11/01/2014, 12:03 PM  LOS: 1 day

## 2014-11-01 NOTE — Progress Notes (Signed)
Subjective: Pt with no complaints this AM.  States he's passing flatus.  No BMs NGT not placed  Objective: Vital signs in last 24 hours: Temp:  [97.9 F (36.6 C)-98.2 F (36.8 C)] 98.2 F (36.8 C) (02/27 0550) Pulse Rate:  [52-68] 60 (02/27 0550) Resp:  [16-20] 20 (02/27 0550) BP: (91-118)/(53-62) 102/56 mmHg (02/27 0550) SpO2:  [97 %-100 %] 97 % (02/27 0550) Weight:  [128 lb 3.2 oz (58.151 kg)-140 lb 12.8 oz (63.866 kg)] 140 lb 12.8 oz (63.866 kg) (02/27 0550) Last BM Date: 10/31/14  Intake/Output from previous day: 02/26 0701 - 02/27 0700 In: 1042.3 [I.V.:1042.3] Out: 825 [Urine:825] Intake/Output this shift:    General appearance: alert and cooperative GI: soft, reducible RLQ hernia, hypoactive BS, soft, NTTP  Lab Results:   Recent Labs  10/31/14 0230  WBC 6.6  HGB 14.0  HCT 38.8*  PLT 131*   BMET  Recent Labs  10/31/14 0230 11/01/14 0023  NA 132* 135  K 4.4 4.2  CL 98 105  CO2 23 20  GLUCOSE 173* 94  BUN 33* 26*  CREATININE 1.63* 1.22  CALCIUM 9.7 9.0   PT/INR No results for input(s): LABPROT, INR in the last 72 hours. ABG No results for input(s): PHART, HCO3 in the last 72 hours.  Invalid input(s): PCO2, PO2  Studies/Results: Dg Chest 2 View  10/31/2014   CLINICAL DATA:  Difficulty breathing  EXAM: CHEST  2 VIEW  COMPARISON:  July 26, 2014  FINDINGS: There is mild scarring in the bases. There is underlying emphysematous change. There is no edema or consolidation. Heart is borderline prominent with pulmonary vascularity within normal limits. Patient is status post coronary artery bypass grafting. Pacemaker leads are attached to the right atrium, right ventricle, and left ventricle. No bone lesions. No adenopathy. Aorta is somewhat tortuous with atherosclerotic change in the aorta.  IMPRESSION: Underlying emphysema with mild bibasilar scarring. No edema or consolidation. Heart prominent but stable.   Electronically Signed   By: Lowella Grip  III M.D.   On: 10/31/2014 08:38   Ct Abdomen Pelvis W Contrast  10/31/2014   CLINICAL DATA:  Lower abdominal pain, nausea beginning at 5 p.m. yesterday. Shortness of breath. Recent weight loss. History of prostate cancer.  EXAM: CT ABDOMEN AND PELVIS WITH CONTRAST  TECHNIQUE: Multidetector CT imaging of the abdomen and pelvis was performed using the standard protocol following bolus administration of intravenous contrast.  CONTRAST:  90mL OMNIPAQUE IOHEXOL 300 MG/ML  SOLN  COMPARISON:  Bone scan September 16, 2014  FINDINGS: LUNG BASES: At least moderate cardiomegaly, with cardiac pacer wires in place, no pericardial lesions. The lungs are clear. Status post median sternotomy.  SOLID ORGANS: The liver, spleen, pancreas and adrenal glands are unremarkable. Common bile duct is enlarged, 12 mm with 7 mm distal Common bile duct stone, coronal 54/112.  GASTROINTESTINAL TRACT: Mildly dilated proximal small bowel with small bowel feces measuring up to 2.8 cm, transition point RIGHT lower quadrant associated with circumferential wall thickening. The stomach, and large bowel are normal in course and caliber without inflammatory changes. Extensive colonic diverticulosis. Enteric contrast has not yet reached the distal small bowel.  KIDNEYS/ URINARY TRACT: Kidneys are orthotopic, demonstrating symmetric enhancement on early phase, slightly delayed nephrogram on the LEFT. No nephrolithiasis, hydronephrosis or solid renal masses. Bilateral low-density renal cysts measure up to 3.2 cm in upper pole RIGHT kidney. The unopacified ureters are normal in course and caliber. Delayed imaging through the kidneys demonstrates symmetric prompt contrast  excretion within the proximal urinary collecting system. Urinary bladder is well distended, with mild wall thickening.  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  No lymphadenopathy by CT size criteria. Prostate is enlarged, 5.7 x 5.4 cm, invading the base of the urinary bladder. Small to moderate free fluid in the RIGHT upper quadrant. No intraperitoneal free air.  SOFT TISSUE/OSSEOUS STRUCTURES: Rectus abdominus diastases, fatty atrophy with apposition of the small bowel to the of anterior abdominal wall associated with scarring. Grade 1 L4-5 anterolisthesis on degenerative basis.  IMPRESSION: Partial small bowel obstruction, transition point in RIGHT upper quadrant associated with small bowel wall thickening, possible adhesions.  7 mm distal Common bile duct stone with mild extra hepatic biliary dilatation.  Slightly delayed nephrogram on the LEFT concerning for Mild dysfunction, if clinically indicated, renal scintigraphy could be performed. No obstructive uropathy.  Prostatomegaly.   Electronically Signed   By: Elon Alas   On: 10/31/2014 05:55    Anti-infectives: Anti-infectives    None      Assessment/Plan: 81 y/o M: Principal Problem:   Partial small bowel obstruction Active Problems:   Chronic systolic heart failure   Automatic implantable cardioverter-defibrillator in situ   Atrial flutter   Diabetes mellitus   Acute renal failure   Do not resuscitate   Small bowel obstruction due to postoperative adhesions   Acute kidney injury   Cardiomyopathy, ischemic  Abd is very soft.  I would expect this to resolve non-op. KUB pending this AM, will f/u Mobilize Con't NPO    LOS: 1 day    Rosario Jacks., Anne Hahn 11/01/2014

## 2014-11-01 NOTE — Evaluation (Signed)
Physical Therapy Evaluation Patient Details Name: [name] MRN: [id number] DOB: 1927/05/10 Today's Date: 11/01/2014   History of Present Illness  Patient is an 81 yo male with severe abdominal pain that started 2/25. He had one episode of vomiting and determined that he has SBO.   Clinical Impression  Patient with generalized mobility deficits who was reliant on [facility] prior to admission. He now presents with staggering gait pattern, decr gait distances and reliance on external support for dynamic balance.  Patient may benefit from skilled PT on this level of care to prepare for safe discharge home alone and to progress mobility and activity tolerance.    Follow Up Recommendations Home health PT;Supervision - Intermittent    Equipment Recommendations  None recommended by PT    Recommendations for Other Services       Precautions / Restrictions Precautions Precautions: Fall Precaution Comments: unsteady gait Restrictions Weight Bearing Restrictions: No      Mobility  Bed Mobility Overal bed mobility: Independent             General bed mobility comments: to right side of bed  Transfers Overall transfer level: Needs assistance Equipment used: Straight cane Transfers: Sit to/from Stand;Stand Pivot Transfers Sit to Stand: Supervision Stand pivot transfers: Min guard (toilet transfer)          Ambulation/Gait Ambulation/Gait assistance: Min assist Ambulation Distance (Feet): 150 Feet Assistive device: Straight cane Gait Pattern/deviations: Staggering left;Wide base of support     General Gait Details: may benefit from RW during gait to improve safety  Stairs            Wheelchair Mobility    Modified Rankin (Stroke Patients Only)       Balance Overall balance assessment: Needs assistance Sitting-balance support: No upper extremity supported;Feet supported Sitting balance-Leahy Scale: Normal     Standing balance support: Single extremity  supported Standing balance-Leahy Scale: Fair                               Pertinent Vitals/Pain Pain Assessment: No/denies pain    Home Living Family/patient expects to be discharged to:: Private residence Living Arrangements: Alone Available Help at Discharge: Friend(s);Available PRN/intermittently Type of Home: Apartment Home Access: Level entry     Home Layout: One level Home Equipment: Walker - 2 wheels;Wheelchair - Regulatory affairs officer - single point;Bedside commode;Grab bars - tub/shower Additional Comments: takes Meals on Wheels 5d/week    Prior Function Level of Independence: Independent with assistive device(s)         Comments: his POA assists with instrumental ADLs, used SPC for gait     Hand Dominance   Dominant Hand: Right    Extremity/Trunk Assessment   Upper Extremity Assessment: Overall WFL for tasks assessed           Lower Extremity Assessment: Overall WFL for tasks assessed      Cervical / Trunk Assessment: Normal  Communication   Communication: No difficulties  Cognition Arousal/Alertness: Awake/alert Behavior During Therapy: WFL for tasks assessed/performed Overall Cognitive Status: Within Functional Limits for tasks assessed                      General Comments      Exercises        Assessment/Plan    PT Assessment Patient needs continued PT services  PT Diagnosis Difficulty walking;Generalized weakness;Acute pain   PT Problem List Decreased activity tolerance;Decreased balance;Decreased  mobility;Decreased safety awareness  PT Treatment Interventions DME instruction;Gait training;Functional mobility training;Therapeutic activities;Balance training;Patient/family education   PT Goals (Current goals can be found in the Care Plan section) Acute Rehab PT Goals Patient Stated Goal: regain strength, be able to eat PT Goal Formulation: With patient Time For Goal Achievement: 11/08/14 Potential to Achieve  Goals: Good    Frequency Min 3X/week   Barriers to discharge Decreased caregiver support has intermittent assist from friends    Co-evaluation               End of Session   Activity Tolerance: Patient tolerated treatment well Patient left: in chair;with call bell/phone within reach           Time: 1203-1243 PT Time Calculation (min) (ACUTE ONLY): 40 min   Charges:   PT Evaluation $Initial PT Evaluation Tier I: 1 Procedure PT Treatments $Gait Training: 8-22 mins   PT G CodesMalka So, PT [phone] [place] 11/01/2014, 3:22 PM

## 2014-11-01 NOTE — Progress Notes (Deleted)
S/W [facility] to make sure resumption of [facility] is in process and they have everything they need.  They confirmed this and will visit pt tomorrow.

## 2014-11-01 NOTE — Progress Notes (Signed)
ANTICOAGULATION CONSULT NOTE - Follow up [place] for Heparin Indication: Bridge while off Rivaroxaban  No Known Allergies  Patient Measurements: Height: 5\' 4"  (162.6 cm) Weight: 128 lb 3.2 oz (58.151 kg) IBW/kg (Calculated) : 59.2  Vital Signs: Temp: 97.9 F (36.6 C) (02/26 2236) Temp Source: Oral (02/26 2236) BP: 91/53 mmHg (02/26 2236) Pulse Rate: 52 (02/26 2236)  Labs:  Recent Labs  10/31/14 0230 10/31/14 1135 10/31/14 1445 11/01/14 0023  HGB 14.0  --   --   --   HCT 38.8*  --   --   --   PLT 131*  --   --   --   APTT  --   --  54* 137*  HEPARINUNFRC  --  0.78*  --  0.74*  CREATININE 1.63*  --   --  1.22  TROPONINI 0.03  --   --   --    Estimated Creatinine Clearance: 36.4 mL/min (by C-G formula based on Cr of 1.22).  Medical History: Past Medical History  Diagnosis Date  . CAD (coronary artery disease)     s/p CABG; s/p Pacemaker  . Systolic CHF with reduced left ventricular function, NYHA class 2   . Ischemic cardiomyopathy     s/p ICD  . Hypertension   . Hyperlipidemia   . Gout   . Paroxysmal ventricular tachycardia   . Diverticulosis of colon   . AICD (automatic cardioverter/defibrillator) present   . Arthritis     "shoulders" (10/31/2014)  . On home oxygen therapy     "2L prn" (10/31/2014)   Medications:  Prescriptions prior to admission  Medication Sig Dispense Refill Last Dose  . allopurinol (ZYLOPRIM) 300 MG tablet Take 300 mg by mouth daily.     10/30/2014 at Unknown time  . atorvastatin (LIPITOR) 20 MG tablet Take 20 mg by mouth daily.     10/30/2014 at Unknown time  . carvedilol (COREG) 6.25 MG tablet Take 3.125 mg by mouth 2 (two) times daily.    10/30/2014 at 1030  . docusate sodium (COLACE) 100 MG capsule Take 1 capsule (100 mg total) by mouth every 12 (twelve) hours. 30 capsule 0 10/30/2014 at Unknown time  . escitalopram (LEXAPRO) 10 MG tablet Take 10 mg by mouth daily.   0 10/30/2014 at Unknown time  . famotidine (PEPCID)  20 MG tablet Take 20 mg by mouth 2 (two) times daily.     10/30/2014 at Unknown time  . PATADAY 0.2 % SOLN Place 1 drop into both eyes daily.   10/30/2014 at Unknown time  . RA ASPIRIN ADULT LOW STRENGTH 81 MG EC tablet Take 1 tablet by mouth daily.  0 10/30/2014 at Unknown time  . ramipril (ALTACE) 2.5 MG capsule Take 2.5 mg by mouth daily.    10/30/2014 at Unknown time  . Rivaroxaban (XARELTO) 15 MG TABS tablet Take 1 tablet (15 mg total) by mouth daily. (Patient taking differently: Take 15 mg by mouth every evening. ) 30 tablet 3 10/30/2014 at Unknown time  . spironolactone (ALDACTONE) 12.5 mg TABS tablet Take 0.5 tablets (12.5 mg total) by mouth daily. 30 tablet 3 10/30/2014 at Unknown time  . sucralfate (CARAFATE) 1 G tablet Take 1 g by mouth 2 (two) times daily.    10/30/2014 at Unknown time  . NITROSTAT 0.4 MG SL tablet Place 0.4 mg under the tongue every 5 (five) minutes as needed for chest pain (MAX 3 TABLETS).    rescue    Assessment: APTT  137 seconds on IV heparin 750 units/hr.  HL 0.74, effected by previously on rivaroxaban (last taken on 2/25) thus will adjust heparin rate based on aPTT until rivaroxaban effect diminishes.   81yo male who presentecd with vomiting, abdominal pain and a referral by his hospice provider.  He was found to have a partial short bowel obstruction on CT.  He is being followed by GI for possible surgery needs.  At this time, he is deemed a non-surgical candidate.  He was receiving rivaroxaban prior to admit with a dose on 10/30/14. Pharmacy asked to dose IV heparin as bridge therapy.    Goal of Therapy:  Heparin level 0.3-0.7 units/ml aPTT 66-102 seconds Monitor platelets by anticoagulation protocol: Yes   Plan:  Decrease heparin gtt to 600 units/hr -recheck HL and aPTT in 8 hours - Daily CBC/Heparin level while on IV heparin - F/U transition back to oral anticoagulation therapy  Nicole Cella, RPh Clinical Pharmacist Pager: [phone] 11/01/2014,2:14  AM

## 2014-11-02 DIAGNOSIS — E43 Unspecified severe protein-calorie malnutrition: Secondary | ICD-10-CM | POA: Insufficient documentation

## 2014-11-02 LAB — COMPREHENSIVE METABOLIC PANEL
ALBUMIN: 3.1 g/dL — AB (ref 3.5–5.2)
ALK PHOS: 77 U/L (ref 39–117)
ALT: 17 U/L (ref 0–53)
ANION GAP: 7 (ref 5–15)
AST: 25 U/L (ref 0–37)
BUN: 19 mg/dL (ref 6–23)
CO2: 20 mmol/L (ref 19–32)
Calcium: 8.9 mg/dL (ref 8.4–10.5)
Chloride: 109 mmol/L (ref 96–112)
Creatinine, Ser: 1.12 mg/dL (ref 0.50–1.35)
GFR, EST AFRICAN AMERICAN: 67 mL/min — AB (ref >90)
GFR, EST NON AFRICAN AMERICAN: 58 mL/min — AB (ref >90)
GLUCOSE: 135 mg/dL — AB (ref 70–99)
POTASSIUM: 4.4 mmol/L (ref 3.5–5.1)
Sodium: 136 mmol/L (ref 135–145)
Total Bilirubin: 0.7 mg/dL (ref 0.3–1.2)
Total Protein: 5.9 g/dL — ABNORMAL LOW (ref 6.0–8.3)

## 2014-11-02 LAB — GLUCOSE, CAPILLARY
Glucose-Capillary: 96 mg/dL (ref 70–99)
Glucose-Capillary: 95 mg/dL (ref 70–99)
Glucose-Capillary: 202 mg/dL — ABNORMAL HIGH (ref 70–99)

## 2014-11-02 LAB — HEPARIN LEVEL (UNFRACTIONATED): Heparin Unfractionated: 0.34 IU/mL (ref 0.30–0.70)

## 2014-11-02 NOTE — Progress Notes (Signed)
[facility] PROGRESS NOTE  [name] ACZ:[id number] DOB: February 07, 1927 DOA: 10/31/2014 PCP: [name] Hatchet, MD  Assessment/Plan: 81 y.o. male with PMH of CHF,systolic HF (EF 10-93%), DM, CAD s/p CABG, ICD, A flutter (on Xarelto), and history of perforated bowel obstruction approximately 20 years ago presented with with abdominal pain and vomiting and admitted with SOB -patient is under hospice at home   1. Partial small bowel obstruction; CT :Partial small bowel obstruction, transition point in RIGHT upper quadrant associated with small bowel wall thickening, possible adhesions. -repeat KUB: Improving partial small bowel obstruction; no NG, has BS, + flatus; start diet as tolerated;  appreciate surgery input   2. CBD stone; LFTs are unremarkable; CT scan shows 7 mm CBD stone with mild extra hepatic biliary dilation. LFTs normal. Per Discussions with Dr. Paulita Fujita (on call unassigned) who states he would just monitor for now. Patient is clinically improving;  3. Chronic systolic CHF with EF of 20 - 25%; clinically euvolemic; holding ACE, diuretics with acute SBO -close monitor clinically while on IVF, holding lasix, will resume as needed;  4. Afib / Aflutter. History of Complete Heart Block, s/p ICD -Patient normally on Xeralto., started on heparin but developed mild bleeding after NG tube; will hold heparin for now    Code Status: DNR Family Communication: d/w patient, family at the bedside (indicate person spoken with, relationship, and if by phone, the number) Disposition Plan: home pend clinical improvement    Consultants:  Surgery   Procedures:  none  Antibiotics:  none (indicate start date, and stop date if known)  HPI/Subjective: alert  Objective: Filed Vitals:   11/02/14 0533  BP: 86/48  Pulse: 76  Temp: 98.3 F (36.8 C)  Resp: 20    Intake/Output Summary (Last 24 hours) at 11/02/14 1215 Last data filed at 11/02/14 2355  Gross per 24 hour  Intake    2150 ml  Output   1400 ml  Net    750 ml   Filed Weights   10/31/14 0948 11/01/14 0550 11/02/14 0533  Weight: 58.151 kg (128 lb 3.2 oz) 63.866 kg (140 lb 12.8 oz) 65.772 kg (145 lb)    Exam:   General:  alert  Cardiovascular: s1,s2 rrr  Respiratory: few crackles LL  Abdomen: soft, nt,nd   Musculoskeletal: no Leg edema   Data Reviewed: Basic Metabolic Panel:  Recent Labs Lab 10/31/14 0230 11/01/14 0023  NA 132* 135  K 4.4 4.2  CL 98 105  CO2 23 20  GLUCOSE 173* 94  BUN 33* 26*  CREATININE 1.63* 1.22  CALCIUM 9.7 9.0   Liver Function Tests:  Recent Labs Lab 10/31/14 0230  AST 32  ALT 24  ALKPHOS 108  BILITOT 1.0  PROT 7.7  ALBUMIN 4.4    Recent Labs Lab 10/31/14 0230  LIPASE 31   No results for input(s): AMMONIA in the last 168 hours. CBC:  Recent Labs Lab 10/31/14 0230  WBC 6.6  NEUTROABS 5.0  HGB 14.0  HCT 38.8*  MCV 89.0  PLT 131*   Cardiac Enzymes:  Recent Labs Lab 10/31/14 0230  TROPONINI 0.03   BNP (last 3 results) No results for input(s): BNP in the last 8760 hours.  ProBNP (last 3 results)  Recent Labs  01/21/14 1348 01/21/14 2245 07/26/14 1608  PROBNP 3731.0* 3379.0* 2011.0*    CBG:  Recent Labs Lab 11/01/14 1930 11/01/14 2311 11/02/14 0253 11/02/14 0756 11/02/14 1206  GLUCAP 110* 99 96 95 202*    No results  found for this or any previous visit (from the past 240 hour(s)).   Studies: Dg Abd 2 Views  11/01/2014   CLINICAL DATA:  A partial small bowel obstruction  EXAM: ABDOMEN - 2 VIEW  COMPARISON:  10/31/2014  FINDINGS: Contrast material is now seen within the colon consistent with passage from the prior CT examination. This would suggest that the previously seen obstruction is only partial in nature. The degree of small bowel dilatation has improved slightly in the interval from the prior exam. No free air is noted.  IMPRESSION: Improving partial small bowel obstruction. Continued followup is recommended.    Electronically Signed   By: Inez Catalina M.D.   On: 11/01/2014 10:06    Scheduled Meds: . aspirin EC  81 mg Oral Daily  . carvedilol  3.125 mg Oral BID  . escitalopram  10 mg Oral Daily  . famotidine  20 mg Oral BID  . insulin aspart  0-9 Units Subcutaneous 6 times per day  . olopatadine  1 drop Both Eyes BID   Continuous Infusions: . sodium chloride 50 mL/hr at 11/01/14 2218    Principal Problem:   Partial small bowel obstruction Active Problems:   Chronic systolic heart failure   Automatic implantable cardioverter-defibrillator in situ   Atrial flutter   Diabetes mellitus   Acute renal failure   Do not resuscitate   Small bowel obstruction due to postoperative adhesions   Acute kidney injury   Cardiomyopathy, ischemic   Protein-calorie malnutrition, severe    Time spent: >35 minutes     Kinnie Feil  [facility] Pager [phone]. If 7PM-7AM, please contact night-coverage at [email or web address], password [facility] 11/02/2014, 12:15 PM  LOS: 2 days

## 2014-11-02 NOTE — Progress Notes (Addendum)
ANTICOAGULATION CONSULT NOTE - Follow up Consult  Heparin held at 08:00 AM 2/28 due to some hemoptysis - Heparin level within therapeutic range at 0.34 on 600 units/hr prior to holding.  Goal is 0.3-0.7.  Will f/u plans to continue when physician rounds.  Pharmacy Consult for Heparin Indication: Bridge while off Rivaroxaban  No Known Allergies  Patient Measurements: Height: 5\' 4"  (162.6 cm) Weight: 145 lb (65.772 kg) IBW/kg (Calculated) : 59.2  Vital Signs: Temp: 98.3 F (36.8 C) (02/28 0533) Temp Source: Oral (02/28 0533) BP: 86/48 mmHg (02/28 0533) Pulse Rate: 76 (02/28 0533)  Labs:  Recent Labs  10/31/14 0230  10/31/14 1445 11/01/14 0023 11/01/14 0928 11/02/14 0634  HGB 14.0  --   --   --   --   --   HCT 38.8*  --   --   --   --   --   PLT 131*  --   --   --   --   --   APTT  --   --  54* 137* 109*  --   HEPARINUNFRC  --   < >  --  0.74* 0.61 0.34  CREATININE 1.63*  --   --  1.22  --   --   TROPONINI 0.03  --   --   --   --   --   < > = values in this interval not displayed. Estimated Creatinine Clearance: 37.1 mL/min (by C-G formula based on Cr of 1.22).  Medical History: Past Medical History  Diagnosis Date  . CAD (coronary artery disease)     s/p CABG; s/p Pacemaker  . Systolic CHF with reduced left ventricular function, NYHA class 2   . Ischemic cardiomyopathy     s/p ICD  . Hypertension   . Hyperlipidemia   . Gout   . Paroxysmal ventricular tachycardia   . Diverticulosis of colon   . AICD (automatic cardioverter/defibrillator) present   . Arthritis     "shoulders" (10/31/2014)  . On home oxygen therapy     "2L prn" (10/31/2014)   Medications:  Prescriptions prior to admission  Medication Sig Dispense Refill Last Dose  . allopurinol (ZYLOPRIM) 300 MG tablet Take 300 mg by mouth daily.     10/30/2014 at Unknown time  . atorvastatin (LIPITOR) 20 MG tablet Take 20 mg by mouth daily.     10/30/2014 at Unknown time  . carvedilol (COREG) 6.25 MG tablet  Take 3.125 mg by mouth 2 (two) times daily.    10/30/2014 at 1030  . docusate sodium (COLACE) 100 MG capsule Take 1 capsule (100 mg total) by mouth every 12 (twelve) hours. 30 capsule 0 10/30/2014 at Unknown time  . escitalopram (LEXAPRO) 10 MG tablet Take 10 mg by mouth daily.   0 10/30/2014 at Unknown time  . famotidine (PEPCID) 20 MG tablet Take 20 mg by mouth 2 (two) times daily.     10/30/2014 at Unknown time  . PATADAY 0.2 % SOLN Place 1 drop into both eyes daily.   10/30/2014 at Unknown time  . RA ASPIRIN ADULT LOW STRENGTH 81 MG EC tablet Take 1 tablet by mouth daily.  0 10/30/2014 at Unknown time  . ramipril (ALTACE) 2.5 MG capsule Take 2.5 mg by mouth daily.    10/30/2014 at Unknown time  . Rivaroxaban (XARELTO) 15 MG TABS tablet Take 1 tablet (15 mg total) by mouth daily. (Patient taking differently: Take 15 mg by mouth every evening. ) 30 tablet 3  10/30/2014 at Unknown time  . spironolactone (ALDACTONE) 12.5 mg TABS tablet Take 0.5 tablets (12.5 mg total) by mouth daily. 30 tablet 3 10/30/2014 at Unknown time  . sucralfate (CARAFATE) 1 G tablet Take 1 g by mouth 2 (two) times daily.    10/30/2014 at Unknown time  . NITROSTAT 0.4 MG SL tablet Place 0.4 mg under the tongue every 5 (five) minutes as needed for chest pain (MAX 3 TABLETS).    rescue   Assessment:  81yo male who presentecd with vomiting, abdominal pain and a referral by his hospice provider.  He was found to have a partial short bowel obstruction on CT.  He is being followed by GI for possible surgery needs.  At this time, he is deemed a non-surgical candidate.  He was receiving rivaroxaban prior to admit with a dose on 10/30/14.    Goal of Therapy:  Heparin level 0.3-0.7 units/ml aPTT 66-102 seconds Monitor platelets by anticoagulation protocol: Yes   Plan:  - Heparin on hold - f/u plans to restart.  - Daily CBC/Heparin level while on IV heparin - F/U transition back to oral anticoagulation therapy  Rober Minion, PharmD.,  MS Clinical Pharmacist Pager:  [phone] Thank you for allowing pharmacy to be part of this patients care team. 11/02/2014,9:31 AM

## 2014-11-02 NOTE — Progress Notes (Signed)
Pt coughing up some bloody sputum perhaps from the trauma of when they were trying to insert NG tube.  Pharmacy notified and Dr. Daleen Bo notified, ordered to hold heparin for now, stopped.

## 2014-11-02 NOTE — Progress Notes (Signed)
  Subjective: Doing well.  Tol Po.  + flatus  Objective: Vital signs in last 24 hours: Temp:  [98.2 F (36.8 C)-98.5 F (36.9 C)] 98.3 F (36.8 C) (02/28 0533) Pulse Rate:  [62-76] 76 (02/28 0533) Resp:  [18-20] 20 (02/28 0533) BP: (86-108)/(48-55) 86/48 mmHg (02/28 0533) SpO2:  [90 %-98 %] 90 % (02/28 0533) Weight:  [145 lb (65.772 kg)] 145 lb (65.772 kg) (02/28 0533) Last BM Date: 10/31/14  Intake/Output from previous day: 02/27 0701 - 02/28 0700 In: 2150 [P.O.:800; I.V.:1350] Out: 1200 [Urine:1200] Intake/Output this shift:    General appearance: alert and cooperative GI: soft, non-tender; bowel sounds normal; no masses,  no organomegaly  Lab Results:   Recent Labs  10/31/14 0230  WBC 6.6  HGB 14.0  HCT 38.8*  PLT 131*   BMET  Recent Labs  10/31/14 0230 11/01/14 0023  NA 132* 135  K 4.4 4.2  CL 98 105  CO2 23 20  GLUCOSE 173* 94  BUN 33* 26*  CREATININE 1.63* 1.22  CALCIUM 9.7 9.0   PT/INR No results for input(s): LABPROT, INR in the last 72 hours. ABG No results for input(s): PHART, HCO3 in the last 72 hours.  Invalid input(s): PCO2, PO2  Studies/Results: Dg Chest 2 View  10/31/2014   CLINICAL DATA:  Difficulty breathing  EXAM: CHEST  2 VIEW  COMPARISON:  July 26, 2014  FINDINGS: There is mild scarring in the bases. There is underlying emphysematous change. There is no edema or consolidation. Heart is borderline prominent with pulmonary vascularity within normal limits. Patient is status post coronary artery bypass grafting. Pacemaker leads are attached to the right atrium, right ventricle, and left ventricle. No bone lesions. No adenopathy. Aorta is somewhat tortuous with atherosclerotic change in the aorta.  IMPRESSION: Underlying emphysema with mild bibasilar scarring. No edema or consolidation. Heart prominent but stable.   Electronically Signed   By: Lowella Grip III M.D.   On: 10/31/2014 08:38   Dg Abd 2 Views  11/01/2014   CLINICAL  DATA:  A partial small bowel obstruction  EXAM: ABDOMEN - 2 VIEW  COMPARISON:  10/31/2014  FINDINGS: Contrast material is now seen within the colon consistent with passage from the prior CT examination. This would suggest that the previously seen obstruction is only partial in nature. The degree of small bowel dilatation has improved slightly in the interval from the prior exam. No free air is noted.  IMPRESSION: Improving partial small bowel obstruction. Continued followup is recommended.   Electronically Signed   By: Inez Catalina M.D.   On: 11/01/2014 10:06    Anti-infectives: Anti-infectives    None      Assessment/Plan:  Principal Problem:   Partial small bowel obstruction Active Problems:   Chronic systolic heart failure   Automatic implantable cardioverter-defibrillator in situ   Atrial flutter   Diabetes mellitus   Acute renal failure   Do not resuscitate   Small bowel obstruction due to postoperative adhesions   Acute kidney injury   Cardiomyopathy, ischemic  Advance diet to FLD and ADAT mobilize  LOS: 2 days    Rosario Jacks., [facility] 11/02/2014

## 2014-11-03 DIAGNOSIS — E43 Unspecified severe protein-calorie malnutrition: Secondary | ICD-10-CM

## 2014-11-03 MED ORDER — SPIRONOLACTONE 25 MG PO TABS
12.5000 mg | ORAL_TABLET | Freq: Every day | ORAL | Status: DC
  Administered 2014-11-03 – 2014-11-04 (×2): 12.5 mg via ORAL
  Filled 2014-11-03 (×2): qty 1

## 2014-11-03 NOTE — Progress Notes (Signed)
Chaplain responded to spiritual care consult for pt requesting prayer. Now was not a good time for visit. Chaplain informed pt to have nurse page chaplain when it is a better time. Page chaplain as needed. [phone].   11/03/14 1300  Clinical Encounter Type  Visited With Patient  Visit Type Initial;Spiritual support  Referral From Nurse  Spiritual Encounters  Spiritual Needs Emotional;Prayer  Larey Dresser Epifanio Lesches 11/03/2014 1:41 PM

## 2014-11-03 NOTE — Progress Notes (Signed)
[facility] PROGRESS NOTE  [name] TKW:[id number] DOB: 08-10-1927 DOA: 10/31/2014 PCP: [name], MD  Assessment/Plan: 81 y.o. male with PMH of CHF,systolic HF (EF 92-42%), DM, CAD s/p CABG, ICD, A flutter (on Xarelto), and history of perforated bowel obstruction approximately 20 years ago presented with with abdominal pain and vomiting and admitted with SOB -patient is under hospice at home   1. Partial small bowel obstruction; CT :Partial small bowel obstruction, transition point in RIGHT upper quadrant associated with small bowel wall thickening, possible adhesions. -repeat KUB: Improving partial small bowel obstruction; no NG, has BS, + flatus; start diet as tolerated;  appreciate surgery input   2. CBD stone; LFTs are unremarkable; CT scan shows 7 mm CBD stone with mild extra hepatic biliary dilation. LFTs normal. Per Discussions with Dr. Paulita Fujita (on call unassigned) who states he would just monitor for now. Patient is clinically improving;  3. Chronic systolic CHF with EF of 20 - 25%; clinically euvolemic; d/c IVF, resume aldactone today;   4. Afib / Aflutter. History of Complete Heart Block, s/p ICD; Patient normally on Xeralto., started on heparin but developed mild bleeding after NG tube; will hold heparin for now   Will monitor overnight, if tolerating diet; d/c home on 3/1  Code Status: DNR Family Communication: d/w patient, family at the bedside (indicate person spoken with, relationship, and if by phone, the number) Disposition Plan: home pend clinical improvement    Consultants:  Surgery   Procedures:  none  Antibiotics:  none (indicate start date, and stop date if known)  HPI/Subjective: alert  Objective: Filed Vitals:   11/03/14 0603  BP: 113/52  Pulse: 60  Temp: 98.1 F (36.7 C)  Resp: 18    Intake/Output Summary (Last 24 hours) at 11/03/14 1050 Last data filed at 11/03/14 0605  Gross per 24 hour  Intake 1504.17 ml  Output     650 ml  Net 854.17 ml   Filed Weights   11/01/14 0550 11/02/14 0533 11/03/14 0603  Weight: 63.866 kg (140 lb 12.8 oz) 65.772 kg (145 lb) 66.316 kg (146 lb 3.2 oz)    Exam:   General:  alert  Cardiovascular: s1,s2 rrr  Respiratory: few crackles LL  Abdomen: soft, nt,nd   Musculoskeletal: no Leg edema   Data Reviewed: Basic Metabolic Panel:  Recent Labs Lab 10/31/14 0230 11/01/14 0023 11/02/14 1029  NA 132* 135 136  K 4.4 4.2 4.4  CL 98 105 109  CO2 23 20 20   GLUCOSE 173* 94 135*  BUN 33* 26* 19  CREATININE 1.63* 1.22 1.12  CALCIUM 9.7 9.0 8.9   Liver Function Tests:  Recent Labs Lab 10/31/14 0230 11/02/14 1029  AST 32 25  ALT 24 17  ALKPHOS 108 77  BILITOT 1.0 0.7  PROT 7.7 5.9*  ALBUMIN 4.4 3.1*    Recent Labs Lab 10/31/14 0230  LIPASE 31   No results for input(s): AMMONIA in the last 168 hours. CBC:  Recent Labs Lab 10/31/14 0230  WBC 6.6  NEUTROABS 5.0  HGB 14.0  HCT 38.8*  MCV 89.0  PLT 131*   Cardiac Enzymes:  Recent Labs Lab 10/31/14 0230  TROPONINI 0.03   BNP (last 3 results) No results for input(s): BNP in the last 8760 hours.  ProBNP (last 3 results)  Recent Labs  01/21/14 1348 01/21/14 2245 07/26/14 1608  PROBNP 3731.0* 3379.0* 2011.0*    CBG:  Recent Labs Lab 11/01/14 1930 11/01/14 2311 11/02/14 0253 11/02/14 0756 11/02/14 1206  GLUCAP 110* 99 96 95 202*    No results found for this or any previous visit (from the past 240 hour(s)).   Studies: No results found.  Scheduled Meds: . aspirin EC  81 mg Oral Daily  . carvedilol  3.125 mg Oral BID  . escitalopram  10 mg Oral Daily  . famotidine  20 mg Oral BID  . olopatadine  1 drop Both Eyes BID   Continuous Infusions: . sodium chloride 50 mL/hr at 11/02/14 1729    Principal Problem:   Partial small bowel obstruction Active Problems:   Chronic systolic heart failure   Automatic implantable cardioverter-defibrillator in situ   Atrial  flutter   Diabetes mellitus   Acute renal failure   Do not resuscitate   Small bowel obstruction due to postoperative adhesions   Acute kidney injury   Cardiomyopathy, ischemic   Protein-calorie malnutrition, severe    Time spent: >35 minutes     Kinnie Feil  [facility] Pager [phone]. If 7PM-7AM, please contact night-coverage at [email or web address], password [email or web address] 11/03/2014, 10:50 AM  LOS: 3 days

## 2014-11-03 NOTE — Progress Notes (Signed)
Patient ID: [name], male   DOB: [birth year], 81 y.o.   MRN: [id number]     [address] [place], [place] [address]    Phone: [phone] FAX: [phone]     Subjective: No pain.  Having BMs.  Tolerating fulls.   Objective:  Vital signs:  Filed Vitals:   11/02/14 0533 11/02/14 1340 11/02/14 2120 11/03/14 0603  BP: 86/48 102/52 102/53 113/52  Pulse: 76 76 62 60  Temp: 98.3 F (36.8 C) 98.4 F (36.9 C) 98.4 F (36.9 C) 98.1 F (36.7 C)  TempSrc: Oral Oral Oral Oral  Resp: $Remo'20 18 19 18  'kaoVK$ Height:      Weight: 145 lb (65.772 kg)   146 lb 3.2 oz (66.316 kg)  SpO2: 90% 96% 99% 99%    Last BM Date: 11/02/14  Intake/Output   Yesterday:  02/28 0701 - 02/29 0700 In: 1984.2 [P.O.:1460; I.V.:524.2] Out: 850 [Urine:850] This shift:    I/O last 3 completed shifts: In: 2734.2 [P.O.:1460; I.V.:1274.2] Out: 1450 [Urine:1450]    Physical Exam: General: Pt awake/alert/oriented x4 in no acute distress Abdomen: Soft.  Nondistended.  Non tender.  Reducible incisional hernia.  No evidence of peritonitis.  No incarcerated hernias.   Problem List:   Principal Problem:   Partial small bowel obstruction Active Problems:   Chronic systolic heart failure   Automatic implantable cardioverter-defibrillator in situ   Atrial flutter   Diabetes mellitus   Acute renal failure   Do not resuscitate   Small bowel obstruction due to postoperative adhesions   Acute kidney injury   Cardiomyopathy, ischemic   Protein-calorie malnutrition, severe    Results:   Labs: Results for orders placed or performed during the hospital encounter of 10/31/14 (from the past 9 hour(s))  APTT     Status: Abnormal   Collection Time: 11/01/14  9:28 AM  Result Value Ref Range   aPTT 109 (H) 24 - 37 seconds    Comment:        IF BASELINE aPTT IS ELEVATED, SUGGEST PATIENT RISK ASSESSMENT BE USED TO DETERMINE  APPROPRIATE ANTICOAGULANT THERAPY.   Heparin level (unfractionated)     Status: None   Collection Time: 11/01/14  9:28 AM  Result Value Ref Range   Heparin Unfractionated 0.61 0.30 - 0.70 IU/mL    Comment:        IF HEPARIN RESULTS ARE BELOW EXPECTED VALUES, AND PATIENT DOSAGE HAS BEEN CONFIRMED, SUGGEST FOLLOW UP TESTING OF ANTITHROMBIN III LEVELS.   Glucose, capillary     Status: None   Collection Time: 11/01/14 11:44 AM  Result Value Ref Range   Glucose-Capillary 99 70 - 99 mg/dL  Glucose, capillary     Status: Abnormal   Collection Time: 11/01/14  4:14 PM  Result Value Ref Range   Glucose-Capillary 215 (H) 70 - 99 mg/dL  Glucose, capillary     Status: Abnormal   Collection Time: 11/01/14  7:30 PM  Result Value Ref Range   Glucose-Capillary 110 (H) 70 - 99 mg/dL   Comment 1 Notify RN   Glucose, capillary     Status: None   Collection Time: 11/01/14 11:11 PM  Result Value Ref Range   Glucose-Capillary 99 70 - 99 mg/dL  Glucose, capillary     Status: None   Collection Time: 11/02/14  2:53 AM  Result Value Ref Range   Glucose-Capillary 96 70 - 99 mg/dL  Heparin level (  unfractionated)     Status: None   Collection Time: 11/02/14  6:34 AM  Result Value Ref Range   Heparin Unfractionated 0.34 0.30 - 0.70 IU/mL    Comment:        IF HEPARIN RESULTS ARE BELOW EXPECTED VALUES, AND PATIENT DOSAGE HAS BEEN CONFIRMED, SUGGEST FOLLOW UP TESTING OF ANTITHROMBIN III LEVELS.   Glucose, capillary     Status: None   Collection Time: 11/02/14  7:56 AM  Result Value Ref Range   Glucose-Capillary 95 70 - 99 mg/dL  Comprehensive metabolic panel     Status: Abnormal   Collection Time: 11/02/14 10:29 AM  Result Value Ref Range   Sodium 136 135 - 145 mmol/L   Potassium 4.4 3.5 - 5.1 mmol/L   Chloride 109 96 - 112 mmol/L   CO2 20 19 - 32 mmol/L   Glucose, Bld 135 (H) 70 - 99 mg/dL   BUN 19 6 - 23 mg/dL   Creatinine, Ser 7.28 0.50 - 1.35 mg/dL   Calcium 8.9 8.4 - 22.3 mg/dL    Total Protein 5.9 (L) 6.0 - 8.3 g/dL   Albumin 3.1 (L) 3.5 - 5.2 g/dL   AST 25 0 - 37 U/L   ALT 17 0 - 53 U/L   Alkaline Phosphatase 77 39 - 117 U/L   Total Bilirubin 0.7 0.3 - 1.2 mg/dL   GFR calc non Af Amer 58 (L) >90 mL/min   GFR calc Af Amer 67 (L) >90 mL/min    Comment: (NOTE) The eGFR has been calculated using the CKD EPI equation. This calculation has not been validated in all clinical situations. eGFR's persistently <90 mL/min signify possible Chronic Kidney Disease.    Anion gap 7 5 - 15  Glucose, capillary     Status: Abnormal   Collection Time: 11/02/14 12:06 PM  Result Value Ref Range   Glucose-Capillary 202 (H) 70 - 99 mg/dL    Imaging / Studies: Dg Abd 2 Views  11/01/2014   CLINICAL DATA:  A partial small bowel obstruction  EXAM: ABDOMEN - 2 VIEW  COMPARISON:  10/31/2014  FINDINGS: Contrast material is now seen within the colon consistent with passage from the prior CT examination. This would suggest that the previously seen obstruction is only partial in nature. The degree of small bowel dilatation has improved slightly in the interval from the prior exam. No free air is noted.  IMPRESSION: Improving partial small bowel obstruction. Continued followup is recommended.   Electronically Signed   By: Alcide Clever M.D.   On: 11/01/2014 10:06    Medications / Allergies:  Scheduled Meds: . aspirin EC  81 mg Oral Daily  . carvedilol  3.125 mg Oral BID  . escitalopram  10 mg Oral Daily  . famotidine  20 mg Oral BID  . olopatadine  1 drop Both Eyes BID   Continuous Infusions: . sodium chloride 50 mL/hr at 11/02/14 1729   PRN Meds:.acetaminophen **OR** acetaminophen, morphine injection, nitroGLYCERIN, ondansetron **OR** ondansetron (ZOFRAN) IV, phenol  Antibiotics: Anti-infectives    None        Assessment/Plan Incisional hernia's, reducible -non operative candidate given CHF with EF 20-25%, CAD, ICM s/p ICD, MI PCI in 2015, AF/Flutter on Xarelto PBSO  HD#3 -resolved, advance to soft diet -stable for discharge from surgical standpoint -surgery signing off, please call with questions or concerns.   Ashok Norris, [facility] Pager [phone]) For consults and floor pages call [phone](7A-4:30P)  11/03/2014 9:15 AM

## 2014-11-03 NOTE — Progress Notes (Signed)
Physical Therapy Treatment Patient Details Name: [name] MRN: [id number] DOB: December 25, 1926 Today's Date: 11/03/2014    History of Present Illness Patient is an 81 yo male with severe abdominal pain that started 2/25. He had one episode of vomiting and determined that he has SBO.     PT Comments    Patient progressing well with mobility. Improved ambulation distance today. Balance much improved with use of RW vs SPC. Pt aware of improved balance and willing to use RW at home for safe ambulation. Encouraged daily ambulation with RN tech to maintain mobility and improve endurance. Will continue to follow acutely to improve higher level balance with use of RW.    Follow Up Recommendations  Home health PT;Supervision - Intermittent     Equipment Recommendations  None recommended by PT    Recommendations for Other Services       Precautions / Restrictions Precautions Precautions: Fall Restrictions Weight Bearing Restrictions: No    Mobility  Bed Mobility               General bed mobility comments: Received sitting in chair upon PT arrival.   Transfers Overall transfer level: Needs assistance Equipment used: Rolling walker (2 wheeled) Transfers: Sit to/from Stand Sit to Stand: Modified independent (Device/Increase time)         General transfer comment: Stood from chair x1.   Ambulation/Gait Ambulation/Gait assistance: Supervision Ambulation Distance (Feet): 500 Feet Assistive device: Rolling walker (2 wheeled) Gait Pattern/deviations: Step-through pattern;Trunk flexed     General Gait Details: Steady gait pattern with use of RW. Dyspnea present. Vitals stable.    Stairs            Wheelchair Mobility    Modified Rankin (Stroke Patients Only)       Balance Overall balance assessment: Needs assistance Sitting-balance support: Feet supported;No upper extremity supported Sitting balance-Leahy Scale: Normal     Standing balance support:  During functional activity Standing balance-Leahy Scale: Fair Standing balance comment: Able to perform dynamic standing activities at sink- washing hands, face and reaching outside BoS without LOB. tolerated urination in standing wtihout difficulty.                    Cognition Arousal/Alertness: Awake/alert Behavior During Therapy: WFL for tasks assessed/performed Overall Cognitive Status: Within Functional Limits for tasks assessed                      Exercises      General Comments        Pertinent Vitals/Pain Pain Assessment: No/denies pain    Home Living                      Prior Function            PT Goals (current goals can now be found in the care plan section) Progress towards PT goals: Progressing toward goals    Frequency       PT Plan Current plan remains appropriate    Co-evaluation             End of Session Equipment Utilized During Treatment: Gait belt Activity Tolerance: Patient tolerated treatment well Patient left: in chair;with call bell/phone within reach     Time: 1417-1433 PT Time Calculation (min) (ACUTE ONLY): 16 min  Charges:  $Gait Training: 8-22 mins                    G Codes:  Candy Sledge A 11/03/2014, 2:36 PM Candy Sledge, [place], DPT [phone]

## 2014-11-04 NOTE — Care Management (Addendum)
Spoke to [employer] at [facility] phone [phone] fax [phone]  , she is aware patient discharging today . Discharge summary faxed .   Magdalen Spatz RN BSN [phone]

## 2014-11-04 NOTE — Discharge Summary (Addendum)
Physician Discharge Summary  [name] CWC:[id number] DOB: Oct 31, 1926 DOA: 10/31/2014  PCP: [name], MD  Admit date: 10/31/2014 Discharge date: 11/04/2014  Time spent: >35 minutes   Recommendations for Outpatient Follow-up:  F/u with PCP in 1 week  -resume hospice care   Discharge Diagnoses:  Principal Problem:   Partial small bowel obstruction Active Problems:   Chronic systolic heart failure   Automatic implantable cardioverter-defibrillator in situ   Atrial flutter   Diabetes mellitus   Acute renal failure   Do not resuscitate   Small bowel obstruction due to postoperative adhesions   Acute kidney injury   Cardiomyopathy, ischemic   Protein-calorie malnutrition, severe   Discharge Condition: stable   Diet recommendation: low sodium   Filed Weights   11/02/14 0533 11/03/14 0603 11/04/14 0537  Weight: 65.772 kg (145 lb) 66.316 kg (146 lb 3.2 oz) 65.363 kg (144 lb 1.6 oz)    History of present illness:  81 y.o. male with PMH of CHF,systolic HF (EF 76-16%), DM, CAD s/p CABG, ICD, A flutter (on Xarelto), and history of perforated bowel obstruction approximately 20 years ago presented with with abdominal pain and vomiting and admitted with SOB -patient is under hospice at home    Hospital Course:  1. Partial small bowel obstruction; CT :Partial small bowel obstruction, transition point in RIGHT upper quadrant associated with small bowel wall thickening, possible adhesions.  -patient clinically improved on supportive care with gentle IVF, antiemetics, NGT; repeat KUB: Improving partial small bowel obstruction; has BS, + flatus; SBO completely resolved, patient tolerated diet well;   2. CBD stone; LFTs are unremarkable; CT scan shows 7 mm CBD stone with mild extra hepatic biliary dilation. LFTs normal. Per Discussions with Dr. Paulita Fujita (on call unassigned) who states he would just monitor for now. Patient has clinically improved, currently asymptomatic; cont  outpatient follow up. But patient is unlikely a good candidate for surgery or procedure with advanced HF  3. Chronic systolic CHF with EF of 20 - 25%; clinically euvolemic; resume home regimen, but we will hold ACE due to AKI  4. Afib / Aflutter. History of Complete Heart Block, s/p ICD; resume home regimen    Procedures:  none (i.e. Studies not automatically included, echos, thoracentesis, etc; not x-rays)  Consultations:  Surgery   Discharge Exam: Filed Vitals:   11/04/14 0537  BP: 132/60  Pulse: 52  Temp: 97.8 F (36.6 C)  Resp: 18    General: alert Cardiovascular: s1,s2 rrr Respiratory: CTA BL  Discharge Instructions  Discharge Instructions    Diet - low sodium heart healthy    Complete by:  As directed      Discharge instructions    Complete by:  As directed   Please follow up with primary care doctor in 1 week     Increase activity slowly    Complete by:  As directed             Medication List    STOP taking these medications        ramipril 2.5 MG capsule  Commonly known as:  ALTACE      TAKE these medications        allopurinol 300 MG tablet  Commonly known as:  ZYLOPRIM  Take 300 mg by mouth daily.     atorvastatin 20 MG tablet  Commonly known as:  LIPITOR  Take 20 mg by mouth daily.     carvedilol 6.25 MG tablet  Commonly known as:  COREG  Take 3.125 mg by mouth 2 (two) times daily.     docusate sodium 100 MG capsule  Commonly known as:  COLACE  Take 1 capsule (100 mg total) by mouth every 12 (twelve) hours.     escitalopram 10 MG tablet  Commonly known as:  LEXAPRO  Take 10 mg by mouth daily.     famotidine 20 MG tablet  Commonly known as:  PEPCID  Take 20 mg by mouth 2 (two) times daily.     NITROSTAT 0.4 MG SL tablet  Generic drug:  nitroGLYCERIN  Place 0.4 mg under the tongue every 5 (five) minutes as needed for chest pain (MAX 3 TABLETS).     PATADAY 0.2 % Soln  Generic drug:  Olopatadine HCl  Place 1 drop into both  eyes daily.     RA ASPIRIN ADULT LOW STRENGTH 81 MG EC tablet  Generic drug:  aspirin  Take 1 tablet by mouth daily.     Rivaroxaban 15 MG Tabs tablet  Commonly known as:  XARELTO  Take 1 tablet (15 mg total) by mouth daily.     spironolactone 12.5 mg Tabs tablet  Commonly known as:  ALDACTONE  Take 0.5 tablets (12.5 mg total) by mouth daily.     sucralfate 1 G tablet  Commonly known as:  CARAFATE  Take 1 g by mouth 2 (two) times daily.       No Known Allergies     Follow-up Information    Follow up with [name], MD In 1 week.   Specialty:  Internal Medicine   Contact information:   [place] [id number] [phone]        The results of significant diagnostics from this hospitalization (including imaging, microbiology, ancillary and laboratory) are listed below for reference.    Significant Diagnostic Studies: Dg Chest 2 View  10/31/2014   CLINICAL DATA:  Difficulty breathing  EXAM: CHEST  2 VIEW  COMPARISON:  July 26, 2014  FINDINGS: There is mild scarring in the bases. There is underlying emphysematous change. There is no edema or consolidation. Heart is borderline prominent with pulmonary vascularity within normal limits. Patient is status post coronary artery bypass grafting. Pacemaker leads are attached to the right atrium, right ventricle, and left ventricle. No bone lesions. No adenopathy. Aorta is somewhat tortuous with atherosclerotic change in the aorta.  IMPRESSION: Underlying emphysema with mild bibasilar scarring. No edema or consolidation. Heart prominent but stable.   Electronically Signed   By: Lowella Grip III M.D.   On: 10/31/2014 08:38   Dg Thoracic Spine 2 View  10/10/2014   CLINICAL DATA:  Linear bone scan activity in thoracic spine. History of prostate cancer. No complaints of pain or injury.  EXAM: THORACIC SPINE - 2 VIEW  COMPARISON:  None.  FINDINGS: There is no evidence of thoracic spine fracture. Alignment is  normal. No other significant bone abnormalities are identified. There is degenerative disc disease throughout the thoracic spine.  There is evidence of prior CABG.  There is a cardiac pacer noted.  IMPRESSION: 1. Degenerative disc disease throughout the thoracic spine. No acute osseous injury of the thoracic spine.   Electronically Signed   By: Kathreen Devoid   On: 10/10/2014 09:19   Ct Abdomen Pelvis W Contrast  10/31/2014   CLINICAL DATA:  Lower abdominal pain, nausea beginning at 5 p.m. yesterday. Shortness of breath. Recent weight loss. History of prostate cancer.  EXAM: CT ABDOMEN AND PELVIS WITH CONTRAST  TECHNIQUE: Multidetector CT imaging of the abdomen and pelvis was performed using the standard protocol following bolus administration of intravenous contrast.  CONTRAST:  105mL OMNIPAQUE IOHEXOL 300 MG/ML  SOLN  COMPARISON:  Bone scan September 16, 2014  FINDINGS: LUNG BASES: At least moderate cardiomegaly, with cardiac pacer wires in place, no pericardial lesions. The lungs are clear. Status post median sternotomy.  SOLID ORGANS: The liver, spleen, pancreas and adrenal glands are unremarkable. Common bile duct is enlarged, 12 mm with 7 mm distal Common bile duct stone, coronal 54/112.  GASTROINTESTINAL TRACT: Mildly dilated proximal small bowel with small bowel feces measuring up to 2.8 cm, transition point RIGHT lower quadrant associated with circumferential wall thickening. The stomach, and large bowel are normal in course and caliber without inflammatory changes. Extensive colonic diverticulosis. Enteric contrast has not yet reached the distal small bowel.  KIDNEYS/ URINARY TRACT: Kidneys are orthotopic, demonstrating symmetric enhancement on early phase, slightly delayed nephrogram on the LEFT. No nephrolithiasis, hydronephrosis or solid renal masses. Bilateral low-density renal cysts measure up to 3.2 cm in upper pole RIGHT kidney. The unopacified ureters are normal in course and caliber. Delayed imaging  through the kidneys demonstrates symmetric prompt contrast excretion within the proximal urinary collecting system. Urinary bladder is well distended, with mild wall thickening.  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 No lymphadenopathy by CT size criteria. Prostate is enlarged, 5.7 x 5.4 cm, invading the base of the urinary bladder. Small to moderate free fluid in the RIGHT upper quadrant. No intraperitoneal free air.  SOFT TISSUE/OSSEOUS STRUCTURES: Rectus abdominus diastases, fatty atrophy with apposition of the small bowel to the of anterior abdominal wall associated with scarring. Grade 1 L4-5 anterolisthesis on degenerative basis.  IMPRESSION: Partial small bowel obstruction, transition point in RIGHT upper quadrant associated with small bowel wall thickening, possible adhesions.  7 mm distal Common bile duct stone with mild extra hepatic biliary dilatation.  Slightly delayed nephrogram on the LEFT concerning for Mild dysfunction, if clinically indicated, renal scintigraphy could be performed. No obstructive uropathy.  Prostatomegaly.   Electronically Signed   By: Elon Alas   On: 10/31/2014 05:55   Dg Abd 2 Views  11/01/2014   CLINICAL DATA:  A partial small bowel obstruction  EXAM: ABDOMEN - 2 VIEW  COMPARISON:  10/31/2014  FINDINGS: Contrast material is now seen within the colon consistent with passage from the prior CT examination. This would suggest that the previously seen obstruction is only partial in nature. The degree of small bowel dilatation has improved slightly in the interval from the prior exam. No free air is noted.  IMPRESSION: Improving partial small bowel obstruction. Continued followup is recommended.   Electronically Signed   By: Inez Catalina M.D.   On: 11/01/2014 10:06    Microbiology: No results found for this or any previous visit (from the  past 240 hour(s)).   Labs: Basic Metabolic Panel:  Recent Labs Lab 10/31/14 0230 11/01/14 0023 11/02/14 1029  NA 132* 135 136  K 4.4 4.2 4.4  CL 98 105 109  CO2 23 20 20   GLUCOSE 173* 94 135*  BUN 33* 26* 19  CREATININE 1.63* 1.22 1.12  CALCIUM 9.7 9.0 8.9   Liver Function Tests:  Recent Labs Lab 10/31/14 0230 11/02/14 1029  AST 32 25  ALT 24 17  ALKPHOS 108 77  BILITOT 1.0 0.7  PROT 7.7 5.9*  ALBUMIN 4.4 3.1*  Recent Labs Lab 10/31/14 0230  LIPASE 31   No results for input(s): AMMONIA in the last 168 hours. CBC:  Recent Labs Lab 10/31/14 0230  WBC 6.6  NEUTROABS 5.0  HGB 14.0  HCT 38.8*  MCV 89.0  PLT 131*   Cardiac Enzymes:  Recent Labs Lab 10/31/14 0230  TROPONINI 0.03   BNP: BNP (last 3 results) No results for input(s): BNP in the last 8760 hours.  ProBNP (last 3 results)  Recent Labs  01/21/14 1348 01/21/14 2245 07/26/14 1608  PROBNP 3731.0* 3379.0* 2011.0*    CBG:  Recent Labs Lab 11/01/14 1930 11/01/14 2311 11/02/14 0253 11/02/14 0756 11/02/14 1206  GLUCAP 110* 99 96 95 202*       Signed:  Petrona Wyeth N  [facility] 11/04/2014, 9:41 AM

## 2014-11-04 NOTE — Care Management (Signed)
An Important Message From Medicare About Your Rights , given to patient . Iman Orourke RN BSN              

## 2014-11-04 NOTE — Progress Notes (Signed)
Discharge home. Home discharge instruction given, no question verbalized. 

## 2014-11-11 DIAGNOSIS — H2511 Age-related nuclear cataract, right eye: Secondary | ICD-10-CM | POA: Diagnosis not present

## 2014-11-13 DIAGNOSIS — I509 Heart failure, unspecified: Secondary | ICD-10-CM | POA: Diagnosis not present

## 2014-11-13 DIAGNOSIS — Z6824 Body mass index (BMI) 24.0-24.9, adult: Secondary | ICD-10-CM | POA: Diagnosis not present

## 2014-11-13 DIAGNOSIS — K5669 Other intestinal obstruction: Secondary | ICD-10-CM | POA: Diagnosis not present

## 2014-11-13 DIAGNOSIS — K219 Gastro-esophageal reflux disease without esophagitis: Secondary | ICD-10-CM | POA: Diagnosis not present

## 2014-11-13 DIAGNOSIS — M109 Gout, unspecified: Secondary | ICD-10-CM | POA: Diagnosis not present

## 2014-11-13 DIAGNOSIS — I2510 Atherosclerotic heart disease of native coronary artery without angina pectoris: Secondary | ICD-10-CM | POA: Diagnosis not present

## 2014-11-20 ENCOUNTER — Telehealth: Payer: Self-pay

## 2014-11-20 NOTE — Telephone Encounter (Signed)
Contacted by Dr. Gertie Exon office of [facility] needing clarification that [name] is cleared for procedure they are scheduling in April.  Note in system from 2/12 pt is cleared for cataract surgery per Dr. [name].  I have left message with Dr. Lenon Oms office, for [name], to clarify the type procedure they are preforming.  Message also left for [name] to clarify procedure.  Pt was able to clarify that [name] is removing cataract from his right eye.  Paperwork to be faxed to Dr. Lenon Oms office.

## 2014-11-21 ENCOUNTER — Telehealth: Payer: Self-pay | Admitting: Internal Medicine

## 2014-11-21 NOTE — Telephone Encounter (Signed)
Called patient and left him a message that his clearance had been sent for his cataract surgery and no need to return unless he had more questions

## 2014-11-21 NOTE — Telephone Encounter (Signed)
New message    Patient calling stating he's returning the call back from a nurse on yesterday.

## 2014-12-02 ENCOUNTER — Encounter: Payer: Self-pay | Admitting: Internal Medicine

## 2014-12-02 ENCOUNTER — Telehealth: Payer: Self-pay | Admitting: Cardiology

## 2014-12-02 ENCOUNTER — Ambulatory Visit (INDEPENDENT_AMBULATORY_CARE_PROVIDER_SITE_OTHER): Admitting: *Deleted

## 2014-12-02 DIAGNOSIS — I2510 Atherosclerotic heart disease of native coronary artery without angina pectoris: Secondary | ICD-10-CM | POA: Diagnosis not present

## 2014-12-02 DIAGNOSIS — I5022 Chronic systolic (congestive) heart failure: Secondary | ICD-10-CM

## 2014-12-02 DIAGNOSIS — E785 Hyperlipidemia, unspecified: Secondary | ICD-10-CM | POA: Diagnosis not present

## 2014-12-02 DIAGNOSIS — I255 Ischemic cardiomyopathy: Secondary | ICD-10-CM

## 2014-12-02 DIAGNOSIS — I10 Essential (primary) hypertension: Secondary | ICD-10-CM | POA: Diagnosis not present

## 2014-12-02 DIAGNOSIS — D696 Thrombocytopenia, unspecified: Secondary | ICD-10-CM | POA: Diagnosis not present

## 2014-12-02 DIAGNOSIS — I5020 Unspecified systolic (congestive) heart failure: Secondary | ICD-10-CM | POA: Diagnosis not present

## 2014-12-02 DIAGNOSIS — I482 Chronic atrial fibrillation: Secondary | ICD-10-CM | POA: Diagnosis not present

## 2014-12-02 NOTE — Progress Notes (Signed)
Remote ICD transmission.   

## 2014-12-02 NOTE — Telephone Encounter (Signed)
LMOVM reminding pt to send remote transmission.   

## 2014-12-03 LAB — MDC_IDC_ENUM_SESS_TYPE_REMOTE
Battery Remaining Longevity: 88 mo
Battery Voltage: 2.99 V
Brady Statistic AP VP Percent: 30.96 %
Brady Statistic AS VP Percent: 58.44 %
Brady Statistic RV Percent Paced: 86.32 %
Date Time Interrogation Session: 20160329180755
HIGH POWER IMPEDANCE MEASURED VALUE: 48 Ohm
HighPow Impedance: 37 Ohm
Lead Channel Impedance Value: 285 Ohm
Lead Channel Impedance Value: 4047 Ohm
Lead Channel Impedance Value: 4047 Ohm
Lead Channel Impedance Value: 532 Ohm
Lead Channel Pacing Threshold Amplitude: 0.75 V
Lead Channel Pacing Threshold Amplitude: 1.375 V
Lead Channel Pacing Threshold Pulse Width: 0.4 ms
Lead Channel Pacing Threshold Pulse Width: 0.4 ms
Lead Channel Pacing Threshold Pulse Width: 0.4 ms
Lead Channel Sensing Intrinsic Amplitude: 3.5 mV
Lead Channel Sensing Intrinsic Amplitude: 4.875 mV
Lead Channel Setting Pacing Amplitude: 1.5 V
Lead Channel Setting Pacing Amplitude: 2 V
Lead Channel Setting Pacing Pulse Width: 0.4 ms
MDC IDC MSMT LEADCHNL RV IMPEDANCE VALUE: 342 Ohm
MDC IDC MSMT LEADCHNL RV IMPEDANCE VALUE: 456 Ohm
MDC IDC MSMT LEADCHNL RV PACING THRESHOLD AMPLITUDE: 0.625 V
MDC IDC SET LEADCHNL LV PACING AMPLITUDE: 2.5 V
MDC IDC SET LEADCHNL LV PACING PULSEWIDTH: 0.4 ms
MDC IDC SET LEADCHNL RV SENSING SENSITIVITY: 0.3 mV
MDC IDC SET ZONE DETECTION INTERVAL: 300 ms
MDC IDC SET ZONE DETECTION INTERVAL: 350 ms
MDC IDC STAT BRADY AP VS PERCENT: 0.66 %
MDC IDC STAT BRADY AS VS PERCENT: 9.93 %
MDC IDC STAT BRADY RA PERCENT PACED: 31.63 %
Zone Setting Detection Interval: 350 ms
Zone Setting Detection Interval: 450 ms

## 2014-12-05 DIAGNOSIS — I5040 Unspecified combined systolic (congestive) and diastolic (congestive) heart failure: Secondary | ICD-10-CM | POA: Diagnosis not present

## 2014-12-10 ENCOUNTER — Encounter: Payer: Self-pay | Admitting: Cardiology

## 2014-12-12 DIAGNOSIS — I5040 Unspecified combined systolic (congestive) and diastolic (congestive) heart failure: Secondary | ICD-10-CM | POA: Diagnosis not present

## 2014-12-16 ENCOUNTER — Other Ambulatory Visit: Payer: Self-pay | Admitting: Ophthalmology

## 2014-12-16 ENCOUNTER — Encounter (HOSPITAL_COMMUNITY): Payer: Self-pay | Admitting: *Deleted

## 2014-12-16 MED ORDER — TETRACAINE HCL 0.5 % OP SOLN: 1.0000 [drp] | OPHTHALMIC | Status: AC

## 2014-12-16 MED ORDER — GATIFLOXACIN 0.5 % OP SOLN
1.0000 [drp] | OPHTHALMIC | Status: AC | PRN
  Administered 2014-12-17 (×3): 1 [drp] via OPHTHALMIC
  Filled 2014-12-16: qty 2.5

## 2014-12-16 MED ORDER — PHENYLEPHRINE HCL 2.5 % OP SOLN: 1.0000 [drp] | OPHTHALMIC | Status: AC

## 2014-12-16 MED ORDER — CYCLOPENTOLATE HCL 1 % OP SOLN: 1.0000 [drp] | OPHTHALMIC | Status: AC

## 2014-12-16 MED ORDER — TROPICAMIDE 1 % OP SOLN
1.0000 [drp] | OPHTHALMIC | Status: AC
  Filled 2014-12-16: qty 3

## 2014-12-16 MED ORDER — KETOROLAC TROMETHAMINE 0.5 % OP SOLN: 1.0000 [drp] | OPHTHALMIC | Status: AC

## 2014-12-16 NOTE — H&P (Signed)
History & Physical:   DATE:   3- 14-16  NAME:  [name], [name]     [id number]       HISTORY OF PRESENT ILLNESS: Chief Eye Complaints blurry vision   patient does not check bs. No pain, vision has decrease.   HPI: EYES: Reports symptoms of vision disturbances.        LOCATION:   BOTH EYES        QUALITY/COURSE:   Reports condition is worsening.        INTENSITY/SEVERITY:    Reports measurement ( or degree) as moderate   DURATION:   Reports the general length of symptoms to be months.      ONSET/TIMING:   Reports occurrence as   CONTEXT/WHEN:   Reports usually associated with   MODIFIERS/TREATMENTS:  Improved by                ACTIVE PROBLEMS: Central retinal artery occlusion, left eye   ICD10: H34.12  ICD9:   Onset: 11/11/2014 17:45   Diabetes - Type 2 - unspecified   ICD10: E11.9  ICD9: 250.00  Onset: 07/09/2013 15:37   Benign hypertension   ICD10: I10  ICD9: 401.1  Onset: 07/09/2013 15:37   Blurring, visual   ICD10:   ICD9: 368.8  Onset:    Dry eye syndrome   ICD10: H04.129  ICD9: 375.15   Optic nerves, also suspicious for glaucoma Onset:       Age-related nuclear cataract, right eye   ICD10: H25.11  ICD9:   Onset: 12/10/2014 16:45  SURGERIES: d-fibrallitor  Pick List - Surgeries  phaco emulsion cataract extraction w/IOL OS  MEDICATIONS: Pataday: 0.2% solution SIG-  1 drop Ou qd    Ciloxan (Ciprofloxacin) Solution: 0.3% solution SIG-  2 drop(s)  every 4 hours  REVIEW OF SYSTEMS: ROS:   GEN- Constitutional: HENT: GEN - Endocrine: Reports symptoms of LUNGS/Respiratory:  HEART/Cardiovascular: Reports symptoms of heart trouble, hypertension.    +++++      Pacemaker, stent ABD/Gastrointestinal: +++++      constipation     Musculoskeletal (BJE): NEURO/Neurological: PSYCH/Psychiatric:    Is the pt oriented to time, place, person? yes Mood depressed __ normal  agitated __  TOBACCO: Never smoker   ICD10: Z87.898 ICD9: V13.89   Pick List - Tobacco  - Summary  SOCIAL HISTORY: Starter Pick List - Social History  FAMILY HISTORY: Family History - 1st Degree Relatives:  Father is dead.     ALLERGIES: Drug Allergies.  No Known.   Starter - Allergies - Summary:  PHYSICAL EXAMINATION: VS: BMI: 24.2.  BP: 118/75.  H: 65.00 in.  P: 76 /min.  RR: 20 /min.  W: 145lbs 0oz.    Va     OD:cc 20/50  ph 20/40 OS:cc 20/400 ph 20/NI +  EYEGLASSES: OD   -0.50 +3.00 x005 OS  -0.25 +2.00 x095 ADD:    +3.25  MR  11/11/2014 16:40  OD -1.50 +2.75 x005   20/40 OS -1.00 +0.25 x095   20/400 ADD   +3.25  VF:   OD   FULL IN ALL FOUR QUADRANTS   OS   FULL IN ALL FOUR QUADRANTS  Motility full  PUPILS:  38mm - MG  EYELIDS & OCULAR ADNEXAnormal  SLE: Conjunctiva: pinguecula each eye  Cornea: arcus with decrease tear film each eye, old scar from previous pterygium OS   anterior  chamber:  Deep and quiet each eye  Iris: Brown each eye  Lens:2 nuclear sclerosis OD psc , posterior chamber intraocular lens implant OS  Vitreous:  CCT  Ta   in mmHg    OD  14        OS 14 Time: 11/11/2014 18:09   Gonio   Dilation   phenylephrine 2.5% and trop 1%  Fundus:  optic nerve   TK:ZSWF nerve 50% cup                                                 UX:NATF nerve 35% cup   Macula       TD:DUKGU                                                OS: dot and blot hemorrhages  Vessels: Increased tortuosity OS  Periphery: Flat each eye   Visual Fields  (PT STATES I GOT TIRED OF PLAYING "THAT GAME")    Exam: GENERAL: Appearance: HEAD, EARS, NOSE AND THROAT: Ears-Nose (external) Inspection: Externally, nose and ears are normal in appearance and without scars, lesions, or nodules.      Hearing assessment shows no problems with normal conversation.      LUNGS and RESPIRATORY: Lung auscultation elicits no wheezing, rhonci, rales or rubs and with equal breath sounds.    Respiratory effort described as breathing is unlabored and chest movement is  symmetrical.    HEART (Cardiovascular): Heart auscultation discovers murmur.    ABDOMEN (Gastrointestinal): Mass/Tenderness Exam: Neither are present.     MUSCULOSKELETAL (BJE): Inspection-Palpation: No major bone, joint, tendon, or muscle changes.      NEUROLOGICAL: Alert and oriented. No major deficits of coordination or sensation.      PSYCHIATRIC: Insight and judgment appear  both to be intact and appropriate.    Mood and affect are described as normal mood and full affect.    SKIN: Skin Inspection: No rashes or lesions  ADMITTING DIAGNOSIS:    Age-related nuclear cataract, right eye   ICD10: H25.11  ICD9:   Onset: 12/10/2014 16:45 Central retinal artery occlusion, left eye   ICD10: H34.12  ICD9:   Onset: 11/11/2014 17:45  Initial Date:    Diabetes - Type 2 - unspecified   ICD10: E11.9  ICD9: 250.00  Onset: 07/09/2013 15:37  Initial Date:    Benign hypertension   ICD10: I10  ICD9: 401.1  Onset: 07/09/2013 15:37  Initial Date:    Blurring, visual   ICD10:   ICD9: 368.8   Dry eye syndrome   ICD10: H04.129  ICD9: 375.15  Optic nerves, also suspicious for glaucoma     SURGICAL TREATMENT PLAN:  recent note from  Dr. Zadie Rhine   phaco emulsion cataract extraction  W  intraocular lens implant  OD    Risk and benefits of surgery have been reviewed with the patient and the patient agrees to proceed with the surgical procedure.          Actions:          Handouts: Hypertension.    ___________________________ Marylynn Pearson, Brooke Bonito Starter - Inactive Problems:

## 2014-12-17 ENCOUNTER — Ambulatory Visit (HOSPITAL_COMMUNITY)
Admission: RE | Admit: 2014-12-17 | Discharge: 2014-12-17 | Disposition: A | Discharge status: Home / Self Care | Payer: Medicare Other | Source: Ambulatory Visit | Attending: Ophthalmology | Admitting: Ophthalmology

## 2014-12-17 ENCOUNTER — Ambulatory Visit (HOSPITAL_COMMUNITY): Payer: Medicare Other | Admitting: Anesthesiology

## 2014-12-17 ENCOUNTER — Encounter (HOSPITAL_COMMUNITY)
Admission: RE | Disposition: A | Discharge status: Home / Self Care | Payer: Self-pay | Source: Ambulatory Visit | Attending: Ophthalmology

## 2014-12-17 ENCOUNTER — Encounter (HOSPITAL_COMMUNITY): Payer: Self-pay

## 2014-12-17 DIAGNOSIS — E119 Type 2 diabetes mellitus without complications: Secondary | ICD-10-CM | POA: Diagnosis not present

## 2014-12-17 DIAGNOSIS — I252 Old myocardial infarction: Secondary | ICD-10-CM | POA: Insufficient documentation

## 2014-12-17 DIAGNOSIS — K219 Gastro-esophageal reflux disease without esophagitis: Secondary | ICD-10-CM | POA: Diagnosis not present

## 2014-12-17 DIAGNOSIS — H268 Other specified cataract: Secondary | ICD-10-CM | POA: Diagnosis not present

## 2014-12-17 DIAGNOSIS — M1990 Unspecified osteoarthritis, unspecified site: Secondary | ICD-10-CM | POA: Diagnosis not present

## 2014-12-17 DIAGNOSIS — H2511 Age-related nuclear cataract, right eye: Secondary | ICD-10-CM | POA: Insufficient documentation

## 2014-12-17 DIAGNOSIS — I10 Essential (primary) hypertension: Secondary | ICD-10-CM | POA: Insufficient documentation

## 2014-12-17 DIAGNOSIS — I2510 Atherosclerotic heart disease of native coronary artery without angina pectoris: Secondary | ICD-10-CM | POA: Diagnosis not present

## 2014-12-17 HISTORY — DX: Unspecified intestinal obstruction, unspecified as to partial versus complete obstruction: K56.609

## 2014-12-17 HISTORY — PX: CATARACT EXTRACTION W/PHACO: SHX586

## 2014-12-17 HISTORY — DX: Gastro-esophageal reflux disease without esophagitis: K21.9

## 2014-12-17 HISTORY — DX: Reserved for inherently not codable concepts without codable children: IMO0001

## 2014-12-17 LAB — CBC
HEMATOCRIT: 29.4 % — AB (ref 39.0–52.0)
Hemoglobin: 10.3 g/dL — ABNORMAL LOW (ref 13.0–17.0)
MCH: 32.4 pg (ref 26.0–34.0)
MCHC: 35 g/dL (ref 30.0–36.0)
MCV: 92.5 fL (ref 78.0–100.0)
Platelets: 131 10*3/uL — ABNORMAL LOW (ref 150–400)
RBC: 3.18 MIL/uL — ABNORMAL LOW (ref 4.22–5.81)
RDW: 15.2 % (ref 11.5–15.5)
WBC: 3.1 10*3/uL — ABNORMAL LOW (ref 4.0–10.5)

## 2014-12-17 LAB — BASIC METABOLIC PANEL
ANION GAP: 10 (ref 5–15)
BUN: 34 mg/dL — ABNORMAL HIGH (ref 6–23)
CO2: 22 mmol/L (ref 19–32)
Calcium: 9.1 mg/dL (ref 8.4–10.5)
Chloride: 103 mmol/L (ref 96–112)
Creatinine, Ser: 1.36 mg/dL — ABNORMAL HIGH (ref 0.50–1.35)
GFR calc Af Amer: 53 mL/min — ABNORMAL LOW (ref >90)
GFR calc non Af Amer: 46 mL/min — ABNORMAL LOW (ref >90)
GLUCOSE: 138 mg/dL — AB (ref 70–99)
Potassium: 4.4 mmol/L (ref 3.5–5.1)
SODIUM: 135 mmol/L (ref 135–145)

## 2014-12-17 LAB — GLUCOSE, CAPILLARY: GLUCOSE-CAPILLARY: 112 mg/dL — AB (ref 70–99)

## 2014-12-17 SURGERY — PHACOEMULSIFICATION, CATARACT, WITH IOL INSERTION
Anesthesia: Monitor Anesthesia Care | Site: Eye | Laterality: Right

## 2014-12-17 MED ORDER — PHENYLEPHRINE HCL 2.5 % OP SOLN
1.0000 [drp] | OPHTHALMIC | Status: AC
  Filled 2014-12-17 (×2): qty 2

## 2014-12-17 MED ORDER — LIDOCAINE HCL 2 % IJ SOLN
INTRAMUSCULAR | Status: AC
  Filled 2014-12-17: qty 20

## 2014-12-17 MED ORDER — SODIUM CHLORIDE 0.9 % IV SOLN
INTRAVENOUS | Status: DC
  Administered 2014-12-17: 10:00:00 via INTRAVENOUS

## 2014-12-17 MED ORDER — SODIUM HYALURONATE 10 MG/ML IO SOLN
INTRAOCULAR | Status: DC | PRN
  Administered 2014-12-17: 0.85 mL via INTRAOCULAR

## 2014-12-17 MED ORDER — ACETYLCHOLINE CHLORIDE 1:100 IO SOLR
INTRAOCULAR | Status: AC
  Filled 2014-12-17: qty 1

## 2014-12-17 MED ORDER — LIDOCAINE-EPINEPHRINE 2 %-1:100000 IJ SOLN
INTRAMUSCULAR | Status: DC | PRN
  Administered 2014-12-17: 4 mL via RETROBULBAR

## 2014-12-17 MED ORDER — PROPOFOL 10 MG/ML IV BOLUS
INTRAVENOUS | Status: AC
  Filled 2014-12-17: qty 20

## 2014-12-17 MED ORDER — 0.9 % SODIUM CHLORIDE (POUR BTL) OPTIME
TOPICAL | Status: DC | PRN
  Administered 2014-12-17: 200 mL

## 2014-12-17 MED ORDER — KETOROLAC TROMETHAMINE 0.5 % OP SOLN
1.0000 [drp] | OPHTHALMIC | Status: AC
  Administered 2014-12-17 (×3): 1 [drp] via OPHTHALMIC
  Filled 2014-12-17: qty 5
  Filled 2014-12-17: qty 3

## 2014-12-17 MED ORDER — DEXAMETHASONE SODIUM PHOSPHATE 10 MG/ML IJ SOLN
INTRAMUSCULAR | Status: AC
  Filled 2014-12-17: qty 1

## 2014-12-17 MED ORDER — DEXAMETHASONE SODIUM PHOSPHATE 4 MG/ML IJ SOLN
INTRAMUSCULAR | Status: AC
  Filled 2014-12-17: qty 1

## 2014-12-17 MED ORDER — PILOCARPINE HCL 4 % OP SOLN
OPHTHALMIC | Status: AC
Start: 2014-12-17 — End: 2014-12-17
  Filled 2014-12-17: qty 15

## 2014-12-17 MED ORDER — PHENYLEPHRINE HCL 2.5 % OP SOLN
1.0000 [drp] | OPHTHALMIC | Status: AC
  Administered 2014-12-17 (×3): 1 [drp] via OPHTHALMIC
  Filled 2014-12-17: qty 2

## 2014-12-17 MED ORDER — SODIUM HYALURONATE 10 MG/ML IO SOLN
INTRAOCULAR | Status: AC
  Filled 2014-12-17: qty 0.85

## 2014-12-17 MED ORDER — TROPICAMIDE 1 % OP SOLN
1.0000 [drp] | OPHTHALMIC | Status: AC
  Administered 2014-12-17 (×3): 1 [drp] via OPHTHALMIC
  Filled 2014-12-17: qty 2

## 2014-12-17 MED ORDER — TOBRAMYCIN-DEXAMETHASONE 0.3-0.1 % OP OINT
TOPICAL_OINTMENT | OPHTHALMIC | Status: AC
  Filled 2014-12-17: qty 3.5

## 2014-12-17 MED ORDER — EPINEPHRINE HCL 1 MG/ML IJ SOLN
INTRAMUSCULAR | Status: AC
  Filled 2014-12-17: qty 1

## 2014-12-17 MED ORDER — NA CHONDROIT SULF-NA HYALURON 40-30 MG/ML IO SOLN
INTRAOCULAR | Status: DC | PRN
  Administered 2014-12-17: 0.5 mL via INTRAOCULAR

## 2014-12-17 MED ORDER — LIDOCAINE-EPINEPHRINE 2 %-1:100000 IJ SOLN
INTRAMUSCULAR | Status: AC
  Filled 2014-12-17: qty 1

## 2014-12-17 MED ORDER — ONDANSETRON HCL 4 MG/2ML IJ SOLN
INTRAMUSCULAR | Status: AC
  Filled 2014-12-17: qty 2

## 2014-12-17 MED ORDER — HYALURONIDASE HUMAN 150 UNIT/ML IJ SOLN
INTRAMUSCULAR | Status: AC
  Filled 2014-12-17: qty 1

## 2014-12-17 MED ORDER — BUPIVACAINE HCL (PF) 0.75 % IJ SOLN
INTRAMUSCULAR | Status: AC
  Filled 2014-12-17: qty 10

## 2014-12-17 MED ORDER — BSS IO SOLN
INTRAOCULAR | Status: AC
  Filled 2014-12-17: qty 15

## 2014-12-17 MED ORDER — TETRACAINE HCL 0.5 % OP SOLN
OPHTHALMIC | Status: AC
  Filled 2014-12-17: qty 2

## 2014-12-17 MED ORDER — GENTAMICIN SULFATE 40 MG/ML IJ SOLN
INTRAMUSCULAR | Status: AC
  Filled 2014-12-17: qty 2

## 2014-12-17 MED ORDER — TOBRAMYCIN 0.3 % OP OINT
TOPICAL_OINTMENT | OPHTHALMIC | Status: DC | PRN
  Administered 2014-12-17: 1 via OPHTHALMIC

## 2014-12-17 MED ORDER — TROPICAMIDE 1 % OP SOLN
1.0000 [drp] | OPHTHALMIC | Status: AC
  Filled 2014-12-17: qty 2

## 2014-12-17 MED ORDER — ACETYLCHOLINE CHLORIDE 1:100 IO SOLR
INTRAOCULAR | Status: DC | PRN
  Administered 2014-12-17: 20 mg via INTRAOCULAR

## 2014-12-17 MED ORDER — BSS IO SOLN
INTRAOCULAR | Status: AC
  Filled 2014-12-17: qty 500

## 2014-12-17 MED ORDER — TETRACAINE 0.5 % OP SOLN OPTIME - NO CHARGE
OPHTHALMIC | Status: DC | PRN
  Administered 2014-12-17: 2 [drp] via OPHTHALMIC

## 2014-12-17 MED ORDER — NA CHONDROIT SULF-NA HYALURON 40-30 MG/ML IO SOLN
INTRAOCULAR | Status: AC
  Filled 2014-12-17: qty 0.5

## 2014-12-17 MED ORDER — EPINEPHRINE HCL 1 MG/ML IJ SOLN
INTRAOCULAR | Status: DC | PRN
  Administered 2014-12-17: 500 mL

## 2014-12-17 MED ORDER — CYCLOPENTOLATE HCL 1 % OP SOLN
1.0000 [drp] | OPHTHALMIC | Status: AC
  Administered 2014-12-17 (×3): 1 [drp] via OPHTHALMIC
  Filled 2014-12-17 (×2): qty 2

## 2014-12-17 MED ORDER — PROPOFOL 10 MG/ML IV BOLUS
INTRAVENOUS | Status: DC | PRN
  Administered 2014-12-17: 30 mg via INTRAVENOUS
  Administered 2014-12-17: 10 mg via INTRAVENOUS

## 2014-12-17 SURGICAL SUPPLY — 34 items
APL SRG 3 HI ABS STRL LF PLS (MISCELLANEOUS) ×1
APPLICATOR COTTON TIP 6IN STRL (MISCELLANEOUS) ×3 IMPLANT
APPLICATOR DR MATTHEWS STRL (MISCELLANEOUS) ×3 IMPLANT
BLADE KERATOME 2.75 (BLADE) ×2 IMPLANT
BLADE KERATOME 2.75MM (BLADE) ×1
CANNULA ANTERIOR CHAMBER 27GA (MISCELLANEOUS) ×3 IMPLANT
CORDS BIPOLAR (ELECTRODE) IMPLANT
COVER MAYO STAND STRL (DRAPES) ×3 IMPLANT
DRAPE OPHTHALMIC 40X48 W POUCH (DRAPES) ×3 IMPLANT
DRAPE RETRACTOR (MISCELLANEOUS) ×3 IMPLANT
GLOVE BIO SURGEON STRL SZ7.5 (GLOVE) ×3 IMPLANT
GLOVE BIO SURGEON STRL SZ8 (GLOVE) ×3 IMPLANT
GLOVE SURG SS PI 7.0 STRL IVOR (GLOVE) ×3 IMPLANT
GOWN STRL REUS W/ TWL LRG LVL3 (GOWN DISPOSABLE) ×1 IMPLANT
GOWN STRL REUS W/TWL LRG LVL3 (GOWN DISPOSABLE) ×3
KIT BASIN OR (CUSTOM PROCEDURE TRAY) ×3 IMPLANT
KIT ROOM TURNOVER OR (KITS) IMPLANT
LENS IOL ACRSF IQ PC 23.5 (Intraocular Lens) ×1 IMPLANT
LENS IOL ACRYSOF IQ POST 23.5 (Intraocular Lens) ×3 IMPLANT
NEEDLE 18GX1X1/2 (RX/OR ONLY) (NEEDLE) IMPLANT
NEEDLE 25GX 5/8IN NON SAFETY (NEEDLE) ×6 IMPLANT
NEEDLE FILTER BLUNT 18X 1/2SAF (NEEDLE) ×2
NEEDLE FILTER BLUNT 18X1 1/2 (NEEDLE) ×1 IMPLANT
NS IRRIG 1000ML POUR BTL (IV SOLUTION) ×3 IMPLANT
PACK CATARACT CUSTOM (CUSTOM PROCEDURE TRAY) ×3 IMPLANT
PAD ARMBOARD 7.5X6 YLW CONV (MISCELLANEOUS) ×3 IMPLANT
PAK PIK CVS CATARACT (OPHTHALMIC) ×3 IMPLANT
SUT ETHILON 10 0 CS140 6 (SUTURE) ×3 IMPLANT
SUT SILK 6 0 G 6 (SUTURE) IMPLANT
SYR TB 1ML LUER SLIP (SYRINGE) ×3 IMPLANT
TIP ABS 45DEG FLARED 0.9MM (TIP) ×3 IMPLANT
TOWEL OR 17X26 10 PK STRL BLUE (TOWEL DISPOSABLE) IMPLANT
WATER STERILE IRR 1000ML POUR (IV SOLUTION) ×3 IMPLANT
WIPE INSTRUMENT VISIWIPE 73X73 (MISCELLANEOUS) IMPLANT

## 2014-12-17 NOTE — Discharge Instructions (Signed)
Patient may remove the eye patch today at 2:30 and applied the eyedrops given to him at the office. He should wear her eyeglasses or sunglasses at all times use the plastic eye she'll over his eye when he goes asleep. Avoid heavy lifting bending or straining and do not rub the right eye. Sleep on his back or left side did not sleep on the right side.

## 2014-12-17 NOTE — Anesthesia Preprocedure Evaluation (Addendum)
Anesthesia Evaluation   Patient awake    Reviewed: Allergy & Precautions, NPO status , Patient's Chart, lab work & pertinent test results  History of Anesthesia Complications Negative for: history of anesthetic complications  Airway Mallampati: III  TM Distance: >3 FB     Dental  (+) Edentulous Upper, Edentulous Lower   Pulmonary shortness of breath and Long-Term Oxygen Therapy,  breath sounds clear to auscultation        Cardiovascular Exercise Tolerance: Poor hypertension, + CAD, + Past MI and +CHF + Cardiac Defibrillator Rhythm:Regular Rate:Normal  07/2014: Study Conclusions  - Left ventricle: The cavity size was mildly dilated. Systolic function was severely reduced. The estimated ejection fraction was in the range of 20% to 25%. Diffuse hypokinesis. - Mitral valve: There was mild regurgitation. - Atrial septum: No defect or patent foramen ovale was identified. - Tricuspid valve: There was moderate regurgitation.   Neuro/Psych    GI/Hepatic GERD-  ,  Endo/Other  diabetes  Renal/GU Renal disease  negative genitourinary   Musculoskeletal  (+) Arthritis -,   Abdominal   Peds negative pediatric ROS (+)  Hematology   Anesthesia Other Findings   Reproductive/Obstetrics                            Anesthesia Physical Anesthesia Plan  ASA: IV  Anesthesia Plan: MAC   Post-op Pain Management:    Induction:   Airway Management Planned: Natural Airway  Additional Equipment:   Intra-op Plan:   Post-operative Plan:   Informed Consent: I have reviewed the patients History and Physical, chart, labs and discussed the procedure including the risks, benefits and alternatives for the proposed anesthesia with the patient or authorized representative who has indicated his/her understanding and acceptance.     Plan Discussed with: CRNA  Anesthesia Plan Comments:          Anesthesia Quick Evaluation

## 2014-12-17 NOTE — Anesthesia Procedure Notes (Signed)
Procedure Name: MAC Date/Time: 12/17/2014 10:42 AM Performed by: Merrilyn Puma B Pre-anesthesia Checklist: Emergency Drugs available, Patient identified, Suction available, Patient being monitored and Timeout performed Patient Re-evaluated:Patient Re-evaluated prior to inductionOxygen Delivery Method: Nasal cannula Intubation Type: IV induction Placement Confirmation: positive ETCO2 and breath sounds checked- equal and bilateral Dental Injury: Teeth and Oropharynx as per pre-operative assessment

## 2014-12-17 NOTE — Interval H&P Note (Signed)
History and Physical Interval Note:  12/17/2014 10:22 AM  [name]  has presented today for surgery, with the diagnosis of AGE RELATED NUCLEAR CATARACT  The various methods of treatment have been discussed with the patient and family. After consideration of risks, benefits and other options for treatment, the patient has consented to  Procedure(s): PHACOEMULSIFICATION CATARACT EXTRACTION WITH IOL IMPLANT RIGHT EYE (Right) as a surgical intervention .  The patient's history has been reviewed, patient examined, no change in status, stable for surgery.  I have reviewed the patient's chart and labs.  Questions were answered to the patient's satisfaction.     Erica Richwine

## 2014-12-17 NOTE — Anesthesia Postprocedure Evaluation (Signed)
  Anesthesia Post-op Note  Patient: [name]  Procedure(s) Performed: Procedure(s): PHACOEMULSIFICATION CATARACT EXTRACTION WITH IOL IMPLANT RIGHT EYE (Right)  Patient Location: PACU  Anesthesia Type:MAC  Level of Consciousness: awake, alert  and oriented  Airway and Oxygen Therapy: Patient Spontanous Breathing  Post-op Pain: none  Post-op Assessment: Post-op Vital signs reviewed, Patient's Cardiovascular Status Stable, Respiratory Function Stable, Patent Airway and No signs of Nausea or vomiting  Post-op Vital Signs: Reviewed and stable  Last Vitals:  Filed Vitals:   12/17/14 1145  BP:   Pulse: 56  Temp: 36.7 C  Resp: 18    Complications: No apparent anesthesia complications

## 2014-12-17 NOTE — Transfer of Care (Signed)
Immediate Anesthesia Transfer of Care Note  Patient: [name]  Procedure(s) Performed: Procedure(s): PHACOEMULSIFICATION CATARACT EXTRACTION WITH IOL IMPLANT RIGHT EYE (Right)  Patient Location: PACU  Anesthesia Type:MAC  Level of Consciousness: awake, alert  and oriented  Airway & Oxygen Therapy: Patient Spontanous Breathing  Post-op Assessment: Report given to RN, Post -op Vital signs reviewed and stable and Patient moving all extremities  Post vital signs: Reviewed and stable  Last Vitals:  Filed Vitals:   12/17/14 0801  BP: 111/55  Pulse: 59  Temp: 36.4 C  Resp: 18    Complications: No apparent anesthesia complications

## 2014-12-17 NOTE — H&P (View-Only) (Signed)
History & Physical:   DATE:   3- 14-16  NAME:  [name], [name]     [id number]       HISTORY OF PRESENT ILLNESS: Chief Eye Complaints blurry vision   patient does not check bs. No pain, vision has decrease.   HPI: EYES: Reports symptoms of vision disturbances.        LOCATION:   BOTH EYES        QUALITY/COURSE:   Reports condition is worsening.        INTENSITY/SEVERITY:    Reports measurement ( or degree) as moderate   DURATION:   Reports the general length of symptoms to be months.      ONSET/TIMING:   Reports occurrence as   CONTEXT/WHEN:   Reports usually associated with   MODIFIERS/TREATMENTS:  Improved by                ACTIVE PROBLEMS: Central retinal artery occlusion, left eye   ICD10: H34.12  ICD9:   Onset: 11/11/2014 17:45   Diabetes - Type 2 - unspecified   ICD10: E11.9  ICD9: 250.00  Onset: 07/09/2013 15:37   Benign hypertension   ICD10: I10  ICD9: 401.1  Onset: 07/09/2013 15:37   Blurring, visual   ICD10:   ICD9: 368.8  Onset:    Dry eye syndrome   ICD10: H04.129  ICD9: 375.15   Optic nerves, also suspicious for glaucoma Onset:       Age-related nuclear cataract, right eye   ICD10: H25.11  ICD9:   Onset: 12/10/2014 16:45  SURGERIES: d-fibrallitor  Pick List - Surgeries  phaco emulsion cataract extraction w/IOL OS  MEDICATIONS: Pataday: 0.2% solution SIG-  1 drop Ou qd    Ciloxan (Ciprofloxacin) Solution: 0.3% solution SIG-  2 drop(s)  every 4 hours  REVIEW OF SYSTEMS: ROS:   GEN- Constitutional: HENT: GEN - Endocrine: Reports symptoms of LUNGS/Respiratory:  HEART/Cardiovascular: Reports symptoms of heart trouble, hypertension.    +++++      Pacemaker, stent ABD/Gastrointestinal: +++++      constipation     Musculoskeletal (BJE): NEURO/Neurological: PSYCH/Psychiatric:    Is the pt oriented to time, place, person? yes Mood depressed __ normal  agitated __  TOBACCO: Never smoker   ICD10: Z87.898 ICD9: V13.89   Pick List - Tobacco  - Summary  SOCIAL HISTORY: Starter Pick List - Social History  FAMILY HISTORY: Family History - 1st Degree Relatives:  Father is dead.     ALLERGIES: Drug Allergies.  No Known.   Starter - Allergies - Summary:  PHYSICAL EXAMINATION: VS: BMI: 24.2.  BP: 118/75.  H: 65.00 in.  P: 76 /min.  RR: 20 /min.  W: 145lbs 0oz.    Va     OD:cc 20/50  ph 20/40 OS:cc 20/400 ph 20/NI +  EYEGLASSES: OD   -0.50 +3.00 x005 OS  -0.25 +2.00 x095 ADD:    +3.25  MR  11/11/2014 16:40  OD -1.50 +2.75 x005   20/40 OS -1.00 +0.25 x095   20/400 ADD   +3.25  VF:   OD   FULL IN ALL FOUR QUADRANTS   OS   FULL IN ALL FOUR QUADRANTS  Motility full  PUPILS:  82mm - MG  EYELIDS & OCULAR ADNEXAnormal  SLE: Conjunctiva: pinguecula each eye  Cornea: arcus with decrease tear film each eye, old scar from previous pterygium OS   anterior  chamber:  Deep and quiet each eye  Iris: Brown each eye  Lens:2 nuclear sclerosis OD psc , posterior chamber intraocular lens implant OS  Vitreous:  CCT  Ta   in mmHg    OD  14        OS 14 Time: 11/11/2014 18:09   Gonio   Dilation   phenylephrine 2.5% and trop 1%  Fundus:  optic nerve   WE:RXVQ nerve 50% cup                                                 MG:QQPY nerve 35% cup   Macula       PP:JKDTO                                                OS: dot and blot hemorrhages  Vessels: Increased tortuosity OS  Periphery: Flat each eye   Visual Fields  (PT STATES I GOT TIRED OF PLAYING "THAT GAME")    Exam: GENERAL: Appearance: HEAD, EARS, NOSE AND THROAT: Ears-Nose (external) Inspection: Externally, nose and ears are normal in appearance and without scars, lesions, or nodules.      Hearing assessment shows no problems with normal conversation.      LUNGS and RESPIRATORY: Lung auscultation elicits no wheezing, rhonci, rales or rubs and with equal breath sounds.    Respiratory effort described as breathing is unlabored and chest movement is  symmetrical.    HEART (Cardiovascular): Heart auscultation discovers murmur.    ABDOMEN (Gastrointestinal): Mass/Tenderness Exam: Neither are present.     MUSCULOSKELETAL (BJE): Inspection-Palpation: No major bone, joint, tendon, or muscle changes.      NEUROLOGICAL: Alert and oriented. No major deficits of coordination or sensation.      PSYCHIATRIC: Insight and judgment appear  both to be intact and appropriate.    Mood and affect are described as normal mood and full affect.    SKIN: Skin Inspection: No rashes or lesions  ADMITTING DIAGNOSIS:    Age-related nuclear cataract, right eye   ICD10: H25.11  ICD9:   Onset: 12/10/2014 16:45 Central retinal artery occlusion, left eye   ICD10: H34.12  ICD9:   Onset: 11/11/2014 17:45  Initial Date:    Diabetes - Type 2 - unspecified   ICD10: E11.9  ICD9: 250.00  Onset: 07/09/2013 15:37  Initial Date:    Benign hypertension   ICD10: I10  ICD9: 401.1  Onset: 07/09/2013 15:37  Initial Date:    Blurring, visual   ICD10:   ICD9: 368.8   Dry eye syndrome   ICD10: H04.129  ICD9: 375.15  Optic nerves, also suspicious for glaucoma     SURGICAL TREATMENT PLAN:  recent note from  Dr. Zadie Rhine   phaco emulsion cataract extraction  W  intraocular lens implant  OD    Risk and benefits of surgery have been reviewed with the patient and the patient agrees to proceed with the surgical procedure.          Actions:          Handouts: Hypertension.    ___________________________ Marylynn Pearson, Brooke Bonito Starter - Inactive Problems:

## 2014-12-17 NOTE — Op Note (Signed)
Preoperative diagnosis: Visually significant cataract right eye Postop diagnosis: Same Procedure phacoemulsification with intraocular lens implant Complications: None Assistant: Unknown Foley Anesthesia: 2% Xylocaine with epinephrine in a 50/50 mixture of 0.75% Marcaine with an ampule Wydase Procedure: The patient was transported to the operating room where he was given a peribulbar block with the aforementioned local anesthetic agent. Following this the patient's face was prepped and draped in the usual sterile fashion. With the surgeon sitting temporally the operating microscope in position a Weck-Cel sponges used to fixate the globe and a 15 blade was used to enter through superior clear cornea. Viscoat was injected to inflate the anterior chamber using an additional Weck-Cel sponge a 2.75 mm keratome blade was used in a stepwise fashion through temporal clear cornea to into the eye. A bent 25-gauge needle was used to incise the anterior capsule and a continuous tear curvilinear capsulorrhexis was formed. The anterior capsule was removed with the Utrata forceps. BSS was used to hydrodissect the hydrodelineate the nucleus the nucleus was noted to rise out of the capsular bag this point. The phacoemulsification unit was then used to remove the epinucleus and sculpt the nucleus forming a deep trough the nucleus was then divided into 3 quadrants using the fake oh tip and the snapper hook. The nuclear fragments were aspirated from the eye. Viscoat was used to dissect the remaining nuclear fragment from the posterior capsule and the phacoemulsification unit was used to remove all nuclear fragments from the eye. Provisc was then injected into the anterior chamber. The intraocular lens implant was examined and noted to have no defects. The lens was an Alcon AcrySof IQ lens SN 60 WF 23.5 dpt SN number [id number]. The lens is placed in the lens injector and injected in the capsular bag it was position with a Kuglen  hook the irrigation-aspiration device was then used to remove viscoelastic from the eye and to remove subincisional cortex. Mauch call was injected in the anterior chamber to constrict the pupil the eye was pressurized and there being no leakage the incision was hydrated all instruments were removed from the eye topical Tobradex ointment was applied to the eye a patch and [name] shield were placed and the patient returned to recovery area in stable condition Marylynn Pearson Junior M.D.

## 2014-12-18 ENCOUNTER — Encounter (HOSPITAL_COMMUNITY): Payer: Self-pay | Admitting: Ophthalmology

## 2014-12-19 DIAGNOSIS — I5040 Unspecified combined systolic (congestive) and diastolic (congestive) heart failure: Secondary | ICD-10-CM | POA: Diagnosis not present

## 2014-12-20 DIAGNOSIS — I5040 Unspecified combined systolic (congestive) and diastolic (congestive) heart failure: Secondary | ICD-10-CM | POA: Diagnosis not present

## 2014-12-24 DIAGNOSIS — I5040 Unspecified combined systolic (congestive) and diastolic (congestive) heart failure: Secondary | ICD-10-CM | POA: Diagnosis not present

## 2014-12-26 DIAGNOSIS — I5040 Unspecified combined systolic (congestive) and diastolic (congestive) heart failure: Secondary | ICD-10-CM | POA: Diagnosis not present

## 2015-01-02 DIAGNOSIS — I5040 Unspecified combined systolic (congestive) and diastolic (congestive) heart failure: Secondary | ICD-10-CM | POA: Diagnosis not present

## 2015-01-04 DIAGNOSIS — I5040 Unspecified combined systolic (congestive) and diastolic (congestive) heart failure: Secondary | ICD-10-CM | POA: Diagnosis not present

## 2015-01-06 DIAGNOSIS — Z125 Encounter for screening for malignant neoplasm of prostate: Secondary | ICD-10-CM | POA: Diagnosis not present

## 2015-01-06 DIAGNOSIS — R739 Hyperglycemia, unspecified: Secondary | ICD-10-CM | POA: Diagnosis not present

## 2015-01-06 DIAGNOSIS — E785 Hyperlipidemia, unspecified: Secondary | ICD-10-CM | POA: Diagnosis not present

## 2015-01-06 DIAGNOSIS — Z79899 Other long term (current) drug therapy: Secondary | ICD-10-CM | POA: Diagnosis not present

## 2015-01-06 DIAGNOSIS — M109 Gout, unspecified: Secondary | ICD-10-CM | POA: Diagnosis not present

## 2015-01-07 DIAGNOSIS — I5040 Unspecified combined systolic (congestive) and diastolic (congestive) heart failure: Secondary | ICD-10-CM | POA: Diagnosis not present

## 2015-01-09 DIAGNOSIS — I5040 Unspecified combined systolic (congestive) and diastolic (congestive) heart failure: Secondary | ICD-10-CM | POA: Diagnosis not present

## 2015-01-13 ENCOUNTER — Encounter (HOSPITAL_COMMUNITY): Payer: Self-pay | Admitting: Ophthalmology

## 2015-01-13 NOTE — OR Nursing (Signed)
Late entry for Delay Code documentation.

## 2015-01-16 DIAGNOSIS — Z0000 Encounter for general adult medical examination without abnormal findings: Secondary | ICD-10-CM | POA: Diagnosis not present

## 2015-01-16 DIAGNOSIS — M109 Gout, unspecified: Secondary | ICD-10-CM | POA: Diagnosis not present

## 2015-01-16 DIAGNOSIS — N183 Chronic kidney disease, stage 3 (moderate): Secondary | ICD-10-CM | POA: Diagnosis not present

## 2015-01-16 DIAGNOSIS — Z95810 Presence of automatic (implantable) cardiac defibrillator: Secondary | ICD-10-CM | POA: Diagnosis not present

## 2015-01-16 DIAGNOSIS — I5022 Chronic systolic (congestive) heart failure: Secondary | ICD-10-CM | POA: Diagnosis not present

## 2015-01-16 DIAGNOSIS — I5040 Unspecified combined systolic (congestive) and diastolic (congestive) heart failure: Secondary | ICD-10-CM | POA: Diagnosis not present

## 2015-01-16 DIAGNOSIS — E785 Hyperlipidemia, unspecified: Secondary | ICD-10-CM | POA: Diagnosis not present

## 2015-01-16 DIAGNOSIS — Z6824 Body mass index (BMI) 24.0-24.9, adult: Secondary | ICD-10-CM | POA: Diagnosis not present

## 2015-01-16 DIAGNOSIS — L500 Allergic urticaria: Secondary | ICD-10-CM | POA: Diagnosis not present

## 2015-01-16 DIAGNOSIS — Z23 Encounter for immunization: Secondary | ICD-10-CM | POA: Diagnosis not present

## 2015-01-16 DIAGNOSIS — Z1389 Encounter for screening for other disorder: Secondary | ICD-10-CM | POA: Diagnosis not present

## 2015-01-16 DIAGNOSIS — R972 Elevated prostate specific antigen [PSA]: Secondary | ICD-10-CM | POA: Diagnosis not present

## 2015-01-19 DIAGNOSIS — I5040 Unspecified combined systolic (congestive) and diastolic (congestive) heart failure: Secondary | ICD-10-CM | POA: Diagnosis not present

## 2015-01-23 DIAGNOSIS — I5040 Unspecified combined systolic (congestive) and diastolic (congestive) heart failure: Secondary | ICD-10-CM | POA: Diagnosis not present

## 2015-01-27 DIAGNOSIS — I5022 Chronic systolic (congestive) heart failure: Secondary | ICD-10-CM | POA: Diagnosis not present

## 2015-01-27 DIAGNOSIS — Z6825 Body mass index (BMI) 25.0-25.9, adult: Secondary | ICD-10-CM | POA: Diagnosis not present

## 2015-01-27 DIAGNOSIS — Z13820 Encounter for screening for osteoporosis: Secondary | ICD-10-CM | POA: Diagnosis not present

## 2015-01-27 DIAGNOSIS — E875 Hyperkalemia: Secondary | ICD-10-CM | POA: Diagnosis not present

## 2015-01-29 DIAGNOSIS — I5040 Unspecified combined systolic (congestive) and diastolic (congestive) heart failure: Secondary | ICD-10-CM | POA: Diagnosis not present

## 2015-01-30 DIAGNOSIS — I5040 Unspecified combined systolic (congestive) and diastolic (congestive) heart failure: Secondary | ICD-10-CM | POA: Diagnosis not present

## 2015-02-04 DIAGNOSIS — I5040 Unspecified combined systolic (congestive) and diastolic (congestive) heart failure: Secondary | ICD-10-CM | POA: Diagnosis not present

## 2015-02-12 DIAGNOSIS — I5040 Unspecified combined systolic (congestive) and diastolic (congestive) heart failure: Secondary | ICD-10-CM | POA: Diagnosis not present

## 2015-02-13 DIAGNOSIS — I5040 Unspecified combined systolic (congestive) and diastolic (congestive) heart failure: Secondary | ICD-10-CM | POA: Diagnosis not present

## 2015-02-16 DIAGNOSIS — I5040 Unspecified combined systolic (congestive) and diastolic (congestive) heart failure: Secondary | ICD-10-CM | POA: Diagnosis not present

## 2015-02-18 DIAGNOSIS — I5040 Unspecified combined systolic (congestive) and diastolic (congestive) heart failure: Secondary | ICD-10-CM | POA: Diagnosis not present

## 2015-02-20 DIAGNOSIS — C61 Malignant neoplasm of prostate: Secondary | ICD-10-CM | POA: Diagnosis not present

## 2015-02-25 DIAGNOSIS — I5040 Unspecified combined systolic (congestive) and diastolic (congestive) heart failure: Secondary | ICD-10-CM | POA: Diagnosis not present

## 2015-02-27 DIAGNOSIS — I5040 Unspecified combined systolic (congestive) and diastolic (congestive) heart failure: Secondary | ICD-10-CM | POA: Diagnosis not present

## 2015-03-03 ENCOUNTER — Ambulatory Visit (INDEPENDENT_AMBULATORY_CARE_PROVIDER_SITE_OTHER): Admitting: *Deleted

## 2015-03-03 ENCOUNTER — Encounter: Payer: Self-pay | Admitting: Internal Medicine

## 2015-03-03 DIAGNOSIS — Z951 Presence of aortocoronary bypass graft: Secondary | ICD-10-CM | POA: Diagnosis not present

## 2015-03-03 DIAGNOSIS — M1990 Unspecified osteoarthritis, unspecified site: Secondary | ICD-10-CM | POA: Diagnosis not present

## 2015-03-03 DIAGNOSIS — M1000 Idiopathic gout, unspecified site: Secondary | ICD-10-CM | POA: Diagnosis not present

## 2015-03-03 DIAGNOSIS — I482 Chronic atrial fibrillation: Secondary | ICD-10-CM | POA: Diagnosis not present

## 2015-03-03 DIAGNOSIS — I5022 Chronic systolic (congestive) heart failure: Secondary | ICD-10-CM | POA: Diagnosis not present

## 2015-03-03 DIAGNOSIS — D696 Thrombocytopenia, unspecified: Secondary | ICD-10-CM | POA: Diagnosis not present

## 2015-03-03 DIAGNOSIS — E785 Hyperlipidemia, unspecified: Secondary | ICD-10-CM | POA: Diagnosis not present

## 2015-03-03 DIAGNOSIS — I255 Ischemic cardiomyopathy: Secondary | ICD-10-CM | POA: Diagnosis not present

## 2015-03-03 DIAGNOSIS — I2510 Atherosclerotic heart disease of native coronary artery without angina pectoris: Secondary | ICD-10-CM | POA: Diagnosis not present

## 2015-03-03 DIAGNOSIS — I5020 Unspecified systolic (congestive) heart failure: Secondary | ICD-10-CM | POA: Diagnosis not present

## 2015-03-03 DIAGNOSIS — I10 Essential (primary) hypertension: Secondary | ICD-10-CM | POA: Diagnosis not present

## 2015-03-03 NOTE — Progress Notes (Signed)
Remote ICD transmission.   

## 2015-03-04 DIAGNOSIS — I5040 Unspecified combined systolic (congestive) and diastolic (congestive) heart failure: Secondary | ICD-10-CM | POA: Diagnosis not present

## 2015-03-05 LAB — CUP PACEART REMOTE DEVICE CHECK
Battery Voltage: 2.98 V
Brady Statistic AP VP Percent: 66.48 %
Brady Statistic AS VP Percent: 24.74 %
Brady Statistic AS VS Percent: 7.14 %
Brady Statistic RA Percent Paced: 68.12 %
Brady Statistic RV Percent Paced: 85.45 %
HighPow Impedance: 34 Ohm
HighPow Impedance: 42 Ohm
Lead Channel Impedance Value: 285 Ohm
Lead Channel Impedance Value: 304 Ohm
Lead Channel Impedance Value: 4047 Ohm
Lead Channel Impedance Value: 418 Ohm
Lead Channel Impedance Value: 513 Ohm
Lead Channel Pacing Threshold Amplitude: 0.75 V
Lead Channel Pacing Threshold Amplitude: 1.375 V
Lead Channel Pacing Threshold Pulse Width: 0.4 ms
Lead Channel Sensing Intrinsic Amplitude: 2.75 mV
Lead Channel Sensing Intrinsic Amplitude: 4.625 mV
Lead Channel Setting Pacing Amplitude: 1.5 V
Lead Channel Setting Pacing Pulse Width: 0.4 ms
MDC IDC MSMT BATTERY REMAINING LONGEVITY: 83 mo
MDC IDC MSMT LEADCHNL LV IMPEDANCE VALUE: 4047 Ohm
MDC IDC MSMT LEADCHNL LV PACING THRESHOLD PULSEWIDTH: 0.4 ms
MDC IDC MSMT LEADCHNL RA PACING THRESHOLD PULSEWIDTH: 0.4 ms
MDC IDC MSMT LEADCHNL RA SENSING INTR AMPL: 2.75 mV
MDC IDC MSMT LEADCHNL RV PACING THRESHOLD AMPLITUDE: 0.75 V
MDC IDC MSMT LEADCHNL RV SENSING INTR AMPL: 4.625 mV
MDC IDC SESS DTM: 20160628071705
MDC IDC SET LEADCHNL LV PACING AMPLITUDE: 2.5 V
MDC IDC SET LEADCHNL LV PACING PULSEWIDTH: 0.4 ms
MDC IDC SET LEADCHNL RV PACING AMPLITUDE: 2 V
MDC IDC SET LEADCHNL RV SENSING SENSITIVITY: 0.3 mV
MDC IDC SET ZONE DETECTION INTERVAL: 300 ms
MDC IDC SET ZONE DETECTION INTERVAL: 350 ms
MDC IDC SET ZONE DETECTION INTERVAL: 450 ms
MDC IDC STAT BRADY AP VS PERCENT: 1.64 %
Zone Setting Detection Interval: 350 ms

## 2015-03-06 DIAGNOSIS — I5040 Unspecified combined systolic (congestive) and diastolic (congestive) heart failure: Secondary | ICD-10-CM | POA: Diagnosis not present

## 2015-03-12 DIAGNOSIS — I5040 Unspecified combined systolic (congestive) and diastolic (congestive) heart failure: Secondary | ICD-10-CM | POA: Diagnosis not present

## 2015-03-13 DIAGNOSIS — I5020 Unspecified systolic (congestive) heart failure: Secondary | ICD-10-CM | POA: Diagnosis not present

## 2015-03-13 DIAGNOSIS — I420 Dilated cardiomyopathy: Secondary | ICD-10-CM | POA: Diagnosis not present

## 2015-03-13 DIAGNOSIS — I5040 Unspecified combined systolic (congestive) and diastolic (congestive) heart failure: Secondary | ICD-10-CM | POA: Diagnosis not present

## 2015-03-13 DIAGNOSIS — M1990 Unspecified osteoarthritis, unspecified site: Secondary | ICD-10-CM | POA: Diagnosis not present

## 2015-03-13 DIAGNOSIS — I2510 Atherosclerotic heart disease of native coronary artery without angina pectoris: Secondary | ICD-10-CM | POA: Diagnosis not present

## 2015-03-13 DIAGNOSIS — I10 Essential (primary) hypertension: Secondary | ICD-10-CM | POA: Diagnosis not present

## 2015-03-13 DIAGNOSIS — I482 Chronic atrial fibrillation: Secondary | ICD-10-CM | POA: Diagnosis not present

## 2015-03-13 DIAGNOSIS — E785 Hyperlipidemia, unspecified: Secondary | ICD-10-CM | POA: Diagnosis not present

## 2015-03-16 DIAGNOSIS — I5040 Unspecified combined systolic (congestive) and diastolic (congestive) heart failure: Secondary | ICD-10-CM | POA: Diagnosis not present

## 2015-03-20 DIAGNOSIS — I5040 Unspecified combined systolic (congestive) and diastolic (congestive) heart failure: Secondary | ICD-10-CM | POA: Diagnosis not present

## 2015-03-25 DIAGNOSIS — I5040 Unspecified combined systolic (congestive) and diastolic (congestive) heart failure: Secondary | ICD-10-CM | POA: Diagnosis not present

## 2015-03-27 DIAGNOSIS — I5040 Unspecified combined systolic (congestive) and diastolic (congestive) heart failure: Secondary | ICD-10-CM | POA: Diagnosis not present

## 2015-04-01 ENCOUNTER — Encounter: Payer: Self-pay | Admitting: *Deleted

## 2015-04-01 DIAGNOSIS — I5040 Unspecified combined systolic (congestive) and diastolic (congestive) heart failure: Secondary | ICD-10-CM | POA: Diagnosis not present

## 2015-04-03 DIAGNOSIS — I5040 Unspecified combined systolic (congestive) and diastolic (congestive) heart failure: Secondary | ICD-10-CM | POA: Diagnosis not present

## 2015-04-06 DIAGNOSIS — I5040 Unspecified combined systolic (congestive) and diastolic (congestive) heart failure: Secondary | ICD-10-CM | POA: Diagnosis not present

## 2015-04-07 DIAGNOSIS — I5040 Unspecified combined systolic (congestive) and diastolic (congestive) heart failure: Secondary | ICD-10-CM | POA: Diagnosis not present

## 2015-04-10 DIAGNOSIS — I5040 Unspecified combined systolic (congestive) and diastolic (congestive) heart failure: Secondary | ICD-10-CM | POA: Diagnosis not present

## 2015-04-12 DIAGNOSIS — I5040 Unspecified combined systolic (congestive) and diastolic (congestive) heart failure: Secondary | ICD-10-CM | POA: Diagnosis not present

## 2015-04-15 DIAGNOSIS — I5040 Unspecified combined systolic (congestive) and diastolic (congestive) heart failure: Secondary | ICD-10-CM | POA: Diagnosis not present

## 2015-04-17 DIAGNOSIS — I5040 Unspecified combined systolic (congestive) and diastolic (congestive) heart failure: Secondary | ICD-10-CM | POA: Diagnosis not present

## 2015-04-22 DIAGNOSIS — I5040 Unspecified combined systolic (congestive) and diastolic (congestive) heart failure: Secondary | ICD-10-CM | POA: Diagnosis not present

## 2015-04-24 DIAGNOSIS — I5040 Unspecified combined systolic (congestive) and diastolic (congestive) heart failure: Secondary | ICD-10-CM | POA: Diagnosis not present

## 2015-04-29 DIAGNOSIS — I5040 Unspecified combined systolic (congestive) and diastolic (congestive) heart failure: Secondary | ICD-10-CM | POA: Diagnosis not present

## 2015-04-30 DIAGNOSIS — H3412 Central retinal artery occlusion, left eye: Secondary | ICD-10-CM | POA: Diagnosis not present

## 2015-04-30 DIAGNOSIS — H04123 Dry eye syndrome of bilateral lacrimal glands: Secondary | ICD-10-CM | POA: Diagnosis not present

## 2015-05-01 DIAGNOSIS — I5040 Unspecified combined systolic (congestive) and diastolic (congestive) heart failure: Secondary | ICD-10-CM | POA: Diagnosis not present

## 2015-05-06 DIAGNOSIS — I5040 Unspecified combined systolic (congestive) and diastolic (congestive) heart failure: Secondary | ICD-10-CM | POA: Diagnosis not present

## 2015-05-07 DIAGNOSIS — I5040 Unspecified combined systolic (congestive) and diastolic (congestive) heart failure: Secondary | ICD-10-CM | POA: Diagnosis not present

## 2015-05-08 DIAGNOSIS — I5040 Unspecified combined systolic (congestive) and diastolic (congestive) heart failure: Secondary | ICD-10-CM | POA: Diagnosis not present

## 2015-05-10 DIAGNOSIS — I5040 Unspecified combined systolic (congestive) and diastolic (congestive) heart failure: Secondary | ICD-10-CM | POA: Diagnosis not present

## 2015-05-12 DIAGNOSIS — Z1212 Encounter for screening for malignant neoplasm of rectum: Secondary | ICD-10-CM | POA: Diagnosis not present

## 2015-05-12 LAB — HEMOCCULT SLIDES (X 3 CARDS)

## 2015-05-13 DIAGNOSIS — I5040 Unspecified combined systolic (congestive) and diastolic (congestive) heart failure: Secondary | ICD-10-CM | POA: Diagnosis not present

## 2015-05-15 DIAGNOSIS — K921 Melena: Secondary | ICD-10-CM | POA: Diagnosis not present

## 2015-05-15 DIAGNOSIS — Z6825 Body mass index (BMI) 25.0-25.9, adult: Secondary | ICD-10-CM | POA: Diagnosis not present

## 2015-05-15 DIAGNOSIS — I5040 Unspecified combined systolic (congestive) and diastolic (congestive) heart failure: Secondary | ICD-10-CM | POA: Diagnosis not present

## 2015-05-15 DIAGNOSIS — I4891 Unspecified atrial fibrillation: Secondary | ICD-10-CM | POA: Diagnosis not present

## 2015-05-18 ENCOUNTER — Encounter: Payer: Self-pay | Admitting: Internal Medicine

## 2015-05-20 DIAGNOSIS — I5040 Unspecified combined systolic (congestive) and diastolic (congestive) heart failure: Secondary | ICD-10-CM | POA: Diagnosis not present

## 2015-05-21 DIAGNOSIS — I4891 Unspecified atrial fibrillation: Secondary | ICD-10-CM | POA: Diagnosis not present

## 2015-05-21 DIAGNOSIS — K922 Gastrointestinal hemorrhage, unspecified: Secondary | ICD-10-CM | POA: Diagnosis not present

## 2015-05-21 DIAGNOSIS — D500 Iron deficiency anemia secondary to blood loss (chronic): Secondary | ICD-10-CM | POA: Diagnosis not present

## 2015-05-21 DIAGNOSIS — K921 Melena: Secondary | ICD-10-CM | POA: Diagnosis not present

## 2015-05-21 DIAGNOSIS — Z6826 Body mass index (BMI) 26.0-26.9, adult: Secondary | ICD-10-CM | POA: Diagnosis not present

## 2015-05-22 DIAGNOSIS — I5040 Unspecified combined systolic (congestive) and diastolic (congestive) heart failure: Secondary | ICD-10-CM | POA: Diagnosis not present

## 2015-05-27 DIAGNOSIS — I5040 Unspecified combined systolic (congestive) and diastolic (congestive) heart failure: Secondary | ICD-10-CM | POA: Diagnosis not present

## 2015-05-29 DIAGNOSIS — I5040 Unspecified combined systolic (congestive) and diastolic (congestive) heart failure: Secondary | ICD-10-CM | POA: Diagnosis not present

## 2015-06-02 ENCOUNTER — Ambulatory Visit (INDEPENDENT_AMBULATORY_CARE_PROVIDER_SITE_OTHER): Admitting: *Deleted

## 2015-06-02 ENCOUNTER — Encounter: Payer: Self-pay | Admitting: Internal Medicine

## 2015-06-02 DIAGNOSIS — I5022 Chronic systolic (congestive) heart failure: Secondary | ICD-10-CM

## 2015-06-02 DIAGNOSIS — I5040 Unspecified combined systolic (congestive) and diastolic (congestive) heart failure: Secondary | ICD-10-CM | POA: Diagnosis not present

## 2015-06-02 DIAGNOSIS — I255 Ischemic cardiomyopathy: Secondary | ICD-10-CM | POA: Diagnosis not present

## 2015-06-02 NOTE — Progress Notes (Signed)
Remote ICD transmission.   

## 2015-06-05 ENCOUNTER — Telehealth: Payer: Self-pay | Admitting: *Deleted

## 2015-06-05 DIAGNOSIS — I5040 Unspecified combined systolic (congestive) and diastolic (congestive) heart failure: Secondary | ICD-10-CM | POA: Diagnosis not present

## 2015-06-05 LAB — CUP PACEART REMOTE DEVICE CHECK
Battery Voltage: 2.98 V
Brady Statistic AP VS Percent: 1.81 %
Brady Statistic AS VP Percent: 21.43 %
Brady Statistic AS VS Percent: 4.96 %
Brady Statistic RA Percent Paced: 73.61 %
Brady Statistic RV Percent Paced: 85.16 %
Date Time Interrogation Session: 20160927062604
HighPow Impedance: 30 Ohm
HighPow Impedance: 41 Ohm
Lead Channel Impedance Value: 285 Ohm
Lead Channel Impedance Value: 4047 Ohm
Lead Channel Impedance Value: 418 Ohm
Lead Channel Pacing Threshold Amplitude: 0.625 V
Lead Channel Pacing Threshold Pulse Width: 0.4 ms
Lead Channel Pacing Threshold Pulse Width: 0.4 ms
Lead Channel Sensing Intrinsic Amplitude: 2.625 mV
Lead Channel Sensing Intrinsic Amplitude: 2.75 mV
Lead Channel Sensing Intrinsic Amplitude: 2.75 mV
Lead Channel Setting Pacing Pulse Width: 0.4 ms
Lead Channel Setting Sensing Sensitivity: 0.3 mV
MDC IDC MSMT BATTERY REMAINING LONGEVITY: 80 mo
MDC IDC MSMT LEADCHNL LV IMPEDANCE VALUE: 4047 Ohm
MDC IDC MSMT LEADCHNL LV PACING THRESHOLD AMPLITUDE: 1.375 V
MDC IDC MSMT LEADCHNL LV PACING THRESHOLD PULSEWIDTH: 0.4 ms
MDC IDC MSMT LEADCHNL RA IMPEDANCE VALUE: 475 Ohm
MDC IDC MSMT LEADCHNL RA PACING THRESHOLD AMPLITUDE: 0.75 V
MDC IDC MSMT LEADCHNL RA SENSING INTR AMPL: 2.625 mV
MDC IDC MSMT LEADCHNL RV IMPEDANCE VALUE: 285 Ohm
MDC IDC SET LEADCHNL LV PACING AMPLITUDE: 2.5 V
MDC IDC SET LEADCHNL LV PACING PULSEWIDTH: 0.4 ms
MDC IDC SET LEADCHNL RA PACING AMPLITUDE: 1.5 V
MDC IDC SET LEADCHNL RV PACING AMPLITUDE: 2 V
MDC IDC SET ZONE DETECTION INTERVAL: 450 ms
MDC IDC STAT BRADY AP VP PERCENT: 71.8 %
Zone Setting Detection Interval: 300 ms
Zone Setting Detection Interval: 350 ms
Zone Setting Detection Interval: 350 ms

## 2015-06-05 NOTE — Telephone Encounter (Signed)
Calling patient regarding abnormal thoracic impedence on remote transmission 06/02/15. Pt denies SOB, weight gain or LE edema. Will have Dr. [name] review.

## 2015-06-06 DIAGNOSIS — I5040 Unspecified combined systolic (congestive) and diastolic (congestive) heart failure: Secondary | ICD-10-CM | POA: Diagnosis not present

## 2015-06-08 DIAGNOSIS — I5040 Unspecified combined systolic (congestive) and diastolic (congestive) heart failure: Secondary | ICD-10-CM | POA: Diagnosis not present

## 2015-06-09 DIAGNOSIS — I5040 Unspecified combined systolic (congestive) and diastolic (congestive) heart failure: Secondary | ICD-10-CM | POA: Diagnosis not present

## 2015-06-10 DIAGNOSIS — I255 Ischemic cardiomyopathy: Secondary | ICD-10-CM | POA: Diagnosis not present

## 2015-06-10 DIAGNOSIS — M1000 Idiopathic gout, unspecified site: Secondary | ICD-10-CM | POA: Diagnosis not present

## 2015-06-10 DIAGNOSIS — D696 Thrombocytopenia, unspecified: Secondary | ICD-10-CM | POA: Diagnosis not present

## 2015-06-10 DIAGNOSIS — M1990 Unspecified osteoarthritis, unspecified site: Secondary | ICD-10-CM | POA: Diagnosis not present

## 2015-06-10 DIAGNOSIS — I482 Chronic atrial fibrillation: Secondary | ICD-10-CM | POA: Diagnosis not present

## 2015-06-10 DIAGNOSIS — I10 Essential (primary) hypertension: Secondary | ICD-10-CM | POA: Diagnosis not present

## 2015-06-10 DIAGNOSIS — E785 Hyperlipidemia, unspecified: Secondary | ICD-10-CM | POA: Diagnosis not present

## 2015-06-10 DIAGNOSIS — I2510 Atherosclerotic heart disease of native coronary artery without angina pectoris: Secondary | ICD-10-CM | POA: Diagnosis not present

## 2015-06-12 DIAGNOSIS — I5040 Unspecified combined systolic (congestive) and diastolic (congestive) heart failure: Secondary | ICD-10-CM | POA: Diagnosis not present

## 2015-06-15 DIAGNOSIS — I5040 Unspecified combined systolic (congestive) and diastolic (congestive) heart failure: Secondary | ICD-10-CM | POA: Diagnosis not present

## 2015-06-16 DIAGNOSIS — M542 Cervicalgia: Secondary | ICD-10-CM | POA: Diagnosis not present

## 2015-06-16 DIAGNOSIS — Z6827 Body mass index (BMI) 27.0-27.9, adult: Secondary | ICD-10-CM | POA: Diagnosis not present

## 2015-06-16 DIAGNOSIS — I5040 Unspecified combined systolic (congestive) and diastolic (congestive) heart failure: Secondary | ICD-10-CM | POA: Diagnosis not present

## 2015-06-17 ENCOUNTER — Encounter: Payer: Self-pay | Admitting: Cardiology

## 2015-06-17 DIAGNOSIS — I5040 Unspecified combined systolic (congestive) and diastolic (congestive) heart failure: Secondary | ICD-10-CM | POA: Diagnosis not present

## 2015-06-18 ENCOUNTER — Emergency Department (HOSPITAL_COMMUNITY)
Admission: EM | Admit: 2015-06-18 | Discharge: 2015-06-18 | Disposition: A | Discharge status: Home / Self Care | Payer: Medicare Other | Attending: Emergency Medicine | Admitting: Emergency Medicine

## 2015-06-18 ENCOUNTER — Emergency Department (HOSPITAL_COMMUNITY): Payer: Medicare Other

## 2015-06-18 ENCOUNTER — Encounter (HOSPITAL_COMMUNITY): Payer: Self-pay | Admitting: *Deleted

## 2015-06-18 DIAGNOSIS — Z9889 Other specified postprocedural states: Secondary | ICD-10-CM | POA: Diagnosis not present

## 2015-06-18 DIAGNOSIS — I5020 Unspecified systolic (congestive) heart failure: Secondary | ICD-10-CM | POA: Diagnosis not present

## 2015-06-18 DIAGNOSIS — Z9981 Dependence on supplemental oxygen: Secondary | ICD-10-CM | POA: Diagnosis not present

## 2015-06-18 DIAGNOSIS — Z95810 Presence of automatic (implantable) cardiac defibrillator: Secondary | ICD-10-CM | POA: Insufficient documentation

## 2015-06-18 DIAGNOSIS — M25511 Pain in right shoulder: Secondary | ICD-10-CM | POA: Diagnosis not present

## 2015-06-18 DIAGNOSIS — I10 Essential (primary) hypertension: Secondary | ICD-10-CM | POA: Insufficient documentation

## 2015-06-18 DIAGNOSIS — Z79899 Other long term (current) drug therapy: Secondary | ICD-10-CM | POA: Diagnosis not present

## 2015-06-18 DIAGNOSIS — R42 Dizziness and giddiness: Secondary | ICD-10-CM | POA: Diagnosis not present

## 2015-06-18 DIAGNOSIS — M1990 Unspecified osteoarthritis, unspecified site: Secondary | ICD-10-CM | POA: Insufficient documentation

## 2015-06-18 DIAGNOSIS — R404 Transient alteration of awareness: Secondary | ICD-10-CM | POA: Diagnosis not present

## 2015-06-18 DIAGNOSIS — Z9861 Coronary angioplasty status: Secondary | ICD-10-CM | POA: Diagnosis not present

## 2015-06-18 DIAGNOSIS — R51 Headache: Secondary | ICD-10-CM | POA: Insufficient documentation

## 2015-06-18 DIAGNOSIS — M25512 Pain in left shoulder: Secondary | ICD-10-CM | POA: Diagnosis not present

## 2015-06-18 DIAGNOSIS — K219 Gastro-esophageal reflux disease without esophagitis: Secondary | ICD-10-CM | POA: Insufficient documentation

## 2015-06-18 DIAGNOSIS — Z951 Presence of aortocoronary bypass graft: Secondary | ICD-10-CM | POA: Insufficient documentation

## 2015-06-18 DIAGNOSIS — E119 Type 2 diabetes mellitus without complications: Secondary | ICD-10-CM | POA: Insufficient documentation

## 2015-06-18 DIAGNOSIS — I2510 Atherosclerotic heart disease of native coronary artery without angina pectoris: Secondary | ICD-10-CM | POA: Insufficient documentation

## 2015-06-18 DIAGNOSIS — E785 Hyperlipidemia, unspecified: Secondary | ICD-10-CM | POA: Insufficient documentation

## 2015-06-18 DIAGNOSIS — M109 Gout, unspecified: Secondary | ICD-10-CM | POA: Insufficient documentation

## 2015-06-18 DIAGNOSIS — M542 Cervicalgia: Secondary | ICD-10-CM | POA: Diagnosis not present

## 2015-06-18 DIAGNOSIS — Z7982 Long term (current) use of aspirin: Secondary | ICD-10-CM | POA: Diagnosis not present

## 2015-06-18 LAB — I-STAT TROPONIN, ED
Troponin i, poc: 0.03 ng/mL (ref 0.00–0.08)
Troponin i, poc: 0.03 ng/mL (ref 0.00–0.08)

## 2015-06-18 LAB — CBC WITH DIFFERENTIAL/PLATELET
Basophils Absolute: 0 10*3/uL (ref 0.0–0.1)
Basophils Relative: 0 %
Eosinophils Absolute: 0.1 10*3/uL (ref 0.0–0.7)
Eosinophils Relative: 2 %
HEMATOCRIT: 29.6 % — AB (ref 39.0–52.0)
Hemoglobin: 10 g/dL — ABNORMAL LOW (ref 13.0–17.0)
Lymphocytes Relative: 7 %
Lymphs Abs: 0.3 10*3/uL — ABNORMAL LOW (ref 0.7–4.0)
MCH: 32.6 pg (ref 26.0–34.0)
MCHC: 33.8 g/dL (ref 30.0–36.0)
MCV: 96.4 fL (ref 78.0–100.0)
MONO ABS: 0.5 10*3/uL (ref 0.1–1.0)
MONOS PCT: 12 %
NEUTROS ABS: 3.5 10*3/uL (ref 1.7–7.7)
Neutrophils Relative %: 79 %
Platelets: 147 10*3/uL — ABNORMAL LOW (ref 150–400)
RBC: 3.07 MIL/uL — ABNORMAL LOW (ref 4.22–5.81)
RDW: 15.4 % (ref 11.5–15.5)
WBC: 4.5 10*3/uL (ref 4.0–10.5)

## 2015-06-18 LAB — COMPREHENSIVE METABOLIC PANEL
ALK PHOS: 70 U/L (ref 38–126)
ALT: 16 U/L — AB (ref 17–63)
AST: 42 U/L — AB (ref 15–41)
Albumin: 4 g/dL (ref 3.5–5.0)
Anion gap: 8 (ref 5–15)
BUN: 19 mg/dL (ref 6–20)
CHLORIDE: 111 mmol/L (ref 101–111)
CO2: 22 mmol/L (ref 22–32)
CREATININE: 1.2 mg/dL (ref 0.61–1.24)
Calcium: 9.4 mg/dL (ref 8.9–10.3)
GFR calc Af Amer: >60 mL/min (ref >60)
GFR, EST NON AFRICAN AMERICAN: 53 mL/min — AB (ref >60)
Glucose, Bld: 126 mg/dL — ABNORMAL HIGH (ref 65–99)
Potassium: 3.7 mmol/L (ref 3.5–5.1)
SODIUM: 141 mmol/L (ref 135–145)
Total Bilirubin: 1.2 mg/dL (ref 0.3–1.2)
Total Protein: 6.9 g/dL (ref 6.5–8.1)

## 2015-06-18 MED ORDER — TIZANIDINE HCL 2 MG PO TABS: 2.0000 mg | ORAL_TABLET | Freq: Three times a day (TID) | ORAL | Status: DC | PRN

## 2015-06-18 MED ORDER — HYDROMORPHONE HCL 1 MG/ML IJ SOLN
1.0000 mg | Freq: Once | INTRAMUSCULAR | Status: AC
  Administered 2015-06-18: 1 mg via INTRAMUSCULAR
  Filled 2015-06-18: qty 1

## 2015-06-18 MED ORDER — INFLUENZA VAC SPLIT QUAD 0.5 ML IM SUSY: 0.5000 mL | PREFILLED_SYRINGE | INTRAMUSCULAR | Status: DC

## 2015-06-18 MED ORDER — DIAZEPAM 5 MG PO TABS
5.0000 mg | ORAL_TABLET | Freq: Once | ORAL | Status: AC
  Administered 2015-06-18: 5 mg via ORAL
  Filled 2015-06-18: qty 1

## 2015-06-18 MED ORDER — CYCLOBENZAPRINE HCL 10 MG PO TABS
10.0000 mg | ORAL_TABLET | Freq: Once | ORAL | Status: AC
  Administered 2015-06-18: 10 mg via ORAL
  Filled 2015-06-18: qty 1

## 2015-06-18 NOTE — ED Notes (Signed)
Delay on istat troponin, admitting nurse in with pt

## 2015-06-18 NOTE — ED Notes (Addendum)
Pt and emergency contact/ POA verbalized understanding discharge instructions. In no acute distress.

## 2015-06-18 NOTE — ED Notes (Signed)
edp wanted to proceed with drawing istat

## 2015-06-18 NOTE — ED Notes (Signed)
[name] called asking for update on pt, pt spoke with her on the phone and gave verbal agreement to rn that rn could discuss medical information with Izola Price.

## 2015-06-18 NOTE — ED Notes (Signed)
Bed: OE69 Expected date:  Expected time:  Means of arrival:  Comments: Ems-81yo pain

## 2015-06-18 NOTE — ED Notes (Addendum)
Pt is in hospice, cannot give reason. Pt reports left shoulder pain and neck pain, radiates to head x2 weeks. Has seen pcp for pain, is taking percocet, was told he "had a stiff neck".   Upon rn assessment pt a&o x4. Pt has pacemaker, at times HR in 40's. Pt unsure what brand pacemaker he has. From notes in 2015 it appears pt has [employer] pacemaker. Attempting to interrogate without success, will call help like. Pt reports left shoulder pain and neck pain 10/10.   I called [employer], pt does have their pacemaker. ICD Model  [id number],  Serial [id number]

## 2015-06-18 NOTE — ED Notes (Signed)
[facility] called  

## 2015-06-18 NOTE — ED Notes (Signed)
Per edp Schlossman cancel the istat troponin

## 2015-06-18 NOTE — ED Notes (Signed)
POA and pt requesting rn talk with hospice nurse, rn gave hospice nurse [facility] ED mail line, hospice nurse called, rn gave update.

## 2015-06-18 NOTE — Discharge Instructions (Signed)
Cervical Strain and Sprain With Rehab  Cervical strain and sprain are injuries that commonly occur with "whiplash" injuries. Whiplash occurs when the neck is forcefully whipped backward or forward, such as during a motor vehicle accident or during contact sports. The muscles, ligaments, tendons, discs, and nerves of the neck are susceptible to injury when this occurs.  RISK FACTORS  Risk of having a whiplash injury increases if:  · Osteoarthritis of the spine.  · Situations that make head or neck accidents or trauma more likely.  · High-risk sports (football, rugby, wrestling, hockey, auto racing, gymnastics, diving, contact karate, or boxing).  · Poor strength and flexibility of the neck.  · Previous neck injury.  · Poor tackling technique.  · Improperly fitted or padded equipment.  SYMPTOMS   · Pain or stiffness in the front or back of neck or both.  · Symptoms may present immediately or up to 24 hours after injury.  · Dizziness, headache, nausea, and vomiting.  · Muscle spasm with soreness and stiffness in the neck.  · Tenderness and swelling at the injury site.  PREVENTION  · Learn and use proper technique (avoid tackling with the head, spearing, and head-butting; use proper falling techniques to avoid landing on the head).  · Warm up and stretch properly before activity.  · Maintain physical fitness:    Strength, flexibility, and endurance.    Cardiovascular fitness.  · Wear properly fitted and padded protective equipment, such as padded soft collars, for participation in contact sports.  PROGNOSIS   Recovery from cervical strain and sprain injuries is dependent on the extent of the injury. These injuries are usually curable in 1 week to 3 months with appropriate treatment.   RELATED COMPLICATIONS   · Temporary numbness and weakness may occur if the nerve roots are damaged, and this may persist until the nerve has completely healed.  · Chronic pain due to frequent recurrence of symptoms.  · Prolonged healing,  especially if activity is resumed too soon (before complete recovery).  TREATMENT   Treatment initially involves the use of ice and medication to help reduce pain and inflammation. It is also important to perform strengthening and stretching exercises and modify activities that worsen symptoms so the injury does not get worse. These exercises may be performed at home or with a therapist. For patients who experience severe symptoms, a soft, padded collar may be recommended to be worn around the neck.   Improving your posture may help reduce symptoms. Posture improvement includes pulling your chin and abdomen in while sitting or standing. If you are sitting, sit in a firm chair with your buttocks against the back of the chair. While sleeping, try replacing your pillow with a small towel rolled to 2 inches in diameter, or use a cervical pillow or soft cervical collar. Poor sleeping positions delay healing.   For patients with nerve root damage, which causes numbness or weakness, the use of a cervical traction apparatus may be recommended. Surgery is rarely necessary for these injuries. However, cervical strain and sprains that are present at birth (congenital) may require surgery.  MEDICATION   · If pain medication is necessary, nonsteroidal anti-inflammatory medications, such as aspirin and ibuprofen, or other minor pain relievers, such as acetaminophen, are often recommended.  · Do not take pain medication for 7 days before surgery.  · Prescription pain relievers may be given if deemed necessary by your caregiver. Use only as directed and only as much as you need.    HEAT AND COLD:   · Cold treatment (icing) relieves pain and reduces inflammation. Cold treatment should be applied for 10 to 15 minutes every 2 to 3 hours for inflammation and pain and immediately after any activity that aggravates your symptoms. Use ice packs or an ice massage.  · Heat treatment may be used prior to performing the stretching and  strengthening activities prescribed by your caregiver, physical therapist, or athletic trainer. Use a heat pack or a warm soak.  SEEK MEDICAL CARE IF:   · Symptoms get worse or do not improve in 2 weeks despite treatment.  · New, unexplained symptoms develop (drugs used in treatment may produce side effects).  EXERCISES  RANGE OF MOTION (ROM) AND STRETCHING EXERCISES - Cervical Strain and Sprain  These exercises may help you when beginning to rehabilitate your injury. In order to successfully resolve your symptoms, you must improve your posture. These exercises are designed to help reduce the forward-head and rounded-shoulder posture which contributes to this condition. Your symptoms may resolve with or without further involvement from your physician, physical therapist or athletic trainer. While completing these exercises, remember:   · Restoring tissue flexibility helps normal motion to return to the joints. This allows healthier, less painful movement and activity.  · An effective stretch should be held for at least 20 seconds, although you may need to begin with shorter hold times for comfort.  · A stretch should never be painful. You should only feel a gentle lengthening or release in the stretched tissue.  STRETCH- Axial Extensors  · Lie on your back on the floor. You may bend your knees for comfort. Place a rolled-up hand towel or dish towel, about 2 inches in diameter, under the part of your head that makes contact with the floor.  · Gently tuck your chin, as if trying to make a "double chin," until you feel a gentle stretch at the base of your head.  · Hold __________ seconds.  Repeat __________ times. Complete this exercise __________ times per day.   STRETCH - Axial Extension   · Stand or sit on a firm surface. Assume a good posture: chest up, shoulders drawn back, abdominal muscles slightly tense, knees unlocked (if standing) and feet hip width apart.  · Slowly retract your chin so your head slides back  and your chin slightly lowers. Continue to look straight ahead.  · You should feel a gentle stretch in the back of your head. Be certain not to feel an aggressive stretch since this can cause headaches later.  · Hold for __________ seconds.  Repeat __________ times. Complete this exercise __________ times per day.  STRETCH - Cervical Side Bend   · Stand or sit on a firm surface. Assume a good posture: chest up, shoulders drawn back, abdominal muscles slightly tense, knees unlocked (if standing) and feet hip width apart.  · Without letting your nose or shoulders move, slowly tip your right / left ear to your shoulder until your feel a gentle stretch in the muscles on the opposite side of your neck.  · Hold __________ seconds.  Repeat __________ times. Complete this exercise __________ times per day.  STRETCH - Cervical Rotators   · Stand or sit on a firm surface. Assume a good posture: chest up, shoulders drawn back, abdominal muscles slightly tense, knees unlocked (if standing) and feet hip width apart.  · Keeping your eyes level with the ground, slowly turn your head until you feel a gentle stretch along   the back and opposite side of your neck.  · Hold __________ seconds.  Repeat __________ times. Complete this exercise __________ times per day.  RANGE OF MOTION - Neck Circles   · Stand or sit on a firm surface. Assume a good posture: chest up, shoulders drawn back, abdominal muscles slightly tense, knees unlocked (if standing) and feet hip width apart.  · Gently roll your head down and around from the back of one shoulder to the back of the other. The motion should never be forced or painful.  · Repeat the motion 10-20 times, or until you feel the neck muscles relax and loosen.  Repeat __________ times. Complete the exercise __________ times per day.  STRENGTHENING EXERCISES - Cervical Strain and Sprain  These exercises may help you when beginning to rehabilitate your injury. They may resolve your symptoms with or  without further involvement from your physician, physical therapist, or athletic trainer. While completing these exercises, remember:   · Muscles can gain both the endurance and the strength needed for everyday activities through controlled exercises.  · Complete these exercises as instructed by your physician, physical therapist, or athletic trainer. Progress the resistance and repetitions only as guided.  · You may experience muscle soreness or fatigue, but the pain or discomfort you are trying to eliminate should never worsen during these exercises. If this pain does worsen, stop and make certain you are following the directions exactly. If the pain is still present after adjustments, discontinue the exercise until you can discuss the trouble with your clinician.  STRENGTH - Cervical Flexors, Isometric  · Face a wall, standing about 6 inches away. Place a small pillow, a ball about 6-8 inches in diameter, or a folded towel between your forehead and the wall.  · Slightly tuck your chin and gently push your forehead into the soft object. Push only with mild to moderate intensity, building up tension gradually. Keep your jaw and forehead relaxed.  · Hold 10 to 20 seconds. Keep your breathing relaxed.  · Release the tension slowly. Relax your neck muscles completely before you start the next repetition.  Repeat __________ times. Complete this exercise __________ times per day.  STRENGTH- Cervical Lateral Flexors, Isometric   · Stand about 6 inches away from a wall. Place a small pillow, a ball about 6-8 inches in diameter, or a folded towel between the side of your head and the wall.  · Slightly tuck your chin and gently tilt your head into the soft object. Push only with mild to moderate intensity, building up tension gradually. Keep your jaw and forehead relaxed.  · Hold 10 to 20 seconds. Keep your breathing relaxed.  · Release the tension slowly. Relax your neck muscles completely before you start the next  repetition.  Repeat __________ times. Complete this exercise __________ times per day.  STRENGTH - Cervical Extensors, Isometric   · Stand about 6 inches away from a wall. Place a small pillow, a ball about 6-8 inches in diameter, or a folded towel between the back of your head and the wall.  · Slightly tuck your chin and gently tilt your head back into the soft object. Push only with mild to moderate intensity, building up tension gradually. Keep your jaw and forehead relaxed.  · Hold 10 to 20 seconds. Keep your breathing relaxed.  · Release the tension slowly. Relax your neck muscles completely before you start the next repetition.  Repeat __________ times. Complete this exercise __________ times per day.    POSTURE AND BODY MECHANICS CONSIDERATIONS - Cervical Strain and Sprain  Keeping correct posture when sitting, standing or completing your activities will reduce the stress put on different body tissues, allowing injured tissues a chance to heal and limiting painful experiences. The following are general guidelines for improved posture. Your physician or physical therapist will provide you with any instructions specific to your needs. While reading these guidelines, remember:  · The exercises prescribed by your provider will help you have the flexibility and strength to maintain correct postures.  · The correct posture provides the optimal environment for your joints to work. All of your joints have less wear and tear when properly supported by a spine with good posture. This means you will experience a healthier, less painful body.  · Correct posture must be practiced with all of your activities, especially prolonged sitting and standing. Correct posture is as important when doing repetitive low-stress activities (typing) as it is when doing a single heavy-load activity (lifting).  PROLONGED STANDING WHILE SLIGHTLY LEANING FORWARD  When completing a task that requires you to lean forward while standing in one  place for a long time, place either foot up on a stationary 2- to 4-inch high object to help maintain the best posture. When both feet are on the ground, the low back tends to lose its slight inward curve. If this curve flattens (or becomes too large), then the back and your other joints will experience too much stress, fatigue more quickly, and can cause pain.   RESTING POSITIONS  Consider which positions are most painful for you when choosing a resting position. If you have pain with flexion-based activities (sitting, bending, stooping, squatting), choose a position that allows you to rest in a less flexed posture. You would want to avoid curling into a fetal position on your side. If your pain worsens with extension-based activities (prolonged standing, working overhead), avoid resting in an extended position such as sleeping on your stomach. Most people will find more comfort when they rest with their spine in a more neutral position, neither too rounded nor too arched. Lying on a non-sagging bed on your side with a pillow between your knees, or on your back with a pillow under your knees will often provide some relief. Keep in mind, being in any one position for a prolonged period of time, no matter how correct your posture, can still lead to stiffness.  WALKING  Walk with an upright posture. Your ears, shoulders, and hips should all line up.  OFFICE WORK  When working at a desk, create an environment that supports good, upright posture. Without extra support, muscles fatigue and lead to excessive strain on joints and other tissues.  CHAIR:  · A chair should be able to slide under your desk when your back makes contact with the back of the chair. This allows you to work closely.  · The chair's height should allow your eyes to be level with the upper part of your monitor and your hands to be slightly lower than your elbows.  · Body position:    Your feet should make contact with the floor. If this is not  possible, use a foot rest.    Keep your ears over your shoulders. This will reduce stress on your neck and low back.     This information is not intended to replace advice given to you by your health care provider. Make sure you discuss any questions you have with your health care provider.

## 2015-06-18 NOTE — ED Notes (Signed)
rn called pts ride again, ride said he had not received a call earlier,  [phone], ride said he would be here in an hour.

## 2015-06-18 NOTE — ED Notes (Signed)
Taxi came to pick pt up.

## 2015-06-18 NOTE — ED Notes (Addendum)
[employer] tech called reports - ICD is normal. optivol since 9/27 is trending up, which is related to fluid accumulation in the lungs. Pacing percent 9/27 was 85.2%, today 10/13 pacing percent 87.9%. Rate set at 60 beats.   Per cardiology PA, pacemaker is working fine, impendence is up. Requesting RN alert Dr [name]. Harwani made aware and requests pt be seen in office for follow up.

## 2015-06-18 NOTE — ED Provider Notes (Signed)
CSN: [id number]     Arrival date & time 06/18/15  1016 History   First MD Initiated Contact with Patient 06/18/15 1023     Chief Complaint  Patient presents with  . Shoulder Pain      (Consider location/radiation/quality/duration/timing/severity/associated sxs/prior Treatment) Patient is a 81 y.o. male presenting with neck injury.  Neck Injury This is a new problem. Episode onset: 2 weeks ago. The problem occurs constantly. The problem has been gradually worsening. Associated symptoms include headaches. Pertinent negatives include no chest pain, no abdominal pain and no shortness of breath. The symptoms are aggravated by twisting (moving). Nothing relieves the symptoms. Treatments tried: percocet. The treatment provided no relief.    Past Medical History  Diagnosis Date  . CAD (coronary artery disease)     s/p CABG; s/p Pacemaker  . Systolic CHF with reduced left ventricular function, NYHA class 2 ([place])   . Ischemic cardiomyopathy     s/p ICD  . Hypertension   . Hyperlipidemia   . Gout   . Paroxysmal ventricular tachycardia ([place])   . Diverticulosis of colon   . AICD (automatic cardioverter/defibrillator) present   . Arthritis     "shoulders" (10/31/2014)  . On home oxygen therapy     "2L prn" (10/31/2014)  . Shortness of breath dyspnea     with exertion  . GERD (gastroesophageal reflux disease)   . SBO (small bowel obstruction) ([place]) 10/31/2014   Past Surgical History  Procedure Laterality Date  . Coronary artery bypass graft  03/2003    CABG X3/notes 01/18/2011  . Bowel resection    . Coronary angioplasty with stent placement  04/2011    2 stents/notes 05/03/2011  . Implantable cardioverter defibrillator (icd) generator change Left 10/12/2011    Procedure: ICD GENERATOR CHANGE;  Surgeon: Evans Lance, MD;  Location: [facility] CATH LAB;  Service: Cardiovascular;  Laterality: Left;  . Pacemaker revision N/A 10/12/2011    Procedure: PACEMAKER REVISION;  Surgeon: Evans Lance, MD;   Location: [facility] CATH LAB;  Service: Cardiovascular;  Laterality: N/A;  . Bi-ventricular implantable cardioverter defibrillator upgrade N/A 01/22/2014    Procedure: BI-VENTRICULAR IMPLANTABLE CARDIOVERTER DEFIBRILLATOR UPGRADE;  Surgeon: Evans Lance, MD;  Location: [facility] CATH LAB;  Service: Cardiovascular;  Laterality: N/A;  . Tonsillectomy    . Cholecystectomy    . Cataract extraction w/ intraocular lens  implant, bilateral Bilateral   . Tee with cardioversion  03/2003    Archie Endo 01/18/2011  . Cardiac defibrillator placement  07/2003    Archie Endo 01/18/2011  . Cardiac catheterization  03/2011    Archie Endo 01/18/2011  . Eye surgery Bilateral     [place]  . Cataract extraction w/phaco Right 12/17/2014    Procedure: PHACOEMULSIFICATION CATARACT EXTRACTION WITH IOL IMPLANT RIGHT EYE;  Surgeon: Marylynn Pearson, MD;  Location: [place];  Service: Ophthalmology;  Laterality: Right;  . Pacemaker placement     History reviewed. No pertinent family history. Social History  Substance Use Topics  . Smoking status: Never Smoker   . Smokeless tobacco: Former Systems developer    Types: Chew     Comment: no chew in over 3 years  . Alcohol Use: Yes     Comment: "might take a drink during the holidays"    Review of Systems  Constitutional: Negative for fever.  HENT: Negative for sore throat.   Eyes: Negative for visual disturbance.  Respiratory: Negative for shortness of breath.   Cardiovascular: Negative for chest pain.  Gastrointestinal: Negative for nausea, vomiting, abdominal  pain, diarrhea and constipation.  Genitourinary: Negative for difficulty urinating.  Musculoskeletal: Positive for arthralgias and neck pain. Negative for back pain and neck stiffness.  Skin: Negative for rash.  Neurological: Positive for headaches. Negative for syncope.      Allergies  Review of patient's allergies indicates no known allergies.  Home Medications   Prior to Admission medications   Medication Sig Start Date End Date Taking?  Authorizing Provider  allopurinol (ZYLOPRIM) 300 MG tablet Take 300 mg by mouth daily.     Yes Historical Provider, MD  atorvastatin (LIPITOR) 20 MG tablet Take 20 mg by mouth daily.     Yes Historical Provider, MD  carvedilol (COREG) 6.25 MG tablet Take 3.125 mg by mouth 2 (two) times daily.    Yes Historical Provider, MD  cycloSPORINE (RESTASIS) 0.05 % ophthalmic emulsion Place 1 drop into both eyes daily.   Yes Historical Provider, MD  diphenhydrAMINE (SOMINEX) 25 MG tablet Take 25 mg by mouth 2 (two) times daily as needed for allergies or sleep.   Yes Historical Provider, MD  escitalopram (LEXAPRO) 10 MG tablet Take 10 mg by mouth daily.  07/11/14  Yes Historical Provider, MD  esomeprazole (NEXIUM) 20 MG capsule Take 20 mg by mouth daily at 12 noon.   Yes Historical Provider, MD  famotidine (PEPCID) 20 MG tablet Take 20 mg by mouth daily.    Yes Historical Provider, MD  FeBisg-FeCbn-C-FA-B12-Biot-DSS (FERIVA PO) Take 1 tablet by mouth daily.   Yes Historical Provider, MD  HYDROcodone-acetaminophen (NORCO/VICODIN) 5-325 MG tablet Take 1-2 tablets by mouth every 6 (six) hours as needed for moderate pain.   Yes Historical Provider, MD  loperamide (IMODIUM A-D) 2 MG tablet Take 2 mg by mouth every 6 (six) hours as needed for diarrhea or loose stools.   Yes Historical Provider, MD  LORazepam (ATIVAN) 0.5 MG tablet Take 0.5 mg by mouth 2 (two) times daily.   Yes Historical Provider, MD  Misc Natural Products (PROSTATE HEALTH PO) Take 1 tablet by mouth daily.   Yes Historical Provider, MD  Multiple Vitamins-Minerals (CENTRUM SILVER ULTRA MENS PO) Take 1 tablet by mouth daily.   Yes Historical Provider, MD  NITROSTAT 0.4 MG SL tablet Place 0.4 mg under the tongue every 5 (five) minutes as needed for chest pain (MAX 3 TABLETS).  12/05/12  Yes Historical Provider, MD  PATADAY 0.2 % SOLN Place 1 drop into both eyes daily as needed (irritation).  01/14/14  Yes Historical Provider, MD  RA ASPIRIN ADULT LOW  STRENGTH 81 MG EC tablet Take 1 tablet by mouth daily. 09/01/14  Yes Historical Provider, MD  ramipril (ALTACE) 2.5 MG capsule Take 2.5 mg by mouth daily.   Yes Historical Provider, MD  sucralfate (CARAFATE) 1 G tablet Take 1 g by mouth 2 (two) times daily.    Yes Historical Provider, MD  docusate sodium (COLACE) 100 MG capsule Take 1 capsule (100 mg total) by mouth every 12 (twelve) hours. Patient not taking: Reported on 06/18/2015 01/07/14   Hazel Sams, PA-C  Rivaroxaban (XARELTO) 15 MG TABS tablet Take 1 tablet (15 mg total) by mouth daily. Patient not taking: Reported on 06/18/2015 07/30/14   Charolette Forward, MD  spironolactone (ALDACTONE) 12.5 mg TABS tablet Take 0.5 tablets (12.5 mg total) by mouth daily. Patient not taking: Reported on 06/18/2015 07/30/14   Charolette Forward, MD  tiZANidine (ZANAFLEX) 2 MG tablet Take 1 tablet (2 mg total) by mouth every 8 (eight) hours as needed for muscle spasms. 06/18/15  Gareth Morgan, MD   BP 156/74 mmHg  Pulse 72  Temp(Src) 98.1 F (36.7 C) (Oral)  Resp 17  SpO2 100% Physical Exam  Constitutional: He is oriented to person, place, and time. He appears well-developed and well-nourished. No distress.  HENT:  Head: Normocephalic and atraumatic.  Eyes: Conjunctivae and EOM are normal.  Neck: Normal range of motion.  Cardiovascular: Normal rate, regular rhythm, normal heart sounds and intact distal pulses.  Exam reveals no gallop and no friction rub.   No murmur heard. Pulmonary/Chest: Effort normal and breath sounds normal. No respiratory distress. He has no wheezes. He has no rales.  Abdominal: Soft. He exhibits no distension. There is no tenderness. There is no guarding.  Midline scar, hernia  Musculoskeletal: He exhibits no edema.       Right shoulder: He exhibits decreased range of motion.       Cervical back: He exhibits decreased range of motion and tenderness. He exhibits no bony tenderness.  Neurological: He is alert and oriented to  person, place, and time.  Skin: Skin is warm and dry. He is not diaphoretic.  Nursing note and vitals reviewed.   ED Course  Procedures (including critical care time) Labs Review Labs Reviewed  CBC WITH DIFFERENTIAL/PLATELET - Abnormal; Notable for the following:    RBC 3.07 (*)    Hemoglobin 10.0 (*)    HCT 29.6 (*)    Platelets 147 (*)    Lymphs Abs 0.3 (*)    All other components within normal limits  COMPREHENSIVE METABOLIC PANEL - Abnormal; Notable for the following:    Glucose, Bld 126 (*)    AST 42 (*)    ALT 16 (*)    GFR calc non Af Amer 53 (*)    All other components within normal limits  I-STAT TROPOININ, ED  I-STAT TROPOININ, ED  I-STAT TROPOININ, ED    Imaging Review Dg Chest 2 View  06/18/2015  CLINICAL DATA:  Right shoulder and neck pain EXAM: CHEST  2 VIEW COMPARISON:  10/31/2014 FINDINGS: Chronic cardiopericardial enlargement. ICD/pacer leads from the left, with 2 right ventricular leads and a direct epicardial lead on the left in stable position. Unchanged aortic and hilar contours. Obscured right base, likely atelectasis given bandlike opacity below the right minor fissure. Small pleural effusions. No edema or pneumothorax. Severe right glenohumeral osteoarthritis with bilateral high humeral head, consistent with chronic rotator cuff tear. IMPRESSION: Trace effusions with mild right basilar atelectasis. Electronically Signed   By: Monte Fantasia M.D.   On: 06/18/2015 12:09   I have personally reviewed and evaluated these images and lab results as part of my medical decision-making.   EKG Interpretation   Date/Time:  Thursday June 18 2015 10:29:08 EDT Ventricular Rate:  77 PR Interval:    QRS Duration: 162 QT Interval:  490 QTC Calculation: 555 R Axis:   113 Text Interpretation:  Pacemaker spikes or artifacts Atrial fibrillation  Paired ventricular premature complexes RBBB and LPFB Repol abnrm suggests  ischemia, lateral leads Baseline wander in  lead(s) II III aVF Confirmed by  [facility] MD, Taryll Reichenberger ([id number]) on 06/18/2015 10:56:02 PM      MDM   Final diagnoses:  Musculoskeletal neck pain   81yo male with history of CAD, CHF, defibrillator, DM, hx of SBO, presents with concern for right sided neck pain radiating to the head for 2 weeks.  Given slow onset and duration of symptoms doubt SAH. No hx of trauma and doubt fx.  Doubt ACS  given negative troponins.   Pain most likely muscular strain by history and exam, pain significantly worse with movements.  Concern for pacer dysfunction with unconducted pacer beats versus low voltage and bradycardia to 40s during triage.  Pacemaker interrogated and cardiology consulted who felt there was no significant pacer dysfunction. XR showed mild effusions, however pt without hypoxia, no increased SOB from baseline and no signs of CHF exacerbation.  Will have patient follow up with cardiology as outpatient.  Pt given flexeril, valium, and dilaudid with improvement in pain. Recommend continued PCP follow up for neck pain, and follow up with cardiologist regarding pacemaker.       Gareth Morgan, MD 06/18/15 2300

## 2015-06-19 DIAGNOSIS — I5040 Unspecified combined systolic (congestive) and diastolic (congestive) heart failure: Secondary | ICD-10-CM | POA: Diagnosis not present

## 2015-06-22 DIAGNOSIS — E785 Hyperlipidemia, unspecified: Secondary | ICD-10-CM | POA: Diagnosis not present

## 2015-06-22 DIAGNOSIS — M1990 Unspecified osteoarthritis, unspecified site: Secondary | ICD-10-CM | POA: Diagnosis not present

## 2015-06-22 DIAGNOSIS — I482 Chronic atrial fibrillation: Secondary | ICD-10-CM | POA: Diagnosis not present

## 2015-06-22 DIAGNOSIS — I10 Essential (primary) hypertension: Secondary | ICD-10-CM | POA: Diagnosis not present

## 2015-06-22 DIAGNOSIS — I2510 Atherosclerotic heart disease of native coronary artery without angina pectoris: Secondary | ICD-10-CM | POA: Diagnosis not present

## 2015-06-22 DIAGNOSIS — I255 Ischemic cardiomyopathy: Secondary | ICD-10-CM | POA: Diagnosis not present

## 2015-06-22 DIAGNOSIS — I5040 Unspecified combined systolic (congestive) and diastolic (congestive) heart failure: Secondary | ICD-10-CM | POA: Diagnosis not present

## 2015-06-22 DIAGNOSIS — D696 Thrombocytopenia, unspecified: Secondary | ICD-10-CM | POA: Diagnosis not present

## 2015-06-22 DIAGNOSIS — M1000 Idiopathic gout, unspecified site: Secondary | ICD-10-CM | POA: Diagnosis not present

## 2015-06-24 ENCOUNTER — Emergency Department (HOSPITAL_COMMUNITY): Payer: Medicare Other

## 2015-06-24 ENCOUNTER — Encounter (HOSPITAL_COMMUNITY): Payer: Self-pay | Admitting: Emergency Medicine

## 2015-06-24 ENCOUNTER — Observation Stay (HOSPITAL_COMMUNITY)
Admission: EM | Admit: 2015-06-24 | Discharge: 2015-06-28 | Disposition: A | Discharge status: Skilled Nursing Facility | Payer: Medicare Other | Attending: Internal Medicine | Admitting: Internal Medicine

## 2015-06-24 DIAGNOSIS — I2510 Atherosclerotic heart disease of native coronary artery without angina pectoris: Secondary | ICD-10-CM | POA: Diagnosis not present

## 2015-06-24 DIAGNOSIS — D696 Thrombocytopenia, unspecified: Secondary | ICD-10-CM | POA: Insufficient documentation

## 2015-06-24 DIAGNOSIS — I472 Ventricular tachycardia, unspecified: Secondary | ICD-10-CM | POA: Insufficient documentation

## 2015-06-24 DIAGNOSIS — S0181XA Laceration without foreign body of other part of head, initial encounter: Secondary | ICD-10-CM | POA: Diagnosis not present

## 2015-06-24 DIAGNOSIS — Z951 Presence of aortocoronary bypass graft: Secondary | ICD-10-CM | POA: Insufficient documentation

## 2015-06-24 DIAGNOSIS — D649 Anemia, unspecified: Secondary | ICD-10-CM | POA: Insufficient documentation

## 2015-06-24 DIAGNOSIS — K2100 Gastro-esophageal reflux disease with esophagitis, without bleeding: Secondary | ICD-10-CM

## 2015-06-24 DIAGNOSIS — E119 Type 2 diabetes mellitus without complications: Secondary | ICD-10-CM | POA: Insufficient documentation

## 2015-06-24 DIAGNOSIS — I4892 Unspecified atrial flutter: Secondary | ICD-10-CM | POA: Insufficient documentation

## 2015-06-24 DIAGNOSIS — S12030A Displaced posterior arch fracture of first cervical vertebra, initial encounter for closed fracture: Secondary | ICD-10-CM | POA: Diagnosis not present

## 2015-06-24 DIAGNOSIS — Z66 Do not resuscitate: Secondary | ICD-10-CM | POA: Diagnosis not present

## 2015-06-24 DIAGNOSIS — I10 Essential (primary) hypertension: Secondary | ICD-10-CM | POA: Insufficient documentation

## 2015-06-24 DIAGNOSIS — K219 Gastro-esophageal reflux disease without esophagitis: Secondary | ICD-10-CM | POA: Diagnosis not present

## 2015-06-24 DIAGNOSIS — R55 Syncope and collapse: Secondary | ICD-10-CM | POA: Diagnosis not present

## 2015-06-24 DIAGNOSIS — R52 Pain, unspecified: Secondary | ICD-10-CM | POA: Diagnosis present

## 2015-06-24 DIAGNOSIS — W19XXXA Unspecified fall, initial encounter: Secondary | ICD-10-CM | POA: Diagnosis not present

## 2015-06-24 DIAGNOSIS — Z23 Encounter for immunization: Secondary | ICD-10-CM | POA: Diagnosis not present

## 2015-06-24 DIAGNOSIS — Z7901 Long term (current) use of anticoagulants: Secondary | ICD-10-CM | POA: Insufficient documentation

## 2015-06-24 DIAGNOSIS — F17290 Nicotine dependence, other tobacco product, uncomplicated: Secondary | ICD-10-CM | POA: Insufficient documentation

## 2015-06-24 DIAGNOSIS — H269 Unspecified cataract: Secondary | ICD-10-CM | POA: Insufficient documentation

## 2015-06-24 DIAGNOSIS — S129XXA Fracture of neck, unspecified, initial encounter: Secondary | ICD-10-CM | POA: Diagnosis not present

## 2015-06-24 DIAGNOSIS — R262 Difficulty in walking, not elsewhere classified: Secondary | ICD-10-CM | POA: Diagnosis not present

## 2015-06-24 DIAGNOSIS — Z9981 Dependence on supplemental oxygen: Secondary | ICD-10-CM | POA: Diagnosis not present

## 2015-06-24 DIAGNOSIS — S12000A Unspecified displaced fracture of first cervical vertebra, initial encounter for closed fracture: Principal | ICD-10-CM | POA: Insufficient documentation

## 2015-06-24 DIAGNOSIS — E785 Hyperlipidemia, unspecified: Secondary | ICD-10-CM | POA: Diagnosis not present

## 2015-06-24 DIAGNOSIS — I5022 Chronic systolic (congestive) heart failure: Secondary | ICD-10-CM | POA: Diagnosis not present

## 2015-06-24 DIAGNOSIS — S098XXA Other specified injuries of head, initial encounter: Secondary | ICD-10-CM | POA: Diagnosis not present

## 2015-06-24 DIAGNOSIS — I255 Ischemic cardiomyopathy: Secondary | ICD-10-CM | POA: Insufficient documentation

## 2015-06-24 DIAGNOSIS — E1159 Type 2 diabetes mellitus with other circulatory complications: Secondary | ICD-10-CM

## 2015-06-24 DIAGNOSIS — I442 Atrioventricular block, complete: Secondary | ICD-10-CM | POA: Diagnosis not present

## 2015-06-24 DIAGNOSIS — Z950 Presence of cardiac pacemaker: Secondary | ICD-10-CM | POA: Insufficient documentation

## 2015-06-24 DIAGNOSIS — IMO0002 Reserved for concepts with insufficient information to code with codable children: Secondary | ICD-10-CM

## 2015-06-24 DIAGNOSIS — I4891 Unspecified atrial fibrillation: Secondary | ICD-10-CM | POA: Diagnosis not present

## 2015-06-24 DIAGNOSIS — D638 Anemia in other chronic diseases classified elsewhere: Secondary | ICD-10-CM | POA: Diagnosis present

## 2015-06-24 DIAGNOSIS — S61215A Laceration without foreign body of left ring finger without damage to nail, initial encounter: Secondary | ICD-10-CM | POA: Insufficient documentation

## 2015-06-24 LAB — RETICULOCYTES
RBC.: 3.01 MIL/uL — AB (ref 4.22–5.81)
RETIC COUNT ABSOLUTE: 51.2 10*3/uL (ref 19.0–186.0)
Retic Ct Pct: 1.7 % (ref 0.4–3.1)

## 2015-06-24 LAB — CBC WITH DIFFERENTIAL/PLATELET
BASOS PCT: 0 %
Basophils Absolute: 0 10*3/uL (ref 0.0–0.1)
EOS ABS: 0.1 10*3/uL (ref 0.0–0.7)
EOS PCT: 4 %
HCT: 26.4 % — ABNORMAL LOW (ref 39.0–52.0)
Hemoglobin: 8.9 g/dL — ABNORMAL LOW (ref 13.0–17.0)
LYMPHS ABS: 0.5 10*3/uL — AB (ref 0.7–4.0)
Lymphocytes Relative: 13 %
MCH: 31.6 pg (ref 26.0–34.0)
MCHC: 33.7 g/dL (ref 30.0–36.0)
MCV: 93.6 fL (ref 78.0–100.0)
Monocytes Absolute: 0.3 10*3/uL (ref 0.1–1.0)
Monocytes Relative: 8 %
NEUTROS PCT: 75 %
Neutro Abs: 2.8 10*3/uL (ref 1.7–7.7)
PLATELETS: 126 10*3/uL — AB (ref 150–400)
RBC: 2.82 MIL/uL — AB (ref 4.22–5.81)
RDW: 14.5 % (ref 11.5–15.5)
WBC: 3.7 10*3/uL — ABNORMAL LOW (ref 4.0–10.5)

## 2015-06-24 LAB — BASIC METABOLIC PANEL
Anion gap: 9 (ref 5–15)
BUN: 16 mg/dL (ref 6–20)
CO2: 23 mmol/L (ref 22–32)
CREATININE: 1.24 mg/dL (ref 0.61–1.24)
Calcium: 8.9 mg/dL (ref 8.9–10.3)
Chloride: 109 mmol/L (ref 101–111)
GFR, EST AFRICAN AMERICAN: 59 mL/min — AB (ref >60)
GFR, EST NON AFRICAN AMERICAN: 51 mL/min — AB (ref >60)
Glucose, Bld: 130 mg/dL — ABNORMAL HIGH (ref 65–99)
Potassium: 4 mmol/L (ref 3.5–5.1)
SODIUM: 141 mmol/L (ref 135–145)

## 2015-06-24 LAB — IRON AND TIBC
IRON: 54 ug/dL (ref 45–182)
Saturation Ratios: 23 % (ref 17.9–39.5)
TIBC: 239 ug/dL — ABNORMAL LOW (ref 250–450)
UIBC: 185 ug/dL

## 2015-06-24 LAB — GLUCOSE, CAPILLARY: GLUCOSE-CAPILLARY: 219 mg/dL — AB (ref 65–99)

## 2015-06-24 LAB — FERRITIN: Ferritin: 165 ng/mL (ref 24–336)

## 2015-06-24 LAB — VITAMIN B12: VITAMIN B 12: 703 pg/mL (ref 180–914)

## 2015-06-24 MED ORDER — ATORVASTATIN CALCIUM 10 MG PO TABS
20.0000 mg | ORAL_TABLET | Freq: Every day | ORAL | Status: DC
  Administered 2015-06-24 – 2015-06-28 (×5): 20 mg via ORAL
  Filled 2015-06-24 (×5): qty 2

## 2015-06-24 MED ORDER — CARVEDILOL 3.125 MG PO TABS
3.1250 mg | ORAL_TABLET | Freq: Two times a day (BID) | ORAL | Status: DC
  Administered 2015-06-24 – 2015-06-25 (×2): 3.125 mg via ORAL
  Filled 2015-06-24 (×2): qty 1

## 2015-06-24 MED ORDER — MAGNESIUM CITRATE PO SOLN: 1.0000 | Freq: Once | ORAL | Status: DC | PRN

## 2015-06-24 MED ORDER — HYDROCODONE-ACETAMINOPHEN 5-325 MG PO TABS
1.0000 | ORAL_TABLET | Freq: Four times a day (QID) | ORAL | Status: DC | PRN
  Administered 2015-06-24 – 2015-06-25 (×3): 2 via ORAL
  Filled 2015-06-24 (×4): qty 2

## 2015-06-24 MED ORDER — DIPHENHYDRAMINE HCL (SLEEP) 25 MG PO TABS
25.0000 mg | ORAL_TABLET | Freq: Two times a day (BID) | ORAL | Status: DC | PRN
Start: 2015-06-24 — End: 2015-06-24

## 2015-06-24 MED ORDER — CYCLOSPORINE 0.05 % OP EMUL: 1.0000 [drp] | Freq: Every day | OPHTHALMIC | Status: DC

## 2015-06-24 MED ORDER — TETANUS-DIPHTH-ACELL PERTUSSIS 5-2.5-18.5 LF-MCG/0.5 IM SUSP
0.5000 mL | Freq: Once | INTRAMUSCULAR | Status: AC
  Administered 2015-06-24: 0.5 mL via INTRAMUSCULAR
  Filled 2015-06-24: qty 0.5

## 2015-06-24 MED ORDER — MORPHINE SULFATE (PF) 2 MG/ML IV SOLN
2.0000 mg | INTRAVENOUS | Status: DC | PRN
  Administered 2015-06-24 – 2015-06-25 (×2): 2 mg via INTRAVENOUS
  Filled 2015-06-24 (×2): qty 1

## 2015-06-24 MED ORDER — LORAZEPAM 0.5 MG PO TABS
0.5000 mg | ORAL_TABLET | Freq: Two times a day (BID) | ORAL | Status: DC
  Administered 2015-06-24 – 2015-06-26 (×4): 0.5 mg via ORAL
  Filled 2015-06-24 (×4): qty 1

## 2015-06-24 MED ORDER — ASPIRIN EC 81 MG PO TBEC
81.0000 mg | DELAYED_RELEASE_TABLET | Freq: Every day | ORAL | Status: DC
  Administered 2015-06-24 – 2015-06-28 (×5): 81 mg via ORAL
  Filled 2015-06-24 (×7): qty 1

## 2015-06-24 MED ORDER — PANTOPRAZOLE SODIUM 40 MG PO TBEC
40.0000 mg | DELAYED_RELEASE_TABLET | Freq: Every day | ORAL | Status: DC
  Administered 2015-06-24 – 2015-06-28 (×5): 40 mg via ORAL
  Filled 2015-06-24 (×5): qty 1

## 2015-06-24 MED ORDER — ESCITALOPRAM OXALATE 10 MG PO TABS
10.0000 mg | ORAL_TABLET | Freq: Every day | ORAL | Status: DC
  Administered 2015-06-24 – 2015-06-28 (×5): 10 mg via ORAL
  Filled 2015-06-24 (×5): qty 1

## 2015-06-24 MED ORDER — ALUM & MAG HYDROXIDE-SIMETH 200-200-20 MG/5ML PO SUSP: 30.0000 mL | Freq: Four times a day (QID) | ORAL | Status: DC | PRN

## 2015-06-24 MED ORDER — ALLOPURINOL 100 MG PO TABS
300.0000 mg | ORAL_TABLET | Freq: Every day | ORAL | Status: DC
  Administered 2015-06-24 – 2015-06-28 (×5): 300 mg via ORAL
  Filled 2015-06-24 (×6): qty 3

## 2015-06-24 MED ORDER — ONDANSETRON HCL 4 MG PO TABS: 4.0000 mg | ORAL_TABLET | Freq: Four times a day (QID) | ORAL | Status: DC | PRN

## 2015-06-24 MED ORDER — DIPHENHYDRAMINE HCL 25 MG PO CAPS: 25.0000 mg | ORAL_CAPSULE | Freq: Four times a day (QID) | ORAL | Status: DC | PRN

## 2015-06-24 MED ORDER — OLOPATADINE HCL 0.1 % OP SOLN
1.0000 [drp] | Freq: Two times a day (BID) | OPHTHALMIC | Status: DC
  Administered 2015-06-24 – 2015-06-28 (×7): 1 [drp] via OPHTHALMIC
  Filled 2015-06-24 (×2): qty 5

## 2015-06-24 MED ORDER — FAMOTIDINE 20 MG PO TABS
20.0000 mg | ORAL_TABLET | Freq: Every day | ORAL | Status: DC
  Administered 2015-06-24 – 2015-06-28 (×5): 20 mg via ORAL
  Filled 2015-06-24 (×5): qty 1

## 2015-06-24 MED ORDER — ONDANSETRON 4 MG PO TBDP
8.0000 mg | ORAL_TABLET | Freq: Once | ORAL | Status: AC
  Administered 2015-06-24: 8 mg via ORAL
  Filled 2015-06-24: qty 2

## 2015-06-24 MED ORDER — SPIRONOLACTONE 25 MG PO TABS
12.5000 mg | ORAL_TABLET | Freq: Every day | ORAL | Status: DC
  Administered 2015-06-25 – 2015-06-28 (×4): 12.5 mg via ORAL
  Filled 2015-06-24 (×4): qty 1

## 2015-06-24 MED ORDER — SUCRALFATE 1 G PO TABS
1.0000 g | ORAL_TABLET | Freq: Two times a day (BID) | ORAL | Status: DC
  Administered 2015-06-24 – 2015-06-28 (×7): 1 g via ORAL
  Filled 2015-06-24 (×8): qty 1

## 2015-06-24 MED ORDER — HYDROMORPHONE HCL 1 MG/ML IJ SOLN
1.0000 mg | Freq: Once | INTRAMUSCULAR | Status: AC
  Administered 2015-06-24: 1 mg via INTRAMUSCULAR
  Filled 2015-06-24: qty 1

## 2015-06-24 MED ORDER — LIDOCAINE HCL 2 % IJ SOLN
5.0000 mL | Freq: Once | INTRAMUSCULAR | Status: AC
  Administered 2015-06-24: 100 mg
  Filled 2015-06-24: qty 20

## 2015-06-24 MED ORDER — ONDANSETRON HCL 4 MG/2ML IJ SOLN: 4.0000 mg | Freq: Four times a day (QID) | INTRAMUSCULAR | Status: DC | PRN

## 2015-06-24 MED ORDER — TIZANIDINE HCL 2 MG PO TABS
2.0000 mg | ORAL_TABLET | Freq: Three times a day (TID) | ORAL | Status: DC | PRN
Start: 2015-06-24 — End: 2015-06-28
  Filled 2015-06-24: qty 1

## 2015-06-24 MED ORDER — SODIUM CHLORIDE 0.9 % IV SOLN
INTRAVENOUS | Status: AC
  Administered 2015-06-24: 20:00:00 via INTRAVENOUS

## 2015-06-24 NOTE — Progress Notes (Signed)
Pt admitted from ED with c/o of a fall, pt alert and c/o of slight pain in the neck with aspen collar on,pt settled in bed with call light at bedside, v/s stable, will however continue to monitor. [name], [name] Nease Efe

## 2015-06-24 NOTE — H&P (Signed)
[facility] History and Physical  [name] ACZ:[id number] DOB: Jul 10, 1927 DOA: 06/24/2015  Referring physician: Betsey Holiday PCP: [name], MD   Chief Complaint: fall/neck pain  HPI: [name] is a very pleasant 81 y.o. male with past medical history systolic heart failure, ischemic cardiomyopathy status post ICD, hypertension, GERD, diabetes, 13-calorie malnutrition presents to the emergency department status post mechanical fall complaining of a head laceration and neck pain. Initial evaluation reveals C1 fracture, anemia laceration to his forehead and his right finger and severe neck pain. Information obtained from the patient. He said he was changing his close this morning while pulling his T-shirt up over his head he lost his balance and fell forward hitting his forehead. He denies losing consciousness. He reports noting immediately that he was bleeding from the head. He also immediately noted neck pain. He said he scrambled to find a chair to raise himself up and called for help. He denies chest pain palpitations shortness of breath. He denies any recent fever chills sick contacts. He does report chronic constipation with his last BMP and yesterday. He denies any dysuria hematuria frequency or urgency. He denies bright red blood per rectum or melena.  Workup in the emergency department includes asymmetric metabolic panel significant for glucose 130 otherwise unremarkable, complete blood count with WBC 3.7 hemoglobin 8.9 hematocrit 26.4 platelets 126. Head CT reveals no acute intracranial abnormality and C1 fracture, lumbar spine x-ray no acute abnormality unchanged anterolisthesis of L4 and L5 with advanced L4-5 disc degeneration, through the right ring finger mild curvilinear soft tissue calcification no fracture, EKG pacemaker spikes or artifacts sinus bradycardia paired ventricular premature complexes RBBB and LPFB Repol abnrm suggests ischemia. The emergency department  he's afebrile hemodynamically stable and not hypoxic   Review of Systems:  10 point review of systems complete and all systems are negative except as indicated in the history of present illness  Past Medical History  Diagnosis Date  . CAD (coronary artery disease)     s/p CABG; s/p Pacemaker  . Systolic CHF with reduced left ventricular function, NYHA class 2 ([place])   . Ischemic cardiomyopathy     s/p ICD  . Hypertension   . Hyperlipidemia   . Gout   . Paroxysmal ventricular tachycardia ([place])   . Diverticulosis of colon   . AICD (automatic cardioverter/defibrillator) present   . Arthritis     "shoulders" (10/31/2014)  . On home oxygen therapy     "2L prn" (10/31/2014)  . Shortness of breath dyspnea     with exertion  . GERD (gastroesophageal reflux disease)   . SBO (small bowel obstruction) ([place]) 10/31/2014   Past Surgical History  Procedure Laterality Date  . Coronary artery bypass graft  03/2003    CABG X3/notes 01/18/2011  . Bowel resection    . Coronary angioplasty with stent placement  04/2011    2 stents/notes 05/03/2011  . Implantable cardioverter defibrillator (icd) generator change Left 10/12/2011    Procedure: ICD GENERATOR CHANGE;  Surgeon: Evans Lance, MD;  Location[facility] CATH LAB;  Service: Cardiovascular;  Laterality: Left;  . Pacemaker revision N/A 10/12/2011    Procedure: PACEMAKER REVISION;  Surgeon: Evans Lance, MD;  Location: [facility] CATH LAB;  Service: Cardiovascular;  Laterality: N/A;  . Bi-ventricular implantable cardioverter defibrillator upgrade N/A 01/22/2014    Procedure: BI-VENTRICULAR IMPLANTABLE CARDIOVERTER DEFIBRILLATOR UPGRADE;  Surgeon: Evans Lance, MD;  Location: [facility] CATH LAB;  Service: Cardiovascular;  Laterality: N/A;  . Tonsillectomy    .  Cholecystectomy    . Cataract extraction w/ intraocular lens  implant, bilateral Bilateral   . Tee with cardioversion  03/2003    Archie Endo 01/18/2011  . Cardiac defibrillator placement  07/2003    Archie Endo 01/18/2011    . Cardiac catheterization  03/2011    Archie Endo 01/18/2011  . Eye surgery Bilateral     [place]  . Cataract extraction w/phaco Right 12/17/2014    Procedure: PHACOEMULSIFICATION CATARACT EXTRACTION WITH IOL IMPLANT RIGHT EYE;  Surgeon: Marylynn Pearson, MD;  Location: [place];  Service: Ophthalmology;  Laterality: Right;  . Pacemaker placement     Social History:  reports that he has never smoked. He has quit using smokeless tobacco. His smokeless tobacco use included Chew. He reports that he drinks alcohol. He reports that he does not use illicit drugs. He lives in an apartment alone he has fair family support. He is independent with ADLs he does have cataracts ovaries visually impaired at the moment No Known Allergies  No family history on file. family medical history reviewed and noncontributory to the admission of this elderly gentleman  Prior to Admission medications   Medication Sig Start Date End Date Taking? Authorizing Provider  allopurinol (ZYLOPRIM) 300 MG tablet Take 300 mg by mouth daily.      Historical Provider, MD  atorvastatin (LIPITOR) 20 MG tablet Take 20 mg by mouth daily.      Historical Provider, MD  carvedilol (COREG) 6.25 MG tablet Take 3.125 mg by mouth 2 (two) times daily.     Historical Provider, MD  cycloSPORINE (RESTASIS) 0.05 % ophthalmic emulsion Place 1 drop into both eyes daily.    Historical Provider, MD  diphenhydrAMINE (SOMINEX) 25 MG tablet Take 25 mg by mouth 2 (two) times daily as needed for allergies or sleep.    Historical Provider, MD  docusate sodium (COLACE) 100 MG capsule Take 1 capsule (100 mg total) by mouth every 12 (twelve) hours. Patient not taking: Reported on 06/18/2015 01/07/14   Hazel Sams, PA-C  escitalopram (LEXAPRO) 10 MG tablet Take 10 mg by mouth daily.  07/11/14   Historical Provider, MD  esomeprazole (NEXIUM) 20 MG capsule Take 20 mg by mouth daily at 12 noon.    Historical Provider, MD  famotidine (PEPCID) 20 MG tablet Take 20 mg by mouth  daily.     Historical Provider, MD  FeBisg-FeCbn-C-FA-B12-Biot-DSS (FERIVA PO) Take 1 tablet by mouth daily.    Historical Provider, MD  HYDROcodone-acetaminophen (NORCO/VICODIN) 5-325 MG tablet Take 1-2 tablets by mouth every 6 (six) hours as needed for moderate pain.    Historical Provider, MD  loperamide (IMODIUM A-D) 2 MG tablet Take 2 mg by mouth every 6 (six) hours as needed for diarrhea or loose stools.    Historical Provider, MD  LORazepam (ATIVAN) 0.5 MG tablet Take 0.5 mg by mouth 2 (two) times daily.    Historical Provider, MD  Misc Natural Products (PROSTATE HEALTH PO) Take 1 tablet by mouth daily.    Historical Provider, MD  Multiple Vitamins-Minerals (CENTRUM SILVER ULTRA MENS PO) Take 1 tablet by mouth daily.    Historical Provider, MD  NITROSTAT 0.4 MG SL tablet Place 0.4 mg under the tongue every 5 (five) minutes as needed for chest pain (MAX 3 TABLETS).  12/05/12   Historical Provider, MD  PATADAY 0.2 % SOLN Place 1 drop into both eyes daily as needed (irritation).  01/14/14   Historical Provider, MD  RA ASPIRIN ADULT LOW STRENGTH 81 MG EC tablet Take 1  tablet by mouth daily. 09/01/14   Historical Provider, MD  ramipril (ALTACE) 2.5 MG capsule Take 2.5 mg by mouth daily.    Historical Provider, MD  Rivaroxaban (XARELTO) 15 MG TABS tablet Take 1 tablet (15 mg total) by mouth daily. Patient not taking: Reported on 06/18/2015 07/30/14   Charolette Forward, MD  spironolactone (ALDACTONE) 12.5 mg TABS tablet Take 0.5 tablets (12.5 mg total) by mouth daily. Patient not taking: Reported on 06/18/2015 07/30/14   Charolette Forward, MD  sucralfate (CARAFATE) 1 G tablet Take 1 g by mouth 2 (two) times daily.     Historical Provider, MD  tiZANidine (ZANAFLEX) 2 MG tablet Take 1 tablet (2 mg total) by mouth every 8 (eight) hours as needed for muscle spasms. 06/18/15   Gareth Morgan, MD   Physical Exam: Filed Vitals:   06/24/15 1515 06/24/15 1530 06/24/15 1630 06/24/15 1700  BP: 131/65 141/74 144/79  141/76  Pulse: 66 71 65 74  Temp:      TempSrc:      Resp:      Height:      Weight:      SpO2: 97% 99% 100% 99%    Wt Readings from Last 3 Encounters:  06/24/15 71.668 kg (158 lb)  12/17/14 63.504 kg (140 lb)  11/04/14 65.363 kg (144 lb 1.6 oz)    General:  Appears calm and comfortable Eyes: PERRL, normal lids, irises & conjunctiva ENT: grossly normal hearing, does membranes of his mouth are slightly pale dry Neck: no LAD, masses or thyromegaly Cardiovascular: RRR, no m/r/g. No LE edema.   Respiratory: CTA bilaterally, no w/r/r. Normal respiratory effort. Abdomen: soft, ntnd has a bowel sounds no guarding or rebounding Skin: no rash or induration seen on limited exam, dressing to forehead where laceration dressed as well as left ring finger Musculoskeletal: grossly normal tone BUE/BLE c-collar in place. Joints without swelling/erythema Psychiatric: grossly normal mood and affect, speech fluent and appropriate Neurologic: grossly non-focal. Speech clear           Labs on Admission:  Basic Metabolic Panel:  Recent Labs Lab 06/18/15 1059 06/24/15 1500  NA 141 141  K 3.7 4.0  CL 111 109  CO2 22 23  GLUCOSE 126* 130*  BUN 19 16  CREATININE 1.20 1.24  CALCIUM 9.4 8.9   Liver Function Tests:  Recent Labs Lab 06/18/15 1059  AST 42*  ALT 16*  ALKPHOS 70  BILITOT 1.2  PROT 6.9  ALBUMIN 4.0   No results for input(s): LIPASE, AMYLASE in the last 168 hours. No results for input(s): AMMONIA in the last 168 hours. CBC:  Recent Labs Lab 06/18/15 1059 06/24/15 1500  WBC 4.5 3.7*  NEUTROABS 3.5 2.8  HGB 10.0* 8.9*  HCT 29.6* 26.4*  MCV 96.4 93.6  PLT 147* 126*   Cardiac Enzymes: No results for input(s): CKTOTAL, CKMB, CKMBINDEX, TROPONINI in the last 168 hours.  BNP (last 3 results) No results for input(s): BNP in the last 8760 hours.  ProBNP (last 3 results)  Recent Labs  07/26/14 1608  PROBNP 2011.0*    CBG: No results for input(s): GLUCAP in  the last 168 hours.  Radiological Exams on Admission: Dg Lumbar Spine 2-3 Views  06/24/2015  CLINICAL DATA:  Fall today. Known cervical spine fracture. Initial encounter. EXAM: LUMBAR SPINE - 2-3 VIEW COMPARISON:  CT abdomen and pelvis 10/31/2014 FINDINGS: There are 5 non rib-bearing lumbar type vertebral bodies. Grade 1 anterolisthesis is again seen of L4 on L5 and  measures approximately 8 mm, unchanged and facet mediated. Severe L4-5 disc space narrowing and L5 superior endplate Schmorl's node deformity are similar to the prior CT. There is no evidence of acute fracture. Diffuse aortoiliac atherosclerotic calcification is noted. Phleboliths are present in the pelvis. IMPRESSION: 1. No acute osseous abnormality identified in the lumbar spine. 2. Unchanged anterolisthesis of L4 on L5 and advanced L4-5 disc degeneration. Electronically Signed   By: Logan Bores M.D.   On: 06/24/2015 16:20   Ct Head Wo Contrast  06/24/2015  CLINICAL DATA:  Fall from standing while putting on a shirt. Lost balance and hit the floor, C1 fracture. Initial encounter. EXAM: CT HEAD WITHOUT CONTRAST TECHNIQUE: Contiguous axial images were obtained from the base of the skull through the vertex without intravenous contrast. COMPARISON:  CT cervical spine 06/24/2015 and CT head 01/16/2013. FINDINGS: No evidence of an acute infarct, acute hemorrhage, mass lesion, mass effect or hydrocephalus. Atrophy. Encephalomalacia in the left parietal lobe. Confluent low-attenuation in the periventricular deep white matter. Small remote infarcts in the cerebellar hemispheres. C1 fracture is partially imaged. No additional evidence of an acute fracture. Mandibular condyles are located. Scattered mucosal thickening in the paranasal sinuses. IMPRESSION: 1. No acute intracranial abnormality. 2. C1 fracture partially imaged. Please see dedicated CT cervical spine dictated the same day for further details. 3. Atrophy, chronic microvascular white matter  ischemic changes and remote infarcts. Electronically Signed   By: Lorin Picket M.D.   On: 06/24/2015 15:56   Ct Cervical Spine Wo Contrast  06/24/2015  CLINICAL DATA:  Imbalance with fall. Neck pain radiating to the head. EXAM: CT CERVICAL SPINE WITHOUT CONTRAST TECHNIQUE: Multidetector CT imaging of the cervical spine was performed without intravenous contrast. Multiplanar CT image reconstructions were also generated. COMPARISON:  None. FINDINGS: There is a comminuted fracture of the mid anterior C1 arch. There is a comminuted fracture of the mid posterior C1 arch. There is 4 mm lateral subluxation of the right lateral C1 mass relative to the C2 lateral mass. There is 2 mm lateral subluxation of the left C1 mass relative to the left C2 articular mass. There is associated prevertebral soft tissue swelling. There is associated edema and hemorrhage in the transverse ligament causing at least mild anterior canal stenosis at the C1 level. No additional cervical spine fracture. There is straightening of the cervical spine, usually due to positioning and/or muscle spasm. There is severe degenerative disc disease throughout the cervical spine, most prominent at C3-4, C5-6 and C6-7 with ankylosis at C6-7. There is minimal 2 mm anterolisthesis at C2-3, minimal 2 mm retrolisthesis at C4-5 and mild 3 mm anterolisthesis at C7-T1, likely degenerative. There is moderate foraminal stenosis on the right at C2-3. There is mild foraminal stenosis on the left at C3-4 and on the left at C5-6. Visualized mastoid air cells appear clear. No evidence of intra-axial hemorrhage in the visualized brain. Visualized lung apices are clear. No appreciable cervical adenopathy. There is atherosclerotic calcification in the bilateral extracranial carotid arteries. Scout topogram demonstrates partially visualized multi lead left subclavian ICD and intact median sternotomy wires. IMPRESSION: 1. Comminuted mid anterior and posterior C1 arch  fractures, which appear acute, with mild lateral subluxation of the lateral C1 masses relative to the lateral C2 masses bilaterally. 2. Prevertebral and transverse ligament edema/hemorrhage causing at least mild anterior canal stenosis at the C1 level. 3. Severe degenerative disc disease and minimal multilevel degenerative spondylolisthesis throughout the cervical spine. These results were called by telephone at the  time of interpretation on 06/24/2015 at 2:24 pm to Dr. Joseph Berkshire , who verbally acknowledged these results. Electronically Signed   By: Ilona Sorrel M.D.   On: 06/24/2015 14:27   Dg Finger Ring Left  06/24/2015  CLINICAL DATA:  Left fourth finger laceration after fall. EXAM: LEFT RING FINGER 2+V COMPARISON:  None. FINDINGS: No fracture, dislocation or suspicious focal osseous lesion. Joint spaces appear normal. There are mild curvilinear soft tissue calcifications along the radial aspect of the fourth finger. IMPRESSION: 1. No fracture or malalignment in the left fourth finger. 2. Mild curvilinear soft tissue calcifications along the radial aspect of the left fourth finger, nonspecific, possibly due to gout or an inflammatory condition such as polymyositis/dermatomyositis. Electronically Signed   By: Ilona Sorrel M.D.   On: 06/24/2015 16:17    EKG: Independently reviewed. Pacemaker spikes or artifacts sinus bradycardia paired ventricular premature complexes RBBB and LPFB repol abnrm suggests ischemia, lateral leads  Assessment/Plan Principal Problem:   C1 cervical fracture (HCC) Active Problems:   Coronary atherosclerosis   Chronic systolic heart failure (HCC)   Diabetes mellitus (HCC)   Anemia   Laceration   Intractable pain   A-fib ([place])  #1. C1 cervical fracture. Evaluated by neurosurgery in the emergency department who opined anterior abnormality in C1 acute and recommends cervical collar. Pain management and likely discharge in the a.m. Also request physical therapy  for evaluation.  #2. Anemia. Chart review indicates hemoglobin much less baseline. May be related to chronic illness. Denies any bright red blood per rectum or melena. Will obtain an anemia panel. Request FOBT.  #3 fall. Mechanical. Check orthostatics. He does have cataracts may contribute to unsteady. Check orthostatics.  #4. Diet controlled. Check hemoglobin A1c. CBG 130 on admission monitor  5. He is to chronic systolic heart failure. Currentlyeuvlemic. Echo 1 year ago reveals EF of 2025% with diffuse hypokinesis. Continue his home medications. Daily weights. Monitor intake and output  #6 history of A. fib/flutter. History of complete heart block ICD in place. Device interrogated while in the emergency department. Previously on Xarelto but stopped taking that due to fall risk     Code Status: DNR DVT Prophylaxis: Family Communication: POA at bedside Disposition Plan: home 24-48 hours  Time spent: 48 minutes  [facility]

## 2015-06-24 NOTE — ED Notes (Signed)
Attempted report 

## 2015-06-24 NOTE — ED Notes (Addendum)
EMS transported patient.  Putting a shirt on fell down hit forehead no LOC. Laceration forehead bleeding controlled with bandage and intact. Alert answering and following commands appropriate. C-collar placed prior to arrival.

## 2015-06-24 NOTE — ED Provider Notes (Signed)
CSN: [id number]     Arrival date & time 06/24/15  1143 History   First MD Initiated Contact with Patient 06/24/15 1145     Chief Complaint  Patient presents with  . Fall  . Head Laceration     (Consider location/radiation/quality/duration/timing/severity/associated sxs/prior Treatment) HPI Comments: Patient presents to the ER for evaluation after a fall. Patient reports that he lost his balance while getting dressed and fell forward, hitting his forehead. Patient reports bleeding initially but this has become controlled. There was no loss of consciousness. Patient complaining of neck pain. He reports that he has been experiencing ongoing issues with neck pain which is unchanged since he fell.  Patient is a 81 y.o. male presenting with fall and scalp laceration.  Fall  Head Laceration    Past Medical History  Diagnosis Date  . CAD (coronary artery disease)     s/p CABG; s/p Pacemaker  . Systolic CHF with reduced left ventricular function, NYHA class 2 ([place])   . Ischemic cardiomyopathy     s/p ICD  . Hypertension   . Hyperlipidemia   . Gout   . Paroxysmal ventricular tachycardia ([place])   . Diverticulosis of colon   . AICD (automatic cardioverter/defibrillator) present   . Arthritis     "shoulders" (10/31/2014)  . On home oxygen therapy     "2L prn" (10/31/2014)  . Shortness of breath dyspnea     with exertion  . GERD (gastroesophageal reflux disease)   . SBO (small bowel obstruction) ([place]) 10/31/2014   Past Surgical History  Procedure Laterality Date  . Coronary artery bypass graft  03/2003    CABG X3/notes 01/18/2011  . Bowel resection    . Coronary angioplasty with stent placement  04/2011    2 stents/notes 05/03/2011  . Implantable cardioverter defibrillator (icd) generator change Left 10/12/2011    Procedure: ICD GENERATOR CHANGE;  Surgeon: Evans Lance, MD;  Location: [facility] CATH LAB;  Service: Cardiovascular;  Laterality: Left;  . Pacemaker revision N/A 10/12/2011   Procedure: PACEMAKER REVISION;  Surgeon: Evans Lance, MD;  Location: [facility] CATH LAB;  Service: Cardiovascular;  Laterality: N/A;  . Bi-ventricular implantable cardioverter defibrillator upgrade N/A 01/22/2014    Procedure: BI-VENTRICULAR IMPLANTABLE CARDIOVERTER DEFIBRILLATOR UPGRADE;  Surgeon: Evans Lance, MD;  Location: [facility] CATH LAB;  Service: Cardiovascular;  Laterality: N/A;  . Tonsillectomy    . Cholecystectomy    . Cataract extraction w/ intraocular lens  implant, bilateral Bilateral   . Tee with cardioversion  03/2003    Archie Endo 01/18/2011  . Cardiac defibrillator placement  07/2003    Archie Endo 01/18/2011  . Cardiac catheterization  03/2011    Archie Endo 01/18/2011  . Eye surgery Bilateral     [place]  . Cataract extraction w/phaco Right 12/17/2014    Procedure: PHACOEMULSIFICATION CATARACT EXTRACTION WITH IOL IMPLANT RIGHT EYE;  Surgeon: Marylynn Pearson, MD;  Location: [place];  Service: Ophthalmology;  Laterality: Right;  . Pacemaker placement     No family history on file. Social History  Substance Use Topics  . Smoking status: Never Smoker   . Smokeless tobacco: Former Systems developer    Types: Chew     Comment: no chew in over 3 years  . Alcohol Use: Yes     Comment: "might take a drink during the holidays"    Review of Systems  Musculoskeletal: Positive for neck pain.  Skin: Positive for wound.  All other systems reviewed and are negative.     Allergies  Review  of patient's allergies indicates no known allergies.  Home Medications   Prior to Admission medications   Medication Sig Start Date End Date Taking? Authorizing Provider  allopurinol (ZYLOPRIM) 300 MG tablet Take 300 mg by mouth daily.      Historical Provider, MD  atorvastatin (LIPITOR) 20 MG tablet Take 20 mg by mouth daily.      Historical Provider, MD  carvedilol (COREG) 6.25 MG tablet Take 3.125 mg by mouth 2 (two) times daily.     Historical Provider, MD  cycloSPORINE (RESTASIS) 0.05 % ophthalmic emulsion Place 1 drop  into both eyes daily.    Historical Provider, MD  diphenhydrAMINE (SOMINEX) 25 MG tablet Take 25 mg by mouth 2 (two) times daily as needed for allergies or sleep.    Historical Provider, MD  docusate sodium (COLACE) 100 MG capsule Take 1 capsule (100 mg total) by mouth every 12 (twelve) hours. Patient not taking: Reported on 06/18/2015 01/07/14   Hazel Sams, PA-C  escitalopram (LEXAPRO) 10 MG tablet Take 10 mg by mouth daily.  07/11/14   Historical Provider, MD  esomeprazole (NEXIUM) 20 MG capsule Take 20 mg by mouth daily at 12 noon.    Historical Provider, MD  famotidine (PEPCID) 20 MG tablet Take 20 mg by mouth daily.     Historical Provider, MD  FeBisg-FeCbn-C-FA-B12-Biot-DSS (FERIVA PO) Take 1 tablet by mouth daily.    Historical Provider, MD  HYDROcodone-acetaminophen (NORCO/VICODIN) 5-325 MG tablet Take 1-2 tablets by mouth every 6 (six) hours as needed for moderate pain.    Historical Provider, MD  loperamide (IMODIUM A-D) 2 MG tablet Take 2 mg by mouth every 6 (six) hours as needed for diarrhea or loose stools.    Historical Provider, MD  LORazepam (ATIVAN) 0.5 MG tablet Take 0.5 mg by mouth 2 (two) times daily.    Historical Provider, MD  Misc Natural Products (PROSTATE HEALTH PO) Take 1 tablet by mouth daily.    Historical Provider, MD  Multiple Vitamins-Minerals (CENTRUM SILVER ULTRA MENS PO) Take 1 tablet by mouth daily.    Historical Provider, MD  NITROSTAT 0.4 MG SL tablet Place 0.4 mg under the tongue every 5 (five) minutes as needed for chest pain (MAX 3 TABLETS).  12/05/12   Historical Provider, MD  PATADAY 0.2 % SOLN Place 1 drop into both eyes daily as needed (irritation).  01/14/14   Historical Provider, MD  RA ASPIRIN ADULT LOW STRENGTH 81 MG EC tablet Take 1 tablet by mouth daily. 09/01/14   Historical Provider, MD  ramipril (ALTACE) 2.5 MG capsule Take 2.5 mg by mouth daily.    Historical Provider, MD  Rivaroxaban (XARELTO) 15 MG TABS tablet Take 1 tablet (15 mg total) by mouth  daily. Patient not taking: Reported on 06/18/2015 07/30/14   Charolette Forward, MD  spironolactone (ALDACTONE) 12.5 mg TABS tablet Take 0.5 tablets (12.5 mg total) by mouth daily. Patient not taking: Reported on 06/18/2015 07/30/14   Charolette Forward, MD  sucralfate (CARAFATE) 1 G tablet Take 1 g by mouth 2 (two) times daily.     Historical Provider, MD  tiZANidine (ZANAFLEX) 2 MG tablet Take 1 tablet (2 mg total) by mouth every 8 (eight) hours as needed for muscle spasms. 06/18/15   Gareth Morgan, MD   BP 139/59 mmHg  Pulse 67  Temp(Src) 98.1 F (36.7 C) (Oral)  Resp 16  Ht 5\' 4"  (1.626 m)  Wt 158 lb (71.668 kg)  BMI 27.11 kg/m2  SpO2 97% Physical Exam  Constitutional:  He is oriented to person, place, and time. He appears well-developed and well-nourished. No distress.  HENT:  Head: Normocephalic. Head is with laceration.    Right Ear: Hearing normal.  Left Ear: Hearing normal.  Nose: Nose normal.  Mouth/Throat: Oropharynx is clear and moist and mucous membranes are normal.  Eyes: Conjunctivae and EOM are normal. Pupils are equal, round, and reactive to light.  Neck: Normal range of motion. Neck supple. Muscular tenderness present.  Cardiovascular: Regular rhythm, S1 normal and S2 normal.  Exam reveals no gallop and no friction rub.   No murmur heard. Pulmonary/Chest: Effort normal and breath sounds normal. No respiratory distress. He exhibits no tenderness.  Abdominal: Soft. Normal appearance and bowel sounds are normal. There is no hepatosplenomegaly. There is no tenderness. There is no rebound, no guarding, no tenderness at McBurney's point and negative Murphy's sign. No hernia.  Musculoskeletal: Normal range of motion.       Thoracic back: Normal.       Lumbar back: Normal.  Neurological: He is alert and oriented to person, place, and time. He has normal strength. No cranial nerve deficit or sensory deficit. Coordination normal. GCS eye subscore is 4. GCS verbal subscore is 5. GCS  motor subscore is 6.  Skin: Skin is warm and dry. Laceration (forehead, left ring finger) noted. No rash noted. No cyanosis.  Psychiatric: He has a normal mood and affect. His speech is normal and behavior is normal. Thought content normal.  Nursing note and vitals reviewed.   ED Course  Procedures (including critical care time)  LACERATION REPAIR Performed by: Orpah Greek. Authorized by: Orpah Greek Consent: Verbal consent obtained. Risks and benefits: risks, benefits and alternatives were discussed Consent given by: patient Patient identity confirmed: provided demographic data Prepped and Draped in normal sterile fashion Wound explored  Laceration Location: left ring finger  Laceration Length: 1.25cm  No Foreign Bodies seen or palpated  Anesthesia: local infiltration  Local anesthetic: lidocaine 2% w/o epinephrine  Anesthetic total: 1 ml  Irrigation method: syringe Amount of cleaning: standard  Skin closure: sutures  Number of sutures: 3  Technique: simple interrupted  Patient tolerance: Patient tolerated the procedure well with no immediate complications.   Labs Review Labs Reviewed  CBC WITH DIFFERENTIAL/PLATELET - Abnormal; Notable for the following:    WBC 3.7 (*)    RBC 2.82 (*)    Hemoglobin 8.9 (*)    HCT 26.4 (*)    Platelets 126 (*)    Lymphs Abs 0.5 (*)    All other components within normal limits  BASIC METABOLIC PANEL    Imaging Review Ct Cervical Spine Wo Contrast  06/24/2015  CLINICAL DATA:  Imbalance with fall. Neck pain radiating to the head. EXAM: CT CERVICAL SPINE WITHOUT CONTRAST TECHNIQUE: Multidetector CT imaging of the cervical spine was performed without intravenous contrast. Multiplanar CT image reconstructions were also generated. COMPARISON:  None. FINDINGS: There is a comminuted fracture of the mid anterior C1 arch. There is a comminuted fracture of the mid posterior C1 arch. There is 4 mm lateral  subluxation of the right lateral C1 mass relative to the C2 lateral mass. There is 2 mm lateral subluxation of the left C1 mass relative to the left C2 articular mass. There is associated prevertebral soft tissue swelling. There is associated edema and hemorrhage in the transverse ligament causing at least mild anterior canal stenosis at the C1 level. No additional cervical spine fracture. There is straightening of the cervical spine,  usually due to positioning and/or muscle spasm. There is severe degenerative disc disease throughout the cervical spine, most prominent at C3-4, C5-6 and C6-7 with ankylosis at C6-7. There is minimal 2 mm anterolisthesis at C2-3, minimal 2 mm retrolisthesis at C4-5 and mild 3 mm anterolisthesis at C7-T1, likely degenerative. There is moderate foraminal stenosis on the right at C2-3. There is mild foraminal stenosis on the left at C3-4 and on the left at C5-6. Visualized mastoid air cells appear clear. No evidence of intra-axial hemorrhage in the visualized brain. Visualized lung apices are clear. No appreciable cervical adenopathy. There is atherosclerotic calcification in the bilateral extracranial carotid arteries. Scout topogram demonstrates partially visualized multi lead left subclavian ICD and intact median sternotomy wires. IMPRESSION: 1. Comminuted mid anterior and posterior C1 arch fractures, which appear acute, with mild lateral subluxation of the lateral C1 masses relative to the lateral C2 masses bilaterally. 2. Prevertebral and transverse ligament edema/hemorrhage causing at least mild anterior canal stenosis at the C1 level. 3. Severe degenerative disc disease and minimal multilevel degenerative spondylolisthesis throughout the cervical spine. These results were called by telephone at the time of interpretation on 06/24/2015 at 2:24 pm to Dr. Joseph Berkshire , who verbally acknowledged these results. Electronically Signed   By: Ilona Sorrel M.D.   On: 06/24/2015 14:27    I have personally reviewed and evaluated these images and lab results as part of my medical decision-making.   EKG Interpretation None      MDM   Final diagnoses:  Fall  Closed fracture of first cervical vertebra, unspecified fracture morphology, initial encounter [facility])  Laceration    Patient presented to the ER after a ground-level fall. Patient reports that he was trying to change his shirt when he fell. He reports that he should get caught on his had any try to wean back on the wall to support himself, missed and fell to the ground. Patient complaining of severe neck pain arrival to the ER. He had a superficial laceration/skin tear on his forehead, but was not complaining of headache. He had normal neurologic function. Patient was sent to radiology for CT neck. Imaging reveals comminuted C1 fracture. Findings were discussed with Dr. Christella Noa, he confirmed that the patient requires aspirin collar and outpatient follow-up. There is no surgical intervention necessary.  Patient sent back to radiology for additional imaging including head CT. He was also sent for x-ray of lumbar spine and left finger. Laceration of finger was repaired.  Discussed briefly with Dr. Hulen Skains, on call for trauma. He did not feel that the patient required trauma surgery evaluation or admission, as he is already being followed by neurosurgery. Recommend a medicine admission for pain control.    Orpah Greek, MD 06/24/15 1600

## 2015-06-24 NOTE — ED Notes (Signed)
MD at bedside. 

## 2015-06-24 NOTE — ED Notes (Signed)
Dr. Betsey Holiday at bedside suturing finger.

## 2015-06-24 NOTE — ED Notes (Signed)
This Rn and Dr. Betsey Holiday at bedside and placed pt in aspen collar. No neuro deficits noted.

## 2015-06-24 NOTE — Consult Note (Signed)
Reason for Consult:C1  fracture Referring Physician: Adoni, Greenough is an 81 y.o. male.  HPI: whom fell while trying to put his shirt on today, lost his balance and fell striking his head. He had been seen in the [facility] on 10/13 for neck pain. At that visit he was deemed to be well enough, no imaging of his neck was performed. He was seen by cardiology and they felt his pacemaker was functioning well. Upon arrival to ED today a head ct revealed a C1 fracture. No neurologic deficits. Placed in an Aspen collar.   Past Medical History  Diagnosis Date  . CAD (coronary artery disease)     s/p CABG; s/p Pacemaker  . Systolic CHF with reduced left ventricular function, NYHA class 2 ([place])   . Ischemic cardiomyopathy     s/p ICD  . Hypertension   . Hyperlipidemia   . Gout   . Paroxysmal ventricular tachycardia ([place])   . Diverticulosis of colon   . AICD (automatic cardioverter/defibrillator) present   . Arthritis     "shoulders" (10/31/2014)  . On home oxygen therapy     "2L prn" (10/31/2014)  . Shortness of breath dyspnea     with exertion  . GERD (gastroesophageal reflux disease)   . SBO (small bowel obstruction) ([place]) 10/31/2014    Past Surgical History  Procedure Laterality Date  . Coronary artery bypass graft  03/2003    CABG X3/notes 01/18/2011  . Bowel resection    . Coronary angioplasty with stent placement  04/2011    2 stents/notes 05/03/2011  . Implantable cardioverter defibrillator (icd) generator change Left 10/12/2011    Procedure: ICD GENERATOR CHANGE;  Surgeon: Evans Lance, MD;  Location: [facility] CATH LAB;  Service: Cardiovascular;  Laterality: Left;  . Pacemaker revision N/A 10/12/2011    Procedure: PACEMAKER REVISION;  Surgeon: Evans Lance, MD;  Location: [facility] CATH LAB;  Service: Cardiovascular;  Laterality: N/A;  . Bi-ventricular implantable cardioverter defibrillator upgrade N/A 01/22/2014    Procedure: BI-VENTRICULAR IMPLANTABLE CARDIOVERTER DEFIBRILLATOR UPGRADE;   Surgeon: Evans Lance, MD;  Location: [facility] CATH LAB;  Service: Cardiovascular;  Laterality: N/A;  . Tonsillectomy    . Cholecystectomy    . Cataract extraction w/ intraocular lens  implant, bilateral Bilateral   . Tee with cardioversion  03/2003    Archie Endo 01/18/2011  . Cardiac defibrillator placement  07/2003    Archie Endo 01/18/2011  . Cardiac catheterization  03/2011    Archie Endo 01/18/2011  . Eye surgery Bilateral     [place]  . Cataract extraction w/phaco Right 12/17/2014    Procedure: PHACOEMULSIFICATION CATARACT EXTRACTION WITH IOL IMPLANT RIGHT EYE;  Surgeon: Marylynn Pearson, MD;  Location: [place];  Service: Ophthalmology;  Laterality: Right;  . Pacemaker placement      No family history on file.  Social History:  reports that he has never smoked. He has quit using smokeless tobacco. His smokeless tobacco use included Chew. He reports that he drinks alcohol. He reports that he does not use illicit drugs.  Allergies: No Known Allergies  Medications: I have reviewed the patient's current medications.  Results for orders placed or performed during the hospital encounter of 06/24/15 (from the past 48 hour(s))  CBC with Differential/Platelet     Status: Abnormal   Collection Time: 06/24/15  3:00 PM  Result Value Ref Range   WBC 3.7 (L) 4.0 - 10.5 K/uL   RBC 2.82 (L) 4.22 - 5.81 MIL/uL   Hemoglobin  8.9 (L) 13.0 - 17.0 g/dL   HCT 26.4 (L) 39.0 - 52.0 %   MCV 93.6 78.0 - 100.0 fL   MCH 31.6 26.0 - 34.0 pg   MCHC 33.7 30.0 - 36.0 g/dL   RDW 14.5 11.5 - 15.5 %   Platelets 126 (L) 150 - 400 K/uL   Neutrophils Relative % 75 %   Neutro Abs 2.8 1.7 - 7.7 K/uL   Lymphocytes Relative 13 %   Lymphs Abs 0.5 (L) 0.7 - 4.0 K/uL   Monocytes Relative 8 %   Monocytes Absolute 0.3 0.1 - 1.0 K/uL   Eosinophils Relative 4 %   Eosinophils Absolute 0.1 0.0 - 0.7 K/uL   Basophils Relative 0 %   Basophils Absolute 0.0 0.0 - 0.1 K/uL  Basic metabolic panel     Status: Abnormal   Collection Time: 06/24/15   3:00 PM  Result Value Ref Range   Sodium 141 135 - 145 mmol/L   Potassium 4.0 3.5 - 5.1 mmol/L   Chloride 109 101 - 111 mmol/L   CO2 23 22 - 32 mmol/L   Glucose, Bld 130 (H) 65 - 99 mg/dL   BUN 16 6 - 20 mg/dL   Creatinine, Ser 1.24 0.61 - 1.24 mg/dL   Calcium 8.9 8.9 - 10.3 mg/dL   GFR calc non Af Amer 51 (L) >60 mL/min   GFR calc Af Amer 59 (L) >60 mL/min    Comment: (NOTE) The eGFR has been calculated using the CKD EPI equation. This calculation has not been validated in all clinical situations. eGFR's persistently <60 mL/min signify possible Chronic Kidney Disease.    Anion gap 9 5 - 15    Ct Cervical Spine Wo Contrast  06/24/2015  CLINICAL DATA:  Imbalance with fall. Neck pain radiating to the head. EXAM: CT CERVICAL SPINE WITHOUT CONTRAST TECHNIQUE: Multidetector CT imaging of the cervical spine was performed without intravenous contrast. Multiplanar CT image reconstructions were also generated. COMPARISON:  None. FINDINGS: There is a comminuted fracture of the mid anterior C1 arch. There is a comminuted fracture of the mid posterior C1 arch. There is 4 mm lateral subluxation of the right lateral C1 mass relative to the C2 lateral mass. There is 2 mm lateral subluxation of the left C1 mass relative to the left C2 articular mass. There is associated prevertebral soft tissue swelling. There is associated edema and hemorrhage in the transverse ligament causing at least mild anterior canal stenosis at the C1 level. No additional cervical spine fracture. There is straightening of the cervical spine, usually due to positioning and/or muscle spasm. There is severe degenerative disc disease throughout the cervical spine, most prominent at C3-4, C5-6 and C6-7 with ankylosis at C6-7. There is minimal 2 mm anterolisthesis at C2-3, minimal 2 mm retrolisthesis at C4-5 and mild 3 mm anterolisthesis at C7-T1, likely degenerative. There is moderate foraminal stenosis on the right at C2-3. There is mild  foraminal stenosis on the left at C3-4 and on the left at C5-6. Visualized mastoid air cells appear clear. No evidence of intra-axial hemorrhage in the visualized brain. Visualized lung apices are clear. No appreciable cervical adenopathy. There is atherosclerotic calcification in the bilateral extracranial carotid arteries. Scout topogram demonstrates partially visualized multi lead left subclavian ICD and intact median sternotomy wires. IMPRESSION: 1. Comminuted mid anterior and posterior C1 arch fractures, which appear acute, with mild lateral subluxation of the lateral C1 masses relative to the lateral C2 masses bilaterally. 2. Prevertebral and transverse ligament  edema/hemorrhage causing at least mild anterior canal stenosis at the C1 level. 3. Severe degenerative disc disease and minimal multilevel degenerative spondylolisthesis throughout the cervical spine. These results were called by telephone at the time of interpretation on 06/24/2015 at 2:24 pm to Dr. Joseph Berkshire , who verbally acknowledged these results. Electronically Signed   By: Ilona Sorrel M.D.   On: 06/24/2015 14:27    Review of Systems  Constitutional: Negative.   Eyes: Negative.   Respiratory: Negative.   Cardiovascular: Negative.   Gastrointestinal: Negative.   Genitourinary: Negative.   Musculoskeletal: Positive for falls and neck pain.  Skin: Negative.   Neurological: Negative.  Negative for loss of consciousness.  Endo/Heme/Allergies: Bruises/bleeds easily.  Psychiatric/Behavioral: Negative.    Blood pressure 139/59, pulse 67, temperature 98.1 F (36.7 C), temperature source Oral, resp. rate 16, height 5' 4" (1.626 m), weight 71.668 kg (158 lb), SpO2 97 %. Physical Exam  Constitutional: He is oriented to person, place, and time. He appears well-developed and well-nourished. No distress.  Blood stains over shirt, pants  HENT:  Laceration forehead  Eyes: Conjunctivae and EOM are normal. Pupils are equal, round,  and reactive to light.  Neck: No tracheal deviation present. No thyromegaly present.  In cervical collar  Cardiovascular: Normal rate, regular rhythm and normal heart sounds.   Respiratory: Effort normal and breath sounds normal.  GI: Soft. Bowel sounds are normal.  Musculoskeletal: He exhibits tenderness. He exhibits no edema.  Posterior neck pain  Neurological: He is alert and oriented to person, place, and time. He has normal strength and normal reflexes. No sensory deficit. He exhibits normal muscle tone. GCS eye subscore is 4. GCS verbal subscore is 5. GCS motor subscore is 6. He displays no Babinski's sign on the right side. He displays no Babinski's sign on the left side.  Left, and right  irregular pupils, reactive, full eom Gait not assessed Normal coordination in the upper extremities  Skin: Skin is warm and dry.  Psychiatric: He has a normal mood and affect. His behavior is normal. Judgment and thought content normal.    Assessment/Plan: Treated adequately with the Cervical Collar. Do not believe that the anterior abnormality in C1 is acute. The posterior arch fracture I do believe is real and warrants the collar. If he stays for pain control, the plan is for him to follow up with me in one month, the same plan if he is discharged from the ed. Head ct was ok also.   CABBELL,KYLE L   06/24/2015, 3:52 PM

## 2015-06-24 NOTE — ED Notes (Signed)
Admitting MD at bedside.

## 2015-06-25 DIAGNOSIS — S12001A Unspecified nondisplaced fracture of first cervical vertebra, initial encounter for closed fracture: Secondary | ICD-10-CM | POA: Diagnosis not present

## 2015-06-25 DIAGNOSIS — I472 Ventricular tachycardia, unspecified: Secondary | ICD-10-CM | POA: Insufficient documentation

## 2015-06-25 DIAGNOSIS — I5023 Acute on chronic systolic (congestive) heart failure: Secondary | ICD-10-CM | POA: Diagnosis not present

## 2015-06-25 DIAGNOSIS — I2510 Atherosclerotic heart disease of native coronary artery without angina pectoris: Secondary | ICD-10-CM | POA: Diagnosis not present

## 2015-06-25 DIAGNOSIS — I5040 Unspecified combined systolic (congestive) and diastolic (congestive) heart failure: Secondary | ICD-10-CM | POA: Diagnosis not present

## 2015-06-25 DIAGNOSIS — T148 Other injury of unspecified body region: Secondary | ICD-10-CM

## 2015-06-25 DIAGNOSIS — I5022 Chronic systolic (congestive) heart failure: Secondary | ICD-10-CM | POA: Diagnosis not present

## 2015-06-25 DIAGNOSIS — I482 Chronic atrial fibrillation: Secondary | ICD-10-CM | POA: Diagnosis not present

## 2015-06-25 DIAGNOSIS — S12000A Unspecified displaced fracture of first cervical vertebra, initial encounter for closed fracture: Secondary | ICD-10-CM | POA: Diagnosis not present

## 2015-06-25 DIAGNOSIS — I480 Paroxysmal atrial fibrillation: Secondary | ICD-10-CM | POA: Diagnosis not present

## 2015-06-25 DIAGNOSIS — I4891 Unspecified atrial fibrillation: Secondary | ICD-10-CM | POA: Diagnosis not present

## 2015-06-25 DIAGNOSIS — I459 Conduction disorder, unspecified: Secondary | ICD-10-CM | POA: Diagnosis not present

## 2015-06-25 DIAGNOSIS — E119 Type 2 diabetes mellitus without complications: Secondary | ICD-10-CM | POA: Diagnosis not present

## 2015-06-25 LAB — CBC
HEMATOCRIT: 26.5 % — AB (ref 39.0–52.0)
Hemoglobin: 8.7 g/dL — ABNORMAL LOW (ref 13.0–17.0)
MCH: 31.1 pg (ref 26.0–34.0)
MCHC: 32.8 g/dL (ref 30.0–36.0)
MCV: 94.6 fL (ref 78.0–100.0)
Platelets: 137 10*3/uL — ABNORMAL LOW (ref 150–400)
RBC: 2.8 MIL/uL — ABNORMAL LOW (ref 4.22–5.81)
RDW: 14.5 % (ref 11.5–15.5)
WBC: 3.1 10*3/uL — ABNORMAL LOW (ref 4.0–10.5)

## 2015-06-25 LAB — FOLATE: Folate: 48 ng/mL (ref >5.9)

## 2015-06-25 MED ORDER — ENOXAPARIN SODIUM 40 MG/0.4ML ~~LOC~~ SOLN
40.0000 mg | SUBCUTANEOUS | Status: DC
  Administered 2015-06-25 – 2015-06-28 (×3): 40 mg via SUBCUTANEOUS
  Filled 2015-06-25 (×3): qty 0.4

## 2015-06-25 MED ORDER — CARVEDILOL 6.25 MG PO TABS
6.2500 mg | ORAL_TABLET | Freq: Two times a day (BID) | ORAL | Status: DC
  Administered 2015-06-25 – 2015-06-28 (×6): 6.25 mg via ORAL
  Filled 2015-06-25 (×6): qty 1

## 2015-06-25 MED ORDER — FUROSEMIDE 10 MG/ML IJ SOLN
40.0000 mg | Freq: Once | INTRAMUSCULAR | Status: AC
  Administered 2015-06-25: 40 mg via INTRAVENOUS
  Filled 2015-06-25: qty 4

## 2015-06-25 NOTE — Progress Notes (Signed)
PATIENT DETAILS Name: [name] Age: 81 y.o. Sex: male Date of Birth: 1927/03/20 Admit Date: 06/24/2015 Admitting Physician Debbe Odea, MD [id number], Nicki Reaper, MD  Subjective: Some pain in his neck.  Assessment/Plan: Principal Problem: Suspected Syncope: Secondary to ventricular tachycardia-found on AICD interrogation-patient was defibrillated. Patient is a poor historian-and has no recollection of any shocks that were delivered. EPS consulted-recommendations are to increase beta blocker-defer starting antiarrhythmics to cardiology.  Active Problems: C1 cervical fracture: Secondary to fall due to above. Seen by neurosurgery, recommendations are to continue cervical collar and have outpatient neurosurgical follow-up. Continue supportive care with as needed Tylenol and narcotics.  Orthostatic hypotension: Difficult situation-cardiology contemplating increasing beta blocker-however orthostatic. We will place TED hose and follow.  Laceration of forehead and left ring finger: Sutured in the emergency room, given tdap-will need follow-up with PCP in one week to remove sutures.  Chronic systolic heart failure: Clinically compensated-continue Coreg and Aldactone. Suspect given orthostatic hypotension/soft BP-no room to add ACE inhibitor or more  Diuretics.  AICD in place: See above  History of atrial flutter: Previously on anticoagulation with Xarelto-apparently discontinued prior to this admission. Clearly not a candidate for anticoagulation given fall risk/C1 fracture. Continue Coreg.  Anemia: Appears chronic-but hemoglobin seems to be slightly usual baseline evidence of blood loss stable for outpatient workup.  Thrombocytopenia/leukopenia: Appears chronic-stable for outpatient workup  History of CAD: Status post CABG and PCI-continue aspirin, statin, Coreg  GERD: PPI  DNR/Home Hospice  Disposition: Remain inpatient-Home in 1-2 days  Antimicrobial  agents  See below  Anti-infectives    None      DVT Prophylaxis: Prophylactic Lovenox   Code Status:  DNR  Family Communication Gerda Diss over the phone  Procedures: None  CONSULTS:  cardiology  Time spent 30 minutes-Greater than 50% of this time was spent in counseling, explanation of diagnosis, planning of further management, and coordination of care.  MEDICATIONS: Scheduled Meds: . allopurinol  300 mg Oral Daily  . aspirin EC  81 mg Oral Daily  . atorvastatin  20 mg Oral Daily  . carvedilol  3.125 mg Oral BID  . escitalopram  10 mg Oral Daily  . famotidine  20 mg Oral Daily  . LORazepam  0.5 mg Oral BID  . olopatadine  1 drop Both Eyes BID  . pantoprazole  40 mg Oral Daily  . spironolactone  12.5 mg Oral Daily  . sucralfate  1 g Oral BID   Continuous Infusions:  PRN Meds:.alum & mag hydroxide-simeth, diphenhydrAMINE, HYDROcodone-acetaminophen, magnesium citrate, morphine injection, ondansetron **OR** ondansetron (ZOFRAN) IV, tiZANidine    PHYSICAL EXAM: Vital signs in last 24 hours: Filed Vitals:   06/24/15 1930 06/25/15 0055 06/25/15 0625 06/25/15 0940  BP: 134/76 121/62 125/57 91/66  Pulse: 75 69 61 61  Temp: 97.5 F (36.4 C) 97.8 F (36.6 C) 97.9 F (36.6 C) 97.6 F (36.4 C)  TempSrc: Oral Oral Oral Oral  Resp: 18 18 18 18   Height: 5\' 4"  (1.626 m)     Weight: 69.6 kg (153 lb 7 oz)     SpO2: 97% 98% 98%     Weight change:  Filed Weights   06/24/15 1150 06/24/15 1930  Weight: 71.668 kg (158 lb) 69.6 kg (153 lb 7 oz)   Body mass index is 26.32 kg/(m^2).   Gen Exam: Awake and alert with clear speech. Neck: Supple Cervical Collar in place Chest: B/L Clear.  CVS: S1 S2 Regular Abdomen: soft, BS +, non tender, non distended.  Extremities: no edema, lower extremities warm to touch. Neurologic: Non Focal.   Skin: No Rash.   Wounds: N/A.   Intake/Output from previous day:  Intake/Output Summary (Last 24 hours) at 06/25/15  1307 Last data filed at 06/25/15 0900  Gross per 24 hour  Intake    240 ml  Output    951 ml  Net   -711 ml     LAB RESULTS: CBC  Recent Labs Lab 06/24/15 1500 06/25/15 0407  WBC 3.7* 3.1*  HGB 8.9* 8.7*  HCT 26.4* 26.5*  PLT 126* 137*  MCV 93.6 94.6  MCH 31.6 31.1  MCHC 33.7 32.8  RDW 14.5 14.5  LYMPHSABS 0.5*  --   MONOABS 0.3  --   EOSABS 0.1  --   BASOSABS 0.0  --     Chemistries   Recent Labs Lab 06/24/15 1500  NA 141  K 4.0  CL 109  CO2 23  GLUCOSE 130*  BUN 16  CREATININE 1.24  CALCIUM 8.9    CBG:  Recent Labs Lab 06/24/15 2238  GLUCAP 219*    GFR Estimated Creatinine Clearance: 35.8 mL/min (by C-G formula based on Cr of 1.24).  Coagulation profile No results for input(s): INR, PROTIME in the last 168 hours.  Cardiac Enzymes No results for input(s): CKMB, TROPONINI, MYOGLOBIN in the last 168 hours.  Invalid input(s): CK  Invalid input(s): POCBNP No results for input(s): DDIMER in the last 72 hours. No results for input(s): HGBA1C in the last 72 hours. No results for input(s): CHOL, HDL, LDLCALC, TRIG, CHOLHDL, LDLDIRECT in the last 72 hours. No results for input(s): TSH, T4TOTAL, T3FREE, THYROIDAB in the last 72 hours.  Invalid input(s): FREET3  Recent Labs  06/24/15 2158  VITAMINB12 703  FOLATE 48.0  FERRITIN 165  TIBC 239*  IRON 54  RETICCTPCT 1.7   No results for input(s): LIPASE, AMYLASE in the last 72 hours.  Urine Studies No results for input(s): UHGB, CRYS in the last 72 hours.  Invalid input(s): UACOL, UAPR, USPG, UPH, UTP, UGL, UKET, UBIL, UNIT, UROB, ULEU, UEPI, UWBC, URBC, UBAC, CAST, UCOM, BILUA  MICROBIOLOGY: No results found for this or any previous visit (from the past 240 hour(s)).  RADIOLOGY STUDIES/RESULTS: Dg Chest 2 View  06/18/2015  CLINICAL DATA:  Right shoulder and neck pain EXAM: CHEST  2 VIEW COMPARISON:  10/31/2014 FINDINGS: Chronic cardiopericardial enlargement. ICD/pacer leads from the  left, with 2 right ventricular leads and a direct epicardial lead on the left in stable position. Unchanged aortic and hilar contours. Obscured right base, likely atelectasis given bandlike opacity below the right minor fissure. Small pleural effusions. No edema or pneumothorax. Severe right glenohumeral osteoarthritis with bilateral high humeral head, consistent with chronic rotator cuff tear. IMPRESSION: Trace effusions with mild right basilar atelectasis. Electronically Signed   By: Monte Fantasia M.D.   On: 06/18/2015 12:09   Dg Lumbar Spine 2-3 Views  06/24/2015  CLINICAL DATA:  Fall today. Known cervical spine fracture. Initial encounter. EXAM: LUMBAR SPINE - 2-3 VIEW COMPARISON:  CT abdomen and pelvis 10/31/2014 FINDINGS: There are 5 non rib-bearing lumbar type vertebral bodies. Grade 1 anterolisthesis is again seen of L4 on L5 and measures approximately 8 mm, unchanged and facet mediated. Severe L4-5 disc space narrowing and L5 superior endplate Schmorl's node deformity are similar to the prior CT. There is no evidence of acute fracture. Diffuse aortoiliac atherosclerotic calcification is  noted. Phleboliths are present in the pelvis. IMPRESSION: 1. No acute osseous abnormality identified in the lumbar spine. 2. Unchanged anterolisthesis of L4 on L5 and advanced L4-5 disc degeneration. Electronically Signed   By: Logan Bores M.D.   On: 06/24/2015 16:20   Ct Head Wo Contrast  06/24/2015  CLINICAL DATA:  Fall from standing while putting on a shirt. Lost balance and hit the floor, C1 fracture. Initial encounter. EXAM: CT HEAD WITHOUT CONTRAST TECHNIQUE: Contiguous axial images were obtained from the base of the skull through the vertex without intravenous contrast. COMPARISON:  CT cervical spine 06/24/2015 and CT head 01/16/2013. FINDINGS: No evidence of an acute infarct, acute hemorrhage, mass lesion, mass effect or hydrocephalus. Atrophy. Encephalomalacia in the left parietal lobe. Confluent  low-attenuation in the periventricular deep white matter. Small remote infarcts in the cerebellar hemispheres. C1 fracture is partially imaged. No additional evidence of an acute fracture. Mandibular condyles are located. Scattered mucosal thickening in the paranasal sinuses. IMPRESSION: 1. No acute intracranial abnormality. 2. C1 fracture partially imaged. Please see dedicated CT cervical spine dictated the same day for further details. 3. Atrophy, chronic microvascular white matter ischemic changes and remote infarcts. Electronically Signed   By: Lorin Picket M.D.   On: 06/24/2015 15:56   Ct Cervical Spine Wo Contrast  06/24/2015  CLINICAL DATA:  Imbalance with fall. Neck pain radiating to the head. EXAM: CT CERVICAL SPINE WITHOUT CONTRAST TECHNIQUE: Multidetector CT imaging of the cervical spine was performed without intravenous contrast. Multiplanar CT image reconstructions were also generated. COMPARISON:  None. FINDINGS: There is a comminuted fracture of the mid anterior C1 arch. There is a comminuted fracture of the mid posterior C1 arch. There is 4 mm lateral subluxation of the right lateral C1 mass relative to the C2 lateral mass. There is 2 mm lateral subluxation of the left C1 mass relative to the left C2 articular mass. There is associated prevertebral soft tissue swelling. There is associated edema and hemorrhage in the transverse ligament causing at least mild anterior canal stenosis at the C1 level. No additional cervical spine fracture. There is straightening of the cervical spine, usually due to positioning and/or muscle spasm. There is severe degenerative disc disease throughout the cervical spine, most prominent at C3-4, C5-6 and C6-7 with ankylosis at C6-7. There is minimal 2 mm anterolisthesis at C2-3, minimal 2 mm retrolisthesis at C4-5 and mild 3 mm anterolisthesis at C7-T1, likely degenerative. There is moderate foraminal stenosis on the right at C2-3. There is mild foraminal stenosis  on the left at C3-4 and on the left at C5-6. Visualized mastoid air cells appear clear. No evidence of intra-axial hemorrhage in the visualized brain. Visualized lung apices are clear. No appreciable cervical adenopathy. There is atherosclerotic calcification in the bilateral extracranial carotid arteries. Scout topogram demonstrates partially visualized multi lead left subclavian ICD and intact median sternotomy wires. IMPRESSION: 1. Comminuted mid anterior and posterior C1 arch fractures, which appear acute, with mild lateral subluxation of the lateral C1 masses relative to the lateral C2 masses bilaterally. 2. Prevertebral and transverse ligament edema/hemorrhage causing at least mild anterior canal stenosis at the C1 level. 3. Severe degenerative disc disease and minimal multilevel degenerative spondylolisthesis throughout the cervical spine. These results were called by telephone at the time of interpretation on 06/24/2015 at 2:24 pm to Dr. Joseph Berkshire , who verbally acknowledged these results. Electronically Signed   By: Ilona Sorrel M.D.   On: 06/24/2015 14:27   Dg Finger Ring Left  06/24/2015  CLINICAL DATA:  Left fourth finger laceration after fall. EXAM: LEFT RING FINGER 2+V COMPARISON:  None. FINDINGS: No fracture, dislocation or suspicious focal osseous lesion. Joint spaces appear normal. There are mild curvilinear soft tissue calcifications along the radial aspect of the fourth finger. IMPRESSION: 1. No fracture or malalignment in the left fourth finger. 2. Mild curvilinear soft tissue calcifications along the radial aspect of the left fourth finger, nonspecific, possibly due to gout or an inflammatory condition such as polymyositis/dermatomyositis. Electronically Signed   By: Ilona Sorrel M.D.   On: 06/24/2015 16:17    Oren Binet, MD  [facility] Pager:[phone]  If 7PM-7AM, please contact night-coverage [email or web address] Password [email or web address] 06/25/2015, 1:07 PM

## 2015-06-25 NOTE — Evaluation (Signed)
Physical Therapy Evaluation Patient Details Name: [name] MRN: [id number] DOB: [birth year] Today's Date: 06/25/2015   History of Present Illness  Pt is an 81 y/o male with a PMH that includes HTN, DM, ischemic cardiomyopathy s/p ICD (EF 25%). He presents s/p mechanical fall and C1 fracture.  Clinical Impression  Pt admitted with above diagnosis. Pt currently with functional limitations due to the deficits listed below (see PT Problem List). At the time of PT eval pt was able to transfer bed to chair with min guard assist. Recommending OT consult as it appears pt is having difficulties with ADL's and fell trying to dress himself. This pt will benefit from skilled PT to increase their independence and safety with mobility to allow discharge to the venue listed below.       Follow Up Recommendations Home health PT;Supervision for mobility/OOB (Home health aide for ADL's may be appropriate. )    Equipment Recommendations  None recommended by PT    Recommendations for Other Services OT consult     Precautions / Restrictions Precautions Precautions: Fall;Cervical Required Braces or Orthoses: Cervical Brace Cervical Brace: Hard collar;At all times Restrictions Weight Bearing Restrictions: No      Mobility  Bed Mobility Overal bed mobility: Needs Assistance Bed Mobility: Supine to Sit     Supine to sit: Supervision;HOB elevated     General bed mobility comments: No physical assist. Pt able to transition to EOB quickly with supervision for safety.   Transfers Overall transfer level: Needs assistance Equipment used: None Transfers: Sit to/from [employer] Sit to Stand: Supervision   Squat pivot transfers: Min guard     General transfer comment: Pt impulsively initiating transfer to standing and over to the recliner chair. Close guard for safety. Trunk flexed and in more of a squat position during transition to chair.   Ambulation/Gait              General Gait Details: Gait training deferred. Pt not interested in walking with therapy at this time as breakfast had just arrived.   Stairs            Wheelchair Mobility    Modified Rankin (Stroke Patients Only)       Balance Overall balance assessment: Needs assistance;History of Falls Sitting-balance support: Feet supported;No upper extremity supported Sitting balance-Leahy Scale: Fair     Standing balance support: No upper extremity supported;During functional activity Standing balance-Leahy Scale: Poor Standing balance comment: reaching for outside stability during transfer to chair.                              Pertinent Vitals/Pain Pain Assessment: Faces Faces Pain Scale: Hurts a little bit Pain Location: Neck Pain Descriptors / Indicators: Grimacing;Guarding Pain Intervention(s): Limited activity within patient's tolerance;Monitored during session;Repositioned    Home Living Family/patient expects to be discharged to:: Private residence Living Arrangements: Alone Available Help at Discharge: Family;Friend(s);Available PRN/intermittently Type of Home: Apartment Home Access: Level entry     Home Layout: One level Home Equipment: Wheelchair - manual;Shower seat;Cane - single point;Bedside commode;Grab bars - tub/shower;Walker - 4 wheels Additional Comments: takes Meals on Wheels 5d/week    Prior Function Level of Independence: Independent with assistive device(s)         Comments: SPC to ambulate. Was sponge bathing.     Hand Dominance   Dominant Hand: Right    Extremity/Trunk Assessment   Upper Extremity Assessment: Defer to OT  evaluation           Lower Extremity Assessment: Generalized weakness      Cervical / Trunk Assessment: Other exceptions  Communication   Communication: No difficulties  Cognition Arousal/Alertness: Awake/alert Behavior During Therapy: WFL for tasks assessed/performed Overall Cognitive Status:  Within Functional Limits for tasks assessed                      General Comments      Exercises        Assessment/Plan    PT Assessment Patient needs continued PT services  PT Diagnosis Difficulty walking;Acute pain   PT Problem List Decreased strength;Decreased range of motion;Decreased activity tolerance;Decreased balance;Decreased mobility;Decreased knowledge of use of DME;Decreased safety awareness;Decreased knowledge of precautions;Pain  PT Treatment Interventions DME instruction;Gait training;Stair training;Functional mobility training;Therapeutic activities;Therapeutic exercise;Neuromuscular re-education;Patient/family education   PT Goals (Current goals can be found in the Care Plan section) Acute Rehab PT Goals Patient Stated Goal: Return home but not today PT Goal Formulation: With patient Time For Goal Achievement: 07/02/15 Potential to Achieve Goals: Good    Frequency Min 3X/week   Barriers to discharge Decreased caregiver support Pt lives alone    Co-evaluation               End of Session Equipment Utilized During Treatment: Cervical collar Activity Tolerance: Patient tolerated treatment well Patient left: in chair;with call bell/phone within reach;with chair alarm set Nurse Communication: Mobility status    Functional Assessment Tool Used: Clinical judgement Functional Limitation: Mobility: Walking and moving around Mobility: Walking and Moving Around Current Status [phone]): At least 1 percent but less than 20 percent impaired, limited or restricted Mobility: Walking and Moving Around Goal Status [phone]): At least 1 percent but less than 20 percent impaired, limited or restricted    Time: 0815-0832 PT Time Calculation (min) (ACUTE ONLY): 17 min   Charges:   PT Evaluation $Initial PT Evaluation Tier I: 1 Procedure     PT G Codes:   PT G-Codes **NOT FOR INPATIENT CLASS** Functional Assessment Tool Used: Clinical judgement Functional  Limitation: Mobility: Walking and moving around Mobility: Walking and Moving Around Current Status ([id number]): At least 1 percent but less than 20 percent impaired, limited or restricted Mobility: Walking and Moving Around Goal Status [phone]): At least 1 percent but less than 20 percent impaired, limited or restricted    Rolinda Roan 06/25/2015, 8:50 AM   Rolinda Roan, PT, DPT Acute Rehabilitation Services Pager: [phone]

## 2015-06-25 NOTE — Progress Notes (Addendum)
Occupational Therapy Evaluation Patient Details Name: [name] MRN: [id number] DOB: 1927-04-10 Today's Date: 06/25/2015    History of Present Illness Pt is an 81 y/o male with a PMH that includes HTN, DM, ischemic cardiomyopathy s/p ICD (EF 25%). He presents s/p mechanical fall and C1 fracture.   Clinical Impression   Pt admitted with the above diagnoses and presents with below problem list. Pt will benefit from continued acute OT to address the below listed deficits and maximize independence with BADLs prior to d/c home with home health services. PTA pt was mod I with most ADLs. Pt reports [place] aide 3x per week who assisted minimally with bathing. Pt presents with decreased balance and some impaired cognition. No family present to compare to baseline though pt himself verbalized concern about cognition when asked. Session details below. OT to continue to follow acutely. Need clarification on whether pt can remove collar to shave and shower.     Follow Up Recommendations  Home health OT;Supervision - Intermittent;Other (comment) (OOB/mobility; would benefit from maximum [facility] services)    Equipment Recommendations  None recommended by OT    Recommendations for Other Services       Precautions / Restrictions Precautions Precautions: Fall;Cervical Required Braces or Orthoses: Cervical Brace Cervical Brace: Hard collar;At all times Restrictions Weight Bearing Restrictions: No      Mobility Bed Mobility Overal bed mobility: Needs Assistance Bed Mobility: Sidelying to Sit;Rolling;Sit to Sidelying Rolling: Min assist Sidelying to sit: Min assist     Sit to sidelying: Min assist General bed mobility comments: Min A to roll to side. Pt with noted difficulty sequencing movements. After educating on log roll technique to come to EOB, with pt in sidelying position cued to bring feet forward to EOB with pt noted to move feet backwards. Physical assist to power up  trunk  Transfers Overall transfer level: Needs assistance Equipment used: Rolling walker (2 wheeled) Transfers: Sit to/from Stand Sit to Stand: Min guard;Min assist         General transfer comment: Impulsive. Min A during toilet transfer. Cues for technique with rw. Multiple cues for hand placement.     Balance Overall balance assessment: Needs assistance;History of Falls Sitting-balance support: No upper extremity supported;Feet supported Sitting balance-Leahy Scale: Fair     Standing balance support: Bilateral upper extremity supported;During functional activity Standing balance-Leahy Scale: Poor Standing balance comment: external support for balance                            ADL Overall ADL's : Needs assistance/impaired Eating/Feeding: Set up;Sitting   Grooming: Wash/dry hands;Min guard;Minimal assistance;Standing Grooming Details (indicate cue type and reason): min A for dynamic balance Upper Body Bathing: Sitting;Minimal assitance   Lower Body Bathing: Moderate assistance;Sit to/from stand   Upper Body Dressing : Minimal assistance;Sitting   Lower Body Dressing: Moderate assistance;Sit to/from stand   Toilet Transfer: Minimal assistance;Ambulation;RW (BSC over toilet)   Toileting- Clothing Manipulation and Hygiene: Sit to/from stand;Moderate assistance     Tub/Shower Transfer Details (indicate cue type and reason): sponge bath at baseline Functional mobility during ADLs: Minimal assistance;Min guard;Rolling walker General ADL Comments: Pt moving impulsively requiring min A for toilet transfers. Able to access feet in sitting position for LB bathing and dressing. Educated on compensatory techniques. Cueing for technique with rw and for sequencing at times.      Vision     Perception     Praxis  Pertinent Vitals/Pain Pain Assessment: Faces Faces Pain Scale: Hurts little more Pain Location: neck, top of head at site of injury Pain  Descriptors / Indicators: Grimacing Pain Intervention(s): Limited activity within patient's tolerance;Monitored during session;Repositioned     Hand Dominance Right   Extremity/Trunk Assessment Upper Extremity Assessment Upper Extremity Assessment: Generalized weakness   Lower Extremity Assessment Lower Extremity Assessment: Defer to PT evaluation       Communication Communication Communication: No difficulties   Cognition Arousal/Alertness: Awake/alert Behavior During Therapy: WFL for tasks assessed/performed;Impulsive Overall Cognitive Status: Impaired/Different from baseline Area of Impairment: Orientation;Memory;Safety/judgement;Problem solving Orientation Level: Place   Memory: Decreased recall of precautions;Decreased short-term memory   Safety/Judgement: Decreased awareness of safety;Decreased awareness of deficits   Problem Solving: Slow processing;Difficulty sequencing General Comments: Pt confused about presence of visitor in room. Pt confusing TV audio with visitor and trying to converse. Explained to pt that visitors had left, pt noted to reference "that guy in the room" later in session as well. "I thought you were talking to him." Pt with confusion about where he was with cueing pt able to identify that he is in the hospital believing it to be [facility]. When asked if he felt his thinking is clear or not pt reported he feels he is having difficulty with cognition. Encouraged pt to slow his pace with ADLs to decrease impulsive, quick movements and promote safety and adherance to precautions with functional tasks.    General Comments       Exercises       Shoulder Instructions      Home Living Family/patient expects to be discharged to:: Private residence Living Arrangements: Alone Available Help at Discharge: Family;Friend(s);Available PRN/intermittently Type of Home: Apartment Home Access: Level entry     Home Layout: One level     Bathroom  Shower/Tub: Tub/shower unit;Curtain Shower/tub characteristics: Architectural technologist: Standard Bathroom Accessibility: Yes How Accessible: Accessible via walker Home Equipment: Wheelchair - manual;Shower seat;Cane - single point;Bedside commode;Grab bars - tub/shower;Walker - 4 wheels   Additional Comments: takes Meals on Wheels 5d/week      Prior Functioning/Environment Level of Independence: Independent with assistive device(s);Needs assistance  Gait / Transfers Assistance Needed: SPC ADL's / Homemaking Assistance Needed: pt reports [place] aide 3x per week who assisted minimally with bathing   Comments: SPC to ambulate. Was sponge bathing.    OT Diagnosis: Generalized weakness;Cognitive deficits;Acute pain   OT Problem List: Impaired balance (sitting and/or standing);Decreased cognition;Decreased safety awareness;Decreased knowledge of use of DME or AE;Decreased knowledge of precautions;Pain   OT Treatment/Interventions: Self-care/ADL training;DME and/or AE instruction;Therapeutic activities;Cognitive remediation/compensation;Patient/family education;Balance training    OT Goals(Current goals can be found in the care plan section) Acute Rehab OT Goals Patient Stated Goal: Return home but not today OT Goal Formulation: With patient Time For Goal Achievement: 07/02/15 Potential to Achieve Goals: Good ADL Goals Pt Will Perform Grooming: with modified independence;standing Pt Will Perform Upper Body Bathing: sitting;with min assist Pt Will Perform Lower Body Bathing: sit to/from stand;with supervision Pt Will Perform Upper Body Dressing: with modified independence;sitting Pt Will Perform Lower Body Dressing: with modified independence;sit to/from stand Pt Will Transfer to Toilet: with modified independence;ambulating (3n1 over toilet) Pt Will Perform Toileting - Clothing Manipulation and hygiene: with modified independence;sitting/lateral leans;sit to/from stand Additional ADL Goal  #1: Pt will complete bed mobility at mod I level to prepare for OOB ADLs.   OT Frequency: Min 3X/week   Barriers to D/C: Decreased caregiver support  only PRN help  at d/c       Co-evaluation              End of Session Equipment Utilized During Treatment: Gait belt;Rolling walker;Cervical collar Nurse Communication: Other (comment) (cognition)  Activity Tolerance: Patient tolerated treatment well Patient left: in bed;with call bell/phone within reach;with bed alarm set;with SCD's reapplied   Time: 1400-1420 OT Time Calculation (min): 20 min Charges:  OT General Charges $OT Visit: 1 Procedure OT Evaluation $Initial OT Evaluation Tier I: 1 Procedure G-Codes: OT G-codes **NOT FOR INPATIENT CLASS** Functional Assessment Tool Used: clinical judgement Functional Limitation: Self care Self Care Current Status (G9924): At least 1 percent but less than 20 percent impaired, limited or restricted Self Care Goal Status (Q6834): At least 1 percent but less than 20 percent impaired, limited or restricted  Hortencia Pilar 06/25/2015, 2:46 PM

## 2015-06-25 NOTE — Care Management Note (Signed)
Case Management Note  Patient Details  Name: [name] MRN: [id number] Date of Birth: 29-Sep-1926  Subjective/Objective:                    Action/Plan: Patient is currently active with [facility]. CM contacted [facility] to verify information and inform them of patient's admission.  Per attending MD, plan is to discharge patient home tomorrow (06/25/15) with hospice services as well as palliative PT.  Orders were faxed to [phone], attention Helene Kelp.  Discharge summary will be faxed once available.  Expected Discharge Date:                  Expected Discharge Plan:  Home w Hospice Care  In-House Referral:     Discharge planning Services  CM Consult  Post Acute Care Choice:    Choice offered to:     DME Arranged:    DME Agency:     HH Arranged:  PT, Nurse's Aide [place] Agency:  [facility]  Status of Service:  Completed, signed off  Medicare Important Message Given:    Date Medicare IM Given:    Medicare IM give by:    Date Additional Medicare IM Given:    Additional Medicare Important Message give by:     If discussed at [place] of Stay Meetings, dates discussed:    Additional Comments:  Rolm Baptise, RN 06/25/2015, 1:55 PM

## 2015-06-25 NOTE — Consult Note (Signed)
ELECTROPHYSIOLOGY CONSULT NOTE    Patient ID: [name] MRN: [id number], DOB/AGE: 01-28-1927 81 y.o.  Admit date: 06/24/2015 Date of Consult: 06/25/2015   Primary Physician: [name], MD Primary Cardiologist: Dr. [name] Electrophysiologist: Dr. [name]  Reason for Consultation: Syncope   HPI: [name] is a 81 y.o. male admitted yesterday with neck pain and head laceration after what he felt was a fall.  He was found with a C1 fracture and head laceration.  He has history of CAD, ICM, VT, PAFlutter with an ICD.  He denies any associated CP with the event or otherwise.  Satets he was getting ready to go to the doctor when he was putting on his shirt he went to lean up on the wall for support and fell, his impression was "all of a sudden I fell", he hit his head and was bleeding and had neck pain so he was brought to the ER.  The patient did not feel like he fainted though after further discussion he does not recall hitting  His head or getting shocked by his device.  Our office received a transmission this morning from the patient's remote monitoring that he had been shocked for an arrhythmia in the VF zone and when attempting to f/u with him, it was noted he had been admitted to the hospital.  Janae Bridgeman of pain from his injuries, he states he feels well, no CP, palpitations, he denies rest SOB with some occasional DOE< but states he is able to lye flat comfortably to sleep.   Past Medical History  Diagnosis Date  . CAD (coronary artery disease)     s/p CABG; s/p Pacemaker  . Systolic CHF with reduced left ventricular function, NYHA class 2 ([place])   . Ischemic cardiomyopathy     s/p ICD  . Hypertension   . Hyperlipidemia   . Gout   . Paroxysmal ventricular tachycardia ([place])   . Diverticulosis of colon   . AICD (automatic cardioverter/defibrillator) present   . Arthritis     "shoulders" (10/31/2014)  . On home oxygen therapy     "2L prn" (10/31/2014)  . Shortness of  breath dyspnea     with exertion  . GERD (gastroesophageal reflux disease)   . SBO (small bowel obstruction) ([place]) 10/31/2014     Surgical History:  Past Surgical History  Procedure Laterality Date  . Coronary artery bypass graft  03/2003    CABG X3/notes 01/18/2011  . Bowel resection    . Coronary angioplasty with stent placement  04/2011    2 stents/notes 05/03/2011  . Implantable cardioverter defibrillator (icd) generator change Left 10/12/2011    Procedure: ICD GENERATOR CHANGE;  Surgeon: Evans Lance, MD;  Location: [facility] CATH LAB;  Service: Cardiovascular;  Laterality: Left;  . Pacemaker revision N/A 10/12/2011    Procedure: PACEMAKER REVISION;  Surgeon: Evans Lance, MD;  Location: [facility] CATH LAB;  Service: Cardiovascular;  Laterality: N/A;  . Bi-ventricular implantable cardioverter defibrillator upgrade N/A 01/22/2014    Procedure: BI-VENTRICULAR IMPLANTABLE CARDIOVERTER DEFIBRILLATOR UPGRADE;  Surgeon: Evans Lance, MD;  Location: [facility] CATH LAB;  Service: Cardiovascular;  Laterality: N/A;  . Tonsillectomy    . Cholecystectomy    . Cataract extraction w/ intraocular lens  implant, bilateral Bilateral   . Tee with cardioversion  03/2003    Archie Endo 01/18/2011  . Cardiac defibrillator placement  07/2003    Archie Endo 01/18/2011  . Cardiac catheterization  03/2011    Archie Endo 01/18/2011  . Eye  surgery Bilateral     [place]  . Cataract extraction w/phaco Right 12/17/2014    Procedure: PHACOEMULSIFICATION CATARACT EXTRACTION WITH IOL IMPLANT RIGHT EYE;  Surgeon: Marylynn Pearson, MD;  Location: [place];  Service: Ophthalmology;  Laterality: Right;  . Pacemaker placement       Prescriptions prior to admission  Medication Sig Dispense Refill Last Dose  . acetaminophen (TYLENOL) 500 MG tablet Take 500 mg by mouth every 6 (six) hours as needed for mild pain, moderate pain or headache.   06/24/2015 at Unknown time  . allopurinol (ZYLOPRIM) 300 MG tablet Take 300 mg by mouth daily.     06/23/2015 at Unknown  time  . atorvastatin (LIPITOR) 20 MG tablet Take 20 mg by mouth daily.     06/23/2015 at Unknown time  . carvedilol (COREG) 6.25 MG tablet Take 3.125 mg by mouth 2 (two) times daily.    06/23/2015 at 1800  . docusate sodium (COLACE) 100 MG capsule Take 100 mg by mouth 2 (two) times daily.   06/23/2015 at Unknown time  . esomeprazole (NEXIUM) 20 MG capsule Take 20 mg by mouth daily at 12 noon.   06/23/2015 at Unknown time  . famotidine (PEPCID) 20 MG tablet Take 20 mg by mouth daily.    06/23/2015 at Unknown time  . furosemide (LASIX) 20 MG tablet Take 20 mg by mouth daily.   06/23/2015  . HYDROcodone-acetaminophen (NORCO/VICODIN) 5-325 MG tablet Take 1-2 tablets by mouth every 6 (six) hours as needed for moderate pain.   06/23/2015 at Unknown time  . iron polysaccharides (NIFEREX) 150 MG capsule Take 150 mg by mouth daily.   06/23/2015  . NITROSTAT 0.4 MG SL tablet Place 0.4 mg under the tongue every 5 (five) minutes as needed for chest pain (MAX 3 TABLETS).    over 30 days  . OXYGEN Inhale 3 L into the lungs daily as needed (shortness of breath).     Marland Kitchen PATADAY 0.2 % SOLN Place 1 drop into both eyes daily.    06/23/2015 at Unknown time  . RA ASPIRIN ADULT LOW STRENGTH 81 MG EC tablet Take 1 tablet by mouth daily.  0 06/23/2015 at Unknown time  . sucralfate (CARAFATE) 1 G tablet Take 1 g by mouth 2 (two) times daily.    06/23/2015  . tiZANidine (ZANAFLEX) 2 MG tablet Take 1 tablet (2 mg total) by mouth every 8 (eight) hours as needed for muscle spasms. 30 tablet 0 Past Month at Unknown time    Inpatient Medications:  . allopurinol  300 mg Oral Daily  . aspirin EC  81 mg Oral Daily  . atorvastatin  20 mg Oral Daily  . carvedilol  3.125 mg Oral BID  . escitalopram  10 mg Oral Daily  . famotidine  20 mg Oral Daily  . LORazepam  0.5 mg Oral BID  . olopatadine  1 drop Both Eyes BID  . pantoprazole  40 mg Oral Daily  . spironolactone  12.5 mg Oral Daily  . sucralfate  1 g Oral BID     Allergies: No Known Allergies  Social History   Social History  . Marital Status: Divorced    Spouse Name: N/A  . Number of Children: N/A  . Years of Education: N/A   Occupational History  . Not on file.   Social History Main Topics  . Smoking status: Never Smoker   . Smokeless tobacco: Former Systems developer    Types: Chew     Comment: no chew  in over 3 years  . Alcohol Use: Yes     Comment: "might take a drink during the holidays"  . Drug Use: No  . Sexual Activity: No   Other Topics Concern  . Not on file   Social History Narrative     History reviewed. No pertinent family history.   Review of Systems: General: No chills, fever, night sweats or weight changes  Cardiovascular:  No chest pain, minimal dyspnea on exertion, unchanged from his baseline, no edema, orthopnea, palpitations, paroxysmal nocturnal dyspnea Dermatological: No rash, lesions or masses Respiratory: No cough, dyspnea Urologic: No hematuria, dysuria Abdominal: No nausea, vomiting, diarrhea, bright red blood per rectum, melena, or hematemesis Neurologic: No visual changes, weakness, changes in mental status All other systems reviewed and are otherwise negative except as noted above.  Physical Exam: Filed Vitals:   06/24/15 1930 06/25/15 0055 06/25/15 0625 06/25/15 0940  BP: 134/76 121/62 125/57 91/66  Pulse: 75 69 61 61  Temp: 97.5 F (36.4 C) 97.8 F (36.6 C) 97.9 F (36.6 C) 97.6 F (36.4 C)  TempSrc: Oral Oral Oral Oral  Resp: 18 18 18 18   Height: 5\' 4"  (1.626 m)     Weight: 153 lb 7 oz (69.6 kg)     SpO2: 97% 98% 98%     GEN- The patient looks his age, thin body habitus, alert and oriented x 3 today.   HEENT: normocephalic, he had a clean, dry, dressing to the forehead, sclera clear, conjunctiva pink; hearing intact;  neck : C-collar in place Lungs- Clear to ausculation bilaterally, normal work of breathing.  No wheezes, rales, rhonchi Heart- Regular rate and rhythm, no significant  murmurs, no rubs or gallops GI- soft, non-tender, non-distended, bowel sounds present, no hepatosplenomegaly Extremities- no clubbing, cyanosis, or edema MS- no significant deformity or atrophy Skin- warm and dry, no rash or lesion Psych- euthymic mood, full affect Neuro- no gross deficits observed  Labs:   Lab Results  Component Value Date   WBC 3.1* 06/25/2015   HGB 8.7* 06/25/2015   HCT 26.5* 06/25/2015   MCV 94.6 06/25/2015   PLT 137* 06/25/2015    Recent Labs Lab 06/24/15 1500  NA 141  K 4.0  CL 109  CO2 23  BUN 16  CREATININE 1.24  CALCIUM 8.9  GLUCOSE 130*      Radiology/Studies:   Ct Head Wo Contrast 06/24/2015  CLINICAL DATA:  Fall from standing while putting on a shirt. Lost balance and hit the floor, C1 fracture. Initial encounter. EXAM: CT HEAD WITHOUT CONTRAST TECHNIQUE: Contiguous axial images were obtained from the base of the skull through the vertex without intravenous contrast. COMPARISON:  CT cervical spine 06/24/2015 and CT head 01/16/2013. FINDINGS: No evidence of an acute infarct, acute hemorrhage, mass lesion, mass effect or hydrocephalus. Atrophy. Encephalomalacia in the left parietal lobe. Confluent low-attenuation in the periventricular deep white matter. Small remote infarcts in the cerebellar hemispheres. C1 fracture is partially imaged. No additional evidence of an acute fracture. Mandibular condyles are located. Scattered mucosal thickening in the paranasal sinuses. IMPRESSION: 1. No acute intracranial abnormality. 2. C1 fracture partially imaged. Please see dedicated CT cervical spine dictated the same day for further details. 3. Atrophy, chronic microvascular white matter ischemic changes and remote infarcts. Electronically Signed   By: Lorin Picket M.D.   On: 06/24/2015 15:56   Ct Cervical Spine Wo Contrast 06/24/2015  CLINICAL DATA:  Imbalance with fall. Neck pain radiating to the head. EXAM: CT CERVICAL SPINE  WITHOUT CONTRAST TECHNIQUE:  Multidetector CT imaging of the cervical spine was performed without intravenous contrast. Multiplanar CT image reconstructions were also generated. COMPARISON:  None. FINDINGS: There is a comminuted fracture of the mid anterior C1 arch. There is a comminuted fracture of the mid posterior C1 arch. There is 4 mm lateral subluxation of the right lateral C1 mass relative to the C2 lateral mass. There is 2 mm lateral subluxation of the left C1 mass relative to the left C2 articular mass. There is associated prevertebral soft tissue swelling. There is associated edema and hemorrhage in the transverse ligament causing at least mild anterior canal stenosis at the C1 level. No additional cervical spine fracture. There is straightening of the cervical spine, usually due to positioning and/or muscle spasm. There is severe degenerative disc disease throughout the cervical spine, most prominent at C3-4, C5-6 and C6-7 with ankylosis at C6-7. There is minimal 2 mm anterolisthesis at C2-3, minimal 2 mm retrolisthesis at C4-5 and mild 3 mm anterolisthesis at C7-T1, likely degenerative. There is moderate foraminal stenosis on the right at C2-3. There is mild foraminal stenosis on the left at C3-4 and on the left at C5-6. Visualized mastoid air cells appear clear. No evidence of intra-axial hemorrhage in the visualized brain. Visualized lung apices are clear. No appreciable cervical adenopathy. There is atherosclerotic calcification in the bilateral extracranial carotid arteries. Scout topogram demonstrates partially visualized multi lead left subclavian ICD and intact median sternotomy wires. IMPRESSION: 1. Comminuted mid anterior and posterior C1 arch fractures, which appear acute, with mild lateral subluxation of the lateral C1 masses relative to the lateral C2 masses bilaterally. 2. Prevertebral and transverse ligament edema/hemorrhage causing at least mild anterior canal stenosis at the C1 level. 3. Severe degenerative disc  disease and minimal multilevel degenerative spondylolisthesis throughout the cervical spine. These results were called by telephone at the time of interpretation on 06/24/2015 at 2:24 pm to Dr. Joseph Berkshire , who verbally acknowledged these results. Electronically Signed   By: Ilona Sorrel M.D.   On: 06/24/2015 14:27   07/28/15; Echocardiogram: Study Conclusions - Left ventricle: The cavity size was mildly dilated. Systolic function was severely reduced. The estimated ejection fraction was in the range of 20% to 25%. Diffuse hypokinesis. - Mitral valve: There was mild regurgitation. - Atrial septum: No defect or patent foramen ovale was identified. - Tricuspid valve: There was moderate regurgitation.  EKG: SR, V paced  TELEMETRY: not on telemetry, he is A sensing, Bive pacing on his device check  ICD interrogation: Normal device function, the patient had VT rate 220 was treated with ATP therapy that failed and shocked once with 30J.  VT successfully terminated.  The device was reprogrammed in the VF zone to 24/32  NID and all shocks were changed to 35J.  DEVICE HISTORY:  May 2015 Medtronic CRT ICD 2013 generator change, new RV ICD lead 2004 ICD  Assessment and Plan:  1. Syncope with trauma (head laceration and C-spine fracture) VT in VF zone associated with the time of the patient's syncope. He has known ICM, and history of VT.  The device was reprogrammed as described uptitrate his BB to his BP tolerance No driving for 6 months, The patient states he has not driven in years, and no longer drives.   Note he carries DNR, we will defer device management/tachy therapies otherwise to Dr. [name] who he knows well, to f/u out patient for further discussion  2. PAFlutter d/w attending, given head trauma,  reasonable  to hold the xarelto, ?no longer taking, looks like this was stopped recently by the record. D/w attending, he will be f/iu with neuro surgery in 47mo, will defer  resuming anticoagulation to out patient f/u Recommend resuming when cleared to do so if no other contraindications chads2vasc score is at least 5.  Resume anticoagulation once cleared by neurosurgery (anticipate 2-4 weeks).  3. CAD No angina  4. ICM/MDT ICD optivol shows increased fluid, his exam is euvolemic and he is able to lie flat comfortably, he is at his baseline exertional status Will give lasix 40mg  IV x 1 then 40mg  PO daily x 3 days then 20mg  daily   Signed, Tommye Standard, PA-C 06/25/2015 12:54 PM   I have seen, examined the patient, and reviewed the above assessment and plan.  Device interrogation as above.  Successful 30J shock for unstable VT with syncope.   Changes to above are made where necessary.   Will titrate coreg.  If he has additional arrhythmias will likely add amiodarone at that time.  Given advanced age and DNR preference, would encourage Dr [name] to have a more in depth conversation with patient in the office regarding plans for ICD long term.  OK to discharge from EP standpoint. Electrophysiology team to see as needed while here. Please call with questions.   Co Sign: Thompson Grayer, MD 06/25/2015 4:10 PM

## 2015-06-26 DIAGNOSIS — S12000A Unspecified displaced fracture of first cervical vertebra, initial encounter for closed fracture: Secondary | ICD-10-CM

## 2015-06-26 DIAGNOSIS — I472 Ventricular tachycardia: Secondary | ICD-10-CM

## 2015-06-26 DIAGNOSIS — I4891 Unspecified atrial fibrillation: Secondary | ICD-10-CM

## 2015-06-26 DIAGNOSIS — I5022 Chronic systolic (congestive) heart failure: Secondary | ICD-10-CM | POA: Diagnosis not present

## 2015-06-26 LAB — HEMOGLOBIN A1C
Hgb A1c MFr Bld: 6 % — ABNORMAL HIGH (ref 4.8–5.6)
Mean Plasma Glucose: 126 mg/dL

## 2015-06-26 MED ORDER — ACETAMINOPHEN 500 MG PO TABS
1000.0000 mg | ORAL_TABLET | Freq: Three times a day (TID) | ORAL | Status: DC
  Administered 2015-06-26 – 2015-06-28 (×5): 1000 mg via ORAL
  Filled 2015-06-26 (×6): qty 2

## 2015-06-26 MED ORDER — DIVALPROEX SODIUM 125 MG PO CSDR
250.0000 mg | DELAYED_RELEASE_CAPSULE | Freq: Two times a day (BID) | ORAL | Status: DC
  Administered 2015-06-26 – 2015-06-27 (×2): 250 mg via ORAL
  Filled 2015-06-26 (×2): qty 2

## 2015-06-26 MED ORDER — OXYCODONE HCL 5 MG PO TABS
5.0000 mg | ORAL_TABLET | Freq: Four times a day (QID) | ORAL | Status: DC | PRN
  Administered 2015-06-28: 5 mg via ORAL
  Filled 2015-06-26: qty 1

## 2015-06-26 MED ORDER — LORAZEPAM 2 MG/ML IJ SOLN
1.0000 mg | INTRAMUSCULAR | Status: DC | PRN
  Administered 2015-06-26: 2 mg via INTRAVENOUS

## 2015-06-26 MED ORDER — LORAZEPAM 2 MG/ML IJ SOLN
INTRAMUSCULAR | Status: AC
  Filled 2015-06-26: qty 1

## 2015-06-26 MED ORDER — LORAZEPAM 1 MG PO TABS
1.0000 mg | ORAL_TABLET | Freq: Two times a day (BID) | ORAL | Status: DC
  Administered 2015-06-27: 1 mg via ORAL
  Filled 2015-06-26 (×2): qty 1

## 2015-06-26 MED ORDER — LORAZEPAM 2 MG/ML IJ SOLN
1.0000 mg | Freq: Once | INTRAMUSCULAR | Status: AC
  Administered 2015-06-26: 1 mg via INTRAVENOUS
  Filled 2015-06-26: qty 1

## 2015-06-26 NOTE — Progress Notes (Signed)
Patient is now awake and alert to self, able to take medication at this time along with dinner. Patient placed in room near nursing station for frequent monitoring/hourly rounding. He denied pain. Patient then noted falling back to sleep after dinner. Will continue to monitor.   Ave Filter, RN

## 2015-06-26 NOTE — Clinical Social Work Placement (Signed)
   CLINICAL SOCIAL WORK PLACEMENT  NOTE  Date:  06/26/2015  Patient Details  Name: SHERRICK ARAKI MRN: [id number] Date of Birth: Sep 14, 1926  Clinical Social Work is seeking post-discharge placement for this patient at the [place] level of care (*CSW will initial, date and re-position this form in  chart as items are completed):  Yes   Patient/family provided with [facility] Work Department's list of facilities offering this level of care within the geographic area requested by the patient (or if unable, by the patient's family).  Yes   Patient/family informed of their freedom to choose among providers that offer the needed level of care, that participate in Medicare, Medicaid or managed care program needed by the patient, have an available bed and are willing to accept the patient.  Yes   Patient/family informed of [facility]'s ownership interest in [facility] and [facility], as well as of the fact that they are under no obligation to receive care at these facilities.  PASRR submitted to EDS on       PASRR number received on       Existing PASRR number confirmed on 06/26/15     FL2 transmitted to all facilities in geographic area requested by pt/family on 06/26/15     FL2 transmitted to all facilities within larger geographic area on       Patient informed that his/her managed care company has contracts with or will negotiate with certain facilities, including the following:            Patient/family informed of bed offers received.  Patient chooses bed at       Physician recommends and patient chooses bed at      Patient to be transferred to   on  .  Patient to be transferred to facility by       Patient family notified on   of transfer.  Name of family member notified:        PHYSICIAN       Additional Comment:    _______________________________________________ Liz Beach MSW, [place], [place], [phone]

## 2015-06-26 NOTE — Progress Notes (Signed)
PATIENT HAS BECOME INCREASINGLY AGITATED AND NONCOMPLIANT, NONVIOLENT. PATIENT KEPT AT STATION FOR MONITORING. ORDER OBTAINED FOR SEDATION, ATIVAN IV GIVEN WITH GOOD EFFECT.

## 2015-06-26 NOTE — Progress Notes (Addendum)
[facility] PROGRESS NOTE  GLOYD HAPP ZRA:[id number] DOB: 04-Apr-1927 DOA: 06/24/2015 PCP: [name], MD  Assessment/Plan: Suspected Syncope: Secondary to ventricular tachycardia-found on AICD interrogation-patient was defibrillated. Patient is a poor historian-and has no recollection of any shocks that were delivered. EPS consulted-recommendations are to increase beta blocker-defer starting antiarrhythmics to cardiology. Per EPS-patient will follow-up with his primary cardiologist-Dr. [name] for further discussion regarding hospice/turning off the AICD.  Active Problems: Delirium: Suspect dementia at baseline. Confused at times but easily redirectable. Will start Depakote 250 mg twice a day, and increase lorazepam to 1 mg twice a day. Have consulted psychiatry for assistance in management of delirium.   C1 cervical fracture: Secondary to fall due to above. Seen by neurosurgery, recommendations are to continue cervical collar and have outpatient neurosurgical follow-up. Continue supportive care with scheduled Tylenol, will try and attempt to minimize narcotics as much as possible. .  Orthostatic hypotension: Difficult situation-cardiology contemplating increasing beta blocker-however orthostatic. We will place TED hose and follow.  Laceration of forehead and left ring finger: Sutured in the emergency room, given tdap-will need follow-up with PCP in one week to remove sutures.  Chronic systolic heart failure: Clinically compensated-continue Coreg and Aldactone. Suspect given orthostatic hypotension/soft BP-no room to add ACE inhibitor or more Diuretics.  AICD in place: See above  History of atrial flutter: Previously on anticoagulation with Xarelto-apparently discontinued prior to this admission. Clearly not a candidate for anticoagulation given fall risk/C1 fracture. Continue Coreg.  Anemia: Appears chronic-but hemoglobin seems to be slightly usual baseline evidence of blood  loss stable for outpatient workup.  Thrombocytopenia/leukopenia: Appears chronic-stable for outpatient workup  History of CAD: Status post CABG and PCI-continue aspirin, statin, Coreg  GERD: PPI  DNR/Home Hospice  Disposition: Remain inpatientIf an SNF on 10/22.  Antimicrobial agents  See below  Anti-infectives    None      DVT Prophylaxis: Prophylactic Lovenox   Code Status: DNR  Family Communication None at bedside  Procedures: None  CONSULTS: cardiology, PT/OT      HPI/Subjective: Neck pain, mildly confused.  Objective: Filed Vitals:   06/26/15 0521  BP: 130/67  Pulse: 62  Temp: 98.1 F (36.7 C)  Resp: 18    Intake/Output Summary (Last 24 hours) at 06/26/15 1157 Last data filed at 06/26/15 0513  Gross per 24 hour  Intake      0 ml  Output    250 ml  Net   -250 ml   Filed Weights   06/24/15 1150 06/24/15 1930  Weight: 71.668 kg (158 lb) 69.6 kg (153 lb 7 oz)    Exam: Gen Exam: Awake and alert with clear speech. Neck:  Cervical Collar in place Chest: B/L Clear.  CVS: S1 S2 Regular Abdomen: soft, BS +, non tender, non distended.  Extremities: no edema, lower extremities warm to touch. Neurologic: Non Focal.  Skin: No Rash. Psychiatric: mildly confused Wounds: N/A.   Data Reviewed: Basic Metabolic Panel:  Recent Labs Lab 06/24/15 1500  NA 141  K 4.0  CL 109  CO2 23  GLUCOSE 130*  BUN 16  CREATININE 1.24  CALCIUM 8.9   Liver Function Tests: No results for input(s): AST, ALT, ALKPHOS, BILITOT, PROT, ALBUMIN in the last 168 hours. No results for input(s): LIPASE, AMYLASE in the last 168 hours. No results for input(s): AMMONIA in the last 168 hours. CBC:  Recent Labs Lab 06/24/15 1500 06/25/15 0407  WBC 3.7* 3.1*  NEUTROABS 2.8  --   HGB 8.9* 8.7*  HCT 26.4* 26.5*  MCV 93.6 94.6  PLT 126* 137*   Cardiac Enzymes: No results for input(s): CKTOTAL, CKMB, CKMBINDEX, TROPONINI in the last 168 hours. BNP  (last 3 results) No results for input(s): BNP in the last 8760 hours.  ProBNP (last 3 results)  Recent Labs  07/26/14 1608  PROBNP 2011.0*    CBG:  Recent Labs Lab 06/24/15 2238  GLUCAP 219*    No results found for this or any previous visit (from the past 240 hour(s)).   Studies: Dg Lumbar Spine 2-3 Views  06/24/2015  CLINICAL DATA:  Fall today. Known cervical spine fracture. Initial encounter. EXAM: LUMBAR SPINE - 2-3 VIEW COMPARISON:  CT abdomen and pelvis 10/31/2014 FINDINGS: There are 5 non rib-bearing lumbar type vertebral bodies. Grade 1 anterolisthesis is again seen of L4 on L5 and measures approximately 8 mm, unchanged and facet mediated. Severe L4-5 disc space narrowing and L5 superior endplate Schmorl's node deformity are similar to the prior CT. There is no evidence of acute fracture. Diffuse aortoiliac atherosclerotic calcification is noted. Phleboliths are present in the pelvis. IMPRESSION: 1. No acute osseous abnormality identified in the lumbar spine. 2. Unchanged anterolisthesis of L4 on L5 and advanced L4-5 disc degeneration. Electronically Signed   By: Logan Bores M.D.   On: 06/24/2015 16:20   Ct Head Wo Contrast  06/24/2015  CLINICAL DATA:  Fall from standing while putting on a shirt. Lost balance and hit the floor, C1 fracture. Initial encounter. EXAM: CT HEAD WITHOUT CONTRAST TECHNIQUE: Contiguous axial images were obtained from the base of the skull through the vertex without intravenous contrast. COMPARISON:  CT cervical spine 06/24/2015 and CT head 01/16/2013. FINDINGS: No evidence of an acute infarct, acute hemorrhage, mass lesion, mass effect or hydrocephalus. Atrophy. Encephalomalacia in the left parietal lobe. Confluent low-attenuation in the periventricular deep white matter. Small remote infarcts in the cerebellar hemispheres. C1 fracture is partially imaged. No additional evidence of an acute fracture. Mandibular condyles are located. Scattered mucosal  thickening in the paranasal sinuses. IMPRESSION: 1. No acute intracranial abnormality. 2. C1 fracture partially imaged. Please see dedicated CT cervical spine dictated the same day for further details. 3. Atrophy, chronic microvascular white matter ischemic changes and remote infarcts. Electronically Signed   By: Lorin Picket M.D.   On: 06/24/2015 15:56   Ct Cervical Spine Wo Contrast  06/24/2015  CLINICAL DATA:  Imbalance with fall. Neck pain radiating to the head. EXAM: CT CERVICAL SPINE WITHOUT CONTRAST TECHNIQUE: Multidetector CT imaging of the cervical spine was performed without intravenous contrast. Multiplanar CT image reconstructions were also generated. COMPARISON:  None. FINDINGS: There is a comminuted fracture of the mid anterior C1 arch. There is a comminuted fracture of the mid posterior C1 arch. There is 4 mm lateral subluxation of the right lateral C1 mass relative to the C2 lateral mass. There is 2 mm lateral subluxation of the left C1 mass relative to the left C2 articular mass. There is associated prevertebral soft tissue swelling. There is associated edema and hemorrhage in the transverse ligament causing at least mild anterior canal stenosis at the C1 level. No additional cervical spine fracture. There is straightening of the cervical spine, usually due to positioning and/or muscle spasm. There is severe degenerative disc disease throughout the cervical spine, most prominent at C3-4, C5-6 and C6-7 with ankylosis at C6-7. There is minimal 2 mm anterolisthesis at C2-3, minimal 2 mm retrolisthesis at C4-5 and mild 3 mm anterolisthesis at C7-T1, likely degenerative. There  is moderate foraminal stenosis on the right at C2-3. There is mild foraminal stenosis on the left at C3-4 and on the left at C5-6. Visualized mastoid air cells appear clear. No evidence of intra-axial hemorrhage in the visualized brain. Visualized lung apices are clear. No appreciable cervical adenopathy. There is  atherosclerotic calcification in the bilateral extracranial carotid arteries. Scout topogram demonstrates partially visualized multi lead left subclavian ICD and intact median sternotomy wires. IMPRESSION: 1. Comminuted mid anterior and posterior C1 arch fractures, which appear acute, with mild lateral subluxation of the lateral C1 masses relative to the lateral C2 masses bilaterally. 2. Prevertebral and transverse ligament edema/hemorrhage causing at least mild anterior canal stenosis at the C1 level. 3. Severe degenerative disc disease and minimal multilevel degenerative spondylolisthesis throughout the cervical spine. These results were called by telephone at the time of interpretation on 06/24/2015 at 2:24 pm to Dr. Joseph Berkshire , who verbally acknowledged these results. Electronically Signed   By: Ilona Sorrel M.D.   On: 06/24/2015 14:27   Dg Finger Ring Left  06/24/2015  CLINICAL DATA:  Left fourth finger laceration after fall. EXAM: LEFT RING FINGER 2+V COMPARISON:  None. FINDINGS: No fracture, dislocation or suspicious focal osseous lesion. Joint spaces appear normal. There are mild curvilinear soft tissue calcifications along the radial aspect of the fourth finger. IMPRESSION: 1. No fracture or malalignment in the left fourth finger. 2. Mild curvilinear soft tissue calcifications along the radial aspect of the left fourth finger, nonspecific, possibly due to gout or an inflammatory condition such as polymyositis/dermatomyositis. Electronically Signed   By: Ilona Sorrel M.D.   On: 06/24/2015 16:17    Scheduled Meds: . acetaminophen  1,000 mg Oral TID  . allopurinol  300 mg Oral Daily  . aspirin EC  81 mg Oral Daily  . atorvastatin  20 mg Oral Daily  . carvedilol  6.25 mg Oral BID WC  . enoxaparin (LOVENOX) injection  40 mg Subcutaneous Q24H  . escitalopram  10 mg Oral Daily  . famotidine  20 mg Oral Daily  . LORazepam  0.5 mg Oral BID  . olopatadine  1 drop Both Eyes BID  .  pantoprazole  40 mg Oral Daily  . spironolactone  12.5 mg Oral Daily  . sucralfate  1 g Oral BID   Continuous Infusions:   Principal Problem:   C1 cervical fracture (HCC) Active Problems:   Coronary atherosclerosis   Chronic systolic heart failure (HCC)   Diabetes mellitus (HCC)   Anemia   Laceration   Intractable pain   A-fib (HCC)   VT (ventricular tachycardia) ([place])  Time spent: 30 minutes  Lawernce Keas  PA-S [facility]  If 7PM-7AM, please contact night-coverage at [email or web address], password [facility] 06/26/2015, 11:57 AM     Attending MD note  Patient was seen, examined,treatment plan was discussed with the PA-S.  I have personally reviewed the clinical findings, lab, imaging studies and management of this patient in detail. I agree with the documentation, as recorded by the PA-S.   Pleasantly confused-suspected dementia at baseline. Minimize narcotics as much as possible-start scheduled Tylenol. For delirium increase lorazepam to 1 mg twice a day and add Depakote sprinkles 250 mg twice a day. Have consulted psychiatry. Reevaluated by physical therapy today-recommendations are for SNF-spoke with social worker-plans are for SNF on 10/22 if continues to be stable   [facility]

## 2015-06-26 NOTE — Clinical Social Work Note (Signed)
Clinical Social Work Assessment  Patient Details  Name: [name] MRN: [id number] Date of Birth: 25-Oct-1926  Date of referral:  06/26/15               Reason for consult:  Discharge Planning, Facility Placement                Permission sought to share information with:  Family Supports, Customer service manager Permission granted to share information::  No (Patient very confused. Spoke with HPOA [name])  Name::     [name]  Agency::  SNFs  Relationship::     Contact Information:     Housing/Transportation Living arrangements for the past 2 months:  Apartment Source of Information:  Other (Comment Required) (friend/HPOA) Patient Interpreter Needed:  None Criminal Activity/Legal Involvement Pertinent to Current Situation/Hospitalization:  No - Comment as needed Significant Relationships:  Friend Lives with:  Self Do you feel safe going back to the place where you live?  No Need for family participation in patient care:  Yes (Comment)  Care giving concerns:  Patient's friend [name] reports that the patient cannot return to his home alone at discharge.   Social Worker assessment / plan:  CSW spoke with patient's friend/HPOA [name] to complete assessment. [name] states that the patient will need to be discharged to a SNF as it is unsafe for him to return home at this time. The patient was receiving hospice services at home. In order to utilize Medicare for SNF placement the hospice would have to be revoked by [facility]. She is agreeable to this, but unfortunately the patient is observation and will not be able to utilize SNF benefits with Medicare due to this. CSW spoke with [name] of [facility] [phone]. She states she will assist with having the hospice benefit revoked if necessary (CSW can contact her at this number is this is needed). CSW discussed situation with [name]. She states that the patient is unable to pay privately for SNF. CSW was able to  get the patient a 30 day letter of guarantee to cover the patient's SNF stay until he is approved for Medicaid. [name] States she will start the Medicaid application on Monday. At this time we are searching for a letter of guarantee bed for the patient that will take him pending Medicaid approval. CSW explained to [name] that options will be VERY limited and that the patient will have to DC to the first available bed. She verbalizes understanding. Report will be left for weekend CSW.  Employment status:  Disabled (Comment on whether or not currently receiving Disability), Retired Forensic scientist:  Self Pay (Medicaid Pending), Other (Comment Required) (If patient revokes hospice he would have Medicare, but the patient is currently observation and would not meet the necessary three night inpatient stay for SNF benefits. Patient is essentially a self pay/letter of guarantee placement) PT Recommendations:  [place] / Referral to community resources:  [place]  Patient/Family's Response to care:  Patient's friend [name] appears to be happy with the care the patient has received.   Patient/Family's Understanding of and Emotional Response to Diagnosis, Current Treatment, and Prognosis:  Patient's friend [name] understands the reason for the patient's admission, diagnosis, and post DC needs.  Emotional Assessment Appearance:  Appears stated age Attitude/Demeanor/Rapport:  Unable to Assess Affect (typically observed):  Unable to Assess Orientation:  Oriented to Self Alcohol / Substance use:  Alcohol Use, Tobacco Use Psych involvement (Current and /or in  the community):  No (Comment)  Discharge Needs  Concerns to be addressed:  Discharge Planning Concerns Readmission within the last 30 days:  No Current discharge risk:  Chronically ill, Physical Impairment, Cognitively Impaired Barriers to Discharge:  Continued Medical Work up   [employer] MSW,  [place], [place], [phone]

## 2015-06-26 NOTE — Progress Notes (Signed)
Pt is highly agitated and confused. Has been jumping of bed all night. Charge RN had to pull NT off the floor to sit with pt. Now pt is trying to take off aspen collar, against RN advice. Pt has been educated about potential for further injury without collar on. On call K. Schorr text paged and made aware. Will continue to monitor. Fortino Sic, RN, BSN 06/26/2015 3:46 AM

## 2015-06-26 NOTE — Clinical Social Work Note (Deleted)
CSW notified by unit that the patient is for discharge today. Plan is for SNF placement. CSW met with patient's daughters and patient at bedside. Full assessment not completed with patient and family. SNF process explained thoroughly to patient and daughters. Patient has been to [facility] in the past and requests this facility. Patient and family understand that this facility cannot be guaranteed. CSW will followup with offers and DC patient today.   Liz Beach MSW, [place], [place], [phone]

## 2015-06-26 NOTE — Progress Notes (Signed)
Occupational Therapy Treatment Patient Details Name: [name] MRN: [id number] DOB: 10/20/1926 Today's Date: 06/26/2015    History of present illness Pt is an 81 y/o male with a PMH that includes HTN, DM, ischemic cardiomyopathy s/p ICD (EF 25%). He presents s/p mechanical fall and C1 fracture.   OT comments  Pt made no progress today.  Pt with significant cognitive decline from yesterday's evaluation.  Not sure if medicine related.  Spoke to nursing about state of pt.  Pt did fall when he broke his C1 vertebrae.  CT scan at time was negative.  Will continue to monitor. D/C plan updated based on pt's status today.  Follow Up Recommendations  SNF;Supervision/Assistance - 24 hour    Equipment Recommendations  None recommended by OT    Recommendations for Other Services      Precautions / Restrictions Precautions Precautions: Fall;Cervical Required Braces or Orthoses: Cervical Brace Cervical Brace: Hard collar;At all times Restrictions Weight Bearing Restrictions: No       Mobility Bed Mobility               General bed mobility comments: Pt in chair on arrival.  Transfers Overall transfer level: Needs assistance Equipment used: Rolling walker (2 wheeled) Transfers: Sit to/from [facility] Sit to Stand: Min assist Stand pivot transfers: Mod assist       General transfer comment: Pt very impulsive and unsafe today. Not in mental state to apply cues.    Balance Overall balance assessment: Needs assistance Sitting-balance support: Feet supported Sitting balance-Leahy Scale: Poor     Standing balance support: Bilateral upper extremity supported;During functional activity Standing balance-Leahy Scale: Zero Standing balance comment: Pt would have fallen w/o max outside support and refused to use the walker.                   ADL Overall ADL's : Needs assistance/impaired     Grooming: Wash/dry hands;Moderate assistance Grooming  Details (indicate cue type and reason): mod assist to remain standing.                 Toilet Transfer: Moderate assistance;Ambulation;RW Toilet Transfer Details (indicate cue type and reason): Pt kept pushing walker out of way refusing to use it.  Finally walked pt to bathroom with moderate assist. Pt would have fallen if therapist not there.  Pt urinated on floor despite cues to step toward the toilet.   Toileting- Clothing Manipulation and Hygiene: Total assistance;Sit to/from stand Toileting - Clothing Manipulation Details (indicate cue type and reason): Pt unable to remain standing and clean self.       Functional mobility during ADLs: Moderate assistance;Rolling walker General ADL Comments: Pt's cognitive status much changed today requiring almost max to total assist for some adls. Pt unable to be educated in his state today. Pt thought he was at the store.      Vision                     Perception     Praxis      Cognition   Behavior During Therapy: Agitated;Impulsive Overall Cognitive Status: Impaired/Different from baseline Area of Impairment: Orientation;Attention;Memory;Following commands;Safety/judgement;Awareness;Problem solving Orientation Level: Place;Time;Situation Current Attention Level: Focused Memory: Decreased recall of precautions;Decreased short-term memory  Following Commands: Follows one step commands inconsistently Safety/Judgement: Decreased awareness of safety;Decreased awareness of deficits Awareness: Intellectual Problem Solving: Slow processing;Difficulty sequencing;Requires verbal cues;Requires tactile cues General Comments: Pt very confused today to point where theapy was unproductive.  Pt attempting to get up and walk on his own, went to bathroom and unable to urinate in toilet and went on the floor, would have fallen several times if not for therapist being present but not responding to cues to correct.    Extremity/Trunk  Assessment               Exercises     Shoulder Instructions       General Comments      Pertinent Vitals/ Pain       Pain Assessment: Faces Faces Pain Scale: Hurts a little bit Pain Location: neck Pain Descriptors / Indicators: Grimacing Pain Intervention(s): Limited activity within patient's tolerance;Repositioned  Home Living                                          Prior Functioning/Environment              Frequency Min 2X/week     Progress Toward Goals  OT Goals(current goals can now be found in the care plan section)  Progress towards OT goals: Not progressing toward goals - comment (pt with decreased cognition today)  Acute Rehab OT Goals Patient Stated Goal: unable to state OT Goal Formulation: Patient unable to participate in goal setting Time For Goal Achievement: 07/02/15 Potential to Achieve Goals: Good ADL Goals Pt Will Perform Grooming: with modified independence;standing Pt Will Perform Upper Body Bathing: sitting;with min assist Pt Will Perform Lower Body Bathing: sit to/from stand;with supervision Pt Will Perform Upper Body Dressing: with modified independence;sitting Pt Will Perform Lower Body Dressing: with modified independence;sit to/from stand Pt Will Transfer to Toilet: with modified independence;ambulating Pt Will Perform Toileting - Clothing Manipulation and hygiene: with modified independence;sitting/lateral leans;sit to/from stand Additional ADL Goal #1: Pt will complete bed mobility at mod I level to prepare for OOB ADLs.   Plan Discharge plan needs to be updated    Co-evaluation                 End of Session Equipment Utilized During Treatment: Gait belt;Rolling walker;Cervical collar   Activity Tolerance Treatment limited secondary to agitation   Patient Left in chair;with call bell/phone within reach;with nursing/sitter in room;Other (comment) (pt taken to nursing station)   Nurse Communication  Other (comment) (cognition.  pt very different presentation from yesterday)    Functional Assessment Tool Used: clinical judgement Functional Limitation: Self care Self Care Current Status [phone]): At least 60 percent but less than 80 percent impaired, limited or restricted Self Care Goal Status (V4008): At least 1 percent but less than 20 percent impaired, limited or restricted   Time: 1105-1125 OT Time Calculation (min): 20 min  Charges: OT G-codes **NOT FOR INPATIENT CLASS** Functional Assessment Tool Used: clinical judgement Functional Limitation: Self care Self Care Current Status (Q7619): At least 60 percent but less than 80 percent impaired, limited or restricted Self Care Goal Status (J0932): At least 1 percent but less than 20 percent impaired, limited or restricted OT General Charges $OT Visit: 1 Procedure OT Treatments $Self Care/Home Management : 8-22 mins  Glenford Peers 06/26/2015, 11:43 AM  [phone]

## 2015-06-26 NOTE — Progress Notes (Signed)
Physical Therapy Treatment Patient Details Name: [name] MRN: [id number] DOB: [birth year] Today's Date: 06/26/2015    History of Present Illness Pt is an 81 y/o male with a PMH that includes HTN, DM, ischemic cardiomyopathy s/p ICD (EF 25%). He presents s/p mechanical fall and C1 fracture.    PT Comments    Pt with limited progress towards physical therapy goals. Significant decline in function and cognition noted from initial evaluation. Do not feel this patient is safe to d/c home, especially alone, and d/c disposition has been updated to reflect this. Will continue to follow and progress as able per POC.   Follow Up Recommendations  SNF;Supervision/Assistance - 24 hour     Equipment Recommendations  None recommended by PT    Recommendations for Other Services OT consult     Precautions / Restrictions Precautions Precautions: Fall;Cervical Required Braces or Orthoses: Cervical Brace Cervical Brace: Hard collar;At all times Restrictions Weight Bearing Restrictions: No    Mobility  Bed Mobility               General bed mobility comments: Pt received in hall in recliner with nursing staff  Transfers Overall transfer level: Needs assistance Equipment used: Rolling walker (2 wheeled);1 person hand held assist Transfers: Sit to/from Stand Sit to Stand: Max assist;Min assist Stand pivot transfers: Mod assist       General transfer comment: Impulsive and unsafe with transfers. Was not listening to therapist and initially refused gait belt. Pt stood after cued not to, and required max assist to prevent a fall. This happened x3 before a walker could be placed in front of him. With walker, pt continued to require min assist for balance and safety.   Ambulation/Gait Ambulation/Gait assistance: Mod assist Ambulation Distance (Feet): 150 Feet Assistive device: Rolling walker (2 wheeled) Gait Pattern/deviations: Step-through pattern;Decreased stride  length;Staggering left;Staggering right Gait velocity: Decreased Gait velocity interpretation: Below normal speed for age/gender General Gait Details: Mod assist for balance and walker management. Pt not able to focus on task and had multiple near falls due to LOB.    Stairs            Wheelchair Mobility    Modified Rankin (Stroke Patients Only)       Balance Overall balance assessment: Needs assistance;History of Falls Sitting-balance support: Feet supported;No upper extremity supported Sitting balance-Leahy Scale: Poor     Standing balance support: Bilateral upper extremity supported;During functional activity Standing balance-Leahy Scale: Zero Standing balance comment: Pt would have fallen w/o max outside support and refused to use the walker.                    Cognition Arousal/Alertness: Awake/alert Behavior During Therapy: Agitated;Impulsive Overall Cognitive Status: Impaired/Different from baseline Area of Impairment: Orientation;Attention;Memory;Following commands;Safety/judgement;Awareness;Problem solving Orientation Level: Disoriented to;Place;Time;Situation Current Attention Level: Focused Memory: Decreased recall of precautions;Decreased short-term memory Following Commands: Follows one step commands inconsistently Safety/Judgement: Decreased awareness of safety;Decreased awareness of deficits Awareness: Intellectual Problem Solving: Slow processing;Difficulty sequencing;Requires verbal cues;Requires tactile cues General Comments: Pt oriented only to self, with significant impulsivity with little awareness of how unbalanced he was. Believed he was in a Art therapist and that he recognized therapist as a Educational psychologist in [place].     Exercises      General Comments        Pertinent Vitals/Pain Pain Assessment: Faces Faces Pain Scale: Hurts a little bit Pain Location: neck Pain Descriptors / Indicators: Grimacing;Discomfort Pain Intervention(s):  Limited activity within patient's tolerance;Monitored  during session;Repositioned    Home Living                      Prior Function            PT Goals (current goals can now be found in the care plan section) Acute Rehab PT Goals Patient Stated Goal: unable to state PT Goal Formulation: With patient Time For Goal Achievement: 07/02/15 Potential to Achieve Goals: Good Progress towards PT goals: Progressing toward goals    Frequency  Min 3X/week    PT Plan Discharge plan needs to be updated    Co-evaluation             End of Session Equipment Utilized During Treatment: Gait belt;Cervical collar Activity Tolerance: Other (comment) (Limited by behavior) Patient left: in chair;with call bell/phone within reach;with chair alarm set     Time: 9774-1423 PT Time Calculation (min) (ACUTE ONLY): 12 min  Charges:  $Gait Training: 8-22 mins                    G Codes:      Rolinda Roan 07-13-2015, 12:50 PM   Rolinda Roan, PT, DPT Acute Rehabilitation Services Pager: [phone]

## 2015-06-27 DIAGNOSIS — I4891 Unspecified atrial fibrillation: Secondary | ICD-10-CM | POA: Diagnosis not present

## 2015-06-27 DIAGNOSIS — I5022 Chronic systolic (congestive) heart failure: Secondary | ICD-10-CM | POA: Diagnosis not present

## 2015-06-27 DIAGNOSIS — S12000A Unspecified displaced fracture of first cervical vertebra, initial encounter for closed fracture: Secondary | ICD-10-CM | POA: Diagnosis not present

## 2015-06-27 DIAGNOSIS — I472 Ventricular tachycardia: Secondary | ICD-10-CM | POA: Diagnosis not present

## 2015-06-27 MED ORDER — LORAZEPAM 2 MG/ML IJ SOLN: 1.0000 mg | Freq: Four times a day (QID) | INTRAMUSCULAR | Status: DC | PRN

## 2015-06-27 MED ORDER — DIVALPROEX SODIUM 125 MG PO CSDR: 250.0000 mg | DELAYED_RELEASE_CAPSULE | Freq: Every day | ORAL | Status: DC

## 2015-06-27 MED ORDER — LORAZEPAM 0.5 MG PO TABS
0.5000 mg | ORAL_TABLET | Freq: Two times a day (BID) | ORAL | Status: DC
  Administered 2015-06-27 – 2015-06-28 (×2): 0.5 mg via ORAL
  Filled 2015-06-27 (×2): qty 1

## 2015-06-27 NOTE — Progress Notes (Signed)
PATIENT DETAILS Name: [name] Age: 81 y.o. Sex: male Date of Birth: [birth year] Admit Date: 06/24/2015 Admitting Physician Debbe Odea, MD PQD:[id number], Nicki Reaper, MD  Subjective: Sleeping this am-arouses easily-moves all 4 ext  Assessment/Plan: Suspected Syncope: Secondary to ventricular tachycardia-found on AICD interrogation-patient was defibrillated. Patient is a poor historian-and has no recollection of any shocks that were delivered. EPS consulted-recommendations are to increase beta blocker-defer starting antiarrhythmics to cardiology. Per EPS-patient will follow-up with his primary cardiologist-Dr. [name] for further discussion regarding hospice/turning off the AICD.  Active Problems: Delirium: Suspect dementia at baseline. Confused at times but easily redirectable. Continue Depakote 250 mg twice a day, and  lorazepam to 1 mg twice a day. Have consulted psychiatry for assistance in management of delirium.   C1 cervical fracture: Secondary to fall due to above. Seen by neurosurgery, recommendations are to continue cervical collar and have outpatient neurosurgical follow-up. Continue supportive care with scheduled Tylenol, will try and attempt to minimize narcotics as much as possible. .  Orthostatic hypotension: Difficult situation-cardiology contemplating increasing beta blocker-however orthostatic. We will place TED hose and follow.  Laceration of forehead and left ring finger: Sutured in the emergency room, given tdap-will need follow-up with PCP in one week to remove sutures.  Chronic systolic heart failure: Clinically compensated-continue Coreg and Aldactone. Suspect given orthostatic hypotension/soft BP-no room to add ACE inhibitor or more Diuretics.  AICD in place: See above  History of atrial flutter: Previously on anticoagulation with Xarelto-apparently discontinued prior to this admission. Clearly not a candidate for anticoagulation given fall  risk/C1 fracture. Continue Coreg.  Anemia: Appears chronic-but hemoglobin seems to be slightly usual baseline evidence of blood loss stable for outpatient workup.  Thrombocytopenia/leukopenia: Appears chronic-stable for outpatient workup  History of CAD: Status post CABG and PCI-continue aspirin, statin, Coreg  GERD: PPI  DNR/Home Hospice         Disposition: Remain inpatient  Antimicrobial agents  See below  Anti-infectives    None      DVT Prophylaxis: Prophylactic Lovenox   Code Status:  DNR  Family Communication None at bedside  Procedures: None  CONSULTS:  cardiology  Time spent 30 minutes-Greater than 50% of this time was spent in counseling, explanation of diagnosis, planning of further management, and coordination of care.  MEDICATIONS: Scheduled Meds: . acetaminophen  1,000 mg Oral TID  . allopurinol  300 mg Oral Daily  . aspirin EC  81 mg Oral Daily  . atorvastatin  20 mg Oral Daily  . carvedilol  6.25 mg Oral BID WC  . divalproex  250 mg Oral Q12H  . enoxaparin (LOVENOX) injection  40 mg Subcutaneous Q24H  . escitalopram  10 mg Oral Daily  . famotidine  20 mg Oral Daily  . LORazepam  1 mg Oral BID  . olopatadine  1 drop Both Eyes BID  . pantoprazole  40 mg Oral Daily  . spironolactone  12.5 mg Oral Daily  . sucralfate  1 g Oral BID   Continuous Infusions:  PRN Meds:.alum & mag hydroxide-simeth, diphenhydrAMINE, LORazepam, magnesium citrate, ondansetron **OR** ondansetron (ZOFRAN) IV, oxyCODONE, tiZANidine    PHYSICAL EXAM: Vital signs in last 24 hours: Filed Vitals:   06/26/15 2115 06/27/15 0203 06/27/15 0601 06/27/15 0940  BP: 121/75 134/56 147/82 133/57  Pulse: 69 60 66 75  Temp: 97.8 F (36.6 C) 97.9 F (36.6 C) 98 F (36.7 C) 98.8 F (37.1 C)  TempSrc: Oral Oral Oral Oral  Resp: 18 18 18 18   Height:      Weight:      SpO2: 100% 100%  96%    Weight change:  Filed Weights   06/24/15 1150 06/24/15 1930  Weight: 71.668  kg (158 lb) 69.6 kg (153 lb 7 oz)   Body mass index is 26.32 kg/(m^2).   Gen Exam: Awake and alert with clear speech- at the nurses station Neck:  Cervical collar in place Chest: B/L Clear.   CVS: S1 S2 Regular, no murmurs.  Abdomen: soft, BS +, non tender, non distended.  Extremities: no edema, lower extremities warm to touch Neurologic: Non Focal.   Skin: No Rash.   Wounds: N/A.    Intake/Output from previous day:  Intake/Output Summary (Last 24 hours) at 06/27/15 1141 Last data filed at 06/27/15 0730  Gross per 24 hour  Intake    240 ml  Output    775 ml  Net   -535 ml     LAB RESULTS: CBC  Recent Labs Lab 06/24/15 1500 06/25/15 0407  WBC 3.7* 3.1*  HGB 8.9* 8.7*  HCT 26.4* 26.5*  PLT 126* 137*  MCV 93.6 94.6  MCH 31.6 31.1  MCHC 33.7 32.8  RDW 14.5 14.5  LYMPHSABS 0.5*  --   MONOABS 0.3  --   EOSABS 0.1  --   BASOSABS 0.0  --     Chemistries   Recent Labs Lab 06/24/15 1500  NA 141  K 4.0  CL 109  CO2 23  GLUCOSE 130*  BUN 16  CREATININE 1.24  CALCIUM 8.9    CBG:  Recent Labs Lab 06/24/15 2238  GLUCAP 219*    GFR Estimated Creatinine Clearance: 35.8 mL/min (by C-G formula based on Cr of 1.24).  Coagulation profile No results for input(s): INR, PROTIME in the last 168 hours.  Cardiac Enzymes No results for input(s): CKMB, TROPONINI, MYOGLOBIN in the last 168 hours.  Invalid input(s): CK  Invalid input(s): POCBNP No results for input(s): DDIMER in the last 72 hours.  Recent Labs  06/24/15 2158  HGBA1C 6.0*   No results for input(s): CHOL, HDL, LDLCALC, TRIG, CHOLHDL, LDLDIRECT in the last 72 hours. No results for input(s): TSH, T4TOTAL, T3FREE, THYROIDAB in the last 72 hours.  Invalid input(s): FREET3  Recent Labs  06/24/15 2158  VITAMINB12 703  FOLATE 48.0  FERRITIN 165  TIBC 239*  IRON 54  RETICCTPCT 1.7   No results for input(s): LIPASE, AMYLASE in the last 72 hours.  Urine Studies No results for input(s):  UHGB, CRYS in the last 72 hours.  Invalid input(s): UACOL, UAPR, USPG, UPH, UTP, UGL, UKET, UBIL, UNIT, UROB, ULEU, UEPI, UWBC, URBC, UBAC, CAST, UCOM, BILUA  MICROBIOLOGY: No results found for this or any previous visit (from the past 240 hour(s)).  RADIOLOGY STUDIES/RESULTS: Dg Chest 2 View  06/18/2015  CLINICAL DATA:  Right shoulder and neck pain EXAM: CHEST  2 VIEW COMPARISON:  10/31/2014 FINDINGS: Chronic cardiopericardial enlargement. ICD/pacer leads from the left, with 2 right ventricular leads and a direct epicardial lead on the left in stable position. Unchanged aortic and hilar contours. Obscured right base, likely atelectasis given bandlike opacity below the right minor fissure. Small pleural effusions. No edema or pneumothorax. Severe right glenohumeral osteoarthritis with bilateral high humeral head, consistent with chronic rotator cuff tear. IMPRESSION: Trace effusions with mild right basilar atelectasis. Electronically Signed   By: Monte Fantasia M.D.   On: 06/18/2015 12:09  Dg Lumbar Spine 2-3 Views  06/24/2015  CLINICAL DATA:  Fall today. Known cervical spine fracture. Initial encounter. EXAM: LUMBAR SPINE - 2-3 VIEW COMPARISON:  CT abdomen and pelvis 10/31/2014 FINDINGS: There are 5 non rib-bearing lumbar type vertebral bodies. Grade 1 anterolisthesis is again seen of L4 on L5 and measures approximately 8 mm, unchanged and facet mediated. Severe L4-5 disc space narrowing and L5 superior endplate Schmorl's node deformity are similar to the prior CT. There is no evidence of acute fracture. Diffuse aortoiliac atherosclerotic calcification is noted. Phleboliths are present in the pelvis. IMPRESSION: 1. No acute osseous abnormality identified in the lumbar spine. 2. Unchanged anterolisthesis of L4 on L5 and advanced L4-5 disc degeneration. Electronically Signed   By: Logan Bores M.D.   On: 06/24/2015 16:20   Ct Head Wo Contrast  06/24/2015  CLINICAL DATA:  Fall from standing while  putting on a shirt. Lost balance and hit the floor, C1 fracture. Initial encounter. EXAM: CT HEAD WITHOUT CONTRAST TECHNIQUE: Contiguous axial images were obtained from the base of the skull through the vertex without intravenous contrast. COMPARISON:  CT cervical spine 06/24/2015 and CT head 01/16/2013. FINDINGS: No evidence of an acute infarct, acute hemorrhage, mass lesion, mass effect or hydrocephalus. Atrophy. Encephalomalacia in the left parietal lobe. Confluent low-attenuation in the periventricular deep white matter. Small remote infarcts in the cerebellar hemispheres. C1 fracture is partially imaged. No additional evidence of an acute fracture. Mandibular condyles are located. Scattered mucosal thickening in the paranasal sinuses. IMPRESSION: 1. No acute intracranial abnormality. 2. C1 fracture partially imaged. Please see dedicated CT cervical spine dictated the same day for further details. 3. Atrophy, chronic microvascular white matter ischemic changes and remote infarcts. Electronically Signed   By: Lorin Picket M.D.   On: 06/24/2015 15:56   Ct Cervical Spine Wo Contrast  06/24/2015  CLINICAL DATA:  Imbalance with fall. Neck pain radiating to the head. EXAM: CT CERVICAL SPINE WITHOUT CONTRAST TECHNIQUE: Multidetector CT imaging of the cervical spine was performed without intravenous contrast. Multiplanar CT image reconstructions were also generated. COMPARISON:  None. FINDINGS: There is a comminuted fracture of the mid anterior C1 arch. There is a comminuted fracture of the mid posterior C1 arch. There is 4 mm lateral subluxation of the right lateral C1 mass relative to the C2 lateral mass. There is 2 mm lateral subluxation of the left C1 mass relative to the left C2 articular mass. There is associated prevertebral soft tissue swelling. There is associated edema and hemorrhage in the transverse ligament causing at least mild anterior canal stenosis at the C1 level. No additional cervical spine  fracture. There is straightening of the cervical spine, usually due to positioning and/or muscle spasm. There is severe degenerative disc disease throughout the cervical spine, most prominent at C3-4, C5-6 and C6-7 with ankylosis at C6-7. There is minimal 2 mm anterolisthesis at C2-3, minimal 2 mm retrolisthesis at C4-5 and mild 3 mm anterolisthesis at C7-T1, likely degenerative. There is moderate foraminal stenosis on the right at C2-3. There is mild foraminal stenosis on the left at C3-4 and on the left at C5-6. Visualized mastoid air cells appear clear. No evidence of intra-axial hemorrhage in the visualized brain. Visualized lung apices are clear. No appreciable cervical adenopathy. There is atherosclerotic calcification in the bilateral extracranial carotid arteries. Scout topogram demonstrates partially visualized multi lead left subclavian ICD and intact median sternotomy wires. IMPRESSION: 1. Comminuted mid anterior and posterior C1 arch fractures, which appear acute, with mild  lateral subluxation of the lateral C1 masses relative to the lateral C2 masses bilaterally. 2. Prevertebral and transverse ligament edema/hemorrhage causing at least mild anterior canal stenosis at the C1 level. 3. Severe degenerative disc disease and minimal multilevel degenerative spondylolisthesis throughout the cervical spine. These results were called by telephone at the time of interpretation on 06/24/2015 at 2:24 pm to Dr. Joseph Berkshire , who verbally acknowledged these results. Electronically Signed   By: Ilona Sorrel M.D.   On: 06/24/2015 14:27   Dg Finger Ring Left  06/24/2015  CLINICAL DATA:  Left fourth finger laceration after fall. EXAM: LEFT RING FINGER 2+V COMPARISON:  None. FINDINGS: No fracture, dislocation or suspicious focal osseous lesion. Joint spaces appear normal. There are mild curvilinear soft tissue calcifications along the radial aspect of the fourth finger. IMPRESSION: 1. No fracture or  malalignment in the left fourth finger. 2. Mild curvilinear soft tissue calcifications along the radial aspect of the left fourth finger, nonspecific, possibly due to gout or an inflammatory condition such as polymyositis/dermatomyositis. Electronically Signed   By: Ilona Sorrel M.D.   On: 06/24/2015 16:17    Oren Binet, MD  [facility] Pager:[phone]  If 7PM-7AM, please contact night-coverage [email or web address] Password [email or web address] 06/27/2015, 11:41 AM

## 2015-06-27 NOTE — Progress Notes (Signed)
Pt has no bed offers today.  CSW will continue to work to secure bed offer on behalf of this patient.  Jeral Fruit Eddye Broxterman,LCSW W/E Coverage [phone]

## 2015-06-27 NOTE — Progress Notes (Signed)
[facility] liasion will be coming to hospital to assess pt in am for possible SNF admission.  CSW will continue to follow.

## 2015-06-27 NOTE — Progress Notes (Signed)
CSW actively working on ([facility] on behalf of pt.  CSW has been in contact with [facility] re: ? Admission for pt.  She will call pt's HCPOA today to discuss pt's financial situation/LTC Medicaid application.  CSW will continue to follow.

## 2015-06-28 DIAGNOSIS — S12000K Unspecified displaced fracture of first cervical vertebra, subsequent encounter for fracture with nonunion: Secondary | ICD-10-CM | POA: Diagnosis not present

## 2015-06-28 DIAGNOSIS — I25810 Atherosclerosis of coronary artery bypass graft(s) without angina pectoris: Secondary | ICD-10-CM | POA: Diagnosis not present

## 2015-06-28 DIAGNOSIS — M25461 Effusion, right knee: Secondary | ICD-10-CM | POA: Diagnosis not present

## 2015-06-28 DIAGNOSIS — I472 Ventricular tachycardia: Secondary | ICD-10-CM | POA: Diagnosis not present

## 2015-06-28 DIAGNOSIS — I480 Paroxysmal atrial fibrillation: Secondary | ICD-10-CM

## 2015-06-28 DIAGNOSIS — S12000A Unspecified displaced fracture of first cervical vertebra, initial encounter for closed fracture: Secondary | ICD-10-CM | POA: Diagnosis not present

## 2015-06-28 DIAGNOSIS — E119 Type 2 diabetes mellitus without complications: Secondary | ICD-10-CM | POA: Diagnosis not present

## 2015-06-28 DIAGNOSIS — I5022 Chronic systolic (congestive) heart failure: Secondary | ICD-10-CM | POA: Diagnosis not present

## 2015-06-28 DIAGNOSIS — I4891 Unspecified atrial fibrillation: Secondary | ICD-10-CM | POA: Diagnosis not present

## 2015-06-28 DIAGNOSIS — F05 Delirium due to known physiological condition: Secondary | ICD-10-CM | POA: Diagnosis not present

## 2015-06-28 DIAGNOSIS — R259 Unspecified abnormal involuntary movements: Secondary | ICD-10-CM | POA: Diagnosis not present

## 2015-06-28 DIAGNOSIS — R410 Disorientation, unspecified: Secondary | ICD-10-CM | POA: Diagnosis not present

## 2015-06-28 DIAGNOSIS — S12500A Unspecified displaced fracture of sixth cervical vertebra, initial encounter for closed fracture: Secondary | ICD-10-CM | POA: Diagnosis not present

## 2015-06-28 DIAGNOSIS — I484 Atypical atrial flutter: Secondary | ICD-10-CM | POA: Diagnosis not present

## 2015-06-28 DIAGNOSIS — S12091D Other nondisplaced fracture of first cervical vertebra, subsequent encounter for fracture with routine healing: Secondary | ICD-10-CM | POA: Diagnosis not present

## 2015-06-28 DIAGNOSIS — S12000D Unspecified displaced fracture of first cervical vertebra, subsequent encounter for fracture with routine healing: Secondary | ICD-10-CM | POA: Diagnosis not present

## 2015-06-28 DIAGNOSIS — M109 Gout, unspecified: Secondary | ICD-10-CM | POA: Diagnosis not present

## 2015-06-28 DIAGNOSIS — E785 Hyperlipidemia, unspecified: Secondary | ICD-10-CM | POA: Diagnosis not present

## 2015-06-28 DIAGNOSIS — I2510 Atherosclerotic heart disease of native coronary artery without angina pectoris: Secondary | ICD-10-CM | POA: Diagnosis not present

## 2015-06-28 DIAGNOSIS — I10 Essential (primary) hypertension: Secondary | ICD-10-CM | POA: Diagnosis not present

## 2015-06-28 DIAGNOSIS — I43 Cardiomyopathy in diseases classified elsewhere: Secondary | ICD-10-CM | POA: Diagnosis not present

## 2015-06-28 DIAGNOSIS — T148 Other injury of unspecified body region: Secondary | ICD-10-CM | POA: Diagnosis not present

## 2015-06-28 DIAGNOSIS — E784 Other hyperlipidemia: Secondary | ICD-10-CM | POA: Diagnosis not present

## 2015-06-28 DIAGNOSIS — K210 Gastro-esophageal reflux disease with esophagitis: Secondary | ICD-10-CM | POA: Diagnosis not present

## 2015-06-28 DIAGNOSIS — I951 Orthostatic hypotension: Secondary | ICD-10-CM | POA: Diagnosis not present

## 2015-06-28 MED ORDER — LORAZEPAM 0.5 MG PO TABS: 0.5000 mg | ORAL_TABLET | Freq: Two times a day (BID) | ORAL | Status: DC

## 2015-06-28 MED ORDER — ACETAMINOPHEN 500 MG PO TABS: 1000.0000 mg | ORAL_TABLET | Freq: Three times a day (TID) | ORAL | Status: DC

## 2015-06-28 MED ORDER — OXYCODONE HCL 5 MG PO TABS: 5.0000 mg | ORAL_TABLET | Freq: Four times a day (QID) | ORAL | Status: DC | PRN

## 2015-06-28 MED ORDER — DIVALPROEX SODIUM 125 MG PO CSDR: 250.0000 mg | DELAYED_RELEASE_CAPSULE | Freq: Every day | ORAL | Status: DC

## 2015-06-28 MED ORDER — ESCITALOPRAM OXALATE 10 MG PO TABS: 10.0000 mg | ORAL_TABLET | Freq: Every day | ORAL | Status: DC

## 2015-06-28 MED ORDER — CARVEDILOL 6.25 MG PO TABS: 6.2500 mg | ORAL_TABLET | Freq: Two times a day (BID) | ORAL | Status: DC

## 2015-06-28 NOTE — Progress Notes (Signed)
Patient ready for transport to [facility]; report called to receiving RN; ambulance transport discharging with patient via stretcher. All belongings returned from room; patient has his dentures;top and bottom denture in his mouth for discharge.

## 2015-06-28 NOTE — Progress Notes (Signed)
CSW continues to pursue placement at [facility].  Pt should go into facility under his hospice benefit with an LOG paying for room and board until Medicaid is approved.  Facility unable to verify his [facility] Medicare coverage yesterday and has asked for a copy of his insurance card which has been provided.  [facility] liasion has left message for pt's hospice case manager re: the status of pt's hospice benefit status.  Liasion to be here at 1:00 to meet with pt, who is much clearer today.  CSW will follow closely for disposition.

## 2015-06-28 NOTE — Discharge Summary (Signed)
PATIENT DETAILS Name: [name] Age: 82 y.o. Sex: male Date of Birth: 1927-08-22 MRN: [id number]. Admitting Physician: Debbe Odea, MD [id number], Nicki Reaper, MD  Admit Date: 06/24/2015 Discharge date: 06/28/2015  Recommendations for Outpatient Follow-up:  1. Consider palliative care follow-up while at SNF 2. Please repeat CBC/BMET in 2 weeks 3. Please ensure follow-up with Dr. Taylor-cardiology 4. Keep cervical collar in place till seen by neurosurgery 5. Has chronic pancytopenia-with worsening anemia-stable for outpatient work up if felt appropriate by PCP  PRIMARY DISCHARGE DIAGNOSIS:  Principal Problem:   C1 cervical fracture (HCC) Active Problems:   Coronary atherosclerosis   Chronic systolic heart failure (HCC)   Diabetes mellitus (HCC)   Anemia   Laceration   Intractable pain   A-fib (HCC)   VT (ventricular tachycardia) (HCC)      PAST MEDICAL HISTORY: Past Medical History  Diagnosis Date  . CAD (coronary artery disease)     s/p CABG; s/p Pacemaker  . Systolic CHF with reduced left ventricular function, NYHA class 2 ([place])   . Ischemic cardiomyopathy     s/p ICD  . Hypertension   . Hyperlipidemia   . Gout   . Paroxysmal ventricular tachycardia ([place])   . Diverticulosis of colon   . AICD (automatic cardioverter/defibrillator) present   . Arthritis     "shoulders" (10/31/2014)  . On home oxygen therapy     "2L prn" (10/31/2014)  . Shortness of breath dyspnea     with exertion  . GERD (gastroesophageal reflux disease)   . SBO (small bowel obstruction) ([place]) 10/31/2014    DISCHARGE MEDICATIONS: Current Discharge Medication List    START taking these medications   Details  divalproex (DEPAKOTE SPRINKLE) 125 MG capsule Take 2 capsules (250 mg total) by mouth at bedtime. Qty: 20 capsule, Refills: 0    oxyCODONE (OXY IR/ROXICODONE) 5 MG immediate release tablet Take 1 tablet (5 mg total) by mouth every 6 (six) hours as needed for moderate pain. Qty:  30 tablet, Refills: 0      CONTINUE these medications which have CHANGED   Details  acetaminophen (TYLENOL) 500 MG tablet Take 2 tablets (1,000 mg total) by mouth 3 (three) times daily.    carvedilol (COREG) 6.25 MG tablet Take 1 tablet (6.25 mg total) by mouth 2 (two) times daily.    escitalopram (LEXAPRO) 10 MG tablet Take 1 tablet (10 mg total) by mouth daily. Refills: 0    LORazepam (ATIVAN) 0.5 MG tablet Take 1 tablet (0.5 mg total) by mouth 2 (two) times daily. Qty: 20 tablet, Refills: 0      CONTINUE these medications which have NOT CHANGED   Details  allopurinol (ZYLOPRIM) 300 MG tablet Take 300 mg by mouth daily.      atorvastatin (LIPITOR) 20 MG tablet Take 20 mg by mouth daily.      docusate sodium (COLACE) 100 MG capsule Take 100 mg by mouth 2 (two) times daily.    esomeprazole (NEXIUM) 20 MG capsule Take 20 mg by mouth daily at 12 noon.    famotidine (PEPCID) 20 MG tablet Take 20 mg by mouth daily.     furosemide (LASIX) 20 MG tablet Take 20 mg by mouth daily.    iron polysaccharides (NIFEREX) 150 MG capsule Take 150 mg by mouth daily.    NITROSTAT 0.4 MG SL tablet Place 0.4 mg under the tongue every 5 (five) minutes as needed for chest pain (MAX 3 TABLETS).    Associated Diagnoses: Chronic systolic heart failure (  [place]); Automatic implantable cardiac defibrillator in situ; Cardiomyopathy, ischemic; Paroxysmal VT (HCC)    OXYGEN Inhale 3 L into the lungs daily as needed (shortness of breath).    PATADAY 0.2 % SOLN Place 1 drop into both eyes daily.     RA ASPIRIN ADULT LOW STRENGTH 81 MG EC tablet Take 1 tablet by mouth daily. Refills: 0    sucralfate (CARAFATE) 1 G tablet Take 1 g by mouth 2 (two) times daily.     tiZANidine (ZANAFLEX) 2 MG tablet Take 1 tablet (2 mg total) by mouth every 8 (eight) hours as needed for muscle spasms. Qty: 30 tablet, Refills: 0      STOP taking these medications     HYDROcodone-acetaminophen (NORCO/VICODIN) 5-325 MG tablet           ALLERGIES:  No Known Allergies  BRIEF HPI:  See H&P, Labs, Consult and Test reports for all details in brief, patient was admitted for evaluation of her syncopal episode  CONSULTATIONS:   cardiology  PERTINENT RADIOLOGIC STUDIES: Dg Chest 2 View  06/18/2015  CLINICAL DATA:  Right shoulder and neck pain EXAM: CHEST  2 VIEW COMPARISON:  10/31/2014 FINDINGS: Chronic cardiopericardial enlargement. ICD/pacer leads from the left, with 2 right ventricular leads and a direct epicardial lead on the left in stable position. Unchanged aortic and hilar contours. Obscured right base, likely atelectasis given bandlike opacity below the right minor fissure. Small pleural effusions. No edema or pneumothorax. Severe right glenohumeral osteoarthritis with bilateral high humeral head, consistent with chronic rotator cuff tear. IMPRESSION: Trace effusions with mild right basilar atelectasis. Electronically Signed   By: Monte Fantasia M.D.   On: 06/18/2015 12:09   Dg Lumbar Spine 2-3 Views  06/24/2015  CLINICAL DATA:  Fall today. Known cervical spine fracture. Initial encounter. EXAM: LUMBAR SPINE - 2-3 VIEW COMPARISON:  CT abdomen and pelvis 10/31/2014 FINDINGS: There are 5 non rib-bearing lumbar type vertebral bodies. Grade 1 anterolisthesis is again seen of L4 on L5 and measures approximately 8 mm, unchanged and facet mediated. Severe L4-5 disc space narrowing and L5 superior endplate Schmorl's node deformity are similar to the prior CT. There is no evidence of acute fracture. Diffuse aortoiliac atherosclerotic calcification is noted. Phleboliths are present in the pelvis. IMPRESSION: 1. No acute osseous abnormality identified in the lumbar spine. 2. Unchanged anterolisthesis of L4 on L5 and advanced L4-5 disc degeneration. Electronically Signed   By: Logan Bores M.D.   On: 06/24/2015 16:20   Ct Head Wo Contrast  06/24/2015  CLINICAL DATA:  Fall from standing while putting on a shirt. Lost balance  and hit the floor, C1 fracture. Initial encounter. EXAM: CT HEAD WITHOUT CONTRAST TECHNIQUE: Contiguous axial images were obtained from the base of the skull through the vertex without intravenous contrast. COMPARISON:  CT cervical spine 06/24/2015 and CT head 01/16/2013. FINDINGS: No evidence of an acute infarct, acute hemorrhage, mass lesion, mass effect or hydrocephalus. Atrophy. Encephalomalacia in the left parietal lobe. Confluent low-attenuation in the periventricular deep white matter. Small remote infarcts in the cerebellar hemispheres. C1 fracture is partially imaged. No additional evidence of an acute fracture. Mandibular condyles are located. Scattered mucosal thickening in the paranasal sinuses. IMPRESSION: 1. No acute intracranial abnormality. 2. C1 fracture partially imaged. Please see dedicated CT cervical spine dictated the same day for further details. 3. Atrophy, chronic microvascular white matter ischemic changes and remote infarcts. Electronically Signed   By: Lorin Picket M.D.   On: 06/24/2015 15:56  Ct Cervical Spine Wo Contrast  06/24/2015  CLINICAL DATA:  Imbalance with fall. Neck pain radiating to the head. EXAM: CT CERVICAL SPINE WITHOUT CONTRAST TECHNIQUE: Multidetector CT imaging of the cervical spine was performed without intravenous contrast. Multiplanar CT image reconstructions were also generated. COMPARISON:  None. FINDINGS: There is a comminuted fracture of the mid anterior C1 arch. There is a comminuted fracture of the mid posterior C1 arch. There is 4 mm lateral subluxation of the right lateral C1 mass relative to the C2 lateral mass. There is 2 mm lateral subluxation of the left C1 mass relative to the left C2 articular mass. There is associated prevertebral soft tissue swelling. There is associated edema and hemorrhage in the transverse ligament causing at least mild anterior canal stenosis at the C1 level. No additional cervical spine fracture. There is straightening of  the cervical spine, usually due to positioning and/or muscle spasm. There is severe degenerative disc disease throughout the cervical spine, most prominent at C3-4, C5-6 and C6-7 with ankylosis at C6-7. There is minimal 2 mm anterolisthesis at C2-3, minimal 2 mm retrolisthesis at C4-5 and mild 3 mm anterolisthesis at C7-T1, likely degenerative. There is moderate foraminal stenosis on the right at C2-3. There is mild foraminal stenosis on the left at C3-4 and on the left at C5-6. Visualized mastoid air cells appear clear. No evidence of intra-axial hemorrhage in the visualized brain. Visualized lung apices are clear. No appreciable cervical adenopathy. There is atherosclerotic calcification in the bilateral extracranial carotid arteries. Scout topogram demonstrates partially visualized multi lead left subclavian ICD and intact median sternotomy wires. IMPRESSION: 1. Comminuted mid anterior and posterior C1 arch fractures, which appear acute, with mild lateral subluxation of the lateral C1 masses relative to the lateral C2 masses bilaterally. 2. Prevertebral and transverse ligament edema/hemorrhage causing at least mild anterior canal stenosis at the C1 level. 3. Severe degenerative disc disease and minimal multilevel degenerative spondylolisthesis throughout the cervical spine. These results were called by telephone at the time of interpretation on 06/24/2015 at 2:24 pm to Dr. Joseph Berkshire , who verbally acknowledged these results. Electronically Signed   By: Ilona Sorrel M.D.   On: 06/24/2015 14:27   Dg Finger Ring Left  06/24/2015  CLINICAL DATA:  Left fourth finger laceration after fall. EXAM: LEFT RING FINGER 2+V COMPARISON:  None. FINDINGS: No fracture, dislocation or suspicious focal osseous lesion. Joint spaces appear normal. There are mild curvilinear soft tissue calcifications along the radial aspect of the fourth finger. IMPRESSION: 1. No fracture or malalignment in the left fourth finger. 2.  Mild curvilinear soft tissue calcifications along the radial aspect of the left fourth finger, nonspecific, possibly due to gout or an inflammatory condition such as polymyositis/dermatomyositis. Electronically Signed   By: Ilona Sorrel M.D.   On: 06/24/2015 16:17     PERTINENT LAB RESULTS: CBC: No results for input(s): WBC, HGB, HCT, PLT in the last 72 hours. CMET CMP     Component Value Date/Time   NA 141 06/24/2015 1500   K 4.0 06/24/2015 1500   CL 109 06/24/2015 1500   CO2 23 06/24/2015 1500   GLUCOSE 130* 06/24/2015 1500   BUN 16 06/24/2015 1500   CREATININE 1.24 06/24/2015 1500   CALCIUM 8.9 06/24/2015 1500   PROT 6.9 06/18/2015 1059   ALBUMIN 4.0 06/18/2015 1059   AST 42* 06/18/2015 1059   ALT 16* 06/18/2015 1059   ALKPHOS 70 06/18/2015 1059   BILITOT 1.2 06/18/2015 1059   GFRNONAA 51*  06/24/2015 1500   GFRAA 59* 06/24/2015 1500    GFR Estimated Creatinine Clearance: 35.8 mL/min (by C-G formula based on Cr of 1.24). No results for input(s): LIPASE, AMYLASE in the last 72 hours. No results for input(s): CKTOTAL, CKMB, CKMBINDEX, TROPONINI in the last 72 hours. Invalid input(s): POCBNP No results for input(s): DDIMER in the last 72 hours. No results for input(s): HGBA1C in the last 72 hours. No results for input(s): CHOL, HDL, LDLCALC, TRIG, CHOLHDL, LDLDIRECT in the last 72 hours. No results for input(s): TSH, T4TOTAL, T3FREE, THYROIDAB in the last 72 hours.  Invalid input(s): FREET3 No results for input(s): VITAMINB12, FOLATE, FERRITIN, TIBC, IRON, RETICCTPCT in the last 72 hours. Coags: No results for input(s): INR in the last 72 hours.  Invalid input(s): PT Microbiology: No results found for this or any previous visit (from the past 240 hour(s)).   BRIEF HOSPITAL COURSE:  Syncope due to Ventricular Tachycardia: Found on AICD interrogation-patient was defibrillated. Patient is a poor historian-and has no recollection of any shocks that were delivered. EPS  consulted-recommendations are to increase beta blocker. Per EPS-patient will follow-up with his primary cardiologist-Dr. [name] for further discussion regarding hospice/turning off the AICD.  Active Problems: Delirium: Much improved-awake and alert. Suspect may have dementia at baseline-worsened by narcotics/benzodiazepines. Much improved, continue lorazepam and Depakote.  C1 cervical fracture: Secondary to fall due to above. Seen by neurosurgery, recommendations are to continue cervical collar and have outpatient neurosurgical follow-up. Continue supportive care with scheduled Tylenol,  attempt to minimize narcotics as much as possible. .  Orthostatic hypotension: Difficult situation-cardiology increased beta blocker-for V. tach. Please TED hose and monitor.  Laceration of forehead and left ring finger: Sutured in the emergency room, given tdap-will need follow-up with PCP in one week to remove sutures  Chronic systolic heart failure: Clinically compensated-continue Coreg and Lasix. Suspect given orthostatic hypotension/soft BP-no room to add ACE inhibitor or moreDiuretics.  AICD in place: See above  History of atrial flutter: Previously on anticoagulation with Xarelto-apparently discontinued prior to this admission. Clearly not a candidate for anticoagulation given fall risk/C1 fracture. Continue Coreg  Anemia: Appears chronic-but hemoglobin seems to be slightly usual baseline evidence of blood loss stable for outpatient workup.  Thrombocytopenia/leukopenia: Appears chronic-stable for outpatient workup  History of CAD: Status post CABG and PCI-continue aspirin, statin, Coreg  GERD: PPI  [facility]   TODAY-DAY OF DISCHARGE:  Subjective:   Almalik Weissberg today has no headache,no chest abdominal pain,no new weakness tingling or numbness, feels much better wants to go home today.   Objective:   Blood pressure 124/59, pulse 59, temperature 97.5 F  (36.4 C), temperature source Oral, resp. rate 16, height 5\' 4"  (1.626 m), weight 68.975 kg (152 lb 1 oz), SpO2 98 %.  Intake/Output Summary (Last 24 hours) at 06/28/15 1207 Last data filed at 06/28/15 0849  Gross per 24 hour  Intake    720 ml  Output    250 ml  Net    470 ml   Filed Weights   06/24/15 1150 06/24/15 1930 06/28/15 0500  Weight: 71.668 kg (158 lb) 69.6 kg (153 lb 7 oz) 68.975 kg (152 lb 1 oz)    Exam Awake Alert, Oriented *3, No new F.N deficits, Normal affect [place].AT,PERRAL Supple Neck,No JVD, No cervical lymphadenopathy appriciated.  Symmetrical Chest wall movement, Good air movement bilaterally, CTAB RRR,No Gallops,Rubs or new Murmurs, No Parasternal Heave +ve B.Sounds, Abd Soft, Non tender, No organomegaly appriciated, No  rebound -guarding or rigidity. No Cyanosis, Clubbing or edema, No new Rash or bruise  DISCHARGE CONDITION: Stable  DISPOSITION: SNF  DISCHARGE INSTRUCTIONS:    Activity:  As tolerated with Full fall precautions use walker/cane & assistance as needed  Get Medicines reviewed and adjusted: Please take all your medications with you for your next visit with your Primary MD  Please request your Primary MD to go over all hospital tests and procedure/radiological results at the follow up, please ask your Primary MD to get all Hospital records sent to his/her office.  If you experience worsening of your admission symptoms, develop shortness of breath, life threatening emergency, suicidal or homicidal thoughts you must seek medical attention immediately by calling 911 or calling your MD immediately  if symptoms less severe.  You must read complete instructions/literature along with all the possible adverse reactions/side effects for all the Medicines you take and that have been prescribed to you. Take any new Medicines after you have completely understood and accpet all the possible adverse reactions/side effects.   Do not drive when taking Pain  medications.   Do not take more than prescribed Pain, Sleep and Anxiety Medications  Special Instructions: If you have smoked or chewed Tobacco  in the last 2 yrs please stop smoking, stop any regular Alcohol  and or any Recreational drug use.  Wear Seat belts while driving.  Please note  You were cared for by a hospitalist during your hospital stay. Once you are discharged, your primary care physician will handle any further medical issues. Please note that NO REFILLS for any discharge medications will be authorized once you are discharged, as it is imperative that you return to your primary care physician (or establish a relationship with a primary care physician if you do not have one) for your aftercare needs so that they can reassess your need for medications and monitor your lab values.   Diet recommendation: Heart Healthy diet Fluid restriction 1.5 lit/day Aspiration precautions:yes  Discharge Instructions    (HEART FAILURE PATIENTS) Call MD:  Anytime you have any of the following symptoms: 1) 3 pound weight gain in 24 hours or 5 pounds in 1 week 2) shortness of breath, with or without a dry hacking cough 3) swelling in the hands, feet or stomach 4) if you have to sleep on extra pillows at night in order to breathe.    Complete by:  As directed      Call MD for:  difficulty breathing, headache or visual disturbances    Complete by:  As directed      Call MD for:  severe uncontrolled pain    Complete by:  As directed      Diet - low sodium heart healthy    Complete by:  As directed      Discharge instructions    Complete by:  As directed   Keep cervical collar in place until seen by neurosurgery     Increase activity slowly    Complete by:  As directed            Follow-up Information    Follow up with [name], MD. Schedule an appointment as soon as possible for a visit in 2 weeks.   Specialty:  Internal Medicine   Contact information:   [address] [place] [address] [phone]       Follow up with Cristopher Peru, MD. Schedule an appointment as soon as possible for a visit in 2 weeks.  Specialty:  Cardiology   Contact information:   [address] [place] [address] [phone]       Total Time spent on discharge equals 45 minutes.  SignedOren Binet 06/28/2015 12:07 PM

## 2015-06-28 NOTE — Progress Notes (Signed)
Pt has been accepted for transfer to [facility] today.  CSW to prepare paperwork and arrange transport, as necessary.

## 2015-06-28 NOTE — Clinical Social Work Placement (Signed)
   CLINICAL SOCIAL WORK PLACEMENT  NOTE  Date:  06/28/2015  Patient Details  Name: [name] MRN: [id number] Date of Birth: May 02, 1927  Clinical Social Work is seeking post-discharge placement for this patient at the [place] level of care (*CSW will initial, date and re-position this form in  chart as items are completed):  Yes   Patient/family provided with [facility] Work Department's list of facilities offering this level of care within the geographic area requested by the patient (or if unable, by the patient's family).  Yes   Patient/family informed of their freedom to choose among providers that offer the needed level of care, that participate in Medicare, Medicaid or managed care program needed by the patient, have an available bed and are willing to accept the patient.  Yes   Patient/family informed of [facility]'s ownership interest in [facility] and [facility], as well as of the fact that they are under no obligation to receive care at these facilities.  PASRR submitted to EDS on       PASRR number received on       Existing PASRR number confirmed on 06/26/15     FL2 transmitted to all facilities in geographic area requested by pt/family on 06/26/15     FL2 transmitted to all facilities within larger geographic area on       Patient informed that his/her managed care company has contracts with or will negotiate with certain facilities, including the following:            Patient/family informed of bed offers received.  Patient chooses bed at     [facility] recommends and patient chooses bed at      Patient to be transferred to   on  .06/28/15 to [place]  Patient to be transferred to facility by     ptar  Patient family notified on   of transfer.yes  Name of family member notified:      [name] [email or web address]  PHYSICIAN       Additional Comment:     _______________________________________________ Roanna Raider, LCSW 06/28/2015, 1:39 PM

## 2015-06-28 NOTE — Progress Notes (Signed)
PATIENT DETAILS Name: [name] Age: 81 y.o. Sex: male Date of Birth: 14-Jul-1927 Admit Date: 06/24/2015 Admitting Physician Debbe Odea, MD [id number], Nicki Reaper, MD  Subjective: Common quiet-awake and alert.  Assessment/Plan: Principal Problem: Syncope due to Ventricular Tachycardia: Found on AICD interrogation-patient was defibrillated. Patient is a poor historian-and has no recollection of any shocks that were delivered. EPS consulted-recommendations are to increase beta blocker. Per EPS-patient will follow-up with his primary cardiologist-Dr. [name] for further discussion regarding hospice/turning off the AICD.  Active Problems: Delirium: Much improved. Suspect dementia at baseline-worsened by narcotics/benzodiazepines. Much improved, continue lorazepam and Depakote.  C1 cervical fracture: Secondary to fall due to above. Seen by neurosurgery, recommendations are to continue cervical collar and have outpatient neurosurgical follow-up. Continue supportive care with scheduled Tylenol, will try and attempt to minimize narcotics as much as possible. .  Orthostatic hypotension: Difficult situation-cardiology increased beta blocker-for V. tach. Please TED hose and monitor.  Laceration of forehead and left ring finger: Sutured in the emergency room, given tdap-will need follow-up with PCP in one week to remove sutures  Chronic systolic heart failure: Clinically compensated-continue Coreg and Aldactone. Suspect given orthostatic hypotension/soft BP-no room to add ACE inhibitor or moreDiuretics.  AICD in place: See above  History of atrial flutter: Previously on anticoagulation with Xarelto-apparently discontinued prior to this admission. Clearly not a candidate for anticoagulation given fall risk/C1 fracture. Continue Coreg  Anemia: Appears chronic-but hemoglobin seems to be slightly usual baseline evidence of blood loss stable for outpatient  workup.  Thrombocytopenia/leukopenia: Appears chronic-stable for outpatient workup  History of CAD: Status post CABG and PCI-continue aspirin, statin, Coreg  GERD: PPI  DNR/Home Hospice    Disposition: Remain inpatient-SNF when bed available  Antimicrobial agents  See below  Anti-infectives    None      DVT Prophylaxis: Prophylactic Lovenox   Code Status:  DNR  Family Communication None at bedside  Procedures: None  CONSULTS:  cardiology  Time spent 30 minutes-Greater than 50% of this time was spent in counseling, explanation of diagnosis, planning of further management, and coordination of care.  MEDICATIONS: Scheduled Meds: . acetaminophen  1,000 mg Oral TID  . allopurinol  300 mg Oral Daily  . aspirin EC  81 mg Oral Daily  . atorvastatin  20 mg Oral Daily  . carvedilol  6.25 mg Oral BID WC  . divalproex  250 mg Oral QHS  . enoxaparin (LOVENOX) injection  40 mg Subcutaneous Q24H  . escitalopram  10 mg Oral Daily  . famotidine  20 mg Oral Daily  . LORazepam  0.5 mg Oral BID  . olopatadine  1 drop Both Eyes BID  . pantoprazole  40 mg Oral Daily  . spironolactone  12.5 mg Oral Daily  . sucralfate  1 g Oral BID   Continuous Infusions:  PRN Meds:.alum & mag hydroxide-simeth, diphenhydrAMINE, LORazepam, magnesium citrate, ondansetron **OR** ondansetron (ZOFRAN) IV, oxyCODONE, tiZANidine    PHYSICAL EXAM: Vital signs in last 24 hours: Filed Vitals:   06/27/15 2138 06/28/15 0500 06/28/15 0552 06/28/15 1001  BP: 165/60  142/63 124/59  Pulse: 60  65 59  Temp: 98.3 F (36.8 C)  98.2 F (36.8 C) 97.5 F (36.4 C)  TempSrc: Oral  Oral Oral  Resp: 18  18 16   Height:      Weight:  68.975 kg (152 lb 1 oz)    SpO2: 99%  99% 98%  Weight change:  Filed Weights   06/24/15 1150 06/24/15 1930 06/28/15 0500  Weight: 71.668 kg (158 lb) 69.6 kg (153 lb 7 oz) 68.975 kg (152 lb 1 oz)   Body mass index is 26.09 kg/(m^2).   Gen Exam: Awake and alert with  clear speech.   Neck: Cervical collar in place Chest: B/L Clear.   CVS: S1 S2 Regular, no murmurs.  Abdomen: soft, BS +, non tender, non distended.  Extremities: no edema, lower extremities warm to touch. Neurologic: Non Focal.   Skin: No Rash.   Wounds: N/A.    Intake/Output from previous day:  Intake/Output Summary (Last 24 hours) at 06/28/15 1125 Last data filed at 06/28/15 0849  Gross per 24 hour  Intake    720 ml  Output    250 ml  Net    470 ml     LAB RESULTS: CBC  Recent Labs Lab 06/24/15 1500 06/25/15 0407  WBC 3.7* 3.1*  HGB 8.9* 8.7*  HCT 26.4* 26.5*  PLT 126* 137*  MCV 93.6 94.6  MCH 31.6 31.1  MCHC 33.7 32.8  RDW 14.5 14.5  LYMPHSABS 0.5*  --   MONOABS 0.3  --   EOSABS 0.1  --   BASOSABS 0.0  --     Chemistries   Recent Labs Lab 06/24/15 1500  NA 141  K 4.0  CL 109  CO2 23  GLUCOSE 130*  BUN 16  CREATININE 1.24  CALCIUM 8.9    CBG:  Recent Labs Lab 06/24/15 2238  GLUCAP 219*    GFR Estimated Creatinine Clearance: 35.8 mL/min (by C-G formula based on Cr of 1.24).  Coagulation profile No results for input(s): INR, PROTIME in the last 168 hours.  Cardiac Enzymes No results for input(s): CKMB, TROPONINI, MYOGLOBIN in the last 168 hours.  Invalid input(s): CK  Invalid input(s): POCBNP No results for input(s): DDIMER in the last 72 hours. No results for input(s): HGBA1C in the last 72 hours. No results for input(s): CHOL, HDL, LDLCALC, TRIG, CHOLHDL, LDLDIRECT in the last 72 hours. No results for input(s): TSH, T4TOTAL, T3FREE, THYROIDAB in the last 72 hours.  Invalid input(s): FREET3 No results for input(s): VITAMINB12, FOLATE, FERRITIN, TIBC, IRON, RETICCTPCT in the last 72 hours. No results for input(s): LIPASE, AMYLASE in the last 72 hours.  Urine Studies No results for input(s): UHGB, CRYS in the last 72 hours.  Invalid input(s): UACOL, UAPR, USPG, UPH, UTP, UGL, UKET, UBIL, UNIT, UROB, ULEU, UEPI, UWBC, URBC, UBAC,  CAST, UCOM, BILUA  MICROBIOLOGY: No results found for this or any previous visit (from the past 240 hour(s)).  RADIOLOGY STUDIES/RESULTS: Dg Chest 2 View  06/18/2015  CLINICAL DATA:  Right shoulder and neck pain EXAM: CHEST  2 VIEW COMPARISON:  10/31/2014 FINDINGS: Chronic cardiopericardial enlargement. ICD/pacer leads from the left, with 2 right ventricular leads and a direct epicardial lead on the left in stable position. Unchanged aortic and hilar contours. Obscured right base, likely atelectasis given bandlike opacity below the right minor fissure. Small pleural effusions. No edema or pneumothorax. Severe right glenohumeral osteoarthritis with bilateral high humeral head, consistent with chronic rotator cuff tear. IMPRESSION: Trace effusions with mild right basilar atelectasis. Electronically Signed   By: Monte Fantasia M.D.   On: 06/18/2015 12:09   Dg Lumbar Spine 2-3 Views  06/24/2015  CLINICAL DATA:  Fall today. Known cervical spine fracture. Initial encounter. EXAM: LUMBAR SPINE - 2-3 VIEW COMPARISON:  CT abdomen and pelvis 10/31/2014 FINDINGS: There are 5  non rib-bearing lumbar type vertebral bodies. Grade 1 anterolisthesis is again seen of L4 on L5 and measures approximately 8 mm, unchanged and facet mediated. Severe L4-5 disc space narrowing and L5 superior endplate Schmorl's node deformity are similar to the prior CT. There is no evidence of acute fracture. Diffuse aortoiliac atherosclerotic calcification is noted. Phleboliths are present in the pelvis. IMPRESSION: 1. No acute osseous abnormality identified in the lumbar spine. 2. Unchanged anterolisthesis of L4 on L5 and advanced L4-5 disc degeneration. Electronically Signed   By: Logan Bores M.D.   On: 06/24/2015 16:20   Ct Head Wo Contrast  06/24/2015  CLINICAL DATA:  Fall from standing while putting on a shirt. Lost balance and hit the floor, C1 fracture. Initial encounter. EXAM: CT HEAD WITHOUT CONTRAST TECHNIQUE: Contiguous axial  images were obtained from the base of the skull through the vertex without intravenous contrast. COMPARISON:  CT cervical spine 06/24/2015 and CT head 01/16/2013. FINDINGS: No evidence of an acute infarct, acute hemorrhage, mass lesion, mass effect or hydrocephalus. Atrophy. Encephalomalacia in the left parietal lobe. Confluent low-attenuation in the periventricular deep white matter. Small remote infarcts in the cerebellar hemispheres. C1 fracture is partially imaged. No additional evidence of an acute fracture. Mandibular condyles are located. Scattered mucosal thickening in the paranasal sinuses. IMPRESSION: 1. No acute intracranial abnormality. 2. C1 fracture partially imaged. Please see dedicated CT cervical spine dictated the same day for further details. 3. Atrophy, chronic microvascular white matter ischemic changes and remote infarcts. Electronically Signed   By: Lorin Picket M.D.   On: 06/24/2015 15:56   Ct Cervical Spine Wo Contrast  06/24/2015  CLINICAL DATA:  Imbalance with fall. Neck pain radiating to the head. EXAM: CT CERVICAL SPINE WITHOUT CONTRAST TECHNIQUE: Multidetector CT imaging of the cervical spine was performed without intravenous contrast. Multiplanar CT image reconstructions were also generated. COMPARISON:  None. FINDINGS: There is a comminuted fracture of the mid anterior C1 arch. There is a comminuted fracture of the mid posterior C1 arch. There is 4 mm lateral subluxation of the right lateral C1 mass relative to the C2 lateral mass. There is 2 mm lateral subluxation of the left C1 mass relative to the left C2 articular mass. There is associated prevertebral soft tissue swelling. There is associated edema and hemorrhage in the transverse ligament causing at least mild anterior canal stenosis at the C1 level. No additional cervical spine fracture. There is straightening of the cervical spine, usually due to positioning and/or muscle spasm. There is severe degenerative disc disease  throughout the cervical spine, most prominent at C3-4, C5-6 and C6-7 with ankylosis at C6-7. There is minimal 2 mm anterolisthesis at C2-3, minimal 2 mm retrolisthesis at C4-5 and mild 3 mm anterolisthesis at C7-T1, likely degenerative. There is moderate foraminal stenosis on the right at C2-3. There is mild foraminal stenosis on the left at C3-4 and on the left at C5-6. Visualized mastoid air cells appear clear. No evidence of intra-axial hemorrhage in the visualized brain. Visualized lung apices are clear. No appreciable cervical adenopathy. There is atherosclerotic calcification in the bilateral extracranial carotid arteries. Scout topogram demonstrates partially visualized multi lead left subclavian ICD and intact median sternotomy wires. IMPRESSION: 1. Comminuted mid anterior and posterior C1 arch fractures, which appear acute, with mild lateral subluxation of the lateral C1 masses relative to the lateral C2 masses bilaterally. 2. Prevertebral and transverse ligament edema/hemorrhage causing at least mild anterior canal stenosis at the C1 level. 3. Severe degenerative disc disease  and minimal multilevel degenerative spondylolisthesis throughout the cervical spine. These results were called by telephone at the time of interpretation on 06/24/2015 at 2:24 pm to Dr. Joseph Berkshire , who verbally acknowledged these results. Electronically Signed   By: Ilona Sorrel M.D.   On: 06/24/2015 14:27   Dg Finger Ring Left  06/24/2015  CLINICAL DATA:  Left fourth finger laceration after fall. EXAM: LEFT RING FINGER 2+V COMPARISON:  None. FINDINGS: No fracture, dislocation or suspicious focal osseous lesion. Joint spaces appear normal. There are mild curvilinear soft tissue calcifications along the radial aspect of the fourth finger. IMPRESSION: 1. No fracture or malalignment in the left fourth finger. 2. Mild curvilinear soft tissue calcifications along the radial aspect of the left fourth finger, nonspecific,  possibly due to gout or an inflammatory condition such as polymyositis/dermatomyositis. Electronically Signed   By: Ilona Sorrel M.D.   On: 06/24/2015 16:17    Oren Binet, MD  [facility] Pager:[phone]  If 7PM-7AM, please contact night-coverage [email or web address] Password [email or web address] 06/28/2015, 11:25 AM

## 2015-06-29 ENCOUNTER — Encounter: Payer: Self-pay | Admitting: Internal Medicine

## 2015-06-29 ENCOUNTER — Non-Acute Institutional Stay (SKILLED_NURSING_FACILITY): Payer: Medicare Other | Admitting: Internal Medicine

## 2015-06-29 DIAGNOSIS — K2100 Gastro-esophageal reflux disease with esophagitis, without bleeding: Secondary | ICD-10-CM

## 2015-06-29 DIAGNOSIS — I472 Ventricular tachycardia, unspecified: Secondary | ICD-10-CM

## 2015-06-29 DIAGNOSIS — I951 Orthostatic hypotension: Secondary | ICD-10-CM

## 2015-06-29 DIAGNOSIS — S12091D Other nondisplaced fracture of first cervical vertebra, subsequent encounter for fracture with routine healing: Secondary | ICD-10-CM

## 2015-06-29 DIAGNOSIS — I484 Atypical atrial flutter: Secondary | ICD-10-CM

## 2015-06-29 DIAGNOSIS — R410 Disorientation, unspecified: Secondary | ICD-10-CM

## 2015-06-29 DIAGNOSIS — T148 Other injury of unspecified body region: Secondary | ICD-10-CM

## 2015-06-29 DIAGNOSIS — I25810 Atherosclerosis of coronary artery bypass graft(s) without angina pectoris: Secondary | ICD-10-CM

## 2015-06-29 DIAGNOSIS — I5022 Chronic systolic (congestive) heart failure: Secondary | ICD-10-CM | POA: Diagnosis not present

## 2015-06-29 DIAGNOSIS — IMO0002 Reserved for concepts with insufficient information to code with codable children: Secondary | ICD-10-CM

## 2015-06-29 NOTE — Assessment & Plan Note (Signed)
SNF - stable; cont nexium,carafate and pepcid

## 2015-06-29 NOTE — Assessment & Plan Note (Signed)
Status post CABG and PCI SNF - continue aspirin, statin, Coreg

## 2015-06-29 NOTE — Assessment & Plan Note (Signed)
Secondary to fall due to above. Seen by neurosurgery, recommendations are to continue cervical collar and have outpatient neurosurgical follow-up. Continue supportive care with scheduled Tylenol,SNF - attempt to minimize narcotics as much as possible. Marland Kitchen

## 2015-06-29 NOTE — Assessment & Plan Note (Signed)
SNF - chronic, looks like not using substrates as levels are acceptable; will follow CBC; pt is Hospice, outpt w/u prob not appropriate

## 2015-06-29 NOTE — Assessment & Plan Note (Signed)
Difficult situation-cardiology increased beta blocker-for V. tach. SNF -  TED hose and monitor.

## 2015-06-29 NOTE — Assessment & Plan Note (Addendum)
SNF - Clinically compensated-continue Coreg and Lasix, fluid restriction 1.5 liters;. Suspect given orthostatic hypotension/soft BP-no room to add ACE inhibitor or moreDiuretics.

## 2015-06-29 NOTE — Progress Notes (Signed)
MRN: [id number] Name: [name]  Sex: male Age: 81 y.o. DOB: [birth year]  [place] #: Karren Burly Facility/Room:131 Level Of Care: SNF Provider: Inocencio Homes D Emergency Contacts: Extended Emergency Contact Information Primary Emergency Contact: Dala Dock, [place] Phone: [phone] Mobile Phone: [phone] Relation: Friend Secondary Emergency Contact: Jaquelyn Bitter, [place] Phone: [phone] Mobile Phone: [phone] Relation: Friend  Code Status:   Allergies: Review of patient's allergies indicates no known allergies.  Chief Complaint  Patient presents with  . New Admit To SNF    HPI: Patient is 81 y.o. male with hypertension, diabetes mellitus, gastroesophageal reflux disease, ischemic cardiomyopathy and EF of 25% who has an AICD. He presented with a mechanical fall and C1 fx. He was admitted to the hospital from 10/9-23 where it was found that he had had syncope 2/2 v tach. He was seen by cardiology and his course was complicated by hypotension making it difficult to treat his V tach and delirium which has cleared. Pt is admiited to SNF for generalized weakness for OT/PT. While at SNF pt will be followed for CHF, tx with goreg and lasix, atrial flutter/fib, tx with coreg and ASA and CAD, tx with coreg, statin and ASA.  Past Medical History  Diagnosis Date  . CAD (coronary artery disease)     s/p CABG; s/p Pacemaker  . Systolic CHF with reduced left ventricular function, NYHA class 2 ([place])   . Ischemic cardiomyopathy     s/p ICD  . Hypertension   . Hyperlipidemia   . Gout   . Paroxysmal ventricular tachycardia ([place])   . Diverticulosis of colon   . AICD (automatic cardioverter/defibrillator) present   . Arthritis     "shoulders" (10/31/2014)  . On home oxygen therapy     "2L prn" (10/31/2014)  . Shortness of breath dyspnea     with exertion  . GERD (gastroesophageal  reflux disease)   . SBO (small bowel obstruction) ([place]) 10/31/2014    Past Surgical History  Procedure Laterality Date  . Coronary artery bypass graft  03/2003    CABG X3/notes 01/18/2011  . Bowel resection    . Coronary angioplasty with stent placement  04/2011    2 stents/notes 05/03/2011  . Implantable cardioverter defibrillator (icd) generator change Left 10/12/2011    Procedure: ICD GENERATOR CHANGE;  Surgeon: Evans Lance, MD;  Location: [facility] CATH LAB;  Service: Cardiovascular;  Laterality: Left;  . Pacemaker revision N/A 10/12/2011    Procedure: PACEMAKER REVISION;  Surgeon: Evans Lance, MD;  Location: [facility] CATH LAB;  Service: Cardiovascular;  Laterality: N/A;  . Bi-ventricular implantable cardioverter defibrillator upgrade N/A 01/22/2014    Procedure: BI-VENTRICULAR IMPLANTABLE CARDIOVERTER DEFIBRILLATOR UPGRADE;  Surgeon: Evans Lance, MD;  Location: [facility] CATH LAB;  Service: Cardiovascular;  Laterality: N/A;  . Tonsillectomy    . Cholecystectomy    . Cataract extraction w/ intraocular lens  implant, bilateral Bilateral   . Tee with cardioversion  03/2003    Archie Endo 01/18/2011  . Cardiac defibrillator placement  07/2003    Archie Endo 01/18/2011  . Cardiac catheterization  03/2011    Archie Endo 01/18/2011  . Eye surgery Bilateral     [place]  . Cataract extraction w/phaco Right 12/17/2014    Procedure: PHACOEMULSIFICATION CATARACT EXTRACTION WITH IOL IMPLANT RIGHT EYE;  Surgeon: Marylynn Pearson, MD;  Location: [place];  Service:  Ophthalmology;  Laterality: Right;  . Pacemaker placement        Medication List       This list is accurate as of: 06/29/15  9:31 PM.  Always use your most recent med list.               acetaminophen 500 MG tablet  Commonly known as:  TYLENOL  Take 2 tablets (1,000 mg total) by mouth 3 (three) times daily.     allopurinol 300 MG tablet  Commonly known as:  ZYLOPRIM  Take 300 mg by mouth daily.     atorvastatin 20 MG tablet  Commonly known as:  LIPITOR  Take  20 mg by mouth daily.     carvedilol 6.25 MG tablet  Commonly known as:  COREG  Take 1 tablet (6.25 mg total) by mouth 2 (two) times daily.     divalproex 125 MG capsule  Commonly known as:  DEPAKOTE SPRINKLE  Take 2 capsules (250 mg total) by mouth at bedtime.     docusate sodium 100 MG capsule  Commonly known as:  COLACE  Take 100 mg by mouth 2 (two) times daily.     escitalopram 10 MG tablet  Commonly known as:  LEXAPRO  Take 1 tablet (10 mg total) by mouth daily.     esomeprazole 20 MG capsule  Commonly known as:  NEXIUM  Take 20 mg by mouth daily at 12 noon.     famotidine 20 MG tablet  Commonly known as:  PEPCID  Take 20 mg by mouth daily.     furosemide 20 MG tablet  Commonly known as:  LASIX  Take 20 mg by mouth daily.     iron polysaccharides 150 MG capsule  Commonly known as:  NIFEREX  Take 150 mg by mouth daily.     LORazepam 0.5 MG tablet  Commonly known as:  ATIVAN  Take 1 tablet (0.5 mg total) by mouth 2 (two) times daily.     NITROSTAT 0.4 MG SL tablet  Generic drug:  nitroGLYCERIN  Place 0.4 mg under the tongue every 5 (five) minutes as needed for chest pain (MAX 3 TABLETS).     oxyCODONE 5 MG immediate release tablet  Commonly known as:  Oxy IR/ROXICODONE  Take 1 tablet (5 mg total) by mouth every 6 (six) hours as needed for moderate pain.     OXYGEN  Inhale 3 L into the lungs daily as needed (shortness of breath).     PATADAY 0.2 % Soln  Generic drug:  Olopatadine HCl  Place 1 drop into both eyes daily.     RA ASPIRIN ADULT LOW STRENGTH 81 MG EC tablet  Generic drug:  aspirin  Take 1 tablet by mouth daily.     sucralfate 1 G tablet  Commonly known as:  CARAFATE  Take 1 g by mouth 2 (two) times daily.     tiZANidine 2 MG tablet  Commonly known as:  ZANAFLEX  Take 1 tablet (2 mg total) by mouth every 8 (eight) hours as needed for muscle spasms.        No orders of the defined types were placed in this encounter.    Immunization  History  Administered Date(s) Administered  . Tdap 06/24/2015    Social History  Substance Use Topics  . Smoking status: Never Smoker   . Smokeless tobacco: Former Systems developer    Types: Chew     Comment: no chew in over 3 years  . Alcohol  Use: Yes     Comment: "might take a drink during the holidays"    Family history is +HD, DM    Review of Systems  DATA OBTAINED: from patient, nurse GENERAL:  no fevers, fatigue, appetite changes SKIN: No itching, rash or wounds EYES: No eye pain, redness, discharge EARS: No earache, tinnitus, change in hearing NOSE: No congestion, drainage or bleeding  MOUTH/THROAT: No mouth or tooth pain, No sore throat RESPIRATORY: No cough, wheezing, SOB CARDIAC: No chest pain, palpitations, lower extremity edema  GI: No abdominal pain, No N/V/D or constipation, No heartburn or reflux  GU: No dysuria, frequency or urgency, or incontinence  MUSCULOSKELETAL: No unrelieved bone/joint pain NEUROLOGIC: No headache, dizziness or focal weakness PSYCHIATRIC: No c/o anxiety or sadness   Filed Vitals:   06/29/15 1309  BP: 107/68  Pulse: 60  Temp: 98 F (36.7 C)  Resp: 22    SpO2 Readings from Last 1 Encounters:  06/28/15 100%        Physical Exam  GENERAL APPEARANCE: Alert, conversant,  Delightful BM ,No acute distress.  SKIN: No diaphoresis rash HEAD: Normocephalic, atraumatic  EYES: Conjunctiva/lids clear. Pupils round, reactive. EOMs intact.  EARS: External exam WNL, canals clear. Hearing grossly normal.  NOSE: No deformity or discharge.  MOUTH/THROAT: Lips w/o lesions  RESPIRATORY: Breathing is even, unlabored. Lung sounds are clear   CARDIOVASCULAR: Heart RRR no murmurs, rubs or gallops. No peripheral edema.   GASTROINTESTINAL: Abdomen is soft, non-tender, not distended w/ normal bowel sounds. GENITOURINARY: Bladder non tender, not distended  MUSCULOSKELETAL: No abnormal joints or musculature NEUROLOGIC:  Cranial nerves 2-12 grossly intact.  Moves all extremities  PSYCHIATRIC: Mood and affect appropriate to situation, no behavioral issues  Patient Active Problem List   Diagnosis Date Noted  . Acute delirium 06/29/2015  . Orthostatic hypotension 06/29/2015  . VT (ventricular tachycardia) ([place])   . C1 cervical fracture ([place]) 06/24/2015  . Anemia of chronic disease 06/24/2015  . Laceration 06/24/2015  . Intractable pain 06/24/2015  . A-fib ([place]) 06/24/2015  . Hypertension   . GERD (gastroesophageal reflux disease)   . Protein-calorie malnutrition, severe ([place]) 11/02/2014  . Partial small bowel obstruction ([place]) 10/31/2014  . DM type 2 causing vascular disease ([place]) 10/31/2014  . Acute renal failure ([place]) 10/31/2014  . Do not resuscitate 10/31/2014  . Hospice care 10/31/2014  . Small bowel obstruction due to postoperative adhesions ([place]) 10/31/2014  . Acute kidney injury ([place])   . Cardiomyopathy, ischemic   . Atrial flutter ([place]) 09/02/2014  . Acute systolic heart failure ([place]) 01/21/2014  . Chronic systolic heart failure ([place]) 07/23/2009  . Automatic implantable cardioverter-defibrillator in situ 07/23/2009  . Coronary atherosclerosis 07/17/2009  . CORONARY ARTERY BYPASS GRAFT, HX OF 07/17/2009    CBC    Component Value Date/Time   WBC 3.1* 06/25/2015 0407   RBC 2.80* 06/25/2015 0407   RBC 3.01* 06/24/2015 2158   HGB 8.7* 06/25/2015 0407   HCT 26.5* 06/25/2015 0407   PLT 137* 06/25/2015 0407   MCV 94.6 06/25/2015 0407   LYMPHSABS 0.5* 06/24/2015 1500   MONOABS 0.3 06/24/2015 1500   EOSABS 0.1 06/24/2015 1500   BASOSABS 0.0 06/24/2015 1500    CMP     Component Value Date/Time   NA 141 06/24/2015 1500   K 4.0 06/24/2015 1500   CL 109 06/24/2015 1500   CO2 23 06/24/2015 1500   GLUCOSE 130* 06/24/2015 1500   BUN 16 06/24/2015 1500   CREATININE 1.24 06/24/2015 1500  CALCIUM 8.9 06/24/2015 1500   PROT 6.9 06/18/2015 1059   ALBUMIN 4.0 06/18/2015 1059   AST 42* 06/18/2015 1059   ALT 16* 06/18/2015  1059   ALKPHOS 70 06/18/2015 1059   BILITOT 1.2 06/18/2015 1059   GFRNONAA 51* 06/24/2015 1500   GFRAA 59* 06/24/2015 1500    Lab Results  Component Value Date   HGBA1C 6.0* 06/24/2015     Dg Lumbar Spine 2-3 Views  06/24/2015  CLINICAL DATA:  Fall today. Known cervical spine fracture. Initial encounter. EXAM: LUMBAR SPINE - 2-3 VIEW COMPARISON:  CT abdomen and pelvis 10/31/2014 FINDINGS: There are 5 non rib-bearing lumbar type vertebral bodies. Grade 1 anterolisthesis is again seen of L4 on L5 and measures approximately 8 mm, unchanged and facet mediated. Severe L4-5 disc space narrowing and L5 superior endplate Schmorl's node deformity are similar to the prior CT. There is no evidence of acute fracture. Diffuse aortoiliac atherosclerotic calcification is noted. Phleboliths are present in the pelvis. IMPRESSION: 1. No acute osseous abnormality identified in the lumbar spine. 2. Unchanged anterolisthesis of L4 on L5 and advanced L4-5 disc degeneration. Electronically Signed   By: Logan Bores M.D.   On: 06/24/2015 16:20   Ct Head Wo Contrast  06/24/2015  CLINICAL DATA:  Fall from standing while putting on a shirt. Lost balance and hit the floor, C1 fracture. Initial encounter. EXAM: CT HEAD WITHOUT CONTRAST TECHNIQUE: Contiguous axial images were obtained from the base of the skull through the vertex without intravenous contrast. COMPARISON:  CT cervical spine 06/24/2015 and CT head 01/16/2013. FINDINGS: No evidence of an acute infarct, acute hemorrhage, mass lesion, mass effect or hydrocephalus. Atrophy. Encephalomalacia in the left parietal lobe. Confluent low-attenuation in the periventricular deep white matter. Small remote infarcts in the cerebellar hemispheres. C1 fracture is partially imaged. No additional evidence of an acute fracture. Mandibular condyles are located. Scattered mucosal thickening in the paranasal sinuses. IMPRESSION: 1. No acute intracranial abnormality. 2. C1 fracture  partially imaged. Please see dedicated CT cervical spine dictated the same day for further details. 3. Atrophy, chronic microvascular white matter ischemic changes and remote infarcts. Electronically Signed   By: Lorin Picket M.D.   On: 06/24/2015 15:56   Ct Cervical Spine Wo Contrast  06/24/2015  CLINICAL DATA:  Imbalance with fall. Neck pain radiating to the head. EXAM: CT CERVICAL SPINE WITHOUT CONTRAST TECHNIQUE: Multidetector CT imaging of the cervical spine was performed without intravenous contrast. Multiplanar CT image reconstructions were also generated. COMPARISON:  None. FINDINGS: There is a comminuted fracture of the mid anterior C1 arch. There is a comminuted fracture of the mid posterior C1 arch. There is 4 mm lateral subluxation of the right lateral C1 mass relative to the C2 lateral mass. There is 2 mm lateral subluxation of the left C1 mass relative to the left C2 articular mass. There is associated prevertebral soft tissue swelling. There is associated edema and hemorrhage in the transverse ligament causing at least mild anterior canal stenosis at the C1 level. No additional cervical spine fracture. There is straightening of the cervical spine, usually due to positioning and/or muscle spasm. There is severe degenerative disc disease throughout the cervical spine, most prominent at C3-4, C5-6 and C6-7 with ankylosis at C6-7. There is minimal 2 mm anterolisthesis at C2-3, minimal 2 mm retrolisthesis at C4-5 and mild 3 mm anterolisthesis at C7-T1, likely degenerative. There is moderate foraminal stenosis on the right at C2-3. There is mild foraminal stenosis on the left at C3-4  and on the left at C5-6. Visualized mastoid air cells appear clear. No evidence of intra-axial hemorrhage in the visualized brain. Visualized lung apices are clear. No appreciable cervical adenopathy. There is atherosclerotic calcification in the bilateral extracranial carotid arteries. Scout topogram demonstrates  partially visualized multi lead left subclavian ICD and intact median sternotomy wires. IMPRESSION: 1. Comminuted mid anterior and posterior C1 arch fractures, which appear acute, with mild lateral subluxation of the lateral C1 masses relative to the lateral C2 masses bilaterally. 2. Prevertebral and transverse ligament edema/hemorrhage causing at least mild anterior canal stenosis at the C1 level. 3. Severe degenerative disc disease and minimal multilevel degenerative spondylolisthesis throughout the cervical spine. These results were called by telephone at the time of interpretation on 06/24/2015 at 2:24 pm to Dr. Joseph Berkshire , who verbally acknowledged these results. Electronically Signed   By: Ilona Sorrel M.D.   On: 06/24/2015 14:27   Dg Finger Ring Left  06/24/2015  CLINICAL DATA:  Left fourth finger laceration after fall. EXAM: LEFT RING FINGER 2+V COMPARISON:  None. FINDINGS: No fracture, dislocation or suspicious focal osseous lesion. Joint spaces appear normal. There are mild curvilinear soft tissue calcifications along the radial aspect of the fourth finger. IMPRESSION: 1. No fracture or malalignment in the left fourth finger. 2. Mild curvilinear soft tissue calcifications along the radial aspect of the left fourth finger, nonspecific, possibly due to gout or an inflammatory condition such as polymyositis/dermatomyositis. Electronically Signed   By: Ilona Sorrel M.D.   On: 06/24/2015 16:17    Not all labs, radiology exams or other studies done during hospitalization come through on my EPIC note; however they are reviewed by me.    Assessment and Plan  VT (ventricular tachycardia) (HCC) Found on AICD interrogation-patient was defibrillated. Patient is a poor historian-and has no recollection of any shocks that were delivered. EPS consulted-recommendations are to increase beta blocker. Per EPS-patient will follow-up with his primary cardiologist-Dr. [name] for further discussion  regarding hospice/turning off the AICD.   Acute delirium Suspect may have dementia at baseline-worsened by narcotics/benzodiazepines. Much improved, SNF - continue lorazepam and Depakote.   C1 cervical fracture (HCC) Secondary to fall due to above. Seen by neurosurgery, recommendations are to continue cervical collar and have outpatient neurosurgical follow-up. Continue supportive care with scheduled Tylenol,SNF - attempt to minimize narcotics as much as possible. .  Orthostatic hypotension Difficult situation-cardiology increased beta blocker-for V. tach. SNF -  TED hose and monitor.   Chronic systolic heart failure (HCC) SNF - Clinically compensated-continue Coreg and Lasix, fluid restriction 1.5 liters;. Suspect given orthostatic hypotension/soft BP-no room to add ACE inhibitor or moreDiuretics.  Atrial flutter Previously on anticoagulation with Xarelto-apparently discontinued prior to this admission. Clearly not a candidate for anticoagulation given fall risk/C1 fracture. SNF - Continue Coreg; ASA 81 mg daily  Coronary atherosclerosis Status post CABG and PCI SNF - continue aspirin, statin, Coreg   GERD (gastroesophageal reflux disease) SNF - stable; cont nexium,carafate and pepcid  DM type 2 causing vascular disease (HCC) SNF - A1c on no meds, on statin, BP too soft for ACE addition  Anemia of chronic disease SNF - chronic, looks like not using substrates as levels are acceptable; will follow CBC; pt is Hospice, outpt w/u prob not appropriate  Laceration Sutured in the emergency room, Forehead and finger; given tdap SNF - one week to remove sutures 10/30    Time spent 45 min;> 50% of time with patient was spent reviewing records, labs, tests and  studies, counseling and developing plan of care  Hennie Duos, MD

## 2015-06-29 NOTE — Assessment & Plan Note (Signed)
SNF - A1c on no meds, on statin, BP too soft for ACE addition

## 2015-06-29 NOTE — Assessment & Plan Note (Signed)
Suspect may have dementia at baseline-worsened by narcotics/benzodiazepines. Much improved, SNF - continue lorazepam and Depakote.

## 2015-06-29 NOTE — Assessment & Plan Note (Signed)
Sutured in the emergency room, Forehead and finger; given tdap SNF - one week to remove sutures 10/30

## 2015-06-29 NOTE — Assessment & Plan Note (Signed)
Previously on anticoagulation with Xarelto-apparently discontinued prior to this admission. Clearly not a candidate for anticoagulation given fall risk/C1 fracture. SNF - Continue Coreg; ASA 81 mg daily

## 2015-06-29 NOTE — Assessment & Plan Note (Signed)
Found on AICD interrogation-patient was defibrillated. Patient is a poor historian-and has no recollection of any shocks that were delivered. EPS consulted-recommendations are to increase beta blocker. Per EPS-patient will follow-up with his primary cardiologist-Dr. [name] for further discussion regarding hospice/turning off the AICD.

## 2015-07-01 ENCOUNTER — Telehealth: Payer: Self-pay | Admitting: Internal Medicine

## 2015-07-01 NOTE — Telephone Encounter (Signed)
Error

## 2015-07-07 ENCOUNTER — Encounter: Payer: Self-pay | Admitting: Internal Medicine

## 2015-07-16 ENCOUNTER — Other Ambulatory Visit: Payer: Self-pay | Admitting: *Deleted

## 2015-07-16 MED ORDER — LORAZEPAM 0.5 MG PO TABS: ORAL_TABLET | ORAL | Status: DC

## 2015-07-16 NOTE — Telephone Encounter (Signed)
[facility] 

## 2015-07-21 ENCOUNTER — Encounter: Payer: Self-pay | Admitting: *Deleted

## 2015-07-21 ENCOUNTER — Telehealth: Payer: Self-pay | Admitting: *Deleted

## 2015-07-21 NOTE — Telephone Encounter (Signed)
Spoke with POA concerning his family history. She stated she would call back later today.

## 2015-07-24 ENCOUNTER — Ambulatory Visit (INDEPENDENT_AMBULATORY_CARE_PROVIDER_SITE_OTHER): Payer: Medicare Other | Admitting: Internal Medicine

## 2015-07-24 ENCOUNTER — Encounter: Payer: Self-pay | Admitting: Internal Medicine

## 2015-07-24 ENCOUNTER — Other Ambulatory Visit: Payer: Self-pay

## 2015-07-24 VITALS — BP 120/68 | HR 60 | Ht 64.0 in | Wt 156.2 lb

## 2015-07-24 DIAGNOSIS — I255 Ischemic cardiomyopathy: Secondary | ICD-10-CM

## 2015-07-24 DIAGNOSIS — I10 Essential (primary) hypertension: Secondary | ICD-10-CM | POA: Diagnosis not present

## 2015-07-24 DIAGNOSIS — I472 Ventricular tachycardia, unspecified: Secondary | ICD-10-CM

## 2015-07-24 DIAGNOSIS — I5022 Chronic systolic (congestive) heart failure: Secondary | ICD-10-CM

## 2015-07-24 DIAGNOSIS — Z95810 Presence of automatic (implantable) cardiac defibrillator: Secondary | ICD-10-CM | POA: Diagnosis not present

## 2015-07-24 LAB — CUP PACEART INCLINIC DEVICE CHECK
Brady Statistic RA Percent Paced: 45 %
Date Time Interrogation Session: 20161118121911
Implantable Lead Implant Date: 20040709
Implantable Lead Implant Date: 20130206
Implantable Lead Location: 753858
Implantable Lead Location: 753859
Implantable Lead Location: 753860
Implantable Lead Location: 753860
Implantable Lead Model: 5071
Implantable Lead Model: 5076
Lead Channel Pacing Threshold Amplitude: 1.25 V
Lead Channel Pacing Threshold Pulse Width: 0.4 ms
Lead Channel Pacing Threshold Pulse Width: 0.4 ms
Lead Channel Pacing Threshold Pulse Width: 0.4 ms
Lead Channel Setting Pacing Amplitude: 2.5 V
Lead Channel Setting Pacing Pulse Width: 0.4 ms
MDC IDC LEAD IMPLANT DT: 20041111
MDC IDC LEAD IMPLANT DT: 20150520
MDC IDC MSMT LEADCHNL RA PACING THRESHOLD AMPLITUDE: 0.75 V
MDC IDC MSMT LEADCHNL RA SENSING INTR AMPL: 2.5 mV
MDC IDC MSMT LEADCHNL RV PACING THRESHOLD AMPLITUDE: 0.75 V
MDC IDC MSMT LEADCHNL RV SENSING INTR AMPL: 3.9 mV
MDC IDC SET LEADCHNL RA PACING AMPLITUDE: 1.5 V
MDC IDC SET LEADCHNL RV PACING AMPLITUDE: 2 V
MDC IDC SET LEADCHNL RV PACING PULSEWIDTH: 0.4 ms
MDC IDC SET LEADCHNL RV SENSING SENSITIVITY: 0.3 mV

## 2015-07-24 NOTE — Assessment & Plan Note (Signed)
His symptoms remain class 2. He will continue his current meds. His fluid index is down.

## 2015-07-24 NOTE — Assessment & Plan Note (Signed)
His blood pressure is well controlled. He will maintain his current meds and a low sodium diet.

## 2015-07-24 NOTE — Patient Instructions (Signed)
Medication Instructions:  Your physician recommends that you continue on your current medications as directed. Please refer to the Current Medication list given to you today.   Labwork: None ordered   Testing/Procedures: None ordered   Follow-Up: Your physician wants you to follow-up in: 12 months with Dr Taylor You will receive a reminder letter in the mail two months in advance. If you don't receive a letter, please call our office to schedule the follow-up appointment.  Remote monitoring is used to monitor your ICD from home. This monitoring reduces the number of office visits required to check your device to one time per year. It allows us to keep an eye on the functioning of your device to ensure it is working properly. You are scheduled for a device check from home on 10/26/15. You may send your transmission at any time that day. If you have a wireless device, the transmission will be sent automatically. After your physician reviews your transmission, you will receive a postcard with your next transmission date.     Any Other Special Instructions Will Be Listed Below (If Applicable).     If you need a refill on your cardiac medications before your next appointment, please call your pharmacy.   

## 2015-07-24 NOTE — Assessment & Plan Note (Signed)
His medtronic BiV ICD is working normally. Will recheck in several months.  

## 2015-07-24 NOTE — Assessment & Plan Note (Signed)
He had sustained VT several weeks ago with appropriate ICD therapies. As he has been made DNR, I discussed the possibility of turning off his Tachy therapies but he would like not to do so. As he has had just one episode of sustained VT, will hold off on AA drug therapy for now.

## 2015-07-24 NOTE — Progress Notes (Signed)
HPI [name] returns today for followup. He is a pleasant 81 yo man with a h/o chronic systolic heart failure, CAD, atrial flutter, PVC's s/p ICD insertion. He denies chest pain. He is still pacing over 85% of the time.  No syncope or palpitions. He received an ICD shock for VT back in October. He broke his neck trying to put on his shirt and is in a cervical collar. He is in rehab/SNF.  No Known Allergies   Current Outpatient Prescriptions  Medication Sig Dispense Refill  . acetaminophen (TYLENOL) 500 MG tablet Take 2 tablets (1,000 mg total) by mouth 3 (three) times daily.    Marland Kitchen allopurinol (ZYLOPRIM) 300 MG tablet Take 300 mg by mouth daily.      . carvedilol (COREG) 6.25 MG tablet Take 1 tablet (6.25 mg total) by mouth 2 (two) times daily.    . divalproex (DEPAKOTE SPRINKLE) 125 MG capsule Take 2 capsules (250 mg total) by mouth at bedtime. 20 capsule 0  . docusate sodium (COLACE) 100 MG capsule Take 100 mg by mouth 2 (two) times daily.    Marland Kitchen escitalopram (LEXAPRO) 10 MG tablet Take 1 tablet (10 mg total) by mouth daily.  0  . esomeprazole (NEXIUM) 20 MG capsule Take 20 mg by mouth daily at 12 noon.    . famotidine (PEPCID) 20 MG tablet Take 20 mg by mouth daily.     . ferrous sulfate 325 (65 FE) MG tablet Take 325 mg by mouth daily.    . furosemide (LASIX) 20 MG tablet Take 20 mg by mouth daily.    Marland Kitchen LORazepam (ATIVAN) 0.5 MG tablet Take one tablet by mouth twice daily for anxiety 60 tablet 0  . NITROSTAT 0.4 MG SL tablet Place 0.4 mg under the tongue every 5 (five) minutes as needed for chest pain (MAX 3 TABLETS).     Marland Kitchen oxyCODONE (OXY IR/ROXICODONE) 5 MG immediate release tablet Take 1 tablet (5 mg total) by mouth every 6 (six) hours as needed for moderate pain. 30 tablet 0  . OXYGEN Inhale 3 L into the lungs daily as needed (shortness of breath).    Marland Kitchen PATADAY 0.2 % SOLN Place 1 drop into both eyes daily.     Marland Kitchen RA ASPIRIN ADULT LOW STRENGTH 81 MG EC tablet Take 1 tablet by  mouth daily.  0  . sucralfate (CARAFATE) 1 G tablet Take 1 g by mouth 2 (two) times daily.     Marland Kitchen tiZANidine (ZANAFLEX) 2 MG tablet Take 1 tablet (2 mg total) by mouth every 8 (eight) hours as needed for muscle spasms. 30 tablet 0   No current facility-administered medications for this visit.     Past Medical History  Diagnosis Date  . CAD (coronary artery disease)     s/p CABG; s/p Pacemaker  . Systolic CHF with reduced left ventricular function, NYHA class 2 ([place])   . Ischemic cardiomyopathy     s/p ICD  . Hypertension   . Hyperlipidemia   . Gout   . Paroxysmal ventricular tachycardia ([place])   . Diverticulosis of colon   . AICD (automatic cardioverter/defibrillator) present   . Arthritis     "shoulders" (10/31/2014)  . On home oxygen therapy     "2L prn" (10/31/2014)  . Shortness of breath dyspnea     with exertion  . GERD (gastroesophageal reflux disease)   . SBO (small bowel obstruction) ([place]) 10/31/2014    ROS:   All systems reviewed  and negative except as noted in the HPI.   Past Surgical History  Procedure Laterality Date  . Coronary artery bypass graft  03/2003    CABG X3/notes 01/18/2011  . Bowel resection    . Coronary angioplasty with stent placement  04/2011    2 stents/notes 05/03/2011  . Implantable cardioverter defibrillator (icd) generator change Left 10/12/2011    Procedure: ICD GENERATOR CHANGE;  Surgeon: Evans Lance, MD;  Location: [facility] CATH LAB;  Service: Cardiovascular;  Laterality: Left;  . Pacemaker revision N/A 10/12/2011    Procedure: PACEMAKER REVISION;  Surgeon: Evans Lance, MD;  Location: [facility] CATH LAB;  Service: Cardiovascular;  Laterality: N/A;  . Bi-ventricular implantable cardioverter defibrillator upgrade N/A 01/22/2014    Procedure: BI-VENTRICULAR IMPLANTABLE CARDIOVERTER DEFIBRILLATOR UPGRADE;  Surgeon: Evans Lance, MD;  Location: [facility] CATH LAB;  Service: Cardiovascular;  Laterality: N/A;  . Tonsillectomy    . Cholecystectomy    . Cataract  extraction w/ intraocular lens  implant, bilateral Bilateral   . Tee with cardioversion  03/2003    Archie Endo 01/18/2011  . Cardiac defibrillator placement  07/2003    Archie Endo 01/18/2011  . Cardiac catheterization  03/2011    Archie Endo 01/18/2011  . Eye surgery Bilateral     [place]  . Cataract extraction w/phaco Right 12/17/2014    Procedure: PHACOEMULSIFICATION CATARACT EXTRACTION WITH IOL IMPLANT RIGHT EYE;  Surgeon: Marylynn Pearson, MD;  Location: [place];  Service: Ophthalmology;  Laterality: Right;  . Pacemaker placement       Family History  Problem Relation Age of Onset  . Heart Problems Mother   . Diabetes Mother   . Heart Problems Brother      Social History   Social History  . Marital Status: Divorced    Spouse Name: N/A  . Number of Children: N/A  . Years of Education: N/A   Occupational History  . Not on file.   Social History Main Topics  . Smoking status: Never Smoker   . Smokeless tobacco: Former Systems developer    Types: Chew     Comment: no chew in over 3 years  . Alcohol Use: Yes     Comment: "might take a drink during the holidays"  . Drug Use: No  . Sexual Activity: No   Other Topics Concern  . Not on file   Social History Narrative     BP 120/68 mmHg  Pulse 60  Ht 5\' 4"  (1.626 m)  Wt 156 lb 3.2 oz (70.852 kg)  BMI 26.80 kg/m2  Physical Exam:  Well appearing 81 yo man, NAD HEENT: Unremarkable Neck:  6 cm JVD, no thyromegally Back:  No CVA tenderness Lungs:  Clear with no wheezes HEART:  Regular rate rhythm, no murmurs, no rubs, no clicks Abd:  soft, positive bowel sounds, no organomegally, no rebound, no guarding Ext:  2 plus pulses, no edema, no cyanosis, no clubbing Skin:  No rashes no nodules Neuro:  CN II through XII intact, motor grossly intact   DEVICE  Normal device function.  See PaceArt for details.   Assess/Plan:

## 2015-08-06 ENCOUNTER — Non-Acute Institutional Stay (SKILLED_NURSING_FACILITY): Payer: Medicare Other | Admitting: Adult Health

## 2015-08-06 DIAGNOSIS — D638 Anemia in other chronic diseases classified elsewhere: Secondary | ICD-10-CM | POA: Diagnosis not present

## 2015-08-06 DIAGNOSIS — I472 Ventricular tachycardia, unspecified: Secondary | ICD-10-CM

## 2015-08-06 DIAGNOSIS — I5022 Chronic systolic (congestive) heart failure: Secondary | ICD-10-CM

## 2015-08-06 DIAGNOSIS — S12091D Other nondisplaced fracture of first cervical vertebra, subsequent encounter for fracture with routine healing: Secondary | ICD-10-CM

## 2015-08-10 DIAGNOSIS — S12000D Unspecified displaced fracture of first cervical vertebra, subsequent encounter for fracture with routine healing: Secondary | ICD-10-CM | POA: Diagnosis not present

## 2015-08-10 DIAGNOSIS — I5022 Chronic systolic (congestive) heart failure: Secondary | ICD-10-CM | POA: Diagnosis not present

## 2015-08-10 DIAGNOSIS — I110 Hypertensive heart disease with heart failure: Secondary | ICD-10-CM | POA: Diagnosis not present

## 2015-08-10 DIAGNOSIS — I2510 Atherosclerotic heart disease of native coronary artery without angina pectoris: Secondary | ICD-10-CM | POA: Diagnosis not present

## 2015-08-18 ENCOUNTER — Other Ambulatory Visit: Payer: Self-pay | Admitting: Neurosurgery

## 2015-08-18 DIAGNOSIS — S12031S Nondisplaced posterior arch fracture of first cervical vertebra, sequela: Secondary | ICD-10-CM

## 2015-08-26 ENCOUNTER — Emergency Department (HOSPITAL_COMMUNITY): Payer: Medicare Other

## 2015-08-26 ENCOUNTER — Emergency Department (HOSPITAL_COMMUNITY)
Admission: EM | Admit: 2015-08-26 | Discharge: 2015-08-27 | Disposition: A | Discharge status: Home / Self Care | Payer: Medicare Other | Attending: Emergency Medicine | Admitting: Emergency Medicine

## 2015-08-26 ENCOUNTER — Ambulatory Visit
Admission: RE | Admit: 2015-08-26 | Discharge: 2015-08-26 | Disposition: A | Discharge status: Home / Self Care | Payer: Medicare Other | Source: Ambulatory Visit | Attending: Neurosurgery | Admitting: Neurosurgery

## 2015-08-26 ENCOUNTER — Encounter (HOSPITAL_COMMUNITY): Payer: Self-pay

## 2015-08-26 ENCOUNTER — Encounter (HOSPITAL_COMMUNITY): Payer: Self-pay | Admitting: *Deleted

## 2015-08-26 ENCOUNTER — Emergency Department (INDEPENDENT_AMBULATORY_CARE_PROVIDER_SITE_OTHER)
Admission: EM | Admit: 2015-08-26 | Discharge: 2015-08-26 | Disposition: A | Discharge status: Nursing Home (Includes ICF) | Payer: Medicare Other | Source: Home / Self Care | Attending: Emergency Medicine | Admitting: Emergency Medicine

## 2015-08-26 DIAGNOSIS — I10 Essential (primary) hypertension: Secondary | ICD-10-CM | POA: Diagnosis not present

## 2015-08-26 DIAGNOSIS — E785 Hyperlipidemia, unspecified: Secondary | ICD-10-CM | POA: Diagnosis not present

## 2015-08-26 DIAGNOSIS — I2510 Atherosclerotic heart disease of native coronary artery without angina pectoris: Secondary | ICD-10-CM | POA: Insufficient documentation

## 2015-08-26 DIAGNOSIS — K219 Gastro-esophageal reflux disease without esophagitis: Secondary | ICD-10-CM | POA: Insufficient documentation

## 2015-08-26 DIAGNOSIS — Z951 Presence of aortocoronary bypass graft: Secondary | ICD-10-CM | POA: Diagnosis not present

## 2015-08-26 DIAGNOSIS — W01198A Fall on same level from slipping, tripping and stumbling with subsequent striking against other object, initial encounter: Secondary | ICD-10-CM | POA: Diagnosis not present

## 2015-08-26 DIAGNOSIS — M1990 Unspecified osteoarthritis, unspecified site: Secondary | ICD-10-CM | POA: Diagnosis not present

## 2015-08-26 DIAGNOSIS — S12031S Nondisplaced posterior arch fracture of first cervical vertebra, sequela: Secondary | ICD-10-CM

## 2015-08-26 DIAGNOSIS — S01511A Laceration without foreign body of lip, initial encounter: Secondary | ICD-10-CM

## 2015-08-26 DIAGNOSIS — Y9289 Other specified places as the place of occurrence of the external cause: Secondary | ICD-10-CM | POA: Diagnosis not present

## 2015-08-26 DIAGNOSIS — Z79899 Other long term (current) drug therapy: Secondary | ICD-10-CM | POA: Diagnosis not present

## 2015-08-26 DIAGNOSIS — S0083XA Contusion of other part of head, initial encounter: Secondary | ICD-10-CM

## 2015-08-26 DIAGNOSIS — Y9389 Activity, other specified: Secondary | ICD-10-CM | POA: Insufficient documentation

## 2015-08-26 DIAGNOSIS — I5020 Unspecified systolic (congestive) heart failure: Secondary | ICD-10-CM | POA: Diagnosis not present

## 2015-08-26 DIAGNOSIS — S0012XA Contusion of left eyelid and periocular area, initial encounter: Secondary | ICD-10-CM

## 2015-08-26 DIAGNOSIS — W19XXXA Unspecified fall, initial encounter: Secondary | ICD-10-CM | POA: Diagnosis not present

## 2015-08-26 DIAGNOSIS — S0592XA Unspecified injury of left eye and orbit, initial encounter: Secondary | ICD-10-CM | POA: Diagnosis not present

## 2015-08-26 DIAGNOSIS — Y998 Other external cause status: Secondary | ICD-10-CM | POA: Insufficient documentation

## 2015-08-26 LAB — BASIC METABOLIC PANEL
ANION GAP: 11 (ref 5–15)
BUN: 12 mg/dL (ref 6–20)
CALCIUM: 9.4 mg/dL (ref 8.9–10.3)
CHLORIDE: 108 mmol/L (ref 101–111)
CO2: 23 mmol/L (ref 22–32)
Creatinine, Ser: 1.44 mg/dL — ABNORMAL HIGH (ref 0.61–1.24)
GFR calc Af Amer: 49 mL/min — ABNORMAL LOW (ref >60)
GFR calc non Af Amer: 42 mL/min — ABNORMAL LOW (ref >60)
Glucose, Bld: 122 mg/dL — ABNORMAL HIGH (ref 65–99)
Potassium: 4.7 mmol/L (ref 3.5–5.1)
Sodium: 142 mmol/L (ref 135–145)

## 2015-08-26 LAB — URINALYSIS, ROUTINE W REFLEX MICROSCOPIC
Bilirubin Urine: NEGATIVE
Glucose, UA: NEGATIVE mg/dL
Hgb urine dipstick: NEGATIVE
KETONES UR: NEGATIVE mg/dL
LEUKOCYTES UA: NEGATIVE
NITRITE: NEGATIVE
Protein, ur: NEGATIVE mg/dL
SPECIFIC GRAVITY, URINE: 1.013 (ref 1.005–1.030)
pH: 5 (ref 5.0–8.0)

## 2015-08-26 MED ORDER — LIDOCAINE HCL 2 % IJ SOLN
5.0000 mL | Freq: Once | INTRAMUSCULAR | Status: AC
  Administered 2015-08-26: 100 mg via INTRADERMAL
  Filled 2015-08-26: qty 20

## 2015-08-26 MED ORDER — LIDOCAINE-EPINEPHRINE-TETRACAINE (LET) SOLUTION
3.0000 mL | Freq: Once | NASAL | Status: AC
  Administered 2015-08-26: 3 mL via TOPICAL
  Filled 2015-08-26: qty 3

## 2015-08-26 NOTE — ED Provider Notes (Signed)
CSN: [id number]     Arrival date & time 08/26/15  1942 History   First MD Initiated Contact with Patient 08/26/15 2047     Chief Complaint  Patient presents with  . Eye Injury  . Lip injury      (Consider location/radiation/quality/duration/timing/severity/associated sxs/prior Treatment) HPI Comments: Patient with a history of CAD, CHF, ischemic cardiomyopathy s/p ICD, HTN, HLD, presents after fall that occurred around 2:30 this afternoon while at home. He is unsure why he fell but denies pre-evant dizziness, chest pain, SOB, recent illness, vomiting/diarrhea or syncope. He feels he might have fallen while walking and looking up at the clock but is unsure. He complains of left facial pain and bruising and lip laceration. No post-fall headache, syncope, chest pain, abdominal pain, nausea/vomiting, neck pain or extremity injury.   Patient is a 81 y.o. male presenting with eye injury. The history is provided by the patient and a caregiver. No language interpreter was used.  Eye Injury Pertinent negatives include no abdominal pain, chest pain, coughing, fatigue, fever, headaches, nausea, neck pain, vomiting or weakness.    Past Medical History  Diagnosis Date  . CAD (coronary artery disease)     s/p CABG; s/p Pacemaker  . Systolic CHF with reduced left ventricular function, NYHA class 2 ([place])   . Ischemic cardiomyopathy     s/p ICD  . Hypertension   . Hyperlipidemia   . Gout   . Paroxysmal ventricular tachycardia ([place])   . Diverticulosis of colon   . AICD (automatic cardioverter/defibrillator) present   . Arthritis     "shoulders" (10/31/2014)  . On home oxygen therapy     "2L prn" (10/31/2014)  . Shortness of breath dyspnea     with exertion  . GERD (gastroesophageal reflux disease)   . SBO (small bowel obstruction) ([place]) 10/31/2014   Past Surgical History  Procedure Laterality Date  . Coronary artery bypass graft  03/2003    CABG X3/notes 01/18/2011  . Bowel resection    .  Coronary angioplasty with stent placement  04/2011    2 stents/notes 05/03/2011  . Implantable cardioverter defibrillator (icd) generator change Left 10/12/2011    Procedure: ICD GENERATOR CHANGE;  Surgeon: Evans Lance, MD;  Location: [facility] CATH LAB;  Service: Cardiovascular;  Laterality: Left;  . Pacemaker revision N/A 10/12/2011    Procedure: PACEMAKER REVISION;  Surgeon: Evans Lance, MD;  Location: [facility] CATH LAB;  Service: Cardiovascular;  Laterality: N/A;  . Bi-ventricular implantable cardioverter defibrillator upgrade N/A 01/22/2014    Procedure: BI-VENTRICULAR IMPLANTABLE CARDIOVERTER DEFIBRILLATOR UPGRADE;  Surgeon: Evans Lance, MD;  Location: [facility] CATH LAB;  Service: Cardiovascular;  Laterality: N/A;  . Tonsillectomy    . Cholecystectomy    . Cataract extraction w/ intraocular lens  implant, bilateral Bilateral   . Tee with cardioversion  03/2003    Archie Endo 01/18/2011  . Cardiac defibrillator placement  07/2003    Archie Endo 01/18/2011  . Cardiac catheterization  03/2011    Archie Endo 01/18/2011  . Eye surgery Bilateral     [place]  . Cataract extraction w/phaco Right 12/17/2014    Procedure: PHACOEMULSIFICATION CATARACT EXTRACTION WITH IOL IMPLANT RIGHT EYE;  Surgeon: Marylynn Pearson, MD;  Location: [place];  Service: Ophthalmology;  Laterality: Right;  . Pacemaker placement     Family History  Problem Relation Age of Onset  . Heart Problems Mother   . Diabetes Mother   . Heart Problems Brother    Social History  Substance Use Topics  .  Smoking status: Never Smoker   . Smokeless tobacco: Former Systems developer    Types: Chew     Comment: no chew in over 3 years  . Alcohol Use: Yes     Comment: "might take a drink during the holidays"    Review of Systems  Constitutional: Negative for fever and fatigue.  HENT: Negative.   Eyes: Negative.  Negative for visual disturbance.  Respiratory: Negative.  Negative for cough and shortness of breath.   Cardiovascular: Negative.  Negative for chest pain.   Gastrointestinal: Negative.  Negative for nausea, vomiting and abdominal pain.  Genitourinary: Negative.   Musculoskeletal: Negative.  Negative for back pain and neck pain.  Skin: Positive for wound.  Neurological: Negative for syncope, weakness and headaches.      Allergies  Review of patient's allergies indicates no known allergies.  Home Medications   Prior to Admission medications   Medication Sig Start Date End Date Taking? Authorizing Provider  acetaminophen (TYLENOL) 500 MG tablet Take 2 tablets (1,000 mg total) by mouth 3 (three) times daily. 06/28/15  Yes Shanker Kristeen Mans, MD  allopurinol (ZYLOPRIM) 300 MG tablet Take 300 mg by mouth daily.     Yes Historical Provider, MD  carvedilol (COREG) 6.25 MG tablet Take 1 tablet (6.25 mg total) by mouth 2 (two) times daily. 06/28/15  Yes Shanker Kristeen Mans, MD  divalproex (DEPAKOTE SPRINKLE) 125 MG capsule Take 2 capsules (250 mg total) by mouth at bedtime. 06/28/15  Yes Shanker Kristeen Mans, MD  docusate sodium (COLACE) 100 MG capsule Take 100 mg by mouth 2 (two) times daily.   Yes Historical Provider, MD  escitalopram (LEXAPRO) 10 MG tablet Take 1 tablet (10 mg total) by mouth daily. 06/28/15  Yes Shanker Kristeen Mans, MD  esomeprazole (NEXIUM) 20 MG capsule Take 20 mg by mouth daily at 12 noon.   Yes Historical Provider, MD  ferrous sulfate 325 (65 FE) MG tablet Take 325 mg by mouth daily.   Yes Historical Provider, MD  furosemide (LASIX) 20 MG tablet Take 20 mg by mouth daily.   Yes Historical Provider, MD  LORazepam (ATIVAN) 0.5 MG tablet Take one tablet by mouth twice daily for anxiety 07/16/15  Yes Lauree Chandler, NP  NITROSTAT 0.4 MG SL tablet Place 0.4 mg under the tongue every 5 (five) minutes as needed for chest pain (MAX 3 TABLETS).  12/05/12  Yes Historical Provider, MD  oxyCODONE (OXY IR/ROXICODONE) 5 MG immediate release tablet Take 1 tablet (5 mg total) by mouth every 6 (six) hours as needed for moderate pain. 06/28/15  Yes  Shanker Kristeen Mans, MD  PATADAY 0.2 % SOLN Place 1 drop into both eyes daily.  01/14/14  Yes Historical Provider, MD  RA ASPIRIN ADULT LOW STRENGTH 81 MG EC tablet Take 1 tablet by mouth daily. 09/01/14  Yes Historical Provider, MD  sucralfate (CARAFATE) 1 G tablet Take 1 g by mouth 2 (two) times daily.    Yes Historical Provider, MD  tiZANidine (ZANAFLEX) 2 MG tablet Take 1 tablet (2 mg total) by mouth every 8 (eight) hours as needed for muscle spasms. 06/18/15  Yes Gareth Morgan, MD  famotidine (PEPCID) 20 MG tablet Take 20 mg by mouth daily.     Historical Provider, MD  OXYGEN Inhale 3 L into the lungs daily as needed (shortness of breath).    Historical Provider, MD   BP 129/77 mmHg  Pulse 59  Temp(Src) 97.3 F (36.3 C) (Oral)  Resp 18  Ht 5'  4" (1.626 m)  Wt 70.761 kg  BMI 26.76 kg/m2  SpO2 100% Physical Exam  Constitutional: He is oriented to person, place, and time. He appears well-developed and well-nourished. No distress.  HENT:  Head: Normocephalic.  Left facial bruising without significant swelling surround left eye and cheek. Minimal facial bone tenderness. No deformities.  Eyes: Conjunctivae are normal.  Full, pain-free range of extraocular motion. Bilateral arcus senilis   Neck: Normal range of motion. Neck supple.  Pulmonary/Chest: Effort normal. He has no wheezes. He has no rales. He exhibits no tenderness.  Abdominal: There is no tenderness.  Neurological: He is alert and oriented to person, place, and time. Coordination normal.  Awake, alert, oriented. GCS 15. Speech clear and focused. Coordination without deficits. CN's 3-12 grossly intact.   Skin: Skin is warm and dry.  Left upper lip laceration.   Psychiatric: He has a normal mood and affect.    ED Course  Procedures (including critical care time) Labs Review Labs Reviewed  URINE CULTURE  BASIC METABOLIC PANEL  URINALYSIS, ROUTINE W REFLEX MICROSCOPIC (NOT AT [facility])    Imaging Review Ct Cervical Spine  Wo Contrast  08/26/2015  CLINICAL DATA:  Cervical spine fracture. EXAM: CT CERVICAL SPINE WITHOUT CONTRAST TECHNIQUE: Multidetector CT imaging of the cervical spine was performed without intravenous contrast. Multiplanar CT image reconstructions were also generated. COMPARISON:  CT of the cervical spine 06/24/2015. FINDINGS: The C1 burst fracture is again seen. There is slight increased separation of the posterior arch compared to the prior study. Separation at the anterior arch is stable. Marked soft tissue hyperdensity surrounding the dens likely represents pannus associated with inflammatory arthropathy. Hemorrhage is considered less likely at this point. In this does appear to have some mass effect on the spinal cord. There healing about the laminar fractures bilaterally at C2. A nondisplaced right transverse sinus fracture at C7 is healing. No new fractures are present. Slight anterolisthesis at C7-T1 is stable, likely degenerative. There is fusion across the disc space at C5-6 and C6-7. There is fusion across the disc space at C3-4. Osseous foraminal narrowing is again noted bilaterally at C3-4, worse on the left. Alignment is otherwise stable. Soft tissues the neck are unremarkable. The patient is status post median sternotomy. The lung apices are clear. A nondisplaced healing right first rib fracture is present. There is no pneumothorax. IMPRESSION: 1. Similar appearance of C1 burst fracture with slight increased separation of the posterior arch. The anterior arch is stable. 2. Healing fractures along the lateral aspect of the CT lamina, right greater than left. 3. No other acute or healing fracture. 4. Multilevel spondylosis of the cervical spine is stable. 5. Nondisplaced right first rib and transverse process right C7 fractures are healing. Electronically Signed   By: San Morelle M.D.   On: 08/26/2015 17:29   I have personally reviewed and evaluated these images and lab results as part of my  medical decision-making.   EKG Interpretation None     LACERATION REPAIR Performed by: Charlann Lange A Authorized by: Charlann Lange A Consent: Verbal consent obtained. Risks and benefits: risks, benefits and alternatives were discussed Consent given by: patient Patient identity confirmed: provided demographic data Prepped and Draped in normal sterile fashion Wound explored  Laceration Location: upper left lip  Laceration Length: 1.5 cm  No Foreign Bodies seen or palpated  Anesthesia: local infiltration  Local anesthetic: lidocaine 1% w/o epinephrine, L.E.T.  Anesthetic total: less than 1 ml  Irrigation method: syringe Amount of  cleaning: standard  Skin closure: 6-0 vicryl  Number of sutures: 3  Technique: simple interrupted  Patient tolerance: Patient tolerated the procedure well with no immediate complications.  MDM   Final diagnoses:  None    1. Fall 2. Facial contusion 3. Lip laceration  CT's of head and face negative for acute abnormality. CT neck done earlier today and reviewed, found to be without acute change. Laceration repaired.  The patient has remained alert, oriented. He ambulates in the department well, without dizziness or imbalance. He is felt stable for discharge home.    Charlann Lange, PA-C 08/27/15 0008  Lacretia Leigh, MD 08/27/15 [phone]

## 2015-08-26 NOTE — ED Notes (Signed)
Patient transported to CT 

## 2015-08-26 NOTE — ED Provider Notes (Signed)
HPI  SUBJECTIVE:  [name] is a 81 y.o. male who presents with periorbital bruising, swelling, left upper lip laceration status post fall earlier today. Patient is unsure as to the etiology of the fall, he states that he did not trip and fall, but just fell. He denies weakness, chest pain, shortness of breath, palpitations, nausea, diaphoresis preceding the fall. He states that he hit his left eye and left lip on the walker. He had a C-spine CT later in the afternoon which was scheduled previously to evaluate the C1 burst fracture sustained from a previous fall. He denies nausea, vomiting, loss of consciousness, and arm or leg weakness, numbness or tingling in his extremities, dysarthria, discrimination, visual changes, pain with Extraocular movements, photophobia. He states he did not feel his defibrillator fire. He states that his neck "feels different" than before the fall but states that it does not hurt. Past medical history of coronary artery disease, congestive heart failure, DVT status post defibrillator/AICD placement, hypertension, stroke" in his left eye". He is on aspirin. He denies any other anticoagulant or antiplatelet use. No history of MI. Fall occurred about 1400 today.   Past Medical History  Diagnosis Date  . CAD (coronary artery disease)     s/p CABG; s/p Pacemaker  . Systolic CHF with reduced left ventricular function, NYHA class 2 ([place])   . Ischemic cardiomyopathy     s/p ICD  . Hypertension   . Hyperlipidemia   . Gout   . Paroxysmal ventricular tachycardia ([place])   . Diverticulosis of colon   . AICD (automatic cardioverter/defibrillator) present   . Arthritis     "shoulders" (10/31/2014)  . On home oxygen therapy     "2L prn" (10/31/2014)  . Shortness of breath dyspnea     with exertion  . GERD (gastroesophageal reflux disease)   . SBO (small bowel obstruction) ([place]) 10/31/2014    Past Surgical History  Procedure Laterality Date  . Coronary artery bypass  graft  03/2003    CABG X3/notes 01/18/2011  . Bowel resection    . Coronary angioplasty with stent placement  04/2011    2 stents/notes 05/03/2011  . Implantable cardioverter defibrillator (icd) generator change Left 10/12/2011    Procedure: ICD GENERATOR CHANGE;  Surgeon: Evans Lance, MD;  Location: [facility] CATH LAB;  Service: Cardiovascular;  Laterality: Left;  . Pacemaker revision N/A 10/12/2011    Procedure: PACEMAKER REVISION;  Surgeon: Evans Lance, MD;  Location: [facility] CATH LAB;  Service: Cardiovascular;  Laterality: N/A;  . Bi-ventricular implantable cardioverter defibrillator upgrade N/A 01/22/2014    Procedure: BI-VENTRICULAR IMPLANTABLE CARDIOVERTER DEFIBRILLATOR UPGRADE;  Surgeon: Evans Lance, MD;  Location: [facility] CATH LAB;  Service: Cardiovascular;  Laterality: N/A;  . Tonsillectomy    . Cholecystectomy    . Cataract extraction w/ intraocular lens  implant, bilateral Bilateral   . Tee with cardioversion  03/2003    Archie Endo 01/18/2011  . Cardiac defibrillator placement  07/2003    Archie Endo 01/18/2011  . Cardiac catheterization  03/2011    Archie Endo 01/18/2011  . Eye surgery Bilateral     [place]  . Cataract extraction w/phaco Right 12/17/2014    Procedure: PHACOEMULSIFICATION CATARACT EXTRACTION WITH IOL IMPLANT RIGHT EYE;  Surgeon: Marylynn Pearson, MD;  Location: [place];  Service: Ophthalmology;  Laterality: Right;  . Pacemaker placement      Family History  Problem Relation Age of Onset  . Heart Problems Mother   . Diabetes Mother   . Heart  Problems Brother     Social History  Substance Use Topics  . Smoking status: Never Smoker   . Smokeless tobacco: Former Systems developer    Types: Chew     Comment: no chew in over 3 years  . Alcohol Use: Yes     Comment: "might take a drink during the holidays"    No current facility-administered medications for this encounter.  Current outpatient prescriptions:  .  acetaminophen (TYLENOL) 500 MG tablet, Take 2 tablets (1,000 mg total) by mouth 3 (three)  times daily., Disp: , Rfl:  .  allopurinol (ZYLOPRIM) 300 MG tablet, Take 300 mg by mouth daily.  , Disp: , Rfl:  .  carvedilol (COREG) 6.25 MG tablet, Take 1 tablet (6.25 mg total) by mouth 2 (two) times daily., Disp: , Rfl:  .  divalproex (DEPAKOTE SPRINKLE) 125 MG capsule, Take 2 capsules (250 mg total) by mouth at bedtime., Disp: 20 capsule, Rfl: 0 .  docusate sodium (COLACE) 100 MG capsule, Take 100 mg by mouth 2 (two) times daily., Disp: , Rfl:  .  escitalopram (LEXAPRO) 10 MG tablet, Take 1 tablet (10 mg total) by mouth daily., Disp: , Rfl: 0 .  esomeprazole (NEXIUM) 20 MG capsule, Take 20 mg by mouth daily at 12 noon., Disp: , Rfl:  .  famotidine (PEPCID) 20 MG tablet, Take 20 mg by mouth daily. , Disp: , Rfl:  .  ferrous sulfate 325 (65 FE) MG tablet, Take 325 mg by mouth daily., Disp: , Rfl:  .  furosemide (LASIX) 20 MG tablet, Take 20 mg by mouth daily., Disp: , Rfl:  .  LORazepam (ATIVAN) 0.5 MG tablet, Take one tablet by mouth twice daily for anxiety, Disp: 60 tablet, Rfl: 0 .  NITROSTAT 0.4 MG SL tablet, Place 0.4 mg under the tongue every 5 (five) minutes as needed for chest pain (MAX 3 TABLETS). , Disp: , Rfl:  .  oxyCODONE (OXY IR/ROXICODONE) 5 MG immediate release tablet, Take 1 tablet (5 mg total) by mouth every 6 (six) hours as needed for moderate pain., Disp: 30 tablet, Rfl: 0 .  OXYGEN, Inhale 3 L into the lungs daily as needed (shortness of breath)., Disp: , Rfl:  .  PATADAY 0.2 % SOLN, Place 1 drop into both eyes daily. , Disp: , Rfl:  .  RA ASPIRIN ADULT LOW STRENGTH 81 MG EC tablet, Take 1 tablet by mouth daily., Disp: , Rfl: 0 .  sucralfate (CARAFATE) 1 G tablet, Take 1 g by mouth 2 (two) times daily. , Disp: , Rfl:  .  tiZANidine (ZANAFLEX) 2 MG tablet, Take 1 tablet (2 mg total) by mouth every 8 (eight) hours as needed for muscle spasms., Disp: 30 tablet, Rfl: 0  No Known Allergies   ROS  As noted in HPI.   Physical Exam  BP 105/73 mmHg  Pulse 88  Temp(Src)  98.5 F (36.9 C) (Oral)  Resp 17  SpO2 99%  Constitutional: Well developed, well nourished, no acute distress Eyes: PERRL, EOMI, conjunctiva normal bilaterally HENT: Normocephalic, left periorbital edema, bruising. No periorbital tenderness. No tenderness over the nasal bridge. No tenderness over the jaw. 2 cm left upper lip laceration that crosses the vermilion border. No evidence of gingival or mandibular/maxillary injury.,mucus membranes moist Respiratory: Clear to auscultation bilaterally, no rales, no wheezing, no rhonchi Cardiovascular: Normal rate and rhythm, no murmurs, no gallops, no rubs.  radial pulses 2+ equal bilaterally GI: Soft, nondistended, normal bowel sounds, nontender, no rebound, no guarding Neck:  No C-spine tenderness skin: No rash, skin intact Musculoskeletal: No edema, no tenderness, no deformities Neurologic: Alert & oriented x 3, CN II-XII  intact, no motor deficits, sensation grossly intact strength upper and lower extremities intact grip strength equal 5/5 bilaterally, Psychiatric: Speech and behavior appropriate   ED Course   Medications - No data to display  Orders Placed This Encounter  Procedures  . ED EKG    Fall    Standing Status: Standing     Number of Occurrences: 1     Standing Expiration Date:     Order Specific Question:  Reason for Exam    Answer:  Other (See Comments)  . EKG 12-Lead    Standing Status: Standing     Number of Occurrences: 1     Standing Expiration Date:    No results found for this or any previous visit (from the past 24 hour(s)). Ct Cervical Spine Wo Contrast  08/26/2015  CLINICAL DATA:  Cervical spine fracture. EXAM: CT CERVICAL SPINE WITHOUT CONTRAST TECHNIQUE: Multidetector CT imaging of the cervical spine was performed without intravenous contrast. Multiplanar CT image reconstructions were also generated. COMPARISON:  CT of the cervical spine 06/24/2015. FINDINGS: The C1 burst fracture is again seen. There is  slight increased separation of the posterior arch compared to the prior study. Separation at the anterior arch is stable. Marked soft tissue hyperdensity surrounding the dens likely represents pannus associated with inflammatory arthropathy. Hemorrhage is considered less likely at this point. In this does appear to have some mass effect on the spinal cord. There healing about the laminar fractures bilaterally at C2. A nondisplaced right transverse sinus fracture at C7 is healing. No new fractures are present. Slight anterolisthesis at C7-T1 is stable, likely degenerative. There is fusion across the disc space at C5-6 and C6-7. There is fusion across the disc space at C3-4. Osseous foraminal narrowing is again noted bilaterally at C3-4, worse on the left. Alignment is otherwise stable. Soft tissues the neck are unremarkable. The patient is status post median sternotomy. The lung apices are clear. A nondisplaced healing right first rib fracture is present. There is no pneumothorax. IMPRESSION: 1. Similar appearance of C1 burst fracture with slight increased separation of the posterior arch. The anterior arch is stable. 2. Healing fractures along the lateral aspect of the CT lamina, right greater than left. 3. No other acute or healing fracture. 4. Multilevel spondylosis of the cervical spine is stable. 5. Nondisplaced right first rib and transverse process right C7 fractures are healing. Electronically Signed   By: San Morelle M.D.   On: 08/26/2015 17:29    ED Clinical Impression  Fall, initial encounter  Lip laceration, initial encounter  Periorbital ecchymosis of left eye, initial encounter   ED Assessment/Plan Patient did receive a CT of the C-spine today, which shows similar appearance C1 burst fracture with slight increased separation the posterior arch, the anterior arch stable, no other acute changes. He did not have a head CT. Unsure as to etiology of the fall. Could be a cardiac event,  so will get EKG to rule out any acute changes. Given the patient has periorbital bruising, We'll transfer down to the ED for further workup and also to have the lip repaired . Patient is neurologically intact, vitals are acceptable.   EKG: Ventricularly paced, rate 68. Left axis deviation. No ST-T wave changes compared to previous EKG from 06/25/2015  Feel the patient is stable to go by shuttle. Placing in C collar.  Discussed rationale for transfer with patient and family member. They agree with plan.  *This clinic note was created using Dragon dictation software. Therefore, there may be occasional mistakes despite careful proofreading.  ?   Melynda Ripple, MD 08/26/15 2134

## 2015-08-26 NOTE — ED Notes (Signed)
Patient states her fell earlier today hitting his head and lip Patient has a bruise about his left eye and a busted lip to the top left side Of his upper lip Patient takes 81mg  aspirin daily

## 2015-08-26 NOTE — ED Notes (Signed)
Patient being transferred to the ED for further evaluation Spoke with Lilia Pro - charge nurse

## 2015-08-26 NOTE — Progress Notes (Signed)
Patient ID: [name], male   DOB: 1927-04-29, 81 y.o.   MRN: [id number]    Facility:  [facility]      No Known Allergies  Chief Complaint  Patient presents with  . Discharge Note    HPI:  He is being discharged to home with home health for pt/ot. He will need his prescriptions to be written. He not need any dme and he will need to follow up with his pcp. He is not voicing any concerns at this time.   Past Medical History  Diagnosis Date  . CAD (coronary artery disease)     s/p CABG; s/p Pacemaker  . Systolic CHF with reduced left ventricular function, NYHA class 2 ([place])   . Ischemic cardiomyopathy     s/p ICD  . Hypertension   . Hyperlipidemia   . Gout   . Paroxysmal ventricular tachycardia ([place])   . Diverticulosis of colon   . AICD (automatic cardioverter/defibrillator) present   . Arthritis     "shoulders" (10/31/2014)  . On home oxygen therapy     "2L prn" (10/31/2014)  . Shortness of breath dyspnea     with exertion  . GERD (gastroesophageal reflux disease)   . SBO (small bowel obstruction) ([place]) 10/31/2014    Past Surgical History  Procedure Laterality Date  . Coronary artery bypass graft  03/2003    CABG X3/notes 01/18/2011  . Bowel resection    . Coronary angioplasty with stent placement  04/2011    2 stents/notes 05/03/2011  . Implantable cardioverter defibrillator (icd) generator change Left 10/12/2011    Procedure: ICD GENERATOR CHANGE;  Surgeon: Evans Lance, MD;  Location: [facility] CATH LAB;  Service: Cardiovascular;  Laterality: Left;  . Pacemaker revision N/A 10/12/2011    Procedure: PACEMAKER REVISION;  Surgeon: Evans Lance, MD;  Location: [facility] CATH LAB;  Service: Cardiovascular;  Laterality: N/A;  . Bi-ventricular implantable cardioverter defibrillator upgrade N/A 01/22/2014    Procedure: BI-VENTRICULAR IMPLANTABLE CARDIOVERTER DEFIBRILLATOR UPGRADE;  Surgeon: Evans Lance, MD;  Location: [facility] CATH LAB;  Service: Cardiovascular;  Laterality: N/A;  .  Tonsillectomy    . Cholecystectomy    . Cataract extraction w/ intraocular lens  implant, bilateral Bilateral   . Tee with cardioversion  03/2003    Archie Endo 01/18/2011  . Cardiac defibrillator placement  07/2003    Archie Endo 01/18/2011  . Cardiac catheterization  03/2011    Archie Endo 01/18/2011  . Eye surgery Bilateral     [place]  . Cataract extraction w/phaco Right 12/17/2014    Procedure: PHACOEMULSIFICATION CATARACT EXTRACTION WITH IOL IMPLANT RIGHT EYE;  Surgeon: Marylynn Pearson, MD;  Location: [place];  Service: Ophthalmology;  Laterality: Right;  . Pacemaker placement      VITAL SIGNS BP 132/80 mmHg  Pulse 70  Wt 150 lb (68.04 kg)  Patient's Medications  New Prescriptions   No medications on file  Previous Medications   ACETAMINOPHEN (TYLENOL) 500 MG TABLET    Take 2 tablets (1,000 mg total) by mouth 3 (three) times daily.   ALLOPURINOL (ZYLOPRIM) 300 MG TABLET    Take 300 mg by mouth daily.     CARVEDILOL (COREG) 6.25 MG TABLET    Take 1 tablet (6.25 mg total) by mouth 2 (two) times daily.   DIVALPROEX (DEPAKOTE SPRINKLE) 125 MG CAPSULE    Take 2 capsules (250 mg total) by mouth at bedtime.   DOCUSATE SODIUM (COLACE) 100 MG CAPSULE    Take 100 mg by mouth 2 (two)  times daily.   ESCITALOPRAM (LEXAPRO) 10 MG TABLET    Take 1 tablet (10 mg total) by mouth daily.   ESOMEPRAZOLE (NEXIUM) 20 MG CAPSULE    Take 20 mg by mouth daily at 12 noon.   FAMOTIDINE (PEPCID) 20 MG TABLET    Take 20 mg by mouth daily.    FERROUS SULFATE 325 (65 FE) MG TABLET    Take 325 mg by mouth daily.   FUROSEMIDE (LASIX) 20 MG TABLET    Take 20 mg by mouth daily.   LORAZEPAM (ATIVAN) 0.5 MG TABLET    Take one tablet by mouth twice daily for anxiety   NITROSTAT 0.4 MG SL TABLET    Place 0.4 mg under the tongue every 5 (five) minutes as needed for chest pain (MAX 3 TABLETS).    OXYCODONE (OXY IR/ROXICODONE) 5 MG IMMEDIATE RELEASE TABLET    Take 1 tablet (5 mg total) by mouth every 6 (six) hours as needed for moderate  pain.   OXYGEN    Inhale 3 L into the lungs daily as needed (shortness of breath).   PATADAY 0.2 % SOLN    Place 1 drop into both eyes daily.    RA ASPIRIN ADULT LOW STRENGTH 81 MG EC TABLET    Take 1 tablet by mouth daily.   SUCRALFATE (CARAFATE) 1 G TABLET    Take 1 g by mouth 2 (two) times daily.    TIZANIDINE (ZANAFLEX) 2 MG TABLET    Take 1 tablet (2 mg total) by mouth every 8 (eight) hours as needed for muscle spasms.  Modified Medications   No medications on file  Discontinued Medications   No medications on file     SIGNIFICANT DIAGNOSTIC EXAMS    ROS:  GENERAL:  no fevers, fatigue, appetite changes SKIN: No itching, rash or wounds RESPIRATORY: No cough, wheezing, SOB CARDIAC: No chest pain, palpitations, lower extremity edema  GI: No abdominal pain, No N/V/D or constipation, No heartburn or reflux  MUSCULOSKELETAL: No unrelieved bone/joint pain NEUROLOGIC: No headache, dizziness or focal weakness PSYCHIATRIC: No c/o anxiety or sadness     Physical Exam  GENERAL APPEARANCE: Alert, conversant, No acute distress.  SKIN: No diaphoresis rash HEAD: Normocephalic, atraumatic  RESPIRATORY: Breathing is even, unlabored. Lung sounds are clear   CARDIOVASCULAR: Heart RRR no murmurs, rubs or gallops. No peripheral edema.   GASTROINTESTINAL: Abdomen is soft, non-tender, not distended w/ normal bowel sounds. MUSCULOSKELETAL: No abnormal joints or musculature NEUROLOGIC:  Cranial nerves 2-12 grossly intact. Moves all extremities  PSYCHIATRIC: Mood and affect appropriate to situation, no behavioral issues      ASSESSMENT/ PLAN:  Will discharge him to home with ot/pt to evaluate and treat for gait; balance; strength and adl training. His prescriptions have been written for a 30 day supply of his medications with #30 oxycodone 5 mg tabs and #30 ativan 0.5 mg tabs. The facility is to setup appointment with Dr. Gaynelle Cage    Time spent with patient 40   minutes >50% time  spent counseling; reviewing medical record; tests; labs; and developing future plan of care      Ok Edwards NP [facility]  Contact [phone] Monday through Friday 8am- 5pm  After hours call [phone]

## 2015-08-26 NOTE — ED Notes (Signed)
Patient was sent for a CT of his neck due to an injury Oct 19 (fall)  They told the family they saw something different on the CT and reapplied the collar.  Left eye bruised and left upper lip bleeding

## 2015-08-26 NOTE — Discharge Instructions (Signed)
Go to the ED to rule out any acute head injury or other serious cause of your fall.

## 2015-08-27 NOTE — Discharge Instructions (Signed)
Cryotherapy °Cryotherapy means treatment with cold. Ice or gel packs can be used to reduce both pain and swelling. Ice is the most helpful within the first 24 to 48 hours after an injury or flare-up from overusing a muscle or joint. Sprains, strains, spasms, burning pain, shooting pain, and aches can all be eased with ice. Ice can also be used when recovering from surgery. Ice is effective, has very few side effects, and is safe for most people to use. °PRECAUTIONS  °Ice is not a safe treatment option for people with: °· Raynaud phenomenon. This is a condition affecting small blood vessels in the extremities. Exposure to cold may cause your problems to return. °· Cold hypersensitivity. There are many forms of cold hypersensitivity, including: °· Cold urticaria. Red, itchy hives appear on the skin when the tissues begin to warm after being iced. °· Cold erythema. This is a red, itchy rash caused by exposure to cold. °· Cold hemoglobinuria. Red blood cells break down when the tissues begin to warm after being iced. The hemoglobin that carry oxygen are passed into the urine because they cannot combine with blood proteins fast enough. °· Numbness or altered sensitivity in the area being iced. °If you have any of the following conditions, do not use ice until you have discussed cryotherapy with your caregiver: °· Heart conditions, such as arrhythmia, angina, or chronic heart disease. °· High blood pressure. °· Healing wounds or open skin in the area being iced. °· Current infections. °· Rheumatoid arthritis. °· Poor circulation. °· Diabetes. °Ice slows the blood flow in the region it is applied. This is beneficial when trying to stop inflamed tissues from spreading irritating chemicals to surrounding tissues. However, if you expose your skin to cold temperatures for too long or without the proper protection, you can damage your skin or nerves. Watch for signs of skin damage due to cold. °HOME CARE INSTRUCTIONS °Follow  these tips to use ice and cold packs safely. °· Place a dry or damp towel between the ice and skin. A damp towel will cool the skin more quickly, so you may need to shorten the time that the ice is used. °· For a more rapid response, add gentle compression to the ice. °· Ice for no more than 10 to 20 minutes at a time. The bonier the area you are icing, the less time it will take to get the benefits of ice. °· Check your skin after 5 minutes to make sure there are no signs of a poor response to cold or skin damage. °· Rest 20 minutes or more between uses. °· Once your skin is numb, you can end your treatment. You can test numbness by very lightly touching your skin. The touch should be so light that you do not see the skin dimple from the pressure of your fingertip. When using ice, most people will feel these normal sensations in this order: cold, burning, aching, and numbness. °· Do not use ice on someone who cannot communicate their responses to pain, such as small children or people with dementia. °HOW TO MAKE AN ICE PACK °Ice packs are the most common way to use ice therapy. Other methods include ice massage, ice baths, and cryosprays. Muscle creams that cause a cold, tingly feeling do not offer the same benefits that ice offers and should not be used as a substitute unless recommended by your caregiver. °To make an ice pack, do one of the following: °· Place crushed ice or a   bag of frozen vegetables in a sealable plastic bag. Squeeze out the excess air. Place this bag inside another plastic bag. Slide the bag into a pillowcase or place a damp towel between your skin and the bag.  Mix 3 parts water with 1 part rubbing alcohol. Freeze the mixture in a sealable plastic bag. When you remove the mixture from the freezer, it will be slushy. Squeeze out the excess air. Place this bag inside another plastic bag. Slide the bag into a pillowcase or place a damp towel between your skin and the bag. SEEK MEDICAL CARE  IF:  You develop white spots on your skin. This may give the skin a blotchy (mottled) appearance.  Your skin turns blue or pale.  Your skin becomes waxy or hard.  Your swelling gets worse. MAKE SURE YOU:   Understand these instructions.  Will watch your condition.  Will get help right away if you are not doing well or get worse.   This information is not intended to replace advice given to you by your health care provider. Make sure you discuss any questions you have with your health care provider.   Document Released: 04/18/2011 Document Revised: 09/12/2014 Document Reviewed: 04/18/2011 Elsevier Interactive Patient Education 2016 [employer]  Facial Laceration  A facial laceration is a cut on the face. These injuries can be painful and cause bleeding. Lacerations usually heal quickly, but they need special care to reduce scarring. DIAGNOSIS  Your health care provider will take a medical history, ask for details about how the injury occurred, and examine the wound to determine how deep the cut is. TREATMENT  Some facial lacerations may not require closure. Others may not be able to be closed because of an increased risk of infection. The risk of infection and the chance for successful closure will depend on various factors, including the amount of time since the injury occurred. The wound may be cleaned to help prevent infection. If closure is appropriate, pain medicines may be given if needed. Your health care provider will use stitches (sutures), wound glue (adhesive), or skin adhesive strips to repair the laceration. These tools bring the skin edges together to allow for faster healing and a better cosmetic outcome. If needed, you may also be given a tetanus shot. HOME CARE INSTRUCTIONS  Only take over-the-counter or prescription medicines as directed by your health care provider.  Follow your health care provider's instructions for wound care. These instructions will vary  depending on the technique used for closing the wound. For Sutures:  Keep the wound clean and dry.   If you were given a bandage (dressing), you should change it at least once a day. Also change the dressing if it becomes wet or dirty, or as directed by your health care provider.   Wash the wound with soap and water 2 times a day. Rinse the wound off with water to remove all soap. Pat the wound dry with a clean towel.   After cleaning, apply a thin layer of the antibiotic ointment recommended by your health care provider. This will help prevent infection and keep the dressing from sticking.   You may shower as usual after the first 24 hours. Do not soak the wound in water until the sutures are removed.   Get your sutures removed as directed by your health care provider. With facial lacerations, sutures should usually be taken out after 4-5 days to avoid stitch marks.   Wait a few days after your sutures  are removed before applying any makeup. For Skin Adhesive Strips:  Keep the wound clean and dry.   Do not get the skin adhesive strips wet. You may bathe carefully, using caution to keep the wound dry.   If the wound gets wet, pat it dry with a clean towel.   Skin adhesive strips will fall off on their own. You may trim the strips as the wound heals. Do not remove skin adhesive strips that are still stuck to the wound. They will fall off in time.  For Wound Adhesive:  You may briefly wet your wound in the shower or bath. Do not soak or scrub the wound. Do not swim. Avoid periods of heavy sweating until the skin adhesive has fallen off on its own. After showering or bathing, gently pat the wound dry with a clean towel.   Do not apply liquid medicine, cream medicine, ointment medicine, or makeup to your wound while the skin adhesive is in place. This may loosen the film before your wound is healed.   If a dressing is placed over the wound, be careful not to apply tape directly  over the skin adhesive. This may cause the adhesive to be pulled off before the wound is healed.   Avoid prolonged exposure to sunlight or tanning lamps while the skin adhesive is in place.  The skin adhesive will usually remain in place for 5-10 days, then naturally fall off the skin. Do not pick at the adhesive film.  After Healing: Once the wound has healed, cover the wound with sunscreen during the day for 1 full year. This can help minimize scarring. Exposure to ultraviolet light in the first year will darken the scar. It can take 1-2 years for the scar to lose its redness and to heal completely.  SEEK MEDICAL CARE IF:  You have a fever. SEEK IMMEDIATE MEDICAL CARE IF:  You have redness, pain, or swelling around the wound.   You see ayellowish-white fluid (pus) coming from the wound.    This information is not intended to replace advice given to you by your health care provider. Make sure you discuss any questions you have with your health care provider.   Document Released: 09/29/2004 Document Revised: 09/12/2014 Document Reviewed: 04/04/2013 Elsevier Interactive Patient Education 2016 [place] A contusion is a deep bruise. Contusions are the result of a blunt injury to tissues and muscle fibers under the skin. The injury causes bleeding under the skin. The skin overlying the contusion may turn blue, purple, or yellow. Minor injuries will give you a painless contusion, but more severe contusions may stay painful and swollen for a few weeks.  CAUSES  This condition is usually caused by a blow, trauma, or direct force to an area of the body. SYMPTOMS  Symptoms of this condition include:  Swelling of the injured area.  Pain and tenderness in the injured area.  Discoloration. The area may have redness and then turn blue, purple, or yellow. DIAGNOSIS  This condition is diagnosed based on a physical exam and medical history. An X-ray, CT scan, or MRI may be  needed to determine if there are any associated injuries, such as broken bones (fractures). TREATMENT  Specific treatment for this condition depends on what area of the body was injured. In general, the best treatment for a contusion is resting, icing, applying pressure to (compression), and elevating the injured area. This is often called the RICE strategy. Over-the-counter anti-inflammatory medicines may also be  recommended for pain control.  HOME CARE INSTRUCTIONS   Rest the injured area.  If directed, apply ice to the injured area:  Put ice in a plastic bag.  Place a towel between your skin and the bag.  Leave the ice on for 20 minutes, 2-3 times per day.  If directed, apply light compression to the injured area using an elastic bandage. Make sure the bandage is not wrapped too tightly. Remove and reapply the bandage as directed by your health care provider.  If possible, raise (elevate) the injured area above the level of your heart while you are sitting or lying down.  Take over-the-counter and prescription medicines only as told by your health care provider. SEEK MEDICAL CARE IF:  Your symptoms do not improve after several days of treatment.  Your symptoms get worse.  You have difficulty moving the injured area. SEEK IMMEDIATE MEDICAL CARE IF:   You have severe pain.  You have numbness in a hand or foot.  Your hand or foot turns pale or cold.   This information is not intended to replace advice given to you by your health care provider. Make sure you discuss any questions you have with your health care provider.   Document Released: 06/01/2005 Document Revised: 05/13/2015 Document Reviewed: 01/07/2015 Elsevier Interactive Patient Education [employer].

## 2015-08-28 LAB — URINE CULTURE

## 2015-09-07 DIAGNOSIS — K219 Gastro-esophageal reflux disease without esophagitis: Secondary | ICD-10-CM | POA: Diagnosis not present

## 2015-09-07 DIAGNOSIS — I25118 Atherosclerotic heart disease of native coronary artery with other forms of angina pectoris: Secondary | ICD-10-CM | POA: Diagnosis not present

## 2015-09-07 DIAGNOSIS — S12040S Displaced lateral mass fracture of first cervical vertebra, sequela: Secondary | ICD-10-CM | POA: Diagnosis not present

## 2015-09-07 DIAGNOSIS — K5712 Diverticulitis of small intestine without perforation or abscess without bleeding: Secondary | ICD-10-CM | POA: Diagnosis not present

## 2015-09-07 DIAGNOSIS — Z6829 Body mass index (BMI) 29.0-29.9, adult: Secondary | ICD-10-CM | POA: Diagnosis not present

## 2015-09-07 DIAGNOSIS — E784 Other hyperlipidemia: Secondary | ICD-10-CM | POA: Diagnosis not present

## 2015-09-07 DIAGNOSIS — I5022 Chronic systolic (congestive) heart failure: Secondary | ICD-10-CM | POA: Diagnosis not present

## 2015-09-07 DIAGNOSIS — M109 Gout, unspecified: Secondary | ICD-10-CM | POA: Diagnosis not present

## 2015-10-26 ENCOUNTER — Ambulatory Visit (INDEPENDENT_AMBULATORY_CARE_PROVIDER_SITE_OTHER): Payer: Medicare Other | Admitting: *Deleted

## 2015-10-26 DIAGNOSIS — I255 Ischemic cardiomyopathy: Secondary | ICD-10-CM | POA: Diagnosis not present

## 2015-10-26 DIAGNOSIS — Z95810 Presence of automatic (implantable) cardiac defibrillator: Secondary | ICD-10-CM | POA: Diagnosis not present

## 2015-10-26 DIAGNOSIS — I5022 Chronic systolic (congestive) heart failure: Secondary | ICD-10-CM

## 2015-10-26 DIAGNOSIS — I472 Ventricular tachycardia, unspecified: Secondary | ICD-10-CM

## 2015-10-26 NOTE — Progress Notes (Signed)
Remote ICD transmission.   

## 2015-11-08 ENCOUNTER — Encounter: Payer: Self-pay | Admitting: Cardiology

## 2015-11-08 LAB — CUP PACEART REMOTE DEVICE CHECK
Battery Remaining Longevity: 65 mo
Battery Voltage: 2.96 V
Brady Statistic AP VP Percent: 23.97 %
Brady Statistic AS VP Percent: 71.08 %
Brady Statistic AS VS Percent: 4.75 %
Brady Statistic RA Percent Paced: 24.17 %
Brady Statistic RV Percent Paced: 93.52 %
Date Time Interrogation Session: 20170220083425
HighPow Impedance: 31 Ohm
HighPow Impedance: 40 Ohm
Implantable Lead Implant Date: 20040709
Implantable Lead Implant Date: 20041111
Implantable Lead Implant Date: 20150520
Implantable Lead Location: 753858
Implantable Lead Location: 753860
Implantable Lead Model: 5071
Implantable Lead Model: 5076
Implantable Lead Model: 5076
Implantable Lead Model: 6947
Lead Channel Impedance Value: 4047 Ohm
Lead Channel Impedance Value: 4047 Ohm
Lead Channel Impedance Value: 475 Ohm
Lead Channel Pacing Threshold Amplitude: 0.5 V
Lead Channel Pacing Threshold Amplitude: 0.75 V
Lead Channel Pacing Threshold Amplitude: 1.375 V
Lead Channel Pacing Threshold Pulse Width: 0.4 ms
Lead Channel Pacing Threshold Pulse Width: 0.4 ms
Lead Channel Sensing Intrinsic Amplitude: 1.875 mV
Lead Channel Sensing Intrinsic Amplitude: 1.875 mV
Lead Channel Setting Pacing Amplitude: 1.5 V
Lead Channel Setting Pacing Amplitude: 2 V
Lead Channel Setting Pacing Amplitude: 2.5 V
Lead Channel Setting Sensing Sensitivity: 0.3 mV
MDC IDC LEAD IMPLANT DT: 20130206
MDC IDC LEAD LOCATION: 753859
MDC IDC LEAD LOCATION: 753860
MDC IDC MSMT LEADCHNL LV IMPEDANCE VALUE: 247 Ohm
MDC IDC MSMT LEADCHNL LV PACING THRESHOLD PULSEWIDTH: 0.4 ms
MDC IDC MSMT LEADCHNL RV IMPEDANCE VALUE: 304 Ohm
MDC IDC MSMT LEADCHNL RV IMPEDANCE VALUE: 456 Ohm
MDC IDC MSMT LEADCHNL RV SENSING INTR AMPL: 2.875 mV
MDC IDC MSMT LEADCHNL RV SENSING INTR AMPL: 2.875 mV
MDC IDC SET LEADCHNL LV PACING PULSEWIDTH: 0.4 ms
MDC IDC SET LEADCHNL RV PACING PULSEWIDTH: 0.4 ms
MDC IDC STAT BRADY AP VS PERCENT: 0.19 %

## 2015-11-08 NOTE — Progress Notes (Signed)
Abnormal remote check: back in atrial flutter.  No OAC 2/2 falls  Next [facility] 01/25/16

## 2016-01-13 ENCOUNTER — Encounter: Payer: Self-pay | Admitting: Internal Medicine

## 2016-01-25 ENCOUNTER — Ambulatory Visit (INDEPENDENT_AMBULATORY_CARE_PROVIDER_SITE_OTHER): Payer: Medicare Other | Admitting: *Deleted

## 2016-01-25 DIAGNOSIS — I5022 Chronic systolic (congestive) heart failure: Secondary | ICD-10-CM | POA: Diagnosis not present

## 2016-01-25 DIAGNOSIS — I472 Ventricular tachycardia, unspecified: Secondary | ICD-10-CM

## 2016-01-25 DIAGNOSIS — Z95810 Presence of automatic (implantable) cardiac defibrillator: Secondary | ICD-10-CM | POA: Diagnosis not present

## 2016-01-25 NOTE — Progress Notes (Signed)
Remote ICD transmission.   

## 2016-02-12 LAB — CUP PACEART REMOTE DEVICE CHECK
Brady Statistic AP VP Percent: 37.14 %
Brady Statistic AP VS Percent: 0.76 %
Brady Statistic AS VP Percent: 57.33 %
Brady Statistic RA Percent Paced: 37.9 %
Brady Statistic RV Percent Paced: 88.73 %
Date Time Interrogation Session: 20170522084224
HIGH POWER IMPEDANCE MEASURED VALUE: 38 Ohm
HighPow Impedance: 27 Ohm
Implantable Lead Implant Date: 20041111
Implantable Lead Implant Date: 20150520
Implantable Lead Location: 753859
Implantable Lead Location: 753860
Implantable Lead Model: 6947
Lead Channel Impedance Value: 247 Ohm
Lead Channel Impedance Value: 399 Ohm
Lead Channel Impedance Value: 4047 Ohm
Lead Channel Impedance Value: 4047 Ohm
Lead Channel Pacing Threshold Pulse Width: 0.4 ms
Lead Channel Pacing Threshold Pulse Width: 0.4 ms
Lead Channel Sensing Intrinsic Amplitude: 1.25 mV
Lead Channel Sensing Intrinsic Amplitude: 3.25 mV
Lead Channel Setting Pacing Amplitude: 1.5 V
Lead Channel Setting Pacing Amplitude: 2 V
Lead Channel Setting Pacing Pulse Width: 0.4 ms
Lead Channel Setting Pacing Pulse Width: 0.4 ms
MDC IDC LEAD IMPLANT DT: 20040709
MDC IDC LEAD IMPLANT DT: 20130206
MDC IDC LEAD LOCATION: 753858
MDC IDC LEAD LOCATION: 753860
MDC IDC MSMT BATTERY REMAINING LONGEVITY: 51 mo
MDC IDC MSMT BATTERY VOLTAGE: 2.96 V
MDC IDC MSMT LEADCHNL LV PACING THRESHOLD AMPLITUDE: 1.75 V
MDC IDC MSMT LEADCHNL RA IMPEDANCE VALUE: 475 Ohm
MDC IDC MSMT LEADCHNL RA PACING THRESHOLD AMPLITUDE: 0.75 V
MDC IDC MSMT LEADCHNL RA SENSING INTR AMPL: 1.25 mV
MDC IDC MSMT LEADCHNL RV IMPEDANCE VALUE: 304 Ohm
MDC IDC MSMT LEADCHNL RV PACING THRESHOLD AMPLITUDE: 0.75 V
MDC IDC MSMT LEADCHNL RV PACING THRESHOLD PULSEWIDTH: 0.4 ms
MDC IDC MSMT LEADCHNL RV SENSING INTR AMPL: 3.25 mV
MDC IDC SET LEADCHNL LV PACING AMPLITUDE: 2.75 V
MDC IDC SET LEADCHNL RV SENSING SENSITIVITY: 0.3 mV
MDC IDC STAT BRADY AS VS PERCENT: 4.77 %

## 2016-02-18 ENCOUNTER — Encounter: Payer: Self-pay | Admitting: Cardiology

## 2016-03-04 ENCOUNTER — Telehealth: Payer: Self-pay

## 2016-03-04 NOTE — Telephone Encounter (Signed)
Referred to ICM clinic by Debroah Loop, device RN.  Call to [facility] representative [name] and explained ICM program  She agreed to monthly calls.  1st ICM remote transmission scheduled for 03/21/2016.  She stated patient had office visit with Dr [name] last week and also saw NP this week.  She stated they are both aware patient has some leg swelling but he will not wear compression stockings.  Reviewed fluid symptoms and advised if leg swelling worsens to call.  Provided ICM number.  He does not weigh daily but current weight is around 149 lbs.

## 2016-03-21 ENCOUNTER — Ambulatory Visit (INDEPENDENT_AMBULATORY_CARE_PROVIDER_SITE_OTHER): Payer: Medicare Other

## 2016-03-21 ENCOUNTER — Telehealth: Payer: Self-pay

## 2016-03-21 DIAGNOSIS — I5022 Chronic systolic (congestive) heart failure: Secondary | ICD-10-CM

## 2016-03-21 DIAGNOSIS — Z95810 Presence of automatic (implantable) cardiac defibrillator: Secondary | ICD-10-CM

## 2016-03-21 NOTE — Telephone Encounter (Signed)
Spoke with medical records and requested faxed copy of latest labs.

## 2016-03-21 NOTE — Progress Notes (Signed)
EPIC Encounter for ICM Monitoring  Patient Name: [name] is a 82 y.o. male Date: 03/21/2016 Primary Care Physican: [name], MD Primary Cardiologist: [name] Electrophysiologist: [name] Dry Weight:  unknown Bi-V Pacing:  95%       Spoke with [name] (DPR).  Heart Failure questions reviewed, pt symptomatic with leg swelling and sometimes has more trouble breathing when walking.  Thoracic impedence decreased suggesting fluid accumulation.   LABS: Received labs from PCP office, Dr Ardeth Perfect 01/13/2016 Creatinine 1.0, BUN 15, Potassium 4.2, Sodium 142  Recommendations: Increase Furosemide to 40 mg bid x 3 days and after 3rd day return to prescribed dosage of 20 mg daily. Call if he has any feelings of weakness, dizziness, or lightheadedness.     ICM trend: 03/21/2016    Follow-up plan: ICM clinic phone appointment on 03/28/2016 to recheck fluid levels.  Copy of ICM check sent to primary cardiologist and device physician.   [name], RN 03/21/2016 10:33 AM

## 2016-03-25 ENCOUNTER — Telehealth: Payer: Self-pay | Admitting: Internal Medicine

## 2016-03-25 NOTE — Telephone Encounter (Signed)
NEw Message  Pt calling to speak with RN about pt reading. Pt wife ask if you could disregard the reading for today and she would send it on Monday. Please call back to discuss

## 2016-03-25 NOTE — Telephone Encounter (Signed)
ICM transmission scheduled for 03/28/2016

## 2016-03-28 ENCOUNTER — Ambulatory Visit (INDEPENDENT_AMBULATORY_CARE_PROVIDER_SITE_OTHER)

## 2016-03-28 DIAGNOSIS — Z95810 Presence of automatic (implantable) cardiac defibrillator: Secondary | ICD-10-CM

## 2016-03-28 DIAGNOSIS — I5022 Chronic systolic (congestive) heart failure: Secondary | ICD-10-CM

## 2016-03-28 NOTE — Progress Notes (Signed)
EPIC Encounter for ICM Monitoring  Patient Name: [name] is a 82 y.o. male Date: 03/28/2016 Primary Care Physican: [name], MD Primary Cardiologist: [name] Electrophysiologist: [name] Dry Weight: unknown Bi-V Pacing:  93.6%       Spoke with [name] (DPR).  Heart Failure questions reviewed, pt symptomatic with slight leg swelling which is not new for patient.  Since last ICM transmission on 03/21/2016, Thoracic impedence remains decreased even after taking 3 days of increase Furosemide dosage of 40 mg.  Recommendations:  Repeat transmission on 04/01/2016.   ICM trend: 03/28/2016    Follow-up plan: ICM clinic phone appointment on 04/01/2016.  Copy of ICM check sent to primary cardiologist and device physician.   [name], RN 03/28/2016 8:47 AM

## 2016-04-01 ENCOUNTER — Ambulatory Visit (INDEPENDENT_AMBULATORY_CARE_PROVIDER_SITE_OTHER): Payer: Medicare Other

## 2016-04-01 ENCOUNTER — Telehealth: Payer: Self-pay

## 2016-04-01 DIAGNOSIS — Z95810 Presence of automatic (implantable) cardiac defibrillator: Secondary | ICD-10-CM

## 2016-04-01 DIAGNOSIS — I5022 Chronic systolic (congestive) heart failure: Secondary | ICD-10-CM

## 2016-04-01 NOTE — Progress Notes (Signed)
EPIC Encounter for ICM Monitoring  Patient Name: [name] is a 82 y.o. male Date: 04/01/2016 Primary Care Physican: [name], MD Primary Cardiologist: [name] Electrophysiologist: [name] Dry Weight: unknown Bi-V Pacing:  91.9%       Attempted call to [name] (DPR) and unable to reach.  Transmission reviewed.   Thoracic impedance remains abnormal suggesting fluid accumulation even after increasing Furosemide x 3 days on 03/21/2016.  LABS: Received labs from PCP office, Dr Ardeth Perfect 01/13/2016 Creatinine 1.0, BUN 15, Potassium 4.2, Sodium 142   ICM trend: 04/01/2016    Follow-up plan: ICM clinic phone appointment on 04/12/2016.  Copy of ICM check sent to primary cardiologist and device physician.   [name] Billings, RN 04/01/2016 2:26 PM

## 2016-04-01 NOTE — Telephone Encounter (Signed)
Remote ICM transmission received.  Attempted call to [name] DPR, and left message for return call.

## 2016-04-12 ENCOUNTER — Telehealth: Payer: Self-pay | Admitting: Cardiology

## 2016-04-12 ENCOUNTER — Telehealth: Payer: Self-pay | Admitting: Internal Medicine

## 2016-04-12 ENCOUNTER — Encounter

## 2016-04-12 NOTE — Telephone Encounter (Signed)
New Message  Pt daughter call stating she is out of town and did not know if pt sent his transmission. But she stated pt does not know how to sent it on his own. Pt daughter wants to speak with RN to see if it would be okay to send the transmission when she returns back home. Please call back to discuss

## 2016-04-12 NOTE — Telephone Encounter (Signed)
Called pts daughter and let her know that we could reschedule the transmission for Monday. Pts daughter voiced understanding.

## 2016-04-12 NOTE — Telephone Encounter (Signed)
LMOVM reminding pt to send remote transmission.   

## 2016-04-18 ENCOUNTER — Telehealth: Payer: Self-pay | Admitting: Cardiology

## 2016-04-18 ENCOUNTER — Ambulatory Visit (INDEPENDENT_AMBULATORY_CARE_PROVIDER_SITE_OTHER): Payer: Medicare Other

## 2016-04-18 DIAGNOSIS — Z95810 Presence of automatic (implantable) cardiac defibrillator: Secondary | ICD-10-CM

## 2016-04-18 DIAGNOSIS — I5022 Chronic systolic (congestive) heart failure: Secondary | ICD-10-CM

## 2016-04-18 NOTE — Telephone Encounter (Signed)
LMOVM reminding pt to send remote transmission.   

## 2016-04-19 NOTE — Progress Notes (Signed)
EPIC Encounter for ICM Monitoring  Patient Name: [name] is a 82 y.o. male Date: 04/19/2016 Primary Care Physican: [name], MD Primary Cardiologist: [name] Electrophysiologist: [name] Dry Weight:  unknown Bi-V Pacing:  90.9%       Heart Failure questions reviewed, pt symptomatic with legs swollen and hard to touch and some SOB  Thoracic impedance continues to be abnormal suggesting fluid accumulation.  LABS:       01/13/2016 Creatinine 1.0, BUN 15, Potassium 4.2, Sodium 142 08/26/2015 Creatinine 1.44, BUN 12, Potassium 4.7, Sodium 142 06/23/2016 Creatinine 1.24, BUN 16, Potassium 4.0, Sodium 141  Recommendations: Reviewed with [name] in the office.  Call back to friend [name] [facility]) and advised to give Furosemide 20 mg - 3 tablets (60 mg total) this evening and am for the next 2 days.  He should return to the prescribed dosage of Furosemide 20 mg 1 tablet daily after 3rd day.  Advised to call back if symptoms worsen or patient has dizziness, lightheadedness or weakness.  Encouraged to call Dr Zenia Resides office to get an appointment asap.  She stated she will call his office.  She verbalized understanding of medication increase.  Repeat ICM remote transmission 04/22/2016.   Follow-up plan: ICM clinic phone appointment on 04/22/2016.  Copy of ICM check sent to primary cardiologist and device physician.   ICM trend: 04/19/2016        [name], RN 04/19/2016 2:58 PM

## 2016-04-22 ENCOUNTER — Telehealth: Payer: Self-pay

## 2016-04-22 ENCOUNTER — Ambulatory Visit (INDEPENDENT_AMBULATORY_CARE_PROVIDER_SITE_OTHER): Payer: Medicare Other

## 2016-04-22 DIAGNOSIS — Z95810 Presence of automatic (implantable) cardiac defibrillator: Secondary | ICD-10-CM | POA: Diagnosis not present

## 2016-04-22 DIAGNOSIS — I5022 Chronic systolic (congestive) heart failure: Secondary | ICD-10-CM

## 2016-04-22 NOTE — Progress Notes (Signed)
EPIC Encounter for ICM Monitoring  Patient Name: [name] is a 82 y.o. male Date: 04/22/2016 Primary Care Physican: [name], MD Primary [place] Electrophysiologist: [name] Dry Weight:  unknown Bi-V Pacing:  87% (decreased from 95% on 03/21/2016)       Spoke with [name] (DPR). Heart Failure questions reviewed, pt symptomatic with SOB, tiredness and leg swelling  Since 04/19/2016 transmission no improvement in thoracic impedance after increasing Furosemide.  Impedance still showing fluid accumulation.  Recommendations: No changes.  Patient has appointment with Dr [name] on 04/25/2016.  Will follow up with patient's friend, [name] to determine when next transmission will be and Dr [name] recommendations.     Follow-up plan: ICM clinic phone appointment on pending outcome of office visit with Dr [name].  Copy of ICM check sent to primary cardiologist and device physician.   ICM trend: 04/22/2016       [name], RN 04/22/2016 8:36 AM

## 2016-04-22 NOTE — Telephone Encounter (Signed)
Remote ICM transmission received.  Attempted call to friend, [name] [facility]) and left message for return call.

## 2016-04-25 ENCOUNTER — Ambulatory Visit (INDEPENDENT_AMBULATORY_CARE_PROVIDER_SITE_OTHER): Payer: Medicare Other | Admitting: *Deleted

## 2016-04-25 DIAGNOSIS — I472 Ventricular tachycardia, unspecified: Secondary | ICD-10-CM

## 2016-04-25 DIAGNOSIS — Z95810 Presence of automatic (implantable) cardiac defibrillator: Secondary | ICD-10-CM

## 2016-04-25 DIAGNOSIS — I255 Ischemic cardiomyopathy: Secondary | ICD-10-CM

## 2016-04-25 NOTE — Progress Notes (Signed)
Remote ICD transmission.   

## 2016-04-26 NOTE — Progress Notes (Addendum)
Received call from [name], friend (DPR).  She reported patient visited Dr [name] yesterday and he increased Furosemide to 40 mg daily and scheduled Echocardiogram for 04/27/2016.  Office weight was 158lbs. Explained the increased Furosemide dosage should decrease leg swelling.  Will repeat ICM remote transmission on 05/03/2016.  She stated Dr [name] has not been getting copy of ICM reports.  Advised will call Dr Zenia Resides office to get fax number and will fax copy.  Call to Dr Zenia Resides office and fax number is [phone]

## 2016-04-27 ENCOUNTER — Encounter: Payer: Self-pay | Admitting: Cardiology

## 2016-04-28 ENCOUNTER — Telehealth: Payer: Self-pay

## 2016-04-28 NOTE — Telephone Encounter (Signed)
Received voice mail from [name], [place] and requested a return call.  Returned call.  She stated she has changed the time she gives patient Furosemide because when she gives the medication to him in the mornings he is up all night.  She is going to give it to him in the evening and hoping the effects will be more during the day than night time.  Patient had echo completed today.  No further changes.  Next ICM transmission 05/03/2016 to recheck fluid levels.

## 2016-05-03 ENCOUNTER — Ambulatory Visit (INDEPENDENT_AMBULATORY_CARE_PROVIDER_SITE_OTHER): Payer: Medicare Other

## 2016-05-03 DIAGNOSIS — I5022 Chronic systolic (congestive) heart failure: Secondary | ICD-10-CM

## 2016-05-03 DIAGNOSIS — Z95810 Presence of automatic (implantable) cardiac defibrillator: Secondary | ICD-10-CM

## 2016-05-03 LAB — CUP PACEART REMOTE DEVICE CHECK
Battery Voltage: 2.95 V
Brady Statistic AP VS Percent: 0.84 %
Brady Statistic AS VP Percent: 24.14 %
Brady Statistic AS VS Percent: 1.68 %
HIGH POWER IMPEDANCE MEASURED VALUE: 26 Ohm
HIGH POWER IMPEDANCE MEASURED VALUE: 36 Ohm
Implantable Lead Implant Date: 20041111
Implantable Lead Implant Date: 20130206
Implantable Lead Implant Date: 20150520
Implantable Lead Location: 753859
Implantable Lead Location: 753860
Implantable Lead Model: 5071
Implantable Lead Model: 5076
Implantable Lead Model: 6947
Lead Channel Impedance Value: 247 Ohm
Lead Channel Impedance Value: 304 Ohm
Lead Channel Impedance Value: 4047 Ohm
Lead Channel Impedance Value: 475 Ohm
Lead Channel Pacing Threshold Amplitude: 2 V
Lead Channel Pacing Threshold Pulse Width: 0.4 ms
Lead Channel Pacing Threshold Pulse Width: 0.4 ms
Lead Channel Pacing Threshold Pulse Width: 0.4 ms
Lead Channel Sensing Intrinsic Amplitude: 2.875 mV
Lead Channel Setting Pacing Amplitude: 1.5 V
Lead Channel Setting Pacing Amplitude: 2 V
Lead Channel Setting Pacing Amplitude: 3 V
Lead Channel Setting Pacing Pulse Width: 0.4 ms
MDC IDC LEAD IMPLANT DT: 20040709
MDC IDC LEAD LOCATION: 753858
MDC IDC LEAD LOCATION: 753860
MDC IDC MSMT BATTERY REMAINING LONGEVITY: 44 mo
MDC IDC MSMT LEADCHNL LV IMPEDANCE VALUE: 4047 Ohm
MDC IDC MSMT LEADCHNL RA PACING THRESHOLD AMPLITUDE: 0.75 V
MDC IDC MSMT LEADCHNL RA SENSING INTR AMPL: 1.125 mV
MDC IDC MSMT LEADCHNL RA SENSING INTR AMPL: 1.125 mV
MDC IDC MSMT LEADCHNL RV IMPEDANCE VALUE: 399 Ohm
MDC IDC MSMT LEADCHNL RV PACING THRESHOLD AMPLITUDE: 0.75 V
MDC IDC MSMT LEADCHNL RV SENSING INTR AMPL: 2.875 mV
MDC IDC SESS DTM: 20170821083724
MDC IDC SET LEADCHNL RV PACING PULSEWIDTH: 0.4 ms
MDC IDC SET LEADCHNL RV SENSING SENSITIVITY: 0.3 mV
MDC IDC STAT BRADY AP VP PERCENT: 73.34 %
MDC IDC STAT BRADY RA PERCENT PACED: 74.18 %
MDC IDC STAT BRADY RV PERCENT PACED: 86.81 %

## 2016-05-03 NOTE — Progress Notes (Signed)
EPIC Encounter for ICM Monitoring  Patient Name: [name] is a 82 y.o. male Date: 05/03/2016 Primary Care Physican: [name], MD Primary [place] Electrophysiologist: [name] Dry Weight: unknown Bi-V Pacing:  87% (decreased from 95% on 03/21/2016)       Spoke with [name], friend (DPR).  She reported she has not seen patient since 04/22/2016 but will check on him tomorrow.  He has been having shortness of breath and leg swelling.  She will call back after checking on patient.   Since 04/22/2016 transmission, thoracic impedance continues to be abnormal after Furosemide dosage increase on 04/18/2016 x 3 days  Recommendations: Reviewed transmission with Dr [name] and recommendation to increase Furosemide 20 mg to 3 tablets (60 mg total) daily and obtain BMP in a week.   Follow-up plan: ICM clinic phone appointment on 05/11/2016.  Copy of ICM check sent to primary cardiologist and device physician.   ICM trend: 05/03/2016       [name], RN 05/03/2016 8:58 AM

## 2016-05-04 MED ORDER — FUROSEMIDE 20 MG PO TABS: ORAL_TABLET | ORAL | 3 refills | Status: DC

## 2016-05-04 NOTE — Progress Notes (Signed)
Attempted 2nd call to [name], friend (DPR).  Requested return call.

## 2016-05-04 NOTE — Progress Notes (Signed)
[name] returned call.  Advised Dr [name] recommended to increase Furosemide 20 mg to 3 tablets (60 mg total) daily and have lab work drawn in a week at [facility] on 05/12/2016.  She stated Dr [name] had increased the Furosemide to 40 mg on 04/25/2016.  Explained remote transmission is unchanged.  She stated she will start the 60 mg tomorrow and requested.  Confirmed pharmacy address and she wanted to continue with 20 mg tablets and will give him 3 as prescribed.  Advised to call if there are any changes and to monitor for changes in leg swelling and breathing.   She verbalized understanding of medication change.  Next ICM remote transmission scheduled for 05/11/2016

## 2016-05-11 ENCOUNTER — Ambulatory Visit (INDEPENDENT_AMBULATORY_CARE_PROVIDER_SITE_OTHER): Payer: Medicare Other

## 2016-05-11 DIAGNOSIS — I5022 Chronic systolic (congestive) heart failure: Secondary | ICD-10-CM

## 2016-05-11 DIAGNOSIS — Z95810 Presence of automatic (implantable) cardiac defibrillator: Secondary | ICD-10-CM

## 2016-05-11 NOTE — Progress Notes (Signed)
EPIC Encounter for ICM Monitoring  Patient Name: [name] is a 82 y.o. male Date: 05/11/2016 Primary Care Physican: [name], MD Primary [place] Electrophysiologist: [name] Dry Weight:  unknown Bi-V Pacing:  81.1% (87 % on 8/29 and 95% on 03/21/2016)                       Heart Failure questions reviewed, pt symptomatic with leg swelling.  He stated his breathing is always bad and sometimes has difficulty breathing when lying down.  He reported weight gain in the last week of about 5 pounds but he only weighs once a week.   After increasing Furosemide 20 mg to 3 tablets (60 mg total) daily on 05/03/2016, thoracic impedance continues to be abnormal.  Recommendations:  Reviewed impedance and Bi-V Pacing with Dr [name] in the office.  Recommendation to continue Furosemide 20 mg 3 tablets daily.  Patient came to office for BMET  today.    Follow-up plan: ICM clinic phone appointment on 05/26/2016.  Copy of ICM check sent to primary cardiologist and device physician.   ICM trend: 05/11/2016       [name], RN 05/11/2016 9:10 AM

## 2016-05-12 ENCOUNTER — Telehealth: Payer: Self-pay

## 2016-05-12 ENCOUNTER — Encounter (INDEPENDENT_AMBULATORY_CARE_PROVIDER_SITE_OTHER): Payer: Self-pay

## 2016-05-12 ENCOUNTER — Other Ambulatory Visit: Payer: Medicare Other | Admitting: *Deleted

## 2016-05-12 DIAGNOSIS — I5022 Chronic systolic (congestive) heart failure: Secondary | ICD-10-CM

## 2016-05-12 LAB — BASIC METABOLIC PANEL WITH GFR
BUN: 16 mg/dL (ref 7–25)
CHLORIDE: 108 mmol/L (ref 98–110)
CO2: 22 mmol/L (ref 20–31)
Calcium: 8.7 mg/dL (ref 8.6–10.3)
Creat: 1.22 mg/dL — ABNORMAL HIGH (ref 0.70–1.11)
GFR, EST NON AFRICAN AMERICAN: 53 mL/min — AB (ref >=60)
GFR, Est African American: 61 mL/min (ref >=60)
Glucose, Bld: 213 mg/dL — ABNORMAL HIGH (ref 65–99)
POTASSIUM: 3 mmol/L — AB (ref 3.5–5.3)
SODIUM: 142 mmol/L (ref 135–146)

## 2016-05-12 NOTE — Addendum Note (Signed)
Addended by: Eulis Foster on: 05/12/2016 01:30 PM   Modules accepted: Orders

## 2016-05-12 NOTE — Telephone Encounter (Signed)
Remote ICM transmission received.  Attemptedcall to friend [name], [place], and left message for return call.

## 2016-05-25 ENCOUNTER — Telehealth: Payer: Self-pay

## 2016-05-25 DIAGNOSIS — I5022 Chronic systolic (congestive) heart failure: Secondary | ICD-10-CM

## 2016-05-25 MED ORDER — POTASSIUM CHLORIDE CRYS ER 20 MEQ PO TBCR: 20.0000 meq | EXTENDED_RELEASE_TABLET | Freq: Every day | ORAL | 3 refills | Status: DC

## 2016-05-25 NOTE — Telephone Encounter (Signed)
Call to Fanny Bien (DPR) with lab results.  Advised Potassium 20 mEq was ordered by Dr [name] to be started today and confirmed pharmacy.  BMET on 06/08/2016.  Reviewed foods high in potassium. She verbalized understanding and will start potassium today.

## 2016-05-26 ENCOUNTER — Telehealth: Payer: Self-pay

## 2016-05-26 ENCOUNTER — Ambulatory Visit (INDEPENDENT_AMBULATORY_CARE_PROVIDER_SITE_OTHER): Payer: Medicare Other

## 2016-05-26 DIAGNOSIS — Z95810 Presence of automatic (implantable) cardiac defibrillator: Secondary | ICD-10-CM

## 2016-05-26 DIAGNOSIS — I5022 Chronic systolic (congestive) heart failure: Secondary | ICD-10-CM

## 2016-05-26 NOTE — Progress Notes (Signed)
EPIC Encounter for ICM Monitoring  Patient Name: [name] is a 82 y.o. male Date: 05/26/2016 Primary Care Physican: [name], MD Primary [place] Electrophysiologist: Druscilla Brownie Weight:unknown Bi-V Pacing: 74.7% (87 % on 8/29 and 95% on 03/21/2016)      Spoke with friend, [name] [facility]).  Heart Failure questions reviewed, she reported patient thinks legs are more swollen and now has a blister with oozing.  Advised her to call PCP office today to make an appointment and ask if Home health nurse can be ordered due legs are worsening and increase risk in infection.  She stated she would call today.   She denied any changes in breathing.  Thoracic impedance continues to be abnormal suggesting fluid accumulation even with increase in Furosemide to 60 mg daily on 05/03/2016.  Confirmed patient started Potassium medication on 05/25/2016.  Recommendations:  [facility] referral completed requesting home health and caregiver to call Dr Ardeth Perfect office today for appointment and request home health.      Follow-up plan: ICM clinic phone appointment on 06/17/2016 to recheck fluid levels.  Copy of ICM check sent to PCP, primary cardiologist and device physician.   ICM trend: 05/26/2016       [name], RN 05/26/2016 9:24 AM

## 2016-05-26 NOTE — Telephone Encounter (Signed)
Called Ms. Izola Price and let her know that pt received ATPx1 on May 17, 2016 and ATPx1 on May 14, 2016, both were successful in terminating the arrhythmia that pt was in.  She could not recall pt reporting any symptoms on these days. Informed her of driving restrictions, she stated that pt does not drive anyway.

## 2016-05-27 ENCOUNTER — Other Ambulatory Visit: Payer: Self-pay

## 2016-05-27 ENCOUNTER — Telehealth: Payer: Self-pay

## 2016-05-27 NOTE — Telephone Encounter (Signed)
Received call from patients caregiver, [name].  She and patient were in PCP office and the student NP wanted to get an update.  Spoke with Katie at [facility] office and provided latest information regarding last transmission, recent VT treated by device and start of Potassium due to low level.  She stated it was very helpful to get more information and would place ICM name and number in chart for future reference.

## 2016-05-27 NOTE — Patient Outreach (Addendum)
[facility]) Care Management  05/27/2016  [name] May 26, 1927 [id number]     Telephone Screen  Referral Date: 05/26/16 Referral Source: MD office([facility]) Referral Reason: " patient needs assistance due to legs edematous and starting to blister"    Outreach attempt # 1 to patient. Spoke with patient. He reported he was not feeling well. Patient gave verbal consent for RN CM to contact his friend/POA(Vivian Izola Price). Phone call made to caregiver and screening completed.   Social: Patient resides in his home alone. His friend/caregiver lives nearby and assists with his care needs. Patient fairly independent with ADLs. However, reports that due to patient's declining condition its getting harder for him to perform activities. He does not drive as caregiver takes him to appts. No recent falls. Last fall was a year ago. DME in the home include: scale, cane, walker and wheelchair(not currently using).    Conditions: Patient has PMH of CAD, CHF, HTN, HLD, ischemic cardiomyopathy s/p ICD placement and GERD. Patient currently having ongoing issues with fluid retention related to CHF. Caregiver reports that patient has scale in the home but does not consistently weigh. She reports that his diuretic dosage was recently increased. However, patient continues to have ongoing issues with BLE edema. Caregiver also voices some concerns that patient appears to be short winded and out of breath at times. He had oxygen in the home a year ago but no longer has it in the home as it was provided through hospice. She reports that patient was on hospice services last year. However, he sustained a fall and was admitted to the hospital. During hospital admit he revoked his hospice benefits and did not want to go back on hospice services.    Medications: Patient taking less than 15 meds per caregiver report. She denies any issues with affording meds. She states she fills med planner  weekly for patient.    Appointments: Patient was at heart center earlier this week. He has appt with PCP this afternoon for further eval.   Advance Directives: Caregiver states that patient has completed  Dual HCPOA and she is designated person.   Consent: [facility] services discussed and reviewed. Verbal consent given for [facility] services.   Plan: RN CM will notify [facility] administrative assistant of case status. RN CM will send [facility] community nurse referral for further in home eval and assessment of care needs. RN CM provided caregiver with [facility] 24hr Nurse Line contact info.  Enzo Montgomery, RN,BSN,CCM [place] [facility] Telephonic Care Management Coordinator Direct Phone: [phone] Toll Free: [phone] Fax: [phone]

## 2016-05-31 ENCOUNTER — Telehealth: Payer: Self-pay

## 2016-05-31 ENCOUNTER — Emergency Department (HOSPITAL_COMMUNITY): Payer: Medicare Other

## 2016-05-31 ENCOUNTER — Emergency Department (HOSPITAL_COMMUNITY)
Admission: EM | Admit: 2016-05-31 | Discharge: 2016-05-31 | Disposition: A | Discharge status: Home / Self Care | Payer: Medicare Other | Attending: Emergency Medicine | Admitting: Emergency Medicine

## 2016-05-31 ENCOUNTER — Encounter (HOSPITAL_COMMUNITY): Payer: Self-pay

## 2016-05-31 DIAGNOSIS — I5022 Chronic systolic (congestive) heart failure: Secondary | ICD-10-CM | POA: Insufficient documentation

## 2016-05-31 DIAGNOSIS — E1165 Type 2 diabetes mellitus with hyperglycemia: Secondary | ICD-10-CM | POA: Diagnosis not present

## 2016-05-31 DIAGNOSIS — E876 Hypokalemia: Secondary | ICD-10-CM | POA: Insufficient documentation

## 2016-05-31 DIAGNOSIS — I110 Hypertensive heart disease with heart failure: Secondary | ICD-10-CM | POA: Diagnosis not present

## 2016-05-31 DIAGNOSIS — Z95810 Presence of automatic (implantable) cardiac defibrillator: Secondary | ICD-10-CM | POA: Diagnosis not present

## 2016-05-31 DIAGNOSIS — Z951 Presence of aortocoronary bypass graft: Secondary | ICD-10-CM | POA: Diagnosis not present

## 2016-05-31 DIAGNOSIS — I2510 Atherosclerotic heart disease of native coronary artery without angina pectoris: Secondary | ICD-10-CM | POA: Insufficient documentation

## 2016-05-31 DIAGNOSIS — M7989 Other specified soft tissue disorders: Secondary | ICD-10-CM | POA: Diagnosis present

## 2016-05-31 DIAGNOSIS — I509 Heart failure, unspecified: Secondary | ICD-10-CM

## 2016-05-31 LAB — BASIC METABOLIC PANEL
ANION GAP: 10 (ref 5–15)
BUN: 15 mg/dL (ref 6–20)
CO2: 22 mmol/L (ref 22–32)
CREATININE: 1.13 mg/dL (ref 0.61–1.24)
Calcium: 8.7 mg/dL — ABNORMAL LOW (ref 8.9–10.3)
Chloride: 107 mmol/L (ref 101–111)
GFR calc Af Amer: >60 mL/min (ref >60)
GFR calc non Af Amer: 56 mL/min — ABNORMAL LOW (ref >60)
GLUCOSE: 168 mg/dL — AB (ref 65–99)
POTASSIUM: 3.4 mmol/L — AB (ref 3.5–5.1)
Sodium: 139 mmol/L (ref 135–145)

## 2016-05-31 LAB — CBC
HEMATOCRIT: 28.3 % — AB (ref 39.0–52.0)
Hemoglobin: 9.4 g/dL — ABNORMAL LOW (ref 13.0–17.0)
MCH: 34.1 pg — ABNORMAL HIGH (ref 26.0–34.0)
MCHC: 33.2 g/dL (ref 30.0–36.0)
MCV: 102.5 fL — AB (ref 78.0–100.0)
PLATELETS: 77 10*3/uL — AB (ref 150–400)
RBC: 2.76 MIL/uL — ABNORMAL LOW (ref 4.22–5.81)
RDW: 15.6 % — AB (ref 11.5–15.5)
WBC: 2.5 10*3/uL — AB (ref 4.0–10.5)

## 2016-05-31 LAB — BRAIN NATRIURETIC PEPTIDE: B Natriuretic Peptide: 2269.9 pg/mL — ABNORMAL HIGH (ref 0.0–100.0)

## 2016-05-31 LAB — MAGNESIUM: Magnesium: 1.6 mg/dL — ABNORMAL LOW (ref 1.7–2.4)

## 2016-05-31 MED ORDER — POTASSIUM CHLORIDE CRYS ER 20 MEQ PO TBCR
40.0000 meq | EXTENDED_RELEASE_TABLET | Freq: Once | ORAL | Status: AC
  Administered 2016-05-31: 40 meq via ORAL
  Filled 2016-05-31: qty 2

## 2016-05-31 MED ORDER — FUROSEMIDE 10 MG/ML IJ SOLN
40.0000 mg | Freq: Once | INTRAMUSCULAR | Status: AC
  Administered 2016-05-31: 40 mg via INTRAVENOUS
  Filled 2016-05-31: qty 4

## 2016-05-31 NOTE — ED Provider Notes (Addendum)
[name] Provider Note   CSN: [id number] Arrival date & time: 05/31/16  1508     History   Chief Complaint Chief Complaint  Patient presents with  . Leg Swelling    HPI [name] is a 82 y.o. male.She is obtained from patient and from patient's healthcare power of attorney VivianRobison who accompanies him. Patient complains of the lateral leg swelling the past 3-4 weeks. He states he may been a little short of breath earlier today but denies any shortness of breath presently. He is presently asymptomatic except for leg swelling. Patient noted to have BNP of 3853 from blood draw yesterday, advised to come to the emergency department for further evaluation today. No treatment prior to coming here. No other associated symptoms.  HPI  Past Medical History:  Diagnosis Date  . AICD (automatic cardioverter/defibrillator) present   . Arthritis    "shoulders" (10/31/2014)  . CAD (coronary artery disease)    s/p CABG; s/p Pacemaker  . Diverticulosis of colon   . GERD (gastroesophageal reflux disease)   . Gout   . Hyperlipidemia   . Hypertension   . Ischemic cardiomyopathy    s/p ICD  . On home oxygen therapy    "2L prn" (10/31/2014)  . Paroxysmal ventricular tachycardia ([place])   . SBO (small bowel obstruction) ([place]) 10/31/2014  . Shortness of breath dyspnea    with exertion  . Systolic CHF with reduced left ventricular function, NYHA class 2 [facility])     Patient Active Problem List   Diagnosis Date Noted  . Acute delirium 06/29/2015  . Orthostatic hypotension 06/29/2015  . VT (ventricular tachycardia) ([place])   . C1 cervical fracture ([place]) 06/24/2015  . Anemia of chronic disease 06/24/2015  . Laceration 06/24/2015  . Intractable pain 06/24/2015  . A-fib ([place]) 06/24/2015  . Hypertension   . GERD (gastroesophageal reflux disease)   . Protein-calorie malnutrition, severe ([place]) 11/02/2014  . Partial small bowel obstruction ([place]) 10/31/2014  . DM type 2 causing  vascular disease ([place]) 10/31/2014  . Acute renal failure ([place]) 10/31/2014  . Do not resuscitate 10/31/2014  . Hospice care 10/31/2014  . Small bowel obstruction due to postoperative adhesions ([place]) 10/31/2014  . Acute kidney injury ([place])   . Cardiomyopathy, ischemic   . Atrial flutter ([place]) 09/02/2014  . Acute systolic heart failure ([place]) 01/21/2014  . Chronic systolic heart failure ([place]) 07/23/2009  . Automatic implantable cardioverter-defibrillator in situ 07/23/2009  . Coronary atherosclerosis 07/17/2009  . CORONARY ARTERY BYPASS GRAFT, HX OF 07/17/2009    Past Surgical History:  Procedure Laterality Date  . BI-VENTRICULAR IMPLANTABLE CARDIOVERTER DEFIBRILLATOR UPGRADE N/A 01/22/2014   Procedure: BI-VENTRICULAR IMPLANTABLE CARDIOVERTER DEFIBRILLATOR UPGRADE;  Surgeon: Evans Lance, MD;  Location: [facility] CATH LAB;  Service: Cardiovascular;  Laterality: N/A;  . BOWEL RESECTION    . CARDIAC CATHETERIZATION  03/2011   Archie Endo 01/18/2011  . CARDIAC DEFIBRILLATOR PLACEMENT  07/2003   Archie Endo 01/18/2011  . CATARACT EXTRACTION W/ INTRAOCULAR LENS  IMPLANT, BILATERAL Bilateral   . CATARACT EXTRACTION W/PHACO Right 12/17/2014   Procedure: PHACOEMULSIFICATION CATARACT EXTRACTION WITH IOL IMPLANT RIGHT EYE;  Surgeon: Marylynn Pearson, MD;  Location: [place];  Service: Ophthalmology;  Laterality: Right;  . CHOLECYSTECTOMY    . CORONARY ANGIOPLASTY WITH STENT PLACEMENT  04/2011   2 stents/notes 05/03/2011  . CORONARY ARTERY BYPASS GRAFT  03/2003   CABG X3/notes 01/18/2011  . EYE SURGERY Bilateral    [place]  . IMPLANTABLE CARDIOVERTER DEFIBRILLATOR (ICD) GENERATOR CHANGE Left 10/12/2011  Procedure: ICD GENERATOR CHANGE;  Surgeon: Evans Lance, MD;  Location: [facility] CATH LAB;  Service: Cardiovascular;  Laterality: Left;  . PACEMAKER PLACEMENT    . PACEMAKER REVISION N/A 10/12/2011   Procedure: PACEMAKER REVISION;  Surgeon: Evans Lance, MD;  Location[facility] C[facility] LAB;  Service: Cardiovascular;  Laterality: N/A;  .  TEE WITH CARDIOVERSION  03/2003   Archie Endo 01/18/2011  . TONSILLECTOMY         Home Medications    Prior to Admission medications   Medication Sig Start Date End Date Taking? Authorizing Provider  acetaminophen (TYLENOL) 500 MG tablet Take 2 tablets (1,000 mg total) by mouth 3 (three) times daily. 06/28/15   Shanker Kristeen Mans, MD  allopurinol (ZYLOPRIM) 300 MG tablet Take 300 mg by mouth daily.      Historical Provider, MD  carvedilol (COREG) 6.25 MG tablet Take 1 tablet (6.25 mg total) by mouth 2 (two) times daily. 06/28/15   Shanker Kristeen Mans, MD  divalproex (DEPAKOTE SPRINKLE) 125 MG capsule Take 2 capsules (250 mg total) by mouth at bedtime. 06/28/15   Shanker Kristeen Mans, MD  docusate sodium (COLACE) 100 MG capsule Take 100 mg by mouth 2 (two) times daily.    Historical Provider, MD  escitalopram (LEXAPRO) 10 MG tablet Take 1 tablet (10 mg total) by mouth daily. 06/28/15   Shanker Kristeen Mans, MD  esomeprazole (NEXIUM) 20 MG capsule Take 20 mg by mouth daily at 12 noon.    Historical Provider, MD  famotidine (PEPCID) 20 MG tablet Take 20 mg by mouth daily.     Historical Provider, MD  ferrous sulfate 325 (65 FE) MG tablet Take 325 mg by mouth daily.    Historical Provider, MD  furosemide (LASIX) 20 MG tablet Take 3 tablets (60 mg total) daily. 05/04/16   Evans Lance, MD  LORazepam (ATIVAN) 0.5 MG tablet Take one tablet by mouth twice daily for anxiety 07/16/15   Lauree Chandler, NP  NITROSTAT 0.4 MG SL tablet Place 0.4 mg under the tongue every 5 (five) minutes as needed for chest pain (MAX 3 TABLETS).  12/05/12   Historical Provider, MD  oxyCODONE (OXY IR/ROXICODONE) 5 MG immediate release tablet Take 1 tablet (5 mg total) by mouth every 6 (six) hours as needed for moderate pain. 06/28/15   Shanker Kristeen Mans, MD  OXYGEN Inhale 3 L into the lungs daily as needed (shortness of breath).    Historical Provider, MD  PATADAY 0.2 % SOLN Place 1 drop into both eyes daily.  01/14/14   Historical  Provider, MD  potassium chloride SA (K-DUR,KLOR-CON) 20 MEQ tablet Take 1 tablet (20 mEq total) by mouth daily. 05/25/16   Evans Lance, MD  RA ASPIRIN ADULT LOW STRENGTH 81 MG EC tablet Take 1 tablet by mouth daily. 09/01/14   Historical Provider, MD  sucralfate (CARAFATE) 1 G tablet Take 1 g by mouth 2 (two) times daily.     Historical Provider, MD  tiZANidine (ZANAFLEX) 2 MG tablet Take 1 tablet (2 mg total) by mouth every 8 (eight) hours as needed for muscle spasms. 06/18/15   Gareth Morgan, MD    Family History Family History  Problem Relation Age of Onset  . Heart Problems Mother   . Diabetes Mother   . Heart Problems Brother     Social History Social History  Substance Use Topics  . Smoking status: Never Smoker  . Smokeless tobacco: Former Systems developer    Types: Loss adjuster, chartered  Comment: no chew in over 3 years  . Alcohol use Yes     Comment: "might take a drink during the holidays"     Allergies   Review of patient's allergies indicates no known allergies.   Review of Systems Review of Systems  Constitutional: Negative.   HENT: Negative.   Respiratory: Positive for shortness of breath.   Cardiovascular: Positive for leg swelling.  Gastrointestinal: Negative.   Musculoskeletal: Negative.   Skin: Negative.   Neurological: Negative.   Psychiatric/Behavioral: Negative.   All other systems reviewed and are negative.    Physical Exam Updated Vital Signs BP 117/71   Pulse 60   Temp 97.4 F (36.3 C) (Oral)   Resp 11   Ht 5\' 4"  (1.626 m)   Wt 156 lb (70.8 kg)   SpO2 98%   BMI 26.78 kg/m   Physical Exam  Constitutional: He appears well-developed and well-nourished.  HENT:  Head: Normocephalic and atraumatic.  Eyes: Conjunctivae are normal. Pupils are equal, round, and reactive to light.  Neck: Neck supple. No tracheal deviation present. No thyromegaly present.  Cardiovascular: Normal rate and regular rhythm.   No murmur heard. Pulmonary/Chest: Effort normal and  breath sounds normal.  Abdominal: Soft. Bowel sounds are normal. He exhibits no distension. There is no tenderness.  Musculoskeletal: Normal range of motion. He exhibits edema. He exhibits no tenderness.  2+ pretibial pitting edema bilaterally  Neurological: He is alert. Coordination normal.  Skin: Skin is warm and dry. No rash noted.  Psychiatric: He has a normal mood and affect.  Nursing note and vitals reviewed.    ED Treatments / Results  Labs (all labs ordered are listed, but only abnormal results are displayed) Labs Reviewed  BASIC METABOLIC PANEL - Abnormal; Notable for the following:       Result Value   Potassium 3.4 (*)    Glucose, Bld 168 (*)    Calcium 8.7 (*)    GFR calc non Af Amer 56 (*)    All other components within normal limits  CBC - Abnormal; Notable for the following:    WBC 2.5 (*)    RBC 2.76 (*)    Hemoglobin 9.4 (*)    HCT 28.3 (*)    MCV 102.5 (*)    MCH 34.1 (*)    RDW 15.6 (*)    Platelets 77 (*)    All other components within normal limits    EKG  EKG Interpretation None       Radiology Dg Chest 2 View  Result Date: 05/31/2016 CLINICAL DATA:  Bilateral lower extremity swelling. EXAM: CHEST  2 VIEW COMPARISON:  June 18, 2015 FINDINGS: Cardiomegaly. AICD device. Small bilateral effusions. No nodules or masses. No focal infiltrates. Mild atelectasis in the right base. IMPRESSION: Cardiomegaly and small effusions.  No overt edema. Electronically Signed   By: Dorise Bullion III M.D   On: 05/31/2016 16:02    Procedures Procedures (including critical care time)  Medications Ordered in ED Medications - No data to display   Initial Impression / Assessment and Plan / ED Course  I have reviewed the triage vital signs and the nursing notes.  Pertinent labs & imaging results that were available during my care of the patient were reviewed by me and considered in my medical decision making (see chart for details).  Clinical Course     Chest x-ray viewed by me. 7 PM patient resting, where after treatment with intravenous Lasix. Patient also received oral potassium  supplement Spoke with Dr.Hochrein plan patient to follow-up with Dr.Bensimon tomorrow Results for orders placed or performed during the hospital encounter of [id number]  Basic metabolic panel  Result Value Ref Range   Sodium 139 135 - 145 mmol/L   Potassium 3.4 (L) 3.5 - 5.1 mmol/L   Chloride 107 101 - 111 mmol/L   CO2 22 22 - 32 mmol/L   Glucose, Bld 168 (H) 65 - 99 mg/dL   BUN 15 6 - 20 mg/dL   Creatinine, Ser 1.13 0.61 - 1.24 mg/dL   Calcium 8.7 (L) 8.9 - 10.3 mg/dL   GFR calc non Af Amer 56 (L) >60 mL/min   GFR calc Af Amer >60 >60 mL/min   Anion gap 10 5 - 15  CBC  Result Value Ref Range   WBC 2.5 (L) 4.0 - 10.5 K/uL   RBC 2.76 (L) 4.22 - 5.81 MIL/uL   Hemoglobin 9.4 (L) 13.0 - 17.0 g/dL   HCT 28.3 (L) 39.0 - 52.0 %   MCV 102.5 (H) 78.0 - 100.0 fL   MCH 34.1 (H) 26.0 - 34.0 pg   MCHC 33.2 30.0 - 36.0 g/dL   RDW 15.6 (H) 11.5 - 15.5 %   Platelets 77 (L) 150 - 400 K/uL  Magnesium  Result Value Ref Range   Magnesium 1.6 (L) 1.7 - 2.4 mg/dL  Brain natriuretic peptide  Result Value Ref Range   B Natriuretic Peptide 2,269.9 (H) 0.0 - 100.0 pg/mL   Dg Chest 2 View  Result Date: 05/31/2016 CLINICAL DATA:  Bilateral lower extremity swelling. EXAM: CHEST  2 VIEW COMPARISON:  June 18, 2015 FINDINGS: Cardiomegaly. AICD device. Small bilateral effusions. No nodules or masses. No focal infiltrates. Mild atelectasis in the right base. IMPRESSION: Cardiomegaly and small effusions.  No overt edema. Electronically Signed   By: Dorise Bullion III M.D   On: 05/31/2016 16:02  AICD Pacemaker was interrogated showing no acute events. Had episode of ventricular tachycardia on 05/17/2016 Final Clinical Impressions(s) / ED Diagnoses  Diagnosis #1 chronic CHF #2 hypokalemia Final diagnoses:  None  #3 hyperglycemia  New Prescriptions New Prescriptions   No  medications on file     Orlie Dakin, MD 06/01/16 0045    Orlie Dakin, MD 06/01/16 [id number]

## 2016-05-31 NOTE — Telephone Encounter (Signed)
Received call from [name] (DPR).  She stated patient visited PCP last week and they wrapped his legs due to the swelling.  Patient was due to follow up with Dr Haroldine Laws tomorrow but Dr [name] called her today and said to take patient to the hospital because BNP is 3853.  She said she wanted to let me know that he would be in the hospital.  Advised would follow up with him after discharge.

## 2016-05-31 NOTE — ED Notes (Signed)
Interrogated medtronic pacemaker, confirmed transmission of interrogation report to [place]. Will contact this RN when report of interrogation is ready.

## 2016-05-31 NOTE — ED Notes (Signed)
Asystole on monitor. Manual HR is 62.

## 2016-05-31 NOTE — ED Notes (Signed)
Called lab. Will add on Mag and BNP

## 2016-05-31 NOTE — Discharge Instructions (Signed)
Keep scheduled appointment tomorrow with Dr.Bensihmon. Return if condition worsens for any reason

## 2016-05-31 NOTE — ED Notes (Signed)
Monitor showing asystole. Pt is alert, oriented x3, manually palpated HR is 60. MD aware

## 2016-05-31 NOTE — ED Triage Notes (Signed)
Pt sent here by PCP, note reads BNP is >3000. Pt reports bilateral leg swelling. Pt also reports shortness of breath.

## 2016-06-01 ENCOUNTER — Inpatient Hospital Stay (HOSPITAL_COMMUNITY): Admission: RE | Admit: 2016-06-01 | Payer: Medicare Other | Source: Ambulatory Visit | Admitting: Internal Medicine

## 2016-06-02 ENCOUNTER — Other Ambulatory Visit: Payer: Self-pay | Admitting: *Deleted

## 2016-06-02 NOTE — Patient Outreach (Signed)
[facility]) Care Management  06/02/2016  [name] 1927-02-09 [id number]   Referral received from telephonic care manager to contact member for home assessment.  According to chart, member has history of congestive heart failure, CABG, A-flutter, GERD, and renal failure.  Call placed to member, identity verified.  This care manager introduced self and purpose of call.  Member request to contact his POA, [name].  Call then placed to [facility].  This care manager introduced self and purpose of call.  [facility] care management services explained.  She reports that she is interested in a home assessment for the member, will plan for next week.  She state that she is not home to review her calendar, but will return call when possible.  She state that the member was seen in the ED for heart failure, but was discharged to follow up with the heart failure clinic.  He missed his appointment yesterday, but is rescheduled for tomorrow.    [name] will follow up after office visit, will plan to make home visit next week.  [name], [place], MSN [place] [phone]

## 2016-06-03 ENCOUNTER — Ambulatory Visit (HOSPITAL_COMMUNITY)
Admission: RE | Admit: 2016-06-03 | Discharge: 2016-06-03 | Disposition: A | Discharge status: Home / Self Care | Payer: Medicare Other | Source: Ambulatory Visit | Attending: Cardiology | Admitting: Cardiology

## 2016-06-03 ENCOUNTER — Other Ambulatory Visit: Payer: Self-pay | Admitting: *Deleted

## 2016-06-03 ENCOUNTER — Encounter (HOSPITAL_COMMUNITY): Payer: Self-pay

## 2016-06-03 VITALS — BP 152/80 | HR 134 | Ht 64.0 in | Wt 151.8 lb

## 2016-06-03 DIAGNOSIS — I4892 Unspecified atrial flutter: Secondary | ICD-10-CM | POA: Insufficient documentation

## 2016-06-03 DIAGNOSIS — I110 Hypertensive heart disease with heart failure: Secondary | ICD-10-CM | POA: Diagnosis not present

## 2016-06-03 DIAGNOSIS — R011 Cardiac murmur, unspecified: Secondary | ICD-10-CM | POA: Diagnosis not present

## 2016-06-03 DIAGNOSIS — I472 Ventricular tachycardia, unspecified: Secondary | ICD-10-CM

## 2016-06-03 DIAGNOSIS — E785 Hyperlipidemia, unspecified: Secondary | ICD-10-CM | POA: Insufficient documentation

## 2016-06-03 DIAGNOSIS — I255 Ischemic cardiomyopathy: Secondary | ICD-10-CM | POA: Insufficient documentation

## 2016-06-03 DIAGNOSIS — Z7982 Long term (current) use of aspirin: Secondary | ICD-10-CM | POA: Insufficient documentation

## 2016-06-03 DIAGNOSIS — I2510 Atherosclerotic heart disease of native coronary artery without angina pectoris: Secondary | ICD-10-CM | POA: Insufficient documentation

## 2016-06-03 DIAGNOSIS — Z951 Presence of aortocoronary bypass graft: Secondary | ICD-10-CM | POA: Insufficient documentation

## 2016-06-03 DIAGNOSIS — M109 Gout, unspecified: Secondary | ICD-10-CM | POA: Insufficient documentation

## 2016-06-03 DIAGNOSIS — K219 Gastro-esophageal reflux disease without esophagitis: Secondary | ICD-10-CM | POA: Diagnosis not present

## 2016-06-03 DIAGNOSIS — I5022 Chronic systolic (congestive) heart failure: Secondary | ICD-10-CM | POA: Insufficient documentation

## 2016-06-03 LAB — BASIC METABOLIC PANEL
ANION GAP: 6 (ref 5–15)
BUN: 18 mg/dL (ref 6–20)
CO2: 25 mmol/L (ref 22–32)
Calcium: 9.1 mg/dL (ref 8.9–10.3)
Chloride: 108 mmol/L (ref 101–111)
Creatinine, Ser: 1.2 mg/dL (ref 0.61–1.24)
GFR calc Af Amer: >60 mL/min (ref >60)
GFR calc non Af Amer: 53 mL/min — ABNORMAL LOW (ref >60)
Glucose, Bld: 200 mg/dL — ABNORMAL HIGH (ref 65–99)
POTASSIUM: 4.4 mmol/L (ref 3.5–5.1)
SODIUM: 139 mmol/L (ref 135–145)

## 2016-06-03 MED ORDER — ENTRESTO 24-26 MG PO TABS: 1.0000 | ORAL_TABLET | Freq: Two times a day (BID) | ORAL | 3 refills | Status: DC

## 2016-06-03 MED ORDER — TORSEMIDE 20 MG PO TABS: 80.0000 mg | ORAL_TABLET | Freq: Every day | ORAL | 3 refills | Status: DC

## 2016-06-03 MED ORDER — METOLAZONE 2.5 MG PO TABS: 2.5000 mg | ORAL_TABLET | ORAL | 3 refills | Status: DC

## 2016-06-03 MED ORDER — POTASSIUM CHLORIDE CRYS ER 20 MEQ PO TBCR: EXTENDED_RELEASE_TABLET | ORAL | 3 refills | Status: DC

## 2016-06-03 NOTE — Patient Outreach (Signed)
[facility]) Care Management  06/03/2016  [name] Oct 20, 1926 [id number]   Call received from [facility] [place], confirming home visit for next week.  She is advised to contact this care manager should anything change.  Valente David, [place], MSN [place] [phone]

## 2016-06-03 NOTE — Patient Instructions (Signed)
Stop Furosemide (Lasix)  Start Torsemide 80 mg (4 tabs) daily, STARTING Saturday 9/30  Hold Metolazone until Monday 10/3, then start taking it every Monday 30 minutes prior to your Torsemide dose  Increase Entresto to 24/26 mg (1 tab) daily  Increase Potassium to 40 meq (2 tabs) in AM and 20 meq (1 tab) in PM  Labs today  Your physician has requested that you have an echocardiogram. Echocardiography is a painless test that uses sound waves to create images of your heart. It provides your doctor with information about the size and shape of your heart and how well your heart's chambers and valves are working. This procedure takes approximately one hour. There are no restrictions for this procedure.  Your physician recommends that you schedule a follow-up appointment in: 1 weeks

## 2016-06-04 NOTE — Progress Notes (Signed)
PCP: Dr. Ardeth Perfect HF Cardiology: Dr. Aundra Dubin  82 yo with CAD s/p CABG and chronic systolic CHF from ischemic cardiomyopathy presents for HF clinic evaluation.  He has a Medtronic CRT-D device.  Echo in 11/15 showed EF 20-25%.  He has become gradually more symptomatic over the last 2 months.  He walks with a cane or walker and can only get about 30-40 feet before getting short of breath.  He is very fatigued . +Orthopnea, sleeps in a recliner.  He had VT x 2 earlier in the month terminated by ATP.  BP is actually elevated today.  He saw his PCP earlier this week and was started on metolazone 2.5 mg daily.  He has been taking metolazone + Lasix 80 mg daily since Monday.  Fluid seems to have gone down some.  He was told to the ER by his PCP after BNP returned markedly dilated.  He was sent home from there and told to followup with cardiology.   ECG: a-paced, BiV paced.   Optivol: Fluid index > threshold x 3 months, active about 15 minutes/day, 99% BiV paced  Labs (9/17): creatinine 1.13, K 3.4, hgb 9.4, BNP 2230  PMH: 1. Atrial flutter: Paroxysmal. 2. PVCs, h/o VT.  3. Chronic systolic CHF: Ischemic cardiomyopathy.  Medtronic CRT-D.   - Echo (11/15): EF 20-25%, mildly dilated LV.   4. H/o SBO 5. HTN 6. Gout  7. Hyperlipidemia 8. GERD 9. CAD: s/p CABG in 5/12 with LIMA-LAD, SVG-ramus, SVG-D1, SVG-PDA. - PCI to OM1 and proximal LCx in 8/12.   SH: Nonsmoker, lives in apartment, no ETOH.    Family History  Problem Relation Age of Onset  . Heart Problems Mother   . Diabetes Mother   . Heart Problems Brother    ROS: All systems reviewed and negative except as per HPI.   Current Outpatient Prescriptions  Medication Sig Dispense Refill  . acetaminophen (TYLENOL) 500 MG tablet Take 2 tablets (1,000 mg total) by mouth 3 (three) times daily. (Patient taking differently: Take 1,000 mg by mouth 2 (two) times daily. )    . acidophilus (RISAQUAD) CAPS capsule Take 1 capsule by mouth daily.    Marland Kitchen  allopurinol (ZYLOPRIM) 300 MG tablet Take 300 mg by mouth daily.      Marland Kitchen atorvastatin (LIPITOR) 20 MG tablet Take 20 mg by mouth daily at 6 PM.    . carvedilol (COREG) 6.25 MG tablet Take 3.125 mg by mouth 2 (two) times daily with a meal.    . divalproex (DEPAKOTE SPRINKLE) 125 MG capsule Take 2 capsules (250 mg total) by mouth at bedtime. 20 capsule 0  . ENTRESTO 24-26 MG Take 1 tablet by mouth 2 (two) times daily. 60 tablet 3  . escitalopram (LEXAPRO) 10 MG tablet Take 1 tablet (10 mg total) by mouth daily.  0  . esomeprazole (NEXIUM) 20 MG capsule Take 20 mg by mouth every morning.     . famotidine (PEPCID) 20 MG tablet Take 20 mg by mouth daily.     Marland Kitchen ketotifen (ZADITOR) 0.025 % ophthalmic solution Place 1 drop into both eyes 2 (two) times daily as needed (dry eyes).    . LORazepam (ATIVAN) 0.5 MG tablet Take one tablet by mouth twice daily for anxiety 60 tablet 0  . metolazone (ZAROXOLYN) 2.5 MG tablet Take 1 tablet (2.5 mg total) by mouth once a week. Every Monday 10 tablet 3  . NITROSTAT 0.4 MG SL tablet Place 0.4 mg under the tongue every 5 (five)  minutes as needed for chest pain (MAX 3 TABLETS).     Marland Kitchen oxyCODONE (OXY IR/ROXICODONE) 5 MG immediate release tablet Take 1 tablet (5 mg total) by mouth every 6 (six) hours as needed for moderate pain. 30 tablet 0  . PATADAY 0.2 % SOLN Place 1 drop into both eyes daily.     . potassium chloride SA (K-DUR,KLOR-CON) 20 MEQ tablet Take 2 tabs in AM and 1 tab in PM 90 tablet 3  . RA ASPIRIN ADULT LOW STRENGTH 81 MG EC tablet Take 1 tablet by mouth daily.  0  . sucralfate (CARAFATE) 1 G tablet Take 0.5 g by mouth 2 (two) times daily.     Marland Kitchen tiZANidine (ZANAFLEX) 2 MG tablet Take 1 tablet (2 mg total) by mouth every 8 (eight) hours as needed for muscle spasms. (Patient taking differently: Take 2 mg by mouth every morning. ) 30 tablet 0  . torsemide (DEMADEX) 20 MG tablet Take 4 tablets (80 mg total) by mouth daily. 120 tablet 3   No current  facility-administered medications for this encounter.    BP (!) 152/80 (BP Location: Left Arm, Patient Position: Sitting, Cuff Size: Normal)   Pulse (!) 134 Comment: irregular  Ht 5\' 4"  (1.626 m)   Wt 151 lb 12.8 oz (68.9 kg)   SpO2 96%   BMI 26.06 kg/m   General: NAD Neck: JVP 14+ cm, no thyromegaly or thyroid nodule.  Lungs: Crackles at bases bilaterally. CV: Nondisplaced PMI.  Heart regular S1/S2, no S3/S4, 3/6 HSM at apex.  2+ edema to knees bilaterally.  No carotid bruit.  Normal pedal pulses.  Abdomen: Soft, nontender, no hepatosplenomegaly, no distention.  Skin: Intact without lesions or rashes.  Neurologic: Alert and oriented x 3.  Psych: Normal affect. Extremities: No clubbing or cyanosis.  HEENT: Normal.   Assessment/Plan: 1. Chronic systolic CHF: Ischemic cardiomyopathy.  He has a Medtronic CRT-D device.  NYHA class IIIb symptoms.  He is volume overloaded by exam and by Optivol.  He has been taking Lasix 80 mg daily + metolazone 2.5 daily (started metolazone Monday) without much effect.  - Stop Lasix.  Start torsemide 80 mg daily.  Hold metolazone for now, change to once weekly on Mondays.   - Increase Entresto to 24/26 bid.  - Continue Crestor.  Add spironolactone next appt. - Increase KCl to 40 qam/20 qpm.   - I will arrange for an echo.  - BMET today and repeat in 1 week.  - He would not be a good candidate for advanced therapies given his age.  2. CAD: s/p CABG.  No chest pain.  - Continue statin and ASA 81 daily.  3. VT: ATP earlier this month was successful.  Prior to starting amiodarone, would work on [place] and getting volume status improved.  If has further VT, may use amiodarone.   4. Murmur: Loud mitral area murmur on exam.  I am getting an echocardiogram.   Loralie Champagne 06/04/2016

## 2016-06-07 ENCOUNTER — Ambulatory Visit (HOSPITAL_BASED_OUTPATIENT_CLINIC_OR_DEPARTMENT_OTHER)
Admission: RE | Admit: 2016-06-07 | Discharge: 2016-06-07 | Disposition: A | Discharge status: Home / Self Care | Payer: Medicare Other | Source: Ambulatory Visit | Attending: Cardiology | Admitting: Cardiology

## 2016-06-07 DIAGNOSIS — Z951 Presence of aortocoronary bypass graft: Secondary | ICD-10-CM | POA: Insufficient documentation

## 2016-06-07 DIAGNOSIS — I5022 Chronic systolic (congestive) heart failure: Secondary | ICD-10-CM

## 2016-06-07 NOTE — Progress Notes (Signed)
*  PRELIMINARY RESULTS* Echocardiogram 2D Echocardiogram has been performed.  Leavy Cella 06/07/2016, 12:26 PM

## 2016-06-08 ENCOUNTER — Telehealth: Payer: Self-pay

## 2016-06-08 ENCOUNTER — Other Ambulatory Visit: Payer: Medicare Other | Admitting: *Deleted

## 2016-06-08 DIAGNOSIS — I5022 Chronic systolic (congestive) heart failure: Secondary | ICD-10-CM

## 2016-06-08 LAB — BASIC METABOLIC PANEL
BUN: 25 mg/dL (ref 7–25)
CHLORIDE: 100 mmol/L (ref 98–110)
CO2: 28 mmol/L (ref 20–31)
CREATININE: 1.56 mg/dL — AB (ref 0.70–1.11)
Calcium: 9.3 mg/dL (ref 8.6–10.3)
Glucose, Bld: 116 mg/dL — ABNORMAL HIGH (ref 65–99)
POTASSIUM: 4.1 mmol/L (ref 3.5–5.3)
Sodium: 139 mmol/L (ref 135–146)

## 2016-06-08 NOTE — Telephone Encounter (Signed)
Received call from friend [name] [facility]).  She reported they will be getting labs drawn today and has a follow up appointment with Dr Aundra Dubin at [facility] clinic on 06/10/2016.   Advised will reschedule ICM remote transmission to 06/22/2016 since fluid level will be checked at 06/10/2016 appointment.  Reported patient is feeling better since meds have been changed and legs are being wrapped.

## 2016-06-09 ENCOUNTER — Other Ambulatory Visit: Payer: Self-pay | Admitting: *Deleted

## 2016-06-09 ENCOUNTER — Encounter: Payer: Self-pay | Admitting: *Deleted

## 2016-06-09 NOTE — Patient Outreach (Signed)
[facility]) Care Management   06/09/2016  [name] 1927/09/01 [id number]  STOKES RATTIGAN is an 82 y.o. male  Subjective:   Member state that he is "not doing that great, but doing alright."  He report that he has not been feeling his best for several weeks, but does not express any specific concerns.  He does discuss his age in relation to his health, and speaks on feeling down at times due to not having many relatives/friends left living.  He report that he does have his POA, [name], and another friend that visit with him several times a week.  Objective:   Review of Systems  Constitutional: Negative.   HENT: Negative.   Eyes: Negative.   Respiratory: Negative.   Cardiovascular: Negative.   Gastrointestinal: Negative.   Genitourinary: Negative.   Musculoskeletal: Negative.   Skin: Negative.   Neurological: Negative.   Endo/Heme/Allergies: Negative.   Psychiatric/Behavioral: Negative.     Physical Exam  Constitutional: He is oriented to person, place, and time. He appears well-developed and well-nourished.  Neck: Normal range of motion.  Cardiovascular: Normal rate, regular rhythm and normal heart sounds.   Respiratory: Effort normal and breath sounds normal.  GI: Soft. Bowel sounds are normal.  Musculoskeletal: Normal range of motion.  Neurological: He is alert and oriented to person, place, and time.  Skin: Skin is warm and dry.    BP (!) 96/52 (BP Location: Left Arm, Patient Position: Sitting, Cuff Size: Normal) Comment: Asymptomatic  Pulse 60   Resp 18   Ht 1.651 m ('5\' 5"'$ )   Wt 153 lb (69.4 kg)   SpO2 97%   BMI 25.46 kg/m    Encounter Medications:   Outpatient Encounter Prescriptions as of 06/09/2016  Medication Sig  . acetaminophen (TYLENOL) 500 MG tablet Take 2 tablets (1,000 mg total) by mouth 3 (three) times daily. (Patient taking differently: Take 1,000 mg by mouth 2 (two) times daily. )  . acidophilus (RISAQUAD) CAPS capsule Take  1 capsule by mouth daily.  Marland Kitchen allopurinol (ZYLOPRIM) 300 MG tablet Take 300 mg by mouth daily.    Marland Kitchen atorvastatin (LIPITOR) 20 MG tablet Take 20 mg by mouth daily at 6 PM.  . carvedilol (COREG) 6.25 MG tablet Take 3.125 mg by mouth 2 (two) times daily with a meal.  . ENTRESTO 24-26 MG Take 1 tablet by mouth 2 (two) times daily.  Marland Kitchen escitalopram (LEXAPRO) 10 MG tablet Take 1 tablet (10 mg total) by mouth daily.  Marland Kitchen esomeprazole (NEXIUM) 20 MG capsule Take 20 mg by mouth every morning.   . famotidine (PEPCID) 20 MG tablet Take 20 mg by mouth daily.   Marland Kitchen ketotifen (ZADITOR) 0.025 % ophthalmic solution Place 1 drop into both eyes 2 (two) times daily as needed (dry eyes).  . LORazepam (ATIVAN) 0.5 MG tablet Take one tablet by mouth twice daily for anxiety  . metolazone (ZAROXOLYN) 2.5 MG tablet Take 1 tablet (2.5 mg total) by mouth once a week. Every Monday  . PATADAY 0.2 % SOLN Place 1 drop into both eyes daily.   . potassium chloride SA (K-DUR,KLOR-CON) 20 MEQ tablet Take 2 tabs in AM and 1 tab in PM  . RA ASPIRIN ADULT LOW STRENGTH 81 MG EC tablet Take 1 tablet by mouth daily.  Marland Kitchen tiZANidine (ZANAFLEX) 2 MG tablet Take 1 tablet (2 mg total) by mouth every 8 (eight) hours as needed for muscle spasms. (Patient taking differently: Take 2 mg by mouth every morning. )  .  torsemide (DEMADEX) 20 MG tablet Take 4 tablets (80 mg total) by mouth daily.  . divalproex (DEPAKOTE SPRINKLE) 125 MG capsule Take 2 capsules (250 mg total) by mouth at bedtime. (Patient not taking: Reported on 06/09/2016)  . NITROSTAT 0.4 MG SL tablet Place 0.4 mg under the tongue every 5 (five) minutes as needed for chest pain (MAX 3 TABLETS).   Marland Kitchen oxyCODONE (OXY IR/ROXICODONE) 5 MG immediate release tablet Take 1 tablet (5 mg total) by mouth every 6 (six) hours as needed for moderate pain. (Patient not taking: Reported on 06/09/2016)  . sucralfate (CARAFATE) 1 G tablet Take 0.5 g by mouth 2 (two) times daily.    No facility-administered  encounter medications on file as of 06/09/2016.     Functional Status:   In your present state of health, do you have any difficulty performing the following activities: 06/09/2016 06/24/2015  Hearing? N N  Vision? Y N  Difficulty concentrating or making decisions? N N  Walking or climbing stairs? Y N  Dressing or bathing? Y N  Doing errands, shopping? Y N  Preparing Food and eating ? Y -  Using the Toilet? N -  In the past six months, have you accidently leaked urine? Y -  Do you have problems with loss of bowel control? N -  Managing your Medications? Y -  Managing your Finances? Y -  Housekeeping or managing your Housekeeping? Y -  Some recent data might be hidden    Fall/Depression Screening:    PHQ 2/9 Scores 06/09/2016 05/27/2016  PHQ - 2 Score 1 -  Exception Documentation - Other- indicate reason in comment box  Not completed - screening call completed with caregiver   Fall Risk  06/09/2016 05/27/2016  Falls in the past year? Yes No  Number falls in past yr: 1 -  Injury with Fall? Yes -  Risk for fall due to : History of fall(s) -    Assessment:    Met with member and POA, [name].  [facility] care management services again explained, consent obtained.  Ms. Izola Price report that she provides a lot of support for member as he lives alone.  She manages his housekeeping, finances, medications, and transportation to medical appointments.  She state that the member is noncompliant with some things concerning his health, such as weights, but he is mostly compliant with his medications.  She reports that she advises him of MD recommendations (such as diet & exercise), but allows him control of what he is willing to do.  Discussed with both POA and member the plan of care in the future when/if member is no longer able to live alone.  He state that he has not thought of that.  Encouraged to begin discussion about living arrangements in the future.  Member has scale in the home, but does  not use.  He report weight being 153 pounds, but his home scale shows 125 pounds.  He does not feel that the scale is reliable and would prefer to recheck at MD visit tomorrow.  Ms. Izola Price state she will help member purchase a more reliable one.  She is also made aware to member's low BP today, member denies symptoms.  Advised to increase fluid intake as he has been taking Torsemide and Metolazone to treat heart failure.  She will discuss with physician at heart failure clinic tomorrow.  Both member and POA deny any further concerns.  Provided them both with contact information, advised to contact with questions.  Plan:   Will follow up next week regarding tomorrow's heart failure appointment. Will follow up next month with routine home visit.  THN CM Care Plan Problem One   Flowsheet Row Most Recent Value  Care Plan Problem One  Knowledge deficit related to heart failure management as evidenced by recent ED visit  Role Documenting the Problem One  Care Management [place] for Problem One  Active  THN Long Term Goal (31-90 days)  Member/POA will be able to verbalize heart failure action plan and when to contact MD within the next 31 days  THN Long Term Goal Start Date  06/09/16  Interventions for Problem One Long Term Goal  Provided member with [facility] calendar tool book, to include action plan for heart failure.  Educated member and POA using teachback on signs/symptoms of heart failure and heart failure zones  THN CM Short Term Goal #1 (0-30 days)  Member will have reliable working scale within the next 2 weeks  THN CM Short Term Goal #1 Start Date  06/09/16  Interventions for Short Term Goal #1  Educated member and POA the importance of having a reliable scale to monitor daily weights.    THN CM Short Term Goal #2 (0-30 days)  Member increase frequency of self monitoring weights to 3-5 days a week within the next 4 weeks  THN CM Short Term Goal #2 Start Date  06/09/16      Valente David, RN, MSN [place] Manager [phone]

## 2016-06-10 ENCOUNTER — Encounter (HOSPITAL_COMMUNITY): Payer: Self-pay

## 2016-06-10 ENCOUNTER — Ambulatory Visit (HOSPITAL_COMMUNITY)
Admission: RE | Admit: 2016-06-10 | Discharge: 2016-06-10 | Disposition: A | Discharge status: Home / Self Care | Payer: Medicare Other | Source: Ambulatory Visit | Attending: Cardiology | Admitting: Cardiology

## 2016-06-10 ENCOUNTER — Inpatient Hospital Stay (HOSPITAL_COMMUNITY)
Admission: AD | Admit: 2016-06-10 | Discharge: 2016-06-15 | DRG: 641 | Disposition: A | Discharge status: Home / Self Care | Payer: Medicare Other | Source: Ambulatory Visit | Attending: Cardiology | Admitting: Cardiology

## 2016-06-10 VITALS — BP 82/46 | HR 56 | Ht 65.0 in | Wt 127.8 lb

## 2016-06-10 DIAGNOSIS — M109 Gout, unspecified: Secondary | ICD-10-CM | POA: Diagnosis present

## 2016-06-10 DIAGNOSIS — Z833 Family history of diabetes mellitus: Secondary | ICD-10-CM | POA: Diagnosis not present

## 2016-06-10 DIAGNOSIS — Z8249 Family history of ischemic heart disease and other diseases of the circulatory system: Secondary | ICD-10-CM | POA: Diagnosis not present

## 2016-06-10 DIAGNOSIS — I472 Ventricular tachycardia, unspecified: Secondary | ICD-10-CM

## 2016-06-10 DIAGNOSIS — I130 Hypertensive heart and chronic kidney disease with heart failure and stage 1 through stage 4 chronic kidney disease, or unspecified chronic kidney disease: Secondary | ICD-10-CM | POA: Diagnosis present

## 2016-06-10 DIAGNOSIS — R531 Weakness: Secondary | ICD-10-CM | POA: Diagnosis present

## 2016-06-10 DIAGNOSIS — I5022 Chronic systolic (congestive) heart failure: Secondary | ICD-10-CM

## 2016-06-10 DIAGNOSIS — Z951 Presence of aortocoronary bypass graft: Secondary | ICD-10-CM

## 2016-06-10 DIAGNOSIS — Z79899 Other long term (current) drug therapy: Secondary | ICD-10-CM | POA: Diagnosis not present

## 2016-06-10 DIAGNOSIS — I951 Orthostatic hypotension: Secondary | ICD-10-CM | POA: Diagnosis present

## 2016-06-10 DIAGNOSIS — K219 Gastro-esophageal reflux disease without esophagitis: Secondary | ICD-10-CM | POA: Diagnosis present

## 2016-06-10 DIAGNOSIS — R634 Abnormal weight loss: Secondary | ICD-10-CM | POA: Diagnosis present

## 2016-06-10 DIAGNOSIS — N183 Chronic kidney disease, stage 3 (moderate): Secondary | ICD-10-CM | POA: Diagnosis present

## 2016-06-10 DIAGNOSIS — E860 Dehydration: Secondary | ICD-10-CM | POA: Diagnosis present

## 2016-06-10 DIAGNOSIS — N179 Acute kidney failure, unspecified: Secondary | ICD-10-CM | POA: Diagnosis present

## 2016-06-10 DIAGNOSIS — I4892 Unspecified atrial flutter: Secondary | ICD-10-CM | POA: Diagnosis present

## 2016-06-10 DIAGNOSIS — I071 Rheumatic tricuspid insufficiency: Secondary | ICD-10-CM | POA: Diagnosis present

## 2016-06-10 DIAGNOSIS — E785 Hyperlipidemia, unspecified: Secondary | ICD-10-CM | POA: Diagnosis present

## 2016-06-10 DIAGNOSIS — I2510 Atherosclerotic heart disease of native coronary artery without angina pectoris: Secondary | ICD-10-CM | POA: Diagnosis present

## 2016-06-10 DIAGNOSIS — I255 Ischemic cardiomyopathy: Secondary | ICD-10-CM | POA: Diagnosis present

## 2016-06-10 DIAGNOSIS — I5023 Acute on chronic systolic (congestive) heart failure: Secondary | ICD-10-CM | POA: Diagnosis not present

## 2016-06-10 DIAGNOSIS — I509 Heart failure, unspecified: Secondary | ICD-10-CM

## 2016-06-10 LAB — CBC
HCT: 34 % — ABNORMAL LOW (ref 39.0–52.0)
Hemoglobin: 11.1 g/dL — ABNORMAL LOW (ref 13.0–17.0)
MCH: 33.5 pg (ref 26.0–34.0)
MCHC: 32.6 g/dL (ref 30.0–36.0)
MCV: 102.7 fL — ABNORMAL HIGH (ref 78.0–100.0)
PLATELETS: 120 10*3/uL — AB (ref 150–400)
RBC: 3.31 MIL/uL — ABNORMAL LOW (ref 4.22–5.81)
RDW: 14.1 % (ref 11.5–15.5)
WBC: 3.6 10*3/uL — AB (ref 4.0–10.5)

## 2016-06-10 LAB — CBC WITH DIFFERENTIAL/PLATELET
BASOS PCT: 0 %
Basophils Absolute: 0 10*3/uL (ref 0.0–0.1)
EOS ABS: 0.1 10*3/uL (ref 0.0–0.7)
Eosinophils Relative: 3 %
HCT: 35.7 % — ABNORMAL LOW (ref 39.0–52.0)
Hemoglobin: 11.7 g/dL — ABNORMAL LOW (ref 13.0–17.0)
Lymphocytes Relative: 20 %
Lymphs Abs: 0.8 10*3/uL (ref 0.7–4.0)
MCH: 33.7 pg (ref 26.0–34.0)
MCHC: 32.8 g/dL (ref 30.0–36.0)
MCV: 102.9 fL — ABNORMAL HIGH (ref 78.0–100.0)
MONO ABS: 0.3 10*3/uL (ref 0.1–1.0)
MONOS PCT: 9 %
Neutro Abs: 2.6 10*3/uL (ref 1.7–7.7)
Neutrophils Relative %: 68 %
Platelets: 130 10*3/uL — ABNORMAL LOW (ref 150–400)
RBC: 3.47 MIL/uL — ABNORMAL LOW (ref 4.22–5.81)
RDW: 14.1 % (ref 11.5–15.5)
WBC: 3.8 10*3/uL — ABNORMAL LOW (ref 4.0–10.5)

## 2016-06-10 LAB — BRAIN NATRIURETIC PEPTIDE
B NATRIURETIC PEPTIDE 5: 652.9 pg/mL — AB (ref 0.0–100.0)
B Natriuretic Peptide: 796.2 pg/mL — ABNORMAL HIGH (ref 0.0–100.0)

## 2016-06-10 LAB — COMPREHENSIVE METABOLIC PANEL
ALK PHOS: 108 U/L (ref 38–126)
ALT: 20 U/L (ref 17–63)
AST: 32 U/L (ref 15–41)
Albumin: 3.4 g/dL — ABNORMAL LOW (ref 3.5–5.0)
Anion gap: 9 (ref 5–15)
BILIRUBIN TOTAL: 1.3 mg/dL — AB (ref 0.3–1.2)
BUN: 26 mg/dL — AB (ref 6–20)
CO2: 27 mmol/L (ref 22–32)
CREATININE: 1.85 mg/dL — AB (ref 0.61–1.24)
Calcium: 9.1 mg/dL (ref 8.9–10.3)
Chloride: 99 mmol/L — ABNORMAL LOW (ref 101–111)
GFR calc Af Amer: 36 mL/min — ABNORMAL LOW (ref >60)
GFR, EST NON AFRICAN AMERICAN: 31 mL/min — AB (ref >60)
Glucose, Bld: 148 mg/dL — ABNORMAL HIGH (ref 65–99)
Potassium: 4.7 mmol/L (ref 3.5–5.1)
Sodium: 135 mmol/L (ref 135–145)
TOTAL PROTEIN: 6 g/dL — AB (ref 6.5–8.1)

## 2016-06-10 LAB — TROPONIN I
Troponin I: 0.03 ng/mL (ref <0.03)
Troponin I: <0.03 ng/mL (ref <0.03)

## 2016-06-10 LAB — MAGNESIUM: MAGNESIUM: 1.7 mg/dL (ref 1.7–2.4)

## 2016-06-10 MED ORDER — KETOTIFEN FUMARATE 0.025 % OP SOLN: 1.0000 [drp] | Freq: Two times a day (BID) | OPHTHALMIC | Status: DC | PRN

## 2016-06-10 MED ORDER — SODIUM CHLORIDE 0.9% FLUSH: 3.0000 mL | INTRAVENOUS | Status: DC | PRN

## 2016-06-10 MED ORDER — SODIUM CHLORIDE 0.9 % IV SOLN
INTRAVENOUS | Status: DC
  Administered 2016-06-10 – 2016-06-11 (×2): via INTRAVENOUS

## 2016-06-10 MED ORDER — PANTOPRAZOLE SODIUM 40 MG PO TBEC
40.0000 mg | DELAYED_RELEASE_TABLET | Freq: Every day | ORAL | Status: DC
  Administered 2016-06-10 – 2016-06-15 (×6): 40 mg via ORAL
  Filled 2016-06-10 (×6): qty 1

## 2016-06-10 MED ORDER — ESCITALOPRAM OXALATE 10 MG PO TABS
10.0000 mg | ORAL_TABLET | Freq: Every day | ORAL | Status: DC
  Administered 2016-06-10 – 2016-06-15 (×6): 10 mg via ORAL
  Filled 2016-06-10 (×6): qty 1

## 2016-06-10 MED ORDER — OLOPATADINE HCL 0.1 % OP SOLN
1.0000 [drp] | Freq: Two times a day (BID) | OPHTHALMIC | Status: DC
  Administered 2016-06-10 – 2016-06-15 (×10): 1 [drp] via OPHTHALMIC
  Filled 2016-06-10: qty 5

## 2016-06-10 MED ORDER — SODIUM CHLORIDE 0.9% FLUSH
3.0000 mL | Freq: Two times a day (BID) | INTRAVENOUS | Status: DC
  Administered 2016-06-11 – 2016-06-15 (×9): 3 mL via INTRAVENOUS

## 2016-06-10 MED ORDER — TIZANIDINE HCL 4 MG PO TABS
2.0000 mg | ORAL_TABLET | Freq: Three times a day (TID) | ORAL | Status: DC | PRN
Start: 2016-06-10 — End: 2016-06-15

## 2016-06-10 MED ORDER — ENOXAPARIN SODIUM 30 MG/0.3ML ~~LOC~~ SOLN
30.0000 mg | SUBCUTANEOUS | Status: DC
  Administered 2016-06-10 – 2016-06-14 (×5): 30 mg via SUBCUTANEOUS
  Filled 2016-06-10 (×5): qty 0.3

## 2016-06-10 MED ORDER — SUCRALFATE 1 G PO TABS
0.5000 g | ORAL_TABLET | Freq: Two times a day (BID) | ORAL | Status: DC
  Administered 2016-06-10 – 2016-06-15 (×10): 0.5 g via ORAL
  Filled 2016-06-10 (×10): qty 1

## 2016-06-10 MED ORDER — FAMOTIDINE 20 MG PO TABS
20.0000 mg | ORAL_TABLET | Freq: Every day | ORAL | Status: DC
  Administered 2016-06-10 – 2016-06-15 (×6): 20 mg via ORAL
  Filled 2016-06-10 (×6): qty 1

## 2016-06-10 MED ORDER — SODIUM CHLORIDE 0.9 % IV SOLN: 250.0000 mL | INTRAVENOUS | Status: DC | PRN

## 2016-06-10 MED ORDER — ATORVASTATIN CALCIUM 20 MG PO TABS
20.0000 mg | ORAL_TABLET | Freq: Every day | ORAL | Status: DC
  Administered 2016-06-10 – 2016-06-14 (×5): 20 mg via ORAL
  Filled 2016-06-10 (×5): qty 1

## 2016-06-10 MED ORDER — OXYCODONE HCL 5 MG PO TABS
5.0000 mg | ORAL_TABLET | Freq: Four times a day (QID) | ORAL | Status: DC | PRN
  Administered 2016-06-11: 5 mg via ORAL
  Filled 2016-06-10: qty 1

## 2016-06-10 MED ORDER — ACETAMINOPHEN 325 MG PO TABS
650.0000 mg | ORAL_TABLET | ORAL | Status: DC | PRN
  Administered 2016-06-11 – 2016-06-13 (×2): 650 mg via ORAL
  Filled 2016-06-10 (×2): qty 2

## 2016-06-10 MED ORDER — LORAZEPAM 0.5 MG PO TABS
0.5000 mg | ORAL_TABLET | Freq: Two times a day (BID) | ORAL | Status: DC
  Administered 2016-06-10 – 2016-06-15 (×10): 0.5 mg via ORAL
  Filled 2016-06-10 (×10): qty 1

## 2016-06-10 MED ORDER — ONDANSETRON HCL 4 MG/2ML IJ SOLN: 4.0000 mg | Freq: Four times a day (QID) | INTRAMUSCULAR | Status: DC | PRN

## 2016-06-10 MED ORDER — ASPIRIN EC 81 MG PO TBEC
81.0000 mg | DELAYED_RELEASE_TABLET | Freq: Every day | ORAL | Status: DC
  Administered 2016-06-10 – 2016-06-15 (×6): 81 mg via ORAL
  Filled 2016-06-10 (×6): qty 1

## 2016-06-10 MED ORDER — ALLOPURINOL 300 MG PO TABS
300.0000 mg | ORAL_TABLET | Freq: Every day | ORAL | Status: DC
  Administered 2016-06-10 – 2016-06-15 (×6): 300 mg via ORAL
  Filled 2016-06-10 (×6): qty 1

## 2016-06-10 NOTE — Progress Notes (Signed)
PCP: Dr. Ardeth Perfect HF Cardiology: Dr. Aundra Dubin  82 yo with CAD s/p CABG and chronic systolic CHF from ischemic cardiomyopathy presents for HF clinic evaluation.  He has a Medtronic CRT-D device.  Echo in 11/15 showed EF 20-25%.  He became gradually more symptomatic over the last 2 months.  He normally walks with a cane or walker and can only get about 30-40 feet before getting short of breath. He had VT x 2 in 9/17 terminated by ATP.  At last appointment, he had been on daily metolazone + Lasix.  I transitioned him to torsemide 80 mg daily + metolazone only once a week.  Entresto was increased to 24/26 bid from 1/2 tablet 24/26 bid, BP was high at the time.   Today, weight is down 24 lbs.  Initial BP 74/41, repeat 82/46.  His breathing is improved.  No orthopnea/PND.  However, he is very lightheaded and weak.  He fell when his daughter went to pick him up today.  He lives at home alone.    Repeat echo (10/17) showed EF 35-40%, mildly dilated RV with mild to moderately decreased systolic function, severe TR, PASP 48 mmHg.   Optivol: Fluid index now well below threshold with impedance heading up.    Labs (9/17): creatinine 1.13, K 3.4, hgb 9.4, BNP 2230 => 2270 Labs (10/17): K 4.1, creatinine 1.56   PMH: 1. Atrial flutter: Paroxysmal. 2. PVCs, h/o VT.  3. Chronic systolic CHF: Ischemic cardiomyopathy.  Medtronic CRT-D.   - Echo (11/15): EF 20-25%, mildly dilated LV.   - Echo (10/17) showed EF 35-40%, mildly dilated RV with mild to moderately decreased systolic function, severe TR, PASP 48 mmHg. 4. H/o SBO 5. HTN 6. Gout  7. Hyperlipidemia 8. GERD 9. CAD: s/p CABG in 5/12 with LIMA-LAD, SVG-ramus, SVG-D1, SVG-PDA. - PCI to OM1 and proximal LCx in 8/12.   SH: Nonsmoker, lives in apartment, no ETOH.    Family History  Problem Relation Age of Onset  . Heart Problems Mother   . Diabetes Mother   . Heart Problems Brother    ROS: All systems reviewed and negative except as per HPI.    Current Outpatient Prescriptions  Medication Sig Dispense Refill  . acetaminophen (TYLENOL) 500 MG tablet Take 2 tablets (1,000 mg total) by mouth 3 (three) times daily. (Patient taking differently: Take 1,000 mg by mouth 2 (two) times daily. )    . acidophilus (RISAQUAD) CAPS capsule Take 1 capsule by mouth daily.    Marland Kitchen allopurinol (ZYLOPRIM) 300 MG tablet Take 300 mg by mouth daily.      Marland Kitchen atorvastatin (LIPITOR) 20 MG tablet Take 20 mg by mouth daily at 6 PM.    . carvedilol (COREG) 6.25 MG tablet Take 6.25 mg by mouth daily.     Marland Kitchen ENTRESTO 24-26 MG Take 1 tablet by mouth 2 (two) times daily. 60 tablet 3  . escitalopram (LEXAPRO) 10 MG tablet Take 1 tablet (10 mg total) by mouth daily.  0  . esomeprazole (NEXIUM) 20 MG capsule Take 20 mg by mouth every morning.     . famotidine (PEPCID) 20 MG tablet Take 20 mg by mouth daily.     Marland Kitchen ketotifen (ZADITOR) 0.025 % ophthalmic solution Place 1 drop into both eyes 2 (two) times daily as needed (dry eyes).    . LORazepam (ATIVAN) 0.5 MG tablet Take one tablet by mouth twice daily for anxiety 60 tablet 0  . metolazone (ZAROXOLYN) 2.5 MG tablet Take 1 tablet (2.5  mg total) by mouth once a week. Every Monday 10 tablet 3  . NITROSTAT 0.4 MG SL tablet Place 0.4 mg under the tongue every 5 (five) minutes as needed for chest pain (MAX 3 TABLETS).     Marland Kitchen oxyCODONE (OXY IR/ROXICODONE) 5 MG immediate release tablet Take 1 tablet (5 mg total) by mouth every 6 (six) hours as needed for moderate pain. 30 tablet 0  . PATADAY 0.2 % SOLN Place 1 drop into both eyes daily.     . potassium chloride SA (K-DUR,KLOR-CON) 20 MEQ tablet Take 2 tabs in AM and 1 tab in PM 90 tablet 3  . RA ASPIRIN ADULT LOW STRENGTH 81 MG EC tablet Take 1 tablet by mouth daily.  0  . sucralfate (CARAFATE) 1 G tablet Take 0.5 g by mouth 2 (two) times daily.     Marland Kitchen tiZANidine (ZANAFLEX) 2 MG tablet Take 1 tablet (2 mg total) by mouth every 8 (eight) hours as needed for muscle spasms. (Patient  taking differently: Take 2 mg by mouth every morning. ) 30 tablet 0  . torsemide (DEMADEX) 20 MG tablet Take 4 tablets (80 mg total) by mouth daily. 120 tablet 3   No current facility-administered medications for this encounter.    BP (!) 82/46 (BP Location: Left Arm, Patient Position: Sitting, Cuff Size: Normal)   Pulse (!) 56   Ht 5\' 5"  (1.651 m)   Wt 127 lb 12.8 oz (58 kg)   BMI 21.27 kg/m   General: NAD Neck: JVP 7 cm, no thyromegaly or thyroid nodule.  Lungs: Crackles at bases bilaterally. CV: Nondisplaced PMI.  Heart regular S1/S2, no S3/S4, 3/6 HSM at LLSB.  Trace ankle edema (much decreased).  No carotid bruit.  Normal pedal pulses.  Abdomen: Soft, nontender, no hepatosplenomegaly, no distention.  Skin: Intact without lesions or rashes.  Neurologic: Alert and oriented x 3.  Psych: Normal affect. Extremities: No clubbing or cyanosis.  HEENT: Normal.   Assessment/Plan: 1. Chronic systolic CHF: Ischemic cardiomyopathy.  He has a Medtronic CRT-D device.  Repeat echo showed EF up a bit to 35-40%.  Recent marked volume overload but diuresed very well and likely excessively since transitioning from Lasix to torsemide.  BP now low.  Weight down 24 lbs.  Orthostatic.  Optivol confirms marked drop in fluid status.   - He lives alone and is quite lightheaded.  I am scared that he will fall again.  He will need to be admitted.   - Hold torsemide and KCl.  Probably can start back at 60 mg daily on Monday.    - Needs BMET now and will give NS at 75 cc/hr x 500 cc total.  - Hold Entresto for now.  - Hold Coreg for today, probably can start back tomorrow or the next day on Coreg 3.125 mg bid.  - He would not be a good candidate for advanced therapies given his age.  2. CAD: s/p CABG.  No chest pain.  - Continue statin and ASA 81 daily.  3. VT: ATP 9/17 was successful.  No VT since then.   4. Murmur: Severe TR on echo.   5. CKD: BMET today.  Suspect creatinine will be up as he is dehydrated.   Changing meds and giving IVF as above.   Loralie Champagne 06/10/2016

## 2016-06-10 NOTE — Addendum Note (Signed)
Encounter addended by: Scarlette Calico, RN on: 06/10/2016  4:16 PM<BR>    Actions taken: Visit diagnoses modified, Order Entry activity accessed, Diagnosis association updated

## 2016-06-10 NOTE — H&P (Signed)
ADVANCED HEART FAILURE H&P  PCP: Dr. Ardeth Perfect HF Cardiology: Dr. Aundra Dubin  82 yo with CAD s/p CABG and chronic systolic CHF from ischemic cardiomyopathy presents for HF clinic evaluation.  He has a Medtronic CRT-D device.  Echo in 11/15 showed EF 20-25%.  He became gradually more symptomatic over the last 2 months.  He normally walks with a cane or walker and can only get about 30-40 feet before getting short of breath. He had VT x 2 in 9/17 terminated by ATP.  At last appointment, he had been on daily metolazone + Lasix.  I transitioned him to torsemide 80 mg daily + metolazone only once a week.  Entresto was increased to 24/26 bid from 1/2 tablet 24/26 bid, BP was high at the time.   Today, weight is down 24 lbs.  Initial BP 74/41, repeat 82/46.  His breathing is improved.  No orthopnea/PND.  However, he is very lightheaded and weak.  He fell when his daughter went to pick him up today.  He lives at home alone.    Repeat echo (10/17) showed EF 35-40%, mildly dilated RV with mild to moderately decreased systolic function, severe TR, PASP 48 mmHg.   Optivol: Fluid index now well below threshold with impedance heading up.    Labs (9/17): creatinine 1.13, K 3.4, hgb 9.4, BNP 2230 => 2270 Labs (10/17): K 4.1, creatinine 1.56   PMH: 1. Atrial flutter: Paroxysmal. 2. PVCs, h/o VT.  3. Chronic systolic CHF: Ischemic cardiomyopathy.  Medtronic CRT-D.   - Echo (11/15): EF 20-25%, mildly dilated LV.   - Echo (10/17) showed EF 35-40%, mildly dilated RV with mild to moderately decreased systolic function, severe TR, PASP 48 mmHg. 4. H/o SBO 5. HTN 6. Gout  7. Hyperlipidemia 8. GERD 9. CAD: s/p CABG in 5/12 with LIMA-LAD, SVG-ramus, SVG-D1, SVG-PDA. - PCI to OM1 and proximal LCx in 8/12.   SH: Nonsmoker, lives in apartment, no ETOH.         Family History  Problem Relation Age of Onset  . Heart Problems Mother   . Diabetes Mother   . Heart Problems Brother    ROS: All systems  reviewed and negative except as per HPI.         Current Outpatient Prescriptions  Medication Sig Dispense Refill  . acetaminophen (TYLENOL) 500 MG tablet Take 2 tablets (1,000 mg total) by mouth 3 (three) times daily. (Patient taking differently: Take 1,000 mg by mouth 2 (two) times daily. )    . acidophilus (RISAQUAD) CAPS capsule Take 1 capsule by mouth daily.    Marland Kitchen allopurinol (ZYLOPRIM) 300 MG tablet Take 300 mg by mouth daily.      Marland Kitchen atorvastatin (LIPITOR) 20 MG tablet Take 20 mg by mouth daily at 6 PM.    . carvedilol (COREG) 6.25 MG tablet Take 6.25 mg by mouth daily.     Marland Kitchen ENTRESTO 24-26 MG Take 1 tablet by mouth 2 (two) times daily. 60 tablet 3  . escitalopram (LEXAPRO) 10 MG tablet Take 1 tablet (10 mg total) by mouth daily.  0  . esomeprazole (NEXIUM) 20 MG capsule Take 20 mg by mouth every morning.     . famotidine (PEPCID) 20 MG tablet Take 20 mg by mouth daily.     Marland Kitchen ketotifen (ZADITOR) 0.025 % ophthalmic solution Place 1 drop into both eyes 2 (two) times daily as needed (dry eyes).    . LORazepam (ATIVAN) 0.5 MG tablet Take one tablet by mouth twice daily  for anxiety 60 tablet 0  . metolazone (ZAROXOLYN) 2.5 MG tablet Take 1 tablet (2.5 mg total) by mouth once a week. Every Monday 10 tablet 3  . NITROSTAT 0.4 MG SL tablet Place 0.4 mg under the tongue every 5 (five) minutes as needed for chest pain (MAX 3 TABLETS).     Marland Kitchen oxyCODONE (OXY IR/ROXICODONE) 5 MG immediate release tablet Take 1 tablet (5 mg total) by mouth every 6 (six) hours as needed for moderate pain. 30 tablet 0  . PATADAY 0.2 % SOLN Place 1 drop into both eyes daily.     . potassium chloride SA (K-DUR,KLOR-CON) 20 MEQ tablet Take 2 tabs in AM and 1 tab in PM 90 tablet 3  . RA ASPIRIN ADULT LOW STRENGTH 81 MG EC tablet Take 1 tablet by mouth daily.  0  . sucralfate (CARAFATE) 1 G tablet Take 0.5 g by mouth 2 (two) times daily.     Marland Kitchen tiZANidine (ZANAFLEX) 2 MG tablet Take 1 tablet (2 mg  total) by mouth every 8 (eight) hours as needed for muscle spasms. (Patient taking differently: Take 2 mg by mouth every morning. ) 30 tablet 0  . torsemide (DEMADEX) 20 MG tablet Take 4 tablets (80 mg total) by mouth daily. 120 tablet 3   No current facility-administered medications for this encounter.    BP (!) 82/46 (BP Location: Left Arm, Patient Position: Sitting, Cuff Size: Normal)   Pulse (!) 56   Ht 5\' 5"  (1.651 m)   Wt 127 lb 12.8 oz (58 kg)   BMI 21.27 kg/m   General: NAD Neck: JVP 7 cm, no thyromegaly or thyroid nodule.  Lungs: Crackles at bases bilaterally. CV: Nondisplaced PMI.  Heart regular S1/S2, no S3/S4, 3/6 HSM at LLSB.  Trace ankle edema (much decreased).  No carotid bruit.  Normal pedal pulses.  Abdomen: Soft, nontender, no hepatosplenomegaly, no distention.  Skin: Intact without lesions or rashes.  Neurologic: Alert and oriented x 3.  Psych: Normal affect. Extremities: No clubbing or cyanosis.  HEENT: Normal.   Assessment/Plan: 1. Chronic systolic CHF: Ischemic cardiomyopathy.  He has a Medtronic CRT-D device.  Repeat echo showed EF up a bit to 35-40%.  Recent marked volume overload but diuresed very well and likely excessively since transitioning from Lasix to torsemide.  BP now low.  Weight down 24 lbs.  Orthostatic.  Optivol confirms marked drop in fluid status.   - He lives alone and is quite lightheaded.  I am scared that he will fall again.  He will need to be admitted.   - Hold torsemide and KCl.  Probably can start back at 60 mg daily on Monday.    - Needs BMET now and will give NS at 75 cc/hr x 500 cc total.  - Hold Entresto for now.  - Hold Coreg for today, probably can start back tomorrow or the next day on Coreg 3.125 mg bid.  - He would not be a good candidate for advanced therapies given his age.  2. CAD: s/p CABG.  No chest pain.  - Continue statin and ASA 81 daily.  3. VT: ATP 9/17 was successful.  No VT since then.   4. Murmur: Severe TR on  echo.   5. CKD: BMET today.  Suspect creatinine will be up as he is dehydrated.  Changing meds and giving IVF as above.    Raheim Beutler NP-C  4:14 PM  Loralie Champagne 06/10/2016

## 2016-06-10 NOTE — Progress Notes (Signed)
New pt admission from CHF clinic. Pt brought to the floor in stable condition. Vitals taken. Initial Assessment done. All immediate pertinent needs to patient addressed. Patient Guide given to patient. Important safety instructions relating to hospitalization reviewed with patient. Patient verbalized understanding. Will continue to monitor pt.  Clarise Cruz RN

## 2016-06-10 NOTE — Progress Notes (Signed)
22 g IV started in left forearm by Vilinda Blanks, RN at 4:00 pm.    Pt urinated 200 cc of urine

## 2016-06-10 NOTE — Addendum Note (Signed)
Encounter addended by: Scarlette Calico, RN on: 06/10/2016  4:50 PM<BR>    Actions taken: Sign clinical note

## 2016-06-11 DIAGNOSIS — I2510 Atherosclerotic heart disease of native coronary artery without angina pectoris: Secondary | ICD-10-CM

## 2016-06-11 DIAGNOSIS — I5022 Chronic systolic (congestive) heart failure: Secondary | ICD-10-CM

## 2016-06-11 LAB — BASIC METABOLIC PANEL
ANION GAP: 6 (ref 5–15)
BUN: 25 mg/dL — ABNORMAL HIGH (ref 6–20)
CHLORIDE: 103 mmol/L (ref 101–111)
CO2: 30 mmol/L (ref 22–32)
Calcium: 8.8 mg/dL — ABNORMAL LOW (ref 8.9–10.3)
Creatinine, Ser: 1.69 mg/dL — ABNORMAL HIGH (ref 0.61–1.24)
GFR calc non Af Amer: 35 mL/min — ABNORMAL LOW (ref >60)
GFR, EST AFRICAN AMERICAN: 40 mL/min — AB (ref >60)
Glucose, Bld: 122 mg/dL — ABNORMAL HIGH (ref 65–99)
POTASSIUM: 4.4 mmol/L (ref 3.5–5.1)
SODIUM: 139 mmol/L (ref 135–145)

## 2016-06-11 LAB — TROPONIN I

## 2016-06-11 MED ORDER — CARVEDILOL 3.125 MG PO TABS
3.1250 mg | ORAL_TABLET | Freq: Two times a day (BID) | ORAL | Status: DC
  Administered 2016-06-11 – 2016-06-15 (×8): 3.125 mg via ORAL
  Filled 2016-06-11 (×8): qty 1

## 2016-06-11 NOTE — Progress Notes (Addendum)
Subjective: No CP  No SOB  Rare dizzy   Objective: Vitals:   06/10/16 1713 06/10/16 2001 06/10/16 2343 06/11/16 0408  BP: (!) 112/59 (!) 100/55 (!) 108/47 (!) 104/45  Pulse: (!) 59 (!) 59 (!) 58 60  Resp: 18 18 16 16   Temp:  97.5 F (36.4 C) 97.9 F (36.6 C) 98.7 F (37.1 C)  TempSrc:  Oral Oral Oral  SpO2: 100% 100% 99% 100%  Weight: 124 lb 9.6 oz (56.5 kg)   119 lb 11.2 oz (54.3 kg)  Height: 5\' 5"  (1.651 m)      Weight change:   Intake/Output Summary (Last 24 hours) at 06/11/16 0901 Last data filed at 06/11/16 0551  Gross per 24 hour  Intake          1076.25 ml  Output             1250 ml  Net          -173.75 ml    General: Alert, awake, oriented x3, in no acute distress Neck:  JVP is normal Heart: Regular rate and rhythm, without murmurs, rubs, gallops.  Lungs: Clear to auscultation.  No rales or wheezes. Exemities:  No edema.   Neuro: Grossly intact, nonfocal.  Tele SR    Lab Results: Results for orders placed or performed during the hospital encounter of 06/10/16 (from the past 24 hour(s))  CBC WITH DIFFERENTIAL     Status: Abnormal   Collection Time: 06/10/16  7:07 PM  Result Value Ref Range   WBC 3.8 (L) 4.0 - 10.5 K/uL   RBC 3.47 (L) 4.22 - 5.81 MIL/uL   Hemoglobin 11.7 (L) 13.0 - 17.0 g/dL   HCT 35.7 (L) 39.0 - 52.0 %   MCV 102.9 (H) 78.0 - 100.0 fL   MCH 33.7 26.0 - 34.0 pg   MCHC 32.8 30.0 - 36.0 g/dL   RDW 14.1 11.5 - 15.5 %   Platelets 130 (L) 150 - 400 K/uL   Neutrophils Relative % 68 %   Neutro Abs 2.6 1.7 - 7.7 K/uL   Lymphocytes Relative 20 %   Lymphs Abs 0.8 0.7 - 4.0 K/uL   Monocytes Relative 9 %   Monocytes Absolute 0.3 0.1 - 1.0 K/uL   Eosinophils Relative 3 %   Eosinophils Absolute 0.1 0.0 - 0.7 K/uL   Basophils Relative 0 %   Basophils Absolute 0.0 0.0 - 0.1 K/uL  Magnesium     Status: None   Collection Time: 06/10/16  7:07 PM  Result Value Ref Range   Magnesium 1.7 1.7 - 2.4 mg/dL  Brain natriuretic peptide     Status:  Abnormal   Collection Time: 06/10/16  7:07 PM  Result Value Ref Range   B Natriuretic Peptide 652.9 (H) 0.0 - 100.0 pg/mL  Troponin I     Status: Abnormal   Collection Time: 06/10/16  7:07 PM  Result Value Ref Range   Troponin I 0.03 (HH) <0.03 ng/mL  Troponin I     Status: None   Collection Time: 06/10/16 10:28 PM  Result Value Ref Range   Troponin I <0.03 <0.03 ng/mL  Basic metabolic panel     Status: Abnormal   Collection Time: 06/11/16  5:10 AM  Result Value Ref Range   Sodium 139 135 - 145 mmol/L   Potassium 4.4 3.5 - 5.1 mmol/L   Chloride 103 101 - 111 mmol/L   CO2 30 22 - 32 mmol/L   Glucose, Bld 122 (H) 65 -  99 mg/dL   BUN 25 (H) 6 - 20 mg/dL   Creatinine, Ser 1.69 (H) 0.61 - 1.24 mg/dL   Calcium 8.8 (L) 8.9 - 10.3 mg/dL   GFR calc non Af Amer 35 (L) >60 mL/min   GFR calc Af Amer 40 (L) >60 mL/min   Anion gap 6 5 - 15  Troponin I     Status: None   Collection Time: 06/11/16  5:10 AM  Result Value Ref Range   Troponin I <0.03 <0.03 ng/mL    Studies/Results: No results found.  Medications: Reviewed     @PROBHOSP @  1  Chronic systolic CHF  Pt appears to have been overdiuresed  Dizzy on admitt Given 500 CC fluid  Meds held   IV fluids actually given through the night  P Pt feeling better  Minimal dizziness  Will d/c IV fluids  Restart Coreg     2  CAD  No active CP   3  Atrial fultter  Paroxysmal  Remains in SR    LOS: 1 day   Dorris Carnes 06/11/2016, 9:01 AM

## 2016-06-12 LAB — BASIC METABOLIC PANEL
Anion gap: 7 (ref 5–15)
BUN: 29 mg/dL — AB (ref 6–20)
CALCIUM: 8.5 mg/dL — AB (ref 8.9–10.3)
CHLORIDE: 100 mmol/L — AB (ref 101–111)
CO2: 29 mmol/L (ref 22–32)
CREATININE: 1.95 mg/dL — AB (ref 0.61–1.24)
GFR calc Af Amer: 34 mL/min — ABNORMAL LOW (ref >60)
GFR calc non Af Amer: 29 mL/min — ABNORMAL LOW (ref >60)
Glucose, Bld: 166 mg/dL — ABNORMAL HIGH (ref 65–99)
Potassium: 4.1 mmol/L (ref 3.5–5.1)
SODIUM: 136 mmol/L (ref 135–145)

## 2016-06-12 NOTE — Progress Notes (Signed)
   Subjective: No CP  Breathing OK  NO dizzienss   Objective: Vitals:   06/12/16 0000 06/12/16 0500 06/12/16 0800 06/12/16 1254  BP: (!) 107/52 (!) 105/43  (!) 93/57  Pulse:  (!) 59 60 60  Resp: 18 18  18   Temp: 97.8 F (36.6 C) 98 F (36.7 C)  97.8 F (36.6 C)  TempSrc: Oral Oral  Oral  SpO2: 100% 98%  97%  Weight:  122 lb 9.2 oz (55.6 kg)    Height:       Weight change: -2 lb 0.4 oz (-0.918 kg)  Intake/Output Summary (Last 24 hours) at 06/12/16 1256 Last data filed at 06/12/16 1256  Gross per 24 hour  Intake              580 ml  Output             1325 ml  Net             -745 ml    General: Alert, awake, oriented x3, in no acute distress Neck:  JVP is normal Heart: Regular rate and rhythm, without murmurs, rubs, gallops.  Lungs: Clear to auscultation. Rales at R base  Exemities:  No edema.   Neuro: Grossly intact, nonfocal.  Tele  SR     Lab Results: Results for orders placed or performed during the hospital encounter of 06/10/16 (from the past 24 hour(s))  Basic metabolic panel     Status: Abnormal   Collection Time: 06/12/16  3:25 AM  Result Value Ref Range   Sodium 136 135 - 145 mmol/L   Potassium 4.1 3.5 - 5.1 mmol/L   Chloride 100 (L) 101 - 111 mmol/L   CO2 29 22 - 32 mmol/L   Glucose, Bld 166 (H) 65 - 99 mg/dL   BUN 29 (H) 6 - 20 mg/dL   Creatinine, Ser 1.95 (H) 0.61 - 1.24 mg/dL   Calcium 8.5 (L) 8.9 - 10.3 mg/dL   GFR calc non Af Amer 29 (L) >60 mL/min   GFR calc Af Amer 34 (L) >60 mL/min   Anion gap 7 5 - 15    Studies/Results: No results found.  Medications  Reviewed    @PROBHOSP @  1  Chronic systolic CHF  Volume is up sl on exam   Pt feeling better  Will ad torsemide back at 40 mg  Hold entresto.   Follow    2  CAD  No active CP   3  Atrial flutter   Paroxysmal  Curr in SR    LOS: 2 days   Dorris Carnes 06/12/2016, 12:56 PM

## 2016-06-13 DIAGNOSIS — I5023 Acute on chronic systolic (congestive) heart failure: Secondary | ICD-10-CM

## 2016-06-13 DIAGNOSIS — N179 Acute kidney failure, unspecified: Secondary | ICD-10-CM

## 2016-06-13 LAB — BASIC METABOLIC PANEL
ANION GAP: 8 (ref 5–15)
BUN: 34 mg/dL — ABNORMAL HIGH (ref 6–20)
CHLORIDE: 100 mmol/L — AB (ref 101–111)
CO2: 27 mmol/L (ref 22–32)
Calcium: 8.7 mg/dL — ABNORMAL LOW (ref 8.9–10.3)
Creatinine, Ser: 1.63 mg/dL — ABNORMAL HIGH (ref 0.61–1.24)
GFR calc non Af Amer: 36 mL/min — ABNORMAL LOW (ref >60)
GFR, EST AFRICAN AMERICAN: 42 mL/min — AB (ref >60)
GLUCOSE: 115 mg/dL — AB (ref 65–99)
Potassium: 3.9 mmol/L (ref 3.5–5.1)
Sodium: 135 mmol/L (ref 135–145)

## 2016-06-13 MED ORDER — POTASSIUM CHLORIDE CRYS ER 20 MEQ PO TBCR
20.0000 meq | EXTENDED_RELEASE_TABLET | Freq: Once | ORAL | Status: AC
  Administered 2016-06-13: 20 meq via ORAL
  Filled 2016-06-13: qty 1

## 2016-06-13 MED ORDER — TORSEMIDE 20 MG PO TABS
40.0000 mg | ORAL_TABLET | Freq: Every day | ORAL | Status: DC
  Administered 2016-06-13 – 2016-06-14 (×2): 40 mg via ORAL
  Filled 2016-06-13 (×2): qty 2

## 2016-06-13 NOTE — Care Management Important Message (Signed)
Important Message  Patient Details  Name: [name] MRN: [id number] Date of Birth: April 01, 1927   Medicare Important Message Given:  Yes    [name] 06/13/2016, 11:17 AM

## 2016-06-13 NOTE — Consult Note (Signed)
   [facility] CM Inpatient Consult   06/13/2016  [name] 27-Apr-1927 [id number]   Patient is currently active with [facility] for chronic disease management services.  Patient has been engaged by a [employer] and CSW.  Our community based plan of care has focused on disease management and community resource support.  Patient will receive a post discharge transition of care call and will be evaluated for monthly home visits for assessments and disease process education.  Made Inpatient Case Manager aware that [facility] following. Of note, [facility] Care Management services does not replace or interfere with any services that are needed or arranged by inpatient case management or social work.  For additional questions or referrals please contact:  Natividad Brood, RN BSN [facility] Liaison  [phone] business mobile phone Toll free office [phone]

## 2016-06-13 NOTE — Progress Notes (Signed)
Advanced Heart Failure Rounding Note  PCP: Dr. Ardeth Perfect HF Cardiology: Dr. Aundra Dubin  Subjective:    Admitted 10/6 with orthostasis and weight loss. Fluid level low by optivol.   Feeling better. States he has walked around without dizziness.    Creatinine 1.85 -> 1.69 -> 1.95 -> 1.63  Objective:   Weight Range: 119 lb 7.8 oz (54.2 kg) Body mass index is 19.88 kg/m.   Vital Signs:   Temp:  [97.8 F (36.6 C)-98.3 F (36.8 C)] 98.3 F (36.8 C) (10/09 0535) Pulse Rate:  [59-60] 60 (10/09 0536) Resp:  [18] 18 (10/09 0535) BP: (93-112)/(48-57) 109/50 (10/09 0535) SpO2:  [97 %-98 %] 98 % (10/09 0535) Weight:  [119 lb 7.8 oz (54.2 kg)] 119 lb 7.8 oz (54.2 kg) (10/09 0535) Last BM Date: 06/13/16  Weight change: Filed Weights   06/11/16 0408 06/12/16 0500 06/13/16 0535  Weight: 119 lb 11.2 oz (54.3 kg) 122 lb 9.2 oz (55.6 kg) 119 lb 7.8 oz (54.2 kg)    Intake/Output:   Intake/Output Summary (Last 24 hours) at 06/13/16 1039 Last data filed at 06/13/16 0838  Gross per 24 hour  Intake              800 ml  Output             1150 ml  Net             -350 ml     Physical Exam: General:  Elderly appearing.  HEENT: Normal Neck: Supple. JVP elevated (has severe TR) 9 with prominent CV waves. Carotids 2+ bilat; no bruits. No lymphadenopathy or thyromegaly appreciated. Cor: PMI nondisplaced. Regular rate & rhythm. No rubs, gallops or murmurs. Lungs: Clear Abdomen: Soft, NT, ND, no HSM. + hernia  No bruits or masses. +BS  Extremities: No cyanosis, clubbing, rash. Trace to 1+ edema Neuro: Alert & orientedx3, cranial nerves grossly intact. moves all 4 extremities w/o difficulty. Affect pleasant  Telemetry: Reviewed, AV dual pacing 60s  Labs: CBC  Recent Labs  06/10/16 1618 06/10/16 1907  WBC 3.6* 3.8*  NEUTROABS  --  2.6  HGB 11.1* 11.7*  HCT 34.0* 35.7*  MCV 102.7* 102.9*  PLT 120* [id number]   Basic Metabolic Panel  Recent Labs  06/10/16 1907  06/12/16 0325  06/13/16 0502  NA  --   < > 136 135  K  --   < > 4.1 3.9  CL  --   < > 100* 100*  CO2  --   < > 29 27  GLUCOSE  --   < > 166* 115*  BUN  --   < > 29* 34*  CREATININE  --   < > 1.95* 1.63*  CALCIUM  --   < > 8.5* 8.7*  MG 1.7  --   --   --   < > = values in this interval not displayed. Liver Function Tests  Recent Labs  06/10/16 1618  AST 32  ALT 20  ALKPHOS 108  BILITOT 1.3*  PROT 6.0*  ALBUMIN 3.4*   No results for input(s): LIPASE, AMYLASE in the last 72 hours. Cardiac Enzymes  Recent Labs  06/10/16 1907 06/10/16 2228 06/11/16 0510  TROPONINI 0.03* <0.03 <0.03    BNP: BNP (last 3 results)  Recent Labs  05/31/16 1523 06/10/16 1616 06/10/16 1907  BNP 2,269.9* 796.2* 652.9*    ProBNP (last 3 results) No results for input(s): PROBNP in the last 8760 hours.   D-Dimer No  results for input(s): DDIMER in the last 72 hours. Hemoglobin A1C No results for input(s): HGBA1C in the last 72 hours. Fasting Lipid Panel No results for input(s): CHOL, HDL, LDLCALC, TRIG, CHOLHDL, LDLDIRECT in the last 72 hours. Thyroid Function Tests No results for input(s): TSH, T4TOTAL, T3FREE, THYROIDAB in the last 72 hours.  Invalid input(s): FREET3  Other results:     Imaging/Studies:   No results found.  Latest Echo  Latest Cath   Medications:     Scheduled Medications: . allopurinol  300 mg Oral Daily  . aspirin EC  81 mg Oral Daily  . atorvastatin  20 mg Oral q1800  . carvedilol  3.125 mg Oral BID WC  . enoxaparin (LOVENOX) injection  30 mg Subcutaneous Q24H  . escitalopram  10 mg Oral Daily  . famotidine  20 mg Oral Daily  . LORazepam  0.5 mg Oral BID  . olopatadine  1 drop Both Eyes BID  . pantoprazole  40 mg Oral Daily  . sodium chloride flush  3 mL Intravenous Q12H  . sucralfate  0.5 g Oral BID     Infusions:     PRN Medications:  sodium chloride, acetaminophen, ketotifen, ondansetron (ZOFRAN) IV, oxyCODONE, sodium chloride flush,  tiZANidine   Assessment   1. Volume depletion 2. Chronic systolic CHF 3. CAD s/p CABG 4. VT s/p ATP 05/2016 by interrogation. Non since. 5. TR - Severe by Echo 6. CKD stage 3  Plan    Echo (06/07/16) showed EF 35-40%, mildly dilated RV with mild to moderately decreased systolic function, severe TR, PASP 48 mmHg.  Dizziness resolved. Has some JVP with severe TR. Difficult to judge volume status. Weight down further from admission without diuretics. ? Accuracy.   Will have PT see.   -Resume torsemide 40 mg daily. No metolazone for now. Will also keep off Entresto for now with dizziness/lightheadedness/hypotension  Possibly home tomorrow.   Length of Stay: 3  Shirley Friar PA-C 06/13/2016, 10:39 AM  Advanced Heart Failure Team Pager [phone] (M-F; 7a - 4p)  Please contact [facility] for night-coverage after hours (4p -7a ) and weekends on [email or web address]  Patient seen and examined with Oda Kilts, PA-C. We discussed all aspects of the encounter. I agree with the assessment and plan as stated above.   Overall improved. Renal function getting better.Orthostasis resolved. Volume status up slightly. Will resume po torsemide carefully. Agree with holding Entresto. Can stop b-blocker as needed. PT to see.   Earlean Fidalgo,MD 11:41 PM

## 2016-06-13 NOTE — Progress Notes (Signed)
Paged for 28 beat NSVT. Asymptomatic. K stable this am.    Will give 20 meq K as he is getting back on torsemide, and will check Mg in am.   Discussed with MD.    Lollie Marrow, PA-C 06/13/2016 3:50 PM

## 2016-06-13 NOTE — Evaluation (Signed)
Physical Therapy Evaluation Patient Details Name: [name] MRN: [id number] DOB: 1927-02-09 Today's Date: 06/13/2016   History of Present Illness  82 yo with CAD s/p CABG and chronic systolic CHF from ischemic cardiomyopathy with EF 20%. He fell at home and found to be orthostatic (CHF meds recently adjusted)    Clinical Impression  Pt admitted with above diagnosis. Patient moviing very well with the limited activity he has had in past couple of days. He is very cautious and demonstrated good use of his RW while walking. He receives mobile meals and has friends and his PO that check on him each day. Anticipate patient will do well in his home enviroment. Pt currently with functional limitations due to the deficits listed below (see PT Problem List).  Pt will benefit from skilled PT to increase their independence and safety with mobility to allow discharge to the venue listed below.       Follow Up Recommendations Home health PT;Supervision - Intermittent (home safety eval)    Equipment Recommendations  None recommended by PT    Recommendations for Other Services       Precautions / Restrictions Precautions Precautions: Fall      Mobility  Bed Mobility Overal bed mobility: Independent                Transfers Overall transfer level: Needs assistance Equipment used: Rolling walker (2 wheeled) Transfers: Sit to/from Stand Sit to Stand: Min guard         General transfer comment: first attempt he nearly was standing and lost his balance posteriorly and returned to sitting. x4 other transfers with no loss of balance; vc for proper hand placement  Ambulation/Gait Ambulation/Gait assistance: Min guard;Supervision Ambulation Distance (Feet): 150 Feet Assistive device: Rolling walker (2 wheeled) Gait Pattern/deviations: Step-through pattern;Decreased stride length     General Gait Details: slow and cautious  Stairs            Wheelchair Mobility     Modified Rankin (Stroke Patients Only)       Balance Overall balance assessment: Needs assistance;History of Falls Sitting-balance support: No upper extremity supported;Feet supported Sitting balance-Leahy Scale: Good     Standing balance support: No upper extremity supported Standing balance-Leahy Scale: Fair                               Pertinent Vitals/Pain HR 66-74 bpm SaO2 96% on room air  Pain Assessment: No/denies pain    Home Living Family/patient expects to be discharged to:: Private residence Living Arrangements: Alone Available Help at Discharge: Friend(s);Available PRN/intermittently Type of Home: Apartment Home Access: Level entry     Home Layout: One level Home Equipment: Walker - 2 wheels;Shower seat;Hand held shower head      Prior Function Level of Independence: Needs assistance   Gait / Transfers Assistance Needed: uses RW at all times; up to bathroom at nights "very carefully"  ADL's / Homemaking Assistance Needed: mobile meals; independent with ADLs        Hand Dominance        Extremity/Trunk Assessment   Upper Extremity Assessment: Generalized weakness           Lower Extremity Assessment: Generalized weakness      Cervical / Trunk Assessment: Kyphotic  Communication   Communication: No difficulties  Cognition Arousal/Alertness: Awake/alert Behavior During Therapy: WFL for tasks assessed/performed Overall Cognitive Status: Within Functional Limits for tasks assessed  General Comments      Exercises     Assessment/Plan    PT Assessment Patient needs continued PT services  PT Problem List Decreased strength;Decreased balance;Decreased mobility;Decreased knowledge of use of DME          PT Treatment Interventions DME instruction;Gait training;Functional mobility training;Therapeutic activities;Therapeutic exercise;Balance training;Patient/family education    PT Goals  (Current goals can be found in the Care Plan section)  Acute Rehab PT Goals Patient Stated Goal: return home  PT Goal Formulation: With patient Time For Goal Achievement: 06/17/16 Potential to Achieve Goals: Good    Frequency Min 3X/week   Barriers to discharge Decreased caregiver support relies on friends and his "PO"    Co-evaluation               End of Session Equipment Utilized During Treatment: Gait belt Activity Tolerance: Patient tolerated treatment well Patient left: in chair;with call bell/phone within reach;with chair alarm set Nurse Communication: Mobility status;Other (comment) (bed soaked with urine )         Time: 1437-1510 PT Time Calculation (min) (ACUTE ONLY): 33 min   Charges:   PT Evaluation $PT Eval Low Complexity: 1 Procedure PT Treatments $Gait Training: 8-22 mins   PT G Codes:        Elohim Brune 06-24-2016, 3:27 PM Pager [phone]

## 2016-06-13 NOTE — Progress Notes (Signed)
Patient had 28 beats of V Tach. Pt asymptomatic. Vitals WNL. Patient now with paced rhythm. PA notified.

## 2016-06-14 ENCOUNTER — Encounter (HOSPITAL_COMMUNITY): Payer: Self-pay | Admitting: Nurse Practitioner

## 2016-06-14 ENCOUNTER — Other Ambulatory Visit (HOSPITAL_COMMUNITY): Payer: Self-pay | Admitting: Cardiology

## 2016-06-14 ENCOUNTER — Telehealth: Payer: Self-pay

## 2016-06-14 LAB — BASIC METABOLIC PANEL
Anion gap: 10 (ref 5–15)
BUN: 43 mg/dL — AB (ref 6–20)
CHLORIDE: 99 mmol/L — AB (ref 101–111)
CO2: 27 mmol/L (ref 22–32)
Calcium: 8.9 mg/dL (ref 8.9–10.3)
Creatinine, Ser: 1.66 mg/dL — ABNORMAL HIGH (ref 0.61–1.24)
GFR calc Af Amer: 41 mL/min — ABNORMAL LOW (ref >60)
GFR calc non Af Amer: 35 mL/min — ABNORMAL LOW (ref >60)
Glucose, Bld: 149 mg/dL — ABNORMAL HIGH (ref 65–99)
POTASSIUM: 3.9 mmol/L (ref 3.5–5.1)
SODIUM: 136 mmol/L (ref 135–145)

## 2016-06-14 LAB — MAGNESIUM: MAGNESIUM: 1.9 mg/dL (ref 1.7–2.4)

## 2016-06-14 MED ORDER — TORSEMIDE 20 MG PO TABS
20.0000 mg | ORAL_TABLET | Freq: Every day | ORAL | Status: DC
  Administered 2016-06-15: 20 mg via ORAL
  Filled 2016-06-14: qty 1

## 2016-06-14 NOTE — Progress Notes (Signed)
Physical Therapy Treatment Patient Details Name: [name] MRN: [id number] DOB: [birth year] Today's Date: 06-20-2016    History of Present Illness 82 yo with CAD s/p CABG and chronic systolic CHF from ischemic cardiomyopathy with EF 20%. He fell at home and found to be orthostatic (CHF meds recently adjusted)    PT Comments    Patient demonstrated better endurance and balance with mobility today. He demonstrated safe, proper use of RW with transfers and ambulation. He denies having any concerns re: his mobility or other tasks at home. He is hopeful to return home tomorrow.   Follow Up Recommendations  Home health PT (home safety eval); Supervision - Intermittent      Equipment Recommendations  None recommended by PT    Recommendations for Other Services       Precautions / Restrictions Precautions Precautions: Fall    Mobility  Bed Mobility Overal bed mobility: Independent             General bed mobility comments: including managing linens  Transfers Overall transfer level: Needs assistance Equipment used: Rolling walker (2 wheeled) Transfers: Sit to/from Stand Sit to Stand: Supervision         General transfer comment: correct use of RW  Ambulation/Gait Ambulation/Gait assistance: Min guard;Supervision Ambulation Distance (Feet): 150 Feet Assistive device: Rolling walker (2 wheeled) Gait Pattern/deviations: Step-through pattern;Decreased stride length     General Gait Details: slow and cautious   Stairs            Wheelchair Mobility    Modified Rankin (Stroke Patients Only)       Balance     Sitting balance-Leahy Scale: Good     Standing balance support: No upper extremity supported Standing balance-Leahy Scale: Fair                      Cognition Arousal/Alertness: Awake/alert Behavior During Therapy: WFL for tasks assessed/performed Overall Cognitive Status: Within Functional Limits for tasks assessed                       Exercises      General Comments General comments (skin integrity, edema, etc.): Appears stronger and definitely improved steadiness      Pertinent Vitals/Pain HR 80 with walking  Pain Assessment: No/denies pain    Home Living                      Prior Function            PT Goals (current goals can now be found in the care plan section) Acute Rehab PT Goals Patient Stated Goal: return home  Time For Goal Achievement: 06/17/16 Progress towards PT goals: Progressing toward goals    Frequency    Min 3X/week      PT Plan Current plan remains appropriate    Co-evaluation             End of Session Equipment Utilized During Treatment: Gait belt Activity Tolerance: Patient tolerated treatment well Patient left: in chair;with call bell/phone within reach;with chair alarm set     Time: [id number] PT Time Calculation (min) (ACUTE ONLY): 15 min  Charges:  $Gait Training: 8-22 mins                    G Codes:      [name] 2016-06-20, 4:32 PM Pager [phone]

## 2016-06-14 NOTE — Telephone Encounter (Signed)
Received voice mail message from [name] stating patient currently hospitalized and requested a return call to home number.   Call to [name], (DPR).  She stated patient admitted at the time of HF clinic appointment on 06/10/2016 due to fluid loss and patient unstable.  She is unsure when he will be discharged.  Provided [facility] number as she requested.  Advised ICM follow up is scheduled for 06/22/2016.  She stated she would call when he is discharged.

## 2016-06-14 NOTE — Progress Notes (Signed)
Advanced Heart Failure Rounding Note  PCP: Dr. Ardeth Perfect HF Cardiology: Dr. Aundra Dubin  Subjective:    Admitted 10/6 with orthostasis and weight loss. Fluid level low by optivol.   No SOB, but just not "feeling great". Walked with PT with only minimal balance issues.   Creatinine 1.85 -> 1.69 -> 1.95 -> 1.63 -> 1.66  Objective:   Weight Range: 121 lb 11.1 oz (55.2 kg) Body mass index is 20.25 kg/m.   Vital Signs:   Temp:  [97.6 F (36.4 C)-98.5 F (36.9 C)] 98.5 F (36.9 C) (10/10 0513) Pulse Rate:  [61-72] 68 (10/10 0513) Resp:  [18] 18 (10/10 0513) BP: (108-112)/(46-68) 112/53 (10/10 0513) SpO2:  [98 %-99 %] 98 % (10/10 0513) Weight:  [121 lb 11.1 oz (55.2 kg)] 121 lb 11.1 oz (55.2 kg) (10/10 0513) Last BM Date: 06/13/16  Weight change: Filed Weights   06/12/16 0500 06/13/16 0535 06/14/16 0513  Weight: 122 lb 9.2 oz (55.6 kg) 119 lb 7.8 oz (54.2 kg) 121 lb 11.1 oz (55.2 kg)    Intake/Output:   Intake/Output Summary (Last 24 hours) at 06/14/16 1008 Last data filed at 06/14/16 0909  Gross per 24 hour  Intake              240 ml  Output             2085 ml  Net            -1845 ml     Physical Exam: General:  Elderly appearing.  HEENT: Normal Neck: Supple. JVP 8-9 with prominent CV waves. Carotids 2+ bilat; no bruits. No thyromegaly or nodule noted.  Cor: PMI nondisplaced. RRR. No M/G/R Lungs: CTAB, normal effort Abdomen: Soft, NT, ND, no HSM. + hernia  No bruits or masses. +BS  Extremities: No cyanosis, clubbing, rash. Trace to 1+ edema Neuro: Alert & orientedx3, cranial nerves grossly intact. moves all 4 extremities w/o difficulty. Affect pleasant  Telemetry: Reviewed, AV dual pacing 60s   Labs: CBC No results for input(s): WBC, NEUTROABS, HGB, HCT, MCV, PLT in the last 72 hours. Basic Metabolic Panel  Recent Labs  06/13/16 0502 06/14/16 0343  NA 135 136  K 3.9 3.9  CL 100* 99*  CO2 27 27  GLUCOSE 115* 149*  BUN 34* 43*  CREATININE 1.63*  1.66*  CALCIUM 8.7* 8.9  MG  --  1.9   Liver Function Tests No results for input(s): AST, ALT, ALKPHOS, BILITOT, PROT, ALBUMIN in the last 72 hours. No results for input(s): LIPASE, AMYLASE in the last 72 hours. Cardiac Enzymes No results for input(s): CKTOTAL, CKMB, CKMBINDEX, TROPONINI in the last 72 hours.  BNP: BNP (last 3 results)  Recent Labs  05/31/16 1523 06/10/16 1616 06/10/16 1907  BNP 2,269.9* 796.2* 652.9*    ProBNP (last 3 results) No results for input(s): PROBNP in the last 8760 hours.   D-Dimer No results for input(s): DDIMER in the last 72 hours. Hemoglobin A1C No results for input(s): HGBA1C in the last 72 hours. Fasting Lipid Panel No results for input(s): CHOL, HDL, LDLCALC, TRIG, CHOLHDL, LDLDIRECT in the last 72 hours. Thyroid Function Tests No results for input(s): TSH, T4TOTAL, T3FREE, THYROIDAB in the last 72 hours.  Invalid input(s): FREET3  Other results:     Imaging/Studies:  No results found.  Latest Echo  Latest Cath   Medications:     Scheduled Medications: . allopurinol  300 mg Oral Daily  . aspirin EC  81 mg Oral  Daily  . atorvastatin  20 mg Oral q1800  . carvedilol  3.125 mg Oral BID WC  . enoxaparin (LOVENOX) injection  30 mg Subcutaneous Q24H  . escitalopram  10 mg Oral Daily  . famotidine  20 mg Oral Daily  . LORazepam  0.5 mg Oral BID  . olopatadine  1 drop Both Eyes BID  . pantoprazole  40 mg Oral Daily  . sodium chloride flush  3 mL Intravenous Q12H  . sucralfate  0.5 g Oral BID  . torsemide  40 mg Oral Daily    Infusions:    PRN Medications: sodium chloride, acetaminophen, ketotifen, ondansetron (ZOFRAN) IV, oxyCODONE, sodium chloride flush, tiZANidine   Assessment   1. Volume depletion 2. Chronic systolic CHF 3. CAD s/p CABG 4. VT s/p ATP 05/2016 by interrogation. Non since. 5. TR - Severe by Echo 6. CKD stage 3  Plan    Echo (06/07/16) showed EF 35-40%, mildly dilated RV with mild to  moderately decreased systolic function, severe TR, PASP 48 mmHg.  Dizziness improved.    PT see. Recommending HHPT. Will arrange.   JVP still elevated. Give torsemide 40 today, plan transition to 20 mg daily for home tomorrow.    Will watch overnight with continued malaise and with continued attempts to optimize diuretic dosing.   Length of Stay: 4  Shirley Friar PA-C 06/14/2016, 10:08 AM  Advanced Heart Failure Team Pager [phone] (M-F; 7a - 4p)  Please contact [facility] for night-coverage after hours (4p -7a ) and weekends on [email or web address]  Patient seen and examined with Oda Kilts, PA-C. We discussed all aspects of the encounter. I agree with the assessment and plan as stated above.   Volume status minimally elevated. Will give torsemide 40 daily today and hopefully can d/c home tomorrow won 20 mg daily with close f/u in HF clinic.  Laynie Espy,MD 1:39 PM

## 2016-06-15 DIAGNOSIS — I951 Orthostatic hypotension: Secondary | ICD-10-CM

## 2016-06-15 LAB — BASIC METABOLIC PANEL
Anion gap: 10 (ref 5–15)
BUN: 55 mg/dL — ABNORMAL HIGH (ref 6–20)
CHLORIDE: 97 mmol/L — AB (ref 101–111)
CO2: 28 mmol/L (ref 22–32)
Calcium: 9.1 mg/dL (ref 8.9–10.3)
Creatinine, Ser: 1.76 mg/dL — ABNORMAL HIGH (ref 0.61–1.24)
GFR calc non Af Amer: 33 mL/min — ABNORMAL LOW (ref >60)
GFR, EST AFRICAN AMERICAN: 38 mL/min — AB (ref >60)
Glucose, Bld: 121 mg/dL — ABNORMAL HIGH (ref 65–99)
POTASSIUM: 4.2 mmol/L (ref 3.5–5.1)
SODIUM: 135 mmol/L (ref 135–145)

## 2016-06-15 MED ORDER — TORSEMIDE 20 MG PO TABS: 20.0000 mg | ORAL_TABLET | Freq: Every day | ORAL | 6 refills | Status: DC

## 2016-06-15 NOTE — Progress Notes (Signed)
Discharge Note:  Patient alert and oriented X 4 and in no distress.  Patient requested that the RN call [name] who he named as his POA to give the discharge instructions.  She was given instructions regarding signs and symptoms to report, medications, diet, activity, and upcoming appointments.  She verbalized understanding of all instructions. Patient's peripheral IV and telemetry discontinued.  He stated that he ride is here but he will leave after he has bathed and shaved.  When this is done the patient will be transported out by hospital staff.

## 2016-06-15 NOTE — Progress Notes (Signed)
Patient ID: [name], male   DOB: Apr 02, 1927, 82 y.o.   MRN: [id number]     Advanced Heart Failure Rounding Note PCP: Dr. Ardeth Perfect HF Cardiology: Dr. Aundra Dubin  Subjective:    Admitted 10/6 with orthostasis and weight loss. Fluid level low by optivol.   Overall feels better today. Walked with PT with only minimal balance issues. Got torsemide 40 mg yesterday, decreased to 20 mg today.   Creatinine 1.85 -> 1.69 -> 1.95 -> 1.63 -> 1.66 ->1.76  Objective:   Weight Range: 118 lb 2.7 oz (53.6 kg) Body mass index is 19.66 kg/m.   Vital Signs:   Temp:  [97.8 F (36.6 C)-98.3 F (36.8 C)] 98.3 F (36.8 C) (10/11 0500) Pulse Rate:  [48-59] 48 (10/11 0500) Resp:  [18] 18 (10/11 0500) BP: (98-107)/(46-55) 105/50 (10/11 0500) SpO2:  [98 %-100 %] 100 % (10/11 0500) Weight:  [118 lb 2.7 oz (53.6 kg)] 118 lb 2.7 oz (53.6 kg) (10/11 0500) Last BM Date: 06/14/16  Weight change: Filed Weights   06/13/16 0535 06/14/16 0513 06/15/16 0500  Weight: 119 lb 7.8 oz (54.2 kg) 121 lb 11.1 oz (55.2 kg) 118 lb 2.7 oz (53.6 kg)    Intake/Output:   Intake/Output Summary (Last 24 hours) at 06/15/16 0827 Last data filed at 06/15/16 0407  Gross per 24 hour  Intake              360 ml  Output             1820 ml  Net            -1460 ml     Physical Exam: General:  Elderly appearing.  HEENT: Normal Neck: Supple. JVP 8. Carotids 2+ bilat; no bruits. No thyromegaly or nodule noted.  Cor: PMI nondisplaced. RRR. No M/G/R Lungs: CTAB, normal effort Abdomen: Soft, NT, ND, no HSM. + hernia  No bruits or masses. +BS  Extremities: No cyanosis, clubbing, rash. Trace ankle edema. Neuro: Alert & orientedx3, cranial nerves grossly intact. moves all 4 extremities w/o difficulty. Affect pleasant  Telemetry: Reviewed, AV dual pacing 60s   Labs: CBC No results for input(s): WBC, NEUTROABS, HGB, HCT, MCV, PLT in the last 72 hours. Basic Metabolic Panel  Recent Labs  06/14/16 0343 06/15/16 0354  NA  136 135  K 3.9 4.2  CL 99* 97*  CO2 27 28  GLUCOSE 149* 121*  BUN 43* 55*  CREATININE 1.66* 1.76*  CALCIUM 8.9 9.1  MG 1.9  --    Liver Function Tests No results for input(s): AST, ALT, ALKPHOS, BILITOT, PROT, ALBUMIN in the last 72 hours. No results for input(s): LIPASE, AMYLASE in the last 72 hours. Cardiac Enzymes No results for input(s): CKTOTAL, CKMB, CKMBINDEX, TROPONINI in the last 72 hours.  BNP: BNP (last 3 results)  Recent Labs  05/31/16 1523 06/10/16 1616 06/10/16 1907  BNP 2,269.9* 796.2* 652.9*    ProBNP (last 3 results) No results for input(s): PROBNP in the last 8760 hours.   D-Dimer No results for input(s): DDIMER in the last 72 hours. Hemoglobin A1C No results for input(s): HGBA1C in the last 72 hours. Fasting Lipid Panel No results for input(s): CHOL, HDL, LDLCALC, TRIG, CHOLHDL, LDLDIRECT in the last 72 hours. Thyroid Function Tests No results for input(s): TSH, T4TOTAL, T3FREE, THYROIDAB in the last 72 hours.  Invalid input(s): FREET3  Other results:     Imaging/Studies:  No results found.  Latest Echo  Latest Cath   Medications:  Scheduled Medications: . allopurinol  300 mg Oral Daily  . aspirin EC  81 mg Oral Daily  . atorvastatin  20 mg Oral q1800  . carvedilol  3.125 mg Oral BID WC  . enoxaparin (LOVENOX) injection  30 mg Subcutaneous Q24H  . escitalopram  10 mg Oral Daily  . famotidine  20 mg Oral Daily  . LORazepam  0.5 mg Oral BID  . olopatadine  1 drop Both Eyes BID  . pantoprazole  40 mg Oral Daily  . sodium chloride flush  3 mL Intravenous Q12H  . sucralfate  0.5 g Oral BID  . torsemide  20 mg Oral Daily    Infusions:    PRN Medications: sodium chloride, acetaminophen, ketotifen, ondansetron (ZOFRAN) IV, oxyCODONE, sodium chloride flush, tiZANidine   Assessment   1. Volume depletion/orthostatic hypotension. 2. Chronic systolic CHF: Ischemic cardiomyopathy.  3. CAD s/p CABG 4. VT s/p ATP 05/2016 by  interrogation. None since. 5. TR - Severe by Echo 6. CKD stage 3  Plan    Echo (06/07/16) showed EF 35-40%, mildly dilated RV with mild to moderately decreased systolic function, severe TR, PASP 48 mmHg.  Dizziness/orthostasis improved with medication changes.    PT recommending HHPT. Will arrange.   Volume status looks ok.  Got torsemide 40 yesterday, creatinine up a bit.  Will send home on torsemide 20 mg daily.     Plan for home today with home health and PT.  Needs close followup CHF clinic.  Cardiac meds: Coreg 3.125 mg bid, atorvastatin 20 daily, ASA 81 daily, torsemide 20 daily.   Length of Stay: [place]  06/15/2016, 8:27 AM  Advanced Heart Failure Team Pager [phone] (M-F; 7a - 4p)  Please contact [facility] for night-coverage after hours (4p -7a ) and weekends on [email or web address]

## 2016-06-15 NOTE — Care Management Note (Signed)
Case Management Note  Patient Details  Name: [name] MRN: [id number] Date of Birth: 05/06/1927  Subjective/Objective:     Admitted with CHF               Action/Plan: Patient lives alone, his friend [name] takes him to all of his apts and errands. Has medical coverage through [facility] with prescription drug coverage; patient could benefit from [facility] Disease Management Program for CHF. HHC choice offered, pt chose [facility]; Butch Penny with [facility] called for arrangements. Also pt requested that I talk to [name] about [place] needs. VM left with [name].  Expected Discharge Date:  06/15/2016              Expected Discharge Plan:  [place]  Discharge planning Services  CM Consult     Choice offered to:  Patient  HH Arranged:  RN, Disease Management, PT [place] Agency:  [place]  Status of Service:  In process, will continue to follow  Sherrilyn Rist [phone] 06/15/2016, 9:58 AM

## 2016-06-15 NOTE — Discharge Summary (Signed)
Advanced Heart Failure Discharge Note   Discharge Summary   Patient ID: [name] MRN: [id number], DOB/AGE: [birth year] 82 y.o. Admit date: 06/10/2016 D/C date:     06/15/2016   Primary Discharge Diagnoses:   1. Volume depletion/orthostatic hypotension. 2. Chronic systolic CHF: Ischemic cardiomyopathy.  3. CAD s/p CABG 4. VT s/p ATP 05/2016 by interrogation. None since. 5. TR - Severe by Echo 6. AKI on CKD stage 3  Hospital Course:   BYRON SICKEL is a 82 y.o. male with CAD s/p CABG and chronic systolic CHF from ischemic cardiomyopathy admitted from clinic 06/10/16 with orthostasis and 24 lb weight loss after recent switch from lasix to torsemide.  Optivol with fluid index well below threshold and impedance continuing up.  With orthostasis, light headedness, and living alone pt was admitted for dehydration. AKI noted on labwork.   Diuretics held on admission and given IV fluid. Entresto stopped. Dizziness/orthostasis improved over weekend.  Torsemide resumed 06/13/16, then cut back further on 10/10 and again on 06/15/16 for uptrend in BUN.    PT saw and thought pt appropriate for [facility] with PT.  Pt was seen am of 06/15/16 and thought to be stable for discharge. Metolazone and Delene Loll will be held for now with soft BP and orthostasis. He will be followed up very closely in the HF clinic as below with labwork.    Discharge Weight Range: 118 lbs Discharge Vitals: Blood pressure (!) 94/55, pulse 65, temperature 98.1 F (36.7 C), temperature source Oral, resp. rate 18, height 5\' 5"  (1.651 m), weight 118 lb 2.7 oz (53.6 kg), SpO2 98 %.  Labs: Lab Results  Component Value Date   WBC 3.8 (L) 06/10/2016   HGB 11.7 (L) 06/10/2016   HCT 35.7 (L) 06/10/2016   MCV 102.9 (H) 06/10/2016   PLT 130 (L) 06/10/2016    Recent Labs Lab 06/10/16 1618  06/15/16 0354  NA 135  < > 135  K 4.7  < > 4.2  CL 99*  < > 97*  CO2 27  < > 28  BUN 26*  < > 55*  CREATININE 1.85*  < > 1.76*  CALCIUM  9.1  < > 9.1  PROT 6.0*  --   --   BILITOT 1.3*  --   --   ALKPHOS 108  --   --   ALT 20  --   --   AST 32  --   --   GLUCOSE 148*  < > 121*  < > = values in this interval not displayed. Lab Results  Component Value Date   CHOL 108 05/20/2011   HDL 42 05/20/2011   LDLCALC 58 05/20/2011   TRIG 41 05/20/2011   BNP (last 3 results)  Recent Labs  05/31/16 1523 06/10/16 1616 06/10/16 1907  BNP 2,269.9* 796.2* 652.9*    ProBNP (last 3 results) No results for input(s): PROBNP in the last 8760 hours.   Diagnostic Studies/Procedures   No results found.  Discharge Medications     Medication List    STOP taking these medications   ENTRESTO 24-26 MG Generic drug:  sacubitril-valsartan   metolazone 2.5 MG tablet Commonly known as:  ZAROXOLYN   potassium chloride SA 20 MEQ tablet Commonly known as:  K-DUR,KLOR-CON     TAKE these medications   acetaminophen 500 MG tablet Commonly known as:  TYLENOL Take 2 tablets (1,000 mg total) by mouth 3 (three) times daily. What changed:  when to take this   acidophilus  Caps capsule Take 1 capsule by mouth daily.   allopurinol 300 MG tablet Commonly known as:  ZYLOPRIM Take 300 mg by mouth daily.   atorvastatin 20 MG tablet Commonly known as:  LIPITOR Take 20 mg by mouth daily at 6 PM.   carvedilol 6.25 MG tablet Commonly known as:  COREG Take 3.125 mg by mouth 2 (two) times daily with a meal.   escitalopram 10 MG tablet Commonly known as:  LEXAPRO Take 1 tablet (10 mg total) by mouth daily.   esomeprazole 20 MG capsule Commonly known as:  NEXIUM Take 20 mg by mouth every morning.   famotidine 20 MG tablet Commonly known as:  PEPCID Take 20 mg by mouth daily.   ketotifen 0.025 % ophthalmic solution Commonly known as:  ZADITOR Place 1 drop into both eyes daily as needed (dry eyes).   LORazepam 0.5 MG tablet Commonly known as:  ATIVAN Take one tablet by mouth twice daily for anxiety   NITROSTAT 0.4 MG SL  tablet Generic drug:  nitroGLYCERIN Place 0.4 mg under the tongue every 5 (five) minutes as needed for chest pain (MAX 3 TABLETS).   oxyCODONE 5 MG immediate release tablet Commonly known as:  Oxy IR/ROXICODONE Take 1 tablet (5 mg total) by mouth every 6 (six) hours as needed for moderate pain.   PATADAY 0.2 % Soln Generic drug:  Olopatadine HCl Place 1 drop into both eyes daily.   RA ASPIRIN ADULT LOW STRENGTH 81 MG EC tablet Generic drug:  aspirin Take 1 tablet by mouth daily.   tiZANidine 2 MG tablet Commonly known as:  ZANAFLEX Take 1 tablet (2 mg total) by mouth every 8 (eight) hours as needed for muscle spasms.   torsemide 20 MG tablet Commonly known as:  DEMADEX Take 1 tablet (20 mg total) by mouth daily.       Disposition   The patient will be discharged in stable condition to home. Discharge Instructions    (HEART FAILURE PATIENTS) Call MD:  Anytime you have any of the following symptoms: 1) 3 pound weight gain in 24 hours or 5 pounds in 1 week 2) shortness of breath, with or without a dry hacking cough 3) swelling in the hands, feet or stomach 4) if you have to sleep on extra pillows at night in order to breathe.    Complete by:  As directed    Diet - low sodium heart healthy    Complete by:  As directed    Heart Failure patients record your daily weight using the same scale at the same time of day    Complete by:  As directed    Increase activity slowly    Complete by:  As directed      Follow-up Information    [facility] Follow up on 06/20/2016.   Specialty:  Cardiology Why:  at 11 am for post hospital follow up. Please bring all of your medications to your visit. The code for parking is 4000. Contact information: [address] [id number] [facility] .   Why:  They will do your home health care at your home Contact  information: [address] [place] [address] [phone]             Duration of Discharge Encounter: Greater than 35 minutes   Signed, Shirley Friar PA-C 06/15/2016, 2:50 PM

## 2016-06-15 NOTE — Consult Note (Signed)
   [facility] CM Inpatient Consult   06/15/2016  BLASE BATLEY [birth year] [id number]   Follow up:  Patient to discharge today.  Patient consents to ongoing RNCM follow up.  Patient also has [place] for acute home visits and home health care.  For questions, please contact;  Natividad Brood, RN BSN [place] [facility] Liaison  [phone] business mobile phone Toll free office [phone]

## 2016-06-16 ENCOUNTER — Other Ambulatory Visit: Payer: Self-pay | Admitting: *Deleted

## 2016-06-16 NOTE — Patient Outreach (Signed)
[facility]) Care Management  06/16/2016  [name] December 10, 1926 [id number]   Member discharged from hospital yesterday after being admitted on [id number] for complications of heart failure.  Call placed to caregiver, [name].  She state that the member is doing much better since his hospitalization.  She report that his medications were changed, and she has adjusted his pill box.  She also state that she purchased the member a digital scale, but he is still unable to read it.  She state she will obtain a talking scale to help with his compliance of daily weights.  She denies any questions regarding management of heart failure at this time as education/information was provided by this care manager last week during home visit.  He has follow up appointment scheduled for next week.  Patient was recently discharged from hospital and all medications have been reviewed.  Will follow up next week with weekly transition of care call.  Valente David, [place], MSN [place] [phone]

## 2016-06-17 ENCOUNTER — Encounter: Payer: Self-pay | Admitting: Cardiology

## 2016-06-17 ENCOUNTER — Encounter: Payer: Self-pay | Admitting: Internal Medicine

## 2016-06-20 ENCOUNTER — Inpatient Hospital Stay (HOSPITAL_COMMUNITY): Payer: Medicare Other

## 2016-06-21 ENCOUNTER — Encounter (HOSPITAL_COMMUNITY): Payer: Self-pay

## 2016-06-21 ENCOUNTER — Ambulatory Visit (HOSPITAL_COMMUNITY)
Admission: RE | Admit: 2016-06-21 | Discharge: 2016-06-21 | Disposition: A | Discharge status: Home / Self Care | Payer: Medicare Other | Source: Ambulatory Visit | Attending: Cardiology | Admitting: Cardiology

## 2016-06-21 VITALS — BP 114/78 | HR 56 | Wt 124.6 lb

## 2016-06-21 DIAGNOSIS — Z951 Presence of aortocoronary bypass graft: Secondary | ICD-10-CM | POA: Diagnosis not present

## 2016-06-21 DIAGNOSIS — Z95810 Presence of automatic (implantable) cardiac defibrillator: Secondary | ICD-10-CM | POA: Insufficient documentation

## 2016-06-21 DIAGNOSIS — N179 Acute kidney failure, unspecified: Secondary | ICD-10-CM | POA: Insufficient documentation

## 2016-06-21 DIAGNOSIS — E785 Hyperlipidemia, unspecified: Secondary | ICD-10-CM | POA: Insufficient documentation

## 2016-06-21 DIAGNOSIS — I5022 Chronic systolic (congestive) heart failure: Secondary | ICD-10-CM | POA: Diagnosis not present

## 2016-06-21 DIAGNOSIS — I130 Hypertensive heart and chronic kidney disease with heart failure and stage 1 through stage 4 chronic kidney disease, or unspecified chronic kidney disease: Secondary | ICD-10-CM | POA: Diagnosis not present

## 2016-06-21 DIAGNOSIS — Z79899 Other long term (current) drug therapy: Secondary | ICD-10-CM | POA: Diagnosis not present

## 2016-06-21 DIAGNOSIS — I255 Ischemic cardiomyopathy: Secondary | ICD-10-CM | POA: Insufficient documentation

## 2016-06-21 DIAGNOSIS — Z7982 Long term (current) use of aspirin: Secondary | ICD-10-CM | POA: Diagnosis not present

## 2016-06-21 DIAGNOSIS — Z833 Family history of diabetes mellitus: Secondary | ICD-10-CM | POA: Insufficient documentation

## 2016-06-21 DIAGNOSIS — Z8249 Family history of ischemic heart disease and other diseases of the circulatory system: Secondary | ICD-10-CM | POA: Insufficient documentation

## 2016-06-21 DIAGNOSIS — K219 Gastro-esophageal reflux disease without esophagitis: Secondary | ICD-10-CM | POA: Diagnosis not present

## 2016-06-21 DIAGNOSIS — I472 Ventricular tachycardia, unspecified: Secondary | ICD-10-CM

## 2016-06-21 DIAGNOSIS — N183 Chronic kidney disease, stage 3 (moderate): Secondary | ICD-10-CM | POA: Diagnosis not present

## 2016-06-21 DIAGNOSIS — I2510 Atherosclerotic heart disease of native coronary artery without angina pectoris: Secondary | ICD-10-CM | POA: Diagnosis not present

## 2016-06-21 DIAGNOSIS — I071 Rheumatic tricuspid insufficiency: Secondary | ICD-10-CM | POA: Insufficient documentation

## 2016-06-21 DIAGNOSIS — M109 Gout, unspecified: Secondary | ICD-10-CM | POA: Diagnosis not present

## 2016-06-21 LAB — BASIC METABOLIC PANEL
ANION GAP: 8 (ref 5–15)
BUN: 40 mg/dL — ABNORMAL HIGH (ref 6–20)
CHLORIDE: 104 mmol/L (ref 101–111)
CO2: 29 mmol/L (ref 22–32)
Calcium: 10 mg/dL (ref 8.9–10.3)
Creatinine, Ser: 1.44 mg/dL — ABNORMAL HIGH (ref 0.61–1.24)
GFR calc non Af Amer: 42 mL/min — ABNORMAL LOW (ref >60)
GFR, EST AFRICAN AMERICAN: 49 mL/min — AB (ref >60)
Glucose, Bld: 120 mg/dL — ABNORMAL HIGH (ref 65–99)
POTASSIUM: 4.3 mmol/L (ref 3.5–5.1)
SODIUM: 141 mmol/L (ref 135–145)

## 2016-06-21 LAB — BRAIN NATRIURETIC PEPTIDE: B NATRIURETIC PEPTIDE 5: 764.8 pg/mL — AB (ref 0.0–100.0)

## 2016-06-21 MED ORDER — TORSEMIDE 20 MG PO TABS: 20.0000 mg | ORAL_TABLET | ORAL | 3 refills | Status: DC

## 2016-06-21 NOTE — Patient Instructions (Signed)
Routine lab work today. Will notify you of abnormal results, otherwise no news is good news!  REDUCE Torsemide to 20 mg (1 tablet) every OTHER day.  Follow up 2 weeks with Oda Kilts, PA-C.  Follow up 6 weeks with Dr. Aundra Dubin.  Do the following things EVERYDAY: 1) Weigh yourself in the morning before breakfast. Write it down and keep it in a log. 2) Take your medicines as prescribed 3) Eat low salt foods-Limit salt (sodium) to 2000 mg per day.  4) Stay as active as you can everyday 5) Limit all fluids for the day to less than 2 liters

## 2016-06-21 NOTE — Progress Notes (Signed)
PCP: Dr. Ardeth Perfect HF Cardiology: Dr. Aundra Dubin  82 yo with CAD s/p CABG and chronic systolic CHF from ischemic cardiomyopathy presents for HF clinic evaluation.  He has a Medtronic CRT-D device.  Echo in 11/15 showed EF 20-25%.  He became gradually more symptomatic over the last 2 months.  He normally walks with a cane or walker and can only get about 30-40 feet before getting short of breath. He had VT x 2 in 9/17 terminated by ATP.  At last appointment, he had been on daily metolazone + Lasix.  I transitioned him to torsemide 80 mg daily + metolazone only once a week.  Entresto was increased to 24/26 bid from 1/2 tablet 24/26 bid, BP was high at the time.   He presents today for post hospital follow up. States he is feeling better from hospitilzation.  Still having occasional lightheadedness and dizziness.  Did not take Coreg this morning. Denies orthopnea or PND. Sleeps on 2 pillow chronically for comfort.  No further falls.   Repeat Echo (10/17) showed EF 35-40%, mildly dilated RV with mild to moderately decreased systolic function, severe TR, PASP 48 mmHg.   Optivol: fluid index remains well below threshold with impedence above index.   Labs (9/17): creatinine 1.13, K 3.4, hgb 9.4, BNP 2230 => 2270 Labs (10/17): K 4.1, creatinine 1.56   PMH: 1. Atrial flutter: Paroxysmal. 2. PVCs, h/o VT.  3. Chronic systolic CHF: Ischemic cardiomyopathy.  Medtronic CRT-D.   - Echo (11/15): EF 20-25%, mildly dilated LV.   - Echo (10/17) showed EF 35-40%, mildly dilated RV with mild to moderately decreased systolic function, severe TR, PASP 48 mmHg. 4. H/o SBO 5. HTN 6. Gout  7. Hyperlipidemia 8. GERD 9. CAD: s/p CABG in 5/12 with LIMA-LAD, SVG-ramus, SVG-D1, SVG-PDA. - PCI to OM1 and proximal LCx in 8/12.   SH: Nonsmoker, lives in apartment, no ETOH.    Family History  Problem Relation Age of Onset  . Heart Problems Mother   . Diabetes Mother   . Heart Problems Brother    ROS: All systems  reviewed and negative except as per HPI.   Current Outpatient Prescriptions  Medication Sig Dispense Refill  . acetaminophen (TYLENOL) 500 MG tablet Take 2 tablets (1,000 mg total) by mouth 3 (three) times daily.    Marland Kitchen acidophilus (RISAQUAD) CAPS capsule Take 1 capsule by mouth daily.    Marland Kitchen allopurinol (ZYLOPRIM) 300 MG tablet Take 300 mg by mouth daily.      Marland Kitchen atorvastatin (LIPITOR) 20 MG tablet Take 20 mg by mouth daily at 6 PM.    . carvedilol (COREG) 6.25 MG tablet Take 3.125 mg by mouth 2 (two) times daily with a meal.     . escitalopram (LEXAPRO) 10 MG tablet Take 1 tablet (10 mg total) by mouth daily.  0  . esomeprazole (NEXIUM) 20 MG capsule Take 20 mg by mouth every morning.     . famotidine (PEPCID) 20 MG tablet Take 20 mg by mouth daily.     Marland Kitchen ketotifen (ZADITOR) 0.025 % ophthalmic solution Place 1 drop into both eyes daily as needed (dry eyes).     . LORazepam (ATIVAN) 0.5 MG tablet Take one tablet by mouth twice daily for anxiety 60 tablet 0  . PATADAY 0.2 % SOLN Place 1 drop into both eyes daily.     Marland Kitchen RA ASPIRIN ADULT LOW STRENGTH 81 MG EC tablet Take 1 tablet by mouth daily.  0  . tiZANidine (ZANAFLEX) 2 MG  tablet Take 1 tablet (2 mg total) by mouth every 8 (eight) hours as needed for muscle spasms. 30 tablet 0  . torsemide (DEMADEX) 20 MG tablet Take 1 tablet (20 mg total) by mouth daily. 34 tablet 6  . NITROSTAT 0.4 MG SL tablet Place 0.4 mg under the tongue every 5 (five) minutes as needed for chest pain (MAX 3 TABLETS).     Marland Kitchen oxyCODONE (OXY IR/ROXICODONE) 5 MG immediate release tablet Take 1 tablet (5 mg total) by mouth every 6 (six) hours as needed for moderate pain. (Patient not taking: Reported on 06/21/2016) 30 tablet 0   No current facility-administered medications for this encounter.    BP 114/78 (BP Location: Left Arm, Patient Position: Sitting, Cuff Size: Normal)   Pulse (!) 56   Wt 124 lb 9.6 oz (56.5 kg)   SpO2 98%   BMI 20.73 kg/m    Wt Readings from Last 3  Encounters:  06/21/16 124 lb 9.6 oz (56.5 kg)  06/15/16 118 lb 2.7 oz (53.6 kg)  06/10/16 127 lb 12.8 oz (58 kg)    General: NAD Neck: JVP flat cm, no thyromegaly or thyroid nodule.  Lungs: Clear, normal effort CV: Nondisplaced PMI.  Heart regular S1/S2, no S3/S4, 3/6 HSM at LLSB.  No edema.  No carotid bruit.  Normal pedal pulses.  Abdomen: Soft, NT, ND, no HSM. No bruits or masses. +BS  Skin: Intact without lesions or rashes.  Neurologic: Alert and oriented x 3.  Psych: Normal affect, slightly flat Extremities: No clubbing or cyanosis.  HEENT: Normal.   Assessment/Plan: 1. Chronic systolic CHF: Ischemic cardiomyopathy.  He has a Medtronic CRT-D device.  Repeat echo showed EF up a bit to 35-40%.  - Overall he hast lost 27 lbs since he was switched from lasix to torsemide.  - Optivol remains well below threshold with thoracic impedence still with mild up trend.  - Still lightheaded at times, but less so, and not every time he stands.  - Decreased torsemide to 20 mg every other day.  BMET/BNP today. - He has failed Entresto with marked hypotension. Poor choice for him with age and tendency to fall.   - Continue coreg 6.25 mg BID.  - He would not be a good candidate for advanced therapies given his age.  2. CAD: s/p CABG.  No chest pain.  - Continue statin and ASA 81 daily.  3. VT: ATP 9/17 was successful.  No VT since then.   4. Murmur: Severe TR on echo.   5. AKI on CKD III - BMET today. Suspect creatinine remains elevated. Decreasing torsemide as above.   Shirley Friar, PA-C 06/21/2016  Total time spent > 25 minutes. Over half that time spent discussing the above.

## 2016-06-21 NOTE — Progress Notes (Signed)
Advanced Heart Failure Medication Review by a Pharmacist  Does the patient  feel that his/her medications are working for him/her?  yes  Has the patient been experiencing any side effects to the medications prescribed?  no  Does the patient measure his/her own blood pressure or blood glucose at home?  yes   Does the patient have any problems obtaining medications due to transportation or finances?   no  Understanding of regimen: fair Understanding of indications: fair Potential of compliance: good Patient understands to avoid NSAIDs. Patient understands to avoid decongestants.  Issues to address at subsequent visits: None   Pharmacist comments:  [name] is a pleasant 82 yo M presenting with his daughter and a current medication list. They report good compliance with his regimen and did not have any specific medication-related questions or concerns for me at this time.   Ruta Hinds. Velva Harman, PharmD, BCPS, CPP Clinical Pharmacist Pager: [phone] Phone: [phone] 06/21/2016 2:16 PM      Time with patient: 10 minutes Preparation and documentation time: 2 minutes Total time: 12 minutes

## 2016-06-22 ENCOUNTER — Ambulatory Visit (INDEPENDENT_AMBULATORY_CARE_PROVIDER_SITE_OTHER): Payer: Medicare Other

## 2016-06-22 DIAGNOSIS — I5022 Chronic systolic (congestive) heart failure: Secondary | ICD-10-CM

## 2016-06-22 DIAGNOSIS — Z95810 Presence of automatic (implantable) cardiac defibrillator: Secondary | ICD-10-CM

## 2016-06-22 NOTE — Progress Notes (Signed)
EPIC Encounter for ICM Monitoring  Patient Name: [name] is a 82 y.o. male Date: 06/22/2016 Primary Care Physican: [name], MD Primary Cardiologist:Harwani/McLean  Electrophysiologist: [name] Dry Weight: 124 lb  Bi-V Pacing:  92.1%       Spoke with friend [name], [place].  She reported patient is doing much better since he no longer has any fluid.  He visited HF clinic yesterday, 06/21/2016 and since he was on the dry side, they decreased the torsemide to 20 mg every other day.  He does not have any leg swelling now and lost about 27 lbs of fluid.     Thoracic impedance abnormal suggesting dryness.  Recommendations: No changes.  Encouraged to call for fluid symptoms.  Advised for patient to limit salt intake to 2000 mg daily.   Follow-up plan: ICM clinic phone appointment on 07/20/2016.  Copy of ICM check sent to primary cardiologist and device physician.   ICM trend: 06/22/2016       [name], RN 06/22/2016 11:58 AM

## 2016-06-24 ENCOUNTER — Other Ambulatory Visit: Payer: Self-pay | Admitting: *Deleted

## 2016-06-24 NOTE — Patient Outreach (Signed)
[facility]) Care Management  06/24/2016  LARZ BISSETTE 1926/11/19 [id number]   Weekly transition of care call placed to caregiver, [name].  She state that the member is improving, not having as much shortness of breath and dizziness.  She confirms that [place] are active in [facility] care with nursing and PT.  She state that he had a follow up appointment with the heart failure clinic and some medications were changed (Torsemide decreased).  He has not been on the entresto since discharge.  She state that he is attempting to weigh daily, denying weight gain.  She report that the PT has provided exercises for him to perform independently to increase strength, but she state he is not doing them without the therapist.  She state that she will continue to provide support and encouragement to him.  Will follow up next week.  Valente David, [place], MSN [place] [phone]

## 2016-06-28 ENCOUNTER — Telehealth (HOSPITAL_COMMUNITY): Payer: Self-pay

## 2016-06-28 NOTE — Telephone Encounter (Signed)
[place] RN Amy Alma Downs called to report patient had 4 lb weight gain this past week.  Abdominal girth down, no s/s of edema, sob.  Dizziness improved. Last weeks OV advised to cut diuretic back to every other day.  Advised to continue to monitor closely, encourage patient to weigh and record daily.  RN to return back to patient's home tomorrow to follow up.  Renee Pain, RN

## 2016-06-29 ENCOUNTER — Encounter: Payer: Self-pay | Admitting: Internal Medicine

## 2016-06-30 ENCOUNTER — Other Ambulatory Visit: Payer: Self-pay | Admitting: *Deleted

## 2016-06-30 NOTE — Patient Outreach (Signed)
[facility]) Care Management  06/30/2016  [name] Jan 19, 1927 [id number]   Weekly transition of care call placed member and POA, [name].  Member state that he continue to improve, [name] agrees.  She state that he has been weighing himself more often, weight ranging 125-129 pounds.  He continue to have home health involved, no concerns.  She state that the home health agency will request an extension on the member's allotted time.  She state that the member will have a follow up appointment with the heart failure clinic next week.  She deny any questions at this time, report compliance with medications.  Will follow up next week.  [name], [place], MSN [place] [phone]

## 2016-07-06 ENCOUNTER — Ambulatory Visit (HOSPITAL_COMMUNITY)
Admission: RE | Admit: 2016-07-06 | Discharge: 2016-07-06 | Disposition: A | Discharge status: Home / Self Care | Payer: Medicare Other | Source: Ambulatory Visit | Attending: Cardiology | Admitting: Cardiology

## 2016-07-06 VITALS — BP 140/82 | HR 62 | Wt 138.0 lb

## 2016-07-06 DIAGNOSIS — Z7982 Long term (current) use of aspirin: Secondary | ICD-10-CM | POA: Diagnosis not present

## 2016-07-06 DIAGNOSIS — I255 Ischemic cardiomyopathy: Secondary | ICD-10-CM | POA: Insufficient documentation

## 2016-07-06 DIAGNOSIS — M109 Gout, unspecified: Secondary | ICD-10-CM | POA: Insufficient documentation

## 2016-07-06 DIAGNOSIS — Z95810 Presence of automatic (implantable) cardiac defibrillator: Secondary | ICD-10-CM

## 2016-07-06 DIAGNOSIS — I2510 Atherosclerotic heart disease of native coronary artery without angina pectoris: Secondary | ICD-10-CM | POA: Diagnosis not present

## 2016-07-06 DIAGNOSIS — E785 Hyperlipidemia, unspecified: Secondary | ICD-10-CM | POA: Insufficient documentation

## 2016-07-06 DIAGNOSIS — I130 Hypertensive heart and chronic kidney disease with heart failure and stage 1 through stage 4 chronic kidney disease, or unspecified chronic kidney disease: Secondary | ICD-10-CM | POA: Insufficient documentation

## 2016-07-06 DIAGNOSIS — K219 Gastro-esophageal reflux disease without esophagitis: Secondary | ICD-10-CM | POA: Insufficient documentation

## 2016-07-06 DIAGNOSIS — Z951 Presence of aortocoronary bypass graft: Secondary | ICD-10-CM | POA: Diagnosis not present

## 2016-07-06 DIAGNOSIS — I4892 Unspecified atrial flutter: Secondary | ICD-10-CM | POA: Insufficient documentation

## 2016-07-06 DIAGNOSIS — N183 Chronic kidney disease, stage 3 (moderate): Secondary | ICD-10-CM | POA: Insufficient documentation

## 2016-07-06 DIAGNOSIS — I5022 Chronic systolic (congestive) heart failure: Secondary | ICD-10-CM | POA: Insufficient documentation

## 2016-07-06 DIAGNOSIS — I472 Ventricular tachycardia, unspecified: Secondary | ICD-10-CM

## 2016-07-06 LAB — BASIC METABOLIC PANEL
Anion gap: 7 (ref 5–15)
BUN: 17 mg/dL (ref 6–20)
CO2: 23 mmol/L (ref 22–32)
CREATININE: 1.07 mg/dL (ref 0.61–1.24)
Calcium: 9.3 mg/dL (ref 8.9–10.3)
Chloride: 109 mmol/L (ref 101–111)
Glucose, Bld: 96 mg/dL (ref 65–99)
POTASSIUM: 4.7 mmol/L (ref 3.5–5.1)
SODIUM: 139 mmol/L (ref 135–145)

## 2016-07-06 LAB — BRAIN NATRIURETIC PEPTIDE: B NATRIURETIC PEPTIDE 5: 1183.1 pg/mL — AB (ref 0.0–100.0)

## 2016-07-06 MED ORDER — TORSEMIDE 20 MG PO TABS: 20.0000 mg | ORAL_TABLET | Freq: Every day | ORAL | 3 refills | Status: DC

## 2016-07-06 MED ORDER — ACETAMINOPHEN 500 MG PO TABS: 500.0000 mg | ORAL_TABLET | Freq: Two times a day (BID) | ORAL | Status: DC | PRN

## 2016-07-06 NOTE — Patient Instructions (Signed)
Routine lab work today. Will notify you of abnormal results, otherwise no news is good news!  REDUCE Tylenol to 500 mg twice daily AS NEEDED for pain.  INCREASE Torsemide to 20 mg tablet once daily.  Follow up 2 weeks with Oda Kilts PA-C.  Do the following things EVERYDAY: 1) Weigh yourself in the morning before breakfast. Write it down and keep it in a log. 2) Take your medicines as prescribed 3) Eat low salt foods-Limit salt (sodium) to 2000 mg per day.  4) Stay as active as you can everyday 5) Limit all fluids for the day to less than 2 liters

## 2016-07-06 NOTE — Progress Notes (Signed)
PCP: Dr. Ardeth Perfect HF Cardiology: Dr. Aundra Dubin  82 yo with CAD s/p CABG and chronic systolic CHF from ischemic cardiomyopathy presents for HF clinic evaluation.  He has a Medtronic CRT-D device.  Echo in 11/15 showed EF 20-25%.  He became gradually more symptomatic over the last 2 months.  He normally walks with a cane or walker and can only get about 30-40 feet before getting short of breath. He had VT x 2 in 9/17 terminated by ATP.  At last appointment, he had been on daily metolazone + Lasix.  I transitioned him to torsemide 80 mg daily + metolazone only once a week.  Entresto was increased to 24/26 bid from 1/2 tablet 24/26 bid, BP was high at the time.   Admitted from clinic 06/10/16 with orthostasis and 24 lb weight loss after recent switch from lasix to torsemide.  Optivol with fluid index well below threshold and impedance continuing up.  With orthostasis, light headedness, and living alone pt was admitted for dehydration. AKI noted on labwork. Diuretics held on admission and given IV fluid. Entresto stopped. Dizziness/orthostasis improved over weekend.  Torsemide resumed 06/13/16, then cut back further on 10/10 and again on 06/15/16 for uptrend in BUN.    He presents today for regular follow up. Last visit torsemide decreased with optivol showing continued dry status and occasional dizziness.  Still very seldom gets dizzy.  Weight is up 14 lbs since last visit. Can walk about 40-50 yards without SOB. Does have occasional SOB with showering or changing clothes.  No further falls.  No orthopnea.  Mild bendopnea.   Repeat Echo (10/17) showed EF 35-40%, mildly dilated RV with mild to moderately decreased systolic function, severe TR, PASP 48 mmHg.   Optivol:  Fluid index with mild uptrend, but no recent crossings. Thoracic impedence below threshold. Pt activity < 1 hr daily.   Labs (9/17): creatinine 1.13, K 3.4, hgb 9.4, BNP 2230 => 2270 Labs (10/17): K 4.1, creatinine 1.56   PMH: 1. Atrial  flutter: Paroxysmal. 2. PVCs, h/o VT.  3. Chronic systolic CHF: Ischemic cardiomyopathy.  Medtronic CRT-D.   - Echo (11/15): EF 20-25%, mildly dilated LV.   - Echo (10/17) showed EF 35-40%, mildly dilated RV with mild to moderately decreased systolic function, severe TR, PASP 48 mmHg. 4. H/o SBO 5. HTN 6. Gout  7. Hyperlipidemia 8. GERD 9. CAD: s/p CABG in 5/12 with LIMA-LAD, SVG-ramus, SVG-D1, SVG-PDA. - PCI to OM1 and proximal LCx in 8/12.   SH: Nonsmoker, lives in apartment, no ETOH.    Family History  Problem Relation Age of Onset  . Heart Problems Mother   . Diabetes Mother   . Heart Problems Brother    ROS: All systems reviewed and negative except as per HPI.   Current Outpatient Prescriptions  Medication Sig Dispense Refill  . acetaminophen (TYLENOL) 500 MG tablet Take 2 tablets (1,000 mg total) by mouth 3 (three) times daily.    Marland Kitchen acidophilus (RISAQUAD) CAPS capsule Take 1 capsule by mouth daily.    Marland Kitchen allopurinol (ZYLOPRIM) 300 MG tablet Take 300 mg by mouth daily.      Marland Kitchen atorvastatin (LIPITOR) 20 MG tablet Take 20 mg by mouth daily at 6 PM.    . carvedilol (COREG) 6.25 MG tablet Take 3.125 mg by mouth 2 (two) times daily with a meal.     . escitalopram (LEXAPRO) 10 MG tablet Take 1 tablet (10 mg total) by mouth daily.  0  . esomeprazole (NEXIUM) 20 MG  capsule Take 20 mg by mouth every morning.     . famotidine (PEPCID) 20 MG tablet Take 20 mg by mouth daily.     Marland Kitchen ketotifen (ZADITOR) 0.025 % ophthalmic solution Place 1 drop into both eyes daily as needed (dry eyes).     . LORazepam (ATIVAN) 0.5 MG tablet Take one tablet by mouth twice daily for anxiety 60 tablet 0  . NITROSTAT 0.4 MG SL tablet Place 0.4 mg under the tongue every 5 (five) minutes as needed for chest pain (MAX 3 TABLETS).     Marland Kitchen PATADAY 0.2 % SOLN Place 1 drop into both eyes daily.     Marland Kitchen RA ASPIRIN ADULT LOW STRENGTH 81 MG EC tablet Take 1 tablet by mouth daily.  0  . tiZANidine (ZANAFLEX) 2 MG tablet Take  1 tablet (2 mg total) by mouth every 8 (eight) hours as needed for muscle spasms. 30 tablet 0  . torsemide (DEMADEX) 20 MG tablet Take 1 tablet (20 mg total) by mouth every other day. 45 tablet 3  . oxyCODONE (OXY IR/ROXICODONE) 5 MG immediate release tablet Take 1 tablet (5 mg total) by mouth every 6 (six) hours as needed for moderate pain. (Patient not taking: Reported on 07/06/2016) 30 tablet 0   No current facility-administered medications for this encounter.    BP 140/82 (BP Location: Right Arm, Patient Position: Sitting, Cuff Size: Normal)   Pulse 62   Wt 138 lb (62.6 kg)   SpO2 97%   BMI 22.96 kg/m    Wt Readings from Last 3 Encounters:  07/06/16 138 lb (62.6 kg)  06/21/16 124 lb 9.6 oz (56.5 kg)  06/15/16 118 lb 2.7 oz (53.6 kg)    General: NAD Neck: JVP flat cm, no thyromegaly or thyroid nodule.  Lungs: Clear, normal effort CV: Nondisplaced PMI.  Heart regular S1/S2, no S3/S4, 3/6 HSM at LLSB.  No edema.  No carotid bruit.  Normal pedal pulses.  Abdomen: Soft, NT, ND, no HSM. No bruits or masses. +BS  Skin: Intact without lesions or rashes.  Neurologic: Alert and oriented x 3.  Psych: Normal affect, slightly flat Extremities: No clubbing or cyanosis.  HEENT: Normal.   Assessment/Plan: 1. Chronic systolic CHF: Ischemic cardiomyopathy.  He has a Medtronic CRT-D device.  Repeat echo showed EF up a bit to 35-40%.  - Weight is up 14 lbs since last visit, though he is still down 13 lbs from when he was switched to torsemide. - Optivol remains well below threshold, though with mild uptrend. Thoracic impedence below threshold.  - He still becomes lightheaded at times, but has improved.  - Increase torsemide to 20 mg daily. BMET today.  - He has failed Entresto with marked hypotension. Poor choice for him with age and tendency to fall.   - Continue coreg 6.25 mg BID.  - He would not be a good candidate for advanced therapies given his age.  2. CAD: s/p CABG.  No chest pain.  -  Continue statin and ASA 81 daily.  3. VT: ATP 9/17 was successful.  No VT since then.   4. Murmur: Severe TR on echo.   5. AKI on CKD III - Repeat BMET today   Volume status trending up, but functional status near his baseline. Increasing torsemide and checking labs as above.  Will follow up 2 weeks. Keep follow up with Dr. Aundra Dubin for 4 weeks.  Shirley Friar, PA-C 07/06/2016  Total time spent > 25 minutes. Over half that  spent discussing the above.

## 2016-07-08 ENCOUNTER — Encounter: Payer: Self-pay | Admitting: Internal Medicine

## 2016-07-08 ENCOUNTER — Other Ambulatory Visit: Payer: Self-pay | Admitting: *Deleted

## 2016-07-08 NOTE — Patient Outreach (Addendum)
[facility]) Care Management  07/08/2016  [name] May 17, 1927 [id number]  Call placed to member for weekly transition of are follow up.  He state that he is "doing fine."  He report that he has been taking his medications as instructed (with the help of his caregiver, [name]) and doing daily weights.  He state his weight today is 135 pounds.  He state he continue to be involved with home health nursing and PT.  He denies any swelling,chest discomfort or shortness of breath.  This care manager attempted to schedule routine home visit, member requests this care manager contact [name].  Call then placed to [name], no answer.  HIPAA compliant voice message left.  Will await call back.  If no call back, will follow up next week.   Update @ 1200:  Received call back from [name].  She confirms that member continues to progress.  She state that his weight has increased and he has been placed back on Torsemide (was stopped after discharge).  He has a follow up appointment next week with the heart failure clinic.  She denies any concerns at this time, routine home visit scheduled for next week.  Valente David, [place], MSN [place] [phone]

## 2016-07-11 ENCOUNTER — Telehealth: Payer: Self-pay

## 2016-07-11 NOTE — Telephone Encounter (Signed)
Received call from [name] (DPR).  She reported patient's has gained weight and the HF clinic increased Torsemide to 20 mg daily. She reported he has HF clinic appointment on 07/20/2016 and 08/02/2016.  Advised will reschedule ICM remote transmission from 11/15 to 11/21 to check fluid levels.  He is still receiving PT and home health nurse.  No changes today and thanked her for update.

## 2016-07-13 ENCOUNTER — Other Ambulatory Visit: Payer: Self-pay | Admitting: *Deleted

## 2016-07-13 NOTE — Patient Outreach (Signed)
[facility]) Care Management   07/13/2016  REACE BRESHEARS 05-05-1927 [id number]  KORTLAND NICHOLS is an 82 y.o. male  Subjective:   Member state he is "alright", denies any shortness of breath or chest discomfort.  Objective:   Review of Systems  Constitutional: Negative.   HENT: Negative.   Eyes: Negative.   Respiratory: Negative.   Cardiovascular: Negative.   Gastrointestinal: Negative.   Genitourinary: Negative.   Musculoskeletal: Negative.   Skin: Negative.   Neurological: Negative.   Endo/Heme/Allergies: Negative.   Psychiatric/Behavioral: Negative.     Physical Exam  Constitutional: He is oriented to person, place, and time. He appears well-developed and well-nourished.  Neck: Normal range of motion.  Cardiovascular: Normal rate, regular rhythm and normal heart sounds.   Respiratory: Effort normal and breath sounds normal.  GI: Soft. Bowel sounds are normal.  Musculoskeletal: Normal range of motion.  Neurological: He is alert and oriented to person, place, and time.  Skin: Skin is warm and dry.   BP 110/64 (BP Location: Left Arm, Patient Position: Sitting, Cuff Size: Normal)   Pulse 62   Resp 18   Wt 126 lb (57.2 kg)   SpO2 97%   BMI 20.97 kg/m   Encounter Medications:   Outpatient Encounter Prescriptions as of 07/13/2016  Medication Sig  . acetaminophen (TYLENOL) 500 MG tablet Take 1 tablet (500 mg total) by mouth 2 (two) times daily as needed.  Marland Kitchen acidophilus (RISAQUAD) CAPS capsule Take 1 capsule by mouth daily.  Marland Kitchen allopurinol (ZYLOPRIM) 300 MG tablet Take 300 mg by mouth daily.    Marland Kitchen atorvastatin (LIPITOR) 20 MG tablet Take 20 mg by mouth daily at 6 PM.  . carvedilol (COREG) 6.25 MG tablet Take 3.125 mg by mouth 2 (two) times daily with a meal.   . escitalopram (LEXAPRO) 10 MG tablet Take 1 tablet (10 mg total) by mouth daily.  Marland Kitchen esomeprazole (NEXIUM) 20 MG capsule Take 20 mg by mouth every morning.   . famotidine (PEPCID) 20 MG tablet  Take 20 mg by mouth daily.   Marland Kitchen ketotifen (ZADITOR) 0.025 % ophthalmic solution Place 1 drop into both eyes daily as needed (dry eyes).   . LORazepam (ATIVAN) 0.5 MG tablet Take one tablet by mouth twice daily for anxiety  . NITROSTAT 0.4 MG SL tablet Place 0.4 mg under the tongue every 5 (five) minutes as needed for chest pain (MAX 3 TABLETS).   Marland Kitchen oxyCODONE (OXY IR/ROXICODONE) 5 MG immediate release tablet Take 1 tablet (5 mg total) by mouth every 6 (six) hours as needed for moderate pain. (Patient not taking: Reported on 07/06/2016)  . PATADAY 0.2 % SOLN Place 1 drop into both eyes daily.   Marland Kitchen RA ASPIRIN ADULT LOW STRENGTH 81 MG EC tablet Take 1 tablet by mouth daily.  Marland Kitchen tiZANidine (ZANAFLEX) 2 MG tablet Take 1 tablet (2 mg total) by mouth every 8 (eight) hours as needed for muscle spasms.  Marland Kitchen torsemide (DEMADEX) 20 MG tablet Take 1 tablet (20 mg total) by mouth daily.   No facility-administered encounter medications on file as of 07/13/2016.     Functional Status:   In your present state of health, do you have any difficulty performing the following activities: 06/12/2016 06/09/2016  Hearing? N N  Vision? N Y  Difficulty concentrating or making decisions? N N  Walking or climbing stairs? Y Y  Dressing or bathing? Y Y  Doing errands, shopping? [name]  Preparing Food and eating ? -  Y  Using the Toilet? - N  In the past six months, have you accidently leaked urine? - Y  Do you have problems with loss of bowel control? - N  Managing your Medications? - Y  Managing your Finances? - Y  Housekeeping or managing your Housekeeping? - Y  Some recent data might be hidden    Fall/Depression Screening:    PHQ 2/9 Scores 06/09/2016 05/27/2016  PHQ - 2 Score 1 -  Exception Documentation - Other- indicate reason in comment box  Not completed - screening call completed with caregiver    Assessment:    Met with member at scheduled time.  He appears well, gait and stability improved from last home visit.   He now has digital/talking scale that he uses for daily weights.  He state that he tries to write his weights down daily, but is not always able to due to trouble with arthritis in his hands.  He state his home health nurse/PT and his care giver, [name], helps him with the recording.  His weight is back down to his discharge weight, no swelling in legs/feet.  He has heart failure zones posted on his wall, but state that he does not understand them, "[name] keep that up there.  She know what all of that means."  Explained zones to member, he state he is in the [place] zone today.  He continues to use his walker for mobility, and is still working with home health nursing/PT.  Ms. Izola Price is not available today for additional information.  Member denies questions/concerns.    Plan:   Will follow up next week with member/caregiver for weekly transition of care call.  THN CM Care Plan Problem One   Flowsheet Row Most Recent Value  Care Plan Problem One  Knowledge deficit related to heart failure management as evidenced by recent ED visit  Role Documenting the Problem One  Care Management [place] for Problem One  Not Active  THN Long Term Goal (31-90 days)  Member/POA will be able to verbalize heart failure action plan and when to contact MD within the next 31 days  THN Long Term Goal Start Date  06/09/16  [facility] Long Term Goal Met Date  07/08/16  Interventions for Problem One Long Term Goal  Provided member with [facility] calendar tool book, to include action plan for heart failure.  Educated member and POA using teachback on signs/symptoms of heart failure and heart failure zones  THN CM Short Term Goal #1 (0-30 days)  Member will have reliable working scale within the next 2 weeks  THN CM Short Term Goal #1 Start Date  06/09/16  [facility] CM Short Term Goal #1 Met Date  06/24/16  Interventions for Short Term Goal #1  Educated member and POA the importance of having a reliable scale to monitor  daily weights.    THN CM Short Term Goal #2 (0-30 days)  Member increase frequency of self monitoring weights to 3-5 days a week within the next 4 weeks  THN CM Short Term Goal #2 Start Date  06/09/16  [facility] CM Short Term Goal #2 Met Date  06/30/16    [facility] CM Care Plan Problem Two   Flowsheet Row Most Recent Value  Care Plan Problem Two  Risk for hospital readmission related to heart failure as evidenced by recent admission  Role Documenting the Problem Two  Care Management [place] for Problem Two  Active  Interventions for  Problem Two Long Term Goal   Discussed with member the importance of following discharge instructions, including follow up appointments, medications, diet, and home health involvement, to decrease the risk of readmission  THN Long Term Goal (31-90) days  Member will not be readmitted to hospital within 31 days of discharge  [place] Term Goal Start Date  06/16/16     Valente David, RN, MSN [place] [phone]

## 2016-07-20 ENCOUNTER — Encounter (HOSPITAL_COMMUNITY): Payer: Self-pay

## 2016-07-20 ENCOUNTER — Ambulatory Visit (HOSPITAL_COMMUNITY)
Admission: RE | Admit: 2016-07-20 | Discharge: 2016-07-20 | Disposition: A | Discharge status: Home / Self Care | Payer: Medicare Other | Source: Ambulatory Visit | Attending: Internal Medicine | Admitting: Internal Medicine

## 2016-07-20 VITALS — BP 102/68 | HR 60 | Wt 131.0 lb

## 2016-07-20 DIAGNOSIS — N183 Chronic kidney disease, stage 3 (moderate): Secondary | ICD-10-CM | POA: Diagnosis not present

## 2016-07-20 DIAGNOSIS — Z7982 Long term (current) use of aspirin: Secondary | ICD-10-CM | POA: Diagnosis not present

## 2016-07-20 DIAGNOSIS — R011 Cardiac murmur, unspecified: Secondary | ICD-10-CM | POA: Insufficient documentation

## 2016-07-20 DIAGNOSIS — I255 Ischemic cardiomyopathy: Secondary | ICD-10-CM | POA: Insufficient documentation

## 2016-07-20 DIAGNOSIS — Z951 Presence of aortocoronary bypass graft: Secondary | ICD-10-CM | POA: Diagnosis not present

## 2016-07-20 DIAGNOSIS — E785 Hyperlipidemia, unspecified: Secondary | ICD-10-CM | POA: Insufficient documentation

## 2016-07-20 DIAGNOSIS — K219 Gastro-esophageal reflux disease without esophagitis: Secondary | ICD-10-CM | POA: Insufficient documentation

## 2016-07-20 DIAGNOSIS — I2510 Atherosclerotic heart disease of native coronary artery without angina pectoris: Secondary | ICD-10-CM | POA: Diagnosis not present

## 2016-07-20 DIAGNOSIS — M109 Gout, unspecified: Secondary | ICD-10-CM | POA: Insufficient documentation

## 2016-07-20 DIAGNOSIS — I130 Hypertensive heart and chronic kidney disease with heart failure and stage 1 through stage 4 chronic kidney disease, or unspecified chronic kidney disease: Secondary | ICD-10-CM | POA: Diagnosis not present

## 2016-07-20 DIAGNOSIS — I4892 Unspecified atrial flutter: Secondary | ICD-10-CM | POA: Diagnosis not present

## 2016-07-20 DIAGNOSIS — Z9889 Other specified postprocedural states: Secondary | ICD-10-CM | POA: Insufficient documentation

## 2016-07-20 DIAGNOSIS — I472 Ventricular tachycardia, unspecified: Secondary | ICD-10-CM

## 2016-07-20 DIAGNOSIS — K56609 Unspecified intestinal obstruction, unspecified as to partial versus complete obstruction: Secondary | ICD-10-CM | POA: Diagnosis not present

## 2016-07-20 DIAGNOSIS — I5022 Chronic systolic (congestive) heart failure: Secondary | ICD-10-CM | POA: Insufficient documentation

## 2016-07-20 DIAGNOSIS — Z95810 Presence of automatic (implantable) cardiac defibrillator: Secondary | ICD-10-CM

## 2016-07-20 LAB — BASIC METABOLIC PANEL
ANION GAP: 8 (ref 5–15)
BUN: 33 mg/dL — ABNORMAL HIGH (ref 6–20)
CALCIUM: 9.6 mg/dL (ref 8.9–10.3)
CO2: 28 mmol/L (ref 22–32)
CREATININE: 1.36 mg/dL — AB (ref 0.61–1.24)
Chloride: 103 mmol/L (ref 101–111)
GFR, EST AFRICAN AMERICAN: 52 mL/min — AB (ref >60)
GFR, EST NON AFRICAN AMERICAN: 45 mL/min — AB (ref >60)
Glucose, Bld: 105 mg/dL — ABNORMAL HIGH (ref 65–99)
Potassium: 3.9 mmol/L (ref 3.5–5.1)
SODIUM: 139 mmol/L (ref 135–145)

## 2016-07-20 NOTE — Progress Notes (Signed)
Advanced Heart Failure Clinic Note   PCP: Dr. Ardeth Perfect HF Cardiology: Dr. Aundra Dubin  82 yo with CAD s/p CABG and chronic systolic CHF from ischemic cardiomyopathy presents for HF clinic evaluation.  He has a Medtronic CRT-D device.  Echo in 11/15 showed EF 20-25%.  He became gradually more symptomatic over the last 2 months.  He normally walks with a cane or walker and can only get about 30-40 feet before getting short of breath. He had VT x 2 in 9/17 terminated by ATP.  At last appointment, he had been on daily metolazone + Lasix.  I transitioned him to torsemide 80 mg daily + metolazone only once a week.  Entresto was increased to 24/26 bid from 1/2 tablet 24/26 bid, BP was high at the time.   Admitted from clinic 06/10/16 with orthostasis and 24 lb weight loss after recent switch from lasix to torsemide.  Optivol with fluid index well below threshold and impedance continuing up.  With orthostasis, light headedness, and living alone pt was admitted for dehydration. AKI noted on labwork. Diuretics held on admission and given IV fluid. Entresto stopped. Dizziness/orthostasis improved over weekend.  Torsemide resumed 06/13/16, then cut back further on 10/10 and again on 06/15/16 for uptrend in BUN.    He presents today for regular follow up. Down 7 lbs from that visit. Breathing is fair, about the same.  Having occasional SOB with showering or changing clothes. On good days can walk about 40-50 yards. Having allergy problems with the whether changes.  Not getting dizzy as often.  No further falls. No orthopnea. Occasional PND and bendopnea. Weight at home 126 -127 since being back on torsemide ( was 134)  Repeat Echo (10/17) showed EF 35-40%, mildly dilated RV with mild to moderately decreased systolic function, severe TR, PASP 48 mmHg.   Optivol: Fluid well below threshold, thoracic impedence in dry zone. Pt activity < 1 hr day. No VT/VF  Labs (9/17): creatinine 1.13, K 3.4, hgb 9.4, BNP 2230 =>  2270 Labs (10/17): K 4.1, creatinine 1.56   PMH: 1. Atrial flutter: Paroxysmal. 2. PVCs, h/o VT.  3. Chronic systolic CHF: Ischemic cardiomyopathy.  Medtronic CRT-D.   - Echo (11/15): EF 20-25%, mildly dilated LV.   - Echo (10/17) showed EF 35-40%, mildly dilated RV with mild to moderately decreased systolic function, severe TR, PASP 48 mmHg. 4. H/o SBO 5. HTN 6. Gout  7. Hyperlipidemia 8. GERD 9. CAD: s/p CABG in 5/12 with LIMA-LAD, SVG-ramus, SVG-D1, SVG-PDA. - PCI to OM1 and proximal LCx in 8/12.   SH: Nonsmoker, lives in apartment, no ETOH.    Family History  Problem Relation Age of Onset  . Heart Problems Mother   . Diabetes Mother   . Heart Problems Brother    ROS: All systems reviewed and negative except as per HPI.   Current Outpatient Prescriptions  Medication Sig Dispense Refill  . acetaminophen (TYLENOL) 500 MG tablet Take 1 tablet (500 mg total) by mouth 2 (two) times daily as needed. 30 tablet   . acidophilus (RISAQUAD) CAPS capsule Take 1 capsule by mouth daily.    Marland Kitchen allopurinol (ZYLOPRIM) 300 MG tablet Take 300 mg by mouth daily.      Marland Kitchen atorvastatin (LIPITOR) 20 MG tablet Take 20 mg by mouth daily at 6 PM.    . carvedilol (COREG) 6.25 MG tablet Take 3.125 mg by mouth 2 (two) times daily with a meal.     . escitalopram (LEXAPRO) 10 MG tablet Take  1 tablet (10 mg total) by mouth daily.  0  . esomeprazole (NEXIUM) 20 MG capsule Take 20 mg by mouth every morning.     . famotidine (PEPCID) 20 MG tablet Take 20 mg by mouth daily.     Marland Kitchen ketotifen (ZADITOR) 0.025 % ophthalmic solution Place 1 drop into both eyes daily as needed (dry eyes).     . LORazepam (ATIVAN) 0.5 MG tablet Take one tablet by mouth twice daily for anxiety 60 tablet 0  . PATADAY 0.2 % SOLN Place 1 drop into both eyes daily.     Marland Kitchen RA ASPIRIN ADULT LOW STRENGTH 81 MG EC tablet Take 1 tablet by mouth daily.  0  . tiZANidine (ZANAFLEX) 2 MG tablet Take 1 tablet (2 mg total) by mouth every 8 (eight)  hours as needed for muscle spasms. 30 tablet 0  . torsemide (DEMADEX) 20 MG tablet Take 1 tablet (20 mg total) by mouth daily. 90 tablet 3  . NITROSTAT 0.4 MG SL tablet Place 0.4 mg under the tongue every 5 (five) minutes as needed for chest pain (MAX 3 TABLETS).     Marland Kitchen oxyCODONE (OXY IR/ROXICODONE) 5 MG immediate release tablet Take 1 tablet (5 mg total) by mouth every 6 (six) hours as needed for moderate pain. (Patient not taking: Reported on 07/20/2016) 30 tablet 0   No current facility-administered medications for this encounter.    BP 102/68   Pulse 60   Wt 131 lb (59.4 kg)   SpO2 99%   BMI 21.80 kg/m    Wt Readings from Last 3 Encounters:  07/20/16 131 lb (59.4 kg)  07/13/16 126 lb (57.2 kg)  07/06/16 138 lb (62.6 kg)    General: NAD, Elderly appearing.  HEENT: Normal.  Neck: Supple, JVP not elevated, no thyromegaly or thyroid nodule.  Lungs: CTAB, normal effort CV: Nondisplaced PMI.  Heart regular S1/S2, no S3/S4, 3/6 HSM at LLSB.  No edema.  No carotid bruit.  Normal pedal pulses.  Abdomen: Soft, NT, ND, no HSM. No bruits or masses. +BS  Skin: Intact without lesions or rashes.  Neurologic: Alert and oriented x 3.  Psych: Normal affect, slightly flat Extremities: No clubbing or cyanosis.   Assessment/Plan: 1. Chronic systolic CHF: Ischemic cardiomyopathy.  He has a Medtronic CRT-D device.  Repeat echo showed EF up a bit to 35-40%.  - Weight down 7 lbs from last visit with increased torsemide. Volume status stable today by exam and Optivol.  - Continue torsemide 20 mg daily. BMET today.  - He has failed Entresto with marked hypotension. Poor choice for him with age and tendency to fall.   - Continue coreg 6.25 mg BID.  - He would not be a good candidate for advanced therapies given advanced age.  2. CAD: s/p CABG.  No chest pain.  - Continue statin and ASA 81 daily.  3. VT: ATP 9/17 was successful.  No VT since then.   4. Murmur: Severe TR on echo.   5. CKD III - Had  improved. BMET today with increase of torsemide.   Volume status improved. No med changes with soft pressures and stable volume.  Keep follow up with MD for 2 weeks.   Shirley Friar, PA-C 07/20/2016

## 2016-07-20 NOTE — Patient Instructions (Addendum)
Routine lab work today. Will notify you of abnormal results  Follow up with Dr.McLean on Tuesday 08/02/16.

## 2016-07-21 ENCOUNTER — Other Ambulatory Visit: Payer: Self-pay | Admitting: *Deleted

## 2016-07-21 NOTE — Patient Outreach (Signed)
[facility]) Care Management  07/21/2016  [name] February 22, 1927 [id number]   Weekly transition of care call placed to member.  He reports that he is doing well, today's weight 123 pounds.  He denies any chest discomfort or shortness of breath at this time.  He state he had a follow up appointment yesterday with the heart clinic, request for this care manager to contact his care giver, Laural Benes, to discuss plan of care.  Call then placed to [name].  She confirms that member did attend office visit, denies any concerns expressed.  She state that the member's goal weight is 124-127 pounds.  She report that they have been informed to contact MD if his weight is out of that range.  He has another follow up within the next 2 weeks for reassessment.  She state that he has completed work with home health PT and today will be his last visit with home health nursing.  She is made aware that the member has completed his transition of care program, and that this care manager will continue to follow.  Routine home visit scheduled for next month.  Advised to contact with any questions.  Valente David, [place], MSN [place] [phone]

## 2016-07-26 ENCOUNTER — Telehealth: Payer: Self-pay

## 2016-07-26 ENCOUNTER — Ambulatory Visit (INDEPENDENT_AMBULATORY_CARE_PROVIDER_SITE_OTHER): Payer: Medicare Other

## 2016-07-26 DIAGNOSIS — Z95810 Presence of automatic (implantable) cardiac defibrillator: Secondary | ICD-10-CM | POA: Diagnosis not present

## 2016-07-26 DIAGNOSIS — I5022 Chronic systolic (congestive) heart failure: Secondary | ICD-10-CM

## 2016-07-26 NOTE — Telephone Encounter (Signed)
Remote ICM transmission received.  Attempted patient call and left detailed message regarding transmission and next ICM scheduled for 08/23/2016.  Advised to return call for any fluid symptoms or questions.

## 2016-07-26 NOTE — Progress Notes (Signed)
EPIC Encounter for ICM Monitoring  Patient Name: [name] is a 82 y.o. male Date: 07/26/2016 Primary Care Physican: [name], MD Primary Cardiologist:Harwani/McLean  Electrophysiologist: [name] Dry Weight:    unknown  Bi-V Pacing:  94.4%           Attempted ICM call to [name] and unable to reach. Left detailed message regarding transmission.  Transmission reviewed.   Thoracic impedance normal   Follow-up plan: ICM clinic phone appointment on 08/23/2016.  Copy of ICM check sent to device physician.   ICM trend: 07/26/2016       [name], RN 07/26/2016 3:09 PM

## 2016-08-02 ENCOUNTER — Encounter (HOSPITAL_COMMUNITY): Payer: Self-pay

## 2016-08-02 ENCOUNTER — Ambulatory Visit (HOSPITAL_COMMUNITY)
Admission: RE | Admit: 2016-08-02 | Discharge: 2016-08-02 | Disposition: A | Discharge status: Home / Self Care | Payer: Medicare Other | Source: Ambulatory Visit | Attending: Cardiology | Admitting: Cardiology

## 2016-08-02 VITALS — BP 114/55 | HR 59 | Wt 131.8 lb

## 2016-08-02 DIAGNOSIS — I4892 Unspecified atrial flutter: Secondary | ICD-10-CM | POA: Insufficient documentation

## 2016-08-02 DIAGNOSIS — I130 Hypertensive heart and chronic kidney disease with heart failure and stage 1 through stage 4 chronic kidney disease, or unspecified chronic kidney disease: Secondary | ICD-10-CM | POA: Insufficient documentation

## 2016-08-02 DIAGNOSIS — I2510 Atherosclerotic heart disease of native coronary artery without angina pectoris: Secondary | ICD-10-CM | POA: Diagnosis not present

## 2016-08-02 DIAGNOSIS — N183 Chronic kidney disease, stage 3 (moderate): Secondary | ICD-10-CM | POA: Diagnosis not present

## 2016-08-02 DIAGNOSIS — I472 Ventricular tachycardia, unspecified: Secondary | ICD-10-CM

## 2016-08-02 DIAGNOSIS — K219 Gastro-esophageal reflux disease without esophagitis: Secondary | ICD-10-CM | POA: Diagnosis not present

## 2016-08-02 DIAGNOSIS — I5022 Chronic systolic (congestive) heart failure: Secondary | ICD-10-CM | POA: Insufficient documentation

## 2016-08-02 DIAGNOSIS — Z9889 Other specified postprocedural states: Secondary | ICD-10-CM | POA: Insufficient documentation

## 2016-08-02 DIAGNOSIS — I071 Rheumatic tricuspid insufficiency: Secondary | ICD-10-CM | POA: Insufficient documentation

## 2016-08-02 DIAGNOSIS — Z7982 Long term (current) use of aspirin: Secondary | ICD-10-CM | POA: Diagnosis not present

## 2016-08-02 DIAGNOSIS — E785 Hyperlipidemia, unspecified: Secondary | ICD-10-CM | POA: Diagnosis not present

## 2016-08-02 DIAGNOSIS — M109 Gout, unspecified: Secondary | ICD-10-CM | POA: Diagnosis not present

## 2016-08-02 DIAGNOSIS — I255 Ischemic cardiomyopathy: Secondary | ICD-10-CM | POA: Insufficient documentation

## 2016-08-02 DIAGNOSIS — Z951 Presence of aortocoronary bypass graft: Secondary | ICD-10-CM | POA: Insufficient documentation

## 2016-08-02 LAB — BASIC METABOLIC PANEL
ANION GAP: 6 (ref 5–15)
BUN: 30 mg/dL — ABNORMAL HIGH (ref 6–20)
CHLORIDE: 104 mmol/L (ref 101–111)
CO2: 28 mmol/L (ref 22–32)
Calcium: 9.4 mg/dL (ref 8.9–10.3)
Creatinine, Ser: 1.63 mg/dL — ABNORMAL HIGH (ref 0.61–1.24)
GFR calc non Af Amer: 36 mL/min — ABNORMAL LOW (ref >60)
GFR, EST AFRICAN AMERICAN: 42 mL/min — AB (ref >60)
GLUCOSE: 116 mg/dL — AB (ref 65–99)
POTASSIUM: 4.1 mmol/L (ref 3.5–5.1)
Sodium: 138 mmol/L (ref 135–145)

## 2016-08-02 MED ORDER — LISINOPRIL 2.5 MG PO TABS: 2.5000 mg | ORAL_TABLET | Freq: Every day | ORAL | 3 refills | Status: DC

## 2016-08-02 NOTE — Progress Notes (Signed)
    ReDS Vest - 08/02/16 1500      ReDS Vest   MR  No   Estimated volume prior to reading Med   Fitting Posture Standing   Height Marker Short   Ruler Value [place]   ReDS Value 25

## 2016-08-02 NOTE — Patient Instructions (Addendum)
Start Lisinopril 2.5 mg at bedtime  Torsemide dose:  20 mg daily  IF YOU GAIN 3-4 LBS INCREASE TO 40 MG (2 TABS) DAILY FOR 3 DAYS THEN RETURN TO 20 MG DAILY  IF WEIGHT IS LESS THAN 125 LB DO NOT TAKE TORSEMIDE   Labs today  Labs in 2 weeks  Your physician recommends that you schedule a follow-up appointment in: 1 month

## 2016-08-04 NOTE — Progress Notes (Signed)
Advanced Heart Failure Clinic Note   PCP: Dr. Ardeth Perfect HF Cardiology: Dr. Aundra Dubin  82 yo with CAD s/p CABG and chronic systolic CHF from ischemic cardiomyopathy presents for HF clinic evaluation.  He has a Medtronic CRT-D device.  Echo in 11/15 showed EF 20-25%.  He became gradually more symptomatic over the last 2 months.  He normally walks with a cane or walker and can only get about 30-40 feet before getting short of breath. He had VT x 2 in 9/17 terminated by ATP.  At last appointment, he had been on daily metolazone + Lasix.  I transitioned him to torsemide 80 mg daily + metolazone only once a week.  Entresto was increased to 24/26 bid from 1/2 tablet 24/26 bid, BP was high at the time.   Admitted from clinic 06/10/16 with orthostasis and 24 lb weight loss after recent switch from lasix to torsemide.  Optivol with fluid index well below threshold and impedance continuing up.  With orthostasis, light headedness, and living alone pt was admitted for dehydration. AKI noted on labwork. Diuretics held on admission and given IV fluid. Entresto stopped. Dizziness/orthostasis improved over weekend.  Torsemide resumed 06/13/16, then cut back further on 10/10 and again on 06/15/16 for uptrend in BUN.  Repeat Echo (10/17) showed EF 35-40%, mildly dilated RV with mild to moderately decreased systolic function, severe TR, PASP 48 mmHg.   He presents today for regular follow up.  Weight is stable. He walks with a cane, no dyspnea walking on flat ground.  He does get short of breath bending over.  Has been sleeping in recliner for 6-7 months.  No lightheadedness.    Optivol: Fluid below threshold, thoracic impedence stable. No VT/VF.  ReDS vest reading 25.   Labs (9/17): creatinine 1.13, K 3.4, hgb 9.4, BNP 2230 => 2270 Labs (10/17): K 4.1, creatinine 1.56  Labs (11/17): K 3.9, creatinine 1.36, BNP 1183  PMH: 1. Atrial flutter: Paroxysmal. 2. PVCs, h/o VT.  3. Chronic systolic CHF: Ischemic  cardiomyopathy.  Medtronic CRT-D.   - Echo (11/15): EF 20-25%, mildly dilated LV.   - Echo (10/17) showed EF 35-40%, mildly dilated RV with mild to moderately decreased systolic function, severe TR, PASP 48 mmHg. 4. H/o SBO 5. HTN 6. Gout  7. Hyperlipidemia 8. GERD 9. CAD: s/p CABG in 5/12 with LIMA-LAD, SVG-ramus, SVG-D1, SVG-PDA. - PCI to OM1 and proximal LCx in 8/12.   SH: Nonsmoker, lives in apartment, no ETOH.    Family History  Problem Relation Age of Onset  . Heart Problems Mother   . Diabetes Mother   . Heart Problems Brother    ROS: All systems reviewed and negative except as per HPI.   Current Outpatient Prescriptions  Medication Sig Dispense Refill  . acetaminophen (TYLENOL) 500 MG tablet Take 1 tablet (500 mg total) by mouth 2 (two) times daily as needed. 30 tablet   . acidophilus (RISAQUAD) CAPS capsule Take 1 capsule by mouth daily.    Marland Kitchen allopurinol (ZYLOPRIM) 300 MG tablet Take 300 mg by mouth daily.      Marland Kitchen atorvastatin (LIPITOR) 20 MG tablet Take 20 mg by mouth daily at 6 PM.    . carvedilol (COREG) 6.25 MG tablet Take 3.125 mg by mouth 2 (two) times daily with a meal.     . escitalopram (LEXAPRO) 10 MG tablet Take 1 tablet (10 mg total) by mouth daily.  0  . esomeprazole (NEXIUM) 20 MG capsule Take 20 mg by mouth every morning.     Marland Kitchen  famotidine (PEPCID) 20 MG tablet Take 20 mg by mouth daily.     Marland Kitchen ketotifen (ZADITOR) 0.025 % ophthalmic solution Place 1 drop into both eyes daily as needed (dry eyes).     Marland Kitchen NITROSTAT 0.4 MG SL tablet Place 0.4 mg under the tongue every 5 (five) minutes as needed for chest pain (MAX 3 TABLETS).     Marland Kitchen oxyCODONE (OXY IR/ROXICODONE) 5 MG immediate release tablet Take 1 tablet (5 mg total) by mouth every 6 (six) hours as needed for moderate pain. 30 tablet 0  . PATADAY 0.2 % SOLN Place 1 drop into both eyes daily.     Marland Kitchen RA ASPIRIN ADULT LOW STRENGTH 81 MG EC tablet Take 1 tablet by mouth daily.  0  . tiZANidine (ZANAFLEX) 2 MG tablet  Take 1 tablet (2 mg total) by mouth every 8 (eight) hours as needed for muscle spasms. 30 tablet 0  . torsemide (DEMADEX) 20 MG tablet Take 1 tablet (20 mg total) by mouth daily. 90 tablet 3  . lisinopril (PRINIVIL,ZESTRIL) 2.5 MG tablet Take 1 tablet (2.5 mg total) by mouth daily. 30 tablet 3   No current facility-administered medications for this encounter.    BP (!) 114/55   Pulse (!) 59   Wt 131 lb 12.8 oz (59.8 kg)   SpO2 99%   BMI 21.93 kg/m    Wt Readings from Last 3 Encounters:  08/02/16 131 lb 12.8 oz (59.8 kg)  07/20/16 131 lb (59.4 kg)  07/13/16 126 lb (57.2 kg)    General: NAD, Elderly appearing.  HEENT: Normal.  Neck: Supple, JVP not elevated, no thyromegaly or thyroid nodule.  Lungs: CTAB, normal effort CV: Nondisplaced PMI.  Heart regular S1/S2, no S3/S4, 2/6 HSM at LLSB.  No edema.  No carotid bruit.  Normal pedal pulses.  Abdomen: Soft, NT, ND, no HSM. No bruits or masses. +BS  Skin: Intact without lesions or rashes.  Neurologic: Alert and oriented x 3.  Psych: Normal affect, slightly flat Extremities: No clubbing or cyanosis.   Assessment/Plan: 1. Chronic systolic CHF: Ischemic cardiomyopathy.  He has a Medtronic CRT-D device.  Repeat echo 10/17 showed EF up a bit to 35-40%.  Weight stable.  NYHA class II-III, not very active. Euvolemic by exam, ReDS vest, and Optivol.  - Continue torsemide 20 mg daily. BMET today.  - He has failed Entresto with marked hypotension. Poor choice for him with age and tendency to fall.  I will put him on lisinopril 2.5 mg qhs, check BMET today and again in 2 wks.    - Continue coreg 3.125 mg BID.  - He would not be a good candidate for advanced therapies given advanced age.  2. CAD: s/p CABG.  No chest pain.  - Continue statin and ASA 81 daily.  3. VT: ATP 9/17 was successful.  No VT since then.   4. Tricuspid regurgitation: Severe on last echo.   5. CKD III: BMET today.   [name] 08/04/2016

## 2016-08-11 ENCOUNTER — Other Ambulatory Visit: Payer: Self-pay | Admitting: *Deleted

## 2016-08-11 NOTE — Patient Outreach (Signed)
[facility]) Care Management   08/11/2016  [name] May 18, 1927 [id number]  [name] is an 82 y.o. male  Subjective:   Member report that he is doing well.  He denies any shortness of breath or swelling.  He denies any falls or dizziness.    Objective:   Review of Systems  Constitutional: Negative.   HENT: Negative.        Runny nose  Eyes: Negative.   Respiratory: Negative.   Cardiovascular: Negative.   Gastrointestinal: Negative.   Genitourinary: Negative.   Musculoskeletal: Negative.   Neurological: Negative.   Endo/Heme/Allergies: Negative.   Psychiatric/Behavioral: Negative.     Physical Exam  Constitutional: He is oriented to person, place, and time. He appears well-developed and well-nourished.  Neck: Normal range of motion.  Cardiovascular: Normal rate, regular rhythm and normal heart sounds.   Respiratory: Effort normal and breath sounds normal.  GI: Soft. Bowel sounds are normal.  Musculoskeletal: Normal range of motion.  Neurological: He is alert and oriented to person, place, and time.  Skin: Skin is warm and dry.   BP 106/68 (BP Location: Left Arm, Patient Position: Sitting, Cuff Size: Normal)   Pulse 61   Resp 18   Wt 128 lb (58.1 kg)   SpO2 97%   BMI 21.30 kg/m   Encounter Medications:   Outpatient Encounter Prescriptions as of 08/11/2016  Medication Sig  . acetaminophen (TYLENOL) 500 MG tablet Take 1 tablet (500 mg total) by mouth 2 (two) times daily as needed.  Marland Kitchen acidophilus (RISAQUAD) CAPS capsule Take 1 capsule by mouth daily.  Marland Kitchen allopurinol (ZYLOPRIM) 300 MG tablet Take 300 mg by mouth daily.    Marland Kitchen atorvastatin (LIPITOR) 20 MG tablet Take 20 mg by mouth daily at 6 PM.  . carvedilol (COREG) 6.25 MG tablet Take 3.125 mg by mouth 2 (two) times daily with a meal.   . escitalopram (LEXAPRO) 10 MG tablet Take 1 tablet (10 mg total) by mouth daily.  Marland Kitchen esomeprazole (NEXIUM) 20 MG capsule Take 20 mg by mouth every morning.    . famotidine (PEPCID) 20 MG tablet Take 20 mg by mouth daily.   Marland Kitchen ketotifen (ZADITOR) 0.025 % ophthalmic solution Place 1 drop into both eyes daily as needed (dry eyes).   Marland Kitchen lisinopril (PRINIVIL,ZESTRIL) 2.5 MG tablet Take 1 tablet (2.5 mg total) by mouth daily.  Marland Kitchen NITROSTAT 0.4 MG SL tablet Place 0.4 mg under the tongue every 5 (five) minutes as needed for chest pain (MAX 3 TABLETS).   Marland Kitchen oxyCODONE (OXY IR/ROXICODONE) 5 MG immediate release tablet Take 1 tablet (5 mg total) by mouth every 6 (six) hours as needed for moderate pain.  Marland Kitchen PATADAY 0.2 % SOLN Place 1 drop into both eyes daily.   Marland Kitchen RA ASPIRIN ADULT LOW STRENGTH 81 MG EC tablet Take 1 tablet by mouth daily.  Marland Kitchen tiZANidine (ZANAFLEX) 2 MG tablet Take 1 tablet (2 mg total) by mouth every 8 (eight) hours as needed for muscle spasms.  Marland Kitchen torsemide (DEMADEX) 20 MG tablet Take 1 tablet (20 mg total) by mouth daily.   No facility-administered encounter medications on file as of 08/11/2016.     Functional Status:   In your present state of health, do you have any difficulty performing the following activities: 06/12/2016 06/09/2016  Hearing? N N  Vision? N Y  Difficulty concentrating or making decisions? N N  Walking or climbing stairs? Y Y  Dressing or bathing? [name]  Doing errands,  shopping? [name]  Preparing Food and eating ? - Y  Using the Toilet? - N  In the past six months, have you accidently leaked urine? - Y  Do you have problems with loss of bowel control? - N  Managing your Medications? - Y  Managing your Finances? - Y  Housekeeping or managing your Housekeeping? - Y  Some recent data might be hidden    Fall/Depression Screening:    PHQ 2/9 Scores 06/09/2016 05/27/2016  PHQ - 2 Score 1 -  Exception Documentation - Other- indicate reason in comment box  Not completed - screening call completed with caregiver    Assessment:    Met with member at scheduled time.  Caregiver not present, but another friend is present.  Permission  granted to continue visit with friend present.  Member remains compliant with medication management, with the assistance of his caregiver, [name].  His pillbox is in need of being filled today, he report that she will visit later today to refill.  He has increased his compliance with daily weights although he does forget some days, caregiver also assist with reminders.  Member denies no further needs from this care manager, he is unable to verbalize heart failure zones, instead states "it's right there on the wall."  Caregiver was able to verbalize during last conversation.  Discussed with member the possibility of transitioning to disease management through telephonic health coach.  He is receptive but would like this care manager to contact his caregiver to confirm.  He denies any questions/cocnerns at this time.  Plan:   Will follow up with caregiver, if no further concerns/needs will place referral to health coach.  [facility] CM Care Plan Problem Two   Flowsheet Row Most Recent Value  Care Plan Problem Two  Risk for hospital readmission related to heart failure as evidenced by recent admission  Role Documenting the Problem Two  Care Management [place] for Problem Two  Not Active  Interventions for Problem Two Long Term Goal   Discussed with member the importance of following discharge instructions, including follow up appointments, medications, diet, and home health involvement, to decrease the risk of readmission  THN Long Term Goal (31-90) days  Member will not be readmitted to hospital within 60 days of discharge  [facility] Long Term Goal Start Date  06/16/16  [facility] Long Term Goal Met Date  08/11/16  THN CM Short Term Goal #1 (0-30 days)  Member will weigh self daily over the next 4 weeks  THN CM Short Term Goal #1 Start Date  07/21/16  [facility] CM Short Term Goal #1 Met Date   08/11/16  Interventions for Short Term Goal #2   Edcuated member on the importance of daily weights as it aids inclose  management of heart failure.  Provided with [facility] calendar tool to include logs for daily weights.  THN CM Short Term Goal #2 (0-30 days)  Member will be able to verbalize yellow zone of heart failure action plan and when to contact MD within the next 4 weeks  THN CM Short Term Goal #2 Start Date  07/21/16  [facility] CM Short Term Goal #2 Met Date  08/11/16  Interventions for Short Term Goal #2  Educated member and caregiver on heart failure zones.  [facility] calendar with action plan provided.       Valente David, [place], MSN [place] [phone]

## 2016-08-12 ENCOUNTER — Other Ambulatory Visit: Payer: Self-pay | Admitting: Internal Medicine

## 2016-08-16 ENCOUNTER — Ambulatory Visit (HOSPITAL_COMMUNITY)
Admission: RE | Admit: 2016-08-16 | Discharge: 2016-08-16 | Disposition: A | Discharge status: Home / Self Care | Payer: Medicare Other | Source: Ambulatory Visit | Attending: Cardiology | Admitting: Cardiology

## 2016-08-16 DIAGNOSIS — I5022 Chronic systolic (congestive) heart failure: Secondary | ICD-10-CM | POA: Diagnosis present

## 2016-08-16 LAB — BASIC METABOLIC PANEL
ANION GAP: 9 (ref 5–15)
BUN: 29 mg/dL — AB (ref 6–20)
CHLORIDE: 102 mmol/L (ref 101–111)
CO2: 27 mmol/L (ref 22–32)
Calcium: 9.4 mg/dL (ref 8.9–10.3)
Creatinine, Ser: 1.52 mg/dL — ABNORMAL HIGH (ref 0.61–1.24)
GFR calc Af Amer: 46 mL/min — ABNORMAL LOW (ref >60)
GFR calc non Af Amer: 39 mL/min — ABNORMAL LOW (ref >60)
GLUCOSE: 158 mg/dL — AB (ref 65–99)
POTASSIUM: 4.2 mmol/L (ref 3.5–5.1)
SODIUM: 138 mmol/L (ref 135–145)

## 2016-08-17 ENCOUNTER — Other Ambulatory Visit: Payer: Self-pay | Admitting: *Deleted

## 2016-08-17 NOTE — Patient Outreach (Addendum)
[facility]) Care Management  08/17/2016  [name] 05/17/1927 [id number]   Call placed to caregiver, [name], to follow up on last week's home visit and to inquire about further needs/THN involvement.  No answer, HIPAA compliant voice message left.  Will await call back.  If no call back, will follow up next week.  Update @ 0935: Call received back from [name].  She state the member is doing well, weight has been stable with current treatment. He had labs done yesterday and has follow up with heart clinic in January.  She denies any further needs for community care manager at this time, open to ongoing involvement with health coach.  Will close case to community case management and place referral to health coach.  She is advised to inform health coach if member's health status change and he is in need of a home visit.  She verbalizes understanding.  Valente David, [place], MSN [place] [phone]

## 2016-08-18 NOTE — Addendum Note (Signed)
Addended byValente David on: 08/18/2016 05:14 PM   Modules accepted: Orders

## 2016-08-19 ENCOUNTER — Encounter: Payer: Self-pay | Admitting: *Deleted

## 2016-08-23 ENCOUNTER — Telehealth: Payer: Self-pay

## 2016-08-23 ENCOUNTER — Ambulatory Visit (INDEPENDENT_AMBULATORY_CARE_PROVIDER_SITE_OTHER): Payer: Medicare Other

## 2016-08-23 DIAGNOSIS — I5022 Chronic systolic (congestive) heart failure: Secondary | ICD-10-CM

## 2016-08-23 DIAGNOSIS — Z95810 Presence of automatic (implantable) cardiac defibrillator: Secondary | ICD-10-CM

## 2016-08-23 NOTE — Progress Notes (Signed)
EPIC Encounter for ICM Monitoring  Patient Name: [name] is a 82 y.o. male Date: 08/23/2016 Primary Care Physican: [name], MD Primary Cardiologist:Harwani/McLean  Electrophysiologist: Druscilla Brownie Weight:unknown  Bi-V Pacing: 94%       Attempted ICM call to [name], (DPR) and unable to reach.  Left detailed message regarding transmission.  Transmission reviewed.   Thoracic impedance normal   Recommendations: Left message to call for fluid symptoms  Follow-up plan: ICM clinic phone appointment on 10/14/2016 and office appointment with HF clinic 09/08/2016 and Dr [name] 09/13/2016.  Copy of ICM check sent to device physician.   ICM trend: 08/23/2016       [name], RN 08/23/2016 8:12 AM

## 2016-08-23 NOTE — Telephone Encounter (Signed)
Remote ICM transmission received.  Attempted call to [name], friend (DPR) and left detailed message regarding transmission and next ICM scheduled for 10/14/2016 and patient has office appointment with Dr [name] 09/13/2016.  Advised to return call for any fluid symptoms or questions.

## 2016-09-08 ENCOUNTER — Ambulatory Visit (HOSPITAL_COMMUNITY)
Admission: RE | Admit: 2016-09-08 | Discharge: 2016-09-08 | Disposition: A | Discharge status: Home / Self Care | Payer: Medicare Other | Source: Ambulatory Visit | Attending: Internal Medicine | Admitting: Internal Medicine

## 2016-09-08 ENCOUNTER — Encounter (HOSPITAL_COMMUNITY): Payer: Self-pay

## 2016-09-08 VITALS — BP 112/78 | HR 65 | Wt 131.2 lb

## 2016-09-08 DIAGNOSIS — I959 Hypotension, unspecified: Secondary | ICD-10-CM | POA: Diagnosis not present

## 2016-09-08 DIAGNOSIS — I071 Rheumatic tricuspid insufficiency: Secondary | ICD-10-CM | POA: Insufficient documentation

## 2016-09-08 DIAGNOSIS — I5022 Chronic systolic (congestive) heart failure: Secondary | ICD-10-CM

## 2016-09-08 DIAGNOSIS — I130 Hypertensive heart and chronic kidney disease with heart failure and stage 1 through stage 4 chronic kidney disease, or unspecified chronic kidney disease: Secondary | ICD-10-CM | POA: Diagnosis not present

## 2016-09-08 DIAGNOSIS — E785 Hyperlipidemia, unspecified: Secondary | ICD-10-CM | POA: Diagnosis not present

## 2016-09-08 DIAGNOSIS — M109 Gout, unspecified: Secondary | ICD-10-CM | POA: Diagnosis not present

## 2016-09-08 DIAGNOSIS — I4892 Unspecified atrial flutter: Secondary | ICD-10-CM | POA: Diagnosis not present

## 2016-09-08 DIAGNOSIS — Z951 Presence of aortocoronary bypass graft: Secondary | ICD-10-CM | POA: Diagnosis not present

## 2016-09-08 DIAGNOSIS — I472 Ventricular tachycardia, unspecified: Secondary | ICD-10-CM

## 2016-09-08 DIAGNOSIS — I2510 Atherosclerotic heart disease of native coronary artery without angina pectoris: Secondary | ICD-10-CM | POA: Insufficient documentation

## 2016-09-08 DIAGNOSIS — Z95810 Presence of automatic (implantable) cardiac defibrillator: Secondary | ICD-10-CM

## 2016-09-08 DIAGNOSIS — Z7982 Long term (current) use of aspirin: Secondary | ICD-10-CM | POA: Insufficient documentation

## 2016-09-08 DIAGNOSIS — I255 Ischemic cardiomyopathy: Secondary | ICD-10-CM | POA: Insufficient documentation

## 2016-09-08 DIAGNOSIS — K219 Gastro-esophageal reflux disease without esophagitis: Secondary | ICD-10-CM | POA: Insufficient documentation

## 2016-09-08 DIAGNOSIS — N183 Chronic kidney disease, stage 3 (moderate): Secondary | ICD-10-CM | POA: Insufficient documentation

## 2016-09-08 NOTE — Progress Notes (Signed)
Advanced Heart Failure Clinic Note   PCP: Dr. Ardeth Perfect HF Cardiology: Dr. Aundra Dubin  83 yo with CAD s/p CABG and chronic systolic CHF from ischemic cardiomyopathy presents for HF clinic evaluation.  He has a Medtronic CRT-D device.  Echo in 11/15 showed EF 20-25%.  He became gradually more symptomatic over the last 2 months.  He normally walks with a cane or walker and can only get about 30-40 feet before getting short of breath. He had VT x 2 in 9/17 terminated by ATP.  At last appointment, he had been on daily metolazone + Lasix.  I transitioned him to torsemide 80 mg daily + metolazone only once a week.  Entresto was increased to 24/26 bid from 1/2 tablet 24/26 bid, BP was high at the time.   Admitted from clinic 06/10/16 with orthostasis and 24 lb weight loss after recent switch from lasix to torsemide.  Optivol with fluid index well below threshold and impedance continuing up.  With orthostasis, light headedness, and living alone pt was admitted for dehydration. AKI noted on labwork. Diuretics held on admission and given IV fluid. Entresto stopped. Dizziness/orthostasis improved over weekend.  Torsemide resumed 06/13/16, then cut back further on 10/10 and again on 06/15/16 for uptrend in BUN.  Repeat Echo (10/17) showed EF 35-40%, mildly dilated RV with mild to moderately decreased systolic function, severe TR, PASP 48 mmHg.   He returns for HF follow up with his POA. Last visit he was started on lisinopril. Overall feeling pretty good. Mild dyspnea with exertion. Denies PND/Orthopnea. Taking all medications. Appetite ok.   Labs (9/17): creatinine 1.13, K 3.4, hgb 9.4, BNP 2230 => 2270 Labs (10/17): K 4.1, creatinine 1.56  Labs (11/17): K 3.9, creatinine 1.36, BNP 1183  PMH: 1. Atrial flutter: Paroxysmal. 2. PVCs, h/o VT.  3. Chronic systolic CHF: Ischemic cardiomyopathy.  Medtronic CRT-D.   - Echo (11/15): EF 20-25%, mildly dilated LV.   - Echo (10/17) showed EF 35-40%, mildly dilated RV with  mild to moderately decreased systolic function, severe TR, PASP 48 mmHg. 4. H/o SBO 5. HTN 6. Gout  7. Hyperlipidemia 8. GERD 9. CAD: s/p CABG in 5/12 with LIMA-LAD, SVG-ramus, SVG-D1, SVG-PDA. - PCI to OM1 and proximal LCx in 8/12.   SH: Nonsmoker, lives in apartment, no ETOH.    Family History  Problem Relation Age of Onset  . Heart Problems Mother   . Diabetes Mother   . Heart Problems Brother    ROS: All systems reviewed and negative except as per HPI.   Current Outpatient Prescriptions  Medication Sig Dispense Refill  . acetaminophen (TYLENOL) 500 MG tablet Take 1 tablet (500 mg total) by mouth 2 (two) times daily as needed. 30 tablet   . acidophilus (RISAQUAD) CAPS capsule Take 1 capsule by mouth daily.    Marland Kitchen allopurinol (ZYLOPRIM) 300 MG tablet Take 300 mg by mouth daily.      Marland Kitchen atorvastatin (LIPITOR) 20 MG tablet Take 20 mg by mouth daily at 6 PM.    . carvedilol (COREG) 6.25 MG tablet Take 3.125 mg by mouth 2 (two) times daily with a meal.     . escitalopram (LEXAPRO) 10 MG tablet Take 1 tablet (10 mg total) by mouth daily.  0  . esomeprazole (NEXIUM) 20 MG capsule Take 20 mg by mouth every morning.     . famotidine (PEPCID) 20 MG tablet Take 20 mg by mouth daily.     Marland Kitchen ketotifen (ZADITOR) 0.025 % ophthalmic solution Place 1  drop into both eyes daily as needed (dry eyes).     Marland Kitchen lisinopril (PRINIVIL,ZESTRIL) 2.5 MG tablet Take 1 tablet (2.5 mg total) by mouth daily. 30 tablet 3  . NITROSTAT 0.4 MG SL tablet Place 0.4 mg under the tongue every 5 (five) minutes as needed for chest pain (MAX 3 TABLETS).     Marland Kitchen oxyCODONE (OXY IR/ROXICODONE) 5 MG immediate release tablet Take 1 tablet (5 mg total) by mouth every 6 (six) hours as needed for moderate pain. 30 tablet 0  . PATADAY 0.2 % SOLN Place 1 drop into both eyes daily.     Marland Kitchen RA ASPIRIN ADULT LOW STRENGTH 81 MG EC tablet Take 1 tablet by mouth daily.  0  . tiZANidine (ZANAFLEX) 2 MG tablet Take 1 tablet (2 mg total) by mouth  every 8 (eight) hours as needed for muscle spasms. 30 tablet 0  . torsemide (DEMADEX) 20 MG tablet Take 1 tablet (20 mg total) by mouth daily. 90 tablet 3   No current facility-administered medications for this encounter.    BP 112/78 (BP Location: Left Arm)   Pulse 65   Wt 131 lb 3.2 oz (59.5 kg)   SpO2 94%   BMI 21.83 kg/m    Wt Readings from Last 3 Encounters:  09/08/16 131 lb 3.2 oz (59.5 kg)  08/11/16 128 lb (58.1 kg)  08/02/16 131 lb 12.8 oz (59.8 kg)    General: NAD, Elderly appearing. Ambulated in the clinic.  HEENT: Normal.  Neck: Supple, JVP flat no thyromegaly or thyroid nodule.  Lungs: CTAB, normal effort CV: Nondisplaced PMI.  Heart regular S1/S2, no S3/S4, 2/6 HSM at LLSB.  No edema.  No carotid bruit.  Normal pedal pulses.  Abdomen: Soft, NT, ND, no HSM. No bruits or masses. +BS  Skin: Intact without lesions or rashes.  Neurologic: Alert and oriented x 3.  Psych: Normal affect, slightly flat Extremities: No clubbing or cyanosis.   Assessment/Plan: 1. Chronic systolic CHF: Ischemic cardiomyopathy.  He has a Medtronic CRT-D device.  Repeat echo 10/17 showed EF up a bit to 35-40%.  NYHA II-III.  Volume status stable. Continue current regimen.  - Continue torsemide 20 mg daily.  - He has failed Entresto with marked hypotension.  _ Continue lisinopril 2.5 mg qhs - Continue coreg 3.125 mg BID.  - He would not be a good candidate for advanced therapies given advanced age.  2. CAD: s/p CABG.  No chest pain.  - Continue statin and ASA 81 daily.  3. VT: ATP 9/17 was successful.  No VT since then.  Has follow up with Dr [name].  4. Tricuspid regurgitation: Severe on last echo.   5. CKD III: Reviewed BMET from 08/16/16. Renal function was stable.   Follow up in 3 months with Dr Aundra Dubin. Next visit consider referral to Palliative Care.    Saaya Procell 09/08/2016

## 2016-09-08 NOTE — Patient Instructions (Signed)
No lab work.  No changes to medication today.  Follow up 3 months with Dr. Aundra Dubin.  Do the following things EVERYDAY: 1) Weigh yourself in the morning before breakfast. Write it down and keep it in a log. 2) Take your medicines as prescribed 3) Eat low salt foods-Limit salt (sodium) to 2000 mg per day.  4) Stay as active as you can everyday 5) Limit all fluids for the day to less than 2 liters

## 2016-09-13 ENCOUNTER — Ambulatory Visit (INDEPENDENT_AMBULATORY_CARE_PROVIDER_SITE_OTHER): Payer: Medicare Other | Admitting: Internal Medicine

## 2016-09-13 ENCOUNTER — Encounter: Payer: Self-pay | Admitting: Internal Medicine

## 2016-09-13 ENCOUNTER — Encounter (INDEPENDENT_AMBULATORY_CARE_PROVIDER_SITE_OTHER): Payer: Self-pay

## 2016-09-13 VITALS — BP 106/64 | HR 60 | Ht 64.0 in | Wt 134.4 lb

## 2016-09-13 DIAGNOSIS — I5022 Chronic systolic (congestive) heart failure: Secondary | ICD-10-CM | POA: Diagnosis not present

## 2016-09-13 DIAGNOSIS — Z95810 Presence of automatic (implantable) cardiac defibrillator: Secondary | ICD-10-CM

## 2016-09-13 DIAGNOSIS — I472 Ventricular tachycardia, unspecified: Secondary | ICD-10-CM

## 2016-09-13 NOTE — Patient Instructions (Addendum)
Medication Instructions:  Your physician recommends that you continue on your current medications as directed. Please refer to the Current Medication list given to you today.   Labwork: None Ordered   Testing/Procedures: None Ordered   Follow-Up: Your physician wants you to follow-up in: 1 year with Dr. Taylor. You will receive a reminder letter in the mail two months in advance. If you don't receive a letter, please call our office to schedule the follow-up appointment.  Remote monitoring is used to monitor your ICD from home. This monitoring reduces the number of office visits required to check your device to one time per year. It allows us to keep an eye on the functioning of your device to ensure it is working properly. You are scheduled for a device check from home on 12/13/16. You may send your transmission at any time that day. If you have a wireless device, the transmission will be sent automatically. After your physician reviews your transmission, you will receive a postcard with your next transmission date.    Any Other Special Instructions Will Be Listed Below (If Applicable).     If you need a refill on your cardiac medications before your next appointment, please call your pharmacy.   

## 2016-09-13 NOTE — Progress Notes (Signed)
HPI [name] returns today for followup. He is a pleasant 83 yo man with a h/o chronic systolic heart failure, CAD, atrial flutter, PVC's s/p ICD insertion. He denies chest pain.He has had trouble with volume overload and was hospitalized back in October. He has done well in the interim. He is having his weight checked daily. He denies medical non-compliance. No ICD shocks.  No Known Allergies   Current Outpatient Prescriptions  Medication Sig Dispense Refill  . acetaminophen (TYLENOL) 500 MG tablet Take 500 mg by mouth 2 (two) times daily as needed for mild pain.    Marland Kitchen acidophilus (RISAQUAD) CAPS capsule Take 1 capsule by mouth daily.    Marland Kitchen allopurinol (ZYLOPRIM) 300 MG tablet Take 300 mg by mouth daily.      Marland Kitchen atorvastatin (LIPITOR) 20 MG tablet Take 20 mg by mouth daily at 6 PM.    . carvedilol (COREG) 6.25 MG tablet Take 3.125 mg by mouth 2 (two) times daily with a meal.     . escitalopram (LEXAPRO) 10 MG tablet Take 1 tablet (10 mg total) by mouth daily.  0  . esomeprazole (NEXIUM) 20 MG capsule Take 20 mg by mouth every morning.     . famotidine (PEPCID) 20 MG tablet Take 20 mg by mouth daily.     Marland Kitchen ketotifen (ZADITOR) 0.025 % ophthalmic solution Place 1 drop into both eyes daily as needed (dry eyes).     Marland Kitchen lisinopril (PRINIVIL,ZESTRIL) 2.5 MG tablet Take 1 tablet (2.5 mg total) by mouth daily. 30 tablet 3  . LORazepam (ATIVAN) 0.5 MG tablet Take 0.5 mg by mouth daily.    Marland Kitchen NITROSTAT 0.4 MG SL tablet Place 0.4 mg under the tongue every 5 (five) minutes as needed for chest pain (MAX 3 TABLETS).     Marland Kitchen oxyCODONE (OXY IR/ROXICODONE) 5 MG immediate release tablet Take 1 tablet (5 mg total) by mouth every 6 (six) hours as needed for moderate pain. 30 tablet 0  . PATADAY 0.2 % SOLN Place 1 drop into both eyes daily.     Marland Kitchen RA ASPIRIN ADULT LOW STRENGTH 81 MG EC tablet Take 1 tablet by mouth daily.  0  . tiZANidine (ZANAFLEX) 2 MG tablet Take 1 tablet (2 mg total) by mouth every 8  (eight) hours as needed for muscle spasms. 30 tablet 0  . torsemide (DEMADEX) 20 MG tablet Take 1 tablet (20 mg total) by mouth daily. 90 tablet 3   No current facility-administered medications for this visit.      Past Medical History:  Diagnosis Date  . AICD (automatic cardioverter/defibrillator) present   . Arthritis    "shoulders" (10/31/2014)  . CAD (coronary artery disease)    s/p CABG; s/p Pacemaker  . Diverticulosis of colon   . GERD (gastroesophageal reflux disease)   . Gout   . Hyperlipidemia   . Hypertension   . Ischemic cardiomyopathy    s/p ICD  . On home oxygen therapy    "2L prn" (10/31/2014)  . Paroxysmal ventricular tachycardia ([place])   . SBO (small bowel obstruction) 10/31/2014  . Shortness of breath dyspnea    with exertion  . Systolic CHF with reduced left ventricular function, NYHA class 2 ([place])     ROS:   All systems reviewed and negative except as noted in the HPI.   Past Surgical History:  Procedure Laterality Date  . BI-VENTRICULAR IMPLANTABLE CARDIOVERTER DEFIBRILLATOR UPGRADE N/A 01/22/2014   Procedure: BI-VENTRICULAR IMPLANTABLE CARDIOVERTER DEFIBRILLATOR UPGRADE;  Surgeon: Evans Lance, MD;  Location[facility] CATH LAB;  Service: Cardiovascular;  Laterality: N/A;  . BOWEL RESECTION    . CARDIAC CATHETERIZATION  03/2011   Archie Endo 01/18/2011  . CARDIAC DEFIBRILLATOR PLACEMENT  07/2003   Archie Endo 01/18/2011  . CATARACT EXTRACTION W/ INTRAOCULAR LENS  IMPLANT, BILATERAL Bilateral   . CATARACT EXTRACTION W/PHACO Right 12/17/2014   Procedure: PHACOEMULSIFICATION CATARACT EXTRACTION WITH IOL IMPLANT RIGHT EYE;  Surgeon: Marylynn Pearson, MD;  Location: [place];  Service: Ophthalmology;  Laterality: Right;  . CHOLECYSTECTOMY    . CORONARY ANGIOPLASTY WITH STENT PLACEMENT  04/2011   2 stents/notes 05/03/2011  . CORONARY ARTERY BYPASS GRAFT  03/2003   CABG X3/notes 01/18/2011  . EYE SURGERY Bilateral    [place]  . IMPLANTABLE CARDIOVERTER DEFIBRILLATOR (ICD) GENERATOR  CHANGE Left 10/12/2011   Procedure: ICD GENERATOR CHANGE;  Surgeon: Evans Lance, MD;  Location[facility] CATH LAB;  Service: Cardiovascular;  Laterality: Left;  . PACEMAKER PLACEMENT    . PACEMAKER REVISION N/A 10/12/2011   Procedure: PACEMAKER REVISION;  Surgeon: Evans Lance, MD;  Location: [facility] CATH LAB;  Service: Cardiovascular;  Laterality: N/A;  . TEE WITH CARDIOVERSION  03/2003   Archie Endo 01/18/2011  . TONSILLECTOMY       Family History  Problem Relation Age of Onset  . Heart Problems Mother   . Diabetes Mother   . Heart Problems Brother      Social History   Social History  . Marital status: Divorced    Spouse name: N/A  . Number of children: N/A  . Years of education: N/A   Occupational History  . Not on file.   Social History Main Topics  . Smoking status: Never Smoker  . Smokeless tobacco: Former Systems developer    Types: Chew     Comment: no chew in over 3 years  . Alcohol use Yes     Comment: "might take a drink during the holidays"  . Drug use: No  . Sexual activity: No   Other Topics Concern  . Not on file   Social History Narrative  . No narrative on file     BP 106/64 (BP Location: Right Arm)   Pulse 60   Ht 5\' 4"  (1.626 m)   Wt 134 lb 6.4 oz (61 kg)   BMI 23.07 kg/m   Physical Exam:  Well appearing 83 yo man, NAD HEENT: Unremarkable Neck:  6 cm JVD, no thyromegally Back:  No CVA tenderness Lungs:  Clear with no wheezes HEART:  Regular rate rhythm, no murmurs, no rubs, no clicks Abd:  soft, positive bowel sounds, no organomegally, no rebound, no guarding Ext:  2 plus pulses, no edema, no cyanosis, no clubbing Skin:  No rashes no nodules Neuro:  CN II through XII intact, motor grossly intact   DEVICE  Normal device function.  See PaceArt for details.   Assess/Plan: 1. Chronic systolic heart failure - his symptoms are now well compensated class 3. He has a low output state but appears to be warm today. He is not volume overloaded on exam. 2. VT - he  has had no additional VT. He will continue his current meds. 3. ICD - his Medtronic BiV ICD is working normal.  4. PAF - he has maintained NSR since May.  [name].D.

## 2016-09-15 ENCOUNTER — Other Ambulatory Visit: Payer: Self-pay

## 2016-09-15 NOTE — Patient Outreach (Signed)
[facility]) Care Management  09/15/2016  [name] 1927/06/12 [id number]   Telephone call to [place] for initial assessment.  No answer.  HIPAA compliant voice message left.  Plan: RN Health Coach will attempt patient again in the month of January.  Jone Baseman, RN, MSN [place] [phone]

## 2016-09-15 NOTE — Patient Outreach (Signed)
[facility]) Care Management  Loop  09/15/2016   YASSIEL ARNDORFER 09-16-1926 [id number]  Subjective: Return call from [facility]. She reports that patient is doing well and that he saw Dr. [name] on yesterday. She reports she helps patient with medications, housekeeping, finances and take patient to his appointments.  She reports that patient has done well with his heart failure.  Discussed with her heart failure zones ands when to notify physician if patient gets into trouble. She verbalized understanding and states that if he has any changes she will be contacting someone. Advised her that his primary nurse Johny Shock would be in contact next month.  Primary nurse contact information given. She verbalized understanding.    Objective:   Encounter Medications:  Outpatient Encounter Prescriptions as of 09/15/2016  Medication Sig Note  . acetaminophen (TYLENOL) 500 MG tablet Take 500 mg by mouth 2 (two) times daily as needed for mild pain.   Marland Kitchen acidophilus (RISAQUAD) CAPS capsule Take 1 capsule by mouth daily.   Marland Kitchen allopurinol (ZYLOPRIM) 300 MG tablet Take 300 mg by mouth daily.     Marland Kitchen atorvastatin (LIPITOR) 20 MG tablet Take 20 mg by mouth daily at 6 PM.   . carvedilol (COREG) 6.25 MG tablet Take 3.125 mg by mouth 2 (two) times daily with a meal.    . escitalopram (LEXAPRO) 10 MG tablet Take 1 tablet (10 mg total) by mouth daily.   Marland Kitchen esomeprazole (NEXIUM) 20 MG capsule Take 20 mg by mouth every morning.    . famotidine (PEPCID) 20 MG tablet Take 20 mg by mouth daily.    Marland Kitchen ketotifen (ZADITOR) 0.025 % ophthalmic solution Place 1 drop into both eyes daily as needed (dry eyes).    Marland Kitchen lisinopril (PRINIVIL,ZESTRIL) 2.5 MG tablet Take 1 tablet (2.5 mg total) by mouth daily.   Marland Kitchen LORazepam (ATIVAN) 0.5 MG tablet Take 0.5 mg by mouth daily. 09/13/2016: Received from: External Pharmacy  . NITROSTAT 0.4 MG SL tablet Place 0.4 mg under the tongue every 5 (five)  minutes as needed for chest pain (MAX 3 TABLETS).    Marland Kitchen oxyCODONE (OXY IR/ROXICODONE) 5 MG immediate release tablet Take 1 tablet (5 mg total) by mouth every 6 (six) hours as needed for moderate pain.   Marland Kitchen PATADAY 0.2 % SOLN Place 1 drop into both eyes daily.    Marland Kitchen RA ASPIRIN ADULT LOW STRENGTH 81 MG EC tablet Take 1 tablet by mouth daily.   Marland Kitchen tiZANidine (ZANAFLEX) 2 MG tablet Take 1 tablet (2 mg total) by mouth every 8 (eight) hours as needed for muscle spasms.   Marland Kitchen torsemide (DEMADEX) 20 MG tablet Take 1 tablet (20 mg total) by mouth daily.    No facility-administered encounter medications on file as of 09/15/2016.     Functional Status:  In your present state of health, do you have any difficulty performing the following activities: 09/15/2016 06/12/2016  Hearing? N N  Vision? Y N  Difficulty concentrating or making decisions? N N  Walking or climbing stairs? Y Y  Dressing or bathing? Y Y  Doing errands, shopping? [name]  Preparing Food and eating ? Y -  Using the Toilet? N -  In the past six months, have you accidently leaked urine? Y -  Do you have problems with loss of bowel control? N -  Managing your Medications? Y -  Managing your Finances? Y -  Housekeeping or managing your Housekeeping? Y -  Some recent  data might be hidden    Fall/Depression Screening: PHQ 2/9 Scores 09/15/2016 06/09/2016 05/27/2016  PHQ - 2 Score 0 1 -  Exception Documentation - - Other- indicate reason in comment box  Not completed - - screening call completed with caregiver    Assessment: Patient/caregiver will benefit from health coach outreach for continued support and assistance with disease management.    Plan:  [facility] CM Care Plan Problem One   Flowsheet Row Most Recent Value  Care Plan Problem One  Knowledge Deficit Heart Failure  Role Documenting the Problem One  [place] for Problem One  Active  THN Long Term Goal (31-90 days)  Patient/caregiver (POA) will continue to be able to  describe heart failure zones and when to contact physician within 90 days.   THN Long Term Goal Start Date  09/15/16  Interventions for Problem One [place] reviewed with caregiver heart failure zones and when to notify physician.      RN Health Coach will provide ongoing education for patient/caregiver on heart failure through phone calls and sending printed information to patient for further discussion. RN Health Coach will welcome letter.  RN Health Coach will send initial barriers letter, assessment, and care plan to primary care physician.  RN Health Coach will contact patient/caregiver in the month of February and patient/caregiver agrees to next contact.   Jone Baseman, RN, MSN [place] [phone]

## 2016-09-20 ENCOUNTER — Encounter: Payer: Self-pay | Admitting: *Deleted

## 2016-09-28 ENCOUNTER — Ambulatory Visit: Payer: Self-pay

## 2016-10-11 ENCOUNTER — Other Ambulatory Visit: Payer: Self-pay | Admitting: Adult Health

## 2016-10-11 DIAGNOSIS — I5022 Chronic systolic (congestive) heart failure: Secondary | ICD-10-CM

## 2016-10-11 DIAGNOSIS — Z95810 Presence of automatic (implantable) cardiac defibrillator: Secondary | ICD-10-CM

## 2016-10-11 DIAGNOSIS — I4729 Other ventricular tachycardia: Secondary | ICD-10-CM

## 2016-10-11 DIAGNOSIS — I255 Ischemic cardiomyopathy: Secondary | ICD-10-CM

## 2016-10-11 DIAGNOSIS — I472 Ventricular tachycardia: Secondary | ICD-10-CM

## 2016-10-13 ENCOUNTER — Other Ambulatory Visit: Payer: Self-pay | Admitting: *Deleted

## 2016-10-14 ENCOUNTER — Telehealth: Payer: Self-pay

## 2016-10-14 ENCOUNTER — Ambulatory Visit (INDEPENDENT_AMBULATORY_CARE_PROVIDER_SITE_OTHER): Payer: Medicare Other

## 2016-10-14 DIAGNOSIS — I5022 Chronic systolic (congestive) heart failure: Secondary | ICD-10-CM

## 2016-10-14 DIAGNOSIS — Z95810 Presence of automatic (implantable) cardiac defibrillator: Secondary | ICD-10-CM

## 2016-10-14 NOTE — Telephone Encounter (Signed)
Remote ICM transmission received.  Attempted patient call and left detailed message regarding transmission and next ICM scheduled for 11/14/2016.  Advised to return call for any fluid symptoms or questions.    

## 2016-10-14 NOTE — Progress Notes (Signed)
EPIC Encounter for ICM Monitoring  Patient Name: [name] is a 83 y.o. male Date: 10/14/2016 Primary Care Physican: [name], MD Primary Cardiologist:Harwani/McLean  Electrophysiologist: Druscilla Brownie Weight:unknown Bi-V Pacing: 95.1%        Attempted call to [name], (DPR) and unable to reach.  Left detailed message regarding transmission.  Transmission reviewed.   Thoracic impedance normal   Recommendations: Left voice mail with ICM number and encouraged to call for fluid symptoms.  Follow-up plan: ICM clinic phone appointment on 11/14/2016.  Copy of ICM check sent to device physician.   3 month ICM trend: 10/13/2016  1 Year ICM trend:      [name], RN 10/14/2016 8:40 AM

## 2016-10-18 NOTE — Patient Outreach (Signed)
[facility]) Care Management  10/18/2016 Late entry  [name] 02-16-1927 [id number]  [place] telephone call to patient.  Hipaa compliance verified.Per patient he is doing pretty good. Patient stated he has no swelling in his legs. Per patient his weight today is 130 pounds. Patient stated that he exercises by walking and stretching 20 minutes a day at home. Patient tries to avoid fried foods. Patient was unable to tell you what zone until you start describing and naming them then he can state the one he is in. Patient has agreed to follow up outreach calls.     Current Medications:  Current Outpatient Prescriptions  Medication Sig Dispense Refill  . acetaminophen (TYLENOL) 500 MG tablet Take 500 mg by mouth 2 (two) times daily as needed for mild pain.    Marland Kitchen acidophilus (RISAQUAD) CAPS capsule Take 1 capsule by mouth daily.    Marland Kitchen allopurinol (ZYLOPRIM) 300 MG tablet Take 300 mg by mouth daily.      Marland Kitchen atorvastatin (LIPITOR) 20 MG tablet Take 20 mg by mouth daily at 6 PM.    . carvedilol (COREG) 6.25 MG tablet Take 3.125 mg by mouth 2 (two) times daily with a meal.     . escitalopram (LEXAPRO) 10 MG tablet Take 1 tablet (10 mg total) by mouth daily.  0  . esomeprazole (NEXIUM) 20 MG capsule Take 20 mg by mouth every morning.     . famotidine (PEPCID) 20 MG tablet Take 20 mg by mouth daily.     Marland Kitchen ketotifen (ZADITOR) 0.025 % ophthalmic solution Place 1 drop into both eyes daily as needed (dry eyes).     Marland Kitchen lisinopril (PRINIVIL,ZESTRIL) 2.5 MG tablet Take 1 tablet (2.5 mg total) by mouth daily. 30 tablet 3  . LORazepam (ATIVAN) 0.5 MG tablet Take 0.5 mg by mouth daily.    Marland Kitchen NITROSTAT 0.4 MG SL tablet Place 0.4 mg under the tongue every 5 (five) minutes as needed for chest pain (MAX 3 TABLETS).     Marland Kitchen oxyCODONE (OXY IR/ROXICODONE) 5 MG immediate release tablet Take 1 tablet (5 mg total) by mouth every 6 (six) hours as needed for moderate pain. 30 tablet 0  . PATADAY 0.2  % SOLN Place 1 drop into both eyes daily.     Marland Kitchen RA ASPIRIN ADULT LOW STRENGTH 81 MG EC tablet Take 1 tablet by mouth daily.  0  . tiZANidine (ZANAFLEX) 2 MG tablet Take 1 tablet (2 mg total) by mouth every 8 (eight) hours as needed for muscle spasms. 30 tablet 0  . torsemide (DEMADEX) 20 MG tablet Take 1 tablet (20 mg total) by mouth daily. 90 tablet 3   No current facility-administered medications for this visit.     Functional Status:  In your present state of health, do you have any difficulty performing the following activities: 10/13/2016 09/15/2016  Hearing? N N  Vision? Y Y  Difficulty concentrating or making decisions? N N  Walking or climbing stairs? Y Y  Dressing or bathing? Y Y  Doing errands, shopping? [name]  Preparing Food and eating ? Y Y  Using the Toilet? N N  In the past six months, have you accidently leaked urine? Y Y  Do you have problems with loss of bowel control? N N  Managing your Medications? Y Y  Managing your Finances? [name]  Housekeeping or managing your Housekeeping? Y Y  Some recent data might be hidden    Fall/Depression Screening: PHQ  2/9 Scores 10/13/2016 09/15/2016 06/09/2016 05/27/2016  PHQ - 2 Score 0 0 1 -  Exception Documentation - - - Other- indicate reason in comment box  Not completed - - - screening call completed with caregiver   [facility] CM Care Plan Problem One   Flowsheet Row Most Recent Value  Care Plan Problem One  Knowledge Deficit Heart Failure  Role Documenting the Problem One  [place] for Problem One  Active  THN Long Term Goal (31-90 days)  Patient/caregiver (POA) will continue to be able to describe heart failure zones and when to contact physician within 90 days.   THN Long Term Goal Start Date  10/13/16  Interventions for Problem One [place] reviewed with caregiver heart failure zones and when to notify physician.   THN CM Short Term Goal #1 (0-30 days)  Patient will report weighing daily and  documenting weights within the next 30 days  THN CM Short Term Goal #1 Start Date  10/13/16  Interventions for Short Term Goal #1  Educated member and POA the importance of  monitoring  daily weights.  RN sent educational material on daily weighing and what it means to heart failure  THN CM Short Term Goal #2 (0-30 days)  RN discussed eating healthy low sodium diet with CHF  THN CM Short Term Goal #2 Start Date  10/13/16  Interventions for Short Term Goal #2  RN sent educational material on low sodium diet. RN will follow up with discussion and teach back.      Assessment:  Patient will benefit from Health Coach telephonic outreach for education and support for congestive heart failure self management.  Plan:  RN reiterated the zones RN sent educational material on low sodium diet RN sent educational material on daily weights RN will follow up within the month of March  Tashira Torre [place] Management [phone]

## 2016-10-20 ENCOUNTER — Encounter: Payer: Self-pay | Admitting: *Deleted

## 2016-10-20 NOTE — Progress Notes (Signed)
[name] returned call.  Patient is feeling much better and has more energy. Could hear patient in the background talking about how good he is feeling.  No fluid symptoms and next ICM remote transmission 11/14/2016 and advised to call for any fluid symptoms.

## 2016-11-07 ENCOUNTER — Telehealth (HOSPITAL_COMMUNITY): Payer: Self-pay

## 2016-11-07 NOTE — Telephone Encounter (Signed)
Patient's daughter left VM on CHF clinic triage line reporting patient's weight is up from baseline (131 lbs) to 138 lbs today. Daughter reports on VM that starting yesterday they "went up on his torsemide to every other day", however, his chart states he should be on 20 mg torsemide once every day. Attempted to return call, no answer, left VM to return call to [place] to clarify what patient has been doing.  Renee Pain, RN

## 2016-11-14 ENCOUNTER — Ambulatory Visit (INDEPENDENT_AMBULATORY_CARE_PROVIDER_SITE_OTHER): Payer: Medicare Other

## 2016-11-14 DIAGNOSIS — I5022 Chronic systolic (congestive) heart failure: Secondary | ICD-10-CM | POA: Diagnosis not present

## 2016-11-14 DIAGNOSIS — Z95810 Presence of automatic (implantable) cardiac defibrillator: Secondary | ICD-10-CM

## 2016-11-15 ENCOUNTER — Other Ambulatory Visit: Payer: Self-pay | Admitting: *Deleted

## 2016-11-15 NOTE — Progress Notes (Signed)
EPIC Encounter for ICM Monitoring  Patient Name: [name] is a 83 y.o. male Date: 11/15/2016 Primary Care Physican: [name], MD Primary Cardiologist:Harwani/McLean  Electrophysiologist: Druscilla Brownie Weight:unknown Bi-V Pacing: 95.1%                                                   Attempted call to [name], (DPR) and unable to reach.    Thoracic impedance abnormal suggesting fluid accumulation since 10/24/2016.  Prescribed dosage: Torsemide 39m 1 tablet daily  Labs: 08/16/2016 Creatinine 1.52, BUN 29, Potassium 4.2, Sodium 138, EGFR 39-46 08/02/2016 Creatinine 1.63, BUN 30, Potassium 4.1, Sodium 138, EGFR 36-42  07/20/2016 Creatinine 1.36, BUN 33, Potassium 3.9, Sodium 139, EGFR 45-52  07/06/2016 Creatinine 1.01, BUN 17, Potassium 4.7, Sodium 139, EGFR >60  06/21/2016 Creatinine 1.44, BUN 40, Potassium 4.3, Sodium 141, EGFR 42-49  06/15/2016 Creatinine 1.76, BUN 55, Potassium 4.2, Sodium 135, EGFR 33-38   Recommendations: NONE - Unable to reach patient   Follow-up plan: ICM clinic phone appointment on 11/21/2016.  Copy of ICM check sent to primary cardiologist and device physician.   3 month ICM trend: 11/14/2016   1 Year ICM trend:      [name] Billings RN 11/15/2016 3:50 PM

## 2016-11-15 NOTE — Patient Outreach (Signed)
[name]) Care Management  11/15/2016   [name] 03/30/1927 [id number]  RN Health Coach telephone call to patient.  Hipaa compliance verified. Per patient he is doing good. Patient stated that he is not in any pain. He is eating very well. Per patient he is not eating any salt. Discussed with patient about decreasing the sodium intake. Per patient he is weighing every day. His weight is 138 pounds. Patient stated he has no swelling in lower extremities or coughing. No shortness of breath on exertion.  RN concerned about weight gain and called caregiver. Per [name] she had doubled up on the patient torsemide every other day. She also stated he has no swelling, coughing or shortness of breath. She stated that his appetite is really good and that the patient keeps talking about gaining his weight back up to 150 pounds. Per caregiver she stated she has called the Dr and left a message. I explained that she needs to call the Dr office back and let them know. Also make them aware his appetite has picked up and he is eating very good. Let them know he has no symptoms. She was questioning whether to double the torsemide every day. RN Health Coach made her aware to talk with Dr because it may not be fluid weight gain and she didn't want to diurese him too much. Patient and caregiver agreed to outreach calls.   Current Medications:  Current Outpatient Prescriptions  Medication Sig Dispense Refill  . acetaminophen (TYLENOL) 500 MG tablet Take 500 mg by mouth 2 (two) times daily as needed for mild pain.    Marland Kitchen acidophilus (RISAQUAD) CAPS capsule Take 1 capsule by mouth daily.    Marland Kitchen allopurinol (ZYLOPRIM) 300 MG tablet Take 300 mg by mouth daily.      Marland Kitchen atorvastatin (LIPITOR) 20 MG tablet Take 20 mg by mouth daily at 6 PM.    . carvedilol (COREG) 6.25 MG tablet Take 3.125 mg by mouth 2 (two) times daily with a meal.     . escitalopram (LEXAPRO) 10 MG tablet Take 1 tablet (10 mg  total) by mouth daily.  0  . esomeprazole (NEXIUM) 20 MG capsule Take 20 mg by mouth every morning.     . famotidine (PEPCID) 20 MG tablet Take 20 mg by mouth daily.     Marland Kitchen ketotifen (ZADITOR) 0.025 % ophthalmic solution Place 1 drop into both eyes daily as needed (dry eyes).     Marland Kitchen lisinopril (PRINIVIL,ZESTRIL) 2.5 MG tablet Take 1 tablet (2.5 mg total) by mouth daily. 30 tablet 3  . LORazepam (ATIVAN) 0.5 MG tablet Take 0.5 mg by mouth daily.    Marland Kitchen NITROSTAT 0.4 MG SL tablet Place 0.4 mg under the tongue every 5 (five) minutes as needed for chest pain (MAX 3 TABLETS).     Marland Kitchen oxyCODONE (OXY IR/ROXICODONE) 5 MG immediate release tablet Take 1 tablet (5 mg total) by mouth every 6 (six) hours as needed for moderate pain. 30 tablet 0  . PATADAY 0.2 % SOLN Place 1 drop into both eyes daily.     Marland Kitchen RA ASPIRIN ADULT LOW STRENGTH 81 MG EC tablet Take 1 tablet by mouth daily.  0  . tiZANidine (ZANAFLEX) 2 MG tablet Take 1 tablet (2 mg total) by mouth every 8 (eight) hours as needed for muscle spasms. 30 tablet 0  . torsemide (DEMADEX) 20 MG tablet Take 1 tablet (20 mg total) by mouth daily. 90 tablet 3  No current facility-administered medications for this visit.     Functional Status:  In your present state of health, do you have any difficulty performing the following activities: 11/15/2016 10/13/2016  Hearing? N N  Vision? Y Y  Difficulty concentrating or making decisions? N N  Walking or climbing stairs? Y Y  Dressing or bathing? Y Y  Doing errands, shopping? [name]  Preparing Food and eating ? Y Y  Using the Toilet? N N  In the past six months, have you accidently leaked urine? Y Y  Do you have problems with loss of bowel control? N N  Managing your Medications? Y Y  Managing your Finances? [name]  Housekeeping or managing your Housekeeping? [name]  Some recent data might be hidden    Fall/Depression Screening: PHQ 2/9 Scores 11/15/2016 10/13/2016 09/15/2016 06/09/2016 05/27/2016  PHQ - 2 Score 0 0 0 1 -   Exception Documentation - - - - Other- indicate reason in comment box  Not completed - - - - screening call completed with caregiver   [facility] CM Care Plan Problem One   Flowsheet Row Most Recent Value  Care Plan Problem One  Knowledge Deficit Heart Failure  Role Documenting the Problem One  [name] for Problem One  Active  THN Long Term Goal (31-90 days)  Patient/caregiver (POA) will continue to be able to describe heart failure zones and when to contact physician within 90 days.   THN Long Term Goal Start Date  11/15/16  Interventions for Problem One [place] reviewed with caregiver heart failure zones and when to notify physician.   THN CM Short Term Goal #1 (0-30 days)  Patient will report weighing daily and documenting weights within the next 30 days  THN CM Short Term Goal #1 Start Date  11/15/16  Interventions for Short Term Goal #1  Educated member and POA the importance of  monitoring  daily weights.  RN sent educational material on daily weighing and what it means to heart failure  THN CM Short Term Goal #2 (0-30 days)  RN discussed eating healthy low sodium diet with CHF  THN CM Short Term Goal #2 Start Date  11/15/16  Interventions for Short Term Goal #2  RN sent educational material on low sodium diet. RN will follow up with discussion and teach back.      Assessment:  Patient has weight gain from last outreach 130 pounds to 138 pounds Patient appetite has increased Patient is not having any symptoms of Congestive Heart Failure Patient will benefit from [place] telephonic outreach for education and support for Congestive self management. Plan:  RN discussed with patient caregiver about weight gain RN instructed caregiver to follow up patient physician RN discussed signs and symptoms of Congestive Heart Failure RN discussed action plans RN discussed low sodium foods RN will follow up within the month of April  [name] Management [phone]

## 2016-11-16 ENCOUNTER — Telehealth (HOSPITAL_COMMUNITY): Payer: Self-pay

## 2016-11-16 DIAGNOSIS — I5022 Chronic systolic (congestive) heart failure: Secondary | ICD-10-CM

## 2016-11-16 NOTE — Telephone Encounter (Signed)
Patient caregiver calling to report patients legs feel tight but dont look swollen.  States he is slowly gaining weight as he has an increased appetite. Denies SOB, feels good generally without complaints. Has been doubling torsemide every other day for awhile now (ordered 20 mg once daily torsemide), unsure why he has been doing this. Caregiver wanting to know if anything further should be done for leg tightness?  Unsure if this is fluid or just patient gaining natural weight. Next f/u apt in 1 month. Will forward to Dr. Aundra Dubin to advise.  Renee Pain, RN

## 2016-11-17 MED ORDER — TORSEMIDE 20 MG PO TABS: 40.0000 mg | ORAL_TABLET | Freq: Every day | ORAL | 3 refills | Status: DC

## 2016-11-17 NOTE — Telephone Encounter (Signed)
Spoke w/caregiver, she is aware, agreeable and verbalizes understanding.  BMet sch for 3/22 at 2 pm, order placed, med list updated

## 2016-11-17 NOTE — Progress Notes (Signed)
Increase torsemide to 40 mg daily and will need BMET 1 week.  Needs followup sooner.

## 2016-11-17 NOTE — Progress Notes (Signed)
Received voice mail from [name] and attempted call back but no answer.

## 2016-11-17 NOTE — Progress Notes (Signed)
Per epic note from Vilinda Blanks, on 11/16/2016 - she spoke with caregiver and reported patients legs feel tight but dont look swollen , he is slowly gaining weight and patient has been doubling torsemide every other day for awhile now (ordered 20 mg once daily torsemide)

## 2016-11-17 NOTE — Telephone Encounter (Signed)
Increase torsemide to 40 mg daily with BMET in 1 week.

## 2016-11-18 NOTE — Progress Notes (Signed)
Caregiver notified of Dr Oleh Genin recommendations by Vilinda Blanks RN (see phone note).

## 2016-11-21 ENCOUNTER — Ambulatory Visit (INDEPENDENT_AMBULATORY_CARE_PROVIDER_SITE_OTHER): Payer: Medicare Other

## 2016-11-21 DIAGNOSIS — I5022 Chronic systolic (congestive) heart failure: Secondary | ICD-10-CM

## 2016-11-21 DIAGNOSIS — Z95810 Presence of automatic (implantable) cardiac defibrillator: Secondary | ICD-10-CM | POA: Diagnosis not present

## 2016-11-21 NOTE — Progress Notes (Signed)
EPIC Encounter for ICM Monitoring  Patient Name: [name] is a 83 y.o. male Date: 11/21/2016 Primary Care Physican: [name], MD Primary Cardiologist:Harwani/McLean  Electrophysiologist: [name] Dry Weight:137 lbs Bi-V Pacing:  94.6%       Spoke with [name], (DPR)and she stated patient is feeling fine.  He has been urinating a lot since the increase in Torsemide.     Thoracic impedance improved but and trending toward baseline since increase in Torsemide.  Impedance trending toward baseline  Prescribed dosage: Torsemide 47m 2 tablet (40 mg total) daily (increased on 11/17/16)  Labs: 08/16/2016 Creatinine 1.52, BUN 29, Potassium 4.2, Sodium 138, EGFR 39-46 08/02/2016 Creatinine 1.63, BUN 30, Potassium 4.1, Sodium 138, EGFR 36-42  07/20/2016 Creatinine 1.36, BUN 33, Potassium 3.9, Sodium 139, EGFR 45-52  07/06/2016 Creatinine 1.01, BUN 17, Potassium 4.7, Sodium 139, EGFR >60  06/21/2016 Creatinine 1.44, BUN 40, Potassium 4.3, Sodium 141, EGFR 42-49  06/15/2016 Creatinine 1.76, BUN 55, Potassium 4.2, Sodium 135, EGFR 33-38    Recommendations: No changes. Reminded to limit dietary salt intake to 2000 mg/day and fluid intake to < 2 liters/day. Encouraged to call for fluid symptoms.  Follow-up plan: ICM clinic phone appointment on 12/06/2016.  Copy of ICM check sent to primary cardiologist and device physician.   3 month ICM trend: 11/21/2016   1 Year ICM trend:      LRosalene Billings RN 11/21/2016 2:12 PM

## 2016-11-24 ENCOUNTER — Ambulatory Visit (HOSPITAL_COMMUNITY)
Admission: RE | Admit: 2016-11-24 | Discharge: 2016-11-24 | Disposition: A | Discharge status: Home / Self Care | Payer: Medicare Other | Source: Ambulatory Visit | Attending: Internal Medicine | Admitting: Internal Medicine

## 2016-11-24 ENCOUNTER — Other Ambulatory Visit (HOSPITAL_COMMUNITY): Payer: Medicare Other

## 2016-11-24 DIAGNOSIS — I5022 Chronic systolic (congestive) heart failure: Secondary | ICD-10-CM | POA: Insufficient documentation

## 2016-11-24 LAB — BASIC METABOLIC PANEL
ANION GAP: 10 (ref 5–15)
BUN: 53 mg/dL — ABNORMAL HIGH (ref 6–20)
CALCIUM: 9.3 mg/dL (ref 8.9–10.3)
CO2: 27 mmol/L (ref 22–32)
Chloride: 99 mmol/L — ABNORMAL LOW (ref 101–111)
Creatinine, Ser: 1.95 mg/dL — ABNORMAL HIGH (ref 0.61–1.24)
GFR calc Af Amer: 34 mL/min — ABNORMAL LOW (ref >60)
GFR, EST NON AFRICAN AMERICAN: 29 mL/min — AB (ref >60)
Glucose, Bld: 106 mg/dL — ABNORMAL HIGH (ref 65–99)
Potassium: 4.3 mmol/L (ref 3.5–5.1)
Sodium: 136 mmol/L (ref 135–145)

## 2016-11-25 ENCOUNTER — Telehealth (HOSPITAL_COMMUNITY): Payer: Self-pay | Admitting: *Deleted

## 2016-11-25 MED ORDER — TORSEMIDE 20 MG PO TABS: 40.0000 mg | ORAL_TABLET | ORAL | 3 refills | Status: DC

## 2016-11-25 NOTE — Telephone Encounter (Signed)
Notes recorded by Harvie Junior, [place] on 11/25/2016 at 2:59 PM EDT Pt aware of lab results and medication change. ------  Notes recorded by Larey Dresser, MD on 11/24/2016 at 9:29 PM EDT Higher BUN and creatinine, decrease torsemide to every other day.    Ref Range & Units 1d ago (11/24/16) 52mo ago (08/16/16) 71mo ago (08/02/16)   Sodium 135 - 145 mmol/L 136  138  138    Potassium 3.5 - 5.1 mmol/L 4.3  4.2  4.1    Chloride 101 - 111 mmol/L 99   102  104    CO2 22 - 32 mmol/L 27  27  28     Glucose, Bld 65 - 99 mg/dL 106   158   116     BUN 6 - 20 mg/dL 53   29   30     Creatinine, Ser 0.61 - 1.24 mg/dL 1.95   1.52   1.63     Calcium 8.9 - 10.3 mg/dL 9.3  9.4  9.4    GFR calc non Af Amer >60 mL/min 29   39   36     GFR calc Af Amer >60 mL/min 34   46CM   42CM    Comments: (NOTE)

## 2016-11-25 NOTE — Telephone Encounter (Signed)
-----   Message from Larey Dresser, MD sent at 11/24/2016  9:29 PM EDT ----- Higher BUN and creatinine, decrease torsemide to every other day.

## 2016-11-26 ENCOUNTER — Other Ambulatory Visit (HOSPITAL_COMMUNITY): Payer: Self-pay | Admitting: Cardiology

## 2016-12-06 ENCOUNTER — Ambulatory Visit: Payer: Medicare Other

## 2016-12-06 DIAGNOSIS — Z95810 Presence of automatic (implantable) cardiac defibrillator: Secondary | ICD-10-CM

## 2016-12-06 DIAGNOSIS — I5022 Chronic systolic (congestive) heart failure: Secondary | ICD-10-CM

## 2016-12-06 NOTE — Progress Notes (Signed)
EPIC Encounter for ICM Monitoring  Patient Name: [name] is a 83 y.o. male Date: 12/06/2016 Primary Care Physican: [name], MD Primary Cardiologist:Harwani/McLean  Electrophysiologist: Druscilla Brownie Weight:[place] Bi-V Pacing:  96.1%         Attempted call to friend [name] [facility]) and unable to reach.  Left detailed message regarding transmission.  Transmission reviewed.    Thoracic impedance returned to normal after Torsemide increase 3/15.  Prescribed dosage: Torsemide changed to '20mg'$  2 tablet (40 mg total) every other day (see lab result note 11/24/2016)  Labs: 11/24/2016 Creatinine 1.95, BUN 53, Potassium 4.3, Sodium 136, EGFR 29-34 12/12/2017Creatinine 1.52, BUN 29, Potassium 4.2, Sodium 138, EGFR 39-46 11/28/2017Creatinine 1.63, BUN 30, Potassium 4.1, Sodium 138, EGFR 36-42  11/15/2017Creatinine 1.36, BUN 33, Potassium 3.9, Sodium 139, EGFR 45-52  11/01/2017Creatinine 1.01, BUN 17, Potassium 4.7, Sodium 139, EGFR >60  06/21/2016 Creatinine 1.44, BUN 40, Potassium 4.3, Sodium 141, EGFR 42-49  06/15/2016 Creatinine 1.76, BUN 55, Potassium 4.2, Sodium 135, EGFR 33-38   Recommendations: NONE - Unable to reach patient   Follow-up plan: ICM clinic phone appointment on 12/26/2016.   HF clinic appointment 12/21/2016  Copy of ICM check sent to primary cardiologist and device physician.   3 month ICM trend: 12/06/2016   1 Year ICM trend:      [name], RN 12/06/2016 8:33 AM

## 2016-12-13 ENCOUNTER — Other Ambulatory Visit: Payer: Self-pay | Admitting: *Deleted

## 2016-12-13 NOTE — Patient Outreach (Signed)
[facility]) Care Management  12/13/2016  [name] [birth year] [phone]  RN Health Coach telephone call to patient.  Hipaa compliance verified.Patient stated he felt real good. Per patient stated the scale said he weighed 140 today and 120 yesterday. The caregiver had just came in to see patient. She is going to check the scale to see if batteries are low. Patient is weighing himself daily. Caregiver stated she has not evaluated his legs since she just got there. Caregiver is trying to work on the phones because patient not hearing well.  Patient is going to the cardiologist on the 12/21/2016. RN will call back on 0419/2018.Patient and caregiver have agreed to follow up outreach call.  [place] [facility] [phone]

## 2016-12-21 ENCOUNTER — Encounter (HOSPITAL_COMMUNITY): Payer: Medicare Other

## 2016-12-22 ENCOUNTER — Ambulatory Visit: Payer: Self-pay | Admitting: *Deleted

## 2016-12-26 ENCOUNTER — Ambulatory Visit (INDEPENDENT_AMBULATORY_CARE_PROVIDER_SITE_OTHER): Payer: Medicare Other | Admitting: *Deleted

## 2016-12-26 ENCOUNTER — Telehealth: Payer: Self-pay

## 2016-12-26 DIAGNOSIS — I5022 Chronic systolic (congestive) heart failure: Secondary | ICD-10-CM | POA: Diagnosis not present

## 2016-12-26 DIAGNOSIS — Z95810 Presence of automatic (implantable) cardiac defibrillator: Secondary | ICD-10-CM | POA: Diagnosis not present

## 2016-12-26 DIAGNOSIS — I255 Ischemic cardiomyopathy: Secondary | ICD-10-CM

## 2016-12-26 NOTE — Progress Notes (Signed)
Remote ICD transmission.   

## 2016-12-26 NOTE — Telephone Encounter (Signed)
Remote ICM transmission received.  Attempted patient call and left detailed message regarding transmission and next ICM scheduled for 01/26/2017.  Advised to return call for any fluid symptoms or questions.   

## 2016-12-26 NOTE — Progress Notes (Signed)
EPIC Encounter for ICM Monitoring  Patient Name: [name] is a 83 y.o. male Date: 12/26/2016 Primary Care Physican: [name], MD Primary Cardiologist:Harwani/McLean  Electrophysiologist: [name] Dry Weight:[place] Bi-V Pacing: 97.5%        Attempted call to [name], DPR, unable to reach.  Left detailed message regarding transmission.  Transmission reviewed.    Thoracic impedance normal   Prescribed dosage: Torsemide changed to 69m 2tablet (40 mg total) every other day   Labs: 11/24/2016 Creatinine 1.95, BUN 53, Potassium 4.3, Sodium 136, EGFR 29-34 12/12/2017Creatinine 1.52, BUN 29, Potassium 4.2, Sodium 138, EGFR 39-46 11/28/2017Creatinine 1.63, BUN 30, Potassium 4.1, Sodium 138, EGFR 36-42  11/15/2017Creatinine 1.36, BUN 33, Potassium 3.9, Sodium 139, EGFR 45-52  11/01/2017Creatinine 1.01, BUN 17, Potassium 4.7, Sodium 139, EGFR >60  06/21/2016 Creatinine 1.44, BUN 40, Potassium 4.3, Sodium 141, EGFR 42-49  06/15/2016 Creatinine 1.76, BUN 55, Potassium 4.2, Sodium 135, EGFR 33-38   Recommendations: Left voice mail with ICM number and encouraged to call for fluid symptoms.  Follow-up plan: ICM clinic phone appointment on 01/26/2017.  Office appointment scheduled tomorrow on 12/27/2016 with HF Dr MAundra Dubin  Copy of ICM check sent to device physician.   3 month ICM trend: 12/26/2016   1 Year ICM trend:      LRosalene Billings RN 12/26/2016 11:19 AM

## 2016-12-27 ENCOUNTER — Encounter (HOSPITAL_COMMUNITY): Payer: Self-pay

## 2016-12-27 ENCOUNTER — Ambulatory Visit (HOSPITAL_COMMUNITY)
Admission: RE | Admit: 2016-12-27 | Discharge: 2016-12-27 | Disposition: A | Discharge status: Home / Self Care | Payer: Medicare Other | Source: Ambulatory Visit | Attending: Cardiology | Admitting: Cardiology

## 2016-12-27 VITALS — BP 126/58 | HR 60 | Wt 145.8 lb

## 2016-12-27 DIAGNOSIS — I2510 Atherosclerotic heart disease of native coronary artery without angina pectoris: Secondary | ICD-10-CM | POA: Insufficient documentation

## 2016-12-27 DIAGNOSIS — M109 Gout, unspecified: Secondary | ICD-10-CM | POA: Diagnosis not present

## 2016-12-27 DIAGNOSIS — I130 Hypertensive heart and chronic kidney disease with heart failure and stage 1 through stage 4 chronic kidney disease, or unspecified chronic kidney disease: Secondary | ICD-10-CM | POA: Diagnosis not present

## 2016-12-27 DIAGNOSIS — I5022 Chronic systolic (congestive) heart failure: Secondary | ICD-10-CM | POA: Diagnosis not present

## 2016-12-27 DIAGNOSIS — E785 Hyperlipidemia, unspecified: Secondary | ICD-10-CM | POA: Insufficient documentation

## 2016-12-27 DIAGNOSIS — I472 Ventricular tachycardia, unspecified: Secondary | ICD-10-CM

## 2016-12-27 DIAGNOSIS — I255 Ischemic cardiomyopathy: Secondary | ICD-10-CM | POA: Insufficient documentation

## 2016-12-27 DIAGNOSIS — Z951 Presence of aortocoronary bypass graft: Secondary | ICD-10-CM | POA: Insufficient documentation

## 2016-12-27 DIAGNOSIS — N183 Chronic kidney disease, stage 3 (moderate): Secondary | ICD-10-CM | POA: Diagnosis not present

## 2016-12-27 DIAGNOSIS — Z7982 Long term (current) use of aspirin: Secondary | ICD-10-CM | POA: Insufficient documentation

## 2016-12-27 DIAGNOSIS — K219 Gastro-esophageal reflux disease without esophagitis: Secondary | ICD-10-CM | POA: Diagnosis not present

## 2016-12-27 DIAGNOSIS — I4892 Unspecified atrial flutter: Secondary | ICD-10-CM | POA: Diagnosis not present

## 2016-12-27 LAB — BASIC METABOLIC PANEL
ANION GAP: 6 (ref 5–15)
BUN: 49 mg/dL — ABNORMAL HIGH (ref 6–20)
CALCIUM: 9.4 mg/dL (ref 8.9–10.3)
CO2: 28 mmol/L (ref 22–32)
Chloride: 100 mmol/L — ABNORMAL LOW (ref 101–111)
Creatinine, Ser: 1.7 mg/dL — ABNORMAL HIGH (ref 0.61–1.24)
GFR, EST AFRICAN AMERICAN: 40 mL/min — AB (ref >60)
GFR, EST NON AFRICAN AMERICAN: 34 mL/min — AB (ref >60)
GLUCOSE: 92 mg/dL (ref 65–99)
Potassium: 4.6 mmol/L (ref 3.5–5.1)
Sodium: 134 mmol/L — ABNORMAL LOW (ref 135–145)

## 2016-12-27 LAB — BRAIN NATRIURETIC PEPTIDE: B Natriuretic Peptide: 377.2 pg/mL — ABNORMAL HIGH (ref 0.0–100.0)

## 2016-12-27 MED ORDER — CARVEDILOL 6.25 MG PO TABS: 6.2500 mg | ORAL_TABLET | Freq: Two times a day (BID) | ORAL | 3 refills | Status: DC

## 2016-12-27 NOTE — Patient Instructions (Signed)
Increase Carvedilol to 6.25 mg Twice daily   Labs today  Your physician recommends that you schedule a follow-up appointment in: 2 months

## 2016-12-28 NOTE — Progress Notes (Signed)
Advanced Heart Failure Clinic Note   PCP: Dr. Ardeth Perfect HF Cardiology: Dr. Aundra Dubin  83 yo with CAD s/p CABG and chronic systolic CHF from ischemic cardiomyopathy presents for HF clinic evaluation.  He has a Medtronic CRT-D device.  Echo in 11/15 showed EF 20-25%. He had VT x 2 in 9/17 terminated by ATP.   Admitted from clinic 06/10/16 with orthostasis and 24 lb weight loss after switch from Lasix to torsemide.  Optivol with fluid index well below threshold and impedance continuing up.  With orthostasis, light headedness, and living alone pt was admitted for dehydration. AKI noted on labwork. Diuretics held on admission and given IV fluid. Entresto stopped. Dizziness/orthostasis improved.  Torsemide resumed 06/13/16, then cut back further on 10/10 and again on 06/15/16 for uptrend in BUN.  Repeat Echo (10/17) showed EF 35-40%, mildly dilated RV with mild to moderately decreased systolic function, severe TR, PASP 48 mmHg.   He returns for followup today.  Says he is doing well.  Weight is up but he is eating better and has more appetite.  No dyspnea walking on flat ground.  Uses cane or walker for stability.  No falls.  No chest pain.  No lightheadedness.  He has been sleeping in a recliner for years but lays back fairly flat.  No orthopnea.    Optivol: Fluid index < threshold, impedance trending up. >99% BiV pacing.    Labs (9/17): creatinine 1.13, K 3.4, hgb 9.4, BNP 2230 => 2270 Labs (10/17): K 4.1, creatinine 1.56  Labs (11/17): K 3.9, creatinine 1.36, BNP 1183 Labs (3/18): K 4.3, creatinine 1.95  PMH: 1. Atrial flutter: Paroxysmal. 2. PVCs, h/o VT.  3. Chronic systolic CHF: Ischemic cardiomyopathy.  Medtronic CRT-D.   - Echo (11/15): EF 20-25%, mildly dilated LV.   - Echo (10/17) showed EF 35-40%, mildly dilated RV with mild to moderately decreased systolic function, severe TR, PASP 48 mmHg. 4. H/o SBO 5. HTN 6. Gout  7. Hyperlipidemia 8. GERD 9. CAD: s/p CABG in 5/12 with LIMA-LAD,  SVG-ramus, SVG-D1, SVG-PDA. - PCI to OM1 and proximal LCx in 8/12.  10. CKD: Stage 3.   SH: Nonsmoker, lives in apartment, no ETOH.    Family History  Problem Relation Age of Onset  . Heart Problems Mother   . Diabetes Mother   . Heart Problems Brother    ROS: All systems reviewed and negative except as per HPI.   Current Outpatient Prescriptions  Medication Sig Dispense Refill  . acetaminophen (TYLENOL) 500 MG tablet Take 500 mg by mouth 2 (two) times daily as needed for mild pain.    Marland Kitchen acidophilus (RISAQUAD) CAPS capsule Take 1 capsule by mouth daily.    Marland Kitchen allopurinol (ZYLOPRIM) 300 MG tablet Take 300 mg by mouth daily.      Marland Kitchen atorvastatin (LIPITOR) 20 MG tablet Take 20 mg by mouth daily at 6 PM.    . carvedilol (COREG) 6.25 MG tablet Take 1 tablet (6.25 mg total) by mouth 2 (two) times daily with a meal. 60 tablet 3  . escitalopram (LEXAPRO) 10 MG tablet Take 1 tablet (10 mg total) by mouth daily.  0  . esomeprazole (NEXIUM) 20 MG capsule Take 20 mg by mouth every morning.     . famotidine (PEPCID) 20 MG tablet Take 20 mg by mouth daily.     Marland Kitchen ketotifen (ZADITOR) 0.025 % ophthalmic solution Place 1 drop into both eyes daily as needed (dry eyes).     Marland Kitchen lisinopril (PRINIVIL,ZESTRIL) 2.5  MG tablet take 1 tablet by mouth once daily 30 tablet 3  . LORazepam (ATIVAN) 0.5 MG tablet Take 0.5 mg by mouth daily.    Marland Kitchen oxyCODONE (OXY IR/ROXICODONE) 5 MG immediate release tablet Take 1 tablet (5 mg total) by mouth every 6 (six) hours as needed for moderate pain. 30 tablet 0  . PATADAY 0.2 % SOLN Place 1 drop into both eyes daily.     Marland Kitchen RA ASPIRIN ADULT LOW STRENGTH 81 MG EC tablet Take 1 tablet by mouth daily.  0  . tiZANidine (ZANAFLEX) 2 MG tablet Take 1 tablet (2 mg total) by mouth every 8 (eight) hours as needed for muscle spasms. 30 tablet 0  . torsemide (DEMADEX) 20 MG tablet Take 2 tablets (40 mg total) by mouth every other day. 90 tablet 3  . NITROSTAT 0.4 MG SL tablet Place 0.4 mg  under the tongue every 5 (five) minutes as needed for chest pain (MAX 3 TABLETS).      No current facility-administered medications for this encounter.    BP (!) 126/58   Pulse 60   Wt 145 lb 12 oz (66.1 kg)   SpO2 100%   BMI 25.02 kg/m    Wt Readings from Last 3 Encounters:  12/27/16 145 lb 12 oz (66.1 kg)  10/13/16 130 lb (59 kg)  09/13/16 134 lb 6.4 oz (61 kg)    General: NAD, Elderly appearing. Ambulated in the clinic.  HEENT: Normal.  Neck: Supple, JVP 7 cm.  No thyromegaly or thyroid nodule.  Lungs: clear to auscultation bilaterally CV: Nondisplaced PMI.  Heart regular S1/S2, no S3/S4, 1/6 HSM at LLSB.  No edema.  No carotid bruit.  Normal pedal pulses.  Abdomen: Soft, NT, ND, no HSM. No bruits or masses. +BS  Skin: Intact without lesions or rashes.  Neurologic: Alert and oriented x 3.  Psych: Normal affect, slightly flat Extremities: No clubbing or cyanosis.   Assessment/Plan: 1. Chronic systolic CHF: Ischemic cardiomyopathy.  He has a Medtronic CRT-D device.  Repeat echo 10/17 showed EF up a bit to 35-40%.  NYHA II.  Euvolemic by exam and Optivol today.  - Continue torsemide 40 mg every other day.  BMET/BNP today.  - He has failed Entresto with marked hypotension.  - Continue lisinopril 2.5 mg qhs - Increase Coreg to 6.25 mg bid.  2. CAD: s/p CABG.  No chest pain.  - Continue statin and ASA 81 daily.  3. VT: ATP 9/17 was successful.  No VT since then.  Has follow up with Dr [name].  4. Tricuspid regurgitation: Severe on last echo.   5. CKD III: BMET today.    Followup in 2 months.  Loralie Champagne 12/28/2016

## 2016-12-29 ENCOUNTER — Encounter: Payer: Self-pay | Admitting: Cardiology

## 2016-12-29 LAB — CUP PACEART REMOTE DEVICE CHECK
Battery Remaining Longevity: 33 mo
Brady Statistic AP VP Percent: 69.14 %
Brady Statistic AS VP Percent: 30.18 %
Brady Statistic RA Percent Paced: 67.95 %
HIGH POWER IMPEDANCE MEASURED VALUE: 41 Ohm
HighPow Impedance: 32 Ohm
Implantable Lead Implant Date: 20041111
Implantable Lead Implant Date: 20150520
Implantable Lead Location: 753859
Implantable Lead Model: 5076
Implantable Pulse Generator Implant Date: 20150520
Lead Channel Impedance Value: 285 Ohm
Lead Channel Impedance Value: 4047 Ohm
Lead Channel Pacing Threshold Amplitude: 0.625 V
Lead Channel Pacing Threshold Pulse Width: 0.4 ms
Lead Channel Pacing Threshold Pulse Width: 0.8 ms
Lead Channel Sensing Intrinsic Amplitude: 2.625 mV
Lead Channel Sensing Intrinsic Amplitude: 5 mV
Lead Channel Sensing Intrinsic Amplitude: 5 mV
Lead Channel Setting Pacing Amplitude: 1.5 V
Lead Channel Setting Pacing Pulse Width: 0.4 ms
Lead Channel Setting Pacing Pulse Width: 0.8 ms
Lead Channel Setting Sensing Sensitivity: 0.3 mV
MDC IDC LEAD IMPLANT DT: 20040709
MDC IDC LEAD IMPLANT DT: 20130206
MDC IDC LEAD LOCATION: 753858
MDC IDC LEAD LOCATION: 753860
MDC IDC LEAD LOCATION: 753860
MDC IDC MSMT BATTERY VOLTAGE: 2.87 V
MDC IDC MSMT LEADCHNL LV IMPEDANCE VALUE: 4047 Ohm
MDC IDC MSMT LEADCHNL LV PACING THRESHOLD AMPLITUDE: 1.875 V
MDC IDC MSMT LEADCHNL RA IMPEDANCE VALUE: 475 Ohm
MDC IDC MSMT LEADCHNL RA SENSING INTR AMPL: 2.625 mV
MDC IDC MSMT LEADCHNL RV IMPEDANCE VALUE: 304 Ohm
MDC IDC MSMT LEADCHNL RV IMPEDANCE VALUE: 399 Ohm
MDC IDC MSMT LEADCHNL RV PACING THRESHOLD AMPLITUDE: 0.625 V
MDC IDC MSMT LEADCHNL RV PACING THRESHOLD PULSEWIDTH: 0.4 ms
MDC IDC SESS DTM: 20180423083825
MDC IDC SET LEADCHNL LV PACING AMPLITUDE: 2.5 V
MDC IDC SET LEADCHNL RV PACING AMPLITUDE: 2 V
MDC IDC STAT BRADY AP VS PERCENT: 0.14 %
MDC IDC STAT BRADY AS VS PERCENT: 0.54 %
MDC IDC STAT BRADY RV PERCENT PACED: 97.52 %

## 2017-01-02 ENCOUNTER — Other Ambulatory Visit: Payer: Self-pay | Admitting: *Deleted

## 2017-01-02 NOTE — Patient Outreach (Signed)
[facility]) Care Management  01/02/2017   [name] [birth year] [phone]  RN Health Coach telephone call to patient.  Hipaa compliance verified. Per patient he is doing good. Patient stated his weight today is  139. Per patient he is weighing daily. Patient stated he is using his cane. Per patient he has not fallen in a long time. RN spoke with caregiver [name]. Patient went to the Dr on the 04/24 and had some medication changes. Patient appetite is very good. Patient just had 32 th birthday. Patient does not have any swelling in lower extremities. Patient is taking medications as per ordered.  Per caregiver patient has lost his insurance card so unable to verify insurance. Patient and caregiver have agreed to follow up outreach calls.   Current Medications:  Current Outpatient Prescriptions  Medication Sig Dispense Refill  . acetaminophen (TYLENOL) 500 MG tablet Take 500 mg by mouth 2 (two) times daily as needed for mild pain.    Marland Kitchen acidophilus (RISAQUAD) CAPS capsule Take 1 capsule by mouth daily.    Marland Kitchen allopurinol (ZYLOPRIM) 300 MG tablet Take 300 mg by mouth daily.      Marland Kitchen atorvastatin (LIPITOR) 20 MG tablet Take 20 mg by mouth daily at 6 PM.    . carvedilol (COREG) 6.25 MG tablet Take 1 tablet (6.25 mg total) by mouth 2 (two) times daily with a meal. 60 tablet 3  . escitalopram (LEXAPRO) 10 MG tablet Take 1 tablet (10 mg total) by mouth daily.  0  . esomeprazole (NEXIUM) 20 MG capsule Take 20 mg by mouth every morning.     . famotidine (PEPCID) 20 MG tablet Take 20 mg by mouth daily.     Marland Kitchen ketotifen (ZADITOR) 0.025 % ophthalmic solution Place 1 drop into both eyes daily as needed (dry eyes).     Marland Kitchen lisinopril (PRINIVIL,ZESTRIL) 2.5 MG tablet take 1 tablet by mouth once daily 30 tablet 3  . LORazepam (ATIVAN) 0.5 MG tablet Take 0.5 mg by mouth daily.    Marland Kitchen NITROSTAT 0.4 MG SL tablet Place 0.4 mg under the tongue every 5 (five) minutes as needed for chest pain  (MAX 3 TABLETS).     Marland Kitchen oxyCODONE (OXY IR/ROXICODONE) 5 MG immediate release tablet Take 1 tablet (5 mg total) by mouth every 6 (six) hours as needed for moderate pain. 30 tablet 0  . PATADAY 0.2 % SOLN Place 1 drop into both eyes daily.     Marland Kitchen RA ASPIRIN ADULT LOW STRENGTH 81 MG EC tablet Take 1 tablet by mouth daily.  0  . tiZANidine (ZANAFLEX) 2 MG tablet Take 1 tablet (2 mg total) by mouth every 8 (eight) hours as needed for muscle spasms. 30 tablet 0  . torsemide (DEMADEX) 20 MG tablet Take 2 tablets (40 mg total) by mouth every other day. 90 tablet 3   No current facility-administered medications for this visit.     Functional Status:  In your present state of health, do you have any difficulty performing the following activities: 01/02/2017 11/15/2016  Hearing? Y N  Vision? Y Y  Difficulty concentrating or making decisions? N N  Walking or climbing stairs? Y Y  Dressing or bathing? Y Y  Doing errands, shopping? [name]  Preparing Food and eating ? Y Y  Using the Toilet? N N  In the past six months, have you accidently leaked urine? Y Y  Do you have problems with loss of bowel control? N N  Managing  your Medications? Y Y  Managing your Finances? [name]  Housekeeping or managing your Housekeeping? Y Y  Some recent data might be hidden    Fall/Depression Screening: Fall Risk  01/02/2017 11/15/2016 10/13/2016  Falls in the past year? No No No  Number falls in past yr: - - -  Injury with Fall? - - -  Risk for fall due to : Impaired balance/gait;Impaired mobility - -   PHQ 2/9 Scores 01/02/2017 11/15/2016 10/13/2016 09/15/2016 06/09/2016 05/27/2016  PHQ - 2 Score 0 0 0 0 1 -  Exception Documentation - - - - - Other- indicate reason in comment box  Not completed - - - - - screening call completed with caregiver   [facility] CM Care Plan Problem One     Most Recent Value  Care Plan Problem One  Knowledge Deficit Heart Failure  Role Documenting the Problem One  [place] for Problem One   Active  THN Long Term Goal (31-90 days)  Patient/caregiver (POA) will continue to be able to describe heart failure zones and when to contact physician within 90 days.   THN Long Term Goal Start Date  01/02/17  Interventions for Problem One [place] reviewed with caregiver heart failure zones and when to notify physician. RN discussed importance of daily weights and monitoring weight gain   THN CM Short Term Goal #1 (0-30 days)  Patient will report weighing daily and documenting weights within the next 30 days  THN CM Short Term Goal #1 Start Date  01/02/17  Interventions for Short Term Goal #1  Educated member and POA the importance of  monitoring  daily weights.  RN sent educational material on daily weighing and what it means to heart failure  THN CM Short Term Goal #2 (0-30 days)  RN discussed eating healthy low sodium diet with CHF  THN CM Short Term Goal #2 Start Date  01/02/17  Interventions for Short Term Goal #2  RN sent educational material on low sodium diet. RN will follow up with discussion and teach back.       Assessment:  Patient appetite is better Patient has gained some weight Patient has some medication changes Patient has not had any hospital admissions in the past  6 months Patient will continue to  benefit from [place] telephonic outreach for education and support for  CHF self management. Plan:  RN discussed falls RN discussed importance of daily weighing and scale accuracy RN discussed medication adherence RN will follow up within the month of May with plan guidelines  [place] Management [phone]

## 2017-01-18 ENCOUNTER — Telehealth (HOSPITAL_COMMUNITY): Payer: Self-pay | Admitting: Cardiology

## 2017-01-18 NOTE — Telephone Encounter (Signed)
Verified medication  TORSEMIDE 40 every other day  Weight 143, 143, 145, 143  Advised to continue to take meds as ordered. Feel free to call if patient becomes symptomatic

## 2017-01-26 ENCOUNTER — Telehealth: Payer: Self-pay

## 2017-01-26 ENCOUNTER — Ambulatory Visit (INDEPENDENT_AMBULATORY_CARE_PROVIDER_SITE_OTHER): Payer: Medicare Other

## 2017-01-26 DIAGNOSIS — Z95810 Presence of automatic (implantable) cardiac defibrillator: Secondary | ICD-10-CM | POA: Diagnosis not present

## 2017-01-26 DIAGNOSIS — I5022 Chronic systolic (congestive) heart failure: Secondary | ICD-10-CM | POA: Diagnosis not present

## 2017-01-26 NOTE — Progress Notes (Signed)
EPIC Encounter for ICM Monitoring  Patient Name: [name] is a 83 y.o. male Date: 01/26/2017 Primary Care Physican: [name], MD Primary Cardiologist:Harwani/McLean  Electrophysiologist: Druscilla Brownie Weight:[place] Bi-V Pacing: 97.4%       Attempted call to [name], friend and unable to reach.  Left detailed message regarding transmission.  Transmission reviewed.    Thoracic impedance normal   Prescribed dosage: Torsemide changed to '20mg'$  2tablet (40 mg total) every other day   Labs: 11/24/2016 Creatinine 1.95, BUN 53, Potassium 4.3, Sodium 136, EGFR 29-34 12/12/2017Creatinine 1.52, BUN 29, Potassium 4.2, Sodium 138, EGFR 39-46 11/28/2017Creatinine 1.63, BUN 30, Potassium 4.1, Sodium 138, EGFR 36-42  11/15/2017Creatinine 1.36, BUN 33, Potassium 3.9, Sodium 139, EGFR 45-52  11/01/2017Creatinine 1.01, BUN 17, Potassium 4.7, Sodium 139, EGFR >60  06/21/2016 Creatinine 1.44, BUN 40, Potassium 4.3, Sodium 141, EGFR 42-49  06/15/2016 Creatinine 1.76, BUN 55, Potassium 4.2, Sodium 135, EGFR 33-38   Recommendations: Left voice mail with ICM number and encouraged to call for fluid symptoms.  Follow-up plan: ICM clinic phone appointment on 03/27/2017 since he has an office appointment scheduled on 02/28/2017 with HF clinic.  Copy of ICM check sent to device physician.   3 month ICM trend: 01/26/2017   1 Year ICM trend:  Prescribed dosage: Torsemide changed to '20mg'$  2tablet (40 mg total) every other day   Labs: 11/24/2016 Creatinine 1.95, BUN 53, Potassium 4.3, Sodium 136, EGFR 29-34 12/12/2017Creatinine 1.52, BUN 29, Potassium 4.2, Sodium 138, EGFR 39-46 11/28/2017Creatinine 1.63, BUN 30, Potassium 4.1, Sodium 138, EGFR 36-42  11/15/2017Creatinine 1.36, BUN 33, Potassium 3.9, Sodium 139, EGFR 45-52  11/01/2017Creatinine 1.01, BUN 17, Potassium 4.7, Sodium 139, EGFR >60  06/21/2016 Creatinine 1.44, BUN 40, Potassium 4.3, Sodium 141, EGFR 42-49    06/15/2016 Creatinine 1.76, BUN 55, Potassium 4.2, Sodium 135, EGFR 33-38     [name], RN 01/26/2017 2:59 PM

## 2017-01-26 NOTE — Telephone Encounter (Signed)
Remote ICM transmission received.  Attempted patient call and left detailed message regarding transmission and next ICM scheduled for 03/27/2017 since he has a HF appt on 02/28/2017.  Advised to return call for any fluid symptoms or questions.

## 2017-01-31 ENCOUNTER — Other Ambulatory Visit (HOSPITAL_COMMUNITY): Payer: Self-pay | Admitting: Cardiology

## 2017-01-31 ENCOUNTER — Other Ambulatory Visit: Payer: Self-pay | Admitting: Cardiology

## 2017-02-02 ENCOUNTER — Encounter: Payer: Self-pay | Admitting: *Deleted

## 2017-02-02 ENCOUNTER — Encounter (HOSPITAL_COMMUNITY): Payer: Self-pay | Admitting: *Deleted

## 2017-02-02 NOTE — Progress Notes (Signed)
Received medical record request from [facility] on Jan 26, 2017.  Requested records faxed today to [phone].  Original request will be scanned to patient's electronic medical record.

## 2017-02-16 ENCOUNTER — Ambulatory Visit: Payer: Self-pay | Admitting: *Deleted

## 2017-02-16 ENCOUNTER — Other Ambulatory Visit: Payer: Self-pay | Admitting: *Deleted

## 2017-02-16 NOTE — Telephone Encounter (Signed)
This encounter was created in error - please disregard.

## 2017-02-16 NOTE — Patient Outreach (Signed)
[facility]) Care Management  02/16/2017   [name] 04/01/[id number]  RN Health Coach telephone call to patient.  Hipaa compliance verified. Per patient he is doing very well. Patient stated his weight to day is 144. Yesterday 141. Per patient he is not having any shortness of breath, no swelling .Per patient he is taking medications as prescribed. Patient caregiver fixes pillboxes.  Patient stated he had just finished eating. Patient is receiving food from meals on wheels. Patient stated he is getting up to exercise by walking and doing stretching. Patient has agreed to follow up outreach calls.    Current Medications:  Current Outpatient Prescriptions  Medication Sig Dispense Refill  . acetaminophen (TYLENOL) 500 MG tablet Take 500 mg by mouth 2 (two) times daily as needed for mild pain.    Marland Kitchen acidophilus (RISAQUAD) CAPS capsule Take 1 capsule by mouth daily.    Marland Kitchen allopurinol (ZYLOPRIM) 300 MG tablet Take 300 mg by mouth daily.      Marland Kitchen atorvastatin (LIPITOR) 20 MG tablet Take 20 mg by mouth daily at 6 PM.    . carvedilol (COREG) 6.25 MG tablet Take 1 tablet (6.25 mg total) by mouth 2 (two) times daily with a meal. 60 tablet 3  . escitalopram (LEXAPRO) 10 MG tablet Take 1 tablet (10 mg total) by mouth daily.  0  . esomeprazole (NEXIUM) 20 MG capsule Take 20 mg by mouth every morning.     . famotidine (PEPCID) 20 MG tablet Take 20 mg by mouth daily.     Marland Kitchen ketotifen (ZADITOR) 0.025 % ophthalmic solution Place 1 drop into both eyes daily as needed (dry eyes).     Marland Kitchen lisinopril (PRINIVIL,ZESTRIL) 2.5 MG tablet take 1 tablet by mouth once daily 30 tablet 3  . LORazepam (ATIVAN) 0.5 MG tablet Take 0.5 mg by mouth daily.    Marland Kitchen NITROSTAT 0.4 MG SL tablet Place 0.4 mg under the tongue every 5 (five) minutes as needed for chest pain (MAX 3 TABLETS).     Marland Kitchen oxyCODONE (OXY IR/ROXICODONE) 5 MG immediate release tablet Take 1 tablet (5 mg total) by mouth every 6 (six) hours as  needed for moderate pain. 30 tablet 0  . PATADAY 0.2 % SOLN Place 1 drop into both eyes daily.     Marland Kitchen RA ASPIRIN ADULT LOW STRENGTH 81 MG EC tablet Take 1 tablet by mouth daily.  0  . tiZANidine (ZANAFLEX) 2 MG tablet Take 1 tablet (2 mg total) by mouth every 8 (eight) hours as needed for muscle spasms. 30 tablet 0  . torsemide (DEMADEX) 20 MG tablet Take 2 tablets (40 mg total) by mouth every other day. 90 tablet 3   No current facility-administered medications for this visit.     Functional Status:  In your present state of health, do you have any difficulty performing the following activities: 02/16/2017 01/02/2017  Hearing? N Y  Vision? Y Y  Difficulty concentrating or making decisions? N N  Walking or climbing stairs? Y Y  Dressing or bathing? Y Y  Doing errands, shopping? [name]  Preparing Food and eating ? Y Y  Using the Toilet? N N  In the past six months, have you accidently leaked urine? Y Y  Do you have problems with loss of bowel control? N N  Managing your Medications? Y Y  Managing your Finances? [name]  Housekeeping or managing your Housekeeping? Y Y  Some recent data might be hidden  Fall/Depression Screening: Fall Risk  02/16/2017 01/02/2017 11/15/2016  Falls in the past year? No No No  Number falls in past yr: - - -  Injury with Fall? - - -  Risk for fall due to : Impaired balance/gait;Impaired mobility Impaired balance/gait;Impaired mobility -   PHQ 2/9 Scores 02/16/2017 01/02/2017 11/15/2016 10/13/2016 09/15/2016 06/09/2016 05/27/2016  PHQ - 2 Score 0 0 0 0 0 1 -  Exception Documentation - - - - - - Other- indicate reason in comment box  Not completed - - - - - - screening call completed with caregiver   [facility] CM Care Plan Problem One     Most Recent Value  Care Plan Problem One  Knowledge Deficit Heart Failure  Role Documenting the Problem One  [place] for Problem One  Active  THN Long Term Goal   Patient/caregiver (POA) will continue to be able to  describe heart failure zones and when to contact physician within 90 days.   THN Long Term Goal Start Date  02/16/17  Interventions for Problem One [place] reviewed with caregiver heart failure zones and when to notify physician. RN discussed importance of daily weights and monitoring weight gain   THN CM Short Term Goal #1   Patient will report weighing daily and documenting weights within the next 30 days  THN CM Short Term Goal #1 Start Date  02/16/17  Interventions for Short Term Goal #1  Educated member and POA the importance of  monitoring  daily weights.  RN sent educational material on daily weighing and what it means to heart failure  THN CM Short Term Goal #2   RN discussed eating healthy low sodium diet with CHF  THN CM Short Term Goal #2 Start Date  02/16/17  Interventions for Short Term Goal #2  RN sent educational material on low sodium diet. RN will follow up with discussion and teach back.      Assessment:  Patient will continue to benefit from Health Coach telephonic outreach for education and support for CHF self management.  Plan:  RN discussed exercising RN discussed medication adherence RN discussed daily weighing RN discussed meals patient is receiving from meals on wheels RN will follow up within the month of July  Kyriaki Moder [place] Management [phone]

## 2017-02-28 ENCOUNTER — Ambulatory Visit (HOSPITAL_COMMUNITY)
Admission: RE | Admit: 2017-02-28 | Discharge: 2017-02-28 | Disposition: A | Discharge status: Home / Self Care | Payer: Medicare Other | Source: Ambulatory Visit | Attending: Internal Medicine | Admitting: Internal Medicine

## 2017-02-28 VITALS — BP 110/62 | HR 60 | Wt 148.8 lb

## 2017-02-28 DIAGNOSIS — M109 Gout, unspecified: Secondary | ICD-10-CM | POA: Insufficient documentation

## 2017-02-28 DIAGNOSIS — I472 Ventricular tachycardia, unspecified: Secondary | ICD-10-CM

## 2017-02-28 DIAGNOSIS — Z7982 Long term (current) use of aspirin: Secondary | ICD-10-CM | POA: Insufficient documentation

## 2017-02-28 DIAGNOSIS — I255 Ischemic cardiomyopathy: Secondary | ICD-10-CM | POA: Insufficient documentation

## 2017-02-28 DIAGNOSIS — I5022 Chronic systolic (congestive) heart failure: Secondary | ICD-10-CM | POA: Insufficient documentation

## 2017-02-28 DIAGNOSIS — Z79899 Other long term (current) drug therapy: Secondary | ICD-10-CM | POA: Diagnosis not present

## 2017-02-28 DIAGNOSIS — Z951 Presence of aortocoronary bypass graft: Secondary | ICD-10-CM | POA: Insufficient documentation

## 2017-02-28 DIAGNOSIS — K56609 Unspecified intestinal obstruction, unspecified as to partial versus complete obstruction: Secondary | ICD-10-CM | POA: Diagnosis not present

## 2017-02-28 DIAGNOSIS — I2510 Atherosclerotic heart disease of native coronary artery without angina pectoris: Secondary | ICD-10-CM | POA: Diagnosis not present

## 2017-02-28 DIAGNOSIS — N183 Chronic kidney disease, stage 3 unspecified: Secondary | ICD-10-CM

## 2017-02-28 DIAGNOSIS — K219 Gastro-esophageal reflux disease without esophagitis: Secondary | ICD-10-CM | POA: Insufficient documentation

## 2017-02-28 DIAGNOSIS — I130 Hypertensive heart and chronic kidney disease with heart failure and stage 1 through stage 4 chronic kidney disease, or unspecified chronic kidney disease: Secondary | ICD-10-CM | POA: Diagnosis present

## 2017-02-28 DIAGNOSIS — E785 Hyperlipidemia, unspecified: Secondary | ICD-10-CM | POA: Insufficient documentation

## 2017-02-28 DIAGNOSIS — I4892 Unspecified atrial flutter: Secondary | ICD-10-CM | POA: Diagnosis not present

## 2017-02-28 DIAGNOSIS — I071 Rheumatic tricuspid insufficiency: Secondary | ICD-10-CM | POA: Insufficient documentation

## 2017-02-28 LAB — BASIC METABOLIC PANEL
Anion gap: 6 (ref 5–15)
BUN: 49 mg/dL — ABNORMAL HIGH (ref 6–20)
CALCIUM: 9.2 mg/dL (ref 8.9–10.3)
CO2: 24 mmol/L (ref 22–32)
CREATININE: 1.97 mg/dL — AB (ref 0.61–1.24)
Chloride: 106 mmol/L (ref 101–111)
GFR, EST AFRICAN AMERICAN: 33 mL/min — AB (ref >60)
GFR, EST NON AFRICAN AMERICAN: 29 mL/min — AB (ref >60)
Glucose, Bld: 108 mg/dL — ABNORMAL HIGH (ref 65–99)
Potassium: 4.4 mmol/L (ref 3.5–5.1)
SODIUM: 136 mmol/L (ref 135–145)

## 2017-02-28 LAB — BRAIN NATRIURETIC PEPTIDE: B NATRIURETIC PEPTIDE 5: 584.4 pg/mL — AB (ref 0.0–100.0)

## 2017-02-28 NOTE — Progress Notes (Signed)
Advanced Heart Failure Clinic Note   PCP: Dr. Ardeth Perfect HF Cardiology: Dr. Devin Going [name] is a 83 y.o. male with history of CAD s/p CABG and chronic systolic CHF from ischemic cardiomyopathy presents for HF clinic evaluation.  He has a Medtronic CRT-D device.  Echo in 11/15 showed EF 20-25%. He had VT x 2 in 9/17 terminated by ATP.   Admitted from clinic 06/10/16 with orthostasis and 24 lb weight loss after switch from Lasix to torsemide.  Optivol with fluid index well below threshold and impedance continuing up.  With orthostasis, light headedness, and living alone pt was admitted for dehydration. AKI noted on labwork. Diuretics held on admission and given IV fluid. Entresto stopped. Dizziness/orthostasis improved.  Torsemide resumed 06/13/16, then cut back further on 10/10 and again on 06/15/16 for uptrend in BUN.  Repeat Echo (10/17) showed EF 35-40%, mildly dilated RV with mild to moderately decreased systolic function, severe TR, PASP 48 mmHg.   He returns today for regular follow up. At last visit coreg increased. Feeling great overall. Denies SOB with normal activities. On occasion gets fatigue with showering and has to take a break. No orthopnea. Walks with a cane and uses a walker around the house. No falls. Denies CP. Denies lightheadedness or dizziness. Sleeps in a recliner usually, broke his recliner so has been sleeping on the couch without difficulty. Appetite is great.   Optivol: Fluid index < below threshold. Impedence with mild down trend.   Labs (9/17): creatinine 1.13, K 3.4, hgb 9.4, BNP 2230 => 2270 Labs (10/17): K 4.1, creatinine 1.56  Labs (11/17): K 3.9, creatinine 1.36, BNP 1183 Labs (3/18): K 4.3, creatinine 1.95  PMH: 1. Atrial flutter: Paroxysmal. 2. PVCs, h/o VT.  3. Chronic systolic CHF: Ischemic cardiomyopathy.  Medtronic CRT-D.   - Echo (11/15): EF 20-25%, mildly dilated LV.   - Echo (10/17) showed EF 35-40%, mildly dilated RV with mild to moderately  decreased systolic function, severe TR, PASP 48 mmHg. 4. H/o SBO 5. HTN 6. Gout  7. Hyperlipidemia 8. GERD 9. CAD: s/p CABG in 5/12 with LIMA-LAD, SVG-ramus, SVG-D1, SVG-PDA. - PCI to OM1 and proximal LCx in 8/12.  10. CKD: Stage 3.   SH: Nonsmoker, lives in apartment, no ETOH.    Family History  Problem Relation Age of Onset  . Heart Problems Mother   . Diabetes Mother   . Heart Problems Brother    Review of systems complete and found to be negative unless listed in HPI.    Current Outpatient Prescriptions  Medication Sig Dispense Refill  . acetaminophen (TYLENOL) 500 MG tablet Take 500 mg by mouth 2 (two) times daily as needed for mild pain.    Marland Kitchen acidophilus (RISAQUAD) CAPS capsule Take 1 capsule by mouth daily.    Marland Kitchen allopurinol (ZYLOPRIM) 300 MG tablet Take 300 mg by mouth daily.      Marland Kitchen atorvastatin (LIPITOR) 20 MG tablet Take 20 mg by mouth daily at 6 PM.    . carvedilol (COREG) 6.25 MG tablet Take 1 tablet (6.25 mg total) by mouth 2 (two) times daily with a meal. 60 tablet 3  . escitalopram (LEXAPRO) 10 MG tablet Take 1 tablet (10 mg total) by mouth daily.  0  . esomeprazole (NEXIUM) 20 MG capsule Take 20 mg by mouth every morning.     . famotidine (PEPCID) 20 MG tablet Take 20 mg by mouth daily.     Marland Kitchen ketotifen (ZADITOR) 0.025 % ophthalmic solution Place 1  drop into both eyes daily as needed (dry eyes).     Marland Kitchen lisinopril (PRINIVIL,ZESTRIL) 2.5 MG tablet take 1 tablet by mouth once daily 30 tablet 3  . LORazepam (ATIVAN) 0.5 MG tablet Take 0.5 mg by mouth 2 (two) times daily.     Marland Kitchen NITROSTAT 0.4 MG SL tablet Place 0.4 mg under the tongue every 5 (five) minutes as needed for chest pain (MAX 3 TABLETS).     Marland Kitchen oxyCODONE (OXY IR/ROXICODONE) 5 MG immediate release tablet Take 1 tablet (5 mg total) by mouth every 6 (six) hours as needed for moderate pain. 30 tablet 0  . PATADAY 0.2 % SOLN Place 1 drop into both eyes daily.     Marland Kitchen RA ASPIRIN ADULT LOW STRENGTH 81 MG EC tablet Take 1  tablet by mouth daily.  0  . tiZANidine (ZANAFLEX) 2 MG tablet Take 1 tablet (2 mg total) by mouth every 8 (eight) hours as needed for muscle spasms. 30 tablet 0  . torsemide (DEMADEX) 20 MG tablet Take 2 tablets (40 mg total) by mouth every other day. 90 tablet 3   No current facility-administered medications for this encounter.    BP 110/62   Pulse 60   Wt 148 lb 12.8 oz (67.5 kg)   SpO2 97%   BMI 25.54 kg/m    Wt Readings from Last 3 Encounters:  02/28/17 148 lb 12.8 oz (67.5 kg)  12/27/16 145 lb 12 oz (66.1 kg)  10/13/16 130 lb (59 kg)   General: Elderly appearing. NAD. Ambulated into clinic with [place].  HEENT: Normal Neck: Supple. JVP 5-6. Carotids 2+ bilat; no bruits. No thyromegaly or nodule noted. Cor: PMI nondisplaced. RRR, 1/6 HSM at LLSB.  Lungs: CTAB, normal effort. Abdomen: Soft, non-tender, non-distended, no HSM. No bruits or masses. +BS  Extremities: No cyanosis, clubbing, rash, R and LLE no edema.  Neuro: Alert & orientedx3, cranial nerves grossly intact. moves all 4 extremities w/o difficulty. Affect pleasant   Assessment/Plan: 1. Chronic systolic CHF: Ischemic cardiomyopathy.  He has a Medtronic CRT-D device.  Repeat echo 10/17 showed EF up a bit to 35-40%.  - Doing well overall. NYHA II-III ( per wife closer to III, but pt underestimates his symptoms) - Volume status stable on exam and Optivol - Continue torsemide 40 mg every other day. Labs today.  - He has failed Entresto with marked hypotension.  - Continue lisinopril 2.5 mg qhs - Continue Coreg 6.25 mg bid. Up-titration has been limited. - Reinforced fluid restriction to < 2 L daily, sodium restriction to less than 2000 mg daily, and the importance of daily weights.   2. CAD: s/p CABG.  - No chest pain. Continue  statin and ASA 81 daily.  3. VT: ATP 9/17 was successful.  - No further VT. Continue follow up with Dr. [name].   4. Tricuspid regurgitation: - Re-evaluate with repeat echo.    5. CKD III:    - Labs today.     BMET/BNP today. With history of falls, and stable BP, will not increase meds today. Follow up in 4 months with Echo.   Shirley Friar, PA-C  02/28/2017

## 2017-02-28 NOTE — Progress Notes (Signed)
Advanced Heart Failure Medication Review by a Pharmacist  Does the patient  feel that his/her medications are working for him/her?  yes  Has the patient been experiencing any side effects to the medications prescribed?  no  Does the patient measure his/her own blood pressure or blood glucose at home?  no   Does the patient have any problems obtaining medications due to transportation or finances?   no  Understanding of regimen: good Understanding of indications: good Potential of compliance: good Patient understands to avoid NSAIDs. Patient understands to avoid decongestants.  Issues to address at subsequent visits: none.   Pharmacist comments: Mr. Bacallao is a pleasant 83 y/o male who presents with a medication list. Patient endorses good adherence to his medication regimen. He had no medication related questions or concerns for me at this time.   Phillis Knack PharmD Candidate  Time with patient: 10 minutes Preparation and documentation time: 10 minutes Total time: 20 minutes

## 2017-02-28 NOTE — Patient Instructions (Signed)
Routine lab work today. Will notify you of abnormal results, otherwise no news is good news!  No changes to medication at this time.  Follow up 4 months with echocardiogram and appointment with Dr. Aundra Dubin. We will call you closer to this time, or you may call our office to schedule 1 month before you are due to be seen. Take all medication as prescribed the day of your appointment. Bring all medications with you to your appointment.  Do the following things EVERYDAY: 1) Weigh yourself in the morning before breakfast. Write it down and keep it in a log. 2) Take your medicines as prescribed 3) Eat low salt foods-Limit salt (sodium) to 2000 mg per day.  4) Stay as active as you can everyday 5) Limit all fluids for the day to less than 2 liters

## 2017-03-27 ENCOUNTER — Ambulatory Visit (INDEPENDENT_AMBULATORY_CARE_PROVIDER_SITE_OTHER): Payer: Medicare Other | Admitting: *Deleted

## 2017-03-27 ENCOUNTER — Telehealth: Payer: Self-pay

## 2017-03-27 DIAGNOSIS — I255 Ischemic cardiomyopathy: Secondary | ICD-10-CM

## 2017-03-27 DIAGNOSIS — I5022 Chronic systolic (congestive) heart failure: Secondary | ICD-10-CM | POA: Diagnosis not present

## 2017-03-27 DIAGNOSIS — Z95810 Presence of automatic (implantable) cardiac defibrillator: Secondary | ICD-10-CM | POA: Diagnosis not present

## 2017-03-27 NOTE — Progress Notes (Signed)
EPIC Encounter for ICM Monitoring  Patient Name: [name] is a 83 y.o. male Date: 03/27/2017 Primary Care Physican: [name] Hatchet, MD Primary Cardiologist:[name]  Electrophysiologist: [name] Weight:[place] Bi-V Pacing: 97.3%                                                 Attempted call to [name], friend and unable to reach.  Left detailed message regarding transmission.  Transmission reviewed.    Thoracic impedance normal.  Prescribed dosage: Torsemide changed to 72m 2tablet (40 mg total) every other day   Labs: 02/28/2017 Creatinine 1.97, BUN 49, Potassium 4.4, Sodium 136, EGFR 29-33 12/27/2016 Creatinine 1.70, BUN 49, Potassium 4.6, Sodium 134, EGFR 34-40 11/24/2016 Creatinine 1.95, BUN 53, Potassium 4.3, Sodium 136, EGFR 29-34 12/12/2017Creatinine 1.52, BUN 29, Potassium 4.2, Sodium 138, EGFR 39-46 11/28/2017Creatinine 1.63, BUN 30, Potassium 4.1, Sodium 138, EGFR 36-42  11/15/2017Creatinine 1.36, BUN 33, Potassium 3.9, Sodium 139, EGFR 45-52  11/01/2017Creatinine 1.01, BUN 17, Potassium 4.7, Sodium 139, EGFR >60  06/21/2016 Creatinine 1.44, BUN 40, Potassium 4.3, Sodium 141, EGFR 42-49  06/15/2016 Creatinine 1.76, BUN 55, Potassium 4.2, Sodium 135, EGFR 33-38   Recommendations: Left voice mail with ICM number and encouraged to call for fluid symptoms.  Follow-up plan: ICM clinic phone appointment on 04/27/2017.    Copy of ICM check sent to device physician.   3 month ICM trend: 03/27/2017   1 Year ICM trend:      [name] Billings RN 03/27/2017 12:17 PM

## 2017-03-27 NOTE — Telephone Encounter (Signed)
Remote ICM transmission received.  Attempted call to friend [name] and left detailed message regarding transmission and next ICM scheduled for 04/27/2017.  Advised to return call for any fluid symptoms or questions.

## 2017-03-28 ENCOUNTER — Other Ambulatory Visit (HOSPITAL_COMMUNITY): Payer: Self-pay | Admitting: Cardiology

## 2017-03-30 ENCOUNTER — Encounter: Payer: Self-pay | Admitting: Cardiology

## 2017-04-04 ENCOUNTER — Other Ambulatory Visit: Payer: Self-pay

## 2017-04-06 LAB — CUP PACEART REMOTE DEVICE CHECK
Battery Remaining Longevity: 29 mo
Brady Statistic AS VP Percent: 15.42 %
Brady Statistic RA Percent Paced: 83.05 %
Brady Statistic RV Percent Paced: 97.31 %
Date Time Interrogation Session: 20180723062704
HighPow Impedance: 40 Ohm
HighPow Impedance: 52 Ohm
Implantable Lead Implant Date: 20041111
Implantable Lead Implant Date: 20150520
Implantable Lead Location: 753858
Implantable Lead Location: 753859
Implantable Lead Model: 5071
Implantable Lead Model: 5076
Implantable Lead Model: 6947
Implantable Pulse Generator Implant Date: 20150520
Lead Channel Impedance Value: 304 Ohm
Lead Channel Impedance Value: 4047 Ohm
Lead Channel Impedance Value: 456 Ohm
Lead Channel Pacing Threshold Pulse Width: 0.4 ms
Lead Channel Sensing Intrinsic Amplitude: 2.875 mV
Lead Channel Sensing Intrinsic Amplitude: 2.875 mV
Lead Channel Sensing Intrinsic Amplitude: 3.75 mV
Lead Channel Setting Pacing Amplitude: 1.5 V
Lead Channel Setting Pacing Amplitude: 2.5 V
Lead Channel Setting Pacing Pulse Width: 0.4 ms
Lead Channel Setting Sensing Sensitivity: 0.3 mV
MDC IDC LEAD IMPLANT DT: 20040709
MDC IDC LEAD IMPLANT DT: 20130206
MDC IDC LEAD LOCATION: 753860
MDC IDC LEAD LOCATION: 753860
MDC IDC MSMT BATTERY VOLTAGE: 2.95 V
MDC IDC MSMT LEADCHNL LV IMPEDANCE VALUE: 4047 Ohm
MDC IDC MSMT LEADCHNL LV PACING THRESHOLD AMPLITUDE: 1.375 V
MDC IDC MSMT LEADCHNL LV PACING THRESHOLD PULSEWIDTH: 0.8 ms
MDC IDC MSMT LEADCHNL RA IMPEDANCE VALUE: 513 Ohm
MDC IDC MSMT LEADCHNL RA PACING THRESHOLD AMPLITUDE: 0.5 V
MDC IDC MSMT LEADCHNL RV IMPEDANCE VALUE: 342 Ohm
MDC IDC MSMT LEADCHNL RV PACING THRESHOLD AMPLITUDE: 0.625 V
MDC IDC MSMT LEADCHNL RV PACING THRESHOLD PULSEWIDTH: 0.4 ms
MDC IDC MSMT LEADCHNL RV SENSING INTR AMPL: 3.75 mV
MDC IDC SET LEADCHNL LV PACING PULSEWIDTH: 0.8 ms
MDC IDC SET LEADCHNL RV PACING AMPLITUDE: 2 V
MDC IDC STAT BRADY AP VP PERCENT: 84 %
MDC IDC STAT BRADY AP VS PERCENT: 0.35 %
MDC IDC STAT BRADY AS VS PERCENT: 0.22 %

## 2017-04-24 ENCOUNTER — Other Ambulatory Visit (HOSPITAL_COMMUNITY): Payer: Self-pay | Admitting: Cardiology

## 2017-04-27 ENCOUNTER — Ambulatory Visit (INDEPENDENT_AMBULATORY_CARE_PROVIDER_SITE_OTHER): Payer: Medicare Other

## 2017-04-27 ENCOUNTER — Telehealth: Payer: Self-pay

## 2017-04-27 DIAGNOSIS — Z95810 Presence of automatic (implantable) cardiac defibrillator: Secondary | ICD-10-CM

## 2017-04-27 DIAGNOSIS — I5022 Chronic systolic (congestive) heart failure: Secondary | ICD-10-CM

## 2017-04-27 NOTE — Progress Notes (Signed)
EPIC Encounter for ICM Monitoring  Patient Name: [name] is a 83 y.o. male Date: 04/27/2017 Primary Care Physican: [name], MD Primary Cardiologist:Harwani/McLean  Electrophysiologist: Druscilla Brownie Weight:[place] Bi-V Pacing: 96.9%  Attempted call to [name], friendand unable to reach.     Left detailed message regarding transmission.  Transmission reviewed.    Thoracic impedance normal.  Prescribed dosage: Torsemide changed to 45m 2tablet (40 mg total) every other day   Labs: 02/28/2017 Creatinine 1.97, BUN 49, Potassium 4.4, Sodium 136, EGFR 29-33 12/27/2016 Creatinine 1.70, BUN 49, Potassium 4.6, Sodium 134, EGFR 34-40 11/24/2016 Creatinine 1.95, BUN 53, Potassium 4.3, Sodium 136, EGFR 29-34 12/12/2017Creatinine 1.52, BUN 29, Potassium 4.2, Sodium 138, EGFR 39-46 11/28/2017Creatinine 1.63, BUN 30, Potassium 4.1, Sodium 138, EGFR 36-42  11/15/2017Creatinine 1.36, BUN 33, Potassium 3.9, Sodium 139, EGFR 45-52  11/01/2017Creatinine 1.01, BUN 17, Potassium 4.7, Sodium 139, EGFR >60  06/21/2016 Creatinine 1.44, BUN 40, Potassium 4.3, Sodium 141, EGFR 42-49   Recommendations: Left voice mail with ICM number and encouraged to call for fluid symptoms.  Follow-up plan: ICM clinic phone appointment on 05/29/2017.    Copy of ICM check sent to Dr. TLovena Le   3 month ICM trend: 04/27/2017   1 Year ICM trend:      LRosalene Billings RN 04/27/2017 12:20 PM

## 2017-04-27 NOTE — Telephone Encounter (Signed)
Remote ICM transmission received.  Attempted patient call and left detailed message regarding transmission and next ICM scheduled for 05/29/2017.  Advised to return call for any fluid symptoms or questions.

## 2017-05-10 ENCOUNTER — Telehealth (HOSPITAL_COMMUNITY): Payer: Self-pay

## 2017-05-10 NOTE — Telephone Encounter (Signed)
Pt wife called b/c pt was started on finasteride 5 mg day by urology on 05/09/17.

## 2017-05-29 ENCOUNTER — Ambulatory Visit (INDEPENDENT_AMBULATORY_CARE_PROVIDER_SITE_OTHER): Payer: Medicare Other

## 2017-05-29 ENCOUNTER — Telehealth: Payer: Self-pay | Admitting: Cardiology

## 2017-05-29 DIAGNOSIS — Z95810 Presence of automatic (implantable) cardiac defibrillator: Secondary | ICD-10-CM

## 2017-05-29 DIAGNOSIS — I5022 Chronic systolic (congestive) heart failure: Secondary | ICD-10-CM | POA: Diagnosis not present

## 2017-05-29 NOTE — Telephone Encounter (Signed)
LMOVM reminding pt to send remote transmission.   

## 2017-05-29 NOTE — Progress Notes (Signed)
EPIC Encounter for ICM Monitoring  Patient Name: [name] is a 83 y.o. male Date: 05/29/2017 Primary Care Physican: [name] Hatchet, MD Primary Cardiologist:[name]  Electrophysiologist: [name] Weight:[place] Bi-V Pacing: 96.1%      Attempted call to [name], friendand unable to reach.     Left detailed message regarding transmission.  Transmission reviewed.    Thoracic impedance normal.  Prescribed dosage: Torsemide changed to '20mg'$  2tablet (40 mg total) every other day   Labs: 02/28/2017 Creatinine 1.97, BUN 49, Potassium 4.4, Sodium 136, EGFR 29-33 12/27/2016 Creatinine 1.70, BUN 49, Potassium 4.6, Sodium 134, EGFR 34-40 11/24/2016 Creatinine 1.95, BUN 53, Potassium 4.3, Sodium 136, EGFR 29-34 12/12/2017Creatinine 1.52, BUN 29, Potassium 4.2, Sodium 138, EGFR 39-46 11/28/2017Creatinine 1.63, BUN 30, Potassium 4.1, Sodium 138, EGFR 36-42  11/15/2017Creatinine 1.36, BUN 33, Potassium 3.9, Sodium 139, EGFR 45-52  11/01/2017Creatinine 1.01, BUN 17, Potassium 4.7, Sodium 139, EGFR >60  06/21/2016 Creatinine 1.44, BUN 40, Potassium 4.3, Sodium 141, EGFR 42-49   Recommendations: Left voice mail with ICM number and encouraged to call for fluid symptoms.  Follow-up plan: ICM clinic phone appointment on 06/29/2017.    Copy of ICM check sent to Dr. Lovena [name].   3 month ICM trend: 05/29/2017   1 Year ICM trend:      [name] Billings, RN 05/29/2017 3:10 PM

## 2017-06-29 ENCOUNTER — Telehealth: Payer: Self-pay | Admitting: Cardiology

## 2017-06-29 ENCOUNTER — Encounter: Payer: Medicare Other | Admitting: *Deleted

## 2017-06-29 NOTE — Telephone Encounter (Signed)
LMOVM reminding pt to send remote transmission.   

## 2017-06-29 NOTE — Telephone Encounter (Signed)
Follow Up:; ° ° °Returning your call. °

## 2017-06-30 NOTE — Telephone Encounter (Signed)
LMOVM for pt emergency contact to call back.

## 2017-06-30 NOTE — Telephone Encounter (Signed)
Spoke w/ pt emergency contact and informed her that pt remote transmission did not come through automatically and that we will need pt to send a manual transmission. She stated that she will do this on Monday.

## 2017-07-04 NOTE — Progress Notes (Signed)
No ICM remote transmission received for 06/29/2017 and next ICM transmission scheduled for 07/25/2017.

## 2017-07-06 ENCOUNTER — Telehealth: Payer: Self-pay | Admitting: Cardiology

## 2017-07-06 NOTE — Telephone Encounter (Signed)
LVM with [name] to call device clinic back. Number directly to device clinic given

## 2017-07-06 NOTE — Telephone Encounter (Signed)
New Message:     Pt machine is not working.

## 2017-07-06 NOTE — Telephone Encounter (Signed)
Spoke w/ pt POA and she informed that pt home monitor will not turn on. I ordered pt a new monitor and a return kit.  I informed pt POA to send the transmission once the new monitor is received. POA verbalized understanding.

## 2017-07-25 ENCOUNTER — Telehealth: Payer: Self-pay

## 2017-07-25 ENCOUNTER — Ambulatory Visit (INDEPENDENT_AMBULATORY_CARE_PROVIDER_SITE_OTHER): Payer: Medicare Other | Admitting: *Deleted

## 2017-07-25 DIAGNOSIS — Z95810 Presence of automatic (implantable) cardiac defibrillator: Secondary | ICD-10-CM

## 2017-07-25 DIAGNOSIS — I5022 Chronic systolic (congestive) heart failure: Secondary | ICD-10-CM | POA: Diagnosis not present

## 2017-07-25 NOTE — Telephone Encounter (Signed)
Remote ICM transmission received.  Attempted call to [name] and left detailed message per [facility] regarding transmission and next ICM scheduled for 08/25/2017.  Advised to return call for any fluid symptoms or questions.

## 2017-07-25 NOTE — Progress Notes (Signed)
EPIC Encounter for ICM Monitoring  Patient Name: [name] is a 83 y.o. male Date: 07/25/2017 Primary Care Physican: [name], MD Primary Cardiologist:Harwani/McLean  Electrophysiologist: Druscilla Brownie Weight:[place] Bi-V Pacing: 97.4%                                                  Attempted call to [name], friendand unable to reach.Left detailed message regarding transmission. Transmission reviewed.   Thoracic impedance normal.  Prescribed dosage: Torsemide '20mg'$  2tablet (40 mg total) every other day   Labs: 02/28/2017 Creatinine 1.97, BUN 49, Potassium 4.4, Sodium 136, EGFR 29-33 12/27/2016 Creatinine 1.70, BUN 49, Potassium 4.6, Sodium 134, EGFR 34-40 11/24/2016 Creatinine 1.95, BUN 53, Potassium 4.3, Sodium 136, EGFR 29-34 12/12/2017Creatinine 1.52, BUN 29, Potassium 4.2, Sodium 138, EGFR 39-46 11/28/2017Creatinine 1.63, BUN 30, Potassium 4.1, Sodium 138, EGFR 36-42  11/15/2017Creatinine 1.36, BUN 33, Potassium 3.9, Sodium 139, EGFR 45-52  11/01/2017Creatinine 1.01, BUN 17, Potassium 4.7, Sodium 139, EGFR >60  06/21/2016 Creatinine 1.44, BUN 40, Potassium 4.3, Sodium 141, EGFR 42-49   Recommendations: Left voice mail with ICM number and encouraged to call for fluid symptoms.  Follow-up plan: ICM clinic phone appointment on 08/25/2017.    Copy of ICM check sent to Dr. Lovena [name].   3 month ICM trend: 08/04/2017    1 Year ICM trend:       [name] Billings, RN 07/25/2017 5:00 PM

## 2017-07-26 LAB — CUP PACEART REMOTE DEVICE CHECK
Battery Remaining Longevity: 22 mo
Brady Statistic AP VS Percent: 0.28 %
Brady Statistic RA Percent Paced: 77.39 %
Date Time Interrogation Session: 20181120152305
HIGH POWER IMPEDANCE MEASURED VALUE: 45 Ohm
HighPow Impedance: 35 Ohm
Implantable Lead Implant Date: 20041111
Implantable Lead Implant Date: 20150520
Implantable Lead Location: 753858
Implantable Lead Location: 753860
Implantable Lead Model: 5071
Implantable Lead Model: 5076
Implantable Lead Model: 6947
Implantable Pulse Generator Implant Date: 20150520
Lead Channel Impedance Value: 285 Ohm
Lead Channel Pacing Threshold Amplitude: 0.625 V
Lead Channel Pacing Threshold Pulse Width: 0.4 ms
Lead Channel Sensing Intrinsic Amplitude: 2.375 mV
Lead Channel Sensing Intrinsic Amplitude: 3.125 mV
Lead Channel Sensing Intrinsic Amplitude: 3.125 mV
Lead Channel Setting Pacing Amplitude: 1.5 V
Lead Channel Setting Pacing Amplitude: 2.5 V
MDC IDC LEAD IMPLANT DT: 20040709
MDC IDC LEAD IMPLANT DT: 20130206
MDC IDC LEAD LOCATION: 753859
MDC IDC LEAD LOCATION: 753860
MDC IDC MSMT BATTERY VOLTAGE: 2.92 V
MDC IDC MSMT LEADCHNL LV IMPEDANCE VALUE: 4047 Ohm
MDC IDC MSMT LEADCHNL LV IMPEDANCE VALUE: 4047 Ohm
MDC IDC MSMT LEADCHNL LV PACING THRESHOLD AMPLITUDE: 1.125 V
MDC IDC MSMT LEADCHNL LV PACING THRESHOLD PULSEWIDTH: 0.8 ms
MDC IDC MSMT LEADCHNL RA IMPEDANCE VALUE: 475 Ohm
MDC IDC MSMT LEADCHNL RA PACING THRESHOLD AMPLITUDE: 0.625 V
MDC IDC MSMT LEADCHNL RV IMPEDANCE VALUE: 304 Ohm
MDC IDC MSMT LEADCHNL RV IMPEDANCE VALUE: 399 Ohm
MDC IDC MSMT LEADCHNL RV PACING THRESHOLD PULSEWIDTH: 0.4 ms
MDC IDC MSMT LEADCHNL RV SENSING INTR AMPL: 2.375 mV
MDC IDC SET LEADCHNL LV PACING PULSEWIDTH: 0.8 ms
MDC IDC SET LEADCHNL RV PACING AMPLITUDE: 2 V
MDC IDC SET LEADCHNL RV PACING PULSEWIDTH: 0.4 ms
MDC IDC SET LEADCHNL RV SENSING SENSITIVITY: 0.3 mV
MDC IDC STAT BRADY AP VP PERCENT: 78.41 %
MDC IDC STAT BRADY AS VP PERCENT: 21.02 %
MDC IDC STAT BRADY AS VS PERCENT: 0.29 %
MDC IDC STAT BRADY RV PERCENT PACED: 97.44 %

## 2017-07-26 NOTE — Progress Notes (Signed)
Remote ICD transmission.   

## 2017-07-30 ENCOUNTER — Other Ambulatory Visit (HOSPITAL_COMMUNITY): Payer: Self-pay | Admitting: Cardiology

## 2017-07-31 ENCOUNTER — Telehealth (HOSPITAL_COMMUNITY): Payer: Self-pay | Admitting: Vascular Surgery

## 2017-07-31 ENCOUNTER — Other Ambulatory Visit (HOSPITAL_COMMUNITY): Payer: Self-pay | Admitting: *Deleted

## 2017-07-31 MED ORDER — LISINOPRIL 2.5 MG PO TABS
2.5000 mg | ORAL_TABLET | Freq: Every day | ORAL | 3 refills | Status: DC
Start: 2017-07-31 — End: 2017-12-13

## 2017-07-31 NOTE — Telephone Encounter (Signed)
Left pt message to make f/ua ppt w/ [facility] in Jan

## 2017-08-02 ENCOUNTER — Other Ambulatory Visit (HOSPITAL_COMMUNITY): Payer: Self-pay | Admitting: Student

## 2017-08-25 ENCOUNTER — Encounter (HOSPITAL_COMMUNITY): Payer: Self-pay

## 2017-08-25 ENCOUNTER — Emergency Department (HOSPITAL_COMMUNITY)
Admission: EM | Admit: 2017-08-25 | Discharge: 2017-08-25 | Disposition: A | Discharge status: Home / Self Care | Payer: Medicare Other | Attending: Emergency Medicine | Admitting: Emergency Medicine

## 2017-08-25 ENCOUNTER — Emergency Department (HOSPITAL_COMMUNITY): Payer: Medicare Other

## 2017-08-25 ENCOUNTER — Ambulatory Visit (INDEPENDENT_AMBULATORY_CARE_PROVIDER_SITE_OTHER): Payer: Medicare Other

## 2017-08-25 ENCOUNTER — Telehealth: Payer: Self-pay | Admitting: Internal Medicine

## 2017-08-25 DIAGNOSIS — Z87891 Personal history of nicotine dependence: Secondary | ICD-10-CM | POA: Insufficient documentation

## 2017-08-25 DIAGNOSIS — I5022 Chronic systolic (congestive) heart failure: Secondary | ICD-10-CM

## 2017-08-25 DIAGNOSIS — W19XXXA Unspecified fall, initial encounter: Secondary | ICD-10-CM | POA: Diagnosis not present

## 2017-08-25 DIAGNOSIS — Z95810 Presence of automatic (implantable) cardiac defibrillator: Secondary | ICD-10-CM | POA: Insufficient documentation

## 2017-08-25 DIAGNOSIS — I110 Hypertensive heart disease with heart failure: Secondary | ICD-10-CM | POA: Insufficient documentation

## 2017-08-25 DIAGNOSIS — I2510 Atherosclerotic heart disease of native coronary artery without angina pectoris: Secondary | ICD-10-CM | POA: Insufficient documentation

## 2017-08-25 DIAGNOSIS — Z79899 Other long term (current) drug therapy: Secondary | ICD-10-CM | POA: Insufficient documentation

## 2017-08-25 DIAGNOSIS — Z7982 Long term (current) use of aspirin: Secondary | ICD-10-CM | POA: Diagnosis not present

## 2017-08-25 DIAGNOSIS — E119 Type 2 diabetes mellitus without complications: Secondary | ICD-10-CM | POA: Diagnosis not present

## 2017-08-25 DIAGNOSIS — Z951 Presence of aortocoronary bypass graft: Secondary | ICD-10-CM | POA: Insufficient documentation

## 2017-08-25 DIAGNOSIS — R55 Syncope and collapse: Secondary | ICD-10-CM | POA: Diagnosis present

## 2017-08-25 DIAGNOSIS — Z955 Presence of coronary angioplasty implant and graft: Secondary | ICD-10-CM | POA: Insufficient documentation

## 2017-08-25 LAB — BASIC METABOLIC PANEL
Anion gap: 9 (ref 5–15)
BUN: 42 mg/dL — AB (ref 6–20)
CHLORIDE: 101 mmol/L (ref 101–111)
CO2: 24 mmol/L (ref 22–32)
Calcium: 9.2 mg/dL (ref 8.9–10.3)
Creatinine, Ser: 2.16 mg/dL — ABNORMAL HIGH (ref 0.61–1.24)
GFR calc Af Amer: 30 mL/min — ABNORMAL LOW (ref >60)
GFR calc non Af Amer: 26 mL/min — ABNORMAL LOW (ref >60)
GLUCOSE: 138 mg/dL — AB (ref 65–99)
POTASSIUM: 4.1 mmol/L (ref 3.5–5.1)
Sodium: 134 mmol/L — ABNORMAL LOW (ref 135–145)

## 2017-08-25 LAB — CBC WITH DIFFERENTIAL/PLATELET
Basophils Absolute: 0 10*3/uL (ref 0.0–0.1)
Basophils Relative: 1 %
EOS PCT: 2 %
Eosinophils Absolute: 0.1 10*3/uL (ref 0.0–0.7)
HCT: 30.4 % — ABNORMAL LOW (ref 39.0–52.0)
Hemoglobin: 10.5 g/dL — ABNORMAL LOW (ref 13.0–17.0)
LYMPHS ABS: 0.7 10*3/uL (ref 0.7–4.0)
LYMPHS PCT: 21 %
MCH: 32.3 pg (ref 26.0–34.0)
MCHC: 34.5 g/dL (ref 30.0–36.0)
MCV: 93.5 fL (ref 78.0–100.0)
MONO ABS: 0.3 10*3/uL (ref 0.1–1.0)
MONOS PCT: 9 %
Neutro Abs: 2.3 10*3/uL (ref 1.7–7.7)
Neutrophils Relative %: 67 %
PLATELETS: 112 10*3/uL — AB (ref 150–400)
RBC: 3.25 MIL/uL — ABNORMAL LOW (ref 4.22–5.81)
RDW: 14 % (ref 11.5–15.5)
WBC: 3.3 10*3/uL — ABNORMAL LOW (ref 4.0–10.5)

## 2017-08-25 LAB — CBG MONITORING, ED: GLUCOSE-CAPILLARY: 118 mg/dL — AB (ref 65–99)

## 2017-08-25 MED ORDER — SODIUM CHLORIDE 0.9 % IV BOLUS (SEPSIS)
1000.0000 mL | Freq: Once | INTRAVENOUS | Status: AC
  Administered 2017-08-25: 1000 mL via INTRAVENOUS

## 2017-08-25 NOTE — ED Notes (Signed)
Informed Dr. Gilford Raid and Rod Holler, RN about the pt's CBG and vital signs.

## 2017-08-25 NOTE — ED Provider Notes (Signed)
[facility] EMERGENCY DEPARTMENT Provider Note   CSN: [id number] Arrival date & time: 08/25/17  1129     History   Chief Complaint Chief Complaint  Patient presents with  . Loss of Consciousness    HPI [name] is a 83 y.o. male WHO presents tot he ED after fall. He has end stage CHF with EF 20%, + AICD. Patient states that he had meals on wheels delivered. He usually leaves a note for food to be left on the the chair however he forgot and had to get up to go to the door.  Patient states that he was sleeping soundly when they arrived.  He used his walker and as he got to the door to reach for the food he lost his balance and fell to the ground.  He was quickly helped up by the Meals on Wheels delivery with persons.  They insisted he called 911 and he was brought to the emergency department for evaluation.  He denies loss of consciousness, chest pain, shortness of breath, or cysts orthostatic symptoms.  He states "I just lost my footing."  HPI  Past Medical History:  Diagnosis Date  . AICD (automatic cardioverter/defibrillator) present   . Arthritis    "shoulders" (10/31/2014)  . CAD (coronary artery disease)    s/p CABG; s/p Pacemaker  . Diverticulosis of colon   . GERD (gastroesophageal reflux disease)   . Gout   . Hyperlipidemia   . Hypertension   . Ischemic cardiomyopathy    s/p ICD  . On home oxygen therapy    "2L prn" (10/31/2014)  . Paroxysmal ventricular tachycardia ([place])   . SBO (small bowel obstruction) ([place]) 10/31/2014  . Shortness of breath dyspnea    with exertion  . Systolic CHF with reduced left ventricular function, NYHA class 2 [facility])     Patient Active Problem List   Diagnosis Date Noted  . Orthostatic hypotension 06/29/2015  . VT (ventricular tachycardia) ([place])   . C1 cervical fracture ([place]) 06/24/2015  . Anemia of chronic disease 06/24/2015  . Laceration 06/24/2015  . Intractable pain 06/24/2015  . A-fib ([place]) 06/24/2015  .  Hypertension   . GERD (gastroesophageal reflux disease)   . Protein-calorie malnutrition, severe ([place]) 11/02/2014  . Partial small bowel obstruction ([place]) 10/31/2014  . DM type 2 causing vascular disease ([place]) 10/31/2014  . Do not resuscitate 10/31/2014  . Hospice care 10/31/2014  . Small bowel obstruction due to postoperative adhesions 10/31/2014  . Cardiomyopathy, ischemic   . Atrial flutter ([place]) 09/02/2014  . Chronic systolic heart failure ([place]) 07/23/2009  . Automatic implantable cardioverter-defibrillator in situ 07/23/2009  . Coronary atherosclerosis 07/17/2009  . CORONARY ARTERY BYPASS GRAFT, HX OF 07/17/2009    Past Surgical History:  Procedure Laterality Date  . BI-VENTRICULAR IMPLANTABLE CARDIOVERTER DEFIBRILLATOR UPGRADE N/A 01/22/2014   Procedure: BI-VENTRICULAR IMPLANTABLE CARDIOVERTER DEFIBRILLATOR UPGRADE;  Surgeon: Evans Lance, MD;  Location: [facility] CATH LAB;  Service: Cardiovascular;  Laterality: N/A;  . BOWEL RESECTION    . CARDIAC CATHETERIZATION  03/2011   Archie Endo 01/18/2011  . CARDIAC DEFIBRILLATOR PLACEMENT  07/2003   Archie Endo 01/18/2011  . CATARACT EXTRACTION W/ INTRAOCULAR LENS  IMPLANT, BILATERAL Bilateral   . CATARACT EXTRACTION W/PHACO Right 12/17/2014   Procedure: PHACOEMULSIFICATION CATARACT EXTRACTION WITH IOL IMPLANT RIGHT EYE;  Surgeon: Marylynn Pearson, MD;  Location: [facility];  Service: Ophthalmology;  Laterality: Right;  . CHOLECYSTECTOMY    . CORONARY ANGIOPLASTY WITH STENT PLACEMENT  04/2011  2 stents/notes 05/03/2011  . CORONARY ARTERY BYPASS GRAFT  03/2003   CABG X3/notes 01/18/2011  . EYE SURGERY Bilateral    [place]  . IMPLANTABLE CARDIOVERTER DEFIBRILLATOR (ICD) GENERATOR CHANGE Left 10/12/2011   Procedure: ICD GENERATOR CHANGE;  Surgeon: Evans Lance, MD;  Location: [facility] CATH LAB;  Service: Cardiovascular;  Laterality: Left;  . PACEMAKER PLACEMENT    . PACEMAKER REVISION N/A 10/12/2011   Procedure: PACEMAKER REVISION;  Surgeon: Evans Lance, MD;   Location: [facility] C[facility] LAB;  Service: Cardiovascular;  Laterality: N/A;  . TEE WITH CARDIOVERSION  03/2003   Archie Endo 01/18/2011  . TONSILLECTOMY         Home Medications    Prior to Admission medications   Medication Sig Start Date End Date Taking? Authorizing Provider  acetaminophen (TYLENOL) 500 MG tablet Take 500 mg by mouth 2 (two) times daily as needed for mild pain.    [provider]  acidophilus (RISAQUAD) CAPS capsule Take 1 capsule by mouth daily.    [provider]  allopurinol (ZYLOPRIM) 300 MG tablet Take 300 mg by mouth daily.      [provider]  atorvastatin (LIPITOR) 20 MG tablet Take 20 mg by mouth daily at 6 PM.    [provider]  carvedilol (COREG) 6.25 MG tablet take 1 tablet by mouth twice a day WITH A MEAL 04/24/17   Larey Dresser, MD  escitalopram (LEXAPRO) 10 MG tablet Take 1 tablet (10 mg total) by mouth daily. 06/28/15   Ghimire, Henreitta Leber, MD  esomeprazole (NEXIUM) 20 MG capsule Take 20 mg by mouth every morning.     [provider]  famotidine (PEPCID) 20 MG tablet Take 20 mg by mouth daily.     [provider]  finasteride (PROSCAR) 5 MG tablet Take 5 mg by mouth daily.    [provider]  ketotifen (ZADITOR) 0.025 % ophthalmic solution Place 1 drop into both eyes daily as needed (dry eyes).     [provider]  lisinopril (PRINIVIL,ZESTRIL) 2.5 MG tablet Take 1 tablet (2.5 mg total) by mouth daily. 07/31/17   Larey Dresser, MD  LORazepam (ATIVAN) 0.5 MG tablet Take 0.5 mg by mouth 2 (two) times daily.  08/22/16   [provider]  NITROSTAT 0.4 MG SL tablet Place 0.4 mg under the tongue every 5 (five) minutes as needed for chest pain (MAX 3 TABLETS).  12/05/12   [provider]  oxyCODONE (OXY IR/ROXICODONE) 5 MG immediate release tablet Take 1 tablet (5 mg total) by mouth every 6 (six) hours as needed for moderate pain. 06/28/15   Ghimire, Henreitta Leber, MD  PATADAY 0.2 % SOLN  Place 1 drop into both eyes daily.  01/14/14   [provider]  RA ASPIRIN ADULT LOW STRENGTH 81 MG EC tablet Take 1 tablet by mouth daily. 09/01/14   [provider]  tiZANidine (ZANAFLEX) 2 MG tablet Take 1 tablet (2 mg total) by mouth every 8 (eight) hours as needed for muscle spasms. 06/18/15   Gareth Morgan, MD  torsemide (DEMADEX) 20 MG tablet Take 2 tablets (40 mg total) by mouth every other day. 08/03/17 07/29/18  Larey Dresser, MD    Family History Family History  Problem Relation Age of Onset  . Heart Problems Mother   . Diabetes Mother   . Heart Problems Brother     Social History Social History   Tobacco Use  . Smoking status: Never Smoker  . Smokeless  tobacco: Former Systems developer    Types: Chew  . Tobacco comment: no chew in over 3 years  Substance Use Topics  . Alcohol use: Yes    Comment: "might take a drink during the holidays"  . Drug use: No     Allergies   Patient has no known allergies.   Review of Systems Review of Systems  Ten systems reviewed and are negative for acute change, except as noted in the HPI.   Physical Exam Updated Vital Signs BP (!) 103/57   Pulse (!) 59   Temp 98.1 F (36.7 C) (Oral)   Resp 11   Ht 5\' 4"  (1.626 m)   Wt 65.8 kg (145 lb)   SpO2 98%   BMI 24.89 kg/m   Physical Exam  Constitutional: He is oriented to person, place, and time. He appears well-developed. No distress.  HENT:  Head: Normocephalic and atraumatic.  Eyes: Conjunctivae are normal. Pupils are equal, round, and reactive to light. No scleral icterus.  Neck: Normal range of motion. Neck supple.  Cardiovascular: Normal rate, regular rhythm and normal heart sounds.  Pulmonary/Chest: Effort normal and breath sounds normal. No respiratory distress.  Abdominal: Soft. There is no tenderness.  Musculoskeletal: He exhibits no edema.  Neurological: He is alert and oriented to person, place, and time.  Skin: Skin is warm and dry. He is not  diaphoretic.  Psychiatric: His behavior is normal.  Nursing note and vitals reviewed.    ED Treatments / Results  Labs (all labs ordered are listed, but only abnormal results are displayed) Labs Reviewed - No data to display  EKG  EKG Interpretation  Date/Time:  Friday August 25 2017 11:41:52 EST Ventricular Rate:  60 PR Interval:    QRS Duration: 146 QT Interval:  513 QTC Calculation: 513 R Axis:   -93 Text Interpretation:  Sinus or ectopic atrial rhythm Right bundle branch block No significant change since last tracing Confirmed by Isla Pence [id number]) on 08/25/2017 11:47:10 AM       Radiology No results found.  Procedures Procedures (including critical care time)  Medications Ordered in ED Medications - No data to display   Initial Impression / Assessment and Plan / ED Course  I have reviewed the triage vital signs and the nursing notes.  Pertinent labs & imaging results that were available during my care of the patient were reviewed by me and considered in my medical decision making (see chart for details).     Patient with mild orthostasis here.  He was given a little bit of fluid bolus.  He is feeling greatly improved and appears appropriate for discharge at this time.  Patient seen and shared visit with Dr. Gilford Raid who agrees with workup and plan for discharge.  Final Clinical Impressions(s) / ED Diagnoses   Final diagnoses:  Accident due to mechanical fall without injury, initial encounter    ED Discharge Orders    None       Margarita Mail, PA-C 08/25/17 Hutchinson, Julie, MD 08/26/17 [phone]

## 2017-08-25 NOTE — Telephone Encounter (Signed)
°  New Prob  Calling to verify what kind of device pt has. Please call.

## 2017-08-25 NOTE — Discharge Instructions (Signed)
Return for any new or worsening concerns

## 2017-08-25 NOTE — ED Notes (Signed)
Patient transported to CT 

## 2017-08-25 NOTE — Progress Notes (Signed)
EPIC Encounter for ICM Monitoring  Patient Name: [name] is a 83 y.o. male Date: 08/25/2017 Primary Care Physican: [name], MD Primary Cardiologist:Harwani/McLean  Electrophysiologist: Druscilla Brownie Weight:[place] Bi-V Pacing: 97.4%       Transmission received. Patient currently in ER due to fall per ED note   Thoracic impedance normal today with some recent days suggesting fluid accumulation.  Prescribed dosage: Torsemide '20mg'$  2tablet (40 mg total) every other day   Labs: 02/28/2017 Creatinine 1.97, BUN 49, Potassium 4.4, Sodium 136, EGFR 29-33 12/27/2016 Creatinine 1.70, BUN 49, Potassium 4.6, Sodium 134, EGFR 34-40 11/24/2016 Creatinine 1.95, BUN 53, Potassium 4.3, Sodium 136, EGFR 29-34 12/12/2017Creatinine 1.52, BUN 29, Potassium 4.2, Sodium 138, EGFR 39-46 11/28/2017Creatinine 1.63, BUN 30, Potassium 4.1, Sodium 138, EGFR 36-42  11/15/2017Creatinine 1.36, BUN 33, Potassium 3.9, Sodium 139, EGFR 45-52  11/01/2017Creatinine 1.01, BUN 17, Potassium 4.7, Sodium 139, EGFR >60  06/21/2016 Creatinine 1.44, BUN 40, Potassium 4.3, Sodium 141, EGFR 42-49  Recommendations: None.  Follow-up plan: ICM clinic phone appointment on 09/25/2017.    Copy of ICM check sent to Dr. [name].   3 month ICM trend: 08/25/2017    1 Year ICM trend:       [name], RN 08/25/2017 2:04 PM

## 2017-08-25 NOTE — ED Triage Notes (Signed)
Pt in from home via [facility] after witness trip, then fall and brief syncope (less than 10 sec). EMS states pt was walking to door to pick up Meals on Wheels and fell at doorway, fell and passed out. Denies hitting head, has abrasion to L pinky. Not taking thinners. BP low on EMS arrival, 86/40 - given 56ml's, BP now 114/60

## 2017-08-28 NOTE — Telephone Encounter (Signed)
Left message for pt POA to call

## 2017-09-15 ENCOUNTER — Encounter: Payer: Self-pay | Admitting: *Deleted

## 2017-09-21 ENCOUNTER — Ambulatory Visit (HOSPITAL_COMMUNITY)
Admission: RE | Admit: 2017-09-21 | Discharge: 2017-09-21 | Disposition: A | Discharge status: Home / Self Care | Payer: Medicare Other | Source: Ambulatory Visit | Attending: Cardiology | Admitting: Cardiology

## 2017-09-21 ENCOUNTER — Ambulatory Visit (HOSPITAL_BASED_OUTPATIENT_CLINIC_OR_DEPARTMENT_OTHER)
Admission: RE | Admit: 2017-09-21 | Discharge: 2017-09-21 | Disposition: A | Discharge status: Home / Self Care | Payer: Medicare Other | Source: Ambulatory Visit | Attending: Cardiology | Admitting: Cardiology

## 2017-09-21 ENCOUNTER — Other Ambulatory Visit: Payer: Self-pay

## 2017-09-21 ENCOUNTER — Encounter (HOSPITAL_COMMUNITY): Payer: Self-pay | Admitting: Cardiology

## 2017-09-21 VITALS — BP 117/59 | HR 60 | Wt 153.8 lb

## 2017-09-21 DIAGNOSIS — N400 Enlarged prostate without lower urinary tract symptoms: Secondary | ICD-10-CM | POA: Diagnosis not present

## 2017-09-21 DIAGNOSIS — E785 Hyperlipidemia, unspecified: Secondary | ICD-10-CM | POA: Insufficient documentation

## 2017-09-21 DIAGNOSIS — Z7982 Long term (current) use of aspirin: Secondary | ICD-10-CM | POA: Diagnosis not present

## 2017-09-21 DIAGNOSIS — I2510 Atherosclerotic heart disease of native coronary artery without angina pectoris: Secondary | ICD-10-CM | POA: Diagnosis not present

## 2017-09-21 DIAGNOSIS — N183 Chronic kidney disease, stage 3 (moderate): Secondary | ICD-10-CM | POA: Insufficient documentation

## 2017-09-21 DIAGNOSIS — Z8249 Family history of ischemic heart disease and other diseases of the circulatory system: Secondary | ICD-10-CM | POA: Diagnosis not present

## 2017-09-21 DIAGNOSIS — Z95810 Presence of automatic (implantable) cardiac defibrillator: Secondary | ICD-10-CM | POA: Insufficient documentation

## 2017-09-21 DIAGNOSIS — I130 Hypertensive heart and chronic kidney disease with heart failure and stage 1 through stage 4 chronic kidney disease, or unspecified chronic kidney disease: Secondary | ICD-10-CM | POA: Insufficient documentation

## 2017-09-21 DIAGNOSIS — Z8719 Personal history of other diseases of the digestive system: Secondary | ICD-10-CM | POA: Diagnosis not present

## 2017-09-21 DIAGNOSIS — I255 Ischemic cardiomyopathy: Secondary | ICD-10-CM | POA: Insufficient documentation

## 2017-09-21 DIAGNOSIS — M109 Gout, unspecified: Secondary | ICD-10-CM | POA: Diagnosis not present

## 2017-09-21 DIAGNOSIS — Z951 Presence of aortocoronary bypass graft: Secondary | ICD-10-CM | POA: Insufficient documentation

## 2017-09-21 DIAGNOSIS — Z79899 Other long term (current) drug therapy: Secondary | ICD-10-CM | POA: Diagnosis not present

## 2017-09-21 DIAGNOSIS — I5022 Chronic systolic (congestive) heart failure: Secondary | ICD-10-CM

## 2017-09-21 DIAGNOSIS — I071 Rheumatic tricuspid insufficiency: Secondary | ICD-10-CM | POA: Insufficient documentation

## 2017-09-21 DIAGNOSIS — Z833 Family history of diabetes mellitus: Secondary | ICD-10-CM | POA: Insufficient documentation

## 2017-09-21 DIAGNOSIS — K219 Gastro-esophageal reflux disease without esophagitis: Secondary | ICD-10-CM | POA: Insufficient documentation

## 2017-09-21 DIAGNOSIS — I4892 Unspecified atrial flutter: Secondary | ICD-10-CM | POA: Diagnosis not present

## 2017-09-21 LAB — BASIC METABOLIC PANEL
Anion gap: 10 (ref 5–15)
BUN: 40 mg/dL — AB (ref 6–20)
CHLORIDE: 105 mmol/L (ref 101–111)
CO2: 23 mmol/L (ref 22–32)
CREATININE: 2.15 mg/dL — AB (ref 0.61–1.24)
Calcium: 9.2 mg/dL (ref 8.9–10.3)
GFR, EST AFRICAN AMERICAN: 30 mL/min — AB (ref >60)
GFR, EST NON AFRICAN AMERICAN: 26 mL/min — AB (ref >60)
Glucose, Bld: 107 mg/dL — ABNORMAL HIGH (ref 65–99)
Potassium: 4.6 mmol/L (ref 3.5–5.1)
SODIUM: 138 mmol/L (ref 135–145)

## 2017-09-21 LAB — LIPID PANEL
CHOLESTEROL: 124 mg/dL (ref 0–200)
HDL: 47 mg/dL (ref >40)
LDL Cholesterol: 66 mg/dL (ref 0–99)
Total CHOL/HDL Ratio: 2.6 RATIO
Triglycerides: 53 mg/dL (ref <150)
VLDL: 11 mg/dL (ref 0–40)

## 2017-09-21 NOTE — Patient Instructions (Signed)
Labs today (will call for abnormal results, otherwise no news is good news)  Follow up in 4 months 

## 2017-09-21 NOTE — Progress Notes (Signed)
Advanced Heart Failure Clinic Note   PCP: Dr. Ardeth Perfect HF Cardiology: Dr. Aundra Dubin  84 yo with CAD s/p CABG and chronic systolic CHF from ischemic cardiomyopathy presents for HF clinic evaluation.  He has a Medtronic CRT-D device.  Echo in 11/15 showed EF 20-25%. He had VT x 2 in 9/17 terminated by ATP.   Admitted from clinic 06/10/16 with orthostasis and 24 lb weight loss after switch from Lasix to torsemide.  Optivol with fluid index well below threshold and impedance continuing up.  With orthostasis, light headedness, and living alone pt was admitted for dehydration. AKI noted on labwork. Diuretics held on admission and given IV fluid. Entresto stopped. Dizziness/orthostasis improved.  Torsemide resumed 06/13/16, then cut back further on 10/10 and again on 06/15/16 for uptrend in BUN.  Repeat Echo (10/17) showed EF 35-40%, mildly dilated RV with mild to moderately decreased systolic function, severe TR, PASP 48 mmHg.   Echo was done today and reviewed.  EF 35% with wall motion abnormalities, mildly decreased RV systolic function, moderate TR.   He returns for followup of CHF. Symptomatically stable.  No orthopnea/PND. He is stably short of breath after walking about 100 feet.  No chest pain.  No lightheadedness though his balance has been poor and he has had 2 recent falls. Weight is up 5 lbs.     Labs (9/17): creatinine 1.13, K 3.4, hgb 9.4, BNP 2230 => 2270 Labs (10/17): K 4.1, creatinine 1.56  Labs (11/17): K 3.9, creatinine 1.36, BNP 1183 Labs (3/18): K 4.3, creatinine 1.95 Labs (12/18): K 4.1, creatinine 2.16  PMH: 1. Atrial flutter: Paroxysmal. 2. PVCs, h/o VT.  3. Chronic systolic CHF: Ischemic cardiomyopathy.  Medtronic CRT-D.   - Echo (11/15): EF 20-25%, mildly dilated LV.   - Echo (10/17) showed EF 35-40%, mildly dilated RV with mild to moderately decreased systolic function, severe TR, PASP 48 mmHg. - Echo (1/19) with EF 35%, regional WMAs, mildly decreased RV systolic function,  moderate TR.  4. H/o SBO 5. HTN 6. Gout  7. Hyperlipidemia 8. GERD 9. CAD: s/p CABG in 5/12 with LIMA-LAD, SVG-ramus, SVG-D1, SVG-PDA. - PCI to OM1 and proximal LCx in 8/12.  10. CKD: Stage 3.  8. BPH  SH: Nonsmoker, lives in apartment, no ETOH.    Family History  Problem Relation Age of Onset  . Heart Problems Mother   . Diabetes Mother   . Heart Problems Brother    ROS: All systems reviewed and negative except as per HPI.   Current Outpatient Medications  Medication Sig Dispense Refill  . acetaminophen (TYLENOL) 500 MG tablet Take 500 mg by mouth 2 (two) times daily as needed for mild pain.    Marland Kitchen acidophilus (RISAQUAD) CAPS capsule Take 1 capsule by mouth daily.    Marland Kitchen allopurinol (ZYLOPRIM) 300 MG tablet Take 300 mg by mouth daily.      Marland Kitchen atorvastatin (LIPITOR) 20 MG tablet Take 20 mg by mouth daily at 6 PM.    . carvedilol (COREG) 6.25 MG tablet take 1 tablet by mouth twice a day WITH A MEAL 60 tablet 11  . escitalopram (LEXAPRO) 10 MG tablet Take 1 tablet (10 mg total) by mouth daily.  0  . esomeprazole (NEXIUM) 20 MG capsule Take 20 mg by mouth every morning.     . famotidine (PEPCID) 20 MG tablet Take 20 mg by mouth daily.     . finasteride (PROSCAR) 5 MG tablet Take 5 mg by mouth daily.    Marland Kitchen  ketotifen (ZADITOR) 0.025 % ophthalmic solution Place 1 drop into both eyes daily as needed (dry eyes).     Marland Kitchen lisinopril (PRINIVIL,ZESTRIL) 2.5 MG tablet Take 1 tablet (2.5 mg total) by mouth daily. 30 tablet 3  . LORazepam (ATIVAN) 0.5 MG tablet Take 0.5 mg by mouth 2 (two) times daily.     Marland Kitchen NITROSTAT 0.4 MG SL tablet Place 0.4 mg under the tongue every 5 (five) minutes as needed for chest pain (MAX 3 TABLETS).     Marland Kitchen oxyCODONE (OXY IR/ROXICODONE) 5 MG immediate release tablet Take 1 tablet (5 mg total) by mouth every 6 (six) hours as needed for moderate pain. 30 tablet 0  . PATADAY 0.2 % SOLN Place 1 drop into both eyes daily.     Marland Kitchen RA ASPIRIN ADULT LOW STRENGTH 81 MG EC tablet Take  1 tablet by mouth daily.  0  . tiZANidine (ZANAFLEX) 2 MG tablet Take 1 tablet (2 mg total) by mouth every 8 (eight) hours as needed for muscle spasms. 30 tablet 0  . torsemide (DEMADEX) 20 MG tablet Take 2 tablets (40 mg total) by mouth every other day. 90 tablet 3   No current facility-administered medications for this encounter.    BP (!) 117/59   Pulse 60   Wt 153 lb 12 oz (69.7 kg)   SpO2 100%   BMI 26.39 kg/m    Wt Readings from Last 3 Encounters:  09/21/17 153 lb 12 oz (69.7 kg)  08/25/17 145 lb (65.8 kg)  02/28/17 148 lb 12.8 oz (67.5 kg)   General: NAD Neck: No JVD, no thyromegaly or thyroid nodule.  Lungs: Clear to auscultation bilaterally with normal respiratory effort. CV: Nondisplaced PMI.  Heart regular S1/S2, no S3/S4, 2/6 HSM LLSB.  No peripheral edema.  No carotid bruit.  Normal pedal pulses.  Abdomen: Soft, nontender, no hepatosplenomegaly, no distention.  Skin: Intact without lesions or rashes.  Neurologic: Alert and oriented x 3.  Psych: Normal affect. Extremities: No clubbing or cyanosis.  HEENT: Normal.   Assessment/Plan: 1. Chronic systolic CHF: Ischemic cardiomyopathy.  He has a Medtronic CRT-D device.  Echo was done today and reviewed, EF 35% which is stable.  NYHA II-III.  Euvolemic by exam.  - Continue torsemide 40 mg every other day.  BMET/BNP today.  - He has failed Entresto with marked hypotension.  - Continue lisinopril 2.5 mg qhs - Continue Coreg 6.25 mg bid.  - With falls and poor balance, I will not uptitrate meds today.  2. CAD: s/p CABG.  No chest pain.  - Continue statin and ASA 81 daily.  Check lipids today.  3. VT: ATP 9/17 was successful.  No VT since then.  Has follow up with Dr [name].  4. Tricuspid regurgitation: Moderate on today's echo.   5. CKD III: BMET today.    Followup in 4 months.  Loralie Champagne 09/21/2017

## 2017-09-22 ENCOUNTER — Encounter (HOSPITAL_COMMUNITY): Payer: Self-pay

## 2017-09-25 ENCOUNTER — Ambulatory Visit (INDEPENDENT_AMBULATORY_CARE_PROVIDER_SITE_OTHER): Payer: Medicare Other

## 2017-09-25 DIAGNOSIS — I5022 Chronic systolic (congestive) heart failure: Secondary | ICD-10-CM | POA: Diagnosis not present

## 2017-09-25 DIAGNOSIS — Z95810 Presence of automatic (implantable) cardiac defibrillator: Secondary | ICD-10-CM | POA: Diagnosis not present

## 2017-09-25 NOTE — Progress Notes (Signed)
EPIC Encounter for ICM Monitoring  Patient Name: [name] is a 84 y.o. male Date: 09/25/2017 Primary Care Physican: [name], MD Primary Cardiologist:Harwani/McLean  Electrophysiologist: Druscilla Brownie Weight:[place] Bi-V Pacing: 95.8%           Attempted call to friend, [name] and unable to reach.  Left detailed message regarding transmission.  Transmission reviewed.    Thoracic impedance normal.  Prescribed dosage: Torsemide 14m 2tablet (40 mg total) every other day   Labs: 09/21/2017 Creatinine 2.15, BUN 40, Potassium 4.6, Sodium 138, EGFR 26-30 08/25/2017 Creatinine 2.16, BUN 42, Potassium 4.1, Sodium 134, EGFR 26-30 02/28/2017 Creatinine 1.97, BUN 49, Potassium 4.4, Sodium 136, EGFR 29-33 12/27/2016 Creatinine 1.70, BUN 49, Potassium 4.6, Sodium 134, EGFR 34-40 11/24/2016 Creatinine 1.95, BUN 53, Potassium 4.3, Sodium 136, EGFR 29-34 12/12/2017Creatinine 1.52, BUN 29, Potassium 4.2, Sodium 138, EGFR 39-46 11/28/2017Creatinine 1.63, BUN 30, Potassium 4.1, Sodium 138, EGFR 36-42  11/15/2017Creatinine 1.36, BUN 33, Potassium 3.9, Sodium 139, EGFR 45-52  11/01/2017Creatinine 1.01, BUN 17, Potassium 4.7, Sodium 139, EGFR >60  06/21/2016 Creatinine 1.44, BUN 40, Potassium 4.3, Sodium 141, EGFR 42-49  Recommendations: Left voice mail with ICM number and encouraged to call if experiencing any fluid symptoms.  Follow-up plan: ICM clinic phone appointment on 11/06/2017.  Office appointment scheduled 10/04/2017 with Dr. TLovena Le  Copy of ICM check sent to Dr. TLovena Le   3 month ICM trend: 09/25/2017    1 Year ICM trend:       LRosalene Billings RN 09/25/2017 10:18 AM

## 2017-09-26 ENCOUNTER — Telehealth: Payer: Self-pay

## 2017-09-26 NOTE — Telephone Encounter (Signed)
Remote ICM transmission received.  Attempted call to friend [name] and left detailed message per [facility] regarding transmission and next ICM scheduled for 11/06/2017.  Advised to return call for any fluid symptoms or questions.

## 2017-10-04 ENCOUNTER — Ambulatory Visit: Payer: Medicare Other | Admitting: Internal Medicine

## 2017-10-04 ENCOUNTER — Encounter: Payer: Self-pay | Admitting: Internal Medicine

## 2017-10-04 VITALS — BP 110/64 | HR 61 | Ht 64.0 in | Wt 154.0 lb

## 2017-10-04 DIAGNOSIS — I255 Ischemic cardiomyopathy: Secondary | ICD-10-CM

## 2017-10-04 DIAGNOSIS — I472 Ventricular tachycardia, unspecified: Secondary | ICD-10-CM

## 2017-10-04 DIAGNOSIS — I5022 Chronic systolic (congestive) heart failure: Secondary | ICD-10-CM | POA: Diagnosis not present

## 2017-10-04 DIAGNOSIS — Z95810 Presence of automatic (implantable) cardiac defibrillator: Secondary | ICD-10-CM

## 2017-10-04 NOTE — Patient Instructions (Signed)
Medication Instructions:  Your physician recommends that you continue on your current medications as directed. Please refer to the Current Medication list given to you today.  Labwork: None ordered.  Testing/Procedures: None ordered.  Follow-Up: Your physician wants you to follow-up in: one year with Dr. [name].   You will receive a reminder letter in the mail two months in advance. If you don't receive a letter, please call our office to schedule the follow-up appointment.  Remote monitoring is used to monitor your ICD from home. This monitoring reduces the number of office visits required to check your device to one time per year. It allows [place] to keep an eye on the functioning of your device to ensure it is working properly. You are scheduled for a device check from home on 10/24/2017. You may send your transmission at any time that day. If you have a wireless device, the transmission will be sent automatically. After your physician reviews your transmission, you will receive a postcard with your next transmission date.  Any Other Special Instructions Will Be Listed Below (If Applicable).  If you need a refill on your cardiac medications before your next appointment, please call your pharmacy.

## 2017-10-04 NOTE — Progress Notes (Signed)
HPI [name] returns today for followup. He is a pleasant elderly man with chronic systolic heart failure, HTN, PVC's and CAD. He is s/p ICD insertion. He has a h/o VT with ATP. He denies anginal symptoms, and has had minimal edema. He denies dietary indiscretion.  No Known Allergies   Current Outpatient Medications  Medication Sig Dispense Refill  . acetaminophen (TYLENOL) 500 MG tablet Take 500 mg by mouth 2 (two) times daily as needed for mild pain.    Marland Kitchen acidophilus (RISAQUAD) CAPS capsule Take 1 capsule by mouth daily.    Marland Kitchen allopurinol (ZYLOPRIM) 300 MG tablet Take 300 mg by mouth daily.      Marland Kitchen atorvastatin (LIPITOR) 20 MG tablet Take 20 mg by mouth daily at 6 PM.    . carvedilol (COREG) 6.25 MG tablet take 1 tablet by mouth twice a day WITH A MEAL 60 tablet 11  . escitalopram (LEXAPRO) 10 MG tablet Take 1 tablet (10 mg total) by mouth daily.  0  . esomeprazole (NEXIUM) 20 MG capsule Take 20 mg by mouth every morning.     . famotidine (PEPCID) 20 MG tablet Take 20 mg by mouth daily.     . finasteride (PROSCAR) 5 MG tablet Take 5 mg by mouth daily.    Marland Kitchen ketotifen (ZADITOR) 0.025 % ophthalmic solution Place 1 drop into both eyes daily as needed (dry eyes).     Marland Kitchen lisinopril (PRINIVIL,ZESTRIL) 2.5 MG tablet Take 1 tablet (2.5 mg total) by mouth daily. 30 tablet 3  . LORazepam (ATIVAN) 0.5 MG tablet Take 0.5 mg by mouth 2 (two) times daily.     Marland Kitchen NITROSTAT 0.4 MG SL tablet Place 0.4 mg under the tongue every 5 (five) minutes as needed for chest pain (MAX 3 TABLETS).     Marland Kitchen oxyCODONE (OXY IR/ROXICODONE) 5 MG immediate release tablet Take 1 tablet (5 mg total) by mouth every 6 (six) hours as needed for moderate pain. 30 tablet 0  . PATADAY 0.2 % SOLN Place 1 drop into both eyes daily.     Marland Kitchen RA ASPIRIN ADULT LOW STRENGTH 81 MG EC tablet Take 1 tablet by mouth daily.  0  . tiZANidine (ZANAFLEX) 2 MG tablet Take 1 tablet (2 mg total) by mouth every 8 (eight) hours as needed for muscle  spasms. 30 tablet 0  . torsemide (DEMADEX) 20 MG tablet Take 2 tablets (40 mg total) by mouth every other day. 90 tablet 3   No current facility-administered medications for this visit.      Past Medical History:  Diagnosis Date  . AICD (automatic cardioverter/defibrillator) present   . Arthritis    "shoulders" (10/31/2014)  . CAD (coronary artery disease)    s/p CABG; s/p Pacemaker  . Diverticulosis of colon   . GERD (gastroesophageal reflux disease)   . Gout   . Hyperlipidemia   . Hypertension   . Ischemic cardiomyopathy    s/p ICD  . On home oxygen therapy    "2L prn" (10/31/2014)  . Paroxysmal ventricular tachycardia ([place])   . SBO (small bowel obstruction) ([place]) 10/31/2014  . Shortness of breath dyspnea    with exertion  . Systolic CHF with reduced left ventricular function, NYHA class 2 ([place])     ROS:   All systems reviewed and negative except as noted in the HPI.   Past Surgical History:  Procedure Laterality Date  . BI-VENTRICULAR IMPLANTABLE CARDIOVERTER DEFIBRILLATOR UPGRADE N/A 01/22/2014   Procedure: BI-VENTRICULAR IMPLANTABLE  CARDIOVERTER DEFIBRILLATOR UPGRADE;  Surgeon: Evans Lance, MD;  Location: [facility] CATH LAB;  Service: Cardiovascular;  Laterality: N/A;  . BOWEL RESECTION    . CARDIAC CATHETERIZATION  03/2011   Archie Endo 01/18/2011  . CARDIAC DEFIBRILLATOR PLACEMENT  07/2003   Archie Endo 01/18/2011  . CATARACT EXTRACTION W/ INTRAOCULAR LENS  IMPLANT, BILATERAL Bilateral   . CATARACT EXTRACTION W/PHACO Right 12/17/2014   Procedure: PHACOEMULSIFICATION CATARACT EXTRACTION WITH IOL IMPLANT RIGHT EYE;  Surgeon: Marylynn Pearson, MD;  Location: [place];  Service: Ophthalmology;  Laterality: Right;  . CHOLECYSTECTOMY    . CORONARY ANGIOPLASTY WITH STENT PLACEMENT  04/2011   2 stents/notes 05/03/2011  . CORONARY ARTERY BYPASS GRAFT  03/2003   CABG X3/notes 01/18/2011  . EYE SURGERY Bilateral    [place]  . IMPLANTABLE CARDIOVERTER DEFIBRILLATOR (ICD) GENERATOR CHANGE Left  10/12/2011   Procedure: ICD GENERATOR CHANGE;  Surgeon: Evans Lance, MD;  Location: [facility] CATH LAB;  Service: Cardiovascular;  Laterality: Left;  . PACEMAKER PLACEMENT    . PACEMAKER REVISION N/A 10/12/2011   Procedure: PACEMAKER REVISION;  Surgeon: Evans Lance, MD;  Location: [facility] C[facility] LAB;  Service: Cardiovascular;  Laterality: N/A;  . TEE WITH CARDIOVERSION  03/2003   Archie Endo 01/18/2011  . TONSILLECTOMY       Family History  Problem Relation Age of Onset  . Heart Problems Mother   . Diabetes Mother   . Heart Problems Brother      Social History   Socioeconomic History  . Marital status: Divorced    Spouse name: Not on file  . Number of children: Not on file  . Years of education: Not on file  . Highest education level: Not on file  Social Needs  . Financial resource strain: Not on file  . Food insecurity - worry: Not on file  . Food insecurity - inability: Not on file  . Transportation needs - medical: Not on file  . Transportation needs - non-medical: Not on file  Occupational History  . Not on file  Tobacco Use  . Smoking status: Never Smoker  . Smokeless tobacco: Former Systems developer    Types: Chew  . Tobacco comment: no chew in over 3 years  Substance and Sexual Activity  . Alcohol use: Yes    Comment: "might take a drink during the holidays"  . Drug use: No  . Sexual activity: No  Other Topics Concern  . Not on file  Social History Narrative  . Not on file     BP 110/64   Pulse 61   Ht 5\' 4"  (1.626 m)   Wt 154 lb (69.9 kg)   SpO2 99%   BMI 26.43 kg/m   Physical Exam:  Well appearing NAD HEENT: Unremarkable Neck:  No JVD, no thyromegally Lymphatics:  No adenopathy Back:  No CVA tenderness Lungs:  Clear HEART:  Regular rate rhythm, no murmurs, no rubs, no clicks Abd:  soft, positive bowel sounds, no organomegally, no rebound, no guarding Ext:  2 plus pulses, no edema, no cyanosis, no clubbing Skin:  No rashes no nodules Neuro:  CN II through XII intact,  motor grossly intact  EKG - none  DEVICE  Normal device function.  See PaceArt for details.   Assess/Plan: 1. VT - in the interim, he has had no VT. He will continue his current meds. 2. ICD - his medtronic BiV ICD is working normally. He will continue his current meds. 3. Chronic systolic heart failure - his symptoms are class  2-3. He will continue his current meds. He denies dietary indiscretion.   Mikle Bosworth.D.

## 2017-10-24 ENCOUNTER — Ambulatory Visit (INDEPENDENT_AMBULATORY_CARE_PROVIDER_SITE_OTHER): Payer: Medicare Other | Admitting: *Deleted

## 2017-10-24 DIAGNOSIS — I255 Ischemic cardiomyopathy: Secondary | ICD-10-CM

## 2017-10-24 NOTE — Progress Notes (Signed)
Remote ICD transmission.   

## 2017-10-25 LAB — CUP PACEART REMOTE DEVICE CHECK
Battery Voltage: 2.93 V
Brady Statistic AP VP Percent: 73.62 %
Brady Statistic AP VS Percent: 0.17 %
Brady Statistic AS VS Percent: 0.48 %
Brady Statistic RV Percent Paced: 95.76 %
HIGH POWER IMPEDANCE MEASURED VALUE: 40 Ohm
HighPow Impedance: 50 Ohm
Implantable Lead Implant Date: 20041111
Implantable Lead Implant Date: 20130206
Implantable Lead Implant Date: 20150520
Implantable Lead Location: 753858
Implantable Lead Location: 753859
Implantable Lead Location: 753860
Implantable Lead Model: 5076
Implantable Lead Model: 6947
Lead Channel Impedance Value: 285 Ohm
Lead Channel Impedance Value: 304 Ohm
Lead Channel Impedance Value: 4047 Ohm
Lead Channel Impedance Value: 4047 Ohm
Lead Channel Impedance Value: 418 Ohm
Lead Channel Pacing Threshold Amplitude: 0.625 V
Lead Channel Pacing Threshold Amplitude: 1.25 V
Lead Channel Pacing Threshold Pulse Width: 0.4 ms
Lead Channel Sensing Intrinsic Amplitude: 3.125 mV
Lead Channel Setting Pacing Amplitude: 1.5 V
Lead Channel Setting Pacing Amplitude: 2 V
Lead Channel Setting Pacing Pulse Width: 0.4 ms
Lead Channel Setting Sensing Sensitivity: 0.3 mV
MDC IDC LEAD IMPLANT DT: 20040709
MDC IDC LEAD LOCATION: 753860
MDC IDC MSMT BATTERY REMAINING LONGEVITY: 23 mo
MDC IDC MSMT LEADCHNL LV PACING THRESHOLD PULSEWIDTH: 0.8 ms
MDC IDC MSMT LEADCHNL RA IMPEDANCE VALUE: 513 Ohm
MDC IDC MSMT LEADCHNL RA SENSING INTR AMPL: 3.125 mV
MDC IDC MSMT LEADCHNL RV PACING THRESHOLD AMPLITUDE: 0.625 V
MDC IDC MSMT LEADCHNL RV PACING THRESHOLD PULSEWIDTH: 0.4 ms
MDC IDC MSMT LEADCHNL RV SENSING INTR AMPL: 4.25 mV
MDC IDC MSMT LEADCHNL RV SENSING INTR AMPL: 4.25 mV
MDC IDC PG IMPLANT DT: 20150520
MDC IDC SESS DTM: 20190219062406
MDC IDC SET LEADCHNL LV PACING AMPLITUDE: 2.5 V
MDC IDC SET LEADCHNL LV PACING PULSEWIDTH: 0.8 ms
MDC IDC STAT BRADY AS VP PERCENT: 25.73 %
MDC IDC STAT BRADY RA PERCENT PACED: 71.27 %

## 2017-10-26 ENCOUNTER — Encounter: Payer: Self-pay | Admitting: Cardiology

## 2017-11-06 ENCOUNTER — Ambulatory Visit (INDEPENDENT_AMBULATORY_CARE_PROVIDER_SITE_OTHER): Payer: Medicare Other

## 2017-11-06 DIAGNOSIS — Z95810 Presence of automatic (implantable) cardiac defibrillator: Secondary | ICD-10-CM | POA: Diagnosis not present

## 2017-11-06 DIAGNOSIS — I5022 Chronic systolic (congestive) heart failure: Secondary | ICD-10-CM

## 2017-11-06 NOTE — Progress Notes (Signed)
EPIC Encounter for ICM Monitoring  Patient Name: [name] is a 84 y.o. male Date: 11/06/2017 Primary Care Physican: [name], MD Primary Cardiologist:Harwani/McLean  Electrophysiologist: Druscilla Brownie Weight:[place] Bi-V Pacing: 96%      Attempted call to friend, [name].  Left a detailed message.  Transmission reviewed.   Thoracic impedance normal.  Prescribed dosage: Torsemide '20mg'$  2tablet (40 mg total) every other day   Labs: 09/21/2017 Creatinine 2.15, BUN 40, Potassium 4.6, Sodium 138, EGFR 26-30 08/25/2017 Creatinine 2.16, BUN 42, Potassium 4.1, Sodium 134, EGFR 26-30 02/28/2017 Creatinine 1.97, BUN 49, Potassium 4.4, Sodium 136, EGFR 29-33 12/27/2016 Creatinine 1.70, BUN 49, Potassium 4.6, Sodium 134, EGFR 34-40 11/24/2016 Creatinine 1.95, BUN 53, Potassium 4.3, Sodium 136, EGFR 29-34 12/12/2017Creatinine 1.52, BUN 29, Potassium 4.2, Sodium 138, EGFR 39-46 11/28/2017Creatinine 1.63, BUN 30, Potassium 4.1, Sodium 138, EGFR 36-42  11/15/2017Creatinine 1.36, BUN 33, Potassium 3.9, Sodium 139, EGFR 45-52  11/01/2017Creatinine 1.01, BUN 17, Potassium 4.7, Sodium 139, EGFR >60  06/21/2016 Creatinine 1.44, BUN 40, Potassium 4.3, Sodium 141, EGFR 42-49  Recommendations: Left voice mail with ICM number and encouraged to call if experiencing any fluid symptoms.  Follow-up plan: ICM clinic phone appointment on 12/07/2017.  Office appointment scheduled 01/17/2018 with Dr. Aundra Dubin.  Copy of ICM check sent to Dr. [name].   3 month ICM trend: 11/06/2017    1 Year ICM trend:       [name], RN 11/06/2017 11:24 AM

## 2017-12-07 ENCOUNTER — Ambulatory Visit (INDEPENDENT_AMBULATORY_CARE_PROVIDER_SITE_OTHER): Payer: Medicare Other

## 2017-12-07 DIAGNOSIS — I5022 Chronic systolic (congestive) heart failure: Secondary | ICD-10-CM

## 2017-12-07 DIAGNOSIS — Z95810 Presence of automatic (implantable) cardiac defibrillator: Secondary | ICD-10-CM

## 2017-12-07 NOTE — Progress Notes (Signed)
EPIC Encounter for ICM Monitoring  Patient Name: [name] is a 84 y.o. male Date: 12/07/2017 Primary Care Physican: [name], MD Primary Cardiologist:Harwani/McLean  Electrophysiologist: Druscilla Brownie Weight:[place] Bi-V Pacing: 96.6%                                                   Call to friend, [name]. Heart Failure questions reviewed, pt has a cold for the last few days which correlates with decreased impedance.   Thoracic impedance abnormal suggesting fluid accumulation.  Prescribed dosage: Torsemide '20mg'$  2tablet (40 mg total) every other day   Labs: 09/21/2017 Creatinine 2.15, BUN 40, Potassium 4.6, Sodium 138, EGFR 26-30 08/25/2017 Creatinine 2.16, BUN 42, Potassium 4.1, Sodium 134, EGFR 26-30 02/28/2017 Creatinine 1.97, BUN 49, Potassium 4.4, Sodium 136, EGFR 29-33 12/27/2016 Creatinine 1.70, BUN 49, Potassium 4.6, Sodium 134, EGFR 34-40 11/24/2016 Creatinine 1.95, BUN 53, Potassium 4.3, Sodium 136, EGFR 29-34 12/12/2017Creatinine 1.52, BUN 29, Potassium 4.2, Sodium 138, EGFR 39-46 11/28/2017Creatinine 1.63, BUN 30, Potassium 4.1, Sodium 138, EGFR 36-42  11/15/2017Creatinine 1.36, BUN 33, Potassium 3.9, Sodium 139, EGFR 45-52  11/01/2017Creatinine 1.01, BUN 17, Potassium 4.7, Sodium 139, EGFR >60  06/21/2016 Creatinine 1.44, BUN 40, Potassium 4.3, Sodium 141, EGFR 42-49  Recommendations: No changes.  Reinforced sodium restriction.  Encouraged to call for fluid symptoms.  Follow-up plan: ICM clinic phone appointment on 12/11/2017 to recheck fluid levelss.  Office appointment scheduled 01/17/2018 with Dr. Aundra Dubin.  Copy of ICM check sent to Dr. Aundra Dubin and Dr. [name] for review and recommendations if needed.   3 month ICM trend: 12/07/2017    1 Year ICM trend:       [name], RN 12/07/2017 10:03 AM

## 2017-12-08 NOTE — Progress Notes (Signed)
Take torsemide 40 mg daily x 4 days then back to prior dose.

## 2017-12-08 NOTE — Progress Notes (Signed)
Call to friend [name], [place].  Advised Dr Aundra Dubin ordered to take torsemide 40 mg daily x 4 days then back to prior dose.  She verbalized understanding and said she will start tomorrow.  Next ICM transmission 12/12/2017

## 2017-12-12 ENCOUNTER — Ambulatory Visit (INDEPENDENT_AMBULATORY_CARE_PROVIDER_SITE_OTHER): Payer: Self-pay

## 2017-12-12 DIAGNOSIS — Z95810 Presence of automatic (implantable) cardiac defibrillator: Secondary | ICD-10-CM

## 2017-12-12 DIAGNOSIS — I5022 Chronic systolic (congestive) heart failure: Secondary | ICD-10-CM

## 2017-12-12 NOTE — Progress Notes (Addendum)
EPIC Encounter for ICM Monitoring  Patient Name: [name] is a 84 y.o. male Date: 12/12/2017 Primary Care Physican: [name], MD Primary Cardiologist:Harwani/McLean  Electrophysiologist: Druscilla Brownie Weight:[place] Bi-V Pacing: 96.9%        Call to friend, [name].  Heart Failure questions reviewed, pt asymptomatic.   Thoracic impedance improved after taking Torsemide 40 mg x 4 consecutive days but remains abnormal suggesting fluid accumulation.  Prescribed dosage: Torsemide 91m 2tablet (40 mg total) every other day   Labs: 09/21/2017 Creatinine 2.15, BUN 40, Potassium 4.6, Sodium 138, EGFR 26-30 08/25/2017 Creatinine 2.16, BUN 42, Potassium 4.1, Sodium 134, EGFR 26-30 02/28/2017 Creatinine 1.97, BUN 49, Potassium 4.4, Sodium 136, EGFR 29-33 12/27/2016 Creatinine 1.70, BUN 49, Potassium 4.6, Sodium 134, EGFR 34-40 11/24/2016 Creatinine 1.95, BUN 53, Potassium 4.3, Sodium 136, EGFR 29-34 12/12/2017Creatinine 1.52, BUN 29, Potassium 4.2, Sodium 138, EGFR 39-46 11/28/2017Creatinine 1.63, BUN 30, Potassium 4.1, Sodium 138, EGFR 36-42  11/15/2017Creatinine 1.36, BUN 33, Potassium 3.9, Sodium 139, EGFR 45-52  11/01/2017Creatinine 1.01, BUN 17, Potassium 4.7, Sodium 139, EGFR >60  06/21/2016 Creatinine 1.44, BUN 40, Potassium 4.3, Sodium 141, EGFR 42-49  Recommendations: No changes.    Encouraged to call for fluid symptoms.  Follow-up plan: ICM clinic phone appointment on 12/15/2017 (manual send).  Office appointment scheduled 01/17/2018 with Dr. MAundra Dubin  Copy of ICM check sent to Dr. MAundra Dubinand Dr. TLovena Lefor review and recommendations if needed.   3 month ICM trend: 12/12/2017    1 Year ICM trend:       LRosalene Billings RN 12/12/2017 8:13 AM

## 2017-12-13 ENCOUNTER — Other Ambulatory Visit (HOSPITAL_COMMUNITY): Payer: Self-pay | Admitting: *Deleted

## 2017-12-13 MED ORDER — LISINOPRIL 2.5 MG PO TABS: 2.5000 mg | ORAL_TABLET | Freq: Every day | ORAL | 3 refills | Status: DC

## 2017-12-13 MED ORDER — TORSEMIDE 20 MG PO TABS: 40.0000 mg | ORAL_TABLET | ORAL | 3 refills | Status: DC

## 2017-12-17 NOTE — Progress Notes (Signed)
Would have him take torsemide 40 mg daily alternating with 20 mg daily if he has been taking 40 mg every other day.  BMET 1 week.

## 2017-12-18 MED ORDER — TORSEMIDE 20 MG PO TABS: ORAL_TABLET | ORAL | 3 refills | Status: DC

## 2017-12-18 NOTE — Addendum Note (Signed)
Addended by: [name] on: 12/18/2017 09:48 AM   Modules accepted: Orders

## 2017-12-18 NOTE — Progress Notes (Signed)
[name], Friend, called back to ask if patient should take Lisinopril every other day since the Furosemide is increased. She thought the BP might be low.  Advised that no other medications should be changed.  Advised to call if he has any dizziness, lightheadedness and weakness.

## 2017-12-18 NOTE — Progress Notes (Addendum)
Call to friend [name], [place].  Confirmed patient has been taking Torsemide 40 mg every other day.   Advised Dr Aundra Dubin ordered Torsemide 20 mg take 2 tablets (40 mg total) every other day alternating with 20 mg every other day.  She said patient may have just received a new refill from [facility] this past week and will call to find out before sending a new script to [facility]. Advised labs to be drawn in a week and she agreed to lab scheduled 12/25/2017 at [facility].  Requested ICM remote transmission to be sent on 12/25/2017 to recheck fluid levels.

## 2017-12-18 NOTE — Progress Notes (Signed)
[name] returned call.  She said she told [facility] to cancel the 12/13/2017 Torsemide script and wants the new script sent to [facility].

## 2017-12-25 ENCOUNTER — Ambulatory Visit (INDEPENDENT_AMBULATORY_CARE_PROVIDER_SITE_OTHER): Payer: Self-pay

## 2017-12-25 ENCOUNTER — Ambulatory Visit (HOSPITAL_COMMUNITY)
Admission: RE | Admit: 2017-12-25 | Discharge: 2017-12-25 | Disposition: A | Discharge status: Home / Self Care | Payer: Medicare Other | Source: Ambulatory Visit | Attending: Internal Medicine | Admitting: Internal Medicine

## 2017-12-25 DIAGNOSIS — I5022 Chronic systolic (congestive) heart failure: Secondary | ICD-10-CM

## 2017-12-25 DIAGNOSIS — Z95810 Presence of automatic (implantable) cardiac defibrillator: Secondary | ICD-10-CM

## 2017-12-25 LAB — BASIC METABOLIC PANEL
ANION GAP: 9 (ref 5–15)
BUN: 28 mg/dL — ABNORMAL HIGH (ref 6–20)
CHLORIDE: 103 mmol/L (ref 101–111)
CO2: 23 mmol/L (ref 22–32)
Calcium: 8.8 mg/dL — ABNORMAL LOW (ref 8.9–10.3)
Creatinine, Ser: 1.75 mg/dL — ABNORMAL HIGH (ref 0.61–1.24)
GFR calc non Af Amer: 33 mL/min — ABNORMAL LOW (ref >60)
GFR, EST AFRICAN AMERICAN: 38 mL/min — AB (ref >60)
GLUCOSE: 206 mg/dL — AB (ref 65–99)
Potassium: 4.1 mmol/L (ref 3.5–5.1)
Sodium: 135 mmol/L (ref 135–145)

## 2017-12-26 ENCOUNTER — Telehealth: Payer: Self-pay

## 2017-12-26 NOTE — Progress Notes (Signed)
EPIC Encounter for ICM Monitoring  Patient Name: [name] is a 84 y.o. male Date: 12/26/2017 Primary Care Physican: [name], MD Primary Cardiologist:Harwani/McLean  Electrophysiologist: Druscilla Brownie Weight:[place] Bi-V Pacing: 96.7%       Attempted call to friend, [name].  Left detailed message regarding transmission.  Transmission reviewed.    Thoracic impedance returned to normal since 12/12/2017 remote transmission and increase in maintenance Torsemide dosage.  Prescribed dosage: Torsemide '20mg'$  2tablet (40 mg total) every other day alternating with 20 mg every other day.  Labs: 12/25/2017 Creatinine 1.75, BUN 28, Potassium 4.1, Sodium 135, EGFR 33-38 09/21/2017 Creatinine 2.15, BUN 40, Potassium 4.6, Sodium 138, EGFR 26-30 08/25/2017 Creatinine 2.16, BUN 42, Potassium 4.1, Sodium 134, EGFR 26-30 02/28/2017 Creatinine 1.97, BUN 49, Potassium 4.4, Sodium 136, EGFR 29-33 12/27/2016 Creatinine 1.70, BUN 49, Potassium 4.6, Sodium 134, EGFR 34-40 11/24/2016 Creatinine 1.95, BUN 53, Potassium 4.3, Sodium 136, EGFR 29-34 12/12/2017Creatinine 1.52, BUN 29, Potassium 4.2, Sodium 138, EGFR 39-46 11/28/2017Creatinine 1.63, BUN 30, Potassium 4.1, Sodium 138, EGFR 36-42  11/15/2017Creatinine 1.36, BUN 33, Potassium 3.9, Sodium 139, EGFR 45-52  11/01/2017Creatinine 1.01, BUN 17, Potassium 4.7, Sodium 139, EGFR >60  06/21/2016 Creatinine 1.44, BUN 40, Potassium 4.3, Sodium 141, EGFR 42-49  Recommendations:  Left voice mail with ICM number and encouraged to call if experiencing any fluid symptoms.  Follow-up plan: ICM clinic phone appointment on 01/25/2018.  Office appointment scheduled 01/17/2018 with Dr.McLean.  Copy of ICM check sent to Dr. [name] and Dr. Aundra Dubin.   3 month ICM trend: 12/26/2017    1 Year ICM trend:       [name], RN 12/26/2017 11:45 AM

## 2017-12-26 NOTE — Telephone Encounter (Signed)
Remote ICM transmission received.  Attempted call to friend, [name], and left detailed message per [facility] regarding transmission and next ICM scheduled for 01/25/2018.  Advised to return call for any fluid symptoms or questions.

## 2017-12-28 ENCOUNTER — Telehealth: Payer: Self-pay

## 2017-12-28 NOTE — Telephone Encounter (Signed)
Returned call to [name], DRP to clarify Torsemide dosage question.  She was not sure which dosage he should be taking and advised to take Torsemide 40 mg every other day alternating with 20 mg every other day.  She confirmed understanding.  No changes today.

## 2018-01-01 ENCOUNTER — Other Ambulatory Visit (HOSPITAL_COMMUNITY): Payer: Self-pay | Admitting: *Deleted

## 2018-01-01 DIAGNOSIS — I5022 Chronic systolic (congestive) heart failure: Secondary | ICD-10-CM

## 2018-01-01 MED ORDER — TORSEMIDE 20 MG PO TABS: ORAL_TABLET | ORAL | 3 refills | Status: DC

## 2018-01-17 ENCOUNTER — Encounter: Payer: Self-pay | Admitting: Cardiology

## 2018-01-17 ENCOUNTER — Ambulatory Visit (HOSPITAL_COMMUNITY)
Admission: RE | Admit: 2018-01-17 | Discharge: 2018-01-17 | Disposition: A | Discharge status: Home / Self Care | Payer: Medicare Other | Source: Ambulatory Visit | Attending: Cardiology | Admitting: Cardiology

## 2018-01-17 ENCOUNTER — Encounter (HOSPITAL_COMMUNITY): Payer: Self-pay | Admitting: Cardiology

## 2018-01-17 ENCOUNTER — Other Ambulatory Visit: Payer: Self-pay

## 2018-01-17 VITALS — BP 142/60 | HR 59 | Wt 155.0 lb

## 2018-01-17 DIAGNOSIS — M109 Gout, unspecified: Secondary | ICD-10-CM | POA: Insufficient documentation

## 2018-01-17 DIAGNOSIS — Z79899 Other long term (current) drug therapy: Secondary | ICD-10-CM | POA: Diagnosis not present

## 2018-01-17 DIAGNOSIS — K219 Gastro-esophageal reflux disease without esophagitis: Secondary | ICD-10-CM | POA: Insufficient documentation

## 2018-01-17 DIAGNOSIS — I5022 Chronic systolic (congestive) heart failure: Secondary | ICD-10-CM

## 2018-01-17 DIAGNOSIS — N183 Chronic kidney disease, stage 3 unspecified: Secondary | ICD-10-CM

## 2018-01-17 DIAGNOSIS — I2510 Atherosclerotic heart disease of native coronary artery without angina pectoris: Secondary | ICD-10-CM | POA: Diagnosis not present

## 2018-01-17 DIAGNOSIS — I255 Ischemic cardiomyopathy: Secondary | ICD-10-CM | POA: Diagnosis not present

## 2018-01-17 DIAGNOSIS — Z951 Presence of aortocoronary bypass graft: Secondary | ICD-10-CM | POA: Diagnosis not present

## 2018-01-17 DIAGNOSIS — Z95810 Presence of automatic (implantable) cardiac defibrillator: Secondary | ICD-10-CM | POA: Insufficient documentation

## 2018-01-17 DIAGNOSIS — E785 Hyperlipidemia, unspecified: Secondary | ICD-10-CM | POA: Insufficient documentation

## 2018-01-17 DIAGNOSIS — I130 Hypertensive heart and chronic kidney disease with heart failure and stage 1 through stage 4 chronic kidney disease, or unspecified chronic kidney disease: Secondary | ICD-10-CM | POA: Insufficient documentation

## 2018-01-17 LAB — BASIC METABOLIC PANEL
Anion gap: 8 (ref 5–15)
BUN: 34 mg/dL — ABNORMAL HIGH (ref 6–20)
CHLORIDE: 106 mmol/L (ref 101–111)
CO2: 27 mmol/L (ref 22–32)
Calcium: 9.4 mg/dL (ref 8.9–10.3)
Creatinine, Ser: 1.64 mg/dL — ABNORMAL HIGH (ref 0.61–1.24)
GFR calc Af Amer: 41 mL/min — ABNORMAL LOW (ref >60)
GFR, EST NON AFRICAN AMERICAN: 35 mL/min — AB (ref >60)
GLUCOSE: 138 mg/dL — AB (ref 65–99)
POTASSIUM: 4.2 mmol/L (ref 3.5–5.1)
Sodium: 141 mmol/L (ref 135–145)

## 2018-01-17 MED ORDER — SPIRONOLACTONE 25 MG PO TABS: 12.5000 mg | ORAL_TABLET | Freq: Every day | ORAL | 3 refills | Status: DC

## 2018-01-17 NOTE — Patient Instructions (Signed)
START Spironolactone 12.5mg  daily.  Routine lab work today. Will notify you of abnormal results  Repeat labs in 10 days (bmet)  Follow up with Dr.McLean in 3 months

## 2018-01-21 NOTE — Progress Notes (Signed)
Advanced Heart Failure Clinic Note   PCP: Dr. Ardeth Perfect HF Cardiology: Dr. Aundra Dubin  84 y.o. with CAD s/p CABG and chronic systolic CHF from ischemic cardiomyopathy presents for HF clinic evaluation.  He has a Medtronic CRT-D device.  Echo in 11/15 showed EF 20-25%. He had VT x 2 in 9/17 terminated by ATP.   Admitted from clinic 06/10/16 with orthostasis and 24 lb weight loss after switch from Lasix to torsemide.  Optivol with fluid index well below threshold and impedance continuing up.  With orthostasis, light headedness, and living alone pt was admitted for dehydration. AKI noted on labwork. Diuretics held on admission and given IV fluid. Entresto stopped. Dizziness/orthostasis improved.  Torsemide resumed 06/13/16, then cut back further on 10/10 and again on 06/15/16 for uptrend in BUN.  Repeat Echo (10/17) showed EF 35-40%, mildly dilated RV with mild to moderately decreased systolic function, severe TR, PASP 48 mmHg.   Echo in 1/19 showed  EF 35% with wall motion abnormalities, mildly decreased RV systolic function, moderate TR.   He returns for followup of CHF. He is stable symptomatically.  Short of breath after walking about 50 yards, no problems walking around his house.  No orthopnea/PND.  No chest pain. Uses cane for balance.  Not lightheaded, no falls.   Medtronic device interrogated: Fluid index < threshold with increased thoracic impedance, no atrial fibrillation.     Labs (9/17): creatinine 1.13, K 3.4, hgb 9.4, BNP 2230 => 2270 Labs (10/17): K 4.1, creatinine 1.56  Labs (11/17): K 3.9, creatinine 1.36, BNP 1183 Labs (3/18): K 4.3, creatinine 1.95 Labs (12/18): K 4.1, creatinine 2.16 Labs (1/19): LDL 66, HDL 47 Labs (4/19): K 4.1, creatinine 1.75  PMH: 1. Atrial flutter: Paroxysmal. 2. PVCs, h/o VT.  3. Chronic systolic CHF: Ischemic cardiomyopathy.  Medtronic CRT-D.   - Echo (11/15): EF 20-25%, mildly dilated LV.   - Echo (10/17) showed EF 35-40%, mildly dilated RV with  mild to moderately decreased systolic function, severe TR, PASP 48 mmHg. - Echo (1/19) with EF 35%, regional WMAs, mildly decreased RV systolic function, moderate TR.  4. H/o SBO 5. HTN 6. Gout  7. Hyperlipidemia 8. GERD 9. CAD: s/p CABG in 5/12 with LIMA-LAD, SVG-ramus, SVG-D1, SVG-PDA. - PCI to OM1 and proximal LCx in 8/12.  10. CKD: Stage 3.  74. BPH  SH: Nonsmoker, lives in apartment, no ETOH.    Family History  Problem Relation Age of Onset  . Heart Problems Mother   . Diabetes Mother   . Heart Problems Brother    ROS: All systems reviewed and negative except as per HPI.   Current Outpatient Medications  Medication Sig Dispense Refill  . acetaminophen (TYLENOL) 500 MG tablet Take 500 mg by mouth 2 (two) times daily as needed for mild pain.    Marland Kitchen acidophilus (RISAQUAD) CAPS capsule Take 1 capsule by mouth daily.    Marland Kitchen allopurinol (ZYLOPRIM) 300 MG tablet Take 300 mg by mouth daily.      Marland Kitchen atorvastatin (LIPITOR) 20 MG tablet Take 20 mg by mouth daily at 6 PM.    . carvedilol (COREG) 6.25 MG tablet take 1 tablet by mouth twice a day WITH A MEAL 60 tablet 11  . escitalopram (LEXAPRO) 10 MG tablet Take 1 tablet (10 mg total) by mouth daily.  0  . esomeprazole (NEXIUM) 20 MG capsule Take 20 mg by mouth every morning.     . famotidine (PEPCID) 20 MG tablet Take 20 mg by mouth daily.     Marland Kitchen  finasteride (PROSCAR) 5 MG tablet Take 5 mg by mouth daily.    Marland Kitchen ketotifen (ZADITOR) 0.025 % ophthalmic solution Place 1 drop into both eyes daily as needed (dry eyes).     Marland Kitchen lisinopril (PRINIVIL,ZESTRIL) 2.5 MG tablet Take 1 tablet (2.5 mg total) by mouth daily. 90 tablet 3  . LORazepam (ATIVAN) 0.5 MG tablet Take 0.5 mg by mouth 2 (two) times daily.     Marland Kitchen NITROSTAT 0.4 MG SL tablet Place 0.4 mg under the tongue every 5 (five) minutes as needed for chest pain (MAX 3 TABLETS).     Marland Kitchen oxyCODONE (OXY IR/ROXICODONE) 5 MG immediate release tablet Take 1 tablet (5 mg total) by mouth every 6 (six) hours as  needed for moderate pain. 30 tablet 0  . PATADAY 0.2 % SOLN Place 1 drop into both eyes daily.     Marland Kitchen RA ASPIRIN ADULT LOW STRENGTH 81 MG EC tablet Take 1 tablet by mouth daily.  0  . tiZANidine (ZANAFLEX) 2 MG tablet Take 1 tablet (2 mg total) by mouth every 8 (eight) hours as needed for muscle spasms. 30 tablet 0  . torsemide (DEMADEX) 20 MG tablet Take 2 tablets (40 mg total) every other day alternating with 1 tablet (20 mg total) every other day. 135 tablet 3  . spironolactone (ALDACTONE) 25 MG tablet Take 0.5 tablets (12.5 mg total) by mouth daily. 15 tablet 3   No current facility-administered medications for this encounter.    BP (!) 142/60   Pulse (!) 59   Wt 155 lb (70.3 kg)   SpO2 98%   BMI 26.61 kg/m    Wt Readings from Last 3 Encounters:  01/17/18 155 lb (70.3 kg)  10/04/17 154 lb (69.9 kg)  09/21/17 153 lb 12 oz (69.7 kg)   General: NAD Neck: No JVD, no thyromegaly or thyroid nodule.  Lungs: Clear to auscultation bilaterally with normal respiratory effort. CV: Nondisplaced PMI.  Heart regular S1/S2, no S3/S4, no murmur.  No peripheral edema.  No carotid bruit.  Normal pedal pulses.  Abdomen: Soft, nontender, no hepatosplenomegaly, no distention.  Skin: Intact without lesions or rashes.  Neurologic: Alert and oriented x 3.  Psych: Normal affect. Extremities: No clubbing or cyanosis.  HEENT: Normal.   Assessment/Plan: 1. Chronic systolic CHF: Ischemic cardiomyopathy.  He has a Medtronic CRT-D device.  Echo in 1/19 showed EF 35% which is stable.  NYHA II-III symptoms.  Euvolemic by exam and Optivol.  - Continue torsemide 40 mg daily alternating with 20 mg daily.  - He has failed Entresto with marked hypotension.  - Continue lisinopril 2.5 mg qhs - Continue Coreg 6.25 mg bid.  - Add spironolactone 12.5 mg daily. BMET today and again in 10 days.  2. CAD: s/p CABG.  No chest pain.  - Continue statin and ASA 81 daily.  Good lipids 1/19.  3. VT: ATP 9/17 was successful.   Has follow up with Dr [name].  4. Tricuspid regurgitation: Moderate on 1/19 echo.   5. CKD III: BMET today.    Followup in 3 months.  Loralie Champagne 01/21/2018

## 2018-01-25 ENCOUNTER — Ambulatory Visit (INDEPENDENT_AMBULATORY_CARE_PROVIDER_SITE_OTHER): Payer: Medicare Other | Admitting: *Deleted

## 2018-01-25 ENCOUNTER — Telehealth: Payer: Self-pay | Admitting: *Deleted

## 2018-01-25 DIAGNOSIS — Z95810 Presence of automatic (implantable) cardiac defibrillator: Secondary | ICD-10-CM

## 2018-01-25 DIAGNOSIS — I5022 Chronic systolic (congestive) heart failure: Secondary | ICD-10-CM

## 2018-01-25 DIAGNOSIS — I472 Ventricular tachycardia, unspecified: Secondary | ICD-10-CM

## 2018-01-25 NOTE — Telephone Encounter (Signed)
ICD transmission this morning- VT with ATP noted on 12/30/17. [name] has had medications changes and labs followed closely by Dr. Aundra Dubin.   LMOM to call back.

## 2018-01-25 NOTE — Progress Notes (Signed)
Remote ICD transmission.   

## 2018-01-26 ENCOUNTER — Telehealth: Payer: Self-pay

## 2018-01-26 NOTE — Telephone Encounter (Signed)
Remote ICM transmission received.  Attempted call to [name], DPR and left detailed message, per DPR, regarding transmission and next ICM scheduled for 02/26/2018.  Advised to return call for any fluid symptoms or questions.

## 2018-01-26 NOTE — Telephone Encounter (Signed)
Reviewed episode with Dr. [name] 01/25/18- no recommendations.

## 2018-01-26 NOTE — Progress Notes (Signed)
EPIC Encounter for ICM Monitoring  Patient Name: [name] is a 84 y.o. male Date: 01/26/2018 Primary Care Physican: [name], MD Primary Cardiologist:Harwani/McLean  Electrophysiologist: Druscilla Brownie Weight:[place] Bi-V Pacing: 95.3%  Therapy Summary VT/VF  Pace-Terminated Episodes 1 of 1 on 12/30/2017        Attempted call to friend, [name].  Left detailed message regarding transmission.  Transmission reviewed.   Device clinic attempted contact with patient regarding ATP on 12/30/2017   Thoracic impedance normal.  Prescribed dosage: Torsemide 20mg  2tablet (40 mg total) every other day alternating with 20 mg every other day.  Recommendations: Left voice mail with ICM number and encouraged to call if experiencing any fluid symptoms..  Follow-up plan: ICM clinic phone appointment on 02/26/2018.   Copy of ICM check sent to Dr. [name].   3 month ICM trend: 01/25/2018    1 Year ICM trend:       [name], RN 01/26/2018 2:04 PM

## 2018-01-30 ENCOUNTER — Other Ambulatory Visit (HOSPITAL_COMMUNITY): Payer: Medicare Other

## 2018-01-31 ENCOUNTER — Ambulatory Visit (HOSPITAL_COMMUNITY)
Admission: RE | Admit: 2018-01-31 | Discharge: 2018-01-31 | Disposition: A | Discharge status: Home / Self Care | Payer: Medicare Other | Source: Ambulatory Visit | Attending: Internal Medicine | Admitting: Internal Medicine

## 2018-01-31 DIAGNOSIS — I5022 Chronic systolic (congestive) heart failure: Secondary | ICD-10-CM | POA: Diagnosis not present

## 2018-01-31 LAB — BASIC METABOLIC PANEL
ANION GAP: 7 (ref 5–15)
BUN: 48 mg/dL — ABNORMAL HIGH (ref 6–20)
CO2: 24 mmol/L (ref 22–32)
Calcium: 8.8 mg/dL — ABNORMAL LOW (ref 8.9–10.3)
Chloride: 103 mmol/L (ref 101–111)
Creatinine, Ser: 2.03 mg/dL — ABNORMAL HIGH (ref 0.61–1.24)
GFR calc non Af Amer: 27 mL/min — ABNORMAL LOW (ref >60)
GFR, EST AFRICAN AMERICAN: 32 mL/min — AB (ref >60)
GLUCOSE: 181 mg/dL — AB (ref 65–99)
POTASSIUM: 4.4 mmol/L (ref 3.5–5.1)
Sodium: 134 mmol/L — ABNORMAL LOW (ref 135–145)

## 2018-02-02 ENCOUNTER — Telehealth (HOSPITAL_COMMUNITY): Payer: Self-pay

## 2018-02-02 DIAGNOSIS — I5022 Chronic systolic (congestive) heart failure: Secondary | ICD-10-CM

## 2018-02-02 MED ORDER — TORSEMIDE 20 MG PO TABS: 20.0000 mg | ORAL_TABLET | Freq: Every day | ORAL | 3 refills | Status: DC

## 2018-02-02 NOTE — Telephone Encounter (Signed)
Notes recorded by Shirley Muscat, RN on 02/02/2018 at 11:40 AM EDT Pt wife aware of results, agreeable to med changes (changes made in [facility]) Lab appointment made (orders placed)   ------  Notes recorded by Shirley Muscat, RN on 02/01/2018 at 12:43 PM EDT Left message to call back   ------  Notes recorded by Larey Dresser, MD on 01/31/2018 at 10:16 PM EDT Decrease torsemide to 20 mg daily, repeat BMET in 10 days.

## 2018-02-02 NOTE — Progress Notes (Signed)
Vanetta Shawl, DPR returned call.  She stated patient is doing fine.  She is waiting on call back from Dr Claris Gladden office for lab results. Advised next remote transmission is 02/26/2018.

## 2018-02-09 ENCOUNTER — Ambulatory Visit (HOSPITAL_COMMUNITY)
Admission: RE | Admit: 2018-02-09 | Discharge: 2018-02-09 | Disposition: A | Discharge status: Home / Self Care | Payer: Medicare Other | Source: Ambulatory Visit | Attending: Cardiology | Admitting: Cardiology

## 2018-02-09 DIAGNOSIS — I5022 Chronic systolic (congestive) heart failure: Secondary | ICD-10-CM | POA: Insufficient documentation

## 2018-02-09 LAB — BASIC METABOLIC PANEL
ANION GAP: 12 (ref 5–15)
BUN: 57 mg/dL — AB (ref 6–20)
CALCIUM: 8.8 mg/dL — AB (ref 8.9–10.3)
CO2: 18 mmol/L — ABNORMAL LOW (ref 22–32)
Chloride: 101 mmol/L (ref 101–111)
Creatinine, Ser: 2.32 mg/dL — ABNORMAL HIGH (ref 0.61–1.24)
GFR calc Af Amer: 27 mL/min — ABNORMAL LOW (ref >60)
GFR, EST NON AFRICAN AMERICAN: 23 mL/min — AB (ref >60)
Glucose, Bld: 236 mg/dL — ABNORMAL HIGH (ref 65–99)
Potassium: 4.5 mmol/L (ref 3.5–5.1)
SODIUM: 131 mmol/L — AB (ref 135–145)

## 2018-02-20 LAB — CUP PACEART REMOTE DEVICE CHECK
Battery Voltage: 2.92 V
Brady Statistic AP VP Percent: 64.3 %
Brady Statistic AS VS Percent: 0.82 %
Brady Statistic RA Percent Paced: 62.01 %
Brady Statistic RV Percent Paced: 95.29 %
HighPow Impedance: 36 Ohm
HighPow Impedance: 46 Ohm
Implantable Lead Implant Date: 20040709
Implantable Lead Implant Date: 20041111
Implantable Lead Implant Date: 20150520
Implantable Lead Location: 753858
Implantable Lead Location: 753859
Implantable Lead Location: 753860
Implantable Lead Model: 5076
Implantable Lead Model: 5076
Implantable Lead Model: 6947
Lead Channel Impedance Value: 304 Ohm
Lead Channel Impedance Value: 399 Ohm
Lead Channel Impedance Value: 4047 Ohm
Lead Channel Impedance Value: 456 Ohm
Lead Channel Pacing Threshold Amplitude: 0.625 V
Lead Channel Pacing Threshold Amplitude: 1.25 V
Lead Channel Pacing Threshold Pulse Width: 0.4 ms
Lead Channel Sensing Intrinsic Amplitude: 3.125 mV
Lead Channel Sensing Intrinsic Amplitude: 3.125 mV
Lead Channel Setting Pacing Amplitude: 2 V
Lead Channel Setting Pacing Pulse Width: 0.4 ms
Lead Channel Setting Sensing Sensitivity: 0.3 mV
MDC IDC LEAD IMPLANT DT: 20130206
MDC IDC LEAD LOCATION: 753860
MDC IDC MSMT BATTERY REMAINING LONGEVITY: 22 mo
MDC IDC MSMT LEADCHNL LV IMPEDANCE VALUE: 285 Ohm
MDC IDC MSMT LEADCHNL LV IMPEDANCE VALUE: 4047 Ohm
MDC IDC MSMT LEADCHNL LV PACING THRESHOLD PULSEWIDTH: 0.8 ms
MDC IDC MSMT LEADCHNL RA PACING THRESHOLD PULSEWIDTH: 0.4 ms
MDC IDC MSMT LEADCHNL RV PACING THRESHOLD AMPLITUDE: 0.625 V
MDC IDC MSMT LEADCHNL RV SENSING INTR AMPL: 4.375 mV
MDC IDC MSMT LEADCHNL RV SENSING INTR AMPL: 4.375 mV
MDC IDC PG IMPLANT DT: 20150520
MDC IDC SESS DTM: 20190523161705
MDC IDC SET LEADCHNL LV PACING AMPLITUDE: 2.5 V
MDC IDC SET LEADCHNL LV PACING PULSEWIDTH: 0.8 ms
MDC IDC SET LEADCHNL RA PACING AMPLITUDE: 1.5 V
MDC IDC STAT BRADY AP VS PERCENT: 0.28 %
MDC IDC STAT BRADY AS VP PERCENT: 34.61 %

## 2018-02-26 ENCOUNTER — Ambulatory Visit (INDEPENDENT_AMBULATORY_CARE_PROVIDER_SITE_OTHER): Payer: Medicare Other

## 2018-02-26 DIAGNOSIS — Z95810 Presence of automatic (implantable) cardiac defibrillator: Secondary | ICD-10-CM | POA: Diagnosis not present

## 2018-02-26 DIAGNOSIS — I5022 Chronic systolic (congestive) heart failure: Secondary | ICD-10-CM | POA: Diagnosis not present

## 2018-02-27 ENCOUNTER — Telehealth: Payer: Self-pay

## 2018-02-27 NOTE — Progress Notes (Signed)
EPIC Encounter for ICM Monitoring  Patient Name: [name] is a 84 y.o. male Date: 02/27/2018 Primary Care Physican: [name], MD Primary Cardiologist:Harwani/McLean  Electrophysiologist: Druscilla Brownie Weight:unkonwn Bi-V Pacing: 95.2%      Attempted call tofriend, [name].  Left detailed message, per DPR, regarding transmission.  Transmission reviewed.    Thoracic impedance normal.  Prescribed dosage: Torsemide 20mg  2tablet (40 mg total) every other dayalternating with 20 mg every other day.  Recommendations: Left voice mail with ICM number and encouraged to call if experiencing any fluid symptoms.  Follow-up plan: ICM clinic phone appointment on 04/03/2018.  Office appointment scheduled 03/27/2018 with Dr. Aundra Dubin.   Copy of ICM check sent to Dr. [name].   3 month ICM trend: 02/26/2018    1 Year ICM trend:       [name], RN 02/27/2018 12:15 PM

## 2018-02-27 NOTE — Telephone Encounter (Signed)
Remote ICM transmission received.  Attempted call to friend [name] and left detailed message, per DPR, regarding transmission and next ICM scheduled for 04/03/2018.  Advised to return call for any fluid symptoms or questions.

## 2018-03-05 ENCOUNTER — Telehealth (HOSPITAL_COMMUNITY): Payer: Self-pay

## 2018-03-05 NOTE — Telephone Encounter (Signed)
Called b/c due to pt's LDL that was faxed in was 120. Per Dr. Aundra Dubin  Make sure pt is taking Atorvastatin 20 mg daily. If not he should take, if so increase to 40 mg and get lipid's/lft in 2 months.   Unable to reach pt Left message to call back

## 2018-03-06 MED ORDER — ATORVASTATIN CALCIUM 40 MG PO TABS: 40.0000 mg | ORAL_TABLET | Freq: Every day | ORAL | 6 refills | Status: DC

## 2018-03-06 NOTE — Telephone Encounter (Signed)
Pt's caregiver called back, she is aware she states he takes the Atorvastatin most of the time, he only misses a few doses.  New rx for higher dose sent in.  Will sch lab work at appt on 7/23

## 2018-03-27 ENCOUNTER — Ambulatory Visit (HOSPITAL_COMMUNITY)
Admission: RE | Admit: 2018-03-27 | Discharge: 2018-03-27 | Disposition: A | Discharge status: Home / Self Care | Payer: Medicare Other | Source: Ambulatory Visit | Attending: Cardiology | Admitting: Cardiology

## 2018-03-27 VITALS — BP 115/87 | HR 63 | Wt 154.8 lb

## 2018-03-27 DIAGNOSIS — Z8249 Family history of ischemic heart disease and other diseases of the circulatory system: Secondary | ICD-10-CM | POA: Diagnosis not present

## 2018-03-27 DIAGNOSIS — I130 Hypertensive heart and chronic kidney disease with heart failure and stage 1 through stage 4 chronic kidney disease, or unspecified chronic kidney disease: Secondary | ICD-10-CM | POA: Insufficient documentation

## 2018-03-27 DIAGNOSIS — N400 Enlarged prostate without lower urinary tract symptoms: Secondary | ICD-10-CM | POA: Insufficient documentation

## 2018-03-27 DIAGNOSIS — E785 Hyperlipidemia, unspecified: Secondary | ICD-10-CM | POA: Diagnosis not present

## 2018-03-27 DIAGNOSIS — I255 Ischemic cardiomyopathy: Secondary | ICD-10-CM | POA: Diagnosis not present

## 2018-03-27 DIAGNOSIS — Z79891 Long term (current) use of opiate analgesic: Secondary | ICD-10-CM | POA: Insufficient documentation

## 2018-03-27 DIAGNOSIS — I4892 Unspecified atrial flutter: Secondary | ICD-10-CM | POA: Diagnosis not present

## 2018-03-27 DIAGNOSIS — I5022 Chronic systolic (congestive) heart failure: Secondary | ICD-10-CM | POA: Insufficient documentation

## 2018-03-27 DIAGNOSIS — Z951 Presence of aortocoronary bypass graft: Secondary | ICD-10-CM

## 2018-03-27 DIAGNOSIS — Z79899 Other long term (current) drug therapy: Secondary | ICD-10-CM | POA: Insufficient documentation

## 2018-03-27 DIAGNOSIS — M109 Gout, unspecified: Secondary | ICD-10-CM | POA: Insufficient documentation

## 2018-03-27 DIAGNOSIS — K219 Gastro-esophageal reflux disease without esophagitis: Secondary | ICD-10-CM | POA: Diagnosis not present

## 2018-03-27 DIAGNOSIS — I071 Rheumatic tricuspid insufficiency: Secondary | ICD-10-CM | POA: Insufficient documentation

## 2018-03-27 DIAGNOSIS — I472 Ventricular tachycardia: Secondary | ICD-10-CM | POA: Insufficient documentation

## 2018-03-27 DIAGNOSIS — Z833 Family history of diabetes mellitus: Secondary | ICD-10-CM | POA: Diagnosis not present

## 2018-03-27 DIAGNOSIS — Z792 Long term (current) use of antibiotics: Secondary | ICD-10-CM | POA: Diagnosis not present

## 2018-03-27 DIAGNOSIS — Z09 Encounter for follow-up examination after completed treatment for conditions other than malignant neoplasm: Secondary | ICD-10-CM | POA: Insufficient documentation

## 2018-03-27 DIAGNOSIS — I2510 Atherosclerotic heart disease of native coronary artery without angina pectoris: Secondary | ICD-10-CM | POA: Insufficient documentation

## 2018-03-27 LAB — BASIC METABOLIC PANEL
Anion gap: 11 (ref 5–15)
BUN: 54 mg/dL — ABNORMAL HIGH (ref 8–23)
CO2: 22 mmol/L (ref 22–32)
CREATININE: 1.9 mg/dL — AB (ref 0.61–1.24)
Calcium: 9.4 mg/dL (ref 8.9–10.3)
Chloride: 101 mmol/L (ref 98–111)
GFR calc non Af Amer: 30 mL/min — ABNORMAL LOW (ref >60)
GFR, EST AFRICAN AMERICAN: 34 mL/min — AB (ref >60)
Glucose, Bld: 184 mg/dL — ABNORMAL HIGH (ref 70–99)
Potassium: 4.7 mmol/L (ref 3.5–5.1)
Sodium: 134 mmol/L — ABNORMAL LOW (ref 135–145)

## 2018-03-27 MED ORDER — CARVEDILOL 6.25 MG PO TABS: 9.3750 mg | ORAL_TABLET | Freq: Two times a day (BID) | ORAL | 11 refills | Status: DC

## 2018-03-27 NOTE — Patient Instructions (Signed)
Labs today (will call for abnormal results, otherwise no news is good news)  INCREASE Carvedilol to 9.375 mg (1.5 Tablet) Twice Daily.  Follow up in 2 Months

## 2018-03-28 NOTE — Progress Notes (Signed)
Advanced Heart Failure Clinic Note   PCP: Dr. Ardeth Perfect HF Cardiology: Dr. Aundra Dubin  84 y.o. with CAD s/p CABG and chronic systolic CHF from ischemic cardiomyopathy presents for HF clinic evaluation.  He has a Medtronic CRT-D device.  Echo in 11/15 showed EF 20-25%. He had VT x 2 in 9/17 terminated by ATP.   Admitted from clinic 06/10/16 with orthostasis and 24 lb weight loss after switch from Lasix to torsemide.  Optivol with fluid index well below threshold and impedance continuing up.  With orthostasis, light headedness, and living alone pt was admitted for dehydration. AKI noted on labwork. Diuretics held on admission and given IV fluid. Entresto stopped. Dizziness/orthostasis improved.  Torsemide resumed 06/13/16, then cut back further on 10/10 and again on 06/15/16 for uptrend in BUN.  Repeat Echo (10/17) showed EF 35-40%, mildly dilated RV with mild to moderately decreased systolic function, severe TR, PASP 48 mmHg.   Echo in 1/19 showed  EF 35% with wall motion abnormalities, mildly decreased RV systolic function, moderate TR.   He returns for followup of CHF. He is stable symptomatically. Weight down 1 lb.   He is short of breath walking about 50 yards (stable).  No chest pain.  No orthopnea/PND.  No lightheadedness.  He is off lisinopril and spironolactone with recent rise in creatinine.   Medtronic device interrogated: Fluid index < threshold with stable thoracic impedance, no atrial fibrillation or VT.     Labs (9/17): creatinine 1.13, K 3.4, hgb 9.4, BNP 2230 => 2270 Labs (10/17): K 4.1, creatinine 1.56  Labs (11/17): K 3.9, creatinine 1.36, BNP 1183 Labs (3/18): K 4.3, creatinine 1.95 Labs (12/18): K 4.1, creatinine 2.16 Labs (1/19): LDL 66, HDL 47 Labs (4/19): K 4.1, creatinine 1.75 Labs (6/19): K 4.5, creatinine 2.32  PMH: 1. Atrial flutter: Paroxysmal. 2. PVCs, h/o VT.  3. Chronic systolic CHF: Ischemic cardiomyopathy.  Medtronic CRT-D.   - Echo (11/15): EF 20-25%, mildly  dilated LV.   - Echo (10/17) showed EF 35-40%, mildly dilated RV with mild to moderately decreased systolic function, severe TR, PASP 48 mmHg. - Echo (1/19) with EF 35%, regional WMAs, mildly decreased RV systolic function, moderate TR.  4. H/o SBO 5. HTN 6. Gout  7. Hyperlipidemia 8. GERD 9. CAD: s/p CABG in 5/12 with LIMA-LAD, SVG-ramus, SVG-D1, SVG-PDA. - PCI to OM1 and proximal LCx in 8/12.  10. CKD: Stage 3.  9. BPH  SH: Nonsmoker, lives in apartment, no ETOH.    Family History  Problem Relation Age of Onset  . Heart Problems Mother   . Diabetes Mother   . Heart Problems Brother    ROS: All systems reviewed and negative except as per HPI.   Current Outpatient Medications  Medication Sig Dispense Refill  . acetaminophen (TYLENOL) 500 MG tablet Take 500 mg by mouth 2 (two) times daily as needed for mild pain.    Marland Kitchen allopurinol (ZYLOPRIM) 300 MG tablet Take 300 mg by mouth daily.      Marland Kitchen atorvastatin (LIPITOR) 40 MG tablet Take 1 tablet (40 mg total) by mouth daily at 6 PM. 30 tablet 6  . carvedilol (COREG) 6.25 MG tablet Take 1.5 tablets (9.375 mg total) by mouth 2 (two) times daily with a meal. 90 tablet 11  . escitalopram (LEXAPRO) 10 MG tablet Take 1 tablet (10 mg total) by mouth daily.  0  . esomeprazole (NEXIUM) 20 MG capsule Take 20 mg by mouth every morning.     . famotidine (PEPCID)  20 MG tablet Take 20 mg by mouth daily.     . finasteride (PROSCAR) 5 MG tablet Take 5 mg by mouth daily.    Marland Kitchen ketotifen (ZADITOR) 0.025 % ophthalmic solution Place 1 drop into both eyes daily as needed (dry eyes).     . LORazepam (ATIVAN) 0.5 MG tablet Take 0.5 mg by mouth 2 (two) times daily.     Marland Kitchen NITROSTAT 0.4 MG SL tablet Place 0.4 mg under the tongue every 5 (five) minutes as needed for chest pain (MAX 3 TABLETS).     Marland Kitchen oxyCODONE (OXY IR/ROXICODONE) 5 MG immediate release tablet Take 1 tablet (5 mg total) by mouth every 6 (six) hours as needed for moderate pain. 30 tablet 0  . PATADAY  0.2 % SOLN Place 1 drop into both eyes daily.     Marland Kitchen RA ASPIRIN ADULT LOW STRENGTH 81 MG EC tablet Take 1 tablet by mouth daily.  0  . tiZANidine (ZANAFLEX) 2 MG tablet Take 1 tablet (2 mg total) by mouth every 8 (eight) hours as needed for muscle spasms. 30 tablet 0  . torsemide (DEMADEX) 20 MG tablet Take 1 tablet (20 mg total) by mouth daily. 30 tablet 3  . acidophilus (RISAQUAD) CAPS capsule Take 1 capsule by mouth daily.     No current facility-administered medications for this encounter.    BP 115/87   Pulse 63   Wt 154 lb 12.8 oz (70.2 kg)   SpO2 97%   BMI 26.57 kg/m    Wt Readings from Last 3 Encounters:  03/27/18 154 lb 12.8 oz (70.2 kg)  01/17/18 155 lb (70.3 kg)  10/04/17 154 lb (69.9 kg)   General: NAD Neck: No JVD, no thyromegaly or thyroid nodule.  Lungs: Clear to auscultation bilaterally with normal respiratory effort. CV: Nondisplaced PMI.  Heart regular S1/S2, no S3/S4, no murmur.  No peripheral edema.  No carotid bruit.  Normal pedal pulses.  Abdomen: Soft, nontender, no hepatosplenomegaly, no distention.  Skin: Intact without lesions or rashes.  Neurologic: Alert and oriented x 3.  Psych: Normal affect. Extremities: No clubbing or cyanosis.  HEENT: Normal.   Assessment/Plan: 1. Chronic systolic CHF: Ischemic cardiomyopathy.  He has a Medtronic CRT-D device.  Echo in 1/19 showed EF 35% which is stable.  NYHA II-III symptoms.  Euvolemic by exam and Optivol.  Lisinopril and spironolactone recently stopped with elevated creatinine.  - Continue torsemide 20 mg daily. BMET today.  - He has failed Entresto with marked hypotension.  - Off lisinopril and creatinine with rise in creatinine.  - Increase Coreg to 9.375 mg bid.   2. CAD: s/p CABG.  No chest pain.  - Continue statin and ASA 81 daily.   3. VT: ATP 9/17 was successful.  No VT recently.   4. Tricuspid regurgitation: Moderate on 1/19 echo.   5. CKD III: BMET today.    Followup in 2 months.  Loralie Champagne 03/28/2018

## 2018-04-03 ENCOUNTER — Ambulatory Visit (INDEPENDENT_AMBULATORY_CARE_PROVIDER_SITE_OTHER): Payer: Medicare Other

## 2018-04-03 ENCOUNTER — Telehealth: Payer: Self-pay

## 2018-04-03 DIAGNOSIS — Z95810 Presence of automatic (implantable) cardiac defibrillator: Secondary | ICD-10-CM

## 2018-04-03 DIAGNOSIS — I5022 Chronic systolic (congestive) heart failure: Secondary | ICD-10-CM

## 2018-04-03 NOTE — Telephone Encounter (Signed)
Remote ICM transmission received.  Attempted call to [name] and left detailed message, per DPR, regarding transmission and next ICM scheduled for 04/19/2018.  Advised to return call for any fluid symptoms or questions.

## 2018-04-03 NOTE — Progress Notes (Signed)
EPIC Encounter for ICM Monitoring  Patient Name: [name] is a 84 y.o. male Date: 04/03/2018 Primary Care Physican: [name], MD Primary Cardiologist:Harwani/McLean  Electrophysiologist: Druscilla Brownie Weight:[place]wn Bi-V Pacing: 95.3%      Attempted call tofriend, [name].  Left detailed message, per DPR, regarding transmission.  Transmission reviewed.    Thoracic impedance abnormal suggesting fluid accumulation starting 03/27/2018.  Prescribed dosage: Torsemide 20 mg take 1 tablet daily.  Labs: 03/27/2018 Creatinine 1.90, BUN 54, Potassium 4.7, Sodium 134, EGFR 30-34 02/09/2018 Creatinine 2.32, BUN 57, Potassium 4.5, Sodium 131, EGFR 23-27  01/31/2018 Creatinine 2.03, BUN 48, Potassium 4.4, Sodium 134, EGFR 27-32  01/17/2018 Creatinine 1.64, BUN 34, Potassium 4.2, Sodium 141, EGFR 35-41  12/25/2017 Creatinine 1.75, BUN 28, Potassium 4.1, Sodium 135, EGFR 33-38 09/21/2017 Creatinine 2.15, BUN 40, Potassium 4.6, Sodium 138, EGFR 26-30 08/25/2017 Creatinine 2.16, BUN 42, Potassium 4.1, Sodium 134, EGFR 26-30  Recommendations: Left voice mail with ICM number and encouraged to call if experiencing any fluid symptoms.  Follow-up plan: ICM clinic phone appointment on 04/19/2018 to recheck fluid levels.   Office appointment scheduled 05/29/2018 with Dr. Aundra Dubin.    Copy of ICM check sent to Dr [name] and Dr Aundra Dubin for review and if any recommendations will call back.   3 month ICM trend: 04/03/2018    1 Year ICM trend:       [name], RN 04/03/2018 8:54 AM

## 2018-04-04 NOTE — Progress Notes (Signed)
Take torsemide 40 mg daily x 3 days then back to 20 mg daily.

## 2018-04-05 NOTE — Progress Notes (Signed)
Spoke with friend, [name].  Advised Dr Aundra Dubin ordered patient to take Torsemide 40 mg daily x 3 days and then return to 20 mg daily. She verbalized understanding.  Recheck fluid levels on 04/12/2018.

## 2018-04-12 ENCOUNTER — Telehealth: Payer: Self-pay

## 2018-04-12 ENCOUNTER — Ambulatory Visit (INDEPENDENT_AMBULATORY_CARE_PROVIDER_SITE_OTHER): Payer: Medicare Other

## 2018-04-12 DIAGNOSIS — I5022 Chronic systolic (congestive) heart failure: Secondary | ICD-10-CM | POA: Diagnosis not present

## 2018-04-12 DIAGNOSIS — Z95810 Presence of automatic (implantable) cardiac defibrillator: Secondary | ICD-10-CM | POA: Diagnosis not present

## 2018-04-12 NOTE — Telephone Encounter (Signed)
Spoke with [name] ok per DPR she stated that pt didn't say anything about feeling abnormal and that hes been taking his medications as prescribed as far as she knows, she stated that pt doesn't drive. [name] stated that she would talk to pt regarding date and time of the episode and see if he remembered anything.

## 2018-04-12 NOTE — Telephone Encounter (Signed)
LVM on home and cell phone for pt to call back to discuss ATP episode from 04/11/18@4 :44am.

## 2018-04-13 NOTE — Progress Notes (Signed)
EPIC Encounter for ICM Monitoring  Patient Name: [name] is a 84 y.o. male Date: 04/13/2018 Primary Care Physican: [name], MD Primary Cardiologist:Harwani/McLean  Electrophysiologist: Druscilla Brownie Weight:unkonwn Bi-V Pacing: 94.7%  Clinical Status (03-Apr-2018 to 12-Apr-2018) Treated VT/VF 1 episode      Attempted call tofriend, [name]. Left detailed message, per DPR, regarding transmission.  Transmission reviewed.  Treated VT addressed by device clinic on 04/12/2018   Thoracic impedance returned to normal after taking Torsemide 40 mg daily x 3 days.  Prescribed dosage: Torsemide 20 mg take 1 tablet daily.  Labs: 03/27/2018 Creatinine 1.90, BUN 54, Potassium 4.7, Sodium 134, EGFR 30-34 02/09/2018 Creatinine 2.32, BUN 57, Potassium 4.5, Sodium 131, EGFR 23-27  01/31/2018 Creatinine 2.03, BUN 48, Potassium 4.4, Sodium 134, EGFR 27-32  01/17/2018 Creatinine 1.64, BUN 34, Potassium 4.2, Sodium 141, EGFR 35-41  12/25/2017 Creatinine 1.75, BUN 28, Potassium 4.1, Sodium 135, EGFR 33-38 09/21/2017 Creatinine 2.15, BUN 40, Potassium 4.6, Sodium 138, EGFR 26-30 08/25/2017 Creatinine 2.16, BUN 42, Potassium 4.1, Sodium 134, EGFR 26-30  Recommendations:   Left voice mail with ICM number and encouraged to call if experiencing any fluid symptoms.  Follow-up plan: ICM clinic phone appointment on 05/10/2018.   Office appointment scheduled 05/29/2018 with Dr. Aundra Dubin.    Copy of ICM check sent to Dr. [name].   3 month ICM trend: 04/12/2018    1 Year ICM trend:       [name], RN 04/13/2018 12:34 PM

## 2018-05-10 ENCOUNTER — Ambulatory Visit (INDEPENDENT_AMBULATORY_CARE_PROVIDER_SITE_OTHER): Payer: Medicare Other | Admitting: *Deleted

## 2018-05-10 ENCOUNTER — Ambulatory Visit (INDEPENDENT_AMBULATORY_CARE_PROVIDER_SITE_OTHER): Payer: Medicare Other

## 2018-05-10 DIAGNOSIS — I5022 Chronic systolic (congestive) heart failure: Secondary | ICD-10-CM | POA: Diagnosis not present

## 2018-05-10 DIAGNOSIS — Z95810 Presence of automatic (implantable) cardiac defibrillator: Secondary | ICD-10-CM | POA: Diagnosis not present

## 2018-05-10 DIAGNOSIS — I255 Ischemic cardiomyopathy: Secondary | ICD-10-CM

## 2018-05-10 NOTE — Progress Notes (Signed)
Remote ICD transmission.   

## 2018-05-11 ENCOUNTER — Telehealth: Payer: Self-pay

## 2018-05-11 NOTE — Telephone Encounter (Signed)
Remote ICM transmission received.  Attempted call to [name], DPR  and unable to leave message due to phone disconnected during recording.

## 2018-05-11 NOTE — Progress Notes (Signed)
EPIC Encounter for ICM Monitoring  Patient Name: [name] is a 84 y.o. male Date: 05/11/2018 Primary Care Physican: [name], MD Primary Cardiologist:Harwani/McLean  Electrophysiologist: [name] Dry Weight:[place]wn Bi-V Pacing: 94.7%  Clinical Status (20-Apr-2018 to 10-May-2018)  Treated VT/VF 0 episodes  AT/AF 8 episodes   Time in AT/AF <0.1 hr/day (0.3%)       Attempted call tofriend, [name] and unable to reach.  Transmission reviewed.   Thoracic impedance abnormal suggesting fluid accumulation starting 04/27/2018.  Prescribed dosage: Torsemide20 mg take 1 tablet daily.  Labs: 03/27/2018 Creatinine1.90, BUN54, Potassium4.7, Sodium134, [id number] 02/09/2018 Creatinine2.32, BUN57, Potassium4.5, Sodium131, [id number]  01/31/2018 Creatinine2.03, BUN48, Potassium4.4, Sodium134, [id number]  01/17/2018 Creatinine1.64, BUN34, Potassium4.2, [id number], [id number] 12/25/2017 Creatinine 1.75, BUN 28, Potassium 4.1, Sodium 135, EGFR 33-38 09/21/2017 Creatinine 2.15, BUN 40, Potassium 4.6, Sodium 138, EGFR 26-30 08/25/2017 Creatinine 2.16, BUN 42, Potassium 4.1, Sodium 134, EGFR 26-30  Recommendations: NONE - Unable to reach.  Follow-up plan: ICM clinic phone appointment on 05/17/2018 to recheck fluid levels.   Office appointment scheduled 05/29/2018 with Dr. Aundra Dubin.    Copy of ICM check sent to Dr. [name].   3 month ICM trend: 04/09/2018    1 Year ICM trend:       [name], RN 05/11/2018 8:34 AM

## 2018-05-14 NOTE — Progress Notes (Signed)
Increase torsemide to 40 mg daily for now.  BMET in 10 days.

## 2018-05-15 ENCOUNTER — Telehealth: Payer: Self-pay

## 2018-05-15 NOTE — Telephone Encounter (Signed)
LMOVM reminding pt to send remote transmission. For pt monthly check up with Margarita Grizzle.

## 2018-05-17 ENCOUNTER — Ambulatory Visit (INDEPENDENT_AMBULATORY_CARE_PROVIDER_SITE_OTHER): Payer: Medicare Other

## 2018-05-17 DIAGNOSIS — Z95810 Presence of automatic (implantable) cardiac defibrillator: Secondary | ICD-10-CM

## 2018-05-17 DIAGNOSIS — I5022 Chronic systolic (congestive) heart failure: Secondary | ICD-10-CM

## 2018-05-17 NOTE — Progress Notes (Signed)
EPIC Encounter for ICM Monitoring  Patient Name: [name] is a 84 y.o. male Date: 05/17/2018 Primary Care Physican: [name], MD Primary Cardiologist:Harwani/McLean  Electrophysiologist: Druscilla Brownie Weight:[place]onwn Bi-V Pacing: 94.4%      Spoke with friend, [name].  She said pt is asymptomatic.  She checks on patient routinely and will call him today to confirm he has no fluid symptoms.   Diet consist of meals on wheels, beanie weenies, ham and other foods that are easy to fix for himself.   Thoracic impedance abnormal suggesting fluid accumulation starting 04/27/2018.  Prescribed dosage: Torsemide20 mg take 1 tablet daily.  Labs: 03/27/2018 Creatinine1.90, BUN54, Potassium4.7, Sodium134, [id number] 02/09/2018 Creatinine2.32, BUN57, Potassium4.5, Sodium131, [id number]  01/31/2018 Creatinine2.03, BUN48, Potassium4.4, Sodium134, [id number]  01/17/2018 Creatinine1.64, BUN34, Potassium4.2, [id number], [id number] 12/25/2017 Creatinine 1.75, BUN 28, Potassium 4.1, Sodium 135, EGFR 33-38 09/21/2017 Creatinine 2.15, BUN 40, Potassium 4.6, Sodium 138, EGFR 26-30 08/25/2017 Creatinine 2.16, BUN 42, Potassium 4.1, Sodium 134, EGFR 26-30  Recommendations:  Advised to decrease salt intake and to call for fluid symptoms.  Will call back if any recommendations from Dr Aundra Dubin.  Follow-up plan: ICM clinic phone appointment on 05/21/2018 (manual send) to recheck fluid levels.   Office appointment scheduled 05/29/2018 with Dr. Aundra Dubin.    Copy of ICM check sent to Dr. [name] and Dr Aundra Dubin for review and if any recommendations will call back.    3 month ICM trend: 05/17/2018    1 Year ICM trend:       [name], RN 05/17/2018 11:25 AM

## 2018-05-18 ENCOUNTER — Other Ambulatory Visit: Payer: Self-pay | Admitting: *Deleted

## 2018-05-18 NOTE — Patient Outreach (Signed)
[facility]) Care Management  05/18/2018   [name] 08-Dec-1926 [id number]  RN Health Coach telephone call to patient.  [employer] compliance verified. RN Health Coach spoke with patient caregiver [name]. Per care giver states that she asks the patient did he weigh today and patient states yes. Per [name] the patient has a talking scale. She stated that she is going by what he says. Per [name] the Dr office called and tells her when he has fluid and to increase his torsemide. RN discussed with caregiver about the patient being followed by [facility] Heart Failure program and she agreed. She stated that she would be the contact person. The patient lives alone and gets meals on wheels. [name] checks on him.  She does his shopping and sets up medication and bills.  Current Medications:  Current Outpatient Medications  Medication Sig Dispense Refill  . acetaminophen (TYLENOL) 500 MG tablet Take 500 mg by mouth 2 (two) times daily as needed for mild pain.    Marland Kitchen acidophilus (RISAQUAD) CAPS capsule Take 1 capsule by mouth daily.    Marland Kitchen allopurinol (ZYLOPRIM) 300 MG tablet Take 300 mg by mouth daily.      Marland Kitchen atorvastatin (LIPITOR) 40 MG tablet Take 1 tablet (40 mg total) by mouth daily at 6 PM. 30 tablet 6  . carvedilol (COREG) 6.25 MG tablet Take 1.5 tablets (9.375 mg total) by mouth 2 (two) times daily with a meal. 90 tablet 11  . escitalopram (LEXAPRO) 10 MG tablet Take 1 tablet (10 mg total) by mouth daily.  0  . esomeprazole (NEXIUM) 20 MG capsule Take 20 mg by mouth every morning.     . famotidine (PEPCID) 20 MG tablet Take 20 mg by mouth daily.     . finasteride (PROSCAR) 5 MG tablet Take 5 mg by mouth daily.    Marland Kitchen ketotifen (ZADITOR) 0.025 % ophthalmic solution Place 1 drop into both eyes daily as needed (dry eyes).     . LORazepam (ATIVAN) 0.5 MG tablet Take 0.5 mg by mouth 2 (two) times daily.     Marland Kitchen NITROSTAT 0.4 MG SL tablet Place 0.4 mg under the tongue every 5 (five) minutes as  needed for chest pain (MAX 3 TABLETS).     Marland Kitchen oxyCODONE (OXY IR/ROXICODONE) 5 MG immediate release tablet Take 1 tablet (5 mg total) by mouth every 6 (six) hours as needed for moderate pain. 30 tablet 0  . PATADAY 0.2 % SOLN Place 1 drop into both eyes daily.     Marland Kitchen RA ASPIRIN ADULT LOW STRENGTH 81 MG EC tablet Take 1 tablet by mouth daily.  0  . tiZANidine (ZANAFLEX) 2 MG tablet Take 1 tablet (2 mg total) by mouth every 8 (eight) hours as needed for muscle spasms. 30 tablet 0  . torsemide (DEMADEX) 20 MG tablet Take 1 tablet (20 mg total) by mouth daily. 30 tablet 3   No current facility-administered medications for this visit.     Functional Status:  In your present state of health, do you have any difficulty performing the following activities: 05/18/2018  Hearing? N  Vision? Y  Difficulty concentrating or making decisions? N  Walking or climbing stairs? Y  Dressing or bathing? Y  Doing errands, shopping? Y  Preparing Food and eating ? Y  Using the Toilet? N  In the past six months, have you accidently leaked urine? Y  Do you have problems with loss of bowel control? N  Managing your Medications? [name]  Managing your Finances? Y  Housekeeping or managing your Housekeeping? Y  Some recent data might be hidden    Fall/Depression Screening: Fall Risk  05/18/2018 02/16/2017 01/02/2017  Falls in the past year? Yes No No  Number falls in past yr: 1 - -  Injury with Fall? - - -  Risk for fall due to : History of fall(s);Impaired mobility;Impaired balance/gait Impaired balance/gait;Impaired mobility Impaired balance/gait;Impaired mobility  Follow up Falls evaluation completed;Falls prevention discussed - -   PHQ 2/9 Scores 05/18/2018 02/16/2017 01/02/2017 11/15/2016 10/13/2016 09/15/2016 06/09/2016  PHQ - 2 Score 0 0 0 0 0 0 1  Exception Documentation - - - - - - -  Not completed - - - - - - -   [facility] CM Care Plan Problem One     Most Recent Value  Care Plan Problem One  Knowledge Deficit Heart  Failure  Role Documenting the Problem One  [place] for Problem One  Active  [facility] Long Term Goal   Patient will weigh self daily and report increase weight gain to caregiver to report to physician  [place] Term Goal Start Date  05/18/18  Interventions for Problem One Long Term Goal  RN discussed with caregiver about Patient weight gain. Dr office has been notifying care giver when to give medication. RN discussed referring to [place] Failure program. Caregiver agreed, RN will refer.     Assessment:  Patient says he is weighing Patient is taking medication as per ordered and Caregiver tells patient when to take the additional torsemide  Plan:  RN will refer to [place] Management [phone]

## 2018-05-18 NOTE — Progress Notes (Signed)
Returned call from [name], friend asking if any recommendations to increase Torsemide at this time.  Advised no recommendations received so he should continue to take prescribed dosage.  He denied any fluid symptoms and told her he feels fine.

## 2018-05-20 NOTE — Progress Notes (Signed)
Take torsemide 40 mg daily x 2 days then torsemide 40 mg daily alternating with 20 mg daily.  BMET 1 week.

## 2018-05-21 MED ORDER — TORSEMIDE 20 MG PO TABS: ORAL_TABLET | ORAL | 3 refills | Status: DC

## 2018-05-21 NOTE — Progress Notes (Signed)
Spoke with friend, [name].  Advised Dr Aundra Dubin changed Torsemide.  He should take Take torsemide 20 mg 2 tablets (40 mg total) daily x 2 days then Torsemide 20 mg take 2 tablets (40 mg total) every other day alternating with 20 mg every other day. Advised he ordered BMET in a week and she reported patient has an appointment with Dr Aundra Dubin next week and would like to have the labs drawn at that time.  Advised that would be fine and I would inform Dr Aundra Dubin that he will have BMET drawn 9/24 at time of appointment.  Patient has enough supply on hand for Torsemide dosage increase.  Next ICM remote transmission scheduled 06/21/2018.  Advised to call back to [facility] clinic or HF clinic for any changes.

## 2018-05-29 ENCOUNTER — Ambulatory Visit (HOSPITAL_COMMUNITY)
Admission: RE | Admit: 2018-05-29 | Discharge: 2018-05-29 | Disposition: A | Discharge status: Home / Self Care | Payer: Medicare Other | Source: Ambulatory Visit | Attending: Cardiology | Admitting: Cardiology

## 2018-05-29 VITALS — BP 134/78 | HR 65 | Wt 155.4 lb

## 2018-05-29 DIAGNOSIS — I472 Ventricular tachycardia, unspecified: Secondary | ICD-10-CM

## 2018-05-29 DIAGNOSIS — E785 Hyperlipidemia, unspecified: Secondary | ICD-10-CM | POA: Insufficient documentation

## 2018-05-29 DIAGNOSIS — I2510 Atherosclerotic heart disease of native coronary artery without angina pectoris: Secondary | ICD-10-CM | POA: Diagnosis not present

## 2018-05-29 DIAGNOSIS — M109 Gout, unspecified: Secondary | ICD-10-CM | POA: Diagnosis not present

## 2018-05-29 DIAGNOSIS — I5022 Chronic systolic (congestive) heart failure: Secondary | ICD-10-CM | POA: Insufficient documentation

## 2018-05-29 DIAGNOSIS — K219 Gastro-esophageal reflux disease without esophagitis: Secondary | ICD-10-CM | POA: Diagnosis not present

## 2018-05-29 DIAGNOSIS — Z951 Presence of aortocoronary bypass graft: Secondary | ICD-10-CM | POA: Insufficient documentation

## 2018-05-29 DIAGNOSIS — I130 Hypertensive heart and chronic kidney disease with heart failure and stage 1 through stage 4 chronic kidney disease, or unspecified chronic kidney disease: Secondary | ICD-10-CM | POA: Diagnosis not present

## 2018-05-29 DIAGNOSIS — Z79899 Other long term (current) drug therapy: Secondary | ICD-10-CM | POA: Insufficient documentation

## 2018-05-29 DIAGNOSIS — I4892 Unspecified atrial flutter: Secondary | ICD-10-CM | POA: Insufficient documentation

## 2018-05-29 DIAGNOSIS — N183 Chronic kidney disease, stage 3 unspecified: Secondary | ICD-10-CM

## 2018-05-29 DIAGNOSIS — I255 Ischemic cardiomyopathy: Secondary | ICD-10-CM | POA: Diagnosis not present

## 2018-05-29 DIAGNOSIS — N400 Enlarged prostate without lower urinary tract symptoms: Secondary | ICD-10-CM | POA: Diagnosis not present

## 2018-05-29 LAB — BASIC METABOLIC PANEL
ANION GAP: 10 (ref 5–15)
BUN: 41 mg/dL — AB (ref 8–23)
CO2: 25 mmol/L (ref 22–32)
Calcium: 9.2 mg/dL (ref 8.9–10.3)
Chloride: 98 mmol/L (ref 98–111)
Creatinine, Ser: 2.06 mg/dL — ABNORMAL HIGH (ref 0.61–1.24)
GFR, EST AFRICAN AMERICAN: 31 mL/min — AB (ref >60)
GFR, EST NON AFRICAN AMERICAN: 27 mL/min — AB (ref >60)
Glucose, Bld: 233 mg/dL — ABNORMAL HIGH (ref 70–99)
POTASSIUM: 3.8 mmol/L (ref 3.5–5.1)
SODIUM: 133 mmol/L — AB (ref 135–145)

## 2018-05-29 MED ORDER — SPIRONOLACTONE 25 MG PO TABS: 12.5000 mg | ORAL_TABLET | Freq: Every day | ORAL | 3 refills | Status: DC

## 2018-05-29 NOTE — Patient Instructions (Signed)
Labs today (will call for abnormal results, otherwise no news is good news)  START Taking Spironolactone 12.5 mg (0.5 Tablet) Once Daily.  Labs in 10 days (bmet)  Follow up in 3 months.

## 2018-05-30 NOTE — Progress Notes (Signed)
Advanced Heart Failure Clinic Note   PCP: Dr. Ardeth Perfect HF Cardiology: Dr. Aundra Dubin  84 y.o. with CAD s/p CABG and chronic systolic CHF from ischemic cardiomyopathy presents for HF clinic evaluation.  He has a Medtronic CRT-D device.  Echo in 11/15 showed EF 20-25%. He had VT x 2 in 9/17 terminated by ATP.   Admitted from clinic 06/10/16 with orthostasis and 24 lb weight loss after switch from Lasix to torsemide.  Optivol with fluid index well below threshold and impedance continuing up.  With orthostasis, light headedness, and living alone pt was admitted for dehydration. AKI noted on labwork. Diuretics held on admission and given IV fluid. Entresto stopped. Dizziness/orthostasis improved.  Torsemide resumed 06/13/16, then cut back further on 10/10 and again on 06/15/16 for uptrend in BUN.  Repeat Echo (10/17) showed EF 35-40%, mildly dilated RV with mild to moderately decreased systolic function, severe TR, PASP 48 mmHg.   Echo in 1/19 showed  EF 35% with wall motion abnormalities, mildly decreased RV systolic function, moderate TR.   He returns for followup of CHF. Weight is stable.  He walks with a cane or walker for balance.  No significant exertional dyspnea walking on flat ground.  No chest pain.  No orthopnea/PND.  No lightheadedness.    Labs (9/17): creatinine 1.13, K 3.4, hgb 9.4, BNP 2230 => 2270 Labs (10/17): K 4.1, creatinine 1.56  Labs (11/17): K 3.9, creatinine 1.36, BNP 1183 Labs (3/18): K 4.3, creatinine 1.95 Labs (12/18): K 4.1, creatinine 2.16 Labs (1/19): LDL 66, HDL 47 Labs (4/19): K 4.1, creatinine 1.75 Labs (6/19): K 4.5, creatinine 2.32 Labs (7/19): K 4.7, creatinine 1.9  PMH: 1. Atrial flutter: Paroxysmal. 2. PVCs, h/o VT.  3. Chronic systolic CHF: Ischemic cardiomyopathy.  Medtronic CRT-D.   - Echo (11/15): EF 20-25%, mildly dilated LV.   - Echo (10/17) showed EF 35-40%, mildly dilated RV with mild to moderately decreased systolic function, severe TR, PASP 48  mmHg. - Echo (1/19) with EF 35%, regional WMAs, mildly decreased RV systolic function, moderate TR.  4. H/o SBO 5. HTN 6. Gout  7. Hyperlipidemia 8. GERD 9. CAD: s/p CABG in 5/12 with LIMA-LAD, SVG-ramus, SVG-D1, SVG-PDA. - PCI to OM1 and proximal LCx in 8/12.  10. CKD: Stage 3.  38. BPH  SH: Nonsmoker, lives in apartment, no ETOH.    Family History  Problem Relation Age of Onset  . Heart Problems Mother   . Diabetes Mother   . Heart Problems Brother    ROS: All systems reviewed and negative except as per HPI.   Current Outpatient Medications  Medication Sig Dispense Refill  . acetaminophen (TYLENOL) 500 MG tablet Take 500 mg by mouth 2 (two) times daily as needed for mild pain.    Marland Kitchen acidophilus (RISAQUAD) CAPS capsule Take 1 capsule by mouth daily.    Marland Kitchen allopurinol (ZYLOPRIM) 300 MG tablet Take 300 mg by mouth daily.      Marland Kitchen atorvastatin (LIPITOR) 40 MG tablet Take 1 tablet (40 mg total) by mouth daily at 6 PM. 30 tablet 6  . carvedilol (COREG) 6.25 MG tablet Take 1.5 tablets (9.375 mg total) by mouth 2 (two) times daily with a meal. 90 tablet 11  . escitalopram (LEXAPRO) 10 MG tablet Take 1 tablet (10 mg total) by mouth daily.  0  . esomeprazole (NEXIUM) 20 MG capsule Take 20 mg by mouth every morning.     . famotidine (PEPCID) 20 MG tablet Take 20 mg by mouth daily.     Marland Kitchen  finasteride (PROSCAR) 5 MG tablet Take 5 mg by mouth daily.    Marland Kitchen ketotifen (ZADITOR) 0.025 % ophthalmic solution Place 1 drop into both eyes daily as needed (dry eyes).     . LORazepam (ATIVAN) 0.5 MG tablet Take 0.5 mg by mouth 2 (two) times daily.     Marland Kitchen NITROSTAT 0.4 MG SL tablet Place 0.4 mg under the tongue every 5 (five) minutes as needed for chest pain (MAX 3 TABLETS).     Marland Kitchen oxyCODONE (OXY IR/ROXICODONE) 5 MG immediate release tablet Take 1 tablet (5 mg total) by mouth every 6 (six) hours as needed for moderate pain. 30 tablet 0  . PATADAY 0.2 % SOLN Place 1 drop into both eyes daily.     Marland Kitchen RA ASPIRIN  ADULT LOW STRENGTH 81 MG EC tablet Take 1 tablet by mouth daily.  0  . tiZANidine (ZANAFLEX) 2 MG tablet Take 1 tablet (2 mg total) by mouth every 8 (eight) hours as needed for muscle spasms. 30 tablet 0  . torsemide (DEMADEX) 20 MG tablet Take 2 tablets (40 mg total) every other day alternating with 1 tablet (20 mg total) every other day. 135 tablet 3  . spironolactone (ALDACTONE) 25 MG tablet Take 0.5 tablets (12.5 mg total) by mouth daily. 45 tablet 3   No current facility-administered medications for this encounter.    BP 134/78   Pulse 65   Wt 70.5 kg (155 lb 6.4 oz)   SpO2 97%   BMI 26.67 kg/m    Wt Readings from Last 3 Encounters:  05/29/18 70.5 kg (155 lb 6.4 oz)  03/27/18 70.2 kg (154 lb 12.8 oz)  01/17/18 70.3 kg (155 lb)   General: NAD Neck: No JVD, no thyromegaly or thyroid nodule.  Lungs: Clear to auscultation bilaterally with normal respiratory effort. CV: Nondisplaced PMI.  Heart regular S1/S2, no S3/S4, no murmur.  No peripheral edema.  No carotid bruit.  Normal pedal pulses.  Abdomen: Soft, nontender, no hepatosplenomegaly, no distention.  Skin: Intact without lesions or rashes.  Neurologic: Alert and oriented x 3.  Psych: Normal affect. Extremities: No clubbing or cyanosis.  HEENT: Normal.   Assessment/Plan: 1. Chronic systolic CHF: Ischemic cardiomyopathy.  He has a Medtronic CRT-D device.  Echo in 1/19 showed EF 35% which is stable.  NYHA II symptoms.  Euvolemic by exam.  Off lisinopril with elevated creatinine. - Continue torsemide 20 mg daily alternating with 40 mg daily. BMET today.  - He has failed Entresto with marked hypotension.  - Off lisinopril with rise in creatinine.  - can add back spironolactone 12.5 mg daily. BMET 10 days.  - Continue Coreg 9.375 mg bid.   2. CAD: s/p CABG.  No chest pain.  - Continue statin and ASA 81 daily.   3. VT: ATP 9/17 was successful.  No VT recently.   4. Tricuspid regurgitation: Moderate on 1/19 echo.   5. CKD III:  BMET today.    Followup in 3 months.  Loralie Champagne 05/30/2018

## 2018-06-05 LAB — CUP PACEART REMOTE DEVICE CHECK
Battery Remaining Longevity: 22 mo
Battery Voltage: 2.92 V
Brady Statistic AP VP Percent: 76.15 %
Brady Statistic AS VS Percent: 0.59 %
Brady Statistic RA Percent Paced: 72.46 %
Brady Statistic RV Percent Paced: 94.2 %
Date Time Interrogation Session: 20190905084223
HIGH POWER IMPEDANCE MEASURED VALUE: 43 Ohm
HighPow Impedance: 35 Ohm
Implantable Lead Implant Date: 20040709
Implantable Lead Implant Date: 20041111
Implantable Lead Implant Date: 20130206
Implantable Lead Location: 753860
Implantable Lead Model: 5071
Implantable Lead Model: 5076
Implantable Lead Model: 5076
Implantable Lead Model: 6947
Implantable Pulse Generator Implant Date: 20150520
Lead Channel Impedance Value: 285 Ohm
Lead Channel Impedance Value: 399 Ohm
Lead Channel Impedance Value: 456 Ohm
Lead Channel Pacing Threshold Amplitude: 0.75 V
Lead Channel Pacing Threshold Pulse Width: 0.4 ms
Lead Channel Pacing Threshold Pulse Width: 0.8 ms
Lead Channel Sensing Intrinsic Amplitude: 2.875 mV
Lead Channel Sensing Intrinsic Amplitude: 2.875 mV
Lead Channel Sensing Intrinsic Amplitude: 2.875 mV
Lead Channel Setting Pacing Amplitude: 1.5 V
Lead Channel Setting Pacing Amplitude: 2.5 V
Lead Channel Setting Pacing Pulse Width: 0.8 ms
MDC IDC LEAD IMPLANT DT: 20150520
MDC IDC LEAD LOCATION: 753858
MDC IDC LEAD LOCATION: 753859
MDC IDC LEAD LOCATION: 753860
MDC IDC MSMT LEADCHNL LV IMPEDANCE VALUE: 4047 Ohm
MDC IDC MSMT LEADCHNL LV IMPEDANCE VALUE: 4047 Ohm
MDC IDC MSMT LEADCHNL LV PACING THRESHOLD AMPLITUDE: 1.5 V
MDC IDC MSMT LEADCHNL RA PACING THRESHOLD AMPLITUDE: 0.625 V
MDC IDC MSMT LEADCHNL RA SENSING INTR AMPL: 2.875 mV
MDC IDC MSMT LEADCHNL RV IMPEDANCE VALUE: 304 Ohm
MDC IDC MSMT LEADCHNL RV PACING THRESHOLD PULSEWIDTH: 0.4 ms
MDC IDC SET LEADCHNL RV PACING AMPLITUDE: 2 V
MDC IDC SET LEADCHNL RV PACING PULSEWIDTH: 0.4 ms
MDC IDC SET LEADCHNL RV SENSING SENSITIVITY: 0.3 mV
MDC IDC STAT BRADY AP VS PERCENT: 0.11 %
MDC IDC STAT BRADY AS VP PERCENT: 23.16 %

## 2018-06-12 ENCOUNTER — Ambulatory Visit (HOSPITAL_COMMUNITY)
Admission: RE | Admit: 2018-06-12 | Discharge: 2018-06-12 | Disposition: A | Discharge status: Home / Self Care | Payer: Medicare Other | Source: Ambulatory Visit | Attending: Cardiology | Admitting: Cardiology

## 2018-06-12 DIAGNOSIS — I5022 Chronic systolic (congestive) heart failure: Secondary | ICD-10-CM | POA: Diagnosis present

## 2018-06-12 LAB — BASIC METABOLIC PANEL
Anion gap: 12 (ref 5–15)
BUN: 48 mg/dL — ABNORMAL HIGH (ref 8–23)
CO2: 24 mmol/L (ref 22–32)
Calcium: 9.3 mg/dL (ref 8.9–10.3)
Chloride: 98 mmol/L (ref 98–111)
Creatinine, Ser: 1.92 mg/dL — ABNORMAL HIGH (ref 0.61–1.24)
GFR calc Af Amer: 34 mL/min — ABNORMAL LOW (ref >60)
GFR calc non Af Amer: 29 mL/min — ABNORMAL LOW (ref >60)
Glucose, Bld: 153 mg/dL — ABNORMAL HIGH (ref 70–99)
Potassium: 4 mmol/L (ref 3.5–5.1)
Sodium: 134 mmol/L — ABNORMAL LOW (ref 135–145)

## 2018-06-21 ENCOUNTER — Ambulatory Visit (INDEPENDENT_AMBULATORY_CARE_PROVIDER_SITE_OTHER): Payer: Medicare Other

## 2018-06-21 DIAGNOSIS — I5022 Chronic systolic (congestive) heart failure: Secondary | ICD-10-CM | POA: Diagnosis not present

## 2018-06-21 DIAGNOSIS — Z95810 Presence of automatic (implantable) cardiac defibrillator: Secondary | ICD-10-CM

## 2018-06-22 ENCOUNTER — Telehealth: Payer: Self-pay

## 2018-06-22 NOTE — Telephone Encounter (Signed)
Remote ICM transmission received.  Attempted call to friend [name] regarding ICM remote transmission and left detailed message, per DPR, with next ICM remote transmission date of 07/23/2018.  Advised to return call for any fluid symptoms or questions.

## 2018-06-22 NOTE — Progress Notes (Signed)
EPIC Encounter for ICM Monitoring  Patient Name: [name] is a 84 y.o. male Date: 06/22/2018 Primary Care Physican: [name], MD Primary Cardiologist:Harwani/McLean  Electrophysiologist: Druscilla Brownie Weight:[place] Bi-V Pacing: 97%       Attempted call to friend [name] and unable to reach.  Left detailed message, per DPR, regarding transmission.  Transmission reviewed.    Thoracic impedance normal.   Prescribed: Torsemide20 mg Take 2 tablets (40 mg total) every other day alternating with 1 tablet (20 mg total) every other day.  Labs: 06/12/2018 Creatinine 1.92, BUN 48, Potassium 4.0, Sodium 134, eGFR 29-34 05/29/2018 Creatinine 2.06, BUN 41, Potassium 3.8, Sodium 133, eGFR 27-31 03/27/2018 Creatinine1.90, BUN54, Potassium4.7, Sodium134, [id number] 02/09/2018 Creatinine2.32, BUN57, Potassium4.5, Sodium131, [id number]  01/31/2018 Creatinine2.03, BUN48, Potassium4.4, Sodium134, [id number]  01/17/2018 Creatinine1.64, BUN34, Potassium4.2, GBMBOM859, [id number] 12/25/2017 Creatinine 1.75, BUN 28, Potassium 4.1, Sodium 135, EGFR 33-38 09/21/2017 Creatinine 2.15, BUN 40, Potassium 4.6, Sodium 138, EGFR 26-30  Recommendations: Left voice mail with ICM number and encouraged to call if experiencing any fluid symptoms.  Follow-up plan: ICM clinic phone appointment on 07/23/2018.   Office appointment scheduled 08/21/2018 with Dr. Aundra Dubin.    Copy of ICM check sent to Dr. [name].   3 month ICM trend: 06/21/2018    1 Year ICM trend:       [name], RN 06/22/2018 11:33 AM

## 2018-06-25 ENCOUNTER — Emergency Department (HOSPITAL_COMMUNITY): Payer: Medicare Other

## 2018-06-25 ENCOUNTER — Encounter (HOSPITAL_COMMUNITY): Payer: Self-pay | Admitting: Emergency Medicine

## 2018-06-25 ENCOUNTER — Other Ambulatory Visit: Payer: Self-pay

## 2018-06-25 ENCOUNTER — Inpatient Hospital Stay (HOSPITAL_COMMUNITY)
Admission: EM | Admit: 2018-06-25 | Discharge: 2018-07-04 | DRG: 330 | Disposition: A | Discharge status: Skilled Nursing Facility | Payer: Medicare Other | Attending: Surgery | Admitting: Surgery

## 2018-06-25 DIAGNOSIS — Z452 Encounter for adjustment and management of vascular access device: Secondary | ICD-10-CM

## 2018-06-25 DIAGNOSIS — R0602 Shortness of breath: Secondary | ICD-10-CM

## 2018-06-25 DIAGNOSIS — Y92239 Unspecified place in hospital as the place of occurrence of the external cause: Secondary | ICD-10-CM | POA: Diagnosis not present

## 2018-06-25 DIAGNOSIS — R17 Unspecified jaundice: Secondary | ICD-10-CM | POA: Diagnosis not present

## 2018-06-25 DIAGNOSIS — K920 Hematemesis: Secondary | ICD-10-CM | POA: Diagnosis not present

## 2018-06-25 DIAGNOSIS — E878 Other disorders of electrolyte and fluid balance, not elsewhere classified: Secondary | ICD-10-CM | POA: Diagnosis present

## 2018-06-25 DIAGNOSIS — Z951 Presence of aortocoronary bypass graft: Secondary | ICD-10-CM

## 2018-06-25 DIAGNOSIS — I5022 Chronic systolic (congestive) heart failure: Secondary | ICD-10-CM | POA: Diagnosis present

## 2018-06-25 DIAGNOSIS — D696 Thrombocytopenia, unspecified: Secondary | ICD-10-CM | POA: Diagnosis not present

## 2018-06-25 DIAGNOSIS — E872 Acidosis: Secondary | ICD-10-CM | POA: Diagnosis present

## 2018-06-25 DIAGNOSIS — Y848 Other medical procedures as the cause of abnormal reaction of the patient, or of later complication, without mention of misadventure at the time of the procedure: Secondary | ICD-10-CM | POA: Diagnosis present

## 2018-06-25 DIAGNOSIS — E440 Moderate protein-calorie malnutrition: Secondary | ICD-10-CM | POA: Diagnosis present

## 2018-06-25 DIAGNOSIS — K9161 Intraoperative hemorrhage and hematoma of a digestive system organ or structure complicating a digestive sytem procedure: Secondary | ICD-10-CM | POA: Diagnosis not present

## 2018-06-25 DIAGNOSIS — E1122 Type 2 diabetes mellitus with diabetic chronic kidney disease: Secondary | ICD-10-CM | POA: Diagnosis present

## 2018-06-25 DIAGNOSIS — Z95810 Presence of automatic (implantable) cardiac defibrillator: Secondary | ICD-10-CM | POA: Diagnosis not present

## 2018-06-25 DIAGNOSIS — K208 Other esophagitis: Secondary | ICD-10-CM | POA: Diagnosis not present

## 2018-06-25 DIAGNOSIS — M109 Gout, unspecified: Secondary | ICD-10-CM | POA: Diagnosis present

## 2018-06-25 DIAGNOSIS — Z833 Family history of diabetes mellitus: Secondary | ICD-10-CM

## 2018-06-25 DIAGNOSIS — K4090 Unilateral inguinal hernia, without obstruction or gangrene, not specified as recurrent: Secondary | ICD-10-CM | POA: Diagnosis present

## 2018-06-25 DIAGNOSIS — K209 Esophagitis, unspecified without bleeding: Secondary | ICD-10-CM

## 2018-06-25 DIAGNOSIS — I255 Ischemic cardiomyopathy: Secondary | ICD-10-CM | POA: Diagnosis not present

## 2018-06-25 DIAGNOSIS — Z8711 Personal history of peptic ulcer disease: Secondary | ICD-10-CM

## 2018-06-25 DIAGNOSIS — I2510 Atherosclerotic heart disease of native coronary artery without angina pectoris: Secondary | ICD-10-CM | POA: Diagnosis present

## 2018-06-25 DIAGNOSIS — I472 Ventricular tachycardia: Secondary | ICD-10-CM | POA: Diagnosis present

## 2018-06-25 DIAGNOSIS — K432 Incisional hernia without obstruction or gangrene: Secondary | ICD-10-CM | POA: Diagnosis present

## 2018-06-25 DIAGNOSIS — D638 Anemia in other chronic diseases classified elsewhere: Secondary | ICD-10-CM | POA: Diagnosis present

## 2018-06-25 DIAGNOSIS — Z01810 Encounter for preprocedural cardiovascular examination: Secondary | ICD-10-CM | POA: Diagnosis not present

## 2018-06-25 DIAGNOSIS — D631 Anemia in chronic kidney disease: Secondary | ICD-10-CM | POA: Diagnosis present

## 2018-06-25 DIAGNOSIS — K630 Abscess of intestine: Secondary | ICD-10-CM | POA: Diagnosis present

## 2018-06-25 DIAGNOSIS — K2970 Gastritis, unspecified, without bleeding: Secondary | ICD-10-CM

## 2018-06-25 DIAGNOSIS — Z4659 Encounter for fitting and adjustment of other gastrointestinal appliance and device: Secondary | ICD-10-CM

## 2018-06-25 DIAGNOSIS — E876 Hypokalemia: Secondary | ICD-10-CM | POA: Diagnosis present

## 2018-06-25 DIAGNOSIS — I4891 Unspecified atrial fibrillation: Secondary | ICD-10-CM | POA: Diagnosis present

## 2018-06-25 DIAGNOSIS — K228 Other specified diseases of esophagus: Secondary | ICD-10-CM | POA: Diagnosis present

## 2018-06-25 DIAGNOSIS — I10 Essential (primary) hypertension: Secondary | ICD-10-CM | POA: Diagnosis present

## 2018-06-25 DIAGNOSIS — K219 Gastro-esophageal reflux disease without esophagitis: Secondary | ICD-10-CM | POA: Diagnosis present

## 2018-06-25 DIAGNOSIS — E785 Hyperlipidemia, unspecified: Secondary | ICD-10-CM | POA: Diagnosis present

## 2018-06-25 DIAGNOSIS — K2900 Acute gastritis without bleeding: Secondary | ICD-10-CM | POA: Diagnosis present

## 2018-06-25 DIAGNOSIS — K559 Vascular disorder of intestine, unspecified: Secondary | ICD-10-CM | POA: Diagnosis present

## 2018-06-25 DIAGNOSIS — K631 Perforation of intestine (nontraumatic): Secondary | ICD-10-CM | POA: Diagnosis present

## 2018-06-25 DIAGNOSIS — I493 Ventricular premature depolarization: Secondary | ICD-10-CM | POA: Diagnosis present

## 2018-06-25 DIAGNOSIS — K567 Ileus, unspecified: Secondary | ICD-10-CM | POA: Diagnosis not present

## 2018-06-25 DIAGNOSIS — K210 Gastro-esophageal reflux disease with esophagitis: Secondary | ICD-10-CM | POA: Diagnosis not present

## 2018-06-25 DIAGNOSIS — Z87891 Personal history of nicotine dependence: Secondary | ICD-10-CM

## 2018-06-25 DIAGNOSIS — E1159 Type 2 diabetes mellitus with other circulatory complications: Secondary | ICD-10-CM | POA: Diagnosis present

## 2018-06-25 DIAGNOSIS — I130 Hypertensive heart and chronic kidney disease with heart failure and stage 1 through stage 4 chronic kidney disease, or unspecified chronic kidney disease: Secondary | ICD-10-CM | POA: Diagnosis present

## 2018-06-25 DIAGNOSIS — K660 Peritoneal adhesions (postprocedural) (postinfection): Secondary | ICD-10-CM | POA: Diagnosis present

## 2018-06-25 DIAGNOSIS — N183 Chronic kidney disease, stage 3 unspecified: Secondary | ICD-10-CM

## 2018-06-25 DIAGNOSIS — I480 Paroxysmal atrial fibrillation: Secondary | ICD-10-CM | POA: Diagnosis not present

## 2018-06-25 DIAGNOSIS — D62 Acute posthemorrhagic anemia: Secondary | ICD-10-CM | POA: Diagnosis not present

## 2018-06-25 DIAGNOSIS — Z955 Presence of coronary angioplasty implant and graft: Secondary | ICD-10-CM

## 2018-06-25 LAB — COMPREHENSIVE METABOLIC PANEL
ALBUMIN: 4.1 g/dL (ref 3.5–5.0)
ALT: 11 U/L (ref 0–44)
AST: 19 U/L (ref 15–41)
Alkaline Phosphatase: 96 U/L (ref 38–126)
Anion gap: 12 (ref 5–15)
BILIRUBIN TOTAL: 1.2 mg/dL (ref 0.3–1.2)
BUN: 66 mg/dL — AB (ref 8–23)
CO2: 25 mmol/L (ref 22–32)
CREATININE: 2.23 mg/dL — AB (ref 0.61–1.24)
Calcium: 9.4 mg/dL (ref 8.9–10.3)
Chloride: 96 mmol/L — ABNORMAL LOW (ref 98–111)
GFR calc Af Amer: 28 mL/min — ABNORMAL LOW (ref >60)
GFR, EST NON AFRICAN AMERICAN: 24 mL/min — AB (ref >60)
GLUCOSE: 200 mg/dL — AB (ref 70–99)
POTASSIUM: 4.3 mmol/L (ref 3.5–5.1)
Sodium: 133 mmol/L — ABNORMAL LOW (ref 135–145)
TOTAL PROTEIN: 7.6 g/dL (ref 6.5–8.1)

## 2018-06-25 LAB — CBC
HCT: 36.3 % — ABNORMAL LOW (ref 39.0–52.0)
HEMOGLOBIN: 12 g/dL — AB (ref 13.0–17.0)
MCH: 31.1 pg (ref 26.0–34.0)
MCHC: 33.1 g/dL (ref 30.0–36.0)
MCV: 94 fL (ref 80.0–100.0)
NRBC: 0 % (ref 0.0–0.2)
Platelets: 147 10*3/uL — ABNORMAL LOW (ref 150–400)
RBC: 3.86 MIL/uL — ABNORMAL LOW (ref 4.22–5.81)
RDW: 14.9 % (ref 11.5–15.5)
WBC: 9 10*3/uL (ref 4.0–10.5)

## 2018-06-25 LAB — URINALYSIS, ROUTINE W REFLEX MICROSCOPIC
BILIRUBIN URINE: NEGATIVE
GLUCOSE, UA: NEGATIVE mg/dL
HGB URINE DIPSTICK: NEGATIVE
KETONES UR: NEGATIVE mg/dL
Leukocytes, UA: NEGATIVE
Nitrite: NEGATIVE
PROTEIN: NEGATIVE mg/dL
Specific Gravity, Urine: 1.008 (ref 1.005–1.030)
pH: 5 (ref 5.0–8.0)

## 2018-06-25 MED ORDER — METRONIDAZOLE IN NACL 5-0.79 MG/ML-% IV SOLN
500.0000 mg | Freq: Three times a day (TID) | INTRAVENOUS | Status: DC
  Filled 2018-06-25: qty 100

## 2018-06-25 MED ORDER — HYDROMORPHONE HCL 1 MG/ML IJ SOLN: 1.0000 mg | INTRAMUSCULAR | Status: DC | PRN

## 2018-06-25 MED ORDER — ACETAMINOPHEN 650 MG RE SUPP: 650.0000 mg | Freq: Four times a day (QID) | RECTAL | Status: DC | PRN

## 2018-06-25 MED ORDER — PIPERACILLIN-TAZOBACTAM 3.375 G IVPB 30 MIN
3.3750 g | Freq: Once | INTRAVENOUS | Status: AC
  Administered 2018-06-25: 3.375 g via INTRAVENOUS
  Filled 2018-06-25: qty 50

## 2018-06-25 MED ORDER — SODIUM CHLORIDE 0.9 % IV BOLUS
500.0000 mL | Freq: Once | INTRAVENOUS | Status: AC
  Administered 2018-06-25: 500 mL via INTRAVENOUS

## 2018-06-25 MED ORDER — SODIUM CHLORIDE 0.9 % IV SOLN
INTRAVENOUS | Status: DC | PRN
  Administered 2018-06-25 – 2018-06-28 (×4): 500 mL via INTRAVENOUS
  Administered 2018-06-28 (×3): 250 mL via INTRAVENOUS
  Administered 2018-07-02: 50 mL via INTRAVENOUS
  Administered 2018-07-02: 100 mL via INTRAVENOUS

## 2018-06-25 MED ORDER — ONDANSETRON HCL 4 MG/2ML IJ SOLN
4.0000 mg | Freq: Once | INTRAMUSCULAR | Status: AC
  Administered 2018-06-25: 4 mg via INTRAVENOUS
  Filled 2018-06-25: qty 2

## 2018-06-25 MED ORDER — MORPHINE SULFATE (PF) 2 MG/ML IV SOLN
2.0000 mg | Freq: Once | INTRAVENOUS | Status: AC
  Administered 2018-06-25: 2 mg via INTRAVENOUS
  Filled 2018-06-25: qty 1

## 2018-06-25 MED ORDER — ONDANSETRON HCL 4 MG/2ML IJ SOLN: 4.0000 mg | Freq: Four times a day (QID) | INTRAMUSCULAR | Status: DC | PRN

## 2018-06-25 MED ORDER — ONDANSETRON 4 MG PO TBDP: 4.0000 mg | ORAL_TABLET | Freq: Four times a day (QID) | ORAL | Status: DC | PRN

## 2018-06-25 MED ORDER — ACETAMINOPHEN 325 MG PO TABS: 650.0000 mg | ORAL_TABLET | Freq: Four times a day (QID) | ORAL | Status: DC | PRN

## 2018-06-25 MED ORDER — SODIUM CHLORIDE 0.9 % IV SOLN
2.0000 g | Freq: Once | INTRAVENOUS | Status: DC
  Filled 2018-06-25: qty 2

## 2018-06-25 NOTE — ED Triage Notes (Signed)
Patient complains of right abdominal pain he rated an 8/10 and describes as achy starting around 1700 today. He reports this pain is similar to when he had a ruptured bowel.

## 2018-06-25 NOTE — ED Notes (Signed)
Provided patient ice chips.

## 2018-06-25 NOTE — H&P (Signed)
[name] is an 84 y.o. male.    General Surgery [facility]  Chief Complaint: abdominal pain, ventral incisional hernia, intestinal ischemia, possible perforation  HPI: patient is an 84 yo BM with history of intestinal perforation requiring laparotomy over 10 years ago by Dr. Elesa Hacker,  Complex procedure and hospital stay.  This afternoon patient experienced onset of right sided abdominal pain very similar to previous episode.  He presented to ER for evaluation.  WBC normal at 9.0.  CTA demonstrates right sided abdominal wall hernia containing small intestine which appears ischemic with small contained perforation.  Surgery called for management.  Patient has extensive cardiac hx with AICD/pacemaker followed by Dr. Aundra Dubin and Dr. [name].  On ASA only.  Hx of cholecystectomy.  Past Medical History:  Diagnosis Date  . AICD (automatic cardioverter/defibrillator) present   . Arthritis    "shoulders" (10/31/2014)  . CAD (coronary artery disease)    s/p CABG; s/p Pacemaker  . Diverticulosis of colon   . GERD (gastroesophageal reflux disease)   . Gout   . Hyperlipidemia   . Hypertension   . Ischemic cardiomyopathy    s/p ICD  . On home oxygen therapy    "2L prn" (10/31/2014)  . Paroxysmal ventricular tachycardia ([place])   . SBO (small bowel obstruction) ([place]) 10/31/2014  . Shortness of breath dyspnea    with exertion  . Systolic CHF with reduced left ventricular function, NYHA class 2 ([place])     Past Surgical History:  Procedure Laterality Date  . BI-VENTRICULAR IMPLANTABLE CARDIOVERTER DEFIBRILLATOR UPGRADE N/A 01/22/2014   Procedure: BI-VENTRICULAR IMPLANTABLE CARDIOVERTER DEFIBRILLATOR UPGRADE;  Surgeon: Evans Lance, MD;  Location: [facility] CATH LAB;  Service: Cardiovascular;  Laterality: N/A;  . BOWEL RESECTION    . CARDIAC CATHETERIZATION  03/2011   Archie Endo 01/18/2011  . CARDIAC DEFIBRILLATOR PLACEMENT  07/2003   Archie Endo 01/18/2011  . CATARACT EXTRACTION W/  INTRAOCULAR LENS  IMPLANT, BILATERAL Bilateral   . CATARACT EXTRACTION W/PHACO Right 12/17/2014   Procedure: PHACOEMULSIFICATION CATARACT EXTRACTION WITH IOL IMPLANT RIGHT EYE;  Surgeon: Marylynn Pearson, MD;  Location: [place];  Service: Ophthalmology;  Laterality: Right;  . CHOLECYSTECTOMY    . CORONARY ANGIOPLASTY WITH STENT PLACEMENT  04/2011   2 stents/notes 05/03/2011  . CORONARY ARTERY BYPASS GRAFT  03/2003   CABG X3/notes 01/18/2011  . EYE SURGERY Bilateral    [place]  . IMPLANTABLE CARDIOVERTER DEFIBRILLATOR (ICD) GENERATOR CHANGE Left 10/12/2011   Procedure: ICD GENERATOR CHANGE;  Surgeon: Evans Lance, MD;  Location[facility] CATH LAB;  Service: Cardiovascular;  Laterality: Left;  . PACEMAKER PLACEMENT    . PACEMAKER REVISION N/A 10/12/2011   Procedure: PACEMAKER REVISION;  Surgeon: Evans Lance, MD;  Location: [facility] CATH LAB;  Service: Cardiovascular;  Laterality: N/A;  . TEE WITH CARDIOVERSION  03/2003   Archie Endo 01/18/2011  . TONSILLECTOMY      Family History  Problem Relation Age of Onset  . Heart Problems Mother   . Diabetes Mother   . Heart Problems Brother    Social History:  reports that he has never smoked. He has quit using smokeless tobacco.  His smokeless tobacco use included chew. He reports that he drinks alcohol. He reports that he does not use drugs.  Allergies: No Known Allergies   (Not in a hospital admission)  Results for orders placed or performed during the hospital encounter of 06/25/18 (from the past 48 hour(s))  CBC     Status: Abnormal  Collection Time: 06/25/18  9:17 PM  Result Value Ref Range   WBC 9.0 4.0 - 10.5 K/uL   RBC 3.86 (L) 4.22 - 5.81 MIL/uL   Hemoglobin 12.0 (L) 13.0 - 17.0 g/dL   HCT 36.3 (L) 39.0 - 52.0 %   MCV 94.0 80.0 - 100.0 fL   MCH 31.1 26.0 - 34.0 pg   MCHC 33.1 30.0 - 36.0 g/dL   RDW 14.9 11.5 - 15.5 %   Platelets 147 (L) 150 - 400 K/uL   nRBC 0.0 0.0 - 0.2 %    Comment: Performed at [facility], [place] [address] [place], [place] [address]  Comprehensive metabolic panel     Status: Abnormal   Collection Time: 06/25/18  9:17 PM  Result Value Ref Range   Sodium 133 (L) 135 - 145 mmol/L   Potassium 4.3 3.5 - 5.1 mmol/L   Chloride 96 (L) 98 - 111 mmol/L   CO2 25 22 - 32 mmol/L   Glucose, Bld 200 (H) 70 - 99 mg/dL   BUN 66 (H) 8 - 23 mg/dL   Creatinine, Ser 2.23 (H) 0.61 - 1.24 mg/dL   Calcium 9.4 8.9 - 10.3 mg/dL   Total Protein 7.6 6.5 - 8.1 g/dL   Albumin 4.1 3.5 - 5.0 g/dL   AST 19 15 - 41 U/L   ALT 11 0 - 44 U/L   Alkaline Phosphatase 96 38 - 126 U/L   Total Bilirubin 1.2 0.3 - 1.2 mg/dL   GFR calc non Af Amer 24 (L) >60 mL/min   GFR calc Af Amer 28 (L) >60 mL/min    Comment: (NOTE) The eGFR has been calculated using the CKD EPI equation. This calculation has not been validated in all clinical situations. eGFR's persistently <60 mL/min signify possible Chronic Kidney Disease.    Anion gap 12 5 - 15    Comment: Performed at [facility], [place] [address] [place], [place] [address]  Urinalysis, Routine w reflex microscopic     Status: Abnormal   Collection Time: 06/25/18  9:17 PM  Result Value Ref Range   Color, Urine STRAW (A) YELLOW   APPearance CLEAR CLEAR   Specific Gravity, Urine 1.008 1.005 - 1.030   pH 5.0 5.0 - 8.0   Glucose, UA NEGATIVE NEGATIVE mg/dL   Hgb urine dipstick NEGATIVE NEGATIVE   Bilirubin Urine NEGATIVE NEGATIVE   Ketones, ur NEGATIVE NEGATIVE mg/dL   Protein, ur NEGATIVE NEGATIVE mg/dL   Nitrite NEGATIVE NEGATIVE   Leukocytes, UA NEGATIVE NEGATIVE    Comment: Performed at [place] [address], [place], [place] [address]   Ct Abdomen Pelvis Wo Contrast  Result Date: 06/25/2018 CLINICAL DATA:  Acute abdominal pain EXAM: CT ABDOMEN AND PELVIS WITHOUT CONTRAST TECHNIQUE: Multidetector CT imaging of the abdomen and pelvis was performed following the standard protocol without IV contrast. COMPARISON:  CT abdomen pelvis  10/31/2014 FINDINGS: LOWER CHEST: Moderate cardiomegaly.  No pleural effusion. HEPATOBILIARY: The hepatic contours and density are normal. There is no intra- or extrahepatic biliary dilatation. The gallbladder is normal. There is a 5 x 5 mm stone within the distal common bile duct. PANCREAS: The pancreatic parenchymal contours are normal and there is no ductal dilatation. There is no peripancreatic fluid collection. SPLEEN: Normal. ADRENALS/URINARY TRACT: --Adrenal glands: Normal. --Right kidney/ureter: No hydronephrosis, nephroureterolithiasis, perinephric stranding or solid renal mass. --Left kidney/ureter: No hydronephrosis, nephroureterolithiasis, perinephric stranding or solid renal mass. --Urinary bladder: Normal for degree of  distention STOMACH/BOWEL: --Stomach/Duodenum: There is no hiatal hernia or other gastric abnormality. The duodenal course and caliber are normal. --Small bowel: There is a right lower quadrant ventral abdominal hernia that contains portions of small bowel, including a segment with an anastomotic junction and fecalized contents. There is extraluminal gas surrounding the segment, some of which is probably intramural (pneumatosis) and some of which appears to be intraperitoneal. There is moderate surrounding inflammatory change. --Colon: No focal abnormality. --Appendix: Normal. VASCULAR/LYMPHATIC: Atherosclerotic calcification is present within the non-aneurysmal abdominal aorta, without hemodynamically significant stenosis. No abdominal or pelvic lymphadenopathy. REPRODUCTIVE: Enlarged prostate measures 5 cm in transverse dimension. MUSCULOSKELETAL. Grade 1 anterolisthesis at L4-5 due to facet arthrosis. Large Schmorl's node at superior endplate of X90. OTHER: Right inguinal hernia contains a loop of nondilated small bowel. IMPRESSION: 1. Right ventral abdominal hernia containing loops of small bowel, with suspected pneumatosis and likely perforation at the site of prior anastomosis. This  likely indicates a short segment of ischemic bowel. 2. 5 x 5 mm stone in the distal common bile duct without biliary dilatation. 3. Right inguinal hernia containing loop of nondilated small bowel. 4.  Aortic Atherosclerosis (ICD10-I70.0). Critical Value/emergent results were called by telephone at the time of interpretation on 06/25/2018 at 110:56 PM to Dr. Lajean Saver , who verbally acknowledged these results. Electronically Signed   By: Ulyses Jarred M.D.   On: 06/25/2018 23:06    Review of Systems  Constitutional: Negative for chills, diaphoresis and fever.  HENT: Negative.   Eyes: Negative.   Respiratory: Negative.   Cardiovascular: Positive for chest pain.  Gastrointestinal: Positive for abdominal pain and nausea. Negative for vomiting.  Genitourinary: Negative.   Musculoskeletal: Negative.   Skin: Negative.   Neurological: Negative.   Endo/Heme/Allergies: Negative.   Psychiatric/Behavioral: Negative.     Blood pressure (!) 112/48, pulse 60, height _0  (1.626 m), weight 68.9 kg, SpO2 100 %. Physical Exam  Constitutional: He is oriented to person, place, and time. He appears well-developed and well-nourished. No distress.  HENT:  Head: Normocephalic and atraumatic.  Right Ear: External ear normal.  Left Ear: External ear normal.  Mouth/Throat: No oropharyngeal exudate.  Eyes: Pupils are equal, round, and reactive to light. Conjunctivae are normal. No scleral icterus.  Neck: Normal range of motion. Neck supple. No tracheal deviation present. No thyromegaly present.  Cardiovascular: Normal rate and normal heart sounds.  Respiratory: Effort normal and breath sounds normal. No respiratory distress. He has no wheezes.  GI: Soft. He exhibits no distension. There is tenderness (RLQ). There is guarding (RLQ). There is no rebound.  Complex abd incisions in midline, upper abdomen, and RUQ.  Apparent retention sutures in past.  Genitourinary: Penis normal.  Musculoskeletal: Normal range  of motion. He exhibits no edema or deformity.  Lymphadenopathy:    He has no cervical adenopathy.  Neurological: He is alert and oriented to person, place, and time.  Skin: Skin is warm and dry. He is not diaphoretic.  Psychiatric: He has a normal mood and affect. His behavior is normal.     Assessment/Plan Ventral incisional hernia with ischemic small intestine, possible contained perforation  Admit to stepdown to surgical service  Begin IV abx's, NPO, begin prep for surgery  Medicine consult - will need cardiology assessment urgently  Will need rep to disable AICD for surgery  Patient is hemodynamically stable at present with good pain control.  Will admit to stepdown unit, begin fluids and IV abx's, and ask medical team to assess  and optimize for surgery.  Will need cardiology involvement and rep for AICD device to manage perioperatively.  Will discuss with Dr. Annye English who will make final determination for operative intervention.  Plan discussed with family and patient.  They agree to proceed.  Armandina Gemma, [facility] Office: [phone]     Earnstine Regal, MD 06/25/2018, 11:21 PM

## 2018-06-25 NOTE — ED Notes (Signed)
Bed: SP19 Expected date:  Expected time:  Means of arrival:  Comments: 84 yr old abd pain

## 2018-06-25 NOTE — ED Provider Notes (Addendum)
[place] DEPT Provider Note   CSN: [id number] Arrival date & time: 06/25/18  2010     History   Chief Complaint Chief Complaint  Patient presents with  . Abdominal Pain    HPI [name] is a 84 y.o. male.  Patient c/o acute onset right sided abdominal pain this AM. Pain dull, moderate, constant, non radiating. Denies nausea, vomiting or diarrhea. States had a normal bm today. Denies dysuria or hematuria. No back or flank pain. Denies chest pain. No sob. No fever or chills.   The history is provided by the patient.  Abdominal Pain   Pertinent negatives include fever, vomiting and headaches.    Past Medical History:  Diagnosis Date  . AICD (automatic cardioverter/defibrillator) present   . Arthritis    "shoulders" (10/31/2014)  . CAD (coronary artery disease)    s/p CABG; s/p Pacemaker  . Diverticulosis of colon   . GERD (gastroesophageal reflux disease)   . Gout   . Hyperlipidemia   . Hypertension   . Ischemic cardiomyopathy    s/p ICD  . On home oxygen therapy    "2L prn" (10/31/2014)  . Paroxysmal ventricular tachycardia ([place])   . SBO (small bowel obstruction) ([place]) 10/31/2014  . Shortness of breath dyspnea    with exertion  . Systolic CHF with reduced left ventricular function, NYHA class 2 [facility])     Patient Active Problem List   Diagnosis Date Noted  . Orthostatic hypotension 06/29/2015  . VT (ventricular tachycardia) ([place])   . C1 cervical fracture ([place]) 06/24/2015  . Anemia of chronic disease 06/24/2015  . Laceration 06/24/2015  . Intractable pain 06/24/2015  . A-fib ([place]) 06/24/2015  . Hypertension   . GERD (gastroesophageal reflux disease)   . Protein-calorie malnutrition, severe ([place]) 11/02/2014  . Partial small bowel obstruction ([place]) 10/31/2014  . DM type 2 causing vascular disease ([place]) 10/31/2014  . Do not resuscitate 10/31/2014  . Hospice care 10/31/2014  . Small bowel obstruction due to postoperative  adhesions 10/31/2014  . Cardiomyopathy, ischemic   . Atrial flutter ([place]) 09/02/2014  . Chronic systolic heart failure ([place]) 07/23/2009  . Automatic implantable cardioverter-defibrillator in situ 07/23/2009  . Coronary atherosclerosis 07/17/2009  . CORONARY ARTERY BYPASS GRAFT, HX OF 07/17/2009    Past Surgical History:  Procedure Laterality Date  . BI-VENTRICULAR IMPLANTABLE CARDIOVERTER DEFIBRILLATOR UPGRADE N/A 01/22/2014   Procedure: BI-VENTRICULAR IMPLANTABLE CARDIOVERTER DEFIBRILLATOR UPGRADE;  Surgeon: Evans Lance, MD;  Location: [facility] CATH LAB;  Service: Cardiovascular;  Laterality: N/A;  . BOWEL RESECTION    . CARDIAC CATHETERIZATION  03/2011   Archie Endo 01/18/2011  . CARDIAC DEFIBRILLATOR PLACEMENT  07/2003   Archie Endo 01/18/2011  . CATARACT EXTRACTION W/ INTRAOCULAR LENS  IMPLANT, BILATERAL Bilateral   . CATARACT EXTRACTION W/PHACO Right 12/17/2014   Procedure: PHACOEMULSIFICATION CATARACT EXTRACTION WITH IOL IMPLANT RIGHT EYE;  Surgeon: Marylynn Pearson, MD;  Location: [place];  Service: Ophthalmology;  Laterality: Right;  . CHOLECYSTECTOMY    . CORONARY ANGIOPLASTY WITH STENT PLACEMENT  04/2011   2 stents/notes 05/03/2011  . CORONARY ARTERY BYPASS GRAFT  03/2003   CABG X3/notes 01/18/2011  . EYE SURGERY Bilateral    [place]  . IMPLANTABLE CARDIOVERTER DEFIBRILLATOR (ICD) GENERATOR CHANGE Left 10/12/2011   Procedure: ICD GENERATOR CHANGE;  Surgeon: Evans Lance, MD;  Location: [facility] C[facility] LAB;  Service: Cardiovascular;  Laterality: Left;  . PACEMAKER PLACEMENT    . PACEMAKER REVISION N/A 10/12/2011   Procedure: PACEMAKER REVISION;  Surgeon: Carleene Overlie  Peyton Najjar, MD;  Location: [facility] CATH LAB;  Service: Cardiovascular;  Laterality: N/A;  . TEE WITH CARDIOVERSION  03/2003   Archie Endo 01/18/2011  . TONSILLECTOMY          Home Medications    Prior to Admission medications   Medication Sig Start Date End Date Taking? Authorizing Provider  allopurinol (ZYLOPRIM) 300 MG tablet Take 300 mg by mouth daily.      Yes [provider]  carvedilol (COREG) 6.25 MG tablet Take 1.5 tablets (9.375 mg total) by mouth 2 (two) times daily with a meal. 03/27/18  Yes Larey Dresser, MD  esomeprazole (NEXIUM) 20 MG capsule Take 20 mg by mouth every morning.    Yes [provider]  LORazepam (ATIVAN) 0.5 MG tablet Take 0.5 mg by mouth 2 (two) times daily as needed for anxiety.  08/22/16  Yes [provider]  RA ASPIRIN ADULT LOW STRENGTH 81 MG EC tablet Take 1 tablet by mouth daily. 09/01/14  Yes [provider]  spironolactone (ALDACTONE) 25 MG tablet Take 0.5 tablets (12.5 mg total) by mouth daily. 05/29/18  Yes Larey Dresser, MD  acetaminophen (TYLENOL) 500 MG tablet Take 500 mg by mouth 2 (two) times daily as needed for mild pain.    [provider]  acidophilus (RISAQUAD) CAPS capsule Take 1 capsule by mouth daily.    [provider]  atorvastatin (LIPITOR) 40 MG tablet Take 1 tablet (40 mg total) by mouth daily at 6 PM. 03/06/18   Larey Dresser, MD  escitalopram (LEXAPRO) 10 MG tablet Take 1 tablet (10 mg total) by mouth daily. 06/28/15   Ghimire, Henreitta Leber, MD  famotidine (PEPCID) 20 MG tablet Take 20 mg by mouth daily.     [provider]  ketotifen (ZADITOR) 0.025 % ophthalmic solution Place 1 drop into both eyes daily as needed (dry eyes).     [provider]  NITROSTAT 0.4 MG SL tablet Place 0.4 mg under the tongue every 5 (five) minutes as needed for chest pain (MAX 3 TABLETS).  12/05/12   [provider]  oxyCODONE (OXY IR/ROXICODONE) 5 MG immediate release tablet Take 1 tablet (5 mg total) by mouth every 6 (six) hours as needed for moderate pain. Patient not taking: Reported on 06/25/2018 06/28/15   Jonetta Osgood, MD  PATADAY 0.2 % SOLN Place 1 drop into both eyes daily.  01/14/14   [provider]  tiZANidine (ZANAFLEX) 2 MG tablet Take 1 tablet (2 mg total) by mouth every 8 (eight) hours as needed for muscle  spasms. Patient not taking: Reported on 06/25/2018 06/18/15   Gareth Morgan, MD  torsemide (DEMADEX) 20 MG tablet Take 2 tablets (40 mg total) every other day alternating with 1 tablet (20 mg total) every other day. 05/21/18   Larey Dresser, MD    Family History Family History  Problem Relation Age of Onset  . Heart Problems Mother   . Diabetes Mother   . Heart Problems Brother     Social History Social History   Tobacco Use  . Smoking status: Never Smoker  . Smokeless tobacco: Former Systems developer    Types: Chew  . Tobacco comment: no chew in over 3 years  Substance Use Topics  . Alcohol use: Yes    Comment: "might take a drink during the holidays"  . Drug use: No     Allergies   Patient has no known allergies.   Review of Systems Review of Systems  Constitutional: Negative for fever.  HENT: Negative for sore throat.   Eyes: Negative for redness.  Respiratory: Negative for shortness of breath.   Cardiovascular: Negative for chest pain.  Gastrointestinal: Positive for abdominal pain. Negative for vomiting.  Genitourinary: Negative for flank pain.  Musculoskeletal: Negative for back pain and neck pain.  Skin: Negative for rash.  Neurological: Negative for headaches.  Hematological: Does not bruise/bleed easily.  Psychiatric/Behavioral: Negative for confusion.     Physical Exam Updated Vital Signs BP (!) 114/55   Pulse 63   SpO2 98%   Physical Exam  Constitutional: He appears well-developed and well-nourished.  HENT:  Mouth/Throat: Oropharynx is clear and moist.  Eyes: Conjunctivae are normal.  Neck: Neck supple. No tracheal deviation present.  Cardiovascular: Normal rate, regular rhythm, normal heart sounds and intact distal pulses. Exam reveals no gallop and no friction rub.  No murmur heard. Pulmonary/Chest: Effort normal and breath sounds normal. No accessory muscle usage. No respiratory distress.  Abdominal: Soft. Bowel sounds are normal. He exhibits no  distension and no mass. There is tenderness. There is no rebound and no guarding.  Moderate right abd tenderness. Large soft reducible ventral hernia.   Genitourinary:  Genitourinary Comments: No cva tenderness.   Musculoskeletal: He exhibits no edema.  Neurological: He is alert.  Skin: Skin is warm and dry. No rash noted.  Psychiatric: He has a normal mood and affect.  Nursing note and vitals reviewed.    ED Treatments / Results  Labs (all labs ordered are listed, but only abnormal results are displayed) Results for orders placed or performed during the hospital encounter of 06/25/18  CBC  Result Value Ref Range   WBC 9.0 4.0 - 10.5 K/uL   RBC 3.86 (L) 4.22 - 5.81 MIL/uL   Hemoglobin 12.0 (L) 13.0 - 17.0 g/dL   HCT 36.3 (L) 39.0 - 52.0 %   MCV 94.0 80.0 - 100.0 fL   MCH 31.1 26.0 - 34.0 pg   MCHC 33.1 30.0 - 36.0 g/dL   RDW 14.9 11.5 - 15.5 %   Platelets 147 (L) 150 - 400 K/uL   nRBC 0.0 0.0 - 0.2 %  Comprehensive metabolic panel  Result Value Ref Range   Sodium 133 (L) 135 - 145 mmol/L   Potassium 4.3 3.5 - 5.1 mmol/L   Chloride 96 (L) 98 - 111 mmol/L   CO2 25 22 - 32 mmol/L   Glucose, Bld 200 (H) 70 - 99 mg/dL   BUN 66 (H) 8 - 23 mg/dL   Creatinine, Ser 2.23 (H) 0.61 - 1.24 mg/dL   Calcium 9.4 8.9 - 10.3 mg/dL   Total Protein 7.6 6.5 - 8.1 g/dL   Albumin 4.1 3.5 - 5.0 g/dL   AST 19 15 - 41 U/L   ALT 11 0 - 44 U/L   Alkaline Phosphatase 96 38 - 126 U/L   Total Bilirubin 1.2 0.3 - 1.2 mg/dL   GFR calc non Af Amer 24 (L) >60 mL/min   GFR calc Af Amer 28 (L) >60 mL/min   Anion gap 12 5 - 15  Urinalysis, Routine w reflex microscopic  Result Value Ref Range   Color, Urine STRAW (A) YELLOW   APPearance CLEAR CLEAR   Specific Gravity, Urine 1.008 1.005 - 1.030   pH 5.0 5.0 - 8.0   Glucose, UA NEGATIVE NEGATIVE mg/dL   Hgb urine dipstick NEGATIVE NEGATIVE   Bilirubin Urine NEGATIVE NEGATIVE   Ketones, ur NEGATIVE NEGATIVE mg/dL   Protein,  ur NEGATIVE NEGATIVE mg/dL     Nitrite NEGATIVE NEGATIVE   Leukocytes, UA NEGATIVE NEGATIVE   EKG None  Radiology Ct Abdomen Pelvis Wo Contrast  Result Date: 06/25/2018 CLINICAL DATA:  Acute abdominal pain EXAM: CT ABDOMEN AND PELVIS WITHOUT CONTRAST TECHNIQUE: Multidetector CT imaging of the abdomen and pelvis was performed following the standard protocol without IV contrast. COMPARISON:  CT abdomen pelvis 10/31/2014 FINDINGS: LOWER CHEST: Moderate cardiomegaly.  No pleural effusion. HEPATOBILIARY: The hepatic contours and density are normal. There is no intra- or extrahepatic biliary dilatation. The gallbladder is normal. There is a 5 x 5 mm stone within the distal common bile duct. PANCREAS: The pancreatic parenchymal contours are normal and there is no ductal dilatation. There is no peripancreatic fluid collection. SPLEEN: Normal. ADRENALS/URINARY TRACT: --Adrenal glands: Normal. --Right kidney/ureter: No hydronephrosis, nephroureterolithiasis, perinephric stranding or solid renal mass. --Left kidney/ureter: No hydronephrosis, nephroureterolithiasis, perinephric stranding or solid renal mass. --Urinary bladder: Normal for degree of distention STOMACH/BOWEL: --Stomach/Duodenum: There is no hiatal hernia or other gastric abnormality. The duodenal course and caliber are normal. --Small bowel: There is a right lower quadrant ventral abdominal hernia that contains portions of small bowel, including a segment with an anastomotic junction and fecalized contents. There is extraluminal gas surrounding the segment, some of which is probably intramural (pneumatosis) and some of which appears to be intraperitoneal. There is moderate surrounding inflammatory change. --Colon: No focal abnormality. --Appendix: Normal. VASCULAR/LYMPHATIC: Atherosclerotic calcification is present within the non-aneurysmal abdominal aorta, without hemodynamically significant stenosis. No abdominal or pelvic lymphadenopathy. REPRODUCTIVE: Enlarged prostate measures  5 cm in transverse dimension. MUSCULOSKELETAL. Grade 1 anterolisthesis at L4-5 due to facet arthrosis. Large Schmorl's node at superior endplate of Z66. OTHER: Right inguinal hernia contains a loop of nondilated small bowel. IMPRESSION: 1. Right ventral abdominal hernia containing loops of small bowel, with suspected pneumatosis and likely perforation at the site of prior anastomosis. This likely indicates a short segment of ischemic bowel. 2. 5 x 5 mm stone in the distal common bile duct without biliary dilatation. 3. Right inguinal hernia containing loop of nondilated small bowel. 4.  Aortic Atherosclerosis (ICD10-I70.0). Critical Value/emergent results were called by telephone at the time of interpretation on 06/25/2018 at 110:56 PM to Dr. Lajean Saver , who verbally acknowledged these results. Electronically Signed   By: Ulyses Jarred M.D.   On: 06/25/2018 23:06    Procedures Procedures (including critical care time)  Medications Ordered in ED Medications  sodium chloride 0.9 % bolus 500 mL (500 mLs Intravenous New Bag/Given 06/25/18 2111)  morphine 2 MG/ML injection 2 mg (2 mg Intravenous Given 06/25/18 2111)  ondansetron (ZOFRAN) injection 4 mg (4 mg Intravenous Given 06/25/18 2111)     Initial Impression / Assessment and Plan / ED Course  I have reviewed the triage vital signs and the nursing notes.  Pertinent labs & imaging results that were available during my care of the patient were reviewed by me and considered in my medical decision making (see chart for details).  Iv ns bolus. Morphine 2 mg iv. zofran iv. Labs. Imaging ordered.  Reviewed nursing notes and prior charts for additional history.   Labs reviewed - wbc 10.   Ct reviewed - ?ischemic segment bowel w perf.   Radiology called w ct result at approx 2300 - generally surgery consulted - discussed with Dr  Harlow Asa - he reviewed ct - indicates he will admit, iv abx, npo, and requests hospitalists be consulted to help manage  other medical  issues.   hospitalists called for consult.   Recheck pt - pain improved. bp normal.     Final Clinical Impressions(s) / ED Diagnoses   Final diagnoses:  None    ED Discharge Orders    None           Lajean Saver, MD 06/25/18 2342

## 2018-06-26 ENCOUNTER — Encounter (HOSPITAL_COMMUNITY): Admission: EM | Disposition: A | Discharge status: Skilled Nursing Facility | Payer: Self-pay | Source: Home / Self Care

## 2018-06-26 ENCOUNTER — Inpatient Hospital Stay (HOSPITAL_COMMUNITY): Payer: Medicare Other

## 2018-06-26 ENCOUNTER — Inpatient Hospital Stay (HOSPITAL_COMMUNITY): Payer: Medicare Other | Admitting: Anesthesiology

## 2018-06-26 ENCOUNTER — Encounter (HOSPITAL_COMMUNITY): Payer: Self-pay | Admitting: Physician Assistant

## 2018-06-26 ENCOUNTER — Telehealth: Payer: Self-pay

## 2018-06-26 DIAGNOSIS — I480 Paroxysmal atrial fibrillation: Secondary | ICD-10-CM

## 2018-06-26 DIAGNOSIS — I255 Ischemic cardiomyopathy: Secondary | ICD-10-CM

## 2018-06-26 DIAGNOSIS — K559 Vascular disorder of intestine, unspecified: Secondary | ICD-10-CM

## 2018-06-26 DIAGNOSIS — I10 Essential (primary) hypertension: Secondary | ICD-10-CM

## 2018-06-26 DIAGNOSIS — I5022 Chronic systolic (congestive) heart failure: Secondary | ICD-10-CM

## 2018-06-26 DIAGNOSIS — K432 Incisional hernia without obstruction or gangrene: Secondary | ICD-10-CM

## 2018-06-26 DIAGNOSIS — Z01810 Encounter for preprocedural cardiovascular examination: Secondary | ICD-10-CM

## 2018-06-26 DIAGNOSIS — Z95810 Presence of automatic (implantable) cardiac defibrillator: Secondary | ICD-10-CM

## 2018-06-26 HISTORY — PX: LAPAROTOMY: SHX154

## 2018-06-26 LAB — BASIC METABOLIC PANEL
Anion gap: 11 (ref 5–15)
BUN: 65 mg/dL — ABNORMAL HIGH (ref 8–23)
CHLORIDE: 98 mmol/L (ref 98–111)
CO2: 25 mmol/L (ref 22–32)
CREATININE: 2.1 mg/dL — AB (ref 0.61–1.24)
Calcium: 8.9 mg/dL (ref 8.9–10.3)
GFR calc non Af Amer: 26 mL/min — ABNORMAL LOW (ref >60)
GFR, EST AFRICAN AMERICAN: 30 mL/min — AB (ref >60)
Glucose, Bld: 181 mg/dL — ABNORMAL HIGH (ref 70–99)
Potassium: 4.4 mmol/L (ref 3.5–5.1)
SODIUM: 134 mmol/L — AB (ref 135–145)

## 2018-06-26 LAB — TYPE AND SCREEN
ABO/RH(D): B POS
Antibody Screen: NEGATIVE

## 2018-06-26 LAB — ABO/RH: ABO/RH(D): B POS

## 2018-06-26 LAB — MRSA PCR SCREENING: MRSA by PCR: NEGATIVE

## 2018-06-26 LAB — TROPONIN I: Troponin I: 0.04 ng/mL (ref <0.03)

## 2018-06-26 LAB — PREALBUMIN: PREALBUMIN: 16.1 mg/dL — AB (ref 18–38)

## 2018-06-26 LAB — PROTIME-INR
INR: 0.94
PROTHROMBIN TIME: 12.5 s (ref 11.4–15.2)

## 2018-06-26 LAB — MAGNESIUM: MAGNESIUM: 2.1 mg/dL (ref 1.7–2.4)

## 2018-06-26 LAB — BRAIN NATRIURETIC PEPTIDE: B Natriuretic Peptide: 433 pg/mL — ABNORMAL HIGH (ref 0.0–100.0)

## 2018-06-26 SURGERY — LAPAROTOMY, EXPLORATORY
Anesthesia: General

## 2018-06-26 MED ORDER — ONDANSETRON HCL 4 MG/2ML IJ SOLN
INTRAMUSCULAR | Status: DC | PRN
  Administered 2018-06-26: 4 mg via INTRAVENOUS

## 2018-06-26 MED ORDER — ACETAMINOPHEN 10 MG/ML IV SOLN
1000.0000 mg | Freq: Four times a day (QID) | INTRAVENOUS | Status: DC
  Administered 2018-06-26 – 2018-06-27 (×3): 1000 mg via INTRAVENOUS
  Filled 2018-06-26 (×3): qty 100

## 2018-06-26 MED ORDER — FENTANYL CITRATE (PF) 100 MCG/2ML IJ SOLN
INTRAMUSCULAR | Status: DC | PRN
  Administered 2018-06-26: 50 ug via INTRAVENOUS

## 2018-06-26 MED ORDER — ALBUMIN HUMAN 5 % IV SOLN
INTRAVENOUS | Status: DC | PRN
  Administered 2018-06-26: 11:00:00 via INTRAVENOUS

## 2018-06-26 MED ORDER — METOPROLOL TARTRATE 5 MG/5ML IV SOLN
2.5000 mg | Freq: Four times a day (QID) | INTRAVENOUS | Status: DC
  Filled 2018-06-26: qty 5

## 2018-06-26 MED ORDER — FENTANYL CITRATE (PF) 100 MCG/2ML IJ SOLN: 50.0000 ug | Freq: Once | INTRAMUSCULAR | Status: DC

## 2018-06-26 MED ORDER — SUCCINYLCHOLINE CHLORIDE 20 MG/ML IJ SOLN
INTRAMUSCULAR | Status: DC | PRN
  Administered 2018-06-26: 100 mg via INTRAVENOUS

## 2018-06-26 MED ORDER — LACTATED RINGERS IV SOLN
INTRAVENOUS | Status: DC | PRN
  Administered 2018-06-26 (×2): via INTRAVENOUS

## 2018-06-26 MED ORDER — PIPERACILLIN-TAZOBACTAM IN DEX 2-0.25 GM/50ML IV SOLN
2.2500 g | Freq: Four times a day (QID) | INTRAVENOUS | Status: DC
  Administered 2018-06-26: 2.25 g via INTRAVENOUS
  Filled 2018-06-26 (×2): qty 50

## 2018-06-26 MED ORDER — PHENYLEPHRINE HCL 10 MG/ML IJ SOLN
INTRAMUSCULAR | Status: DC | PRN
  Administered 2018-06-26: 25 ug/min via INTRAVENOUS

## 2018-06-26 MED ORDER — FAMOTIDINE IN NACL 20-0.9 MG/50ML-% IV SOLN: 20.0000 mg | Freq: Two times a day (BID) | INTRAVENOUS | Status: DC

## 2018-06-26 MED ORDER — METOPROLOL TARTRATE 5 MG/5ML IV SOLN
2.5000 mg | Freq: Four times a day (QID) | INTRAVENOUS | Status: DC
  Administered 2018-06-26 – 2018-07-02 (×6): 2.5 mg via INTRAVENOUS
  Filled 2018-06-26 (×9): qty 5

## 2018-06-26 MED ORDER — ROCURONIUM BROMIDE 10 MG/ML (PF) SYRINGE
PREFILLED_SYRINGE | INTRAVENOUS | Status: AC
  Filled 2018-06-26: qty 10

## 2018-06-26 MED ORDER — ETOMIDATE 2 MG/ML IV SOLN
INTRAVENOUS | Status: DC | PRN
  Administered 2018-06-26: 12 mg via INTRAVENOUS

## 2018-06-26 MED ORDER — FAMOTIDINE IN NACL 20-0.9 MG/50ML-% IV SOLN
20.0000 mg | INTRAVENOUS | Status: DC
  Administered 2018-06-26 – 2018-06-28 (×3): 20 mg via INTRAVENOUS
  Filled 2018-06-26 (×5): qty 50

## 2018-06-26 MED ORDER — SUGAMMADEX SODIUM 200 MG/2ML IV SOLN
INTRAVENOUS | Status: DC | PRN
  Administered 2018-06-26: 150 mg via INTRAVENOUS

## 2018-06-26 MED ORDER — ONDANSETRON HCL 4 MG/2ML IJ SOLN
INTRAMUSCULAR | Status: AC
  Filled 2018-06-26: qty 2

## 2018-06-26 MED ORDER — SUCCINYLCHOLINE CHLORIDE 200 MG/10ML IV SOSY
PREFILLED_SYRINGE | INTRAVENOUS | Status: AC
  Filled 2018-06-26: qty 10

## 2018-06-26 MED ORDER — FENTANYL CITRATE (PF) 250 MCG/5ML IJ SOLN
INTRAMUSCULAR | Status: AC
  Filled 2018-06-26: qty 5

## 2018-06-26 MED ORDER — ETOMIDATE 2 MG/ML IV SOLN
INTRAVENOUS | Status: AC
  Filled 2018-06-26: qty 10

## 2018-06-26 MED ORDER — SODIUM CHLORIDE 0.9 % IV SOLN
INTRAVENOUS | Status: DC
  Administered 2018-06-26: 17:00:00 via INTRAVENOUS

## 2018-06-26 MED ORDER — FENTANYL CITRATE (PF) 100 MCG/2ML IJ SOLN
25.0000 ug | INTRAMUSCULAR | Status: DC | PRN
  Administered 2018-06-26 (×3): 25 ug via INTRAVENOUS

## 2018-06-26 MED ORDER — LACTATED RINGERS IV SOLN
INTRAVENOUS | Status: DC | PRN
  Administered 2018-06-26: 10:00:00 via INTRAVENOUS

## 2018-06-26 MED ORDER — FENTANYL CITRATE (PF) 100 MCG/2ML IJ SOLN
INTRAMUSCULAR | Status: AC
  Filled 2018-06-26: qty 2

## 2018-06-26 MED ORDER — LIDOCAINE HCL (CARDIAC) PF 100 MG/5ML IV SOSY
PREFILLED_SYRINGE | INTRAVENOUS | Status: DC | PRN
  Administered 2018-06-26: 50 mg via INTRAVENOUS

## 2018-06-26 MED ORDER — ROCURONIUM BROMIDE 100 MG/10ML IV SOLN
INTRAVENOUS | Status: DC | PRN
  Administered 2018-06-26: 40 mg via INTRAVENOUS
  Administered 2018-06-26 (×2): 5 mg via INTRAVENOUS
  Administered 2018-06-26: 10 mg via INTRAVENOUS

## 2018-06-26 MED ORDER — PROMETHAZINE HCL 25 MG/ML IJ SOLN
6.2500 mg | INTRAMUSCULAR | Status: DC | PRN
Start: 2018-06-26 — End: 2018-06-26

## 2018-06-26 MED ORDER — SODIUM CHLORIDE 0.9 % IV SOLN: INTRAVENOUS | Status: AC

## 2018-06-26 MED ORDER — SODIUM CHLORIDE 0.9 % IV SOLN
2.0000 g | INTRAVENOUS | Status: AC
  Administered 2018-06-26: 2 g via INTRAVENOUS
  Filled 2018-06-26: qty 2

## 2018-06-26 MED ORDER — PIPERACILLIN-TAZOBACTAM 3.375 G IVPB
3.3750 g | Freq: Three times a day (TID) | INTRAVENOUS | Status: AC
  Administered 2018-06-26 – 2018-07-02 (×20): 3.375 g via INTRAVENOUS
  Filled 2018-06-26 (×20): qty 50

## 2018-06-26 MED ORDER — HYDROMORPHONE HCL 1 MG/ML IJ SOLN
0.5000 mg | INTRAMUSCULAR | Status: DC | PRN
  Administered 2018-06-26: 0.5 mg via INTRAVENOUS
  Administered 2018-06-26: 1 mg via INTRAVENOUS
  Administered 2018-06-27: 0.5 mg via INTRAVENOUS
  Administered 2018-06-28 – 2018-06-30 (×4): 1 mg via INTRAVENOUS
  Filled 2018-06-26 (×7): qty 1

## 2018-06-26 MED ORDER — LIDOCAINE 2% (20 MG/ML) 5 ML SYRINGE
INTRAMUSCULAR | Status: AC
  Filled 2018-06-26: qty 5

## 2018-06-26 MED ORDER — SODIUM CHLORIDE 0.9 % IV BOLUS
500.0000 mL | Freq: Once | INTRAVENOUS | Status: AC
  Administered 2018-06-26: 500 mL via INTRAVENOUS

## 2018-06-26 MED ORDER — PROPOFOL 10 MG/ML IV BOLUS
INTRAVENOUS | Status: AC
  Filled 2018-06-26: qty 20

## 2018-06-26 MED ORDER — PHENYLEPHRINE HCL 10 MG/ML IJ SOLN
INTRAMUSCULAR | Status: AC
  Filled 2018-06-26: qty 1

## 2018-06-26 MED ORDER — 0.9 % SODIUM CHLORIDE (POUR BTL) OPTIME
TOPICAL | Status: DC | PRN
  Administered 2018-06-26: 2000 mL

## 2018-06-26 MED ORDER — MORPHINE SULFATE (PF) 4 MG/ML IV SOLN: 1.0000 mg | INTRAVENOUS | Status: DC | PRN

## 2018-06-26 SURGICAL SUPPLY — 49 items
BLADE EXTENDED COATED 6.5IN (ELECTRODE) ×3 IMPLANT
CANISTER PREVENA PLUS 150 (CANNISTER) ×3 IMPLANT
CHLORAPREP W/TINT 26ML (MISCELLANEOUS) ×3 IMPLANT
COVER MAYO STAND STRL (DRAPES) ×3 IMPLANT
COVER WAND RF STERILE (DRAPES) IMPLANT
DRAIN CHANNEL 19F RND (DRAIN) IMPLANT
DRAPE LAPAROSCOPIC ABDOMINAL (DRAPES) ×3 IMPLANT
DRAPE LG THREE QUARTER DISP (DRAPES) IMPLANT
DRAPE UTILITY XL STRL (DRAPES) ×3 IMPLANT
DRAPE WARM FLUID 44X44 (DRAPE) ×3 IMPLANT
DRESSING PREVENA PLUS CUSTOM (GAUZE/BANDAGES/DRESSINGS) ×2 IMPLANT
DRSG PREVENA PLUS CUSTOM (GAUZE/BANDAGES/DRESSINGS) ×6
ELECT REM PT RETURN 15FT ADLT (MISCELLANEOUS) ×3 IMPLANT
EVACUATOR SILICONE 100CC (DRAIN) IMPLANT
GAUZE SPONGE 4X4 12PLY STRL (GAUZE/BANDAGES/DRESSINGS) ×3 IMPLANT
GLOVE ECLIPSE 8.0 STRL XLNG CF (GLOVE) ×6 IMPLANT
GLOVE INDICATOR 8.0 STRL GRN (GLOVE) ×3 IMPLANT
GOWN STRL REUS W/TWL XL LVL3 (GOWN DISPOSABLE) ×6 IMPLANT
HANDLE SUCTION POOLE (INSTRUMENTS) ×1 IMPLANT
KIT BASIN OR (CUSTOM PROCEDURE TRAY) ×3 IMPLANT
KIT PREVENA INCISION MGT20CM45 (CANNISTER) ×3 IMPLANT
LEGGING LITHOTOMY PAIR STRL (DRAPES) IMPLANT
LIGASURE IMPACT 36 18CM CVD LR (INSTRUMENTS) ×3 IMPLANT
PACK GENERAL/GYN (CUSTOM PROCEDURE TRAY) ×3 IMPLANT
PAD POSITIONING PINK XL (MISCELLANEOUS) ×3 IMPLANT
RELOAD PROXIMATE 75MM BLUE (ENDOMECHANICALS) ×15 IMPLANT
RELOAD PROXIMATE TA60MM BLUE (ENDOMECHANICALS) ×3 IMPLANT
SPONGE LAP 18X18 RF (DISPOSABLE) IMPLANT
SPONGE LAP 18X18 X RAY DECT (DISPOSABLE) ×6 IMPLANT
STAPLER GUN LINEAR PROX 60 (STAPLE) ×3 IMPLANT
STAPLER PROXIMATE 75MM BLUE (STAPLE) ×3 IMPLANT
STAPLER VISISTAT 35W (STAPLE) ×3 IMPLANT
SUCTION POOLE HANDLE (INSTRUMENTS) ×3
SUT ETHILON 3 0 PS 1 (SUTURE) IMPLANT
SUT NOVA 1 T20/GS 25DT (SUTURE) ×30 IMPLANT
SUT PDS AB 1 CTX 36 (SUTURE) IMPLANT
SUT PDS AB 1 TP1 96 (SUTURE) IMPLANT
SUT SILK 2 0 (SUTURE) ×3
SUT SILK 2 0 SH CR/8 (SUTURE) ×6 IMPLANT
SUT SILK 2-0 18XBRD TIE 12 (SUTURE) ×1 IMPLANT
SUT SILK 3 0 (SUTURE) ×3
SUT SILK 3 0 SH CR/8 (SUTURE) ×3 IMPLANT
SUT SILK 3-0 18XBRD TIE 12 (SUTURE) ×1 IMPLANT
SUT VIC AB 2-0 SH 18 (SUTURE) IMPLANT
SUT VIC AB 3-0 SH 18 (SUTURE) ×3 IMPLANT
TOWEL OR 17X26 10 PK STRL BLUE (TOWEL DISPOSABLE) ×6 IMPLANT
TOWEL OR NON WOVEN STRL DISP B (DISPOSABLE) ×3 IMPLANT
TRAY FOLEY MTR SLVR 14FR STAT (SET/KITS/TRAYS/PACK) IMPLANT
TRAY FOLEY MTR SLVR 16FR STAT (SET/KITS/TRAYS/PACK) ×3 IMPLANT

## 2018-06-26 NOTE — Anesthesia Procedure Notes (Signed)
Anesthesia Procedure Image    

## 2018-06-26 NOTE — Progress Notes (Signed)
Pharmacy Antibiotic Note  [name] is a 84 y.o. male admitted on 06/25/2018 with Intra-abdominal infection.  Pharmacy has been consulted for zosyn dosing.  Plan: Zosyn 3.375gm iv x1, then 2.25gm iv q6hr  Height: 5\' 4"  (162.6 cm) Weight: 150 lb 5.7 oz (68.2 kg) IBW/kg (Calculated) : 59.2  Temp (24hrs), Avg:97.9 F (36.6 C), Min:97.7 F (36.5 C), Max:98 F (36.7 C)  Recent Labs  Lab 06/25/18 2117  WBC 9.0  CREATININE 2.23*    Estimated Creatinine Clearance: 18.8 mL/min (A) (by C-G formula based on SCr of 2.23 mg/dL (H)).    No Known Allergies  Antimicrobials this admission: Zosyn 06/25/2018 >>   Dose adjustments this admission: -  Microbiology results: -  Thank you for allowing pharmacy to be a part of this patient's care.  Nani Skillern Crowford 06/26/2018 4:48 AM

## 2018-06-26 NOTE — Transfer of Care (Signed)
Immediate Anesthesia Transfer of Care Note  Patient: [name]  Procedure(s) Performed: EXPLORATORY LAPAROTOMY SMALL BOWEL RESECTION X 2 (N/A )  Patient Location: PACU  Anesthesia Type:General  Level of Consciousness: awake, alert  and oriented  Airway & Oxygen Therapy: Patient Spontanous Breathing and Patient connected to face mask oxygen  Post-op Assessment: Report given to RN and Post -op Vital signs reviewed and stable  Post vital signs: Reviewed and stable  Last Vitals:  Vitals Value Taken Time  BP    Temp    Pulse    Resp    SpO2      Last Pain:  Vitals:   06/26/18 0902  TempSrc:   PainSc: 3       Patients Stated Pain Goal: 2 (07/21/51 0802)  Complications: No apparent anesthesia complications

## 2018-06-26 NOTE — ED Notes (Signed)
Gave report to [place], RN for room 1223.

## 2018-06-26 NOTE — Progress Notes (Signed)
CC: 8/10 right-sided pain similar to prior bowel perforation  Subjective: Pt says pain came on acutely and he came to the ED.  Similar to pain he had with last major bowel perforation.  He is comfortable in the ICU right now, but very tender to any palpation right side with  some rebound.    Objective: Vital signs in last 24 hours: Temp:  [97.7 F (36.5 C)-98 F (36.7 C)] 97.7 F (36.5 C) (10/22 0130) Pulse Rate:  [58-63] 62 (10/22 0700) Resp:  [15-26] 20 (10/22 0700) BP: (98-120)/(33-55) 103/39 (10/22 0700) SpO2:  [94 %-100 %] 96 % (10/22 0700) Weight:  [68.2 kg-68.9 kg] 68.2 kg (10/22 0130) Last BM Date: 06/25/18  175 urine recorded Afebrile, SBP 90's - 120's Glucose 200, creatinine 1.92 >> 2.23 - this appears to be within his baseline WBC 9.0 H/H 12/36 CT 10/21: Right ventral abdominal hernia containing loops of small bowel, with suspected pneumatosis and likely perforation at the site of prior anastomosis. This likely indicates a short segment of ischemic bowel.   5 x 5 mm stone in the distal common bile duct without biliary dilatation.  Right inguinal hernia containing loop of nondilated small bowel.   Intake/Output from previous day: 10/21 0701 - 10/22 0700 In: 50 [IV Piggyback:50] Out: 175 [Urine:175] Intake/Output this shift: No intake/output data recorded.  General appearance: alert, cooperative, no distress and has some DOE, no PND or orthopnea, no chest pain Resp: clear to auscultation bilaterally Cardio: he is paced on telem with some PVC's GI: Most tender on the right, with multiple abdominal scars from prior surgeries. No BS, some rebound Extremities: no edema  Lab Results:  Recent Labs    06/25/18 2117  WBC 9.0  HGB 12.0*  HCT 36.3*  PLT 147*    BMET Recent Labs    06/25/18 2117  NA 133*  K 4.3  CL 96*  CO2 25  GLUCOSE 200*  BUN 66*  CREATININE 2.23*  CALCIUM 9.4   PT/INR No results for input(s): LABPROT, INR in the last 72  hours.  Recent Labs  Lab 06/25/18 2117  AST 19  ALT 11  ALKPHOS 96  BILITOT 1.2  PROT 7.6  ALBUMIN 4.1     Lipase     Component Value Date/Time   LIPASE 31 10/31/2014 0230   Prior to Admission medications   Medication Sig Start Date End Date Taking? Authorizing Provider  acetaminophen (TYLENOL) 500 MG tablet Take 500 mg by mouth 2 (two) times daily as needed for mild pain.   Yes [provider]  acidophilus (RISAQUAD) CAPS capsule Take 1 capsule by mouth daily.   Yes [provider]  allopurinol (ZYLOPRIM) 300 MG tablet Take 300 mg by mouth daily.     Yes [provider]  atorvastatin (LIPITOR) 40 MG tablet Take 1 tablet (40 mg total) by mouth daily at 6 PM. 03/06/18  Yes Larey Dresser, MD  carvedilol (COREG) 6.25 MG tablet Take 1.5 tablets (9.375 mg total) by mouth 2 (two) times daily with a meal. 03/27/18  Yes Larey Dresser, MD  escitalopram (LEXAPRO) 10 MG tablet Take 1 tablet (10 mg total) by mouth daily. 06/28/15  Yes Ghimire, Henreitta Leber, MD  esomeprazole (NEXIUM) 20 MG capsule Take 20 mg by mouth every morning.    Yes [provider]  famotidine (PEPCID) 20 MG tablet Take 20 mg by mouth daily.    Yes [provider]  FINASTERIDE PO Take 1 tablet  by mouth every evening.   Yes [provider]  ketotifen (ZADITOR) 0.025 % ophthalmic solution Place 1 drop into both eyes daily as needed (dry eyes).    Yes [provider]  LORazepam (ATIVAN) 0.5 MG tablet Take 0.5 mg by mouth 2 (two) times daily as needed for anxiety.  08/22/16  Yes [provider]  PATADAY 0.2 % SOLN Place 1 drop into both eyes daily.  01/14/14  Yes [provider]  RA ASPIRIN ADULT LOW STRENGTH 81 MG EC tablet Take 1 tablet by mouth daily. 09/01/14  Yes [provider]  spironolactone (ALDACTONE) 25 MG tablet Take 0.5 tablets (12.5 mg total) by mouth daily. 05/29/18  Yes Larey Dresser, MD  torsemide (DEMADEX) 20 MG tablet  Take 2 tablets (40 mg total) every other day alternating with 1 tablet (20 mg total) every other day. 05/21/18  Yes Larey Dresser, MD  NITROSTAT 0.4 MG SL tablet Place 0.4 mg under the tongue every 5 (five) minutes as needed for chest pain (MAX 3 TABLETS).  12/05/12   [provider]  oxyCODONE (OXY IR/ROXICODONE) 5 MG immediate release tablet Take 1 tablet (5 mg total) by mouth every 6 (six) hours as needed for moderate pain. Patient not taking: Reported on 06/25/2018 06/28/15   Jonetta Osgood, MD  tiZANidine (ZANAFLEX) 2 MG tablet Take 1 tablet (2 mg total) by mouth every 8 (eight) hours as needed for muscle spasms. Patient not taking: Reported on 06/25/2018 06/18/15   Gareth Morgan, MD     Medications:   . sodium chloride Stopped (06/26/18 0107)  . famotidine (PEPCID) IV    . piperacillin-tazobactam (ZOSYN)  IV Stopped (06/26/18 0174)   Anti-infectives (From admission, onward)   Start     Dose/Rate Route Frequency Ordered Stop   06/26/18 0600  piperacillin-tazobactam (ZOSYN) IVPB 2.25 g     2.25 g 100 mL/hr over 30 Minutes Intravenous Every 6 hours 06/26/18 0447     06/25/18 2345  piperacillin-tazobactam (ZOSYN) IVPB 3.375 g     3.375 g 100 mL/hr over 30 Minutes Intravenous  Once 06/25/18 2341 06/26/18 0031   06/25/18 2315  ceFEPIme (MAXIPIME) 2 g in sodium chloride 0.9 % 100 mL IVPB  Status:  Discontinued     2 g 200 mL/hr over 30 Minutes Intravenous  Once 06/25/18 2306 06/25/18 2341   06/25/18 2315  metroNIDAZOLE (FLAGYL) IVPB 500 mg  Status:  Discontinued     500 mg 100 mL/hr over 60 Minutes Intravenous Every 8 hours 06/25/18 2306 06/25/18 2341      Assessment/Plan CAD/ischemic cardiomyopathy/Hx VT/AICD - Coreg Hx systolic congestive heart failure (EF 94%, grade 1 diastolic dysfunction) - Demadex/Spironolactone Hypertension Hyperlipidemia GERD Gout Hx diverticulosis Chronic kidney dz (baseline creatinine 1.7 -2.15) Chronic anemia   Ventral incisional  hernia with ischemic small bowel, possible contained perforation Hx bowel resection/cholecystectomy  FEN: NPO/IV fluids  ID:  Zosyn 10/21 =>>  Day 2 DVT:  SCD Foley:  In  Follow up:  Dr. Dema Severin  Plan: We requested an urgent consult from cardiology.  We will update his labs this morning, get a baseline EKG for today.  He was started on Zosyn last evening.  His IVs are just to keep open currently.  We will plan surgery later this a.m.     LOS: 1 day    Latresa Gasser 06/26/2018 [phone]

## 2018-06-26 NOTE — Anesthesia Procedure Notes (Signed)
Central Venous Catheter Insertion Performed by: Myrtie Soman, MD, anesthesiologist Start/End10/22/2019 10:11 AM, 06/26/2018 10:25 AM Patient location: Pre-op. Preanesthetic checklist: patient identified, IV checked, site marked, risks and benefits discussed, surgical consent, monitors and equipment checked, pre-op evaluation, timeout performed and anesthesia consent Position: Trendelenburg Lidocaine 1% used for infiltration and patient sedated Hand hygiene performed , maximum sterile barriers used  and Seldinger technique used Catheter size: 8 Fr Total catheter length 16. Central line was placed.Double lumen Procedure performed using ultrasound guided technique. Ultrasound Notes:anatomy identified, needle tip was noted to be adjacent to the nerve/plexus identified, no ultrasound evidence of intravascular and/or intraneural injection and image(s) printed for medical record Attempts: 1 Following insertion, dressing applied, line sutured and Biopatch. Post procedure assessment: blood return through all ports  Patient tolerated the procedure well with no immediate complications.

## 2018-06-26 NOTE — Consult Note (Addendum)
[facility] Nurse requested for preoperative stoma site marking  Discussed surgical procedure and stoma creation with patient.  Explained role of the [facility] nurse team.  Provided the patient with educational booklet and provided samples of pouching options.  Answered patient's questions.   Examined patient lying, and sitting upright in order to place the marking in the patient's visual field, away from any creases or abdominal contour issues and within the rectus muscle. He was in the in the OR holding room.  Attempted to mark below the patient's belt line, but this was not possible related to significant creases which occur when he is sitting up and should be avoided if possible.   Marked for colostomy in the LLQ  __7__ cm to the left of the umbilicus and __7__DU above the umbilicus.  Marked for ileostomy in the RLQ  __7__cm to the right of the umbilicus and  __2__ cm above the umbilicus.  Patient's abdomen cleansed with CHG wipes at site markings, allowed to air dry prior to marking.[place] [facility] Nurse team will follow up with patient after surgery for continue ostomy care and teaching if he receives a colostomy.  [name] MSN, RN, [place], [place], [place]

## 2018-06-26 NOTE — Plan of Care (Signed)
  Problem: Education: Goal: Knowledge of General Education information will improve Description Including pain rating scale, medication(s)/side effects and non-pharmacologic comfort measures Outcome: Progressing   

## 2018-06-26 NOTE — Consult Note (Addendum)
Cardiology Consultation:   Patient ID: [name]; [id number]; November 02, 1926   Admit date: 06/25/2018 Date of Consult: 06/26/2018  Primary Care Provider: [name], MD Primary Cardiologist and Advanced Heart Failure:  Loralie Champagne, MD  Primary Electrophysiologist:  Cristopher Peru, MD   Patient Profile:   [name] is a 84 y.o. male with a hx of CAD s/p CABG and chronic systolic CHF from ischemic cardiomyopathy, MDT CRT-D, VT 2017 rx ATP, EF 35% by echo 09/2017, HTN, HLD, gout, home O2,  who is being seen today for the preop evaluation of surgery for an ischemic bowel at the request of Dr Harlow Asa.  History of Present Illness:   [name] was seen by Dr. Aundra Dubin 05/29/2018 and was doing well at that time.  He had no recent episodes of VT on his ICD and his EF was 35%.  Volume status was good with a weight of 155 pounds.  He is a pacing 76% of the time and V pacing 94% of the time.  He has had atrial fibrillation but less than 1% of the time.  Thoracic impedance was acceptable.  [name] remained stable.  Per his reports, he is not very active but walks a little bit every day.  He has not had chest pain.  He denies any new dyspnea on exertion.  He denies waking up with lower extremity edema.  He does not wake up in the night due to shortness of breath.  He has not had palpitations, no presyncope or syncope.  He states his abdominal pain came on suddenly yesterday.  Since then, he has not really been able to eat.  He has had some ice chips.  Except for the belly pain, he is not having any problems or difficulties.   Past Medical History:  Diagnosis Date  . AICD (automatic cardioverter/defibrillator) present   . Arthritis    "shoulders" (10/31/2014)  . CAD (coronary artery disease)    s/p CABG; s/p Pacemaker  . Diverticulosis of colon   . GERD (gastroesophageal reflux disease)   . Gout   . Hyperlipidemia   . Hypertension   . Ischemic cardiomyopathy    s/p ICD  . On  home oxygen therapy    "2L prn" (10/31/2014)  . Paroxysmal ventricular tachycardia ([place])   . SBO (small bowel obstruction) ([place]) 10/31/2014  . Shortness of breath dyspnea    with exertion  . Systolic CHF with reduced left ventricular function, NYHA class 2 ([place])     Past Surgical History:  Procedure Laterality Date  . BI-VENTRICULAR IMPLANTABLE CARDIOVERTER DEFIBRILLATOR UPGRADE N/A 01/22/2014   Procedure: BI-VENTRICULAR IMPLANTABLE CARDIOVERTER DEFIBRILLATOR UPGRADE;  Surgeon: Evans Lance, MD;  Location: [facility] CATH LAB;  Service: Cardiovascular;  Laterality: N/A;  . BOWEL RESECTION    . CARDIAC CATHETERIZATION  03/2011   [name] 01/18/2011  . CARDIAC DEFIBRILLATOR PLACEMENT  07/2003   [name] 01/18/2011  . CATARACT EXTRACTION W/ INTRAOCULAR LENS  IMPLANT, BILATERAL Bilateral   . CATARACT EXTRACTION W/PHACO Right 12/17/2014   Procedure: PHACOEMULSIFICATION CATARACT EXTRACTION WITH IOL IMPLANT RIGHT EYE;  Surgeon: Marylynn Pearson, MD;  Location: [place];  Service: Ophthalmology;  Laterality: Right;  . CHOLECYSTECTOMY    . CORONARY ANGIOPLASTY WITH STENT PLACEMENT  04/2011   2 stents/notes 05/03/2011  . CORONARY ARTERY BYPASS GRAFT  03/2003   CABG X3/notes 01/18/2011  . EYE SURGERY Bilateral    [place]  . IMPLANTABLE CARDIOVERTER DEFIBRILLATOR (ICD) GENERATOR CHANGE Left 10/12/2011   Procedure: ICD GENERATOR CHANGE;  Surgeon: Evans Lance, MD;  Location: [facility] CATH LAB;  Service: Cardiovascular;  Laterality: Left;  . PACEMAKER PLACEMENT    . PACEMAKER REVISION N/A 10/12/2011   Procedure: PACEMAKER REVISION;  Surgeon: Evans Lance, MD;  Location: [facility] C[facility] LAB;  Service: Cardiovascular;  Laterality: N/A;  . TEE WITH CARDIOVERSION  03/2003   [name] 01/18/2011  . TONSILLECTOMY       Prior to Admission medications   Medication Sig Start Date End Date Taking? Authorizing Provider  acetaminophen (TYLENOL) 500 MG tablet Take 500 mg by mouth 2 (two) times daily as needed for mild pain.   Yes [provider]  acidophilus (RISAQUAD) CAPS capsule Take 1 capsule by mouth daily.   Yes [provider]  allopurinol (ZYLOPRIM) 300 MG tablet Take 300 mg by mouth daily.     Yes [provider]  atorvastatin (LIPITOR) 40 MG tablet Take 1 tablet (40 mg total) by mouth daily at 6 PM. 03/06/18  Yes Larey Dresser, MD  carvedilol (COREG) 6.25 MG tablet Take 1.5 tablets (9.375 mg total) by mouth 2 (two) times daily with a meal. 03/27/18  Yes Larey Dresser, MD  escitalopram (LEXAPRO) 10 MG tablet Take 1 tablet (10 mg total) by mouth daily. 06/28/15  Yes Ghimire, Henreitta Leber, MD  esomeprazole (NEXIUM) 20 MG capsule Take 20 mg by mouth every morning.    Yes [provider]  famotidine (PEPCID) 20 MG tablet Take 20 mg by mouth daily.    Yes [provider]  FINASTERIDE PO Take 1 tablet by mouth every evening.   Yes [provider]  ketotifen (ZADITOR) 0.025 % ophthalmic solution Place 1 drop into both eyes daily as needed (dry eyes).    Yes [provider]  LORazepam (ATIVAN) 0.5 MG tablet Take 0.5 mg by mouth 2 (two) times daily as needed for anxiety.  08/22/16  Yes [provider]  PATADAY 0.2 % SOLN Place 1 drop into both eyes daily.  01/14/14  Yes [provider]  RA ASPIRIN ADULT LOW STRENGTH 81 MG EC tablet Take 1 tablet by mouth daily. 09/01/14  Yes [provider]  spironolactone (ALDACTONE) 25 MG tablet Take 0.5 tablets (12.5 mg total) by mouth daily. 05/29/18  Yes Larey Dresser, MD  torsemide (DEMADEX) 20 MG tablet Take 2 tablets (40 mg total) every other day alternating with 1 tablet (20 mg total) every other day. 05/21/18  Yes Larey Dresser, MD  NITROSTAT 0.4 MG SL tablet Place 0.4 mg under the tongue every 5 (five) minutes as needed for chest pain (MAX 3 TABLETS).  12/05/12   [provider]  oxyCODONE (OXY IR/ROXICODONE) 5 MG immediate release tablet Take 1 tablet (5 mg total) by mouth every 6 (six) hours  as needed for moderate pain. Patient not taking: Reported on 06/25/2018 06/28/15   Jonetta Osgood, MD  tiZANidine (ZANAFLEX) 2 MG tablet Take 1 tablet (2 mg total) by mouth every 8 (eight) hours as needed for muscle spasms. Patient not taking: Reported on 06/25/2018 06/18/15   Gareth Morgan, MD    Inpatient Medications: Scheduled Meds: . metoprolol tartrate  2.5 mg Intravenous Q6H   Continuous Infusions: . sodium chloride Stopped (06/26/18 0107)  . famotidine (PEPCID) IV    . piperacillin-tazobactam (ZOSYN)  IV Stopped (06/26/18 0625)   PRN Meds: sodium chloride, acetaminophen **OR** acetaminophen, HYDROmorphone (DILAUDID) injection, ondansetron **OR** ondansetron (ZOFRAN) IV  Allergies:   No Known Allergies  Social History:  Social History   Socioeconomic History  . Marital status: Divorced    Spouse name: Not on file  . Number of children: Not on file  . Years of education: Not on file  . Highest education level: Not on file  Occupational History  . Occupation: Retired  Scientific laboratory technician  . Financial resource strain: Not on file  . Food insecurity:    Worry: Not on file    Inability: Not on file  . Transportation needs:    Medical: Not on file    Non-medical: Not on file  Tobacco Use  . Smoking status: Never Smoker  . Smokeless tobacco: Former Systems developer    Types: Chew  . Tobacco comment: no chew in over 3 years  Substance and Sexual Activity  . Alcohol use: Yes    Comment: "might take a drink during the holidays"  . Drug use: No  . Sexual activity: Never  Lifestyle  . Physical activity:    Days per week: Not on file    Minutes per session: Not on file  . Stress: Not on file  Relationships  . Social connections:    Talks on phone: Not on file    Gets together: Not on file    Attends religious service: Not on file    Active member of club or organization: Not on file    Attends meetings of clubs or organizations: Not on file    Relationship status: Not on  file  . Intimate partner violence:    Fear of current or ex partner: Not on file    Emotionally abused: Not on file    Physically abused: Not on file    Forced sexual activity: Not on file  Other Topics Concern  . Not on file  Social History Narrative  . Not on file    Family History:   Family History  Problem Relation Age of Onset  . Heart Problems Mother   . Diabetes Mother   . Heart Problems Brother    Family Status:  Family Status  Relation Name Status  . MGF  Deceased  . PGM  Deceased  . MGM  Deceased  . PGF  Deceased  . Mother  Deceased  . Father  Deceased       Hx unknown  . Brother  Deceased    ROS:  Please see the history of present illness.  All other ROS reviewed and negative.     Physical Exam/Data:   Vitals:   06/26/18 0400 06/26/18 0500 06/26/18 0600 06/26/18 0700  BP: (!) 98/38 (!) 104/42 (!) 108/41 (!) 103/39  Pulse: 62 60 60 62  Resp: 20 15 (!) 21 20  Temp:      TempSrc:      SpO2: 95% 95% 94% 96%  Weight:      Height:        Intake/Output Summary (Last 24 hours) at 06/26/2018 0755 Last data filed at 06/26/2018 4008 Gross per 24 hour  Intake 50 ml  Output 175 ml  Net -125 ml   Filed Weights   06/25/18 2117 06/26/18 0130  Weight: 68.9 kg 68.2 kg   Body mass index is 25.81 kg/m.  General:  Well nourished, well developed, in no acute distress HEENT: normal Lymph: no adenopathy Neck: no JVD Endocrine:  No thryomegaly Vascular: No carotid bruits; upper extremity extremity pulses 2+, lower extremity pulses decreased but palpable and capillary refill is within normal limits Cardiac:  normal S1, S2; RRR; no murmur  Lungs: Decreased breath sounds bases bilaterally, no wheezing, rhonchi or rales  Abd: soft, extremely tender, no percussion or deep palpation due to the tenderness Ext: no edema Musculoskeletal:  No deformities, BUE and BLE strength normal and equal Skin: warm and dry  Neuro:  CNs 2-12 intact, no focal abnormalities  noted Psych:  Normal affect   EKG:  The EKG was personally reviewed and demonstrates:  08/25/2017, A sensed, V paced, HR 60 Telemetry:  Telemetry was personally reviewed and demonstrates: AV pacing  Relevant CV Studies:  ECHO: 09/21/2017 - Left ventricle: The cavity size was normal. Wall thickness was   normal. Basal to mid inferior akinesis, basal to mid   inferolateral akinesis, inferoseptal akinesis. The estimated   ejection fraction was 35%. Doppler parameters are consistent with   abnormal left ventricular relaxation (grade 1 diastolic   dysfunction). - Aortic valve: There was no stenosis. There was trivial   regurgitation. - Mitral valve: Mildly calcified, fibrotic annulus. There was   trivial regurgitation. - Left atrium: The atrium was mildly dilated. - Right ventricle: The cavity size was normal. Pacer wire or   catheter noted in right ventricle. Systolic function was mildly   reduced. - Right atrium: The atrium was mildly dilated. - Tricuspid valve: There was moderate regurgitation. Peak RV-RA   gradient (S): 26 mm Hg. - Pulmonary arteries: PA peak pressure: 29 mm Hg (S). - Inferior vena cava: The vessel was normal in size. The   respirophasic diameter changes were in the normal range (= 50%),   consistent with normal central venous pressure.  Impressions:  - Normal LV size with wall motion abnormalities noted above. EF   35%. Normal RV size with mildly decreased systolic function.  CATH: 2012 PROCEDURES: 1. Successful PTCA to ostial OM1 and proximal left circumflex using     2.0 x 15-mm long [place] TREK balloon. 2. Successful deployment of 2.5 x 20-mm long Promus Element drug-eluting stent in ostial OM1 and proximal left circumflex. 3. Successful postdilatation of the stent using 2.5 x 15-mm long Canon     TREK balloon. 4. Successful deployment of 3.0 x 24-mm long Promus Element drug-eluting stent in the ostial and proximal left circumflex. 5. Successful  postdilatation of the stent using 3.0 x 15 and then 3.5     x 15-mm long [place] TREK balloon. FINDINGS:  The patient had 70-95% multiple sequential stenosis in left circumflex, OM1 with filling defect at the ostium and in the proximal portion of the left circumflex with TIMI grade 3 distal flow.  Laboratory Data:  Chemistry Recent Labs  Lab 06/25/18 2117  NA 133*  K 4.3  CL 96*  CO2 25  GLUCOSE 200*  BUN 66*  CREATININE 2.23*  CALCIUM 9.4  GFRNONAA 24*  GFRAA 28*  ANIONGAP 12    Lab Results  Component Value Date   ALT 11 06/25/2018   AST 19 06/25/2018   ALKPHOS 96 06/25/2018   BILITOT 1.2 06/25/2018   Hematology Recent Labs  Lab 06/25/18 2117  WBC 9.0  RBC 3.86*  HGB 12.0*  HCT 36.3*  MCV 94.0  MCH 31.1  MCHC 33.1  RDW 14.9  PLT 147*   Cardiac EnzymesNo results for input(s): TROPONINI in the last 168 hours. No results for input(s): TROPIPOC in the last 168 hours.  BNPNo results for input(s): BNP, PROBNP in the last 168 hours.  DDimer No results for input(s): DDIMER in the last 168 hours. TSH:  Lab Results  Component Value Date  TSH 2.390 07/26/2014   Lipids: Lab Results  Component Value Date   CHOL 124 09/21/2017   HDL 47 09/21/2017   LDLCALC 66 09/21/2017   TRIG 53 09/21/2017   CHOLHDL 2.6 09/21/2017   HgbA1c: Lab Results  Component Value Date   HGBA1C 6.0 (H) 06/24/2015   Magnesium:  Magnesium  Date Value Ref Range Status  06/14/2016 1.9 1.7 - 2.4 mg/dL Final     Radiology/Studies:  Ct Abdomen Pelvis Wo Contrast  Result Date: 06/25/2018 CLINICAL DATA:  Acute abdominal pain EXAM: CT ABDOMEN AND PELVIS WITHOUT CONTRAST TECHNIQUE: Multidetector CT imaging of the abdomen and pelvis was performed following the standard protocol without IV contrast. COMPARISON:  CT abdomen pelvis 10/31/2014 FINDINGS: LOWER CHEST: Moderate cardiomegaly.  No pleural effusion. HEPATOBILIARY: The hepatic contours and density are normal. There is no intra- or  extrahepatic biliary dilatation. The gallbladder is normal. There is a 5 x 5 mm stone within the distal common bile duct. PANCREAS: The pancreatic parenchymal contours are normal and there is no ductal dilatation. There is no peripancreatic fluid collection. SPLEEN: Normal. ADRENALS/URINARY TRACT: --Adrenal glands: Normal. --Right kidney/ureter: No hydronephrosis, nephroureterolithiasis, perinephric stranding or solid renal mass. --Left kidney/ureter: No hydronephrosis, nephroureterolithiasis, perinephric stranding or solid renal mass. --Urinary bladder: Normal for degree of distention STOMACH/BOWEL: --Stomach/Duodenum: There is no hiatal hernia or other gastric abnormality. The duodenal course and caliber are normal. --Small bowel: There is a right lower quadrant ventral abdominal hernia that contains portions of small bowel, including a segment with an anastomotic junction and fecalized contents. There is extraluminal gas surrounding the segment, some of which is probably intramural (pneumatosis) and some of which appears to be intraperitoneal. There is moderate surrounding inflammatory change. --Colon: No focal abnormality. --Appendix: Normal. VASCULAR/LYMPHATIC: Atherosclerotic calcification is present within the non-aneurysmal abdominal aorta, without hemodynamically significant stenosis. No abdominal or pelvic lymphadenopathy. REPRODUCTIVE: Enlarged prostate measures 5 cm in transverse dimension. MUSCULOSKELETAL. Grade 1 anterolisthesis at L4-5 due to facet arthrosis. Large Schmorl's node at superior endplate of L97. OTHER: Right inguinal hernia contains a loop of nondilated small bowel. IMPRESSION: 1. Right ventral abdominal hernia containing loops of small bowel, with suspected pneumatosis and likely perforation at the site of prior anastomosis. This likely indicates a short segment of ischemic bowel. 2. 5 x 5 mm stone in the distal common bile duct without biliary dilatation. 3. Right inguinal hernia  containing loop of nondilated small bowel. 4.  Aortic Atherosclerosis (ICD10-I70.0). Critical Value/emergent results were called by telephone at the time of interpretation on 06/25/2018 at 110:56 PM to Dr. Lajean Saver , who verbally acknowledged these results. Electronically Signed   By: Ulyses Jarred M.D.   On: 06/25/2018 23:06    Assessment and Plan:   1. Preop cardiovascular exam:  - He has significant cardiac risk due to chronic medical conditions. - However, these conditions are stable. - The surgery is urgent - At a recent evaluation by Dr. Aundra Dubin, he was stable, Spironolactone added, no other med changes. -Continue beta-blocker in the perioperative period. -Spoke with Tomi Bamberger from [employer] on the phone, she believes that most likely he just needs a magnet. - She is coming to see him, if she does not get here before he goes to surgery, use the magnet. - We will check an ECG, no additional cardiac evaluation prior to the surgery -We will follow him with you  Otherwise, per CCS and IM Principal Problem:   Ventral incisional hernia Active Problems:   Chronic systolic heart failure (  [place])   CORONARY ARTERY BYPASS GRAFT, HX OF   Anemia of chronic disease   Hypertension   A-fib ([place])   Small bowel ischemia ([place])   For questions or updates, please contact [facility] Please consult [email or web address] for contact info under Cardiology/STEMI.   Signed, Rosaria Ferries, PA-C  06/26/2018 7:55 AM  The patient was seen, examined and discussed with Rosaria Ferries, PA-C and I agree with the above.   84 y.o. male with a hx of CAD s/p CABG and chronic systolic CHF from ischemic cardiomyopathy, MDT CRT-D, VT 2017 rx ATP, EF 35% by echo 09/2017, HTN, HLD, gout, home O2,  who is being seen today for the preop evaluation of surgery for an ischemic bowel. The patient is being followed by Dr. Aundra Dubin, last seen on 05/29/2018 and was doing well at that time.  He had no recent episodes of VT on his ICD  and his EF was 35%.  Volume status was good with a weight of 155 pounds.  He is a pacing 76% of the time and V pacing 94% of the time.  He has had atrial fibrillation but less than 1% of the time.  Thoracic impedance was acceptable. Today he is having no evidence for fluid overload, he has no JVDs, lungs are clear, no S4, no LE edema.  He is free of chest pain and SOB. He is being paced - AV sequential pacing. Its ok to use magnet in the OR. We will follow postoperatively.  Hold iv Metoprolol this am given BP 102 and HR 61 BPM.  He is considered a moderate risk for intermediate risk surgery given underlying co morbidities, however there is currently no contraindication for this patient to undergo planned surgery.  Ena Dawley, MD 06/26/2018

## 2018-06-26 NOTE — Telephone Encounter (Signed)
Spoke with patient's friend [name] [facility]) called to inform.  Patient had sharper abdominal pain yesterday and activated his medial button.  Abdominal CT scan showed bowel leakage and is currently in exploratory abdominal surgery.  Unsure how long hospitalization will be or if he will go to rehab.  She will keep me updated on his recovery.

## 2018-06-26 NOTE — Progress Notes (Signed)
Pharmacy Antibiotic Note  [name] is a 84 y.o. male admitted on 06/25/2018 with Intra-abdominal infection.  Presented with ventral hernia and CT findings concerning for perforated viscus potentially at prior small bowel anastomosis.  Pharmacy has been consulted for zosyn dosing.  10/22 SCr decreased to 2.1, CrCl ~ 20 ml/min WBC 9 Afebrile Surgical plans: OR on 10/22   Plan: Zosyn 3.375g IV Q8H infused over 4hrs.  Follow up renal fxn, culture results, and clinical course. F/u ability to de-escalate antibiotics.   Height: 5\' 4"  (162.6 cm) Weight: 150 lb 5.7 oz (68.2 kg) IBW/kg (Calculated) : 59.2  Temp (24hrs), Avg:97.9 F (36.6 C), Min:97.7 F (36.5 C), Max:98 F (36.7 C)  Recent Labs  Lab 06/25/18 2117  WBC 9.0  CREATININE 2.23*    Estimated Creatinine Clearance: 20 mL/min (A) (by C-G formula based on SCr of 2.1 mg/dL (H)).    No Known Allergies  Antimicrobials this admission: 06/25/18 Zosyn >>  Dose adjustments this admission: 10/22 Zosyn dose adjusted for renal function.   Microbiology results: 10/22 MRSA PCR: negative  Thank you for allowing pharmacy to be a part of this patient's care.  Gretta Arab PharmD, BCPS Pager [phone] 06/26/2018 7:51 AM

## 2018-06-26 NOTE — Anesthesia Postprocedure Evaluation (Signed)
Anesthesia Post Note  Patient: GARI HARTSELL  Procedure(s) Performed: EXPLORATORY LAPAROTOMY SMALL BOWEL RESECTION X 2 (N/A )     Patient location during evaluation: PACU Anesthesia Type: General Level of consciousness: awake and alert Pain management: pain level controlled Vital Signs Assessment: post-procedure vital signs reviewed and stable Respiratory status: spontaneous breathing, nonlabored ventilation, respiratory function stable and patient connected to nasal cannula oxygen Cardiovascular status: blood pressure returned to baseline and stable Postop Assessment: no apparent nausea or vomiting Anesthetic complications: no    Last Vitals:  Vitals:   06/26/18 1500 06/26/18 1600  BP: (!) 126/54   Pulse: 60 60  Resp: 16 18  Temp: 36.6 C   SpO2: 100% 100%    Last Pain:  Vitals:   06/26/18 1506  TempSrc:   PainSc: 6                  Lidia Collum

## 2018-06-26 NOTE — ED Notes (Signed)
ED TO INPATIENT HANDOFF REPORT  Name/Age/Gender [name] 84 y.o. male  Code Status    Code Status Orders  (From admission, onward)         Start     Ordered   06/25/18 2333  Full code  Continuous     06/25/18 2336        Code Status History    Date Active Date Inactive Code Status Order ID Comments User Context   06/10/2016 1719 06/15/2016 1820 Full Code [id number]  Conrad Cleona, NP Inpatient   06/24/2015 1808 06/28/2015 2000 DNR [id number]  Radene Gunning, NP ED   10/31/2014 0937 11/04/2014 1858 DNR [id number]  Melton Alar, PA-C Inpatient   07/26/2014 2002 07/30/2014 2005 Full Code [id number]  Charolette Forward, MD Inpatient   01/22/2014 1902 01/27/2014 1616 Full Code [id number]  Evans Lance, MD Inpatient   01/21/2014 2230 01/22/2014 1902 Full Code [id number]  Charolette Forward, MD ED    Advance Directive Documentation     Most Recent Value  Type of Advance Directive  Healthcare Power of Attorney  Pre-existing out of facility DNR order (yellow form or pink MOST form)  -  "MOST" Form in Place?  -      Home/SNF/Other Home  Chief Complaint Abdominal Pain  Level of Care/Admitting Diagnosis ED Disposition    ED Disposition Condition [place] [[id number]]  Level of Care: Stepdown [14]  Admit to SDU based on following criteria: Cardiac Instability:  Patients experiencing chest pain, unconfirmed MI and stable, arrhythmias and CHF requiring medical management and potentially compromising patient's stability  Admit to SDU based on following criteria: Other see comments  Comments: intestinal perforation, for surgery tomorrow after cardiology evaluation; AICD/pacer  Diagnosis: Ventral incisional hernia [[id number]]  Admitting Physician: Oglesby, MD [[id number]]  Attending Physician: CCS, MD [[id number]]  Estimated length of stay: 5 - 7 days  Certification:: I certify this patient will need inpatient services for at least 2 midnights  Bed  request comments: stepdown  PT Class (Do Not Modify): Inpatient [101]  PT Acc Code (Do Not Modify): Private [1]       Medical History Past Medical History:  Diagnosis Date  . AICD (automatic cardioverter/defibrillator) present   . Arthritis    "shoulders" (10/31/2014)  . CAD (coronary artery disease)    s/p CABG; s/p Pacemaker  . Diverticulosis of colon   . GERD (gastroesophageal reflux disease)   . Gout   . Hyperlipidemia   . Hypertension   . Ischemic cardiomyopathy    s/p ICD  . On home oxygen therapy    "2L prn" (10/31/2014)  . Paroxysmal ventricular tachycardia ([place])   . SBO (small bowel obstruction) ([place]) 10/31/2014  . Shortness of breath dyspnea    with exertion  . Systolic CHF with reduced left ventricular function, NYHA class 2 ([place])     Allergies No Known Allergies  IV Location/Drains/Wounds Patient Lines/Drains/Airways Status   Active Line/Drains/Airways    Name:   Placement date:   Placement time:   Site:   Days:   Peripheral IV 06/25/18 Right Antecubital   06/25/18    2110    Antecubital   1   Incision (Closed) 01/23/14 Chest Left   01/23/14    0800     1615   Incision (Closed) 12/17/14 Eye Right   12/17/14    1125     1287  Labs/Imaging Results for orders placed or performed during the hospital encounter of 06/25/18 (from the past 48 hour(s))  CBC     Status: Abnormal   Collection Time: 06/25/18  9:17 PM  Result Value Ref Range   WBC 9.0 4.0 - 10.5 K/uL   RBC 3.86 (L) 4.22 - 5.81 MIL/uL   Hemoglobin 12.0 (L) 13.0 - 17.0 g/dL   HCT 36.3 (L) 39.0 - 52.0 %   MCV 94.0 80.0 - 100.0 fL   MCH 31.1 26.0 - 34.0 pg   MCHC 33.1 30.0 - 36.0 g/dL   RDW 14.9 11.5 - 15.5 %   Platelets 147 (L) 150 - 400 K/uL   nRBC 0.0 0.0 - 0.2 %    Comment: Performed at [facility], [place] [address], [place], [place] [address]  Comprehensive metabolic panel     Status: Abnormal   Collection Time: 06/25/18  9:17 PM  Result Value Ref Range   Sodium  133 (L) 135 - 145 mmol/L   Potassium 4.3 3.5 - 5.1 mmol/L   Chloride 96 (L) 98 - 111 mmol/L   CO2 25 22 - 32 mmol/L   Glucose, Bld 200 (H) 70 - 99 mg/dL   BUN 66 (H) 8 - 23 mg/dL   Creatinine, Ser 2.23 (H) 0.61 - 1.24 mg/dL   Calcium 9.4 8.9 - 10.3 mg/dL   Total Protein 7.6 6.5 - 8.1 g/dL   Albumin 4.1 3.5 - 5.0 g/dL   AST 19 15 - 41 U/L   ALT 11 0 - 44 U/L   Alkaline Phosphatase 96 38 - 126 U/L   Total Bilirubin 1.2 0.3 - 1.2 mg/dL   GFR calc non Af Amer 24 (L) >60 mL/min   GFR calc Af Amer 28 (L) >60 mL/min    Comment: (NOTE) The eGFR has been calculated using the CKD EPI equation. This calculation has not been validated in all clinical situations. eGFR's persistently <60 mL/min signify possible Chronic Kidney Disease.    Anion gap 12 5 - 15    Comment: Performed at [facility], [place] [address], [place], [place] [address]  Urinalysis, Routine w reflex microscopic     Status: Abnormal   Collection Time: 06/25/18  9:17 PM  Result Value Ref Range   Color, Urine STRAW (A) YELLOW   APPearance CLEAR CLEAR   Specific Gravity, Urine 1.008 1.005 - 1.030   pH 5.0 5.0 - 8.0   Glucose, UA NEGATIVE NEGATIVE mg/dL   Hgb urine dipstick NEGATIVE NEGATIVE   Bilirubin Urine NEGATIVE NEGATIVE   Ketones, ur NEGATIVE NEGATIVE mg/dL   Protein, ur NEGATIVE NEGATIVE mg/dL   Nitrite NEGATIVE NEGATIVE   Leukocytes, UA NEGATIVE NEGATIVE    Comment: Performed at [facility] [address] [place], [place] [address]   Ct Abdomen Pelvis Wo Contrast  Result Date: 06/25/2018 CLINICAL DATA:  Acute abdominal pain EXAM: CT ABDOMEN AND PELVIS WITHOUT CONTRAST TECHNIQUE: Multidetector CT imaging of the abdomen and pelvis was performed following the standard protocol without IV contrast. COMPARISON:  CT abdomen pelvis 10/31/2014 FINDINGS: LOWER CHEST: Moderate cardiomegaly.  No pleural effusion. HEPATOBILIARY: The hepatic contours and density are normal. There is no  intra- or extrahepatic biliary dilatation. The gallbladder is normal. There is a 5 x 5 mm stone within the distal common bile duct. PANCREAS: The pancreatic parenchymal contours are normal and there is no ductal dilatation. There is no peripancreatic fluid collection. SPLEEN: Normal. ADRENALS/URINARY TRACT: --Adrenal glands: Normal. --  Right kidney/ureter: No hydronephrosis, nephroureterolithiasis, perinephric stranding or solid renal mass. --Left kidney/ureter: No hydronephrosis, nephroureterolithiasis, perinephric stranding or solid renal mass. --Urinary bladder: Normal for degree of distention STOMACH/BOWEL: --Stomach/Duodenum: There is no hiatal hernia or other gastric abnormality. The duodenal course and caliber are normal. --Small bowel: There is a right lower quadrant ventral abdominal hernia that contains portions of small bowel, including a segment with an anastomotic junction and fecalized contents. There is extraluminal gas surrounding the segment, some of which is probably intramural (pneumatosis) and some of which appears to be intraperitoneal. There is moderate surrounding inflammatory change. --Colon: No focal abnormality. --Appendix: Normal. VASCULAR/LYMPHATIC: Atherosclerotic calcification is present within the non-aneurysmal abdominal aorta, without hemodynamically significant stenosis. No abdominal or pelvic lymphadenopathy. REPRODUCTIVE: Enlarged prostate measures 5 cm in transverse dimension. MUSCULOSKELETAL. Grade 1 anterolisthesis at L4-5 due to facet arthrosis. Large Schmorl's node at superior endplate of E78. OTHER: Right inguinal hernia contains a loop of nondilated small bowel. IMPRESSION: 1. Right ventral abdominal hernia containing loops of small bowel, with suspected pneumatosis and likely perforation at the site of prior anastomosis. This likely indicates a short segment of ischemic bowel. 2. 5 x 5 mm stone in the distal common bile duct without biliary dilatation. 3. Right inguinal  hernia containing loop of nondilated small bowel. 4.  Aortic Atherosclerosis (ICD10-I70.0). Critical Value/emergent results were called by telephone at the time of interpretation on 06/25/2018 at 110:56 PM to Dr. Lajean Saver , who verbally acknowledged these results. Electronically Signed   By: Ulyses Jarred M.D.   On: 06/25/2018 23:06   None  Pending Labs Unresulted Labs (From admission, onward)   None      Vitals/Pain Today's Vitals   06/25/18 2335 06/26/18 0000 06/26/18 0030 06/26/18 0100  BP: (!) 107/52 (!) 107/48 (!) 104/48 (!) 102/49  Pulse: 60 (!) 59 60 60  Resp: (!) 23 (!) 23 (!) 23 (!) 21  Temp: 98 F (36.7 C)     TempSrc: Oral     SpO2: 98% 99% 94% 97%  Weight:      Height:      PainSc:        Isolation Precautions No active isolations  Medications Medications  acetaminophen (TYLENOL) tablet 650 mg (has no administration in time range)    Or  acetaminophen (TYLENOL) suppository 650 mg (has no administration in time range)  HYDROmorphone (DILAUDID) injection 1 mg (has no administration in time range)  ondansetron (ZOFRAN-ODT) disintegrating tablet 4 mg (has no administration in time range)    Or  ondansetron (ZOFRAN) injection 4 mg (has no administration in time range)  0.9 %  sodium chloride infusion (500 mLs Intravenous New Bag/Given 06/25/18 2340)  sodium chloride 0.9 % bolus 500 mL (0 mLs Intravenous Stopped 06/25/18 2335)  morphine 2 MG/ML injection 2 mg (2 mg Intravenous Given 06/25/18 2111)  ondansetron (ZOFRAN) injection 4 mg (4 mg Intravenous Given 06/25/18 2111)  piperacillin-tazobactam (ZOSYN) IVPB 3.375 g (0 g Intravenous Stopped 06/26/18 0031)    Mobility walks

## 2018-06-26 NOTE — Progress Notes (Signed)
Patient seen and examined at bedside, patient admitted after midnight, please see earlier detailed consult note by Gean Birchwood, MD.  -Will add NS IV fluids at a rate of 50 ml/hr for one day -Recheck BMP to ensure resolution of mildly elevated creatinine (patient is not fluid overloaded); Baseline creatinine of around 2, although has been as low as 1.6 this year.   Cordelia Poche, MD [facility] 06/26/2018, 10:17 AM

## 2018-06-26 NOTE — Anesthesia Procedure Notes (Signed)
Procedure Name: Intubation Date/Time: 06/26/2018 10:09 AM Performed by: Glory Buff, CRNA Pre-anesthesia Checklist: Patient identified, Emergency Drugs available, Suction available and Patient being monitored Patient Re-evaluated:Patient Re-evaluated prior to induction Oxygen Delivery Method: Circle system utilized Preoxygenation: Pre-oxygenation with 100% oxygen Induction Type: IV induction Ventilation: Mask ventilation without difficulty Laryngoscope Size: Mac and 3 Grade View: Grade I Tube type: Subglottic suction tube Tube size: 7.5 mm Number of attempts: 1 Airway Equipment and Method: Stylet and Oral airway Placement Confirmation: ETT inserted through vocal cords under direct vision,  positive ETCO2 and breath sounds checked- equal and bilateral Secured at: 20 cm Tube secured with: Tape Dental Injury: Teeth and Oropharynx as per pre-operative assessment

## 2018-06-26 NOTE — Telephone Encounter (Signed)
Received call from patient's friend.  She stated he made it through surgery without any complications.  He had abdominal scar tissue and also did a bowel resection to remove dead bowel.  Leak was repaired as well.  Estimated 7 day hospital stay and 2-3 week inpatient rehab.  She will call back with any changes.

## 2018-06-26 NOTE — Anesthesia Preprocedure Evaluation (Addendum)
Anesthesia Evaluation  Patient identified by MRN, date of birth, ID band Patient awake    Reviewed: Allergy & Precautions, NPO status , Patient's Chart, lab work & pertinent test results  Airway Mallampati: II  TM Distance: >3 FB Neck ROM: Full    Dental no notable dental hx.    Pulmonary neg pulmonary ROS,    Pulmonary exam normal breath sounds clear to auscultation       Cardiovascular hypertension, + CAD and +CHF  Normal cardiovascular exam+ Cardiac Defibrillator  Rhythm:Regular Rate:Normal  EF 30-35%   Neuro/Psych negative neurological ROS  negative psych ROS   GI/Hepatic Neg liver ROS, GERD  ,  Endo/Other  diabetes  Renal/GU ARFRenal disease  negative genitourinary   Musculoskeletal negative musculoskeletal ROS (+)   Abdominal   Peds negative pediatric ROS (+)  Hematology negative hematology ROS (+) anemia ,   Anesthesia Other Findings   Reproductive/Obstetrics negative OB ROS                            Anesthesia Physical Anesthesia Plan  ASA: IV and emergent  Anesthesia Plan: General   Post-op Pain Management:    Induction: Intravenous and Rapid sequence  PONV Risk Score and Plan: 2 and Ondansetron and Treatment may vary due to age or medical condition  Airway Management Planned: Oral ETT  Additional Equipment: CVP, Ultrasound Guidance Line Placement and Arterial line  Intra-op Plan:   Post-operative Plan: Possible Post-op intubation/ventilation  Informed Consent: I have reviewed the patients History and Physical, chart, labs and discussed the procedure including the risks, benefits and alternatives for the proposed anesthesia with the patient or authorized representative who has indicated his/her understanding and acceptance.   Dental advisory given  Plan Discussed with: CRNA and Surgeon  Anesthesia Plan Comments:        Anesthesia Quick Evaluation

## 2018-06-26 NOTE — Progress Notes (Signed)
Metoprolol was pulled from pyxis but not given before procedure per provider order/

## 2018-06-26 NOTE — Op Note (Signed)
06/25/2018 - 06/26/2018  1:49 PM  PATIENT:  [name]  84 y.o. male  Patient Care Team: [name], MD as PCP - General (Internal Medicine) Larey Dresser, MD as PCP - Advanced Heart Failure (Cardiology) Evans Lance, MD as PCP - Electrophysiology (Cardiology) Charolette Forward, MD as Consulting Physician (Cardiology)  PRE-OPERATIVE DIAGNOSIS:  Perforated viscus, abdominal wall hernia  POST-OPERATIVE DIAGNOSIS:  Same  PROCEDURE:   1. Exploratory laparotomy 2. Resection of small bowel x2 3. Lysis of adhesions x 90 minutes  SURGEON:  Sharon Mt. Carmie Lanpher, MD  ASSISTANT: Excell Seltzer, MD  ANESTHESIA:   general  COUNTS:  Sponge, needle and instrument counts were reported correct x2 at the conclusion of the operation.  EBL: 50cc  DRAINS: None  SPECIMEN:  1. Small bowel resection including anastomosis (distal ileum) 2. Small bowel resection  COMPLICATIONS: None  FINDINGS: Large ventral hernia with loss of domain.  Dense intra-abdominal adhesions.  After extensive adhesiolysis, and ileo-ileal anastomosis was identified and was approximately 30 to 40 cm from the ileocecal valve.  There was perforation associated with this with an abscess cavity in the mesentery.  An adjacent loop of small bowel mesentery was adherent to the abscess cavity and and breaking up the loculations, a mesenteric wrent was noted.  This required oversewing.  Following this, there was a 1 cm segment of dusky small bowel.  The prior ileoileal anastomosis which is perforated was removed and a side-to-side anastomosis performed.  The additional segment of small bowel was distant from the other resection so a separat small bowel resection was performed.  With his other significant medical comorbidities and inability to tolerate prolonged major surgery, raising flaps and performing component separation was not performed.  There were areas of fascia but this was inseparable from the skin. This was  closed en masse using #1 Prolene sutures in a vertical mattress fashion and then skin approximation with staples.  DISPOSITION: PACU in satisfactory condition  INDICATION: Mr Borghi is a pleasant [phone] with hx of CAD s/p CABG, chronic CHF systolic 2/2 ischemic cardiomyopathy, AICD, v-tach, HTN, HLD , CKD, home O2 whom presented to ED overnight with ventral hernia and CT findings concerning for perforated viscus potentially at prior small bowel anastomosis. He has stable abdominal pain today but states he feels just like when he had a perforated viscus 10 yrs ago. He underwent an exlap + small bowel resection 10 yrs ago by a former partner, Dr. Elesa Hacker with what appears to be external retention sutures.  On exam, his abdomen was soft, he had tenderness to palpation in bilateral lower quadrants. Mildly distended.  CT A/P reviewed and showed a right ventral abdominal hernia containing loops of small bowel with suspected pneumatosis and like perforation at site of prior anastomosis.  Options were discussed moving forward and he and his power of attorney opted to pursue surgical intervention.  Please refer to H&P for details regarding this discussion  DESCRIPTION: The patient was identified in preop holding and taken to the OR where they were placed on the operating room table and SCDs were placed. General endotracheal anesthesia was induced without difficulty. The patient was then prepped and draped in the usual sterile fashion. A surgical timeout was performed indicating the correct patient, procedure, positioning and need for preoperative antibiotics.   A midline incision was made in the upper midline and carried down through the skin to an infraumbilical location.  Subcutaneous tissue was incised electrocautery and the uppermost portion of  the incision where there is no prior surgery/scars perineal cavity entered at this location.  Dense although filmy adhesions were encountered in the midline and  were taken down with Metzenbaum scissors.  Incision was opened distally.  The adhesions in the midline were taken down sharply using Metzenbaum scissors. Adhesiolysis commenced laterally on both sides.  One small deserosalization to small bowel occurred which was 1 cm in length and repaired with 3-0 silk Lembert sutures.  Adhesions between small bowel and small bowel as well as small bowel mesentery were divided.  The hernia defect to the right of midline was identified and empty.  The ileoileal anastomosis was ultimately identified and there was perforation on the segment with chronic appearing ischemic changes focally approximately 1 x 1 cm in an associated abscess cavity with purulent material inside it.  There was a separate segment of small bowel which was stuck to the abscess cavity along its mesentery.  In freeing this, devascularization of a very small segment of small bowel occurred and was removed from his anastomosis.  Hemostasis within the mesentery was obtained using sutures.  Following this, there was a 1 cm long segment of small bowel which was somewhat dusky in color.  Attention was turned to performing the bowel resections.  Beginning with the ileoileal anastomosis, healthy ends on both sides were identified and the mesentery incised these locations.  The bowel was divided with GIA blue load staplers.  The intervening mesentery was ligated using a LigaSure. The specimen was passed off.  A stay suture was placed on each respective segment of bowel and enterotomies were created the antimesenteric border of each respective segment.  A stapled side-to-side functional end-to-end anastomosis was created using the GIA 65 mm blue stapler.  Staple line was inspected and noted to be hemostatic.  The common enterotomy and the associated staple lines were excised using the TA 60 blue load stapler.  The corners of the staple line were stopped.  3-0 silk "crotch" suture was placed.  The anastomosis was palpated  and widely patent and approximately 3 fingerbreadths in length.  The bowel was viable.  Second small bowel resection was performed in identical manner and incorporated a 2 cm segment of small bowel. Windows were created in the mesentery and the small bowel divided with GIA blue load staplers. The intervening mesentery was ligated using a LigaSure. The specimen was passed off.  A stay suture was placed on each respective segment of bowel and enterotomies were created the antimesenteric border of each respective segment.  A stapled side-to-side functional end-to-end anastomosis was created using the GIA 65 mm blue stapler.  The staple line was inspected and noted to be hemostatic.  The common enterotomy and the associated staple lines were excised using the TA 60 blue load stapler.  The corners of the staple line were stopped.  3-0 silk "crotch" suture was placed.  The anastomosis was palpated and widely patent and approximately 3 fingerbreadths in length.  The bowel was viable.  The small bowel was re-inspected and no other deserosalizations or injuries were identified. The mesenteric defects of both resections were closed with 3-0 vicryl suture. The abdomen was then irrigated with warm saline. Hemostasis was verified. Attention was turned to closing the abdomen. Counts were reported correct x2. There were areas where the skin and fascia were inseparable and other areas where fascia was completely retracted with loss of domain. Given his overall health and increased morbidity of raising flaps / performing component releases, the  decision was made to forego a complex abdominal closure. The abdomen was closed with en masse closure incorporating fascia where present in the bites - the vast majority was skin and hernia sac though. This was done with vertical mattress #1 prolene sutures. The skin was then irrigated; since there was very little tissue between skin and bowel, this was therefore approximated with staples  as opposed to leaving the skin open. A [place] incisional VAC was then placed over this.  The patient was awakened from anesthesia, extubated and transferred to a stretcher for transport to PACU in satisfactory condition.

## 2018-06-26 NOTE — Consult Note (Signed)
Reason for Consult: Medical management of medical issues. Referring Physician: Dr. Harlow Asa.  [name] is an 84 y.o. male.  HPI: 84 year old male with history of CAD status post CABG, ischemic cardiomyopathy status post ICD placement, history of paroxysmal ventricular tachycardia presents to the ER with complaints of abdominal pain which started off yesterday afternoon.  Pain is mostly in the right quadrants.  Denies any vomiting or diarrhea.  In the ER patient had a CT abdomen which shows ventral hernia containing perforated viscus with possible ischemic bowel.  General surgery was consulted and patient was admitted to general surgery.  Medicine was called to help with medical issues.  At this time patient denies any chest pain or shortness of breath.  Past Medical History:  Diagnosis Date  . AICD (automatic cardioverter/defibrillator) present   . Arthritis    "shoulders" (10/31/2014)  . CAD (coronary artery disease)    s/p CABG; s/p Pacemaker  . Diverticulosis of colon   . GERD (gastroesophageal reflux disease)   . Gout   . Hyperlipidemia   . Hypertension   . Ischemic cardiomyopathy    s/p ICD  . On home oxygen therapy    "2L prn" (10/31/2014)  . Paroxysmal ventricular tachycardia ([place])   . SBO (small bowel obstruction) ([place]) 10/31/2014  . Shortness of breath dyspnea    with exertion  . Systolic CHF with reduced left ventricular function, NYHA class 2 ([place])     Past Surgical History:  Procedure Laterality Date  . BI-VENTRICULAR IMPLANTABLE CARDIOVERTER DEFIBRILLATOR UPGRADE N/A 01/22/2014   Procedure: BI-VENTRICULAR IMPLANTABLE CARDIOVERTER DEFIBRILLATOR UPGRADE;  Surgeon: Evans Lance, MD;  Location: [facility] CATH LAB;  Service: Cardiovascular;  Laterality: N/A;  . BOWEL RESECTION    . CARDIAC CATHETERIZATION  03/2011   Archie Endo 01/18/2011  . CARDIAC DEFIBRILLATOR PLACEMENT  07/2003   Archie Endo 01/18/2011  . CATARACT EXTRACTION W/ INTRAOCULAR LENS  IMPLANT, BILATERAL Bilateral   .  CATARACT EXTRACTION W/PHACO Right 12/17/2014   Procedure: PHACOEMULSIFICATION CATARACT EXTRACTION WITH IOL IMPLANT RIGHT EYE;  Surgeon: Marylynn Pearson, MD;  Location: [facility];  Service: Ophthalmology;  Laterality: Right;  . CHOLECYSTECTOMY    . CORONARY ANGIOPLASTY WITH STENT PLACEMENT  04/2011   2 stents/notes 05/03/2011  . CORONARY ARTERY BYPASS GRAFT  03/2003   CABG X3/notes 01/18/2011  . EYE SURGERY Bilateral    [place]  . IMPLANTABLE CARDIOVERTER DEFIBRILLATOR (ICD) GENERATOR CHANGE Left 10/12/2011   Procedure: ICD GENERATOR CHANGE;  Surgeon: Evans Lance, MD;  Location: [facility] CATH LAB;  Service: Cardiovascular;  Laterality: Left;  . PACEMAKER PLACEMENT    . PACEMAKER REVISION N/A 10/12/2011   Procedure: PACEMAKER REVISION;  Surgeon: Evans Lance, MD;  Location: [facility] CATH LAB;  Service: Cardiovascular;  Laterality: N/A;  . TEE WITH CARDIOVERSION  03/2003   Archie Endo 01/18/2011  . TONSILLECTOMY      Family History  Problem Relation Age of Onset  . Heart Problems Mother   . Diabetes Mother   . Heart Problems Brother     Social History:  reports that he has never smoked. He has quit using smokeless tobacco.  His smokeless tobacco use included chew. He reports that he drinks alcohol. He reports that he does not use drugs.  Allergies: No Known Allergies  Medications: I have reviewed the patient's current medications.  Results for orders placed or performed during the hospital encounter of 06/25/18 (from the past 48 hour(s))  CBC     Status: Abnormal   Collection Time: 06/25/18  9:17 PM  Result Value Ref Range   WBC 9.0 4.0 - 10.5 K/uL   RBC 3.86 (L) 4.22 - 5.81 MIL/uL   Hemoglobin 12.0 (L) 13.0 - 17.0 g/dL   HCT 36.3 (L) 39.0 - 52.0 %   MCV 94.0 80.0 - 100.0 fL   MCH 31.1 26.0 - 34.0 pg   MCHC 33.1 30.0 - 36.0 g/dL   RDW 14.9 11.5 - 15.5 %   Platelets 147 (L) 150 - 400 K/uL   nRBC 0.0 0.0 - 0.2 %    Comment: Performed at [facility], [place] [address] [place],  [place] [address]  Comprehensive metabolic panel     Status: Abnormal   Collection Time: 06/25/18  9:17 PM  Result Value Ref Range   Sodium 133 (L) 135 - 145 mmol/L   Potassium 4.3 3.5 - 5.1 mmol/L   Chloride 96 (L) 98 - 111 mmol/L   CO2 25 22 - 32 mmol/L   Glucose, Bld 200 (H) 70 - 99 mg/dL   BUN 66 (H) 8 - 23 mg/dL   Creatinine, Ser 2.23 (H) 0.61 - 1.24 mg/dL   Calcium 9.4 8.9 - 10.3 mg/dL   Total Protein 7.6 6.5 - 8.1 g/dL   Albumin 4.1 3.5 - 5.0 g/dL   AST 19 15 - 41 U/L   ALT 11 0 - 44 U/L   Alkaline Phosphatase 96 38 - 126 U/L   Total Bilirubin 1.2 0.3 - 1.2 mg/dL   GFR calc non Af Amer 24 (L) >60 mL/min   GFR calc Af Amer 28 (L) >60 mL/min    Comment: (NOTE) The eGFR has been calculated using the CKD EPI equation. This calculation has not been validated in all clinical situations. eGFR's persistently <60 mL/min signify possible Chronic Kidney Disease.    Anion gap 12 5 - 15    Comment: Performed at [facility], [place] [address], [place], [place] [address]  Urinalysis, Routine w reflex microscopic     Status: Abnormal   Collection Time: 06/25/18  9:17 PM  Result Value Ref Range   Color, Urine STRAW (A) YELLOW   APPearance CLEAR CLEAR   Specific Gravity, Urine 1.008 1.005 - 1.030   pH 5.0 5.0 - 8.0   Glucose, UA NEGATIVE NEGATIVE mg/dL   Hgb urine dipstick NEGATIVE NEGATIVE   Bilirubin Urine NEGATIVE NEGATIVE   Ketones, ur NEGATIVE NEGATIVE mg/dL   Protein, ur NEGATIVE NEGATIVE mg/dL   Nitrite NEGATIVE NEGATIVE   Leukocytes, UA NEGATIVE NEGATIVE    Comment: Performed at [place] [address], [place], [place] [address]    Ct Abdomen Pelvis Wo Contrast  Result Date: 06/25/2018 CLINICAL DATA:  Acute abdominal pain EXAM: CT ABDOMEN AND PELVIS WITHOUT CONTRAST TECHNIQUE: Multidetector CT imaging of the abdomen and pelvis was performed following the standard protocol without IV contrast. COMPARISON:  CT abdomen pelvis 10/31/2014  FINDINGS: LOWER CHEST: Moderate cardiomegaly.  No pleural effusion. HEPATOBILIARY: The hepatic contours and density are normal. There is no intra- or extrahepatic biliary dilatation. The gallbladder is normal. There is a 5 x 5 mm stone within the distal common bile duct. PANCREAS: The pancreatic parenchymal contours are normal and there is no ductal dilatation. There is no peripancreatic fluid collection. SPLEEN: Normal. ADRENALS/URINARY TRACT: --Adrenal glands: Normal. --Right kidney/ureter: No hydronephrosis, nephroureterolithiasis, perinephric stranding or solid renal mass. --Left kidney/ureter: No hydronephrosis, nephroureterolithiasis, perinephric stranding or solid renal mass. --Urinary bladder: Normal for degree of distention STOMACH/BOWEL: --Stomach/Duodenum:  There is no hiatal hernia or other gastric abnormality. The duodenal course and caliber are normal. --Small bowel: There is a right lower quadrant ventral abdominal hernia that contains portions of small bowel, including a segment with an anastomotic junction and fecalized contents. There is extraluminal gas surrounding the segment, some of which is probably intramural (pneumatosis) and some of which appears to be intraperitoneal. There is moderate surrounding inflammatory change. --Colon: No focal abnormality. --Appendix: Normal. VASCULAR/LYMPHATIC: Atherosclerotic calcification is present within the non-aneurysmal abdominal aorta, without hemodynamically significant stenosis. No abdominal or pelvic lymphadenopathy. REPRODUCTIVE: Enlarged prostate measures 5 cm in transverse dimension. MUSCULOSKELETAL. Grade 1 anterolisthesis at L4-5 due to facet arthrosis. Large Schmorl's node at superior endplate of Q11. OTHER: Right inguinal hernia contains a loop of nondilated small bowel. IMPRESSION: 1. Right ventral abdominal hernia containing loops of small bowel, with suspected pneumatosis and likely perforation at the site of prior anastomosis. This likely  indicates a short segment of ischemic bowel. 2. 5 x 5 mm stone in the distal common bile duct without biliary dilatation. 3. Right inguinal hernia containing loop of nondilated small bowel. 4.  Aortic Atherosclerosis (ICD10-I70.0). Critical Value/emergent results were called by telephone at the time of interpretation on 06/25/2018 at 110:56 PM to Dr. Lajean Saver , who verbally acknowledged these results. Electronically Signed   By: Ulyses Jarred M.D.   On: 06/25/2018 23:06    Review of Systems  Constitutional: Negative.   HENT: Negative.   Eyes: Negative.   Respiratory: Negative.   Cardiovascular: Negative.   Gastrointestinal: Positive for abdominal pain.  Genitourinary: Negative.   Musculoskeletal: Negative.   Skin: Negative.   Neurological: Negative.   Psychiatric/Behavioral: Negative.    Blood pressure (!) 99/33, pulse (!) 59, temperature 97.7 F (36.5 C), temperature source Oral, resp. rate (!) 26, height '5\' 4"'$  (1.626 m), weight 68.2 kg, SpO2 95 %. Physical Exam  Constitutional: He is oriented to person, place, and time. He appears well-developed and well-nourished.  HENT:  Head: Normocephalic and atraumatic.  Eyes: Pupils are equal, round, and reactive to light. Conjunctivae are normal.  Neck: Normal range of motion. Neck supple.  Cardiovascular: Normal rate and regular rhythm.  Respiratory: Effort normal and breath sounds normal.  GI: He exhibits distension and mass. There is no guarding.  Musculoskeletal: Normal range of motion. He exhibits no edema or deformity.  Neurological: He is alert and oriented to person, place, and time.  Skin: Skin is warm and dry.  Psychiatric: His behavior is normal.    Assessment/Plan: #1.  Ventral hernia with perforated viscus and possible ischemic bowel.  Per surgery.  Patient kept n.p.o.  Empiric antibiotics. #2.  CAD status post CABG denies any chest pain.  Patient presently n.p.o. #3.  History of ischemic cardiomyopathy status post AICD  placement.  Per surgery notes cardiology be consulted and also will be having ICD managed prior to surgery.  Appears euvolemic. #4.  Patient blood pressures in the low normal closely observe.  Thank you for involving [place] in patient's care, we will closely follow along with you.  Rise Patience 06/26/2018, 2:55 AM

## 2018-06-26 NOTE — Progress Notes (Signed)
CRITICAL VALUE ALERT  Critical Value: 0.04 Troponin  Date & Time Notied: 0930 06/25/2018  Provider Notified: Dr. Meda Coffee  Orders Received/Actions taken: awaiting further instructions. Pt currently in surgery

## 2018-06-26 NOTE — Anesthesia Procedure Notes (Signed)
Arterial Line Insertion Start/End10/22/2019 10:18 AM, 06/26/2018 10:20 AM Performed by: Myrtie Soman, MD, Glory Buff, CRNA, CRNA  Patient location: OR. Preanesthetic checklist: patient identified, IV checked, site marked, risks and benefits discussed, surgical consent, monitors and equipment checked, pre-op evaluation, timeout performed and anesthesia consent Patient sedated Left, radial was placed Catheter size: 20 G Hand hygiene performed , maximum sterile barriers used  and Seldinger technique used Allen's test indicative of satisfactory collateral circulation Attempts: 1 Procedure performed without using ultrasound guided technique. Following insertion, dressing applied and Biopatch. Post procedure assessment: normal  Patient tolerated the procedure well with no immediate complications.

## 2018-06-27 ENCOUNTER — Encounter (HOSPITAL_COMMUNITY): Payer: Self-pay | Admitting: Surgery

## 2018-06-27 DIAGNOSIS — E1159 Type 2 diabetes mellitus with other circulatory complications: Secondary | ICD-10-CM

## 2018-06-27 DIAGNOSIS — Z951 Presence of aortocoronary bypass graft: Secondary | ICD-10-CM

## 2018-06-27 DIAGNOSIS — D696 Thrombocytopenia, unspecified: Secondary | ICD-10-CM

## 2018-06-27 DIAGNOSIS — D638 Anemia in other chronic diseases classified elsewhere: Secondary | ICD-10-CM

## 2018-06-27 LAB — CBC
HCT: 27.7 % — ABNORMAL LOW (ref 39.0–52.0)
Hemoglobin: 9 g/dL — ABNORMAL LOW (ref 13.0–17.0)
MCH: 31.7 pg (ref 26.0–34.0)
MCHC: 32.5 g/dL (ref 30.0–36.0)
MCV: 97.5 fL (ref 80.0–100.0)
PLATELETS: 73 10*3/uL — AB (ref 150–400)
RBC: 2.84 MIL/uL — ABNORMAL LOW (ref 4.22–5.81)
RDW: 15.1 % (ref 11.5–15.5)
WBC: 5.8 10*3/uL (ref 4.0–10.5)
nRBC: 0 % (ref 0.0–0.2)

## 2018-06-27 LAB — GLUCOSE, CAPILLARY
Glucose-Capillary: 197 mg/dL — ABNORMAL HIGH (ref 70–99)
Glucose-Capillary: 168 mg/dL — ABNORMAL HIGH (ref 70–99)

## 2018-06-27 LAB — BASIC METABOLIC PANEL
Anion gap: 11 (ref 5–15)
BUN: 52 mg/dL — ABNORMAL HIGH (ref 8–23)
CALCIUM: 8.2 mg/dL — AB (ref 8.9–10.3)
CO2: 22 mmol/L (ref 22–32)
Chloride: 103 mmol/L (ref 98–111)
Creatinine, Ser: 2.11 mg/dL — ABNORMAL HIGH (ref 0.61–1.24)
GFR, EST AFRICAN AMERICAN: 30 mL/min — AB (ref >60)
GFR, EST NON AFRICAN AMERICAN: 26 mL/min — AB (ref >60)
GLUCOSE: 162 mg/dL — AB (ref 70–99)
Potassium: 4 mmol/L (ref 3.5–5.1)
SODIUM: 136 mmol/L (ref 135–145)

## 2018-06-27 LAB — HEMOGLOBIN A1C
HEMOGLOBIN A1C: 7.3 % — AB (ref 4.8–5.6)
MEAN PLASMA GLUCOSE: 162.81 mg/dL

## 2018-06-27 MED ORDER — SODIUM CHLORIDE 0.9% FLUSH: 10.0000 mL | INTRAVENOUS | Status: DC | PRN

## 2018-06-27 MED ORDER — HEPARIN SODIUM (PORCINE) 5000 UNIT/ML IJ SOLN: 5000.0000 [IU] | Freq: Three times a day (TID) | INTRAMUSCULAR | Status: DC

## 2018-06-27 MED ORDER — SODIUM CHLORIDE 0.9% FLUSH
10.0000 mL | Freq: Two times a day (BID) | INTRAVENOUS | Status: DC
  Administered 2018-06-27 – 2018-07-02 (×6): 10 mL
  Administered 2018-07-03: 20 mL
  Administered 2018-07-03 – 2018-07-04 (×2): 10 mL

## 2018-06-27 MED ORDER — ORAL CARE MOUTH RINSE
15.0000 mL | Freq: Two times a day (BID) | OROMUCOSAL | Status: DC
  Administered 2018-06-27 – 2018-07-04 (×11): 15 mL via OROMUCOSAL

## 2018-06-27 MED ORDER — ACETAMINOPHEN 10 MG/ML IV SOLN
1000.0000 mg | Freq: Four times a day (QID) | INTRAVENOUS | Status: AC
  Administered 2018-06-27 – 2018-06-28 (×3): 1000 mg via INTRAVENOUS
  Filled 2018-06-27 (×4): qty 100

## 2018-06-27 MED ORDER — ZOLPIDEM TARTRATE 5 MG PO TABS: 5.0000 mg | ORAL_TABLET | Freq: Once | ORAL | Status: DC

## 2018-06-27 MED ORDER — DEXTROSE-NACL 5-0.9 % IV SOLN
INTRAVENOUS | Status: DC
  Administered 2018-06-27: 10:00:00 via INTRAVENOUS

## 2018-06-27 MED ORDER — CHLORHEXIDINE GLUCONATE 0.12 % MT SOLN
15.0000 mL | Freq: Two times a day (BID) | OROMUCOSAL | Status: DC
  Administered 2018-06-27 – 2018-07-04 (×14): 15 mL via OROMUCOSAL
  Filled 2018-06-27 (×12): qty 15

## 2018-06-27 MED ORDER — CHLORHEXIDINE GLUCONATE CLOTH 2 % EX PADS
6.0000 | MEDICATED_PAD | Freq: Every day | CUTANEOUS | Status: DC
  Administered 2018-06-29 – 2018-07-01 (×3): 6 via TOPICAL

## 2018-06-27 MED ORDER — LORAZEPAM 2 MG/ML IJ SOLN
0.5000 mg | Freq: Once | INTRAMUSCULAR | Status: AC
  Administered 2018-06-27: 0.5 mg via INTRAVENOUS
  Filled 2018-06-27: qty 1

## 2018-06-27 MED ORDER — INSULIN ASPART 100 UNIT/ML ~~LOC~~ SOLN
0.0000 [IU] | Freq: Three times a day (TID) | SUBCUTANEOUS | Status: DC
  Administered 2018-06-27 (×2): 2 [IU] via SUBCUTANEOUS
  Administered 2018-06-28: 1 [IU] via SUBCUTANEOUS
  Administered 2018-06-28: 2 [IU] via SUBCUTANEOUS
  Administered 2018-06-28: 1 [IU] via SUBCUTANEOUS
  Administered 2018-06-29 (×2): 2 [IU] via SUBCUTANEOUS
  Administered 2018-06-29 – 2018-06-30 (×3): 1 [IU] via SUBCUTANEOUS
  Administered 2018-07-01: 2 [IU] via SUBCUTANEOUS
  Administered 2018-07-01 (×2): 1 [IU] via SUBCUTANEOUS
  Administered 2018-07-02: 2 [IU] via SUBCUTANEOUS
  Administered 2018-07-02: 3 [IU] via SUBCUTANEOUS

## 2018-06-27 MED ORDER — SODIUM CHLORIDE 0.9 % IV BOLUS
250.0000 mL | Freq: Once | INTRAVENOUS | Status: AC
  Administered 2018-06-27: 250 mL via INTRAVENOUS

## 2018-06-27 NOTE — Consult Note (Signed)
WOC follow-up: Pt did not receive an ostomy during surgery yesterday. Please re-consult if further assistance is needed.  Thank-you,  [name] MSN, [place], [place], [place], [place]

## 2018-06-27 NOTE — Progress Notes (Signed)
1 Day Post-Op    CC: Abdominal pain  Subjective: He looks pretty good this morning.  NG is in place, we irrigated it and replaced the sump filter.  He is not putting much out through it.  Chest is clear anterior.  He does have an abdominal binder on.  Praveena wound VAC is in place.  No bowel sounds.  He has an A-line that is fairly positional but his blood pressures in the 90s this morning with a good waveform when the wrist is in the right position.  IV fluids at 50 mils per hour will defer to medicine on this.  Objective: Vital signs in last 24 hours: Temp:  [97.2 F (36.2 C)-98.7 F (37.1 C)] 97.5 F (36.4 C) (10/23 0411) Pulse Rate:  [58-67] 61 (10/23 0700) Resp:  [3-32] 16 (10/23 0700) BP: (76-126)/(32-58) 104/43 (10/23 0700) SpO2:  [93 %-100 %] 94 % (10/23 0700) Arterial Line BP: (118-134)/(31-47) 118/31 (10/22 1500) Weight:  [68.2 kg] 68.2 kg (10/22 0902) Last BM Date: 06/25/18 4341 PO 1025 IV NG 140 Afebrile, BP one BP 64/32 other range between mid 80's - 100 range HR in the 60's Glucose 162, Creatinine is stable at 2.11 WBC 5.8 H/H down 12/36 >> 9.0/27.7 A1C 7.3   Intake/Output from previous day: 10/22 0701 - 10/23 0700 In: 4341 [I.V.:2789.2; IV Piggyback:1551.8] Out: 0623 [Urine:1025; Emesis/NG output:140; Blood:50] Intake/Output this shift: No intake/output data recorded.  General appearance: alert, cooperative and Cardiac rhythm shows pacing Resp: clear to auscultation bilaterally and Anterior GI: Praveena wound VAC in place.  No distention, he is fairly sore.  No bowel sounds.  Lab Results:  Recent Labs    06/25/18 2117 06/27/18 0557  WBC 9.0 5.8  HGB 12.0* 9.0*  HCT 36.3* 27.7*  PLT 147* 73*    BMET Recent Labs    06/26/18 0809 06/27/18 0557  NA 134* 136  K 4.4 4.0  CL 98 103  CO2 25 22  GLUCOSE 181* 162*  BUN 65* 52*  CREATININE 2.10* 2.11*  CALCIUM 8.9 8.2*   PT/INR Recent Labs    06/26/18 0809  LABPROT 12.5  INR 0.94     Recent Labs  Lab 06/25/18 2117  AST 19  ALT 11  ALKPHOS 96  BILITOT 1.2  PROT 7.6  ALBUMIN 4.1     Lipase     Component Value Date/Time   LIPASE 31 10/31/2014 0230     Medications: . fentaNYL (SUBLIMAZE) injection  50 mcg Intravenous Once  . metoprolol tartrate  2.5 mg Intravenous Q6H    . sodium chloride 500 mL (06/26/18 2258)  . sodium chloride Stopped (06/26/18 1709)  . acetaminophen Stopped (06/27/18 0600)  . famotidine (PEPCID) IV Stopped (06/26/18 2234)  . piperacillin-tazobactam (ZOSYN)  IV 12.5 mL/hr at 06/27/18 0849   Anti-infectives (From admission, onward)   Start     Dose/Rate Route Frequency Ordered Stop   06/26/18 1400  piperacillin-tazobactam (ZOSYN) IVPB 3.375 g     3.375 g 12.5 mL/hr over 240 Minutes Intravenous Every 8 hours 06/26/18 1218     06/26/18 0815  cefoTEtan (CEFOTAN) 2 g in sodium chloride 0.9 % 100 mL IVPB     2 g 200 mL/hr over 30 Minutes Intravenous On call to O.R. 06/26/18 0809 06/26/18 1103   06/26/18 0600  piperacillin-tazobactam (ZOSYN) IVPB 2.25 g  Status:  Discontinued     2.25 g 100 mL/hr over 30 Minutes Intravenous Every 6 hours 06/26/18 0447 06/26/18 1217   06/25/18 2345  piperacillin-tazobactam (ZOSYN) IVPB 3.375 g     3.375 g 100 mL/hr over 30 Minutes Intravenous  Once 06/25/18 2341 06/26/18 0031   06/25/18 2315  ceFEPIme (MAXIPIME) 2 g in sodium chloride 0.9 % 100 mL IVPB  Status:  Discontinued     2 g 200 mL/hr over 30 Minutes Intravenous  Once 06/25/18 2306 06/25/18 2341   06/25/18 2315  metroNIDAZOLE (FLAGYL) IVPB 500 mg  Status:  Discontinued     500 mg 100 mL/hr over 60 Minutes Intravenous Every 8 hours 06/25/18 2306 06/25/18 2341     Assessment/Plan CAD/ischemic cardiomyopathy/Hx VT/AICD - Coreg Hx systolic congestive heart failure (EF 50%, grade 1 diastolic dysfunction) - Demadex/Spironolactone Hypertension Hyperlipidemia GERD Gout Hx diverticulosis Chronic kidney dz (baseline creatinine 1.7 -2.15)1.92 >>  2.11 Chronic anemia Malnutrition - moderate Deconditioning Diabetes - Hemoglobin A1C  - 7.3  Abdominal wall hernia with perforated viscus Exploratory laparotomy, resection of small bowel x2, lysis of adhesion x90 minutes, 06/26/2018, Dr. [name]  FEN: NPO/IV fluids  ID:  Zosyn 10/21 =>>  Day 3 DVT:  SCD Foley:  In  Follow up:  Dr. Dema Severin   Plan: Continue IV antibiotics, fluids per Medicine/cardiology.  Continue Foley today.  Get him out of bed today some.  He needs the binder, especially when he is out of bed.  We will add DVT prophylaxis tonight.      LOS: 2 days    [name] 06/27/2018 [phone]

## 2018-06-27 NOTE — Progress Notes (Signed)
PROGRESS NOTE    [name]  GEX:[id number] DOB: Oct 04, 1926 DOA: 06/25/2018 PCP: [name], MD  Brief Narrative: The patient is an 84 year old male with history of CAD status post CABG, ischemic cardiomyopathy status post ICD placement, history of paroxysmal ventricular tachycardia who underwent a right upper quadrant abdominal pain that started yesterday afternoon.  CT abdomen showed a ventral hernia containing a perforated viscus with likely ischemic bowel and general surgery was consulted and admitted the patient to general surgery and medicine was consulted to help manage medical issues.  Cardiology did preoperative clearance and patient underwent surgery last night.  Assessment & Plan:   Principal Problem:   Ventral incisional hernia Active Problems:   Chronic systolic heart failure (HCC)   ICD (implantable cardioverter-defibrillator) in place   CORONARY ARTERY BYPASS GRAFT, HX OF   DM type 2 causing vascular disease ([place])   Preoperative cardiovascular examination   Ischemic cardiomyopathy   Anemia of chronic disease   Hypertension   A-fib (HCC)   Small bowel ischemia (HCC)   Thrombocytopenia (HCC)  Ventral hernia with perforated viscus and possible ischemic bowel postoperative day 1 with exploratory laparotomy, resection of small bowel and lysis of adhesions -Continue n.p.o. and NG tube -Per general surgery  -Continue with empiric antibodies with IV Zosyn -Pain control per general surgery -Since patient is not eating I placed the patient on D5W normal saline at rate of 50 mils per hour for 20 hours and then reevaluate  CAD status post CABG -Currently denies any chest pain -Cardiology was consulted for preoperative clearance  History of ischemic cardiomyopathy with a EF of 35% status post AICD placement -Cardiology consulted for preoperative cardiovascular clearance -Per cardiology continue beta-blocker and this was changed to IV metoprolol given that he is  n.p.o. for Carvedilol -AICD was managed with a magnet prior to surgery -Cardiology check an EKG -Patient was evaluated by heart failure Dr. Dr. Aundra Dubin and spironolactone was added  -Cardiology was a moderate risk for intermediate surgery given his underlying comorbidities and had no contraindication for planned surgery -Gently fluid hydrate as patient is n.p.o. D5W normal saline at rate of 50 mils per hour with close monitoring of volume status -Admission patient is +3.045 L and will need strict I's and O's and daily weights -BNP was elevated at 433.0 however currently remains euvolemic  CKD stage III/IV -Creatinine is not far from baseline -Avoid nephrotoxic medications possible -Gentle maintenance IV fluid hydration with D5W normal saline at 50 mL's per hour for 20 hours and reevaluate fluid status in a.m. -Continue monitor and trend renal function repeat CMP in a.m.  Anemia of Chronic Kidney Disease/Normocytic Anemia/Acute Blood Loss Anemia -Patient's hemoglobin/hematocrit went from 12.0/36.3 and is now 9.0/27.7 -In the setting of general surgery and possible also dilutional drop given fluid resuscitation -Continue monitor for signs and symptoms of bleeding-repeat CBC in a.m. -Transfuse if hemoglobin drops less than 7  Thrombocytopenia -Patient's platelet count dropped from 147 is now 73 -Continue monitor for signs of bleeding -Repeat CBC in a.m.  Diabetes mellitus type 2 -Hemoglobin A1c was 7.3 -This on sensitive NovoLog sliding scale insulin before meals -Continue monitor CBGs; blood glucose has been ranging from 153-200 -We will consult diabetes coronary for further evaluation recommendations  DVT prophylaxis: Per General Surgery; SCDs and chemical DVT prophylaxis starting today Code Status: FULL CODE Family Communication: Discussed with Family at bedside Disposition Plan: Per General Surgery  Consultants:   [facility]   General Surgery  Cardiology   Procedures:  Done by  Dr. [name] and Dr. Excell Seltzer 1. Exploratory laparotomy 2. Resection of small bowel x2 3. Lysis of adhesions x 90 minutes   Antimicrobials:  Anti-infectives (From admission, onward)   Start     Dose/Rate Route Frequency Ordered Stop   06/26/18 1400  piperacillin-tazobactam (ZOSYN) IVPB 3.375 g     3.375 g 12.5 mL/hr over 240 Minutes Intravenous Every 8 hours 06/26/18 1218     06/26/18 0815  cefoTEtan (CEFOTAN) 2 g in sodium chloride 0.9 % 100 mL IVPB     2 g 200 mL/hr over 30 Minutes Intravenous On call to O.R. 06/26/18 0809 06/26/18 1103   06/26/18 0600  piperacillin-tazobactam (ZOSYN) IVPB 2.25 g  Status:  Discontinued     2.25 g 100 mL/hr over 30 Minutes Intravenous Every 6 hours 06/26/18 0447 06/26/18 1217   06/25/18 2345  piperacillin-tazobactam (ZOSYN) IVPB 3.375 g     3.375 g 100 mL/hr over 30 Minutes Intravenous  Once 06/25/18 2341 06/26/18 0031   06/25/18 2315  ceFEPIme (MAXIPIME) 2 g in sodium chloride 0.9 % 100 mL IVPB  Status:  Discontinued     2 g 200 mL/hr over 30 Minutes Intravenous  Once 06/25/18 2306 06/25/18 2341   06/25/18 2315  metroNIDAZOLE (FLAGYL) IVPB 500 mg  Status:  Discontinued     500 mg 100 mL/hr over 60 Minutes Intravenous Every 8 hours 06/25/18 2306 06/25/18 2341     Subjective: Seen and examined at bedside and states his abdomen was sore.  No chest pain, lightheadedness or dizziness.  Felt okay this morning.  No other concerns or complaints at this time  Objective: Vitals:   06/27/18 0600 06/27/18 0700 06/27/18 0800 06/27/18 0845  BP: (!) 100/36 (!) 104/43 (!) 104/42   Pulse: 61 61 61   Resp: (!) 23 16 20    Temp:    97.9 F (36.6 C)  TempSrc:    Oral  SpO2: 93% 94% 93%   Weight:      Height:        Intake/Output Summary (Last 24 hours) at 06/27/2018 1147 Last data filed at 06/27/2018 1012 Gross per 24 hour  Intake 3126.8 ml  Output 915 ml  Net 2211.8 ml   Filed Weights   06/25/18 2117 06/26/18 0130 06/26/18 0902    Weight: 68.9 kg 68.2 kg 68.2 kg   Examination: Physical Exam:  Constitutional: Elderly AAM in NAD and appears calm but uncomfortable Eyes: Lids and conjunctivae normal, sclerae anicteric  ENMT: External Ears, Nose appear normal. Grossly normal hearing. NGT in Place Neck: Appears normal, supple, no cervical masses, normal ROM, no appreciable thyromegaly; no JVD Respiratory: Diminished to auscultation bilaterally, no wheezing, rales, rhonchi or crackles. Normal respiratory effort and patient is not tachypenic. No accessory muscle use.  Cardiovascular: RRR but on the slower side, no murmurs / rubs / gallops. S1 and S2 auscultated. Trace extremity edema.  Abdomen: Soft, Had abdominal Binder on. Bowel sounds diminished.  GU: Deferred. Has a foley in place Musculoskeletal: No clubbing / cyanosis of digits/nails. No joint deformity upper and lower extremities. Skin: No rashes, lesions, ulcers on a limited skin eval. No induration; Warm and dry.  Neurologic: CN 2-12 grossly intact with no focal deficits.  Romberg sign and cerebellar reflexes not assessed.  Psychiatric: Normal judgment and insight. Alert and oriented x 3. Normal mood and appropriate affect.   Data Reviewed: I have personally reviewed following labs and imaging studies  CBC: Recent Labs  Lab 06/25/18  2117 06/27/18 0557  WBC 9.0 5.8  HGB 12.0* 9.0*  HCT 36.3* 27.7*  MCV 94.0 97.5  PLT 147* 73*   Basic Metabolic Panel: Recent Labs  Lab 06/25/18 2117 06/26/18 0809 06/27/18 0557  NA 133* 134* 136  K 4.3 4.4 4.0  CL 96* 98 103  CO2 25 25 22   GLUCOSE 200* 181* 162*  BUN 66* 65* 52*  CREATININE 2.23* 2.10* 2.11*  CALCIUM 9.4 8.9 8.2*  MG  --  2.1  --    GFR: Estimated Creatinine Clearance: 19.9 mL/min (A) (by C-G formula based on SCr of 2.11 mg/dL (H)). Liver Function Tests: Recent Labs  Lab 06/25/18 2117  AST 19  ALT 11  ALKPHOS 96  BILITOT 1.2  PROT 7.6  ALBUMIN 4.1   No results for input(s): LIPASE,  AMYLASE in the last 168 hours. No results for input(s): AMMONIA in the last 168 hours. Coagulation Profile: Recent Labs  Lab 06/26/18 0809  INR 0.94   Cardiac Enzymes: Recent Labs  Lab 06/26/18 0809  TROPONINI 0.04*   BNP (last 3 results) No results for input(s): PROBNP in the last 8760 hours. HbA1C: Recent Labs    06/27/18 0558  HGBA1C 7.3*   CBG: No results for input(s): GLUCAP in the last 168 hours. Lipid Profile: No results for input(s): CHOL, HDL, LDLCALC, TRIG, CHOLHDL, LDLDIRECT in the last 72 hours. Thyroid Function Tests: No results for input(s): TSH, T4TOTAL, FREET4, T3FREE, THYROIDAB in the last 72 hours. Anemia Panel: No results for input(s): VITAMINB12, FOLATE, FERRITIN, TIBC, IRON, RETICCTPCT in the last 72 hours. Sepsis Labs: No results for input(s): PROCALCITON, LATICACIDVEN in the last 168 hours.  Recent Results (from the past 240 hour(s))  MRSA PCR Screening     Status: None   Collection Time: 06/26/18  1:48 AM  Result Value Ref Range Status   MRSA by PCR NEGATIVE NEGATIVE Final    Comment:        The GeneXpert MRSA Assay (FDA approved for NASAL specimens only), is one component of a comprehensive MRSA colonization surveillance program. It is not intended to diagnose MRSA infection nor to guide or monitor treatment for MRSA infections. Performed at [facility], [place] [address], [place], [place] [address]     Radiology Studies: Ct Abdomen Pelvis Wo Contrast  Result Date: 06/25/2018 CLINICAL DATA:  Acute abdominal pain EXAM: CT ABDOMEN AND PELVIS WITHOUT CONTRAST TECHNIQUE: Multidetector CT imaging of the abdomen and pelvis was performed following the standard protocol without IV contrast. COMPARISON:  CT abdomen pelvis 10/31/2014 FINDINGS: LOWER CHEST: Moderate cardiomegaly.  No pleural effusion. HEPATOBILIARY: The hepatic contours and density are normal. There is no intra- or extrahepatic biliary dilatation. The gallbladder  is normal. There is a 5 x 5 mm stone within the distal common bile duct. PANCREAS: The pancreatic parenchymal contours are normal and there is no ductal dilatation. There is no peripancreatic fluid collection. SPLEEN: Normal. ADRENALS/URINARY TRACT: --Adrenal glands: Normal. --Right kidney/ureter: No hydronephrosis, nephroureterolithiasis, perinephric stranding or solid renal mass. --Left kidney/ureter: No hydronephrosis, nephroureterolithiasis, perinephric stranding or solid renal mass. --Urinary bladder: Normal for degree of distention STOMACH/BOWEL: --Stomach/Duodenum: There is no hiatal hernia or other gastric abnormality. The duodenal course and caliber are normal. --Small bowel: There is a right lower quadrant ventral abdominal hernia that contains portions of small bowel, including a segment with an anastomotic junction and fecalized contents. There is extraluminal gas surrounding the segment, some of which is probably intramural (pneumatosis) and some of which  appears to be intraperitoneal. There is moderate surrounding inflammatory change. --Colon: No focal abnormality. --Appendix: Normal. VASCULAR/LYMPHATIC: Atherosclerotic calcification is present within the non-aneurysmal abdominal aorta, without hemodynamically significant stenosis. No abdominal or pelvic lymphadenopathy. REPRODUCTIVE: Enlarged prostate measures 5 cm in transverse dimension. MUSCULOSKELETAL. Grade 1 anterolisthesis at L4-5 due to facet arthrosis. Large Schmorl's node at superior endplate of O03. OTHER: Right inguinal hernia contains a loop of nondilated small bowel. IMPRESSION: 1. Right ventral abdominal hernia containing loops of small bowel, with suspected pneumatosis and likely perforation at the site of prior anastomosis. This likely indicates a short segment of ischemic bowel. 2. 5 x 5 mm stone in the distal common bile duct without biliary dilatation. 3. Right inguinal hernia containing loop of nondilated small bowel. 4.  Aortic  Atherosclerosis (ICD10-I70.0). Critical Value/emergent results were called by telephone at the time of interpretation on 06/25/2018 at 110:56 PM to Dr. Lajean Saver , who verbally acknowledged these results. Electronically Signed   By: Ulyses Jarred M.D.   On: 06/25/2018 23:06   X-ray Chest Pa Or Ap  Result Date: 06/26/2018 CLINICAL DATA:  Postop central line placement EXAM: CHEST  1 VIEW COMPARISON:  Chest x-ray of 08/25/2017 FINDINGS: Right central venous line is present with tip overlying the expected right atrium. No pneumothorax is seen. No pneumonia or effusion is noted. There is mild left basilar atelectasis present. Cardiomegaly is stable. Pacer and AICD leads are noted. NG tube extends into the stomach. IMPRESSION: 1. Right IJ central venous line tip overlies expected right atrium. No pneumothorax. 2. Stable cardiomegaly with mild basilar volume loss. 3. No change in pacer and AICD leads. Electronically Signed   By: Ivar Drape M.D.   On: 06/26/2018 14:48   Scheduled Meds: . chlorhexidine  15 mL Mouth Rinse BID  . heparin injection (subcutaneous)  5,000 Units Subcutaneous Q8H  . insulin aspart  0-9 Units Subcutaneous TID WC  . mouth rinse  15 mL Mouth Rinse q12n4p  . metoprolol tartrate  2.5 mg Intravenous Q6H   Continuous Infusions: . sodium chloride 500 mL (06/26/18 2258)  . acetaminophen    . dextrose 5 % and 0.9% NaCl 50 mL/hr at 06/27/18 1012  . famotidine (PEPCID) IV Stopped (06/26/18 2234)  . piperacillin-tazobactam (ZOSYN)  IV Stopped (06/27/18 7048)    LOS: 2 days   Kerney Elbe, DO [facility] PAGER is on AMION  If 7PM-7AM, please contact night-coverage [email or web address] Password [facility] 06/27/2018, 11:47 AM

## 2018-06-27 NOTE — Care Management Note (Signed)
Case Management Note  Patient Details  Name: [name] MRN: [id number] Date of Birth: 1927-06-02  Subjective/Objective:                  S/p repair of viscus perforation and abd wall hernia,  A.line in place 02 [place]/bun 52, creat 2.11/ iv flds, iv acetaminophen drip,iv pepcid drip, iv zosyn.  Action/Plan: Will follow for progression of care and clinical status. Will follow for case management needs none present at this time.  Expected Discharge Date:                  Expected Discharge Plan:  Home/Self Care  In-House Referral:     Discharge planning Services  CM Consult  Post Acute Care Choice:    Choice offered to:     DME Arranged:    DME Agency:     HH Arranged:    HH Agency:     Status of Service:  In process, will continue to follow  If discussed at Long Length of Stay Meetings, dates discussed:    Additional Comments:  Leeroy Cha, RN 06/27/2018, 8:59 AM

## 2018-06-27 NOTE — Progress Notes (Signed)
2030: SBP of 84 correlation with Art Line 500 ml bolus   0000 Held Metoprolol SBP 90  0220 SBP Soft 80's correlated with Art line, 250 ml bolus.   0600 Held Metoprolol, SBP low 90's.   Patient alert and oriented x 4, no change mentation, UOP 525, NG output 120.

## 2018-06-27 NOTE — Addendum Note (Signed)
Addendum  created 06/27/18 0705 by Lollie Sails, CRNA   Charge Capture section accepted

## 2018-06-28 ENCOUNTER — Encounter (HOSPITAL_COMMUNITY): Payer: Self-pay | Admitting: Rehabilitative and Restorative Service Providers"

## 2018-06-28 DIAGNOSIS — E876 Hypokalemia: Secondary | ICD-10-CM

## 2018-06-28 LAB — CBC WITH DIFFERENTIAL/PLATELET
Abs Immature Granulocytes: 0.08 10*3/uL — ABNORMAL HIGH (ref 0.00–0.07)
Basophils Absolute: 0 10*3/uL (ref 0.0–0.1)
Basophils Relative: 0 %
EOS ABS: 0.1 10*3/uL (ref 0.0–0.5)
Eosinophils Relative: 2 %
HEMATOCRIT: 25.4 % — AB (ref 39.0–52.0)
HEMOGLOBIN: 8.3 g/dL — AB (ref 13.0–17.0)
Immature Granulocytes: 1 %
LYMPHS ABS: 0.4 10*3/uL — AB (ref 0.7–4.0)
Lymphocytes Relative: 6 %
MCH: 31.1 pg (ref 26.0–34.0)
MCHC: 32.7 g/dL (ref 30.0–36.0)
MCV: 95.1 fL (ref 80.0–100.0)
MONO ABS: 0.5 10*3/uL (ref 0.1–1.0)
MONOS PCT: 8 %
Neutro Abs: 5.1 10*3/uL (ref 1.7–7.7)
Neutrophils Relative %: 83 %
Platelets: 71 10*3/uL — ABNORMAL LOW (ref 150–400)
RBC: 2.67 MIL/uL — ABNORMAL LOW (ref 4.22–5.81)
RDW: 15.1 % (ref 11.5–15.5)
WBC: 6.1 10*3/uL (ref 4.0–10.5)
nRBC: 0 % (ref 0.0–0.2)

## 2018-06-28 LAB — COMPREHENSIVE METABOLIC PANEL
ALK PHOS: 44 U/L (ref 38–126)
ALT: 11 U/L (ref 0–44)
AST: 20 U/L (ref 15–41)
Albumin: 2.6 g/dL — ABNORMAL LOW (ref 3.5–5.0)
Anion gap: 9 (ref 5–15)
BILIRUBIN TOTAL: 0.9 mg/dL (ref 0.3–1.2)
BUN: 45 mg/dL — ABNORMAL HIGH (ref 8–23)
CALCIUM: 8.5 mg/dL — AB (ref 8.9–10.3)
CO2: 23 mmol/L (ref 22–32)
CREATININE: 2.12 mg/dL — AB (ref 0.61–1.24)
Chloride: 109 mmol/L (ref 98–111)
GFR calc non Af Amer: 26 mL/min — ABNORMAL LOW (ref >60)
GFR, EST AFRICAN AMERICAN: 30 mL/min — AB (ref >60)
Glucose, Bld: 179 mg/dL — ABNORMAL HIGH (ref 70–99)
Potassium: 3.3 mmol/L — ABNORMAL LOW (ref 3.5–5.1)
SODIUM: 141 mmol/L (ref 135–145)
Total Protein: 5.7 g/dL — ABNORMAL LOW (ref 6.5–8.1)

## 2018-06-28 LAB — PHOSPHORUS: PHOSPHORUS: 2.7 mg/dL (ref 2.5–4.6)

## 2018-06-28 LAB — GLUCOSE, CAPILLARY
GLUCOSE-CAPILLARY: 146 mg/dL — AB (ref 70–99)
Glucose-Capillary: 162 mg/dL — ABNORMAL HIGH (ref 70–99)
Glucose-Capillary: 150 mg/dL — ABNORMAL HIGH (ref 70–99)
Glucose-Capillary: 152 mg/dL — ABNORMAL HIGH (ref 70–99)

## 2018-06-28 LAB — MAGNESIUM: Magnesium: 2 mg/dL (ref 1.7–2.4)

## 2018-06-28 MED ORDER — POTASSIUM CHLORIDE 10 MEQ/100ML IV SOLN
10.0000 meq | INTRAVENOUS | Status: AC
  Administered 2018-06-28 (×4): 10 meq via INTRAVENOUS
  Filled 2018-06-28 (×4): qty 100

## 2018-06-28 MED ORDER — DEXTROSE-NACL 5-0.9 % IV SOLN
INTRAVENOUS | Status: DC
  Administered 2018-06-28: 18:00:00 via INTRAVENOUS

## 2018-06-28 MED ORDER — ACETAMINOPHEN 10 MG/ML IV SOLN
1000.0000 mg | Freq: Four times a day (QID) | INTRAVENOUS | Status: AC
  Administered 2018-06-28 – 2018-06-29 (×4): 1000 mg via INTRAVENOUS
  Filled 2018-06-28 (×4): qty 100

## 2018-06-28 MED ORDER — METHOCARBAMOL 1000 MG/10ML IJ SOLN
500.0000 mg | Freq: Three times a day (TID) | INTRAVENOUS | Status: DC
  Administered 2018-06-28: 500 mg via INTRAVENOUS
  Filled 2018-06-28: qty 500
  Filled 2018-06-28 (×2): qty 5

## 2018-06-28 NOTE — Progress Notes (Signed)
2 Days Post-Op    CC:  Abdominal pain  Subjective: He is pretty confused this AM, but cooperative.  A line is flat, He isn't doing very well with IS.  NO BS, Prevena is in place.  NG isn't draining much.    Objective: Vital signs in last 24 hours: Temp:  [97.6 F (36.4 C)-98.5 F (36.9 C)] 97.7 F (36.5 C) (10/24 0324) Pulse Rate:  [55-97] 55 (10/24 0600) Resp:  [12-22] 20 (10/24 0600) BP: (104-133)/(39-70) 109/50 (10/24 0600) SpO2:  [86 %-98 %] 90 % (10/24 0600) Weight:  [72.2 kg] 72.2 kg (10/24 0600) Last BM Date: 06/25/18  1158 IV 925 urine 530 NG Afebrile, VSS Creatinine is stable at 2.12 K+ 3.3/Mag 2.0 WBC 6.1 H/H 8.3/25.4 Platelets still down 71K  Intake/Output from previous day: 10/23 0701 - 10/24 0700 In: 1158.3 [I.V.:840; IV Piggyback:318.3] Out: 1455 [Urine:925; Emesis/NG output:530] Intake/Output this shift: No intake/output data recorded.  General appearance: he is awake, cooperative, but very confused. Resp: clear to auscultation bilaterally and anterior exam Cardio: paced rhythm  GI: No BS, NG working, Prevena wound vac in place.   Lab Results:  Recent Labs    06/27/18 0557 06/28/18 0547  WBC 5.8 6.1  HGB 9.0* 8.3*  HCT 27.7* 25.4*  PLT 73* 71*    BMET Recent Labs    06/27/18 0557 06/28/18 0547  NA 136 141  K 4.0 3.3*  CL 103 109  CO2 22 23  GLUCOSE 162* 179*  BUN 52* 45*  CREATININE 2.11* 2.12*  CALCIUM 8.2* 8.5*   PT/INR Recent Labs    06/26/18 0809  LABPROT 12.5  INR 0.94    Recent Labs  Lab 06/25/18 2117 06/28/18 0547  AST 19 20  ALT 11 11  ALKPHOS 96 44  BILITOT 1.2 0.9  PROT 7.6 5.7*  ALBUMIN 4.1 2.6*     Lipase     Component Value Date/Time   LIPASE 31 10/31/2014 0230     Medications: . chlorhexidine  15 mL Mouth Rinse BID  . Chlorhexidine Gluconate Cloth  6 each Topical Daily  . insulin aspart  0-9 Units Subcutaneous TID WC  . mouth rinse  15 mL Mouth Rinse q12n4p  . metoprolol tartrate  2.5 mg  Intravenous Q6H  . sodium chloride flush  10-40 mL Intracatheter Q12H   . sodium chloride 500 mL (06/26/18 2258)  . acetaminophen 1,000 mg (06/28/18 0558)  . dextrose 5 % and 0.9% NaCl 50 mL/hr at 06/28/18 0300  . famotidine (PEPCID) IV Stopped (06/27/18 2313)  . piperacillin-tazobactam (ZOSYN)  IV 3.375 g (06/28/18 0553)  . potassium chloride     Anti-infectives (From admission, onward)   Start     Dose/Rate Route Frequency Ordered Stop   06/26/18 1400  piperacillin-tazobactam (ZOSYN) IVPB 3.375 g     3.375 g 12.5 mL/hr over 240 Minutes Intravenous Every 8 hours 06/26/18 1218     06/26/18 0815  cefoTEtan (CEFOTAN) 2 g in sodium chloride 0.9 % 100 mL IVPB     2 g 200 mL/hr over 30 Minutes Intravenous On call to O.R. 06/26/18 0809 06/26/18 1103   06/26/18 0600  piperacillin-tazobactam (ZOSYN) IVPB 2.25 g  Status:  Discontinued     2.25 g 100 mL/hr over 30 Minutes Intravenous Every 6 hours 06/26/18 0447 06/26/18 1217   06/25/18 2345  piperacillin-tazobactam (ZOSYN) IVPB 3.375 g     3.375 g 100 mL/hr over 30 Minutes Intravenous  Once 06/25/18 2341 06/26/18 0031   06/25/18  2315  ceFEPIme (MAXIPIME) 2 g in sodium chloride 0.9 % 100 mL IVPB  Status:  Discontinued     2 g 200 mL/hr over 30 Minutes Intravenous  Once 06/25/18 2306 06/25/18 2341   06/25/18 2315  metroNIDAZOLE (FLAGYL) IVPB 500 mg  Status:  Discontinued     500 mg 100 mL/hr over 60 Minutes Intravenous Every 8 hours 06/25/18 2306 06/25/18 2341      Assessment/Plan CAD/ischemic cardiomyopathy/Hx VT/AICD- Coreg Hx systolic congestive heart failure(EF 17%, grade 1 diastolic dysfunction) - Demadex/Spironolactone Hypertension Hyperlipidemia GERD Gout Hx diverticulosis Chronic kidney dz (baseline creatinine 1.7 -2.15)1.92 >> 2.11 Chronic anemia Thrombocytopenia  147 >> 73 >> 71 Malnutrition - moderate Deconditioning Diabetes - Hemoglobin A1C  - 7.3  Abdominal wall hernia with perforated viscus Exploratory  laparotomy, resection of small bowel x2, lysis of adhesion x90 minutes, 06/26/2018, Dr. [name]  FEN: NPO/IV fluids  ID: Zosyn 10/21 =>>Day 4 DVT: SCD - No starting heparin due to low platelets Foley: In  Follow up: Dr. Dema Severin  Plan:  I ask family to open the blinds and let the sun light in to help get his days and nights straight.  I would consider transfusing with his cardiac issues, but will defer to Medicine.  We have not started heparin post op for DVT prophylaxis due to his low platelet count.  K+ is being replaced.  Continue antibiotics, DC Aline.  Renew IV Tylenol, add robaxin  Pain control: IV tylenol, dilaudid 0.5 mg x 1 yesterday, 1944 hrs,  No lopressor  LOS: 3 days    [name] 06/28/2018 [phone]

## 2018-06-28 NOTE — Progress Notes (Signed)
PROGRESS NOTE    [name]  WFU:[id number] DOB: Jan 20, 1927 DOA: 06/25/2018 PCP: [name], MD  Brief Narrative: The patient is an 84 year old male with history of CAD status post CABG, ischemic cardiomyopathy status post ICD placement, history of paroxysmal ventricular tachycardia who underwent a right upper quadrant abdominal pain that started yesterday afternoon.  CT abdomen showed a ventral hernia containing a perforated viscus with likely ischemic bowel and general surgery was consulted and admitted the patient to general surgery and medicine was consulted to help manage medical issues.  Cardiology did preoperative clearance and patient underwent surgery on 06/26/18.  Assessment & Plan:   Principal Problem:   Ventral incisional hernia Active Problems:   Chronic systolic heart failure ([place])   ICD (implantable cardioverter-defibrillator) in place   CORONARY ARTERY BYPASS GRAFT, HX OF   DM type 2 causing vascular disease ([place])   Preoperative cardiovascular examination   Ischemic cardiomyopathy   Anemia of chronic disease   Hypertension   A-fib (HCC)   Small bowel ischemia (HCC)   Thrombocytopenia (HCC)  Ventral hernia with perforated viscus and possible ischemic bowel postoperative day 1 with exploratory laparotomy, resection of small bowel and lysis of adhesions -Continue n.p.o. With Ice Chips and NG tube until bowel function returns and is reliable  -Per General Surgery  -Continue with Empiric antibodies with IV Zosyn -Pain control per general surgery -Since patient is not eating he was placed the patient on D5W normal saline at rate of 50 mils per hour for 20 hours again  CAD status post CABG -Currently denies any chest pain -Cardiology was consulted for preoperative clearance  History of ischemic cardiomyopathy with a EF of 35% status post AICD placement -Cardiology consulted for preoperative cardiovascular clearance -Per cardiology continue beta-blocker and  this was changed to IV metoprolol given that he is n.p.o. for Carvedilol -AICD was managed with a magnet prior to surgery -Cardiology checked an EKG -Patient was evaluated by heart failure Dr. Dr. Aundra Dubin and spironolactone was added  -Cardiology was a moderate risk for intermediate surgery given his underlying comorbidities and had no contraindication for planned surgery -Gently fluid hydration as patient is n.p.o. with D5W normal saline at rate of 50 mils per hour with close monitoring of volume status and will continue today  -Since Admission patient is +3.736 L and will need strict I's and O's and daily weights -BNP was elevated at 433.0 however currently remains euvolemic  CKD stage III/IV -Creatinine is not far from baseline -BUN/Cr is now 45/2.12 -Avoid nephrotoxic medications possible -Gentle maintenance IV fluid hydration again with D5W normal saline at 50 mL's per hour for 20 hours and reevaluate fluid status in a.m. -Continue monitor and trend renal function repeat CMP in a.m.  Anemia of Chronic Kidney Disease/Normocytic Anemia/Acute Blood Loss Anemia -Patient's hemoglobin/hematocrit went from 12.0/36.3 and is now 8.3/28.4 -In the setting of Surgical Intervention and possible also dilutional drop given fluid resuscitation -Continue monitor for signs and symptoms of bleeding-repeat CBC in a.m. -Transfuse if hemoglobin drops less than 7 normally but may need to transfuse sooner given Heart Failure Hx. If below 8 tomorrow will likely give 1 unit of pRBC's with a dose of IV Lasix to follow but continue to Monitor for S/Sx of Bleeding   Thrombocytopenia -Patient's platelet count dropped from 147 is now 71 -Continue monitor for signs of bleeding -Repeat CBC in a.m.  Diabetes mellitus type 2 -Hemoglobin A1c was 7.3 -This on sensitive NovoLog sliding scale insulin before meals -Continue  monitor CBGs; blood glucose has been ranging from 168-197 -We will consult diabetes coronary for  further evaluation recommendations  Hypokalemia -Patient's potassium this morning was 3.3 -Replete with IV potassium chloride 40 mEQ -Continue monitor replete as necessary -Repeat CMP in a.m.  DVT prophylaxis: Per General Surgery; SCDs and chemical DVT prophylaxis starting today Code Status: FULL CODE Family Communication: Discussed with Family at bedside Disposition Plan: Per General Surgery; Have Consulted Social worker for SNF placement  Consultants:   [facility]   General Surgery  Cardiology   Procedures: Done by Dr. [name] and Dr. Excell Seltzer 1. Exploratory laparotomy 2. Resection of small bowel x2 3. Lysis of adhesions x 90 minutes   Antimicrobials:  Anti-infectives (From admission, onward)   Start     Dose/Rate Route Frequency Ordered Stop   06/26/18 1400  piperacillin-tazobactam (ZOSYN) IVPB 3.375 g     3.375 g 12.5 mL/hr over 240 Minutes Intravenous Every 8 hours 06/26/18 1218     06/26/18 0815  cefoTEtan (CEFOTAN) 2 g in sodium chloride 0.9 % 100 mL IVPB     2 g 200 mL/hr over 30 Minutes Intravenous On call to O.R. 06/26/18 0809 06/26/18 1103   06/26/18 0600  piperacillin-tazobactam (ZOSYN) IVPB 2.25 g  Status:  Discontinued     2.25 g 100 mL/hr over 30 Minutes Intravenous Every 6 hours 06/26/18 0447 06/26/18 1217   06/25/18 2345  piperacillin-tazobactam (ZOSYN) IVPB 3.375 g     3.375 g 100 mL/hr over 30 Minutes Intravenous  Once 06/25/18 2341 06/26/18 0031   06/25/18 2315  ceFEPIme (MAXIPIME) 2 g in sodium chloride 0.9 % 100 mL IVPB  Status:  Discontinued     2 g 200 mL/hr over 30 Minutes Intravenous  Once 06/25/18 2306 06/25/18 2341   06/25/18 2315  metroNIDAZOLE (FLAGYL) IVPB 500 mg  Status:  Discontinued     500 mg 100 mL/hr over 60 Minutes Intravenous Every 8 hours 06/25/18 2306 06/25/18 2341     Subjective: Seen and examined at bedside was doing okay.  Still has some soreness.  Not passing gas yet and does not have a bowel movement.  No  nausea or vomiting.  Denies any chest pain at this time.  No other concerns or complaints at this time  Objective: Vitals:   06/28/18 0324 06/28/18 0400 06/28/18 0500 06/28/18 0600  BP:  133/70 (!) 116/58 (!) 109/50  Pulse:  92 66 (!) 55  Resp:  (!) 21 (!) 21 20  Temp: 97.7 F (36.5 C)     TempSrc: Oral     SpO2:  95% 96% 90%  Weight:    72.2 kg  Height:        Intake/Output Summary (Last 24 hours) at 06/28/2018 0741 Last data filed at 06/28/2018 0600 Gross per 24 hour  Intake 1158.26 ml  Output 1455 ml  Net -296.74 ml   Filed Weights   06/26/18 0130 06/26/18 0902 06/28/18 0600  Weight: 68.2 kg 68.2 kg 72.2 kg   Examination: Physical Exam:  Constitutional: Patient is an elderly African-American male currently no acute distress appears calm but slightly uncomfortable laying in bed Eyes: Sclera anicteric.  Lids and conjunctive normal ENMT: External ears and nose appear normal.  Grossly normal hearing.  NG tube in place Neck: Appears supple no JVD Respiratory: Diminished to auscultation bilaterally no appreciable wheezing, rales, rhonchi.  Unlabored breathing is not using accessory muscle breathe Cardiovascular: Regular rate and rhythm.  No appreciable murmurs, rubs or gallops.  Has  mild lower extremity edema Abdomen: Soft, has an abdominal binder on.  Bowel sounds are diminished GU: Deferred.   Musculoskeletal: No contractures or cyanosis.  No joint were noted Skin: No appreciable rashes or lesions limited skin evaluation.  Has a wound VAC on abdominal incision. Neurologic: No nerves II through XII grossly intact no appreciable focal deficits.  Romberg sign and cerebellar function assessed Psychiatric: Normal judgment and insight.  Patient is awake, alert and oriented x3.  Normal mood and affect.  Data Reviewed: I have personally reviewed following labs and imaging studies  CBC: Recent Labs  Lab 06/25/18 2117 06/27/18 0557 06/28/18 0547  WBC 9.0 5.8 6.1  NEUTROABS   --   --  5.1  HGB 12.0* 9.0* 8.3*  HCT 36.3* 27.7* 25.4*  MCV 94.0 97.5 95.1  PLT 147* 73* 71*   Basic Metabolic Panel: Recent Labs  Lab 06/25/18 2117 06/26/18 0809 06/27/18 0557 06/28/18 0547  NA 133* 134* 136 141  K 4.3 4.4 4.0 3.3*  CL 96* 98 103 109  CO2 25 25 22 23   GLUCOSE 200* 181* 162* 179*  BUN 66* 65* 52* 45*  CREATININE 2.23* 2.10* 2.11* 2.12*  CALCIUM 9.4 8.9 8.2* 8.5*  MG  --  2.1  --  2.0  PHOS  --   --   --  2.7   GFR: Estimated Creatinine Clearance: 21.5 mL/min (A) (by C-G formula based on SCr of 2.12 mg/dL (H)). Liver Function Tests: Recent Labs  Lab 06/25/18 2117 06/28/18 0547  AST 19 20  ALT 11 11  ALKPHOS 96 44  BILITOT 1.2 0.9  PROT 7.6 5.7*  ALBUMIN 4.1 2.6*   No results for input(s): LIPASE, AMYLASE in the last 168 hours. No results for input(s): AMMONIA in the last 168 hours. Coagulation Profile: Recent Labs  Lab 06/26/18 0809  INR 0.94   Cardiac Enzymes: Recent Labs  Lab 06/26/18 0809  TROPONINI 0.04*   BNP (last 3 results) No results for input(s): PROBNP in the last 8760 hours. HbA1C: Recent Labs    06/27/18 0558  HGBA1C 7.3*   CBG: Recent Labs  Lab 06/27/18 1657 06/27/18 2134  GLUCAP 197* 168*   Lipid Profile: No results for input(s): CHOL, HDL, LDLCALC, TRIG, CHOLHDL, LDLDIRECT in the last 72 hours. Thyroid Function Tests: No results for input(s): TSH, T4TOTAL, FREET4, T3FREE, THYROIDAB in the last 72 hours. Anemia Panel: No results for input(s): VITAMINB12, FOLATE, FERRITIN, TIBC, IRON, RETICCTPCT in the last 72 hours. Sepsis Labs: No results for input(s): PROCALCITON, LATICACIDVEN in the last 168 hours.  Recent Results (from the past 240 hour(s))  MRSA PCR Screening     Status: None   Collection Time: 06/26/18  1:48 AM  Result Value Ref Range Status   MRSA by PCR NEGATIVE NEGATIVE Final    Comment:        The GeneXpert MRSA Assay (FDA approved for NASAL specimens only), is one component of  a comprehensive MRSA colonization surveillance program. It is not intended to diagnose MRSA infection nor to guide or monitor treatment for MRSA infections. Performed at [facility], [place] [address], [place],  [address]     Radiology Studies: X-ray Chest Pa Or Ap  Result Date: 06/26/2018 CLINICAL DATA:  Postop central line placement EXAM: CHEST  1 VIEW COMPARISON:  Chest x-ray of 08/25/2017 FINDINGS: Right central venous line is present with tip overlying the expected right atrium. No pneumothorax is seen. No pneumonia or effusion is noted. There is  mild left basilar atelectasis present. Cardiomegaly is stable. Pacer and AICD leads are noted. NG tube extends into the stomach. IMPRESSION: 1. Right IJ central venous line tip overlies expected right atrium. No pneumothorax. 2. Stable cardiomegaly with mild basilar volume loss. 3. No change in pacer and AICD leads. Electronically Signed   By: Ivar Drape M.D.   On: 06/26/2018 14:48   Scheduled Meds: . chlorhexidine  15 mL Mouth Rinse BID  . Chlorhexidine Gluconate Cloth  6 each Topical Daily  . insulin aspart  0-9 Units Subcutaneous TID WC  . mouth rinse  15 mL Mouth Rinse q12n4p  . metoprolol tartrate  2.5 mg Intravenous Q6H  . sodium chloride flush  10-40 mL Intracatheter Q12H   Continuous Infusions: . sodium chloride 500 mL (06/26/18 2258)  . acetaminophen 1,000 mg (06/28/18 0558)  . dextrose 5 % and 0.9% NaCl 50 mL/hr at 06/28/18 0300  . famotidine (PEPCID) IV Stopped (06/27/18 2313)  . piperacillin-tazobactam (ZOSYN)  IV 3.375 g (06/28/18 0553)  . potassium chloride      LOS: 3 days   Kerney Elbe, DO [facility] PAGER is on [place]  If 7PM-7AM, please contact night-coverage [email or web address] Password [facility] 06/28/2018, 7:41 AM

## 2018-06-28 NOTE — Evaluation (Signed)
Physical Therapy Evaluation Patient Details Name: [name] MRN: [id number] DOB: 10-04-1926 Today's Date: 06/28/2018   History of Present Illness  Pt s/p exp lap with bowel resection on 06/26/18 and with hx of CAD, CHF, pacemaker and CABG  Clinical Impression  Pt admitted as above and presenting with functional mobility limitations 2* generalized weakness and balance deficits.  Pt would benefit from follow up rehab at SNF level to maximize IND and safety prior to return home with ltd assist.    Follow Up Recommendations SNF    Equipment Recommendations  None recommended by PT    Recommendations for Other Services       Precautions / Restrictions Precautions Precautions: Fall Precaution Comments: wound vac, arterial line present Restrictions Weight Bearing Restrictions: No      Mobility  Bed Mobility Overal bed mobility: Needs Assistance Bed Mobility: Rolling;Sidelying to Sit Rolling: Mod assist;+2 for physical assistance;+2 for safety/equipment Sidelying to sit: Mod assist;+2 for safety/equipment;+2 for physical assistance       General bed mobility comments: cues for sequence with physical assist to complete roll to side ly and transiton to sitting  Transfers Overall transfer level: Needs assistance Equipment used: Rolling walker (2 wheeled) Transfers: Sit to/from Stand Sit to Stand: Min assist;Mod assist;+2 safety/equipment;+2 physical assistance;From elevated surface         General transfer comment: cues for transition position and use of UEs to self assist;  physical assist to bring wt up and fwd and to balance in initial standing  Ambulation/Gait Ambulation/Gait assistance: Min assist;Mod assist;+2 physical assistance;+2 safety/equipment Gait Distance (Feet): 37 Feet Assistive device: Rolling walker (2 wheeled) Gait Pattern/deviations: Step-to pattern;Step-through pattern;Decreased step length - right;Decreased step length - left;Shuffle;Trunk  flexed Gait velocity: decr   General Gait Details: cues for posture and position from RW.  Physical assist for balance, support and RW management  Stairs            Wheelchair Mobility    Modified Rankin (Stroke Patients Only)       Balance Overall balance assessment: Needs assistance Sitting-balance support: Feet supported;Bilateral upper extremity supported Sitting balance-Leahy Scale: Fair     Standing balance support: Bilateral upper extremity supported Standing balance-Leahy Scale: Poor                               Pertinent Vitals/Pain Pain Assessment: No/denies pain    Home Living Family/patient expects to be discharged to:: Private residence Living Arrangements: Alone Available Help at Discharge: Friend(s);Neighbor;Available PRN/intermittently Type of Home: Apartment Home Access: Level entry     Home Layout: One level Home Equipment: Walker - 2 wheels;Walker - 4 wheels;Shower seat;Hand held Veterinary surgeon - single point      Prior Function Level of Independence: Needs assistance   Gait / Transfers Assistance Needed: RW in house and cane outside  ADL's / Homemaking Assistance Needed: meals on wheels        Hand Dominance        Extremity/Trunk Assessment   Upper Extremity Assessment Upper Extremity Assessment: Generalized weakness    Lower Extremity Assessment Lower Extremity Assessment: Generalized weakness       Communication   Communication: No difficulties  Cognition Arousal/Alertness: Awake/alert Behavior During Therapy: WFL for tasks assessed/performed Overall Cognitive Status: Within Functional Limits for tasks assessed  General Comments      Exercises     Assessment/Plan    PT Assessment Patient needs continued PT services  PT Problem List Decreased strength;Decreased activity tolerance;Decreased balance;Decreased mobility;Decreased knowledge of use  of DME;Pain       PT Treatment Interventions DME instruction;Gait training;Functional mobility training;Therapeutic activities;Therapeutic exercise;Balance training;Patient/family education    PT Goals (Current goals can be found in the Care Plan section)  Acute Rehab PT Goals Patient Stated Goal: Regain IND PT Goal Formulation: With patient Time For Goal Achievement: 07/12/18 Potential to Achieve Goals: Fair    Frequency Min 3X/week   Barriers to discharge Decreased caregiver support Lives alone    Co-evaluation               AM-PAC PT "6 Clicks" Daily Activity  Outcome Measure Difficulty turning over in bed (including adjusting bedclothes, sheets and blankets)?: Unable Difficulty moving from lying on back to sitting on the side of the bed? : Unable Difficulty sitting down on and standing up from a chair with arms (e.g., wheelchair, bedside commode, etc,.)?: Unable Help needed moving to and from a bed to chair (including a wheelchair)?: A Lot Help needed walking in hospital room?: A Lot Help needed climbing 3-5 steps with a railing? : A Lot 6 Click Score: 9    End of Session   Activity Tolerance: Patient limited by fatigue Patient left: in chair;with call bell/phone within reach;with family/visitor present Nurse Communication: Mobility status PT Visit Diagnosis: Muscle weakness (generalized) (M62.81);Difficulty in walking, not elsewhere classified (R26.2);Unsteadiness on feet (R26.81)    Time: 7829-5621 PT Time Calculation (min) (ACUTE ONLY): 29 min   Charges:   PT Evaluation $PT Eval Moderate Complexity: 1 Mod PT Treatments $Gait Training: 8-22 mins        [name] PT Acute Rehabilitation Services Pager [phone] Office [phone]   [name] 06/28/2018, 4:03 PM

## 2018-06-29 ENCOUNTER — Encounter (HOSPITAL_COMMUNITY): Payer: Self-pay | Admitting: *Deleted

## 2018-06-29 ENCOUNTER — Inpatient Hospital Stay (HOSPITAL_COMMUNITY): Payer: Medicare Other | Admitting: Anesthesiology

## 2018-06-29 ENCOUNTER — Encounter (HOSPITAL_COMMUNITY): Admission: EM | Disposition: A | Discharge status: Skilled Nursing Facility | Payer: Self-pay | Source: Home / Self Care

## 2018-06-29 ENCOUNTER — Inpatient Hospital Stay (HOSPITAL_COMMUNITY): Payer: Medicare Other

## 2018-06-29 DIAGNOSIS — K922 Gastrointestinal hemorrhage, unspecified: Secondary | ICD-10-CM

## 2018-06-29 DIAGNOSIS — K920 Hematemesis: Secondary | ICD-10-CM

## 2018-06-29 HISTORY — PX: ESOPHAGOGASTRODUODENOSCOPY (EGD) WITH PROPOFOL: SHX5813

## 2018-06-29 LAB — COMPREHENSIVE METABOLIC PANEL
ALT: 11 U/L (ref 0–44)
AST: 20 U/L (ref 15–41)
Albumin: 2.7 g/dL — ABNORMAL LOW (ref 3.5–5.0)
Alkaline Phosphatase: 62 U/L (ref 38–126)
Anion gap: 9 (ref 5–15)
BUN: 48 mg/dL — ABNORMAL HIGH (ref 8–23)
CHLORIDE: 112 mmol/L — AB (ref 98–111)
CO2: 21 mmol/L — ABNORMAL LOW (ref 22–32)
CREATININE: 2.18 mg/dL — AB (ref 0.61–1.24)
Calcium: 8.7 mg/dL — ABNORMAL LOW (ref 8.9–10.3)
GFR calc Af Amer: 29 mL/min — ABNORMAL LOW (ref >60)
GFR calc non Af Amer: 25 mL/min — ABNORMAL LOW (ref >60)
Glucose, Bld: 185 mg/dL — ABNORMAL HIGH (ref 70–99)
Potassium: 3.6 mmol/L (ref 3.5–5.1)
Sodium: 142 mmol/L (ref 135–145)
Total Bilirubin: 1.2 mg/dL (ref 0.3–1.2)
Total Protein: 5.8 g/dL — ABNORMAL LOW (ref 6.5–8.1)

## 2018-06-29 LAB — GLUCOSE, CAPILLARY
GLUCOSE-CAPILLARY: 157 mg/dL — AB (ref 70–99)
GLUCOSE-CAPILLARY: 150 mg/dL — AB (ref 70–99)
GLUCOSE-CAPILLARY: 163 mg/dL — AB (ref 70–99)
Glucose-Capillary: 130 mg/dL — ABNORMAL HIGH (ref 70–99)
Glucose-Capillary: 149 mg/dL — ABNORMAL HIGH (ref 70–99)

## 2018-06-29 LAB — CBC WITH DIFFERENTIAL/PLATELET
ABS IMMATURE GRANULOCYTES: 0.1 10*3/uL — AB (ref 0.00–0.07)
BASOS ABS: 0 10*3/uL (ref 0.0–0.1)
BASOS PCT: 0 %
Eosinophils Absolute: 0.1 10*3/uL (ref 0.0–0.5)
Eosinophils Relative: 2 %
HCT: 25.1 % — ABNORMAL LOW (ref 39.0–52.0)
Hemoglobin: 8.3 g/dL — ABNORMAL LOW (ref 13.0–17.0)
IMMATURE GRANULOCYTES: 1 %
Lymphocytes Relative: 5 %
Lymphs Abs: 0.4 10*3/uL — ABNORMAL LOW (ref 0.7–4.0)
MCH: 31.8 pg (ref 26.0–34.0)
MCHC: 33.1 g/dL (ref 30.0–36.0)
MCV: 96.2 fL (ref 80.0–100.0)
Monocytes Absolute: 0.5 10*3/uL (ref 0.1–1.0)
Monocytes Relative: 7 %
NEUTROS ABS: 6 10*3/uL (ref 1.7–7.7)
NEUTROS PCT: 85 %
NRBC: 0.3 % — AB (ref 0.0–0.2)
PLATELETS: 87 10*3/uL — AB (ref 150–400)
RBC: 2.61 MIL/uL — ABNORMAL LOW (ref 4.22–5.81)
RDW: 15.2 % (ref 11.5–15.5)
WBC: 7.1 10*3/uL (ref 4.0–10.5)

## 2018-06-29 LAB — HEMOGLOBIN AND HEMATOCRIT, BLOOD
HCT: 26.6 % — ABNORMAL LOW (ref 39.0–52.0)
HEMATOCRIT: 24.9 % — AB (ref 39.0–52.0)
HEMOGLOBIN: 7.9 g/dL — AB (ref 13.0–17.0)
Hemoglobin: 8.6 g/dL — ABNORMAL LOW (ref 13.0–17.0)

## 2018-06-29 LAB — BASIC METABOLIC PANEL
ANION GAP: 7 (ref 5–15)
BUN: 47 mg/dL — ABNORMAL HIGH (ref 8–23)
CO2: 23 mmol/L (ref 22–32)
Calcium: 8.6 mg/dL — ABNORMAL LOW (ref 8.9–10.3)
Chloride: 110 mmol/L (ref 98–111)
Creatinine, Ser: 2.22 mg/dL — ABNORMAL HIGH (ref 0.61–1.24)
GFR, EST AFRICAN AMERICAN: 29 mL/min — AB (ref >60)
GFR, EST NON AFRICAN AMERICAN: 25 mL/min — AB (ref >60)
GLUCOSE: 155 mg/dL — AB (ref 70–99)
Potassium: 3.7 mmol/L (ref 3.5–5.1)
Sodium: 140 mmol/L (ref 135–145)

## 2018-06-29 LAB — MAGNESIUM
Magnesium: 2.1 mg/dL (ref 1.7–2.4)
Magnesium: 2.1 mg/dL (ref 1.7–2.4)

## 2018-06-29 LAB — PREPARE RBC (CROSSMATCH)

## 2018-06-29 LAB — PHOSPHORUS: PHOSPHORUS: 2.9 mg/dL (ref 2.5–4.6)

## 2018-06-29 SURGERY — ESOPHAGOGASTRODUODENOSCOPY (EGD) WITH PROPOFOL
Anesthesia: Monitor Anesthesia Care

## 2018-06-29 MED ORDER — SODIUM CHLORIDE 0.9 % IV SOLN
80.0000 mg | Freq: Once | INTRAVENOUS | Status: AC
  Administered 2018-06-29: 80 mg via INTRAVENOUS
  Filled 2018-06-29: qty 80

## 2018-06-29 MED ORDER — POTASSIUM CL IN DEXTROSE 5% 20 MEQ/L IV SOLN
20.0000 meq | INTRAVENOUS | Status: DC
  Administered 2018-06-29: 20 meq via INTRAVENOUS
  Filled 2018-06-29 (×2): qty 1000

## 2018-06-29 MED ORDER — ETOMIDATE 2 MG/ML IV SOLN
INTRAVENOUS | Status: DC | PRN
  Administered 2018-06-29 (×3): 2 mg via INTRAVENOUS

## 2018-06-29 MED ORDER — SODIUM CHLORIDE 0.9% IV SOLUTION
Freq: Once | INTRAVENOUS | Status: AC
  Administered 2018-06-29: 21:00:00 via INTRAVENOUS

## 2018-06-29 MED ORDER — ACETAMINOPHEN 10 MG/ML IV SOLN
1000.0000 mg | Freq: Four times a day (QID) | INTRAVENOUS | Status: DC
  Administered 2018-06-29 – 2018-06-30 (×3): 1000 mg via INTRAVENOUS
  Filled 2018-06-29 (×4): qty 100

## 2018-06-29 MED ORDER — LACTATED RINGERS IV SOLN
INTRAVENOUS | Status: DC
  Administered 2018-06-29: 1000 mL via INTRAVENOUS

## 2018-06-29 MED ORDER — SODIUM CHLORIDE 0.9 % IV SOLN: INTRAVENOUS | Status: DC

## 2018-06-29 MED ORDER — LIDOCAINE 2% (20 MG/ML) 5 ML SYRINGE
INTRAMUSCULAR | Status: DC | PRN
  Administered 2018-06-29: 60 mg via INTRAVENOUS

## 2018-06-29 MED ORDER — LACTATED RINGERS IV SOLN
INTRAVENOUS | Status: DC | PRN
  Administered 2018-06-29: 13:00:00 via INTRAVENOUS

## 2018-06-29 MED ORDER — SODIUM CHLORIDE 0.9 % IV SOLN
8.0000 mg/h | INTRAVENOUS | Status: AC
  Administered 2018-06-29 – 2018-07-02 (×7): 8 mg/h via INTRAVENOUS
  Filled 2018-06-29 (×10): qty 80

## 2018-06-29 MED ORDER — PROPOFOL 10 MG/ML IV BOLUS
INTRAVENOUS | Status: DC | PRN
  Administered 2018-06-29 (×3): 10 mg via INTRAVENOUS

## 2018-06-29 MED ORDER — PANTOPRAZOLE SODIUM 40 MG IV SOLR
40.0000 mg | Freq: Two times a day (BID) | INTRAVENOUS | Status: DC
  Administered 2018-07-02 – 2018-07-03 (×2): 40 mg via INTRAVENOUS
  Filled 2018-06-29 (×2): qty 40

## 2018-06-29 SURGICAL SUPPLY — 15 items

## 2018-06-29 NOTE — Interval H&P Note (Signed)
History and Physical Interval Note:  06/29/2018 12:47 PM  [name]  has presented today for surgery, with the diagnosis of Hematemesis  The various methods of treatment have been discussed with the patient and family. After consideration of risks, benefits and other options for treatment, the patient has consented to  Procedure(s): ESOPHAGOGASTRODUODENOSCOPY (EGD) WITH PROPOFOL (N/A) as a surgical intervention .  The patient's history has been reviewed, patient examined, no change in status, stable for surgery.  I have reviewed the patient's chart and labs.  Questions were answered to the patient's satisfaction.     [name]

## 2018-06-29 NOTE — Op Note (Signed)
[facility] Patient Name: [name] Procedure Date: 06/29/2018 MRN: [id number] Attending MD: [name] , MD Date of Birth: [birth year] CSN: [id number] Age: 84 Admit Type: Inpatient Procedure:                Upper GI endoscopy Indications:              Active gastrointestinal bleeding, Hematemesis Providers:                [name], MD, Cleda Daub, RN, Tinnie Gens, Technician, [name] CRNA Referring MD:             hospital team Medicines:                Propofol per Anesthesia, Monitored Anesthesia Care Complications:            No immediate complications. Estimated Blood Loss:     Estimated blood loss was minimal. Procedure:                Pre-Anesthesia Assessment:                           - Prior to the procedure, a History and Physical                            was performed, and patient medications and                            allergies were reviewed. The patient's tolerance of                            previous anesthesia was also reviewed. The risks                            and benefits of the procedure and the sedation                            options and risks were discussed with the patient.                            All questions were answered, and informed consent                            was obtained. Prior Anticoagulants: The patient has                            taken no previous anticoagulant or antiplatelet                            agents. ASA Grade Assessment: IV - A patient with                            severe systemic disease that is a constant threat  to life. After reviewing the risks and benefits,                            the patient was deemed in satisfactory condition to                            undergo the procedure.                           After obtaining informed consent, the endoscope was                            passed under  direct vision. Throughout the                            procedure, the patient's blood pressure, pulse, and                            oxygen saturations were monitored continuously. The                            [id number]-H190 ([id number] Olympus adult endoscope was                            introduced through the mouth, and advanced to the                            second part of duodenum. The upper GI endoscopy was                            accomplished without difficulty. The patient                            tolerated the procedure well. Scope In: Scope Out: Findings:      LA Grade D (one or more mucosal breaks involving at least 75% of       esophageal circumference) esophagitis with bleeding was found in the       entire esophagus.      The Z-line was found 40 cm from the incisors.      Patchy moderate inflammation characterized by erosions and erythema was       found in the gastric fundus and in the gastric body.      Linear areas of erosions noted likely due to NG suction trauma.      The examined duodenum was normal (also clear bile seen). Impression:               - LA Grade D acute esophagitis.                           - Z-line, 40 cm from the incisors.                           - Acute gastritis.                           -  Normal examined duodenum.                           - No specimens collected. Moderate Sedation:      N/A- Per Anesthesia Care Recommendation:           - NPO.                           - Avoid NG tube suctioning if ok with surgery and                            ideally would remove NGT completely but defer to                            surgery. Continue Protonix drip.                           - Observe patient's clinical course. Procedure Code(s):        --- Professional ---                           [phone], Esophagogastroduodenoscopy, flexible,                            transoral; diagnostic, including collection of                             specimen(s) by brushing or washing, when performed                            (separate procedure) Diagnosis Code(s):        --- Professional ---                           K92.0, Hematemesis                           K92.2, Gastrointestinal hemorrhage, unspecified                           K29.00, Acute gastritis without bleeding                           K20.9, Esophagitis, unspecified CPT copyright 2018 [employer]. All rights reserved. The codes documented in this report are preliminary and upon coder review may  be revised to meet current compliance requirements. [name], MD 06/29/2018 1:14:11 PM This report has been signed electronically. Number of Addenda: 0

## 2018-06-29 NOTE — Progress Notes (Signed)
PROGRESS NOTE    [name]  SPQ:[id number] DOB: 1927-08-30 DOA: 06/25/2018 PCP: [name], MD  Brief Narrative: The patient is an 84 year old male with history of CAD status post CABG, ischemic cardiomyopathy status post ICD placement, history of paroxysmal ventricular tachycardia who underwent a right upper quadrant abdominal pain that started yesterday afternoon.  CT abdomen showed a ventral hernia containing a perforated viscus with likely ischemic bowel and general surgery was consulted and admitted the patient to general surgery and medicine was consulted to help manage medical issues.  Cardiology did preoperative clearance and patient underwent surgery on 06/26/18.  This AM I was paged by Nurse after I saw the patient; as patient started having an Upper GIB and had bright red blood in NG. Surgery Deferred this to Medicine so I started the patient on a Protonix gtt and called GI. I also ordered 1 unit of pRBC to be transfused given drop in Hb/Hct.  The patient was found to have acute esophagitis bleeding as well as acute gastritis.  GI recommended n.p.o. and avoid NG suctioning if okay with surgery and recommended ideally removing the NG tube this will be deferred to surgery.  We will continue on Protonix drip for now  Assessment & Plan:   Principal Problem:   Ventral incisional hernia Active Problems:   Chronic systolic heart failure (HCC)   ICD (implantable cardioverter-defibrillator) in place   CORONARY ARTERY BYPASS GRAFT, HX OF   DM type 2 causing vascular disease ([place])   Preoperative cardiovascular examination   Ischemic cardiomyopathy   Anemia of chronic disease   Hypertension   A-fib (HCC)   Small bowel ischemia (HCC)   Thrombocytopenia (HCC)   Hematemesis  Ventral hernia with perforated viscus and possible ischemic bowel postoperative day 3 with exploratory laparotomy, resection of small bowel and lysis of adhesions -Continue n.p.o. With Ice Chips and NG tube  until bowel function returns and is reliable; ideally would like to remove NG tube completely given his esophageal bleed and esophagitis but will defer this to general surgery -Per General Surgery  -Continue with Empiric antibodies with IV Zosyn -Pain control per general surgery -Since patient is not eating he was placed the patient on D5W normal saline at rate of 50 mils per hour for 20 hours again; this is been changed to his D5W as his chloride started elevating  CAD status post CABG -Currently denies any chest pain -Cardiology was consulted for preoperative clearance  History of ischemic cardiomyopathy with a EF of 35% status post AICD placement -Cardiology consulted for preoperative cardiovascular clearance -Per cardiology continue beta-blocker and this was changed to IV metoprolol given that he is n.p.o. for Carvedilol -AICD was managed with a magnet prior to surgery -Cardiology checked an EKG -Patient was evaluated by heart failure Dr. Dr. Aundra Dubin and spironolactone was added  -Cardiology was a moderate risk for intermediate surgery given his underlying comorbidities and had no contraindication for planned surgery -Gently fluid hydration as patient is n.p.o. with D5W + KCL 20 mEQ at 50 mL per hour with close monitoring of volume status and will continue today   -Since Admission patient is + 4.946 L and will need strict I's and O's and daily weights -BNP was elevated at 433.0 however currently remains euvolemic  CKD stage III/IV -Creatinine is not far from baseline -BUN/Cr is now 48/2.18 -Avoid nephrotoxic medications possible -Gentle maintenance IV fluid hydration again with D5W + 20 mEQ of KCl at 50 mL's per hour for 20  hours and reevaluate fluid status in a.m. -Continue monitor and trend renal function repeat CMP in a.m.  Anemia of Chronic Kidney Disease/Normocytic Anemia/Acute Blood Loss Anemia in the setting of Upper GIB from Esophagitis and Gastritis  -Patient's  hemoglobin/hematocrit went from 12.0/36.3 and is now 7.9/24.9 -In the setting of Surgical Intervention and possible also dilutional drop given fluid resuscitation; now felt to be secondary to a GI bleed which is upper in nature -Continue monitor for signs and symptoms of bleeding -I started the patient on Protonix drip and have type and screen and transfuse 1 unit PRBCs -Gastroenterology Dr. Wilford Corner has evaluated the patient and took the patient for an EGD and was found to be patchy moderate inflammation characterized by erosions and erythema along with esophagitis with bleeding throughout the entire esophagus. -Continue with Protonix drip and n.p.o. at this time -Continue monitor H&H's every 6 -Per GI recommendations ideally would like to remove NG tube but this will be deferred to general surgery as patient has had surgical intervention in his abdomen  Thrombocytopenia -Patient's platelet count dropped from 147 is now 87 -Continue monitor for signs of bleeding -Repeat CBC in a.m.  Diabetes mellitus type 2 -Hemoglobin A1c was 7.3 -This on sensitive NovoLog sliding scale insulin before meals -Continue monitor CBGs; blood glucose has been ranging from 130-168 -We will consult diabetes coronary for further evaluation recommendations  Hypokalemia -Patient's potassium this morning was 3.6 -Replete with IV potassium chloride 40 mEQ yesterday  -Continue monitor replete as necessary -Repeat CMP in a.m.  DVT prophylaxis: Per General Surgery; SCDs and chemical DVT prophylaxis when not bleeding  Code Status: FULL CODE Family Communication: Discussed with Family at bedside Disposition Plan: Per General Surgery; Have Consulted Social worker for SNF placement  Consultants:   [facility]   General Surgery  Cardiology  Gastroenterology    Procedures: Done by Dr. [name] and Dr. Excell Seltzer 1. Exploratory laparotomy 2. Resection of small bowel x2 3. Lysis of adhesions x  90 minutes  EGD on 06/29/18 Findings:      LA Grade D (one or more mucosal breaks involving at least 75% of       esophageal circumference) esophagitis with bleeding was found in the       entire esophagus.      The Z-line was found 40 cm from the incisors.      Patchy moderate inflammation characterized by erosions and erythema was       found in the gastric fundus and in the gastric body.      Linear areas of erosions noted likely due to NG suction trauma.      The examined duodenum was normal (also clear bile seen). Impression:               - LA Grade D acute esophagitis.                           - Z-line, 40 cm from the incisors.                           - Acute gastritis.                           - Normal examined duodenum.                           -  No specimens collected.   Antimicrobials:  Anti-infectives (From admission, onward)   Start     Dose/Rate Route Frequency Ordered Stop   06/26/18 1400  piperacillin-tazobactam (ZOSYN) IVPB 3.375 g     3.375 g 12.5 mL/hr over 240 Minutes Intravenous Every 8 hours 06/26/18 1218     06/26/18 0815  cefoTEtan (CEFOTAN) 2 g in sodium chloride 0.9 % 100 mL IVPB     2 g 200 mL/hr over 30 Minutes Intravenous On call to O.R. 06/26/18 0809 06/26/18 1103   06/26/18 0600  piperacillin-tazobactam (ZOSYN) IVPB 2.25 g  Status:  Discontinued     2.25 g 100 mL/hr over 30 Minutes Intravenous Every 6 hours 06/26/18 0447 06/26/18 1217   06/25/18 2345  piperacillin-tazobactam (ZOSYN) IVPB 3.375 g     3.375 g 100 mL/hr over 30 Minutes Intravenous  Once 06/25/18 2341 06/26/18 0031   06/25/18 2315  ceFEPIme (MAXIPIME) 2 g in sodium chloride 0.9 % 100 mL IVPB  Status:  Discontinued     2 g 200 mL/hr over 30 Minutes Intravenous  Once 06/25/18 2306 06/25/18 2341   06/25/18 2315  metroNIDAZOLE (FLAGYL) IVPB 500 mg  Status:  Discontinued     500 mg 100 mL/hr over 60 Minutes Intravenous Every 8 hours 06/25/18 2306 06/25/18 2341     Subjective: Seen  and examined at bedside and was upset about orange juice this morning.  States he did have some abdominal soreness.  No nausea or vomiting.  Had an okay night however after I left the room patient subsequently started having upper GI bleed a few hours later.  I was paged by general surgery about this so I placed the patient on a Protonix drip transfuse patient 1 unit of PRBCs and called GI who took the patient for an EGD which revealed bleeding esophagitis and gastritis.  Patient had no chest pain or lightheadedness or dizziness.  He had bilious output earlier on which changed to bloody output.  No other concerns or claims at this time  Objective: Vitals:   06/29/18 1314 06/29/18 1320 06/29/18 1330 06/29/18 1340  BP:  (!) 125/52 (!) 125/58 (!) 133/56  Pulse: 60 (!) 59 (!) 57 60  Resp: 14 18 17 16   Temp:      TempSrc:      SpO2: 100% 100% 99% 100%  Weight:      Height:        Intake/Output Summary (Last 24 hours) at 06/29/2018 1456 Last data filed at 06/29/2018 1357 Gross per 24 hour  Intake 2029.9 ml  Output 820 ml  Net 1209.9 ml   Filed Weights   06/26/18 0902 06/28/18 0600 06/29/18 0122  Weight: 68.2 kg 72.2 kg 73.4 kg   Examination: Physical Exam this a.m.:  Constitutional: Patient is an elderly African-American male is currently no acute distress appears calm but slightly agitated; appeared slightly uncomfortable this morning Eyes: Sclera anicteric.  Lids and conjunctive are normal ENMT: External ears and nose appear normal.  Grossly normal hearing.  NG tube in place with bilious drainage Neck: Appears supple no JVD Respiratory: Diminished to auscultation bilaterally no appreciable wheezing, rales, rhonchi.  Patient had unlabored breathing and is not using any accessory muscles to breathe. Cardiovascular: Regular rate and rhythm.  No appreciable murmurs, rubs, gallops.  Has trace lower extremity edema Abdomen: Soft, has an abdominal binder.  Slightly tender.  Bowel sounds are  diminished GU: Deferred Musculoskeletal: No contractures or cyanosis.  No joint deformities were noted Skin: No  rashes or lesions limited skin evaluation.  Has a wound VAC on his abdominal incision and an abdominal binder. Neurologic: Nerves II through XII grossly intact no appreciable focal deficits. Psychiatric: Normal judgment and insight.  Patient is awake, alert and oriented x3..  Data Reviewed: I have personally reviewed following labs and imaging studies  CBC: Recent Labs  Lab 06/25/18 2117 06/27/18 0557 06/28/18 0547 06/29/18 0500 06/29/18 1400  WBC 9.0 5.8 6.1 7.1  --   NEUTROABS  --   --  5.1 6.0  --   HGB 12.0* 9.0* 8.3* 8.3* 7.9*  HCT 36.3* 27.7* 25.4* 25.1* 24.9*  MCV 94.0 97.5 95.1 96.2  --   PLT 147* 73* 71* 87*  --    Basic Metabolic Panel: Recent Labs  Lab 06/26/18 0809 06/27/18 0557 06/28/18 0547 06/28/18 1952 06/29/18 0500  NA 134* 136 141 140 142  K 4.4 4.0 3.3* 3.7 3.6  CL 98 103 109 110 112*  CO2 25 22 23 23  21*  GLUCOSE 181* 162* 179* 155* 185*  BUN 65* 52* 45* 47* 48*  CREATININE 2.10* 2.11* 2.12* 2.22* 2.18*  CALCIUM 8.9 8.2* 8.5* 8.6* 8.7*  MG 2.1  --  2.0 2.1 2.1  PHOS  --   --  2.7  --  2.9   GFR: Estimated Creatinine Clearance: 21.1 mL/min (A) (by C-G formula based on SCr of 2.18 mg/dL (H)). Liver Function Tests: Recent Labs  Lab 06/25/18 2117 06/28/18 0547 06/29/18 0500  AST 19 20 20   ALT 11 11 11   ALKPHOS 96 44 62  BILITOT 1.2 0.9 1.2  PROT 7.6 5.7* 5.8*  ALBUMIN 4.1 2.6* 2.7*   No results for input(s): LIPASE, AMYLASE in the last 168 hours. No results for input(s): AMMONIA in the last 168 hours. Coagulation Profile: Recent Labs  Lab 06/26/18 0809  INR 0.94   Cardiac Enzymes: Recent Labs  Lab 06/26/18 0809  TROPONINI 0.04*   BNP (last 3 results) No results for input(s): PROBNP in the last 8760 hours. HbA1C: Recent Labs    06/27/18 0558  HGBA1C 7.3*   CBG: Recent Labs  Lab 06/28/18 1206 06/28/18 1706  06/28/18 2144 06/29/18 0736 06/29/18 1136  GLUCAP 146* 150* 152* 157* 130*   Lipid Profile: No results for input(s): CHOL, HDL, LDLCALC, TRIG, CHOLHDL, LDLDIRECT in the last 72 hours. Thyroid Function Tests: No results for input(s): TSH, T4TOTAL, FREET4, T3FREE, THYROIDAB in the last 72 hours. Anemia Panel: No results for input(s): VITAMINB12, FOLATE, FERRITIN, TIBC, IRON, RETICCTPCT in the last 72 hours. Sepsis Labs: No results for input(s): PROCALCITON, LATICACIDVEN in the last 168 hours.  Recent Results (from the past 240 hour(s))  MRSA PCR Screening     Status: None   Collection Time: 06/26/18  1:48 AM  Result Value Ref Range Status   MRSA by PCR NEGATIVE NEGATIVE Final    Comment:        The GeneXpert MRSA Assay (FDA approved for NASAL specimens only), is one component of a comprehensive MRSA colonization surveillance program. It is not intended to diagnose MRSA infection nor to guide or monitor treatment for MRSA infections. Performed at [facility], [place] [address], [place], [place] [address]     Radiology Studies: No results found. Scheduled Meds: . chlorhexidine  15 mL Mouth Rinse BID  . Chlorhexidine Gluconate Cloth  6 each Topical Daily  . insulin aspart  0-9 Units Subcutaneous TID WC  . mouth rinse  15 mL Mouth Rinse q12n4p  .  metoprolol tartrate  2.5 mg Intravenous Q6H  . [START ON 07/02/2018] pantoprazole  40 mg Intravenous Q12H  . sodium chloride flush  10-40 mL Intracatheter Q12H   Continuous Infusions: . sodium chloride Stopped (06/29/18 1033)  . acetaminophen 1,000 mg (06/29/18 1421)  . dextrose 5 % with KCl 20 mEq / L 50 mL/hr at 06/29/18 1033  . pantoprozole (PROTONIX) infusion Stopped (06/29/18 1200)  . piperacillin-tazobactam (ZOSYN)  IV Stopped (06/29/18 0944)    LOS: 4 days   NON-CONSECUTIVE CRITICAL CARE TIME: [place], DO [facility] PAGER is on [place]  If 7PM-7AM, please contact  night-coverage [email or web address] Password [email or web address] 06/29/2018, 2:56 PM

## 2018-06-29 NOTE — Progress Notes (Signed)
Advanced NG 5cm per radiology. LWIS in place. Green bile drainage present.Hoyle Barr, RN

## 2018-06-29 NOTE — Progress Notes (Signed)
Pharmacy Antibiotic Note  [name] is a 84 y.o. male admitted on 06/25/2018 with Intra-abdominal infection.  Presented with ventral hernia and CT findings concerning for perforated viscus potentially at prior small bowel anastomosis.  Pharmacy has been consulted for zosyn dosing.  06/29/2018  Full day abx #4. Afebrile, WBC 7.1, Scr 2.18, CrCL 21 ml/min   Plan: Zosyn 3.375g IV Q8H infused over 4hrs.  Follow up renal fxn, culture results, and clinical course. F/u ability to de-escalate antibiotics.   Height: 5\' 4"  (162.6 cm) Weight: 161 lb 13.1 oz (73.4 kg) IBW/kg (Calculated) : 59.2  Temp (24hrs), Avg:98 F (36.7 C), Min:97.6 F (36.4 C), Max:98.3 F (36.8 C)  Recent Labs  Lab 06/25/18 2117 06/26/18 0809 06/27/18 0557 06/28/18 0547 06/28/18 1952 06/29/18 0500  WBC 9.0  --  5.8 6.1  --  7.1  CREATININE 2.23* 2.10* 2.11* 2.12* 2.22* 2.18*    Estimated Creatinine Clearance: 21.1 mL/min (A) (by C-G formula based on SCr of 2.18 mg/dL (H)).    No Known Allergies  Antimicrobials this admission: 06/25/18 Zosyn >>  Dose adjustments this admission: 10/22 Zosyn dose adjusted for renal function.   Microbiology results: 10/22 MRSA PCR: negative  Thank you for allowing pharmacy to be a part of this patient's care.  [name], Pharm.D [phone] 06/29/2018 11:49 AM

## 2018-06-29 NOTE — Progress Notes (Signed)
Pharmacist Heart Failure Core Measure Documentation  Assessment: [name] has an EF documented as 35% on 09/21/2017 by echo.  Rationale: Heart failure patients with left ventricular systolic dysfunction (LVSD) and an EF < 40% should be prescribed an angiotensin converting enzyme inhibitor (ACEI) or angiotensin receptor blocker (ARB) at discharge unless a contraindication is documented in the medical record.  This patient is not currently on an ACEI or ARB for HF.  This note is being placed in the record in order to provide documentation that a contraindication to the use of these agents is present for this encounter.  ACE Inhibitor or Angiotensin Receptor Blocker is contraindicated (specify all that apply)  []   ACEI allergy AND ARB allergy []   Angioedema []   Moderate or severe aortic stenosis []   Hyperkalemia []   Hypotension []   Renal artery stenosis [x]   Worsening renal function, preexisting renal disease or dysfunction  [name], Pharm.D [phone] 06/29/2018 11:51 AM

## 2018-06-29 NOTE — Progress Notes (Signed)
OT Cancellation Note  Patient Details Name: [name] MRN: [id number] DOB: [birth year]   Cancelled Treatment:    Reason Eval/Treat Not Completed: Medical issues which prohibited therapy- pt just received upper GI endoscopy with anesthesia. Will hold OT eval this date and continue to follow as medically available and appropriate.  Zenovia Jarred, MSOT, OTR/L [place] OT/ Acute Relief OT [phone]  Zenovia Jarred 06/29/2018, 2:10 PM

## 2018-06-29 NOTE — Anesthesia Postprocedure Evaluation (Signed)
Anesthesia Post Note  Patient: [name]  Procedure(s) Performed: ESOPHAGOGASTRODUODENOSCOPY (EGD) WITH PROPOFOL (N/A )     Patient location during evaluation: PACU Anesthesia Type: MAC Level of consciousness: awake and alert Pain management: pain level controlled Vital Signs Assessment: post-procedure vital signs reviewed and stable Respiratory status: spontaneous breathing, nonlabored ventilation, respiratory function stable and patient connected to nasal cannula oxygen Cardiovascular status: stable and blood pressure returned to baseline Postop Assessment: no apparent nausea or vomiting Anesthetic complications: no    Last Vitals:  Vitals:   06/29/18 1330 06/29/18 1340  BP: (!) 125/58 (!) 133/56  Pulse: (!) 57 60  Resp: 17 16  Temp:    SpO2: 99% 100%    Last Pain:  Vitals:   06/29/18 1340  TempSrc:   PainSc: 0-No pain                 Tiajuana Amass

## 2018-06-29 NOTE — Consult Note (Signed)
Referring Provider: Dr. Alfredia Ferguson Primary Care Physician:  [name], MD Primary Gastroenterologist:  Althia Forts  Reason for Consultation:  Hematemesis  HPI: [name] is a 84 y.o. male s/p exp lap, partial small bowel resection, and lysis of adhesions due to perforated viscus and abdominal wall hernia who had blood tinged fluid in NGT this morning described as a small amount. Currently, having bloody tinged cloudy bilious fluid in NGT. Reports history of peptic ulcer disease over 10 years ago. Denies abdominal pain. Caretaker at bedside. Hgb 8.3.  Past Medical History:  Diagnosis Date  . AICD (automatic cardioverter/defibrillator) present   . Arthritis    "shoulders" (10/31/2014)  . CAD (coronary artery disease)    s/p CABG; s/p Pacemaker  . Diverticulosis of colon   . GERD (gastroesophageal reflux disease)   . Gout   . Hyperlipidemia   . Hypertension   . Ischemic cardiomyopathy    s/p ICD  . On home oxygen therapy    "2L prn" (10/31/2014)  . Paroxysmal ventricular tachycardia ([place])   . SBO (small bowel obstruction) ([place]) 10/31/2014  . Shortness of breath dyspnea    with exertion  . Systolic CHF with reduced left ventricular function, NYHA class 2 ([place])     Past Surgical History:  Procedure Laterality Date  . BI-VENTRICULAR IMPLANTABLE CARDIOVERTER DEFIBRILLATOR UPGRADE N/A 01/22/2014   Procedure: BI-VENTRICULAR IMPLANTABLE CARDIOVERTER DEFIBRILLATOR UPGRADE;  Surgeon: Evans Lance, MD;  Location: [facility] CATH LAB;  Service: Cardiovascular;  Laterality: N/A;  . BOWEL RESECTION    . CARDIAC CATHETERIZATION  03/2011   Archie Endo 01/18/2011  . CARDIAC DEFIBRILLATOR PLACEMENT  07/2003   Archie Endo 01/18/2011  . CATARACT EXTRACTION W/ INTRAOCULAR LENS  IMPLANT, BILATERAL Bilateral   . CATARACT EXTRACTION W/PHACO Right 12/17/2014   Procedure: PHACOEMULSIFICATION CATARACT EXTRACTION WITH IOL IMPLANT RIGHT EYE;  Surgeon: Marylynn Pearson, MD;  Location: [place];  Service: Ophthalmology;   Laterality: Right;  . CHOLECYSTECTOMY    . CORONARY ANGIOPLASTY WITH STENT PLACEMENT  04/2011   2 stents/notes 05/03/2011  . CORONARY ARTERY BYPASS GRAFT  03/2003   CABG X3/notes 01/18/2011  . EYE SURGERY Bilateral    [place]  . IMPLANTABLE CARDIOVERTER DEFIBRILLATOR (ICD) GENERATOR CHANGE Left 10/12/2011   Procedure: ICD GENERATOR CHANGE;  Surgeon: Evans Lance, MD;  Location: [facility] CATH LAB;  Service: Cardiovascular;  Laterality: Left;  . LAPAROTOMY N/A 06/26/2018   Procedure: EXPLORATORY LAPAROTOMY SMALL BOWEL RESECTION X 2;  Surgeon: Ileana Roup, MD;  Location: [facility] ORS;  Service: General;  Laterality: N/A;  . PACEMAKER PLACEMENT    . PACEMAKER REVISION N/A 10/12/2011   Procedure: PACEMAKER REVISION;  Surgeon: Evans Lance, MD;  Location: [facility] CATH LAB;  Service: Cardiovascular;  Laterality: N/A;  . TEE WITH CARDIOVERSION  03/2003   Archie Endo 01/18/2011  . TONSILLECTOMY      Prior to Admission medications   Medication Sig Start Date End Date Taking? Authorizing Provider  acetaminophen (TYLENOL) 500 MG tablet Take 500 mg by mouth 2 (two) times daily as needed for mild pain.   Yes [provider]  acidophilus (RISAQUAD) CAPS capsule Take 1 capsule by mouth daily.   Yes [provider]  allopurinol (ZYLOPRIM) 300 MG tablet Take 300 mg by mouth daily.     Yes [provider]  atorvastatin (LIPITOR) 40 MG tablet Take 1 tablet (40 mg total) by mouth daily at 6 PM. 03/06/18  Yes Larey Dresser, MD  carvedilol (COREG) 6.25 MG tablet Take 1.5 tablets (9.375  mg total) by mouth 2 (two) times daily with a meal. 03/27/18  Yes Larey Dresser, MD  escitalopram (LEXAPRO) 10 MG tablet Take 1 tablet (10 mg total) by mouth daily. 06/28/15  Yes Ghimire, Henreitta Leber, MD  esomeprazole (NEXIUM) 20 MG capsule Take 20 mg by mouth every morning.    Yes [provider]  famotidine (PEPCID) 20 MG tablet Take 20 mg by mouth daily.    Yes [provider]  FINASTERIDE PO Take  1 tablet by mouth every evening.   Yes [provider]  ketotifen (ZADITOR) 0.025 % ophthalmic solution Place 1 drop into both eyes daily as needed (dry eyes).    Yes [provider]  LORazepam (ATIVAN) 0.5 MG tablet Take 0.5 mg by mouth 2 (two) times daily as needed for anxiety.  08/22/16  Yes [provider]  PATADAY 0.2 % SOLN Place 1 drop into both eyes daily.  01/14/14  Yes [provider]  RA ASPIRIN ADULT LOW STRENGTH 81 MG EC tablet Take 1 tablet by mouth daily. 09/01/14  Yes [provider]  spironolactone (ALDACTONE) 25 MG tablet Take 0.5 tablets (12.5 mg total) by mouth daily. 05/29/18  Yes Larey Dresser, MD  torsemide (DEMADEX) 20 MG tablet Take 2 tablets (40 mg total) every other day alternating with 1 tablet (20 mg total) every other day. 05/21/18  Yes Larey Dresser, MD  NITROSTAT 0.4 MG SL tablet Place 0.4 mg under the tongue every 5 (five) minutes as needed for chest pain (MAX 3 TABLETS).  12/05/12   [provider]  oxyCODONE (OXY IR/ROXICODONE) 5 MG immediate release tablet Take 1 tablet (5 mg total) by mouth every 6 (six) hours as needed for moderate pain. Patient not taking: Reported on 06/25/2018 06/28/15   Jonetta Osgood, MD  tiZANidine (ZANAFLEX) 2 MG tablet Take 1 tablet (2 mg total) by mouth every 8 (eight) hours as needed for muscle spasms. Patient not taking: Reported on 06/25/2018 06/18/15   Gareth Morgan, MD    Scheduled Meds: . chlorhexidine  15 mL Mouth Rinse BID  . Chlorhexidine Gluconate Cloth  6 each Topical Daily  . insulin aspart  0-9 Units Subcutaneous TID WC  . mouth rinse  15 mL Mouth Rinse q12n4p  . metoprolol tartrate  2.5 mg Intravenous Q6H  . [START ON 07/02/2018] pantoprazole  40 mg Intravenous Q12H  . sodium chloride flush  10-40 mL Intracatheter Q12H   Continuous Infusions: . sodium chloride 10 mL/hr at 06/29/18 0200  . acetaminophen    . dextrose 5 % with KCl 20 mEq / L 20 mEq  (06/29/18 1033)  . pantoprazole (PROTONIX) IVPB    . pantoprozole (PROTONIX) infusion    . piperacillin-tazobactam (ZOSYN)  IV 3.375 g (06/29/18 0541)   PRN Meds:.sodium chloride, HYDROmorphone (DILAUDID) injection, ondansetron **OR** ondansetron (ZOFRAN) IV, sodium chloride flush  Allergies as of 06/25/2018  . (No Known Allergies)    Family History  Problem Relation Age of Onset  . Heart Problems Mother   . Diabetes Mother   . Heart Problems Brother     Social History   Socioeconomic History  . Marital status: Divorced    Spouse name: Not on file  . Number of children: Not on file  . Years of education: Not on file  . Highest education level: Not on file  Occupational History  . Occupation: Retired  Scientific laboratory technician  . Financial resource strain: Not on file  . Food insecurity:  Worry: Not on file    Inability: Not on file  . Transportation needs:    Medical: Not on file    Non-medical: Not on file  Tobacco Use  . Smoking status: Never Smoker  . Smokeless tobacco: Former Systems developer    Types: Chew  . Tobacco comment: no chew in over 3 years  Substance and Sexual Activity  . Alcohol use: Yes    Comment: "might take a drink during the holidays"  . Drug use: No  . Sexual activity: Never  Lifestyle  . Physical activity:    Days per week: Not on file    Minutes per session: Not on file  . Stress: Not on file  Relationships  . Social connections:    Talks on phone: Not on file    Gets together: Not on file    Attends religious service: Not on file    Active member of club or organization: Not on file    Attends meetings of clubs or organizations: Not on file    Relationship status: Not on file  . Intimate partner violence:    Fear of current or ex partner: Not on file    Emotionally abused: Not on file    Physically abused: Not on file    Forced sexual activity: Not on file  Other Topics Concern  . Not on file  Social History Narrative  . Not on file    Review  of Systems: All negative except as stated above in HPI.  Physical Exam: Vital signs: Vitals:   06/29/18 0600 06/29/18 0800  BP: (!) 132/44   Pulse: 71   Resp: (!) 21   Temp:  98.3 F (36.8 C)  SpO2: 97%    Last BM Date: (PTA) General:  Lethargic, elderly, frail, Well-developed, well-nourished, pleasant and cooperative in NAD Head: normocephalic, atraumatic Eyes: anicteric sclera ENT: cloudy bile with blood tinged in NG tube Neck: supple, nontender Lungs:  Clear throughout to auscultation.   No wheezes, crackles, or rhonchi. No acute distress. Heart:  Regular rate and rhythm; no murmurs, clicks, rubs,  or gallops. Abdomen: minimal tenderness; midline incision with wound vac in place; abd binder; decreased BS Rectal:  Deferred Ext: no edema  GI:  Lab Results: Recent Labs    06/27/18 0557 06/28/18 0547 06/29/18 0500  WBC 5.8 6.1 7.1  HGB 9.0* 8.3* 8.3*  HCT 27.7* 25.4* 25.1*  PLT 73* 71* 87*   BMET Recent Labs    06/28/18 0547 06/28/18 1952 06/29/18 0500  NA 141 140 142  K 3.3* 3.7 3.6  CL 109 110 112*  CO2 23 23 21*  GLUCOSE 179* 155* 185*  BUN 45* 47* 48*  CREATININE 2.12* 2.22* 2.18*  CALCIUM 8.5* 8.6* 8.7*   LFT Recent Labs    06/29/18 0500  PROT 5.8*  ALBUMIN 2.7*  AST 20  ALT 11  ALKPHOS 62  BILITOT 1.2   PT/INR No results for input(s): LABPROT, INR in the last 72 hours.   Studies/Results: No results found.  Impression/Plan: 84 yo s/p abdominal hernia surgery as stated above with blood in NGT this morning question ulcer vs NG trauma vs esophagitis/gastritis. EGD today to further evaluate. Continue Protonix drip. Supportive care.    LOS: 4 days   [name]  06/29/2018, 11:10 AM  Questions please call [phone]

## 2018-06-29 NOTE — H&P (View-Only) (Signed)
Referring Provider: Dr. Alfredia Ferguson Primary Care Physician:  [name], MD Primary Gastroenterologist:  Althia Forts  Reason for Consultation:  Hematemesis  HPI: [name] is a 84 y.o. male s/p exp lap, partial small bowel resection, and lysis of adhesions due to perforated viscus and abdominal wall hernia who had blood tinged fluid in NGT this morning described as a small amount. Currently, having bloody tinged cloudy bilious fluid in NGT. Reports history of peptic ulcer disease over 10 years ago. Denies abdominal pain. Caretaker at bedside. Hgb 8.3.  Past Medical History:  Diagnosis Date  . AICD (automatic cardioverter/defibrillator) present   . Arthritis    "shoulders" (10/31/2014)  . CAD (coronary artery disease)    s/p CABG; s/p Pacemaker  . Diverticulosis of colon   . GERD (gastroesophageal reflux disease)   . Gout   . Hyperlipidemia   . Hypertension   . Ischemic cardiomyopathy    s/p ICD  . On home oxygen therapy    "2L prn" (10/31/2014)  . Paroxysmal ventricular tachycardia ([place])   . SBO (small bowel obstruction) ([place]) 10/31/2014  . Shortness of breath dyspnea    with exertion  . Systolic CHF with reduced left ventricular function, NYHA class 2 ([place])     Past Surgical History:  Procedure Laterality Date  . BI-VENTRICULAR IMPLANTABLE CARDIOVERTER DEFIBRILLATOR UPGRADE N/A 01/22/2014   Procedure: BI-VENTRICULAR IMPLANTABLE CARDIOVERTER DEFIBRILLATOR UPGRADE;  Surgeon: Evans Lance, MD;  Location: [facility] CATH LAB;  Service: Cardiovascular;  Laterality: N/A;  . BOWEL RESECTION    . CARDIAC CATHETERIZATION  03/2011   Archie Endo 01/18/2011  . CARDIAC DEFIBRILLATOR PLACEMENT  07/2003   Archie Endo 01/18/2011  . CATARACT EXTRACTION W/ INTRAOCULAR LENS  IMPLANT, BILATERAL Bilateral   . CATARACT EXTRACTION W/PHACO Right 12/17/2014   Procedure: PHACOEMULSIFICATION CATARACT EXTRACTION WITH IOL IMPLANT RIGHT EYE;  Surgeon: Marylynn Pearson, MD;  Location: [place];  Service: Ophthalmology;   Laterality: Right;  . CHOLECYSTECTOMY    . CORONARY ANGIOPLASTY WITH STENT PLACEMENT  04/2011   2 stents/notes 05/03/2011  . CORONARY ARTERY BYPASS GRAFT  03/2003   CABG X3/notes 01/18/2011  . EYE SURGERY Bilateral    [place]  . IMPLANTABLE CARDIOVERTER DEFIBRILLATOR (ICD) GENERATOR CHANGE Left 10/12/2011   Procedure: ICD GENERATOR CHANGE;  Surgeon: Evans Lance, MD;  [facility] CATH LAB;  Service: Cardiovascular;  Laterality: Left;  . LAPAROTOMY N/A 06/26/2018   Procedure: EXPLORATORY LAPAROTOMY SMALL BOWEL RESECTION X 2;  Surgeon: Ileana Roup, MD;  Location: [facility];  Service: General;  Laterality: N/A;  . PACEMAKER PLACEMENT    . PACEMAKER REVISION N/A 10/12/2011   Procedure: PACEMAKER REVISION;  Surgeon: Evans Lance, MD;  Location: [facility] CATH LAB;  Service: Cardiovascular;  Laterality: N/A;  . TEE WITH CARDIOVERSION  03/2003   Archie Endo 01/18/2011  . TONSILLECTOMY      Prior to Admission medications   Medication Sig Start Date End Date Taking? Authorizing Provider  acetaminophen (TYLENOL) 500 MG tablet Take 500 mg by mouth 2 (two) times daily as needed for mild pain.   Yes [provider]  acidophilus (RISAQUAD) CAPS capsule Take 1 capsule by mouth daily.   Yes [provider]  allopurinol (ZYLOPRIM) 300 MG tablet Take 300 mg by mouth daily.     Yes [provider]  atorvastatin (LIPITOR) 40 MG tablet Take 1 tablet (40 mg total) by mouth daily at 6 PM. 03/06/18  Yes Larey Dresser, MD  carvedilol (COREG) 6.25 MG tablet Take 1.5 tablets (9.375  mg total) by mouth 2 (two) times daily with a meal. 03/27/18  Yes Larey Dresser, MD  escitalopram (LEXAPRO) 10 MG tablet Take 1 tablet (10 mg total) by mouth daily. 06/28/15  Yes Ghimire, Henreitta Leber, MD  esomeprazole (NEXIUM) 20 MG capsule Take 20 mg by mouth every morning.    Yes [provider]  famotidine (PEPCID) 20 MG tablet Take 20 mg by mouth daily.    Yes [provider]  FINASTERIDE PO Take  1 tablet by mouth every evening.   Yes [provider]  ketotifen (ZADITOR) 0.025 % ophthalmic solution Place 1 drop into both eyes daily as needed (dry eyes).    Yes [provider]  LORazepam (ATIVAN) 0.5 MG tablet Take 0.5 mg by mouth 2 (two) times daily as needed for anxiety.  08/22/16  Yes [provider]  PATADAY 0.2 % SOLN Place 1 drop into both eyes daily.  01/14/14  Yes [provider]  RA ASPIRIN ADULT LOW STRENGTH 81 MG EC tablet Take 1 tablet by mouth daily. 09/01/14  Yes [provider]  spironolactone (ALDACTONE) 25 MG tablet Take 0.5 tablets (12.5 mg total) by mouth daily. 05/29/18  Yes Larey Dresser, MD  torsemide (DEMADEX) 20 MG tablet Take 2 tablets (40 mg total) every other day alternating with 1 tablet (20 mg total) every other day. 05/21/18  Yes Larey Dresser, MD  NITROSTAT 0.4 MG SL tablet Place 0.4 mg under the tongue every 5 (five) minutes as needed for chest pain (MAX 3 TABLETS).  12/05/12   [provider]  oxyCODONE (OXY IR/ROXICODONE) 5 MG immediate release tablet Take 1 tablet (5 mg total) by mouth every 6 (six) hours as needed for moderate pain. Patient not taking: Reported on 06/25/2018 06/28/15   Jonetta Osgood, MD  tiZANidine (ZANAFLEX) 2 MG tablet Take 1 tablet (2 mg total) by mouth every 8 (eight) hours as needed for muscle spasms. Patient not taking: Reported on 06/25/2018 06/18/15   Gareth Morgan, MD    Scheduled Meds: . chlorhexidine  15 mL Mouth Rinse BID  . Chlorhexidine Gluconate Cloth  6 each Topical Daily  . insulin aspart  0-9 Units Subcutaneous TID WC  . mouth rinse  15 mL Mouth Rinse q12n4p  . metoprolol tartrate  2.5 mg Intravenous Q6H  . [START ON 07/02/2018] pantoprazole  40 mg Intravenous Q12H  . sodium chloride flush  10-40 mL Intracatheter Q12H   Continuous Infusions: . sodium chloride 10 mL/hr at 06/29/18 0200  . acetaminophen    . dextrose 5 % with KCl 20 mEq / L 20 mEq  (06/29/18 1033)  . pantoprazole (PROTONIX) IVPB    . pantoprozole (PROTONIX) infusion    . piperacillin-tazobactam (ZOSYN)  IV 3.375 g (06/29/18 0541)   PRN Meds:.sodium chloride, HYDROmorphone (DILAUDID) injection, ondansetron **OR** ondansetron (ZOFRAN) IV, sodium chloride flush  Allergies as of 06/25/2018  . (No Known Allergies)    Family History  Problem Relation Age of Onset  . Heart Problems Mother   . Diabetes Mother   . Heart Problems Brother     Social History   Socioeconomic History  . Marital status: Divorced    Spouse name: Not on file  . Number of children: Not on file  . Years of education: Not on file  . Highest education level: Not on file  Occupational History  . Occupation: Retired  Scientific laboratory technician  . Financial resource strain: Not on file  . Food insecurity:  Worry: Not on file    Inability: Not on file  . Transportation needs:    Medical: Not on file    Non-medical: Not on file  Tobacco Use  . Smoking status: Never Smoker  . Smokeless tobacco: Former Systems developer    Types: Chew  . Tobacco comment: no chew in over 3 years  Substance and Sexual Activity  . Alcohol use: Yes    Comment: "might take a drink during the holidays"  . Drug use: No  . Sexual activity: Never  Lifestyle  . Physical activity:    Days per week: Not on file    Minutes per session: Not on file  . Stress: Not on file  Relationships  . Social connections:    Talks on phone: Not on file    Gets together: Not on file    Attends religious service: Not on file    Active member of club or organization: Not on file    Attends meetings of clubs or organizations: Not on file    Relationship status: Not on file  . Intimate partner violence:    Fear of current or ex partner: Not on file    Emotionally abused: Not on file    Physically abused: Not on file    Forced sexual activity: Not on file  Other Topics Concern  . Not on file  Social History Narrative  . Not on file    Review  of Systems: All negative except as stated above in HPI.  Physical Exam: Vital signs: Vitals:   06/29/18 0600 06/29/18 0800  BP: (!) 132/44   Pulse: 71   Resp: (!) 21   Temp:  98.3 F (36.8 C)  SpO2: 97%    Last BM Date: (PTA) General:  Lethargic, elderly, frail, Well-developed, well-nourished, pleasant and cooperative in NAD Head: normocephalic, atraumatic Eyes: anicteric sclera ENT: cloudy bile with blood tinged in NG tube Neck: supple, nontender Lungs:  Clear throughout to auscultation.   No wheezes, crackles, or rhonchi. No acute distress. Heart:  Regular rate and rhythm; no murmurs, clicks, rubs,  or gallops. Abdomen: minimal tenderness; midline incision with wound vac in place; abd binder; decreased BS Rectal:  Deferred Ext: no edema  GI:  Lab Results: Recent Labs    06/27/18 0557 06/28/18 0547 06/29/18 0500  WBC 5.8 6.1 7.1  HGB 9.0* 8.3* 8.3*  HCT 27.7* 25.4* 25.1*  PLT 73* 71* 87*   BMET Recent Labs    06/28/18 0547 06/28/18 1952 06/29/18 0500  NA 141 140 142  K 3.3* 3.7 3.6  CL 109 110 112*  CO2 23 23 21*  GLUCOSE 179* 155* 185*  BUN 45* 47* 48*  CREATININE 2.12* 2.22* 2.18*  CALCIUM 8.5* 8.6* 8.7*   LFT Recent Labs    06/29/18 0500  PROT 5.8*  ALBUMIN 2.7*  AST 20  ALT 11  ALKPHOS 62  BILITOT 1.2   PT/INR No results for input(s): LABPROT, INR in the last 72 hours.   Studies/Results: No results found.  Impression/Plan: 84 yo s/p abdominal hernia surgery as stated above with blood in NGT this morning question ulcer vs NG trauma vs esophagitis/gastritis. EGD today to further evaluate. Continue Protonix drip. Supportive care.    LOS: 4 days   [name]  06/29/2018, 11:10 AM  Questions please call [phone]

## 2018-06-29 NOTE — Transfer of Care (Signed)
Immediate Anesthesia Transfer of Care Note  Patient: [name]  Procedure(s) Performed: ESOPHAGOGASTRODUODENOSCOPY (EGD) WITH PROPOFOL (N/A )  Patient Location: Endoscopy Unit  Anesthesia Type:MAC  Level of Consciousness: sedated and drowsy  Airway & Oxygen Therapy: Patient Spontanous Breathing and Patient connected to nasal cannula oxygen  Post-op Assessment: Report given to RN, Post -op Vital signs reviewed and stable and Patient moving all extremities  Post vital signs: Reviewed and stable  Last Vitals:  Vitals Value Taken Time  BP    Temp    Pulse    Resp    SpO2      Last Pain:  Vitals:   06/29/18 0800  TempSrc: Oral  PainSc:       Patients Stated Pain Goal: 2 (96/78/93 8101)  Complications: No apparent anesthesia complications

## 2018-06-29 NOTE — Anesthesia Preprocedure Evaluation (Addendum)
Anesthesia Evaluation  Patient identified by MRN, date of birth, ID band Patient awake    Reviewed: Allergy & Precautions, NPO status , Patient's Chart, lab work & pertinent test results  Airway Mallampati: II  TM Distance: >3 FB Neck ROM: Full    Dental no notable dental hx.    Pulmonary neg pulmonary ROS,    Pulmonary exam normal breath sounds clear to auscultation       Cardiovascular hypertension, Pt. on medications + CAD, + Cardiac Stents, + CABG and +CHF  Normal cardiovascular exam+ Cardiac Defibrillator  Rhythm:Regular Rate:Normal  EF 30-35%   Neuro/Psych negative neurological ROS  negative psych ROS   GI/Hepatic Neg liver ROS, GERD  ,  Endo/Other  diabetes  Renal/GU ARFRenal disease  negative genitourinary   Musculoskeletal negative musculoskeletal ROS (+)   Abdominal   Peds negative pediatric ROS (+)  Hematology  (+) Blood dyscrasia, anemia ,   Anesthesia Other Findings   Reproductive/Obstetrics negative OB ROS                             Lab Results  Component Value Date   WBC 7.1 06/29/2018   HGB 8.3 (L) 06/29/2018   HCT 25.1 (L) 06/29/2018   MCV 96.2 06/29/2018   PLT 87 (L) 06/29/2018   Lab Results  Component Value Date   CREATININE 2.18 (H) 06/29/2018   BUN 48 (H) 06/29/2018   NA 142 06/29/2018   K 3.6 06/29/2018   CL 112 (H) 06/29/2018   CO2 21 (L) 06/29/2018    Anesthesia Physical  Anesthesia Plan  ASA: IV  Anesthesia Plan: MAC   Post-op Pain Management:    Induction:   PONV Risk Score and Plan: 1 and Ondansetron, Treatment may vary due to age or medical condition and Propofol infusion  Airway Management Planned: Natural Airway and Nasal Cannula  Additional Equipment:   Intra-op Plan:   Post-operative Plan:   Informed Consent: I have reviewed the patients History and Physical, chart, labs and discussed the procedure including the risks,  benefits and alternatives for the proposed anesthesia with the patient or authorized representative who has indicated his/her understanding and acceptance.     Plan Discussed with: CRNA and Surgeon  Anesthesia Plan Comments:        Anesthesia Quick Evaluation

## 2018-06-29 NOTE — Progress Notes (Addendum)
3 Days Post-Op    CC: abdominal pain  Subjective: He is still confused, not sleeping very well at night.  No real change this AM.  NG irrigated and changed the filter, got about 100 from the NG.  No BS, Prevena in place.  Staff noted some PVC's, he has not been able to get his Coreg or IV lopressor because of his Low BP's.   IV Tylenol is the only pain med her is getting.  Objective: Vital signs in last 24 hours: Temp:  [97.6 F (36.4 C)-98.2 F (36.8 C)] 98.1 F (36.7 C) (10/25 0400) Pulse Rate:  [47-72] 71 (10/25 0600) Resp:  [13-21] 21 (10/25 0600) BP: (113-141)/(43-89) 132/44 (10/25 0600) SpO2:  [87 %-100 %] 97 % (10/25 0600) Weight:  [73.4 kg] 73.4 kg (10/25 0122) Last BM Date: (PTA) 2732 IV 550 urine recorded  Some loss after condom cath came off 270 NG recorded Afebrile, VSS Weight 26.08 Kg >> 27.76 Kg last PM K+ 3.6 Creatinine is stable at 2.18. CBC stable  Intake/Output from previous day: 10/24 0701 - 10/25 0700 In: 2732.5 [I.V.:1457.4; IV Piggyback:1275.1] Out: 820 [Urine:550; Emesis/NG output:270] Intake/Output this shift: Total I/O In: 1062.3 [I.V.:483.8; IV Piggyback:578.5] Out: 200 [Urine:200]3  General appearance: he is awake and cooperative, wants a glass of H2O, still confused.   Resp: clear anterior exam Cardio: paced with some PVC's.  GI: he is not distended, no BS, ? no flatus, no BM, prevena wound vac in place. Extremities: no edema  Lab Results:  Recent Labs    06/28/18 0547 06/29/18 0500  WBC 6.1 7.1  HGB 8.3* 8.3*  HCT 25.4* 25.1*  PLT 71* 87*    BMET Recent Labs    06/28/18 0547 06/29/18 0500  NA 141 142  K 3.3* 3.6  CL 109 112*  CO2 23 21*  GLUCOSE 179* 185*  BUN 45* 48*  CREATININE 2.12* 2.18*  CALCIUM 8.5* 8.7*   PT/INR Recent Labs    06/26/18 0809  LABPROT 12.5  INR 0.94    Recent Labs  Lab 06/25/18 2117 06/28/18 0547 06/29/18 0500  AST 19 20 20   ALT 11 11 11   ALKPHOS 96 44 62  BILITOT 1.2 0.9 1.2  PROT  7.6 5.7* 5.8*  ALBUMIN 4.1 2.6* 2.7*     Lipase     Component Value Date/Time   LIPASE 31 10/31/2014 0230     Medications: . chlorhexidine  15 mL Mouth Rinse BID  . Chlorhexidine Gluconate Cloth  6 each Topical Daily  . insulin aspart  0-9 Units Subcutaneous TID WC  . mouth rinse  15 mL Mouth Rinse q12n4p  . metoprolol tartrate  2.5 mg Intravenous Q6H  . sodium chloride flush  10-40 mL Intracatheter Q12H    Assessment/Plan  CAD/ischemic cardiomyopathy/Hx VT/AICD- Coreg Hx systolic congestive heart failure(EF 18%, grade 1 diastolic dysfunction) - Demadex/Spironolactone Hypertension Hyperlipidemia GERD Gout Hx diverticulosis Chronic kidney dz (baseline creatinine 1.7 -2.15)1.92 >> 2.11 Chronic anemia Thrombocytopenia  147 >> 73 >> 71 >> 87 Malnutrition - moderate Deconditioning Diabetes - Hemoglobin A1C - 7.3  Abdominal wall hernia with perforated viscus - abscess Exploratory laparotomy, resection of small bowel x2, lysis of adhesion x90 minutes, 06/26/2018,Dr. [name]  FEN: NPO/IV fluids  ID: Zosyn 10/21 =>>Day5 DVT: SCD - No starting heparin due to low platelets, starting to improve Foley: In  Follow up: Dr. Dema Severin  Plan:  From our standpoint we are just waiting on return of bowel function.  I would try  to get K+ up to the 4.0 range.  Will defer to Medicine. I placed holds on Lopressor, HR less than 70/SBP less than 100.  Will defer to Medicine on this also.  Also still off anticoagulant due to Platelets.      LOS: 4 days    Keene Gilkey 06/29/2018 [phone]

## 2018-06-30 ENCOUNTER — Other Ambulatory Visit: Payer: Self-pay

## 2018-06-30 DIAGNOSIS — N183 Chronic kidney disease, stage 3 unspecified: Secondary | ICD-10-CM

## 2018-06-30 DIAGNOSIS — K2900 Acute gastritis without bleeding: Secondary | ICD-10-CM

## 2018-06-30 DIAGNOSIS — K631 Perforation of intestine (nontraumatic): Secondary | ICD-10-CM

## 2018-06-30 DIAGNOSIS — K209 Esophagitis, unspecified without bleeding: Secondary | ICD-10-CM

## 2018-06-30 DIAGNOSIS — K210 Gastro-esophageal reflux disease with esophagitis: Secondary | ICD-10-CM

## 2018-06-30 DIAGNOSIS — K2970 Gastritis, unspecified, without bleeding: Secondary | ICD-10-CM

## 2018-06-30 LAB — GLUCOSE, CAPILLARY
GLUCOSE-CAPILLARY: 140 mg/dL — AB (ref 70–99)
GLUCOSE-CAPILLARY: 114 mg/dL — AB (ref 70–99)
Glucose-Capillary: 131 mg/dL — ABNORMAL HIGH (ref 70–99)
Glucose-Capillary: 141 mg/dL — ABNORMAL HIGH (ref 70–99)

## 2018-06-30 LAB — CBC WITH DIFFERENTIAL/PLATELET
ABS IMMATURE GRANULOCYTES: 0.07 10*3/uL (ref 0.00–0.07)
BASOS ABS: 0 10*3/uL (ref 0.0–0.1)
Basophils Relative: 0 %
Eosinophils Absolute: 0.2 10*3/uL (ref 0.0–0.5)
Eosinophils Relative: 3 %
HCT: 30.9 % — ABNORMAL LOW (ref 39.0–52.0)
Hemoglobin: 10 g/dL — ABNORMAL LOW (ref 13.0–17.0)
IMMATURE GRANULOCYTES: 1 %
Lymphocytes Relative: 7 %
Lymphs Abs: 0.4 10*3/uL — ABNORMAL LOW (ref 0.7–4.0)
MCH: 30.5 pg (ref 26.0–34.0)
MCHC: 32.4 g/dL (ref 30.0–36.0)
MCV: 94.2 fL (ref 80.0–100.0)
MONOS PCT: 7 %
Monocytes Absolute: 0.4 10*3/uL (ref 0.1–1.0)
NEUTROS ABS: 4.8 10*3/uL (ref 1.7–7.7)
NEUTROS PCT: 82 %
NRBC: 0.3 % — AB (ref 0.0–0.2)
PLATELETS: 90 10*3/uL — AB (ref 150–400)
RBC: 3.28 MIL/uL — ABNORMAL LOW (ref 4.22–5.81)
RDW: 15.7 % — ABNORMAL HIGH (ref 11.5–15.5)
WBC: 5.9 10*3/uL (ref 4.0–10.5)

## 2018-06-30 LAB — COMPREHENSIVE METABOLIC PANEL
ALBUMIN: 2.7 g/dL — AB (ref 3.5–5.0)
ALT: 12 U/L (ref 0–44)
AST: 19 U/L (ref 15–41)
Alkaline Phosphatase: 109 U/L (ref 38–126)
Anion gap: 9 (ref 5–15)
BUN: 46 mg/dL — AB (ref 8–23)
CHLORIDE: 112 mmol/L — AB (ref 98–111)
CO2: 20 mmol/L — AB (ref 22–32)
CREATININE: 2.32 mg/dL — AB (ref 0.61–1.24)
Calcium: 8.7 mg/dL — ABNORMAL LOW (ref 8.9–10.3)
GFR calc Af Amer: 27 mL/min — ABNORMAL LOW (ref >60)
GFR calc non Af Amer: 23 mL/min — ABNORMAL LOW (ref >60)
GLUCOSE: 158 mg/dL — AB (ref 70–99)
POTASSIUM: 3.7 mmol/L (ref 3.5–5.1)
SODIUM: 141 mmol/L (ref 135–145)
Total Bilirubin: 2 mg/dL — ABNORMAL HIGH (ref 0.3–1.2)
Total Protein: 6 g/dL — ABNORMAL LOW (ref 6.5–8.1)

## 2018-06-30 LAB — HEMOGLOBIN AND HEMATOCRIT, BLOOD
HCT: 31.8 % — ABNORMAL LOW (ref 39.0–52.0)
HEMATOCRIT: 30.4 % — AB (ref 39.0–52.0)
Hemoglobin: 9.9 g/dL — ABNORMAL LOW (ref 13.0–17.0)
Hemoglobin: 10.2 g/dL — ABNORMAL LOW (ref 13.0–17.0)

## 2018-06-30 LAB — MAGNESIUM: Magnesium: 2.4 mg/dL (ref 1.7–2.4)

## 2018-06-30 LAB — PHOSPHORUS: Phosphorus: 3.1 mg/dL (ref 2.5–4.6)

## 2018-06-30 MED ORDER — POTASSIUM CL IN DEXTROSE 5% 20 MEQ/L IV SOLN
20.0000 meq | INTRAVENOUS | Status: DC
  Administered 2018-06-30: 20 meq via INTRAVENOUS
  Filled 2018-06-30: qty 1000

## 2018-06-30 MED ORDER — FUROSEMIDE 10 MG/ML IJ SOLN
40.0000 mg | Freq: Once | INTRAMUSCULAR | Status: AC
  Administered 2018-06-30: 40 mg via INTRAVENOUS
  Filled 2018-06-30: qty 4

## 2018-06-30 NOTE — Evaluation (Signed)
Occupational Therapy Evaluation Patient Details Name: [name] MRN: [id number] DOB: [birth year] Today's Date: 06/30/2018    History of Present Illness Pt s/p exp lap with bowel resection on 06/26/18 and with hx of CAD, CHF, pacemaker and CABG   Clinical Impression   Pt admitted with above diagnoses. Pt presents with decreased strength and ability to complete functional mobility. This date pt presents very fatigued, needing multimodal cues to participate. When recalling history, pt friends present often correcting him and he changes his answers. Obtained additional hx from friends in room. Mod A +2 needed for all mobility this date to manage lines and for safety. Pt transferred chair > bed with mod A +2 using RW, condom cath becoming displaced in the transfer then pt becoming incontinent while sitting EOB. Total A needed to change socks, max A needed to change gown. Pt will need SNF level of care at d/c to address safety in functional transfers and BADL. Will continue to follow pt acutely.     Follow Up Recommendations  SNF    Equipment Recommendations  Other (comment)(defer to next venue)    Recommendations for Other Services       Precautions / Restrictions Precautions Precautions: Fall Precaution Comments: wound vac, abdominal binder, condom cath present Restrictions Weight Bearing Restrictions: No      Mobility Bed Mobility Overal bed mobility: Needs Assistance Bed Mobility: Rolling;Sit to Sidelying Rolling: Mod assist;+2 for physical assistance;+2 for safety/equipment Sidelying to sit: Mod assist;+2 for safety/equipment;+2 for physical assistance     Sit to sidelying: Mod assist;+2 for physical assistance;+2 for safety/equipment General bed mobility comments: cues for sequence with physical assist to control trunk and to manage LEs onto bed  Transfers Overall transfer level: Needs assistance Equipment used: Rolling walker (2 wheeled) Transfers: Sit to/from  Stand Sit to Stand: Mod assist;Max assist;+2 physical assistance;+2 safety/equipment;From elevated surface         General transfer comment: multimodal cues needed to achieve functional t/f    Balance Overall balance assessment: Needs assistance Sitting-balance support: Feet supported;Bilateral upper extremity supported Sitting balance-Leahy Scale: Fair     Standing balance support: Bilateral upper extremity supported Standing balance-Leahy Scale: Poor                             ADL either performed or assessed with clinical judgement   ADL Overall ADL's : Needs assistance/impaired Eating/Feeding: Total assistance;NPO Eating/Feeding Details (indicate cue type and reason): NGT Grooming: Moderate assistance;Sitting   Upper Body Bathing: Bed level;Maximal assistance   Lower Body Bathing: Total assistance;Bed level   Upper Body Dressing : Bed level;Maximal assistance   Lower Body Dressing: Total assistance;Bed level   Toilet Transfer: Total assistance Toilet Transfer Details (indicate cue type and reason): condom cath Toileting- Clothing Manipulation and Hygiene: Total assistance     Tub/Shower Transfer Details (indicate cue type and reason): N/T Functional mobility during ADLs: Moderate assistance;+2 for physical assistance;+2 for safety/equipment General ADL Comments: Focused session on functional mobility and functional transfers. Pt requiring mod A +2 for safety and lines using RW. increased fatigue this session. Pt incontinent during session, requiring total A clean up     Vision Baseline Vision/History: No visual deficits Patient Visual Report: No change from baseline       Perception     Praxis      Pertinent Vitals/Pain Pain Assessment: No/denies pain     Hand Dominance     Extremity/Trunk Assessment Upper  Extremity Assessment Upper Extremity Assessment: Generalized weakness(grossly 3-/5)   Lower Extremity Assessment Lower Extremity  Assessment: Generalized weakness       Communication Communication Communication: No difficulties   Cognition Arousal/Alertness: Awake/alert Behavior During Therapy: WFL for tasks assessed/performed Overall Cognitive Status: Within Functional Limits for tasks assessed                                     General Comments  increased edema noted in LUE    Exercises     Shoulder Instructions      Home Living Family/patient expects to be discharged to:: Private residence Living Arrangements: Alone Available Help at Discharge: Friend(s);Neighbor;Available PRN/intermittently Type of Home: Apartment Home Access: Level entry     Home Layout: One level     Bathroom Shower/Tub: Teacher, early years/pre: Standard Bathroom Accessibility: Yes   Home Equipment: Walker - 2 wheels;Walker - 4 wheels;Shower seat;Hand held shower head;Cane - single point          Prior Functioning/Environment Level of Independence: Needs assistance  Gait / Transfers Assistance Needed: RW used at baseline ADL's / Homemaking Assistance Needed: neighbor comes over to A with showers, gets meals on wheels            OT Problem List: Decreased strength;Impaired balance (sitting and/or standing);Cardiopulmonary status limiting activity;Decreased activity tolerance;Decreased knowledge of use of DME or AE      OT Treatment/Interventions: Self-care/ADL training;DME and/or AE instruction;Therapeutic activities;Balance training;Therapeutic exercise;Patient/family education    OT Goals(Current goals can be found in the care plan section) Acute Rehab OT Goals Patient Stated Goal: none stated OT Goal Formulation: With patient Time For Goal Achievement: 07/14/18 Potential to Achieve Goals: Good  OT Frequency: Min 2X/week   Barriers to D/C:            Co-evaluation PT/OT/SLP Co-Evaluation/Treatment: Yes Reason for Co-Treatment: For patient/therapist safety;To address  functional/ADL transfers PT goals addressed during session: Mobility/safety with mobility;Proper use of DME OT goals addressed during session: ADL's and self-care;Proper use of Adaptive equipment and DME      AM-PAC PT "6 Clicks" Daily Activity     Outcome Measure Help from another person eating meals?: Total(NGT) Help from another person taking care of personal grooming?: A Lot Help from another person toileting, which includes using toliet, bedpan, or urinal?: Total Help from another person bathing (including washing, rinsing, drying)?: Total Help from another person to put on and taking off regular upper body clothing?: A Lot Help from another person to put on and taking off regular lower body clothing?: Total 6 Click Score: 8   End of Session Equipment Utilized During Treatment: Gait belt;Rolling walker Nurse Communication: Mobility status;Other (comment)(to replace condom cath)  Activity Tolerance: Patient limited by fatigue Patient left: in bed;with call bell/phone within reach;with bed alarm set;with family/visitor present  OT Visit Diagnosis: Other abnormalities of gait and mobility (R26.89);Muscle weakness (generalized) (M62.81);Other symptoms and signs involving cognitive function                Time: 2542-7062 OT Time Calculation (min): 34 min Charges:  OT General Charges $OT Visit: 1 Visit OT Evaluation $OT Eval Moderate Complexity: 1 Mod OT Treatments $Self Care/Home Management : 8-22 mins  Zenovia Jarred, MSOT, OTR/L Behavioral Health OT/ Acute Relief OT [facility] Office: [phone]  Zenovia Jarred 06/30/2018, 1:20 PM

## 2018-06-30 NOTE — Progress Notes (Signed)
[facility] LIST [id number] 1927-05-04  CARE TEAM:  PCP: [name], MD  Outpatient Care Team: Patient Care Team: [name], MD as PCP - General (Internal Medicine) Larey Dresser, MD as PCP - Advanced Heart Failure (Cardiology) Evans Lance, MD as PCP - Electrophysiology (Cardiology) Charolette Forward, MD as Consulting Physician (Cardiology)  Inpatient Treatment Team: Treatment Team: Attending Provider: Edison Pace, Md, MD; Rounding Team: Garner Gavel, MD; Consulting Physician: Wilford Corner, MD; Occupational Therapist: Zenovia Jarred, OT; Registered Nurse: Suzzanne Cloud, RN; Physical Therapist: Mathis Fare, PT   Problem List:   Principal Problem:   Perforated small intestine s/p SB resection 06/26/2018 Active Problems:   Chronic systolic heart failure ([place])   ICD (implantable cardioverter-defibrillator) in place   CORONARY ARTERY BYPASS GRAFT, HX OF   DM type 2 causing vascular disease ([place])   Preoperative cardiovascular examination   Ischemic cardiomyopathy   Anemia of chronic disease   Hypertension   A-fib ([place])   Ventral incisional hernia   Small bowel ischemia ([place])   Thrombocytopenia ([place])   Hematemesis   06/26/2018   PRE-OPERATIVE DIAGNOSIS:  Perforated viscus, abdominal wall hernia  POST-OPERATIVE DIAGNOSIS:  Same  PROCEDURE:   1. Exploratory laparotomy 2. Resection of small bowel x2 3. Lysis of adhesions x 90 minutes  SURGEON:  Sharon [place], MD   Hospital Stay = 5 days  Assessment  Ileus s/p emergent SB resection & VWH repair  Plan:  -NGT for ileus after emergency surgery for small bowel perforation and incarcerated hernia.  - may be resolving - try NGT clamping trial.  May not fly, but reasonable to try and see how it goes.  Parenteral nutrition if does not open by postop day 7.  -PPI for esophagitis/gastritis per gastroenterology.  -Okay to transfer to floor from surgery standpoint.  Will defer to primary  medicine service, especially given cardiac and other issues.  -Follow electrolytes.  Stable for now  FEN: NPO/IV fluids  ID: Zosyn 10/21 =>>continue for 7 days DVT: SCD - No starting heparin due to low platelets, starting to improve Foley: In  Follow up: Dr. Dema Severin   06/30/2018    Subjective: (Chief complaint)  Patient feels a little better.  Has passed some gas.  Nursing and family in room.  Sitting up in chair.  Objective:  Vital signs:  Vitals:   06/30/18 0033 06/30/18 0200 06/30/18 0400 06/30/18 0600  BP: (!) 138/56 132/70 140/68 (!) 156/64  Pulse: 61 (!) 59 60 67  Resp: '13 14 12 14  '$ Temp: 98.6 F (37 C)  98.1 F (36.7 C)   TempSrc: Oral  Axillary   SpO2: 98% 97% 99% 97%  Weight:   75.7 kg 74.1 kg  Height:        Last BM Date: (PTA)  Intake/Output   Yesterday:  10/25 0701 - 10/26 0700 In: 2306.1 [I.V.:1378.7; Blood:380; IV Piggyback:547.5] Out: 1025 [Urine:725; Emesis/NG output:300] This shift:  No intake/output data recorded.  Bowel function:  Flatus: YES  BM:  No  Drain: Scant thick bilious fluid in nasogastric canister   Physical Exam:  General: Pt awake/alert/oriented x4 in no acute distress Eyes: PERRL, normal EOM.  Sclera clear.  No icterus Neuro: CN II-XII intact w/o focal sensory/motor deficits. Lymph: No head/neck/groin lymphadenopathy Psych:  No delerium/psychosis/paranoia HENT: Normocephalic, Mucus membranes moist.  No thrush.  G-tube in place.  Tends to mumble but somewhat comprehensible. Neck: Supple, No tracheal deviation Chest: No chest wall pain w good excursion CV:  Pulses intact.  Regular rhythm MS: Normal AROM mjr joints.  No obvious deformity  Abdomen: Somewhat firm.  Mildy distended.  Mildly tender at incisions only.  No evidence of peritonitis.  No incarcerated hernias.  Ext:  No deformity.  No mjr edema.  No cyanosis Skin: No petechiae / purpura  Results:   Labs: Results for orders placed or performed  during the hospital encounter of 06/25/18 (from the past 48 hour(s))  Glucose, capillary     Status: Abnormal   Collection Time: 06/28/18 12:06 PM  Result Value Ref Range   Glucose-Capillary 146 (H) 70 - 99 mg/dL   Comment 1 Notify RN    Comment 2 Document in Chart   Glucose, capillary     Status: Abnormal   Collection Time: 06/28/18  5:06 PM  Result Value Ref Range   Glucose-Capillary 150 (H) 70 - 99 mg/dL   Comment 1 Notify RN    Comment 2 Document in Chart   Basic metabolic panel     Status: Abnormal   Collection Time: 06/28/18  7:52 PM  Result Value Ref Range   Sodium 140 135 - 145 mmol/L   Potassium 3.7 3.5 - 5.1 mmol/L   Chloride 110 98 - 111 mmol/L   CO2 23 22 - 32 mmol/L   Glucose, Bld 155 (H) 70 - 99 mg/dL   BUN 47 (H) 8 - 23 mg/dL   Creatinine, Ser 2.22 (H) 0.61 - 1.24 mg/dL   Calcium 8.6 (L) 8.9 - 10.3 mg/dL   GFR calc non Af Amer 25 (L) >60 mL/min   GFR calc Af Amer 29 (L) >60 mL/min    Comment: (NOTE) The eGFR has been calculated using the CKD EPI equation. This calculation has not been validated in all clinical situations. eGFR's persistently <60 mL/min signify possible Chronic Kidney Disease.    Anion gap 7 5 - 15    Comment: Performed at [facility], [place] [address] [place], [place] [address]  Magnesium     Status: None   Collection Time: 06/28/18  7:52 PM  Result Value Ref Range   Magnesium 2.1 1.7 - 2.4 mg/dL    Comment: Performed at [facility], [place] [address] [place], [place] [address]  Glucose, capillary     Status: Abnormal   Collection Time: 06/28/18  9:44 PM  Result Value Ref Range   Glucose-Capillary 152 (H) 70 - 99 mg/dL  CBC with Differential/Platelet     Status: Abnormal   Collection Time: 06/29/18  5:00 AM  Result Value Ref Range   WBC 7.1 4.0 - 10.5 K/uL   RBC 2.61 (L) 4.22 - 5.81 MIL/uL   Hemoglobin 8.3 (L) 13.0 - 17.0 g/dL   HCT 25.1 (L) 39.0 - 52.0 %   MCV 96.2 80.0 - 100.0 fL   MCH 31.8 26.0  - 34.0 pg   MCHC 33.1 30.0 - 36.0 g/dL   RDW 15.2 11.5 - 15.5 %   Platelets 87 (L) 150 - 400 K/uL    Comment: Immature Platelet Fraction may be clinically indicated, consider ordering this additional test [id number]    nRBC 0.3 (H) 0.0 - 0.2 %   Neutrophils Relative % 85 %   Neutro Abs 6.0 1.7 - 7.7 K/uL   Lymphocytes Relative 5 %   Lymphs Abs 0.4 (L) 0.7 - 4.0 K/uL   Monocytes Relative 7 %   Monocytes Absolute 0.5 0.1 - 1.0 K/uL   Eosinophils Relative 2 %  Eosinophils Absolute 0.1 0.0 - 0.5 K/uL   Basophils Relative 0 %   Basophils Absolute 0.0 0.0 - 0.1 K/uL   Immature Granulocytes 1 %   Abs Immature Granulocytes 0.10 (H) 0.00 - 0.07 K/uL    Comment: Performed at [facility], [place] [address] [place], [place] [address]  Comprehensive metabolic panel     Status: Abnormal   Collection Time: 06/29/18  5:00 AM  Result Value Ref Range   Sodium 142 135 - 145 mmol/L   Potassium 3.6 3.5 - 5.1 mmol/L   Chloride 112 (H) 98 - 111 mmol/L   CO2 21 (L) 22 - 32 mmol/L   Glucose, Bld 185 (H) 70 - 99 mg/dL   BUN 48 (H) 8 - 23 mg/dL   Creatinine, Ser 2.18 (H) 0.61 - 1.24 mg/dL   Calcium 8.7 (L) 8.9 - 10.3 mg/dL   Total Protein 5.8 (L) 6.5 - 8.1 g/dL   Albumin 2.7 (L) 3.5 - 5.0 g/dL   AST 20 15 - 41 U/L   ALT 11 0 - 44 U/L   Alkaline Phosphatase 62 38 - 126 U/L   Total Bilirubin 1.2 0.3 - 1.2 mg/dL   GFR calc non Af Amer 25 (L) >60 mL/min   GFR calc Af Amer 29 (L) >60 mL/min    Comment: (NOTE) The eGFR has been calculated using the CKD EPI equation. This calculation has not been validated in all clinical situations. eGFR's persistently <60 mL/min signify possible Chronic Kidney Disease.    Anion gap 9 5 - 15    Comment: Performed at [facility] [place] [address], [place], [place] [address]  Magnesium     Status: None   Collection Time: 06/29/18  5:00 AM  Result Value Ref Range   Magnesium 2.1 1.7 - 2.4 mg/dL    Comment: Performed at [facility], [place] [address], [place], [place] [address]  Phosphorus     Status: None   Collection Time: 06/29/18  5:00 AM  Result Value Ref Range   Phosphorus 2.9 2.5 - 4.6 mg/dL    Comment: Performed at [facility], [place] [address] [place], [place] [address]  Glucose, capillary     Status: Abnormal   Collection Time: 06/29/18  7:36 AM  Result Value Ref Range   Glucose-Capillary 157 (H) 70 - 99 mg/dL  Glucose, capillary     Status: Abnormal   Collection Time: 06/29/18 11:36 AM  Result Value Ref Range   Glucose-Capillary 130 (H) 70 - 99 mg/dL  Hemoglobin and hematocrit, blood     Status: Abnormal   Collection Time: 06/29/18  2:00 PM  Result Value Ref Range   Hemoglobin 7.9 (L) 13.0 - 17.0 g/dL   HCT 24.9 (L) 39.0 - 52.0 %    Comment: Performed at [facility], [place] [address], [place], [place] [address]  Prepare RBC     Status: None   Collection Time: 06/29/18  2:58 PM  Result Value Ref Range   Order Confirmation      ORDER PROCESSED BY BLOOD BANK Performed at [facility], [place] [address] [place], [place] [address]   Hemoglobin and hematocrit, blood     Status: Abnormal   Collection Time: 06/29/18  3:26 PM  Result Value Ref Range   Hemoglobin 8.6 (L) 13.0 - 17.0 g/dL   HCT 26.6 (L) 39.0 - 52.0 %    Comment: Performed at [facility],  [place] [address] [place], [place] [address]  Glucose, capillary     Status: Abnormal   Collection Time: 06/29/18  4:32 PM  Result Value Ref Range   Glucose-Capillary 150 (H) 70 - 99 mg/dL   Comment 1 Notify RN    Comment 2 Document in Chart   Glucose, capillary     Status: Abnormal   Collection Time: 06/29/18  5:58 PM  Result Value Ref Range   Glucose-Capillary 163 (H) 70 - 99 mg/dL  Type and screen [place]     Status: None (Preliminary result)   Collection Time: 06/29/18  6:00 PM  Result Value Ref Range   ABO/RH(D) B POS    Antibody  Screen NEG    Sample Expiration 07/02/2018    Unit Number [id number]    Blood Component Type RED CELLS,LR    Unit division 00    Status of Unit ISSUED    Transfusion Status OK TO TRANSFUSE    Crossmatch Result      Compatible Performed at [place] [address] [place], [place] [address]   Glucose, capillary     Status: Abnormal   Collection Time: 06/29/18  9:49 PM  Result Value Ref Range   Glucose-Capillary 149 (H) 70 - 99 mg/dL  CBC with Differential/Platelet     Status: Abnormal   Collection Time: 06/30/18  2:54 AM  Result Value Ref Range   WBC 5.9 4.0 - 10.5 K/uL   RBC 3.28 (L) 4.22 - 5.81 MIL/uL   Hemoglobin 10.0 (L) 13.0 - 17.0 g/dL   HCT 30.9 (L) 39.0 - 52.0 %   MCV 94.2 80.0 - 100.0 fL   MCH 30.5 26.0 - 34.0 pg   MCHC 32.4 30.0 - 36.0 g/dL   RDW 15.7 (H) 11.5 - 15.5 %   Platelets 90 (L) 150 - 400 K/uL    Comment: REPEATED TO VERIFY Immature Platelet Fraction may be clinically indicated, consider ordering this additional test [id number] CONSISTENT WITH PREVIOUS RESULT    nRBC 0.3 (H) 0.0 - 0.2 %   Neutrophils Relative % 82 %   Neutro Abs 4.8 1.7 - 7.7 K/uL   Lymphocytes Relative 7 %   Lymphs Abs 0.4 (L) 0.7 - 4.0 K/uL   Monocytes Relative 7 %   Monocytes Absolute 0.4 0.1 - 1.0 K/uL   Eosinophils Relative 3 %   Eosinophils Absolute 0.2 0.0 - 0.5 K/uL   Basophils Relative 0 %   Basophils Absolute 0.0 0.0 - 0.1 K/uL   Immature Granulocytes 1 %   Abs Immature Granulocytes 0.07 0.00 - 0.07 K/uL    Comment: Performed at [facility], [place] [address], [place], [place] [address]  Comprehensive metabolic panel     Status: Abnormal   Collection Time: 06/30/18  2:54 AM  Result Value Ref Range   Sodium 141 135 - 145 mmol/L   Potassium 3.7 3.5 - 5.1 mmol/L   Chloride 112 (H) 98 - 111 mmol/L   CO2 20 (L) 22 - 32 mmol/L   Glucose, Bld 158 (H) 70 - 99 mg/dL   BUN 46 (H) 8 - 23 mg/dL   Creatinine, Ser 2.32 (H) 0.61 - 1.24  mg/dL   Calcium 8.7 (L) 8.9 - 10.3 mg/dL   Total Protein 6.0 (L) 6.5 - 8.1 g/dL   Albumin 2.7 (L) 3.5 - 5.0 g/dL   AST 19 15 - 41 U/L   ALT 12 0 - 44 U/L   Alkaline  Phosphatase 109 38 - 126 U/L   Total Bilirubin 2.0 (H) 0.3 - 1.2 mg/dL   GFR calc non Af Amer 23 (L) >60 mL/min   GFR calc Af Amer 27 (L) >60 mL/min    Comment: (NOTE) The eGFR has been calculated using the CKD EPI equation. This calculation has not been validated in all clinical situations. eGFR's persistently <60 mL/min signify possible Chronic Kidney Disease.    Anion gap 9 5 - 15    Comment: Performed at [facility], [place] [address], [place], [place] [address]  Magnesium     Status: None   Collection Time: 06/30/18  2:54 AM  Result Value Ref Range   Magnesium 2.4 1.7 - 2.4 mg/dL    Comment: Performed at [facility], [place] [address] [place], [place] [address]  Phosphorus     Status: None   Collection Time: 06/30/18  2:54 AM  Result Value Ref Range   Phosphorus 3.1 2.5 - 4.6 mg/dL    Comment: Performed at [facility], [place] [address] [place], [place] [address]  Hemoglobin and hematocrit, blood     Status: Abnormal   Collection Time: 06/30/18  3:26 AM  Result Value Ref Range   Hemoglobin 9.9 (L) 13.0 - 17.0 g/dL   HCT 30.4 (L) 39.0 - 52.0 %    Comment: Performed at [facility], [place] [address], [place], [place] [address]    Imaging / Studies: Dg Abd 1 View  Result Date: 06/29/2018 CLINICAL DATA:  84 year old male NG tube placement. EXAM: ABDOMEN - 1 VIEW COMPARISON:  CT Abdomen and Pelvis 06/25/2018. FINDINGS: Portable AP view at 1812 hours. Enteric tube in place with side hole in the midline below the diaphragm likely at or just beyond the gastroesophageal junction. Midline abdominal skin staples are new. Visible bowel gas pattern is within normal limits. Partially visible left chest AICD. Calcified aortic atherosclerosis. Grossly  negative lung bases. IMPRESSION: 1. Enteric tube placed to the stomach. Side hole could be at or just inside the GEJ. Advance 4-5 centimeters for more optimal placement. 2. Interval postoperative changes to the abdominal wall. Negative visible bowel gas pattern. Electronically Signed   By: Genevie Ann M.D.   On: 06/29/2018 18:51    Medications / Allergies: per chart  Antibiotics: Anti-infectives (From admission, onward)   Start     Dose/Rate Route Frequency Ordered Stop   06/26/18 1400  piperacillin-tazobactam (ZOSYN) IVPB 3.375 g     3.375 g 12.5 mL/hr over 240 Minutes Intravenous Every 8 hours 06/26/18 1218     06/26/18 0815  cefoTEtan (CEFOTAN) 2 g in sodium chloride 0.9 % 100 mL IVPB     2 g 200 mL/hr over 30 Minutes Intravenous On call to O.R. 06/26/18 0809 06/26/18 1103   06/26/18 0600  piperacillin-tazobactam (ZOSYN) IVPB 2.25 g  Status:  Discontinued     2.25 g 100 mL/hr over 30 Minutes Intravenous Every 6 hours 06/26/18 0447 06/26/18 1217   06/25/18 2345  piperacillin-tazobactam (ZOSYN) IVPB 3.375 g     3.375 g 100 mL/hr over 30 Minutes Intravenous  Once 06/25/18 2341 06/26/18 0031   06/25/18 2315  ceFEPIme (MAXIPIME) 2 g in sodium chloride 0.9 % 100 mL IVPB  Status:  Discontinued     2 g 200 mL/hr over 30 Minutes Intravenous  Once 06/25/18 2306 06/25/18 2341   06/25/18 2315  metroNIDAZOLE (FLAGYL) IVPB 500 mg  Status:  Discontinued     500  mg 100 mL/hr over 60 Minutes Intravenous Every 8 hours 06/25/18 2306 06/25/18 2341        Note: Portions of this report may have been transcribed using voice recognition software. Every effort was made to ensure accuracy; however, inadvertent computerized transcription errors may be present.   Any transcriptional errors that result from this process are unintentional.     Adin Hector, MD, FACS, MASCRS Gastrointestinal and Minimally Invasive Surgery    [address] [place], [place] [address] [phone] Main /  Paging [phone] Fax

## 2018-06-30 NOTE — Progress Notes (Signed)
Physical Therapy Treatment Patient Details Name: [name] MRN: [id number] DOB: Oct 17, 1926 Today's Date: 06/30/2018    History of Present Illness Pt s/p exp lap with bowel resection on 06/26/18 and with hx of CAD, CHF, pacemaker and CABG    PT Comments    Pt continues cooperative this date but with decreased activity tolerance 2* fatigue.   Follow Up Recommendations  SNF     Equipment Recommendations  None recommended by PT    Recommendations for Other Services       Precautions / Restrictions Precautions Precautions: Fall Precaution Comments: wound vac, abdominal binder, condom cath present Restrictions Weight Bearing Restrictions: No    Mobility  Bed Mobility Overal bed mobility: Needs Assistance Bed Mobility: Rolling;Sit to Sidelying Rolling: Mod assist;+2 for physical assistance;+2 for safety/equipment       Sit to sidelying: Mod assist;+2 for physical assistance;+2 for safety/equipment General bed mobility comments: cues for sequence with physical assist to control trunk and to manage LEs onto bed  Transfers Overall transfer level: Needs assistance Equipment used: Rolling walker (2 wheeled) Transfers: Sit to/from Stand Sit to Stand: Mod assist;Max assist;+2 physical assistance;+2 safety/equipment;From elevated surface         General transfer comment: cues for transition position and use of UEs to self assist;  physical assist to bring wt up and fwd and to balance in initial standing  Ambulation/Gait Ambulation/Gait assistance: Mod assist;+2 physical assistance;+2 safety/equipment Gait Distance (Feet): 5 Feet Assistive device: Rolling walker (2 wheeled) Gait Pattern/deviations: Step-to pattern;Step-through pattern;Decreased step length - right;Decreased step length - left;Shuffle;Trunk flexed Gait velocity: decr   General Gait Details: cues for posture and position from RW.  Physical assist for balance, support and RW management.  Distance ltd by  pt fatigue   Stairs             Wheelchair Mobility    Modified Rankin (Stroke Patients Only)       Balance Overall balance assessment: Needs assistance Sitting-balance support: Feet supported;Bilateral upper extremity supported Sitting balance-Leahy Scale: Fair     Standing balance support: Bilateral upper extremity supported Standing balance-Leahy Scale: Poor                              Cognition Arousal/Alertness: Awake/alert Behavior During Therapy: WFL for tasks assessed/performed Overall Cognitive Status: Within Functional Limits for tasks assessed                                        Exercises      General Comments        Pertinent Vitals/Pain Pain Assessment: No/denies pain    Home Living Family/patient expects to be discharged to:: Private residence Living Arrangements: Alone Available Help at Discharge: Friend(s);Neighbor;Available PRN/intermittently Type of Home: Apartment Home Access: Level entry   Home Layout: One level Home Equipment: Walker - 2 wheels;Walker - 4 wheels;Shower seat;Hand held Veterinary surgeon - single point      Prior Function Level of [place]: Needs assistance  Gait / Transfers Assistance Needed: RW used at baseline ADL's / Homemaking Assistance Needed: neighbor comes over to A with showers, gets meals on wheels     PT Goals (current goals can now be found in the care plan section) Acute Rehab PT Goals Patient Stated Goal: Regain IND PT Goal Formulation: With patient Time For Goal Achievement: 07/12/18 Potential  to Achieve Goals: Fair Progress towards PT goals: Not progressing toward goals - comment    Frequency    Min 3X/week      PT Plan Current plan remains appropriate    Co-evaluation   Reason for Co-Treatment: For patient/therapist safety;To address functional/ADL transfers PT goals addressed during session: Mobility/safety with mobility;Proper use of DME OT goals  addressed during session: ADL's and self-care;Proper use of Adaptive equipment and DME      AM-PAC PT "6 Clicks" Daily Activity  Outcome Measure  Difficulty turning over in bed (including adjusting bedclothes, sheets and blankets)?: Unable Difficulty moving from lying on back to sitting on the side of the bed? : Unable Difficulty sitting down on and standing up from a chair with arms (e.g., wheelchair, bedside commode, etc,.)?: Unable Help needed moving to and from a bed to chair (including a wheelchair)?: A Lot Help needed walking in hospital room?: A Lot Help needed climbing 3-5 steps with a railing? : Total 6 Click Score: 8    End of Session   Activity Tolerance: Patient limited by fatigue Patient left: in bed;with call bell/phone within reach;with family/visitor present Nurse Communication: Mobility status PT Visit Diagnosis: Muscle weakness (generalized) (M62.81);Difficulty in walking, not elsewhere classified (R26.2);Unsteadiness on feet (R26.81)     Time: 1840-3754 PT Time Calculation (min) (ACUTE ONLY): 38 min  Charges:  $Gait Training: 8-22 mins $Therapeutic Activity: 8-22 mins                     Debe Coder PT Acute Rehabilitation Services Pager [phone] Office [phone]    Faithe Ariola 06/30/2018, 1:09 PM

## 2018-06-30 NOTE — Progress Notes (Signed)
[facility]enterology Progress Note  [name] 84 y.o. 21-Jul-1927   Subjective: [name] tube clamped this morning. Nurses changing tape on NGT. Nonbloody stool just now.  Objective: Vital signs: Vitals:   06/30/18 1200 06/30/18 1400  BP: (!) 164/61 (!) 174/72  Pulse: 60 70  Resp: 12 15  Temp:    SpO2: 100% 100%  T 97.8  Physical Exam: Gen: elderly, frail, mild acute distress  CV: RRR Chest: Coarse breath sounds Abd: abdominal incision with wound vac in place, mild distention, mild tenderness, decreased BS Ext: no edema  Lab Results: Recent Labs    06/29/18 0500 06/30/18 0254  NA 142 141  K 3.6 3.7  CL 112* 112*  CO2 21* 20*  GLUCOSE 185* 158*  BUN 48* 46*  CREATININE 2.18* 2.32*  CALCIUM 8.7* 8.7*  MG 2.1 2.4  PHOS 2.9 3.1   Recent Labs    06/29/18 0500 06/30/18 0254  AST 20 19  ALT 11 12  ALKPHOS 62 109  BILITOT 1.2 2.0*  PROT 5.8* 6.0*  ALBUMIN 2.7* 2.7*   Recent Labs    06/29/18 0500  06/30/18 0254 06/30/18 0326 06/30/18 1000  WBC 7.1  --  5.9  --   --   NEUTROABS 6.0  --  4.8  --   --   HGB 8.3*   < > 10.0* 9.9* 10.2*  HCT 25.1*   < > 30.9* 30.4* 31.8*  MCV 96.2  --  94.2  --   --   PLT 87*  --  90*  --   --    < > = values in this interval not displayed.      Assessment/Plan: Upper GI bleed due to [name] trauma with esophageal ulcer and gastric erosions. Hgb stable at 10.2. No further bleeding. Great to see NGT clamped and he is tolerating thus far and hopefully surgery will remove tomorrow. Continue IV PPI. Will sign off. Call back if questions.   [name] 06/30/2018, 3:04 PM  Questions please call [phone] ID: [name], male   DOB: [birth year], 84 y.o.   MRN: [id number]

## 2018-06-30 NOTE — Progress Notes (Signed)
PROGRESS NOTE    [name]  VOZ:[id number] DOB: [birth year] DOA: 06/25/2018 PCP: [name], MD  Brief Narrative: The patient is an 84 year old male with history of CAD status post CABG, ischemic cardiomyopathy status post ICD placement, history of paroxysmal ventricular tachycardia who underwent a right upper quadrant abdominal pain that started yesterday afternoon.  CT abdomen showed a ventral hernia containing a perforated viscus with likely ischemic bowel and general surgery was consulted and admitted the patient to general surgery and medicine was consulted to help manage medical issues.  Cardiology did preoperative clearance and patient underwent surgery on 06/26/18.  This AM I was paged by Nurse after I saw the patient; as patient started having an Upper GIB and had bright red blood in NG. Surgery Deferred this to Medicine so I started the patient on a Protonix gtt and called GI. I also ordered 1 unit of pRBC to be transfused given drop in Hb/Hct.  The patient was found to have acute esophagitis bleeding as well as acute gastritis.  GI recommended n.p.o. and avoid NG suctioning if okay with surgery and recommended ideally removing the NG tube; NG Being clamped and hopefully removed tomorrow.  We will continue on Protonix drip for now and then change to IV PPI.  Assessment & Plan:   Principal Problem:   Perforated small intestine s/p SB resection 06/26/2018 Active Problems:   Chronic systolic heart failure ([place])   ICD (implantable cardioverter-defibrillator) in place   CORONARY ARTERY BYPASS GRAFT, HX OF   DM type 2 causing vascular disease ([place])   Preoperative cardiovascular examination   Ischemic cardiomyopathy   Anemia of chronic disease   Hypertension   GERD (gastroesophageal reflux disease)   A-fib (HCC)   Ventral incisional hernia   Small bowel ischemia (HCC)   Thrombocytopenia (HCC)   Hematemesis   CKD (chronic kidney disease) stage 3, GFR 30-59 ml/min (HCC)  Acute esophagitis   Gastritis  Ventral hernia with perforated viscus and possible ischemic bowel postoperative day 4 with exploratory laparotomy, resection of small bowel and lysis of adhesions -Continue n.p.o. With Ice Chips and NG tube until bowel function returns and is reliable; ideally would like to remove NG tube completely given his esophageal bleed and esophagitis but will defer this to general surgery; Currently undergoing Clamping Trial by General Surgery -Per General Surgery  -Continue with Empiric antibodies with IV Zosyn -Pain control per general surgery -Since patient is not eating IVF were changed to his D5W as his chloride started elevating and will continue for now -If his bowel function does not return within 7 days Post-Op then General Surgery considering parenteral nutrition  CAD status post CABG -Currently denies any chest pain -Cardiology was consulted for preoperative clearance  History of ischemic cardiomyopathy with a EF of 35% status post AICD placement -Cardiology consulted for preoperative cardiovascular clearance -Per cardiology continue beta-blocker and this was changed to IV metoprolol given that he is n.p.o. for Carvedilol -AICD was managed with a magnet prior to surgery -Cardiology checked an EKG -Patient was evaluated by heart failure Dr. Dr. Aundra Dubin and spironolactone was added  -Cardiology was a moderate risk for intermediate surgery given his underlying comorbidities and had no contraindication for planned surgery -Gently fluid hydration as patient is n.p.o. with D5W + KCL 20 mEQ at 50 mL per hour with close monitoring of volume status and will continue today    -Since Admission patient is + 6.150 L and will need strict I's and O's and daily  weights; Weight is up 11 lbs.  -Will give a dose of IV Lasix 40 mg to try to prevent volume overload as patient is not eating  -BNP was elevated at 433.0 however currently remains euvolemic  CKD stage  III/IV -Creatinine is not far from baseline -BUN/Cr went from 48/2.12 -> 46/2.32 -Avoid nephrotoxic medications possible but will give a dose of IV Lasix to try to prevent volume overload -Gentle maintenance IV fluid hydration again with D5W + 20 mEQ of KCl at 50 mL's per hour again today  -Continue monitor and trend renal function repeat CMP in a.m.  Anemia of Chronic Kidney Disease/Normocytic Anemia/Acute Blood Loss Anemia in the setting of Upper GIB from Esophagitis and Gastritis  -Patient's hemoglobin/hematocrit went from 12.0/36.3 and dropped to 7.9/24.9 -In the setting of Surgical Intervention and possible also dilutional drop given fluid resuscitation; now felt to be secondary to a GI bleed which is upper in nature -Continue monitor for signs and symptoms of bleeding -I started the patient on Protonix drip and have type and screen and transfuse 1 unit PRBCs; continue the Protonix drip and then changed to IV PPI after Protonix drip is running -Gastroenterology Dr. Wilford Corner has evaluated the patient and took the patient for an EGD and was found to be patchy moderate inflammation characterized by erosions and erythema along with esophagitis with bleeding throughout the entire esophagus. -Continue with Protonix drip and n.p.o. at this time and then changed to IV PPI after 72 hours -Continued monitor H&H's every 6 repeat H&H was stable at 10.2/31.8 -Per GI recommendations ideally would like to remove NG tube but this will be deferred to general surgery as patient has had surgical intervention in his abdomen -GI signed off the case  Thrombocytopenia -Patient's platelet count dropped from 147 is now 90 -Continue monitor for signs of bleeding -Repeat CBC in a.m.  Diabetes mellitus type 2 -Hemoglobin A1c was 7.3 -This on sensitive NovoLog sliding scale insulin before meals -Continue monitor CBGs; blood glucose has been ranging from 114-163 -We will consult diabetes coronary for  further evaluation recommendations  Hypokalemia -Patient's potassium this morning was 3.7 -Continue monitor replete as necessary -Repeat CMP in a.m.  DVT prophylaxis: Per General Surgery; SCDs and chemical DVT prophylaxis when not bleeding  Code Status: FULL CODE Family Communication: Discussed with Family at bedside Disposition Plan: Per General Surgery; Have Consulted Social worker for SNF placement  Consultants:   [facility]   General Surgery  Cardiology  Gastroenterology    Procedures: Done by Dr. [name] and Dr. Excell Seltzer 1. Exploratory laparotomy 2. Resection of small bowel x2 3. Lysis of adhesions x 90 minutes  EGD on 06/29/18 Findings:      LA Grade D (one or more mucosal breaks involving at least 75% of       esophageal circumference) esophagitis with bleeding was found in the       entire esophagus.      The Z-line was found 40 cm from the incisors.      Patchy moderate inflammation characterized by erosions and erythema was       found in the gastric fundus and in the gastric body.      Linear areas of erosions noted likely due to NG suction trauma.      The examined duodenum was normal (also clear bile seen). Impression:               - LA Grade D acute esophagitis.                           -  Z-line, 40 cm from the incisors.                           - Acute gastritis.                           - Normal examined duodenum.                           - No specimens collected.   Antimicrobials:  Anti-infectives (From admission, onward)   Start     Dose/Rate Route Frequency Ordered Stop   06/26/18 1400  piperacillin-tazobactam (ZOSYN) IVPB 3.375 g     3.375 g 12.5 mL/hr over 240 Minutes Intravenous Every 8 hours 06/26/18 1218     06/26/18 0815  cefoTEtan (CEFOTAN) 2 g in sodium chloride 0.9 % 100 mL IVPB     2 g 200 mL/hr over 30 Minutes Intravenous On call to O.R. 06/26/18 0809 06/26/18 1103   06/26/18 0600  piperacillin-tazobactam (ZOSYN)  IVPB 2.25 g  Status:  Discontinued     2.25 g 100 mL/hr over 30 Minutes Intravenous Every 6 hours 06/26/18 0447 06/26/18 1217   06/25/18 2345  piperacillin-tazobactam (ZOSYN) IVPB 3.375 g     3.375 g 100 mL/hr over 30 Minutes Intravenous  Once 06/25/18 2341 06/26/18 0031   06/25/18 2315  ceFEPIme (MAXIPIME) 2 g in sodium chloride 0.9 % 100 mL IVPB  Status:  Discontinued     2 g 200 mL/hr over 30 Minutes Intravenous  Once 06/25/18 2306 06/25/18 2341   06/25/18 2315  metroNIDAZOLE (FLAGYL) IVPB 500 mg  Status:  Discontinued     500 mg 100 mL/hr over 60 Minutes Intravenous Every 8 hours 06/25/18 2306 06/25/18 2341     Subjective: Seen and examined at bedside and was sitting in the chair and states that he was having some flatulence and states that his NG tube stopped bleeding yesterday afternoon.  No nausea or vomiting.  Denied chest pain or lightheadedness or dizziness.  Objective: Vitals:   06/30/18 0800 06/30/18 1000 06/30/18 1200 06/30/18 1400  BP: (!) 166/68 (!) 176/81 (!) 164/61 (!) 174/72  Pulse: 61 60 60 70  Resp: 14 17 12 15   Temp: 97.8 F (36.6 C)  97.6 F (36.4 C)   TempSrc: Oral  Oral   SpO2: 100% 97% 100% 100%  Weight:      Height:        Intake/Output Summary (Last 24 hours) at 06/30/2018 1621 Last data filed at 06/30/2018 1500 Gross per 24 hour  Intake 2398.24 ml  Output 1225 ml  Net 1173.24 ml   Filed Weights   06/29/18 0122 06/30/18 0400 06/30/18 0600  Weight: 73.4 kg 75.7 kg 74.1 kg   Examination: Physical Exam  Constitutional: Patient is an elderly African-American male is currently no acute distress appears calm and is sitting in the chair bedside this morning.  Does appear somewhat uncomfortable though Eyes: Sclera anicteric.  Lids and conjunctive are normal.  Has arcus senilis ENMT: External ears and nose appear normal.  Grossly normal hearing.  NG tube in place but is currently being clamped Neck: Appears supple no JVD.  Has a right IJ  catheter Respiratory: Diminished to auscultation bilaterally no appreciable wheezing, rales, rhonchi.  Patient not tachypneic wheezing excess muscle breathe Cardiovascular: Regular rate and rhythm.  No appreciable murmurs, rubs or gallops.  Has 1+  lower extremity edema with Abdomen: Soft, slightly tender.  Has an abdominal binder present.  Bowel sounds are diminished GU: Deferred Musculoskeletal: No contractures or cyanosis.  No joint deformities noted Skin: No appreciable rashes or lesions limited skin evaluation.  Has a wound VAC on his abdominal incision and has abdominal binder Neurologic: Renal nerves II through XII grossly intact with no appreciable focal deficits Psychiatric: Normal judgment and insight.  Patient is awake, alert and oriented x3  Data Reviewed: I have personally reviewed following labs and imaging studies  CBC: Recent Labs  Lab 06/25/18 2117 06/27/18 0557 06/28/18 0547 06/29/18 0500 06/29/18 1400 06/29/18 1526 06/30/18 0254 06/30/18 0326 06/30/18 1000  WBC 9.0 5.8 6.1 7.1  --   --  5.9  --   --   NEUTROABS  --   --  5.1 6.0  --   --  4.8  --   --   HGB 12.0* 9.0* 8.3* 8.3* 7.9* 8.6* 10.0* 9.9* 10.2*  HCT 36.3* 27.7* 25.4* 25.1* 24.9* 26.6* 30.9* 30.4* 31.8*  MCV 94.0 97.5 95.1 96.2  --   --  94.2  --   --   PLT 147* 73* 71* 87*  --   --  90*  --   --    Basic Metabolic Panel: Recent Labs  Lab 06/26/18 0809 06/27/18 0557 06/28/18 0547 06/28/18 1952 06/29/18 0500 06/30/18 0254  NA 134* 136 141 140 142 141  K 4.4 4.0 3.3* 3.7 3.6 3.7  CL 98 103 109 110 112* 112*  CO2 25 22 23 23  21* 20*  GLUCOSE 181* 162* 179* 155* 185* 158*  BUN 65* 52* 45* 47* 48* 46*  CREATININE 2.10* 2.11* 2.12* 2.22* 2.18* 2.32*  CALCIUM 8.9 8.2* 8.5* 8.6* 8.7* 8.7*  MG 2.1  --  2.0 2.1 2.1 2.4  PHOS  --   --  2.7  --  2.9 3.1   GFR: Estimated Creatinine Clearance: 19.9 mL/min (A) (by C-G formula based on SCr of 2.32 mg/dL (H)). Liver Function Tests: Recent Labs  Lab  06/25/18 2117 06/28/18 0547 06/29/18 0500 06/30/18 0254  AST 19 20 20 19   ALT 11 11 11 12   ALKPHOS 96 44 62 109  BILITOT 1.2 0.9 1.2 2.0*  PROT 7.6 5.7* 5.8* 6.0*  ALBUMIN 4.1 2.6* 2.7* 2.7*   No results for input(s): LIPASE, AMYLASE in the last 168 hours. No results for input(s): AMMONIA in the last 168 hours. Coagulation Profile: Recent Labs  Lab 06/26/18 0809  INR 0.94   Cardiac Enzymes: Recent Labs  Lab 06/26/18 0809  TROPONINI 0.04*   BNP (last 3 results) No results for input(s): PROBNP in the last 8760 hours. HbA1C: No results for input(s): HGBA1C in the last 72 hours. CBG: Recent Labs  Lab 06/29/18 1632 06/29/18 1758 06/29/18 2149 06/30/18 0832 06/30/18 1307  GLUCAP 150* 163* 149* 140* 114*   Lipid Profile: No results for input(s): CHOL, HDL, LDLCALC, TRIG, CHOLHDL, LDLDIRECT in the last 72 hours. Thyroid Function Tests: No results for input(s): TSH, T4TOTAL, FREET4, T3FREE, THYROIDAB in the last 72 hours. Anemia Panel: No results for input(s): VITAMINB12, FOLATE, FERRITIN, TIBC, IRON, RETICCTPCT in the last 72 hours. Sepsis Labs: No results for input(s): PROCALCITON, LATICACIDVEN in the last 168 hours.  Recent Results (from the past 240 hour(s))  MRSA PCR Screening     Status: None   Collection Time: 06/26/18  1:48 AM  Result Value Ref Range Status   MRSA by PCR NEGATIVE NEGATIVE Final  Comment:        The GeneXpert MRSA Assay (FDA approved for NASAL specimens only), is one component of a comprehensive MRSA colonization surveillance program. It is not intended to diagnose MRSA infection nor to guide or monitor treatment for MRSA infections. Performed at [facility], [place] [address], [place], [place] [address]     Radiology Studies: Dg Abd 1 View  Result Date: 06/29/2018 CLINICAL DATA:  84 year old male NG tube placement. EXAM: ABDOMEN - 1 VIEW COMPARISON:  CT Abdomen and Pelvis 06/25/2018. FINDINGS: Portable AP view  at 1812 hours. Enteric tube in place with side hole in the midline below the diaphragm likely at or just beyond the gastroesophageal junction. Midline abdominal skin staples are new. Visible bowel gas pattern is within normal limits. Partially visible left chest AICD. Calcified aortic atherosclerosis. Grossly negative lung bases. IMPRESSION: 1. Enteric tube placed to the stomach. Side hole could be at or just inside the GEJ. Advance 4-5 centimeters for more optimal placement. 2. Interval postoperative changes to the abdominal wall. Negative visible bowel gas pattern. Electronically Signed   By: Genevie Ann M.D.   On: 06/29/2018 18:51   Scheduled Meds: . chlorhexidine  15 mL Mouth Rinse BID  . Chlorhexidine Gluconate Cloth  6 each Topical Daily  . insulin aspart  0-9 Units Subcutaneous TID WC  . mouth rinse  15 mL Mouth Rinse q12n4p  . metoprolol tartrate  2.5 mg Intravenous Q6H  . [START ON 07/02/2018] pantoprazole  40 mg Intravenous Q12H  . sodium chloride flush  10-40 mL Intracatheter Q12H   Continuous Infusions: . sodium chloride Stopped (06/29/18 1033)  . pantoprozole (PROTONIX) infusion 8 mg/hr (06/30/18 1500)  . piperacillin-tazobactam (ZOSYN)  IV 3.375 g (06/30/18 1533)    LOS: 5 days   Kerney Elbe, DO [facility] PAGER is on [place]  If 7PM-7AM, please contact night-coverage [email or web address] Password [email or web address] 06/30/2018, 4:21 PM

## 2018-06-30 NOTE — Progress Notes (Signed)
NG tube clamped per MD order, educated to notify this RN if any pain or nausea develops.

## 2018-07-01 ENCOUNTER — Inpatient Hospital Stay (HOSPITAL_COMMUNITY): Payer: Medicare Other

## 2018-07-01 DIAGNOSIS — E872 Acidosis: Secondary | ICD-10-CM

## 2018-07-01 LAB — CBC WITH DIFFERENTIAL/PLATELET
ABS IMMATURE GRANULOCYTES: 0.09 10*3/uL — AB (ref 0.00–0.07)
BASOS PCT: 0 %
Basophils Absolute: 0 10*3/uL (ref 0.0–0.1)
Eosinophils Absolute: 0.1 10*3/uL (ref 0.0–0.5)
Eosinophils Relative: 2 %
HCT: 31.3 % — ABNORMAL LOW (ref 39.0–52.0)
Hemoglobin: 10.2 g/dL — ABNORMAL LOW (ref 13.0–17.0)
IMMATURE GRANULOCYTES: 1 %
Lymphocytes Relative: 7 %
Lymphs Abs: 0.4 10*3/uL — ABNORMAL LOW (ref 0.7–4.0)
MCH: 31.1 pg (ref 26.0–34.0)
MCHC: 32.6 g/dL (ref 30.0–36.0)
MCV: 95.4 fL (ref 80.0–100.0)
Monocytes Absolute: 0.4 10*3/uL (ref 0.1–1.0)
Monocytes Relative: 6 %
NEUTROS ABS: 5.3 10*3/uL (ref 1.7–7.7)
NEUTROS PCT: 84 %
PLATELETS: 116 10*3/uL — AB (ref 150–400)
RBC: 3.28 MIL/uL — AB (ref 4.22–5.81)
RDW: 16.4 % — ABNORMAL HIGH (ref 11.5–15.5)
WBC: 6.3 10*3/uL (ref 4.0–10.5)
nRBC: 0 % (ref 0.0–0.2)

## 2018-07-01 LAB — COMPREHENSIVE METABOLIC PANEL
ALT: 13 U/L (ref 0–44)
AST: 16 U/L (ref 15–41)
Albumin: 2.6 g/dL — ABNORMAL LOW (ref 3.5–5.0)
Alkaline Phosphatase: 85 U/L (ref 38–126)
Anion gap: 12 (ref 5–15)
BUN: 43 mg/dL — AB (ref 8–23)
CHLORIDE: 114 mmol/L — AB (ref 98–111)
CO2: 18 mmol/L — ABNORMAL LOW (ref 22–32)
CREATININE: 2.12 mg/dL — AB (ref 0.61–1.24)
Calcium: 8.8 mg/dL — ABNORMAL LOW (ref 8.9–10.3)
GFR calc Af Amer: 30 mL/min — ABNORMAL LOW (ref >60)
GFR calc non Af Amer: 26 mL/min — ABNORMAL LOW (ref >60)
Glucose, Bld: 195 mg/dL — ABNORMAL HIGH (ref 70–99)
Potassium: 3.8 mmol/L (ref 3.5–5.1)
SODIUM: 144 mmol/L (ref 135–145)
Total Bilirubin: 1.8 mg/dL — ABNORMAL HIGH (ref 0.3–1.2)
Total Protein: 6.1 g/dL — ABNORMAL LOW (ref 6.5–8.1)

## 2018-07-01 LAB — PHOSPHORUS: PHOSPHORUS: 3.1 mg/dL (ref 2.5–4.6)

## 2018-07-01 LAB — GLUCOSE, CAPILLARY
GLUCOSE-CAPILLARY: 148 mg/dL — AB (ref 70–99)
Glucose-Capillary: 187 mg/dL — ABNORMAL HIGH (ref 70–99)
Glucose-Capillary: 134 mg/dL — ABNORMAL HIGH (ref 70–99)
Glucose-Capillary: 146 mg/dL — ABNORMAL HIGH (ref 70–99)

## 2018-07-01 LAB — MAGNESIUM: Magnesium: 2.1 mg/dL (ref 1.7–2.4)

## 2018-07-01 MED ORDER — SACCHAROMYCES BOULARDII 250 MG PO CAPS
250.0000 mg | ORAL_CAPSULE | Freq: Two times a day (BID) | ORAL | Status: DC
  Administered 2018-07-01 – 2018-07-04 (×6): 250 mg via ORAL
  Filled 2018-07-01 (×6): qty 1

## 2018-07-01 MED ORDER — PSYLLIUM 95 % PO PACK
1.0000 | PACK | Freq: Every day | ORAL | Status: DC
  Administered 2018-07-03 – 2018-07-04 (×2): 1 via ORAL
  Filled 2018-07-01 (×4): qty 1

## 2018-07-01 MED ORDER — FUROSEMIDE 10 MG/ML IJ SOLN
40.0000 mg | Freq: Once | INTRAMUSCULAR | Status: AC
  Administered 2018-07-01: 40 mg via INTRAVENOUS
  Filled 2018-07-01: qty 4

## 2018-07-01 MED ORDER — BISACODYL 10 MG RE SUPP: 10.0000 mg | Freq: Two times a day (BID) | RECTAL | Status: DC | PRN

## 2018-07-01 MED ORDER — SODIUM BICARBONATE 8.4 % IV SOLN
INTRAVENOUS | Status: AC
  Administered 2018-07-01: 09:00:00 via INTRAVENOUS
  Filled 2018-07-01: qty 150

## 2018-07-01 NOTE — Progress Notes (Signed)
Patient had 7runs of V-tach, denies any chest pain/distress. Vital- 146/58, 59, 97%-ra. Dr. Alfredia Ferguson notified, no new order given, will continue to assess patient.

## 2018-07-01 NOTE — Progress Notes (Signed)
PROGRESS NOTE    [name]  RJJ:[id number] DOB: [birth year] DOA: 06/25/2018 PCP: [name], MD  Brief Narrative: The patient is an 84 year old male with history of CAD status post CABG, ischemic cardiomyopathy status post ICD placement, history of paroxysmal ventricular tachycardia who underwent a right upper quadrant abdominal pain that started yesterday afternoon.  CT abdomen showed a ventral hernia containing a perforated viscus with likely ischemic bowel and general surgery was consulted and admitted the patient to general surgery and medicine was consulted to help manage medical issues.  Cardiology did preoperative clearance and patient underwent surgery on 06/26/18.  On the AM of 06/29/18 I was paged by Nurse after I saw the patient; as patient started having an Upper GIB and had bright red blood in NG. Surgery Deferred this to Medicine so I started the patient on a Protonix gtt and called GI. I also ordered 1 unit of pRBC to be transfused given drop in Hb/Hct.  The patient was found to have acute esophagitis bleeding as well as acute gastritis.  GI recommended n.p.o. and avoid NG suctioning if okay with surgery and recommended ideally removing the NG tube; NGT was clamped and removed today as patient is starting to pass flatus and had 3 bowel movements. Patient was started on CLD.  We will continue on Protonix drip for now and then change to IV PPI.  His IV fluids were changed and he was started on a bicarbonate drip.  He received a dose of IV Lasix yesterday and may consider giving another one today.  Assessment & Plan:   Principal Problem:   Perforated small intestine s/p SB resection 06/26/2018 Active Problems:   Chronic systolic heart failure (HCC)   ICD (implantable cardioverter-defibrillator) in place   CORONARY ARTERY BYPASS GRAFT, HX OF   DM type 2 causing vascular disease ([place])   Preoperative cardiovascular examination   Ischemic cardiomyopathy   Anemia of chronic  disease   Hypertension   GERD (gastroesophageal reflux disease)   A-fib (HCC)   Ventral incisional hernia   Small bowel ischemia (HCC)   Thrombocytopenia (HCC)   Hematemesis   CKD (chronic kidney disease) stage 3, GFR 30-59 ml/min (HCC)   Acute esophagitis   Gastritis  Ventral hernia with perforated viscus and possible ischemic bowel postoperative day 5 with exploratory laparotomy, resection of small bowel and lysis of adhesions -NG tube was clamped and removed and patient was started on a clear liquid diet by general surgery  -Per General Surgery  -Continue with Empiric antibodies with IV Zosyn -Pain control per general surgery -Patient's IV fluids were changed to sodium bicarbonate 150 mEq at 50 mils per hour given his metabolic acidosis -If his bowel function does not return within 7 days Post-Op then General Surgery considering parenteral nutrition however this is less likely cases his diet has been advanced  CAD status post CABG -Currently denies any chest pain -Cardiology was consulted for preoperative clearance  History of ischemic cardiomyopathy with a EF of 35% status post AICD placement -Cardiology consulted for preoperative cardiovascular clearance -Per cardiology continue beta-blocker and this was changed to IV metoprolol given that he is n.p.o. for Carvedilol -AICD was managed with a magnet prior to surgery -Cardiology checked an EKG -Patient was evaluated by heart failure Dr. Dr. Aundra Dubin and spironolactone was added  -Cardiology was a moderate risk for intermediate surgery given his underlying comorbidities and had no contraindication for planned surgery -Gently fluid hydration as patient is n.p.o. with D5W + KCL  20 mEQ at 50 mL per hour with close monitoring of volume status and will continue today    -Since Admission patient is + 6.552 L and will need strict I's and O's and daily weights; Weight is up 7 lbs.  -Will give another dose of IV Lasix 40 mg to try to prevent  volume overload as patient is just on clear liquids now -BNP was elevated at 433.0 however currently remains euvolemic  CKD stage III/IV -Creatinine is not far from baseline -BUN/Cr went from 48/2.12 -> 46/2.32 -> 43/2.12 -Avoid nephrotoxic medications possible but will give another dose of IV Lasix to try to prevent volume overload -Gentle maintenance IV fluid hydration again with D5W + 20 mEQ of KCl at 50 mL's changed to Sodium Bicarbonate gtt with 150 mEQ at 50 mL/hr -Continue monitor and trend renal function repeat CMP in a.m.  Anemia of Chronic Kidney Disease/Normocytic Anemia/Acute Blood Loss Anemia in the setting of Upper GIB from Esophagitis and Gastritis  -Patient's hemoglobin/hematocrit went from 12.0/36.3 and dropped to 7.9/24.9 -In the setting of Surgical Intervention and possible also dilutional drop given fluid resuscitation; now felt to be secondary to a GI bleed which is upper in nature -Continue monitor for signs and symptoms of bleeding -I started the patient on Protonix drip and have type and screen and transfuse 1 unit PRBCs; continue the Protonix drip and then changed to IV PPI after Protonix drip is finished running later today  -Gastroenterology Dr. Wilford Corner has evaluated the patient and took the patient for an EGD and was found to be patchy moderate inflammation characterized by erosions and erythema along with esophagitis with bleeding throughout the entire esophagus. -Continue with Protonix drip and n.p.o. at this time and then changed to IV PPI later today  -Continued monitor H&H's every 6 repeat H&H was stable at 10.2/31.3 -NG tube is been removed -GI signed off the case  Thrombocytopenia -Patient's platelet count dropped from 147 is now 116 -Continue monitor for signs of bleeding -Repeat CBC in a.m.  Diabetes mellitus type 2 -Hemoglobin A1c was 7.3 -This on sensitive NovoLog sliding scale insulin before meals -Continue monitor CBGs; blood glucose has  been ranging from 131-187 -We will consult diabetes coronary for further evaluation recommendations  Hypokalemia -Patient's potassium this morning was 3.8 -Continue monitor replete as necessary -Repeat CMP in a.m.  Hyperchloremia, Non-gap metabolic acidosis -Patient's chloride level this morning was 114 and bicarbonate dropped down to 18 -IV fluids have been changed and patient was placed on sodium bicarbonate drip with 150 mEq at 50 mils per hour -We will continue at this time -Patient was also given a dose of IV Lasix to try and prevent volume overload given his heart failure history  DVT prophylaxis: Per General Surgery; SCDs and chemical DVT prophylaxis when not bleeding  Code Status: FULL CODE Family Communication: Discussed with Family at bedside Disposition Plan: Per General Surgery; okay from medicine standpoint to transfer to medical floor; Have Consulted Social worker for SNF placement  Consultants:   [facility]  Cardiology  Gastroenterology    Procedures: Done by Dr. [name] and Dr. Excell Seltzer 1. Exploratory laparotomy 2. Resection of small bowel x2 3. Lysis of adhesions x 90 minutes  EGD on 06/29/18 Findings:      LA Grade D (one or more mucosal breaks involving at least 75% of       esophageal circumference) esophagitis with bleeding was found in the  entire esophagus.      The Z-line was found 40 cm from the incisors.      Patchy moderate inflammation characterized by erosions and erythema was       found in the gastric fundus and in the gastric body.      Linear areas of erosions noted likely due to NG suction trauma.      The examined duodenum was normal (also clear bile seen). Impression:               - LA Grade D acute esophagitis.                           - Z-line, 40 cm from the incisors.                           - Acute gastritis.                           - Normal examined duodenum.                           -  No specimens collected.   Antimicrobials:  Anti-infectives (From admission, onward)   Start     Dose/Rate Route Frequency Ordered Stop   06/26/18 1400  piperacillin-tazobactam (ZOSYN) IVPB 3.375 g     3.375 g 12.5 mL/hr over 240 Minutes Intravenous Every 8 hours 06/26/18 1218     06/26/18 0815  cefoTEtan (CEFOTAN) 2 g in sodium chloride 0.9 % 100 mL IVPB     2 g 200 mL/hr over 30 Minutes Intravenous On call to O.R. 06/26/18 0809 06/26/18 1103   06/26/18 0600  piperacillin-tazobactam (ZOSYN) IVPB 2.25 g  Status:  Discontinued     2.25 g 100 mL/hr over 30 Minutes Intravenous Every 6 hours 06/26/18 0447 06/26/18 1217   06/25/18 2345  piperacillin-tazobactam (ZOSYN) IVPB 3.375 g     3.375 g 100 mL/hr over 30 Minutes Intravenous  Once 06/25/18 2341 06/26/18 0031   06/25/18 2315  ceFEPIme (MAXIPIME) 2 g in sodium chloride 0.9 % 100 mL IVPB  Status:  Discontinued     2 g 200 mL/hr over 30 Minutes Intravenous  Once 06/25/18 2306 06/25/18 2341   06/25/18 2315  metroNIDAZOLE (FLAGYL) IVPB 500 mg  Status:  Discontinued     500 mg 100 mL/hr over 60 Minutes Intravenous Every 8 hours 06/25/18 2306 06/25/18 2341     Subjective: Seen and examined at bedside and he states he is doing well and he states he is passing gas and had 3 bowel movements overnight.  Was wanting some water.  No chest pain, lightheadedness or dizziness.  States abdomen was not hurting.  Wound VAC in place.  No other concerns or complaints at this time and was feeling better  Objective: Vitals:   07/01/18 0721 07/01/18 0800 07/01/18 1000 07/01/18 1200  BP: (!) 143/59 (!) 142/64 (!) 156/67 (!) 129/52  Pulse: 61 67 72 61  Resp: (!) 25 12 (!) 27 17  Temp:  98.2 F (36.8 C)    TempSrc:  Oral    SpO2: 98% 98% 99% 98%  Weight:      Height:        Intake/Output Summary (Last 24 hours) at 07/01/2018 1508 Last data filed at 07/01/2018 1300 Gross per 24 hour  Intake 1582.82 ml  Output 1150 ml  Net 432.82 ml   Filed  Weights   06/30/18 0400 06/30/18 0600 07/01/18 0500  Weight: 75.7 kg 74.1 kg 72.5 kg   Examination: Physical Exam  Constitutional: Patient is a elderly overweight African-American male is currently no acute distress sitting up in chair and appears much more comfortable today than he was yesterday Eyes: Lids and conjunctive are normal.  Has arcus senilis.  Sclera anicteric ENMT: External ears and nose appear normal.  Grossly normal hearing.  NG tube has been removed Neck: Appears supple with no JVD.  Has a right IJ catheter Respiratory: Slightly diminished to auscultation bilaterally no appreciable wheezing, rales, rhonchi.  Patient not tachypneic or using any accessory muscles to breathe  Cardiovascular: Regular rate and rhythm-paced.  No appreciable murmurs, rubs, gallops.  Has trace to 1+ lower extremity edema Abdomen: Soft, slightly tender to palpate.  Bowel sounds are present GU: Deferred Musculoskeletal: Contractures cyanosis.  No deformity noted Skin: Skin is warm and dry.  No appreciable rashes or lesions.  Has a wound VAC on his abdominal incision and abdominal binder has been removed Neurologic: Cranial nerves II through XII grossly intact no appreciable focal deficits Psychiatric: Normal mood and affect.  Intact judgment insight.  Patient is awake, alert and oriented x3  Data Reviewed: I have personally reviewed following labs and imaging studies  CBC: Recent Labs  Lab 06/27/18 0557 06/28/18 0547 06/29/18 0500  06/29/18 1526 06/30/18 0254 06/30/18 0326 06/30/18 1000 07/01/18 0500  WBC 5.8 6.1 7.1  --   --  5.9  --   --  6.3  NEUTROABS  --  5.1 6.0  --   --  4.8  --   --  5.3  HGB 9.0* 8.3* 8.3*   < > 8.6* 10.0* 9.9* 10.2* 10.2*  HCT 27.7* 25.4* 25.1*   < > 26.6* 30.9* 30.4* 31.8* 31.3*  MCV 97.5 95.1 96.2  --   --  94.2  --   --  95.4  PLT 73* 71* 87*  --   --  90*  --   --  116*   < > = values in this interval not displayed.   Basic Metabolic Panel: Recent Labs    Lab 06/28/18 0547 06/28/18 1952 06/29/18 0500 06/30/18 0254 07/01/18 0500  NA 141 140 142 141 144  K 3.3* 3.7 3.6 3.7 3.8  CL 109 110 112* 112* 114*  CO2 23 23 21* 20* 18*  GLUCOSE 179* 155* 185* 158* 195*  BUN 45* 47* 48* 46* 43*  CREATININE 2.12* 2.22* 2.18* 2.32* 2.12*  CALCIUM 8.5* 8.6* 8.7* 8.7* 8.8*  MG 2.0 2.1 2.1 2.4 2.1  PHOS 2.7  --  2.9 3.1 3.1   GFR: Estimated Creatinine Clearance: 21.6 mL/min (A) (by C-G formula based on SCr of 2.12 mg/dL (H)). Liver Function Tests: Recent Labs  Lab 06/25/18 2117 06/28/18 0547 06/29/18 0500 06/30/18 0254 07/01/18 0500  AST 19 20 20 19 16   ALT 11 11 11 12 13   ALKPHOS 96 44 62 109 85  BILITOT 1.2 0.9 1.2 2.0* 1.8*  PROT 7.6 5.7* 5.8* 6.0* 6.1*  ALBUMIN 4.1 2.6* 2.7* 2.7* 2.6*   No results for input(s): LIPASE, AMYLASE in the last 168 hours. No results for input(s): AMMONIA in the last 168 hours. Coagulation Profile: Recent Labs  Lab 06/26/18 0809  INR 0.94   Cardiac Enzymes: Recent Labs  Lab 06/26/18 0809  TROPONINI 0.04*   BNP (last 3 results) No results for  input(s): PROBNP in the last 8760 hours. HbA1C: No results for input(s): HGBA1C in the last 72 hours. CBG: Recent Labs  Lab 06/30/18 1307 06/30/18 1718 06/30/18 2209 07/01/18 0757 07/01/18 1216  GLUCAP 114* 131* 141* 187* 134*   Lipid Profile: No results for input(s): CHOL, HDL, LDLCALC, TRIG, CHOLHDL, LDLDIRECT in the last 72 hours. Thyroid Function Tests: No results for input(s): TSH, T4TOTAL, FREET4, T3FREE, THYROIDAB in the last 72 hours. Anemia Panel: No results for input(s): VITAMINB12, FOLATE, FERRITIN, TIBC, IRON, RETICCTPCT in the last 72 hours. Sepsis Labs: No results for input(s): PROCALCITON, LATICACIDVEN in the last 168 hours.  Recent Results (from the past 240 hour(s))  MRSA PCR Screening     Status: None   Collection Time: 06/26/18  1:48 AM  Result Value Ref Range Status   MRSA by PCR NEGATIVE NEGATIVE Final    Comment:         The GeneXpert MRSA Assay (FDA approved for NASAL specimens only), is one component of a comprehensive MRSA colonization surveillance program. It is not intended to diagnose MRSA infection nor to guide or monitor treatment for MRSA infections. Performed at [facility], [place] [address] [place],  [address]     Radiology Studies: Dg Abd 1 View  Result Date: 06/29/2018 CLINICAL DATA:  84 year old male NG tube placement. EXAM: ABDOMEN - 1 VIEW COMPARISON:  CT Abdomen and Pelvis 06/25/2018. FINDINGS: Portable AP view at 1812 hours. Enteric tube in place with side hole in the midline below the diaphragm likely at or just beyond the gastroesophageal junction. Midline abdominal skin staples are new. Visible bowel gas pattern is within normal limits. Partially visible left chest AICD. Calcified aortic atherosclerosis. Grossly negative lung bases. IMPRESSION: 1. Enteric tube placed to the stomach. Side hole could be at or just inside the GEJ. Advance 4-5 centimeters for more optimal placement. 2. Interval postoperative changes to the abdominal wall. Negative visible bowel gas pattern. Electronically Signed   By: Genevie Ann M.D.   On: 06/29/2018 18:51   Dg Chest Port 1 View  Result Date: 07/01/2018 CLINICAL DATA:  Shortness of breath EXAM: PORTABLE CHEST 1 VIEW COMPARISON:  06/24/2018 FINDINGS: Lungs are essentially clear.  No pleural effusion or pneumothorax. The heart is top-normal in size. Left subclavian ICD. Insert cabbage Right IJ venous catheter terminates cavoatrial junction. Enteric tube courses below the diaphragm. IMPRESSION: No evidence of acute cardiopulmonary disease. Stable support apparatus as above. Electronically Signed   By: Julian Hy M.D.   On: 07/01/2018 08:30   Scheduled Meds: . chlorhexidine  15 mL Mouth Rinse BID  . Chlorhexidine Gluconate Cloth  6 each Topical Daily  . insulin aspart  0-9 Units Subcutaneous TID WC  . mouth rinse  15 mL Mouth  Rinse q12n4p  . metoprolol tartrate  2.5 mg Intravenous Q6H  . [START ON 07/02/2018] pantoprazole  40 mg Intravenous Q12H  . psyllium  1 packet Oral Daily  . saccharomyces boulardii  250 mg Oral BID  . sodium chloride flush  10-40 mL Intracatheter Q12H   Continuous Infusions: . sodium chloride Stopped (06/29/18 1033)  . pantoprozole (PROTONIX) infusion 8 mg/hr (07/01/18 1300)  . piperacillin-tazobactam (ZOSYN)  IV 3.375 g (07/01/18 1359)  .  sodium bicarbonate  infusion 1000 mL 50 mL/hr at 07/01/18 1300    LOS: 6 days   Kerney Elbe, DO [facility] PAGER is on AMION  If 7PM-7AM, please contact night-coverage [email or web address] Password [email or web address] 07/01/2018, 3:08 PM

## 2018-07-01 NOTE — Progress Notes (Signed)
Per pt's request friend [name] was notified and updated on pt's new room number

## 2018-07-01 NOTE — Progress Notes (Signed)
Gave report to [facility], transported pt in the bed on telemetry

## 2018-07-01 NOTE — Progress Notes (Signed)
[name] [id number] [birth year]  CARE TEAM:  PCP: [name], MD  Outpatient Care Team: Patient Care Team: [name], MD as PCP - General (Internal Medicine) Larey Dresser, MD as PCP - Advanced Heart Failure (Cardiology) Evans Lance, MD as PCP - Electrophysiology (Cardiology) Charolette Forward, MD as Consulting Physician (Cardiology)  Inpatient Treatment Team: Treatment Team: Attending Provider: Kerney Elbe, DO; Rounding Team: Garner Gavel, MD; Consulting Physician: Wilford Corner, MD; Consulting Physician: Kerney Elbe, DO; Registered Nurse: Rosine Beat, RN; Registered Nurse: Suzzanne Cloud, RN   Problem List:   Principal Problem:   Perforated small intestine s/p SB resection 06/26/2018 Active Problems:   Chronic systolic heart failure ([place])   ICD (implantable cardioverter-defibrillator) in place   CORONARY ARTERY BYPASS GRAFT, HX OF   DM type 2 causing vascular disease ([place])   Preoperative cardiovascular examination   Ischemic cardiomyopathy   Anemia of chronic disease   Hypertension   GERD (gastroesophageal reflux disease)   A-fib ([place])   Ventral incisional hernia   Small bowel ischemia (HCC)   Thrombocytopenia ([place])   Hematemesis   CKD (chronic kidney disease) stage 3, GFR 30-59 ml/min ([place])   Acute esophagitis   Gastritis   06/26/2018   PRE-OPERATIVE DIAGNOSIS:  Perforated viscus, abdominal wall hernia  POST-OPERATIVE DIAGNOSIS:  Same  PROCEDURE:   1. Exploratory laparotomy 2. Resection of small bowel x2 3. Lysis of adhesions x 90 minutes  SURGEON:  Sharon [place], MD   Hospital Stay = 6 days  Assessment  Ileus s/p emergent SB resection & VWH repair  Plan:  -NGT for ileus after emergency surgery for small bowel perforation and incarcerated hernia.  - tolerated NGT clamping trial.  I d/c'd NGT.  Try clears - ADAT Dys1  Parenteral nutrition if does not open by postop day 7. - hopefully less  likely  -PPI for esophagitis/gastritis per gastroenterology.  -Most likely remove incisional dressing and evaluate incision tomorrow  -Okay to transfer to floor from surgery standpoint.  Will defer to primary medicine service, especially given cardiac and other issues.  -Follow electrolytes.  Stable for now  FEN: NPO/IV fluids  ID: Zosyn 10/21 =>>continue for 7 days = stop tomorrow  DVT: SCD - No starting heparin due to low platelets, starting to improve F[place]: In  Follow up: Dr. Dema Severin   07/01/2018    Subjective: (Chief complaint)  Patient feels better.  Tolerated NG tube being clamped with some liquids.    Many bowel movements.  Nursing in room.  Sitting up in bed  Objective:  Vital signs:  Vitals:   07/01/18 0400 07/01/18 0401 07/01/18 0500 07/01/18 0721  BP: (!) 158/80   (!) 143/59  Pulse: 72   61  Resp: 15   (!) 25  Temp:  (!) 97.4 F (36.3 C)    TempSrc:  Oral    SpO2: 97%   98%  Weight:   72.5 kg   Height:        Last BM Date: 07/01/18  Intake/Output   Yesterday:  10/26 0701 - 10/27 0700 In: 1280.6 [P.O.:150; I.V.:946.9; NG/GT:30; IV Piggyback:153.6] Out: 1100 [Urine:1100] This shift:  No intake/output data recorded.  Bowel function:  Flatus: YES  BM:  YES  Drain: Scant thick bilious fluid in nasogastric canister   Physical Exam:  General: Pt awake/alert/oriented x4 in no acute distress.  It with mask face and slow to respond but mentally intact Eyes: PERRL, normal EOM.  Sclera clear.  No icterus Neuro: CN II-XII intact w/o focal sensory/motor deficits. Lymph: No head/neck/groin lymphadenopathy Psych:  No delerium/psychosis/paranoia HENT: Normocephalic, Mucus membranes moist.  No thrush.  G-tube in place.  Tends to mumble but somewhat comprehensible. Neck: Supple, No tracheal deviation Chest: No chest wall pain w good excursion CV:  Pulses intact.  Regular rhythm MS: Normal AROM mjr joints.  No obvious deformity  Abdomen: Soft.   Mildy distended.  Mildly tender at incisions only.  No evidence of peritonitis.  No incarcerated hernias.  Ext:  No deformity.  No mjr edema.  No cyanosis Skin: No petechiae / purpura  Results:   Labs: Results for orders placed or performed during the hospital encounter of 06/25/18 (from the past 48 hour(s))  Glucose, capillary     Status: Abnormal   Collection Time: 06/29/18 11:36 AM  Result Value Ref Range   Glucose-Capillary 130 (H) 70 - 99 mg/dL  Hemoglobin and hematocrit, blood     Status: Abnormal   Collection Time: 06/29/18  2:00 PM  Result Value Ref Range   Hemoglobin 7.9 (L) 13.0 - 17.0 g/dL   HCT 24.9 (L) 39.0 - 52.0 %    Comment: Performed at [facility], [place] [address], [place], [place] [address]  Prepare RBC     Status: None   Collection Time: 06/29/18  2:58 PM  Result Value Ref Range   Order Confirmation      ORDER PROCESSED BY BLOOD BANK Performed at [facility], [place] [address], [place], [place] [address]   Hemoglobin and hematocrit, blood     Status: Abnormal   Collection Time: 06/29/18  3:26 PM  Result Value Ref Range   Hemoglobin 8.6 (L) 13.0 - 17.0 g/dL   HCT 26.6 (L) 39.0 - 52.0 %    Comment: Performed at [facility], [place] [address], [place], [place] [address]  Glucose, capillary     Status: Abnormal   Collection Time: 06/29/18  4:32 PM  Result Value Ref Range   Glucose-Capillary 150 (H) 70 - 99 mg/dL   Comment 1 Notify RN    Comment 2 Document in Chart   Glucose, capillary     Status: Abnormal   Collection Time: 06/29/18  5:58 PM  Result Value Ref Range   Glucose-Capillary 163 (H) 70 - 99 mg/dL  Type and screen [place]     Status: None (Preliminary result)   Collection Time: 06/29/18  6:00 PM  Result Value Ref Range   ABO/RH(D) B POS    Antibody Screen NEG    Sample Expiration 07/02/2018    Unit Number [id number]    Blood Component Type RED CELLS,LR    Unit  division 00    Status of Unit ISSUED    Transfusion Status OK TO TRANSFUSE    Crossmatch Result      Compatible Performed at [place] [address], [place], [place] [address]   Glucose, capillary     Status: Abnormal   Collection Time: 06/29/18  9:49 PM  Result Value Ref Range   Glucose-Capillary 149 (H) 70 - 99 mg/dL  CBC with Differential/Platelet     Status: Abnormal   Collection Time: 06/30/18  2:54 AM  Result Value Ref Range   WBC 5.9 4.0 - 10.5 K/uL   RBC 3.28 (L) 4.22 - 5.81 MIL/uL   Hemoglobin 10.0 (L) 13.0 - 17.0 g/dL   HCT 30.9 (L) 39.0 - 52.0 %  MCV 94.2 80.0 - 100.0 fL   MCH 30.5 26.0 - 34.0 pg   MCHC 32.4 30.0 - 36.0 g/dL   RDW 15.7 (H) 11.5 - 15.5 %   Platelets 90 (L) 150 - 400 K/uL    Comment: REPEATED TO VERIFY Immature Platelet Fraction may be clinically indicated, consider ordering this additional test [id number] CONSISTENT WITH PREVIOUS RESULT    nRBC 0.3 (H) 0.0 - 0.2 %   Neutrophils Relative % 82 %   Neutro Abs 4.8 1.7 - 7.7 K/uL   Lymphocytes Relative 7 %   Lymphs Abs 0.4 (L) 0.7 - 4.0 K/uL   Monocytes Relative 7 %   Monocytes Absolute 0.4 0.1 - 1.0 K/uL   Eosinophils Relative 3 %   Eosinophils Absolute 0.2 0.0 - 0.5 K/uL   Basophils Relative 0 %   Basophils Absolute 0.0 0.0 - 0.1 K/uL   Immature Granulocytes 1 %   Abs Immature Granulocytes 0.07 0.00 - 0.07 K/uL    Comment: Performed at [facility], [place] [address], [place], [place] [address]  Comprehensive metabolic panel     Status: Abnormal   Collection Time: 06/30/18  2:54 AM  Result Value Ref Range   Sodium 141 135 - 145 mmol/L   Potassium 3.7 3.5 - 5.1 mmol/L   Chloride 112 (H) 98 - 111 mmol/L   CO2 20 (L) 22 - 32 mmol/L   Glucose, Bld 158 (H) 70 - 99 mg/dL   BUN 46 (H) 8 - 23 mg/dL   Creatinine, Ser 2.32 (H) 0.61 - 1.24 mg/dL   Calcium 8.7 (L) 8.9 - 10.3 mg/dL   Total Protein 6.0 (L) 6.5 - 8.1 g/dL   Albumin 2.7 (L) 3.5 - 5.0 g/dL   AST  19 15 - 41 U/L   ALT 12 0 - 44 U/L   Alkaline Phosphatase 109 38 - 126 U/L   Total Bilirubin 2.0 (H) 0.3 - 1.2 mg/dL   GFR calc non Af Amer 23 (L) >60 mL/min   GFR calc Af Amer 27 (L) >60 mL/min    Comment: (NOTE) The eGFR has been calculated using the CKD EPI equation. This calculation has not been validated in all clinical situations. eGFR's persistently <60 mL/min signify possible Chronic Kidney Disease.    Anion gap 9 5 - 15    Comment: Performed at [facility], [place] [address], [place], [place] [address]  Magnesium     Status: None   Collection Time: 06/30/18  2:54 AM  Result Value Ref Range   Magnesium 2.4 1.7 - 2.4 mg/dL    Comment: Performed at [facility], [place] [address], [place], [place] [address]  Phosphorus     Status: None   Collection Time: 06/30/18  2:54 AM  Result Value Ref Range   Phosphorus 3.1 2.5 - 4.6 mg/dL    Comment: Performed at [facility], [place] [address], [place], [place] [address]  Hemoglobin and hematocrit, blood     Status: Abnormal   Collection Time: 06/30/18  3:26 AM  Result Value Ref Range   Hemoglobin 9.9 (L) 13.0 - 17.0 g/dL   HCT 30.4 (L) 39.0 - 52.0 %    Comment: Performed at [facility], [place] [address] [place], [place] [address]  Glucose, capillary     Status: Abnormal   Collection Time: 06/30/18  8:32 AM  Result Value Ref Range   Glucose-Capillary 140 (H) 70 - 99 mg/dL   Comment  1 Notify RN    Comment 2 Document in Chart   Hemoglobin and hematocrit, blood     Status: Abnormal   Collection Time: 06/30/18 10:00 AM  Result Value Ref Range   Hemoglobin 10.2 (L) 13.0 - 17.0 g/dL   HCT 31.8 (L) 39.0 - 52.0 %    Comment: Performed at [facility], [place] [address], [place], [place] [address]  Glucose, capillary     Status: Abnormal   Collection Time: 06/30/18  1:07 PM  Result Value Ref Range   Glucose-Capillary 114 (H) 70 - 99 mg/dL   Comment 1  Notify RN    Comment 2 Document in Chart   Glucose, capillary     Status: Abnormal   Collection Time: 06/30/18  5:18 PM  Result Value Ref Range   Glucose-Capillary 131 (H) 70 - 99 mg/dL   Comment 1 Notify RN    Comment 2 Document in Chart   Glucose, capillary     Status: Abnormal   Collection Time: 06/30/18 10:09 PM  Result Value Ref Range   Glucose-Capillary 141 (H) 70 - 99 mg/dL   Comment 1 Notify RN    Comment 2 Document in Chart   CBC with Differential/Platelet     Status: Abnormal   Collection Time: 07/01/18  5:00 AM  Result Value Ref Range   WBC 6.3 4.0 - 10.5 K/uL   RBC 3.28 (L) 4.22 - 5.81 MIL/uL   Hemoglobin 10.2 (L) 13.0 - 17.0 g/dL   HCT 31.3 (L) 39.0 - 52.0 %   MCV 95.4 80.0 - 100.0 fL   MCH 31.1 26.0 - 34.0 pg   MCHC 32.6 30.0 - 36.0 g/dL   RDW 16.4 (H) 11.5 - 15.5 %   Platelets 116 (L) 150 - 400 K/uL    Comment: REPEATED TO VERIFY Immature Platelet Fraction may be clinically indicated, consider ordering this additional test [id number] CONSISTENT WITH PREVIOUS RESULT    nRBC 0.0 0.0 - 0.2 %   Neutrophils Relative % 84 %   Neutro Abs 5.3 1.7 - 7.7 K/uL   Lymphocytes Relative 7 %   Lymphs Abs 0.4 (L) 0.7 - 4.0 K/uL   Monocytes Relative 6 %   Monocytes Absolute 0.4 0.1 - 1.0 K/uL   Eosinophils Relative 2 %   Eosinophils Absolute 0.1 0.0 - 0.5 K/uL   Basophils Relative 0 %   Basophils Absolute 0.0 0.0 - 0.1 K/uL   Immature Granulocytes 1 %   Abs Immature Granulocytes 0.09 (H) 0.00 - 0.07 K/uL    Comment: Performed at [facility], [place] [address] [place], [place] [address]  Comprehensive metabolic panel     Status: Abnormal   Collection Time: 07/01/18  5:00 AM  Result Value Ref Range   Sodium 144 135 - 145 mmol/L   Potassium 3.8 3.5 - 5.1 mmol/L   Chloride 114 (H) 98 - 111 mmol/L   CO2 18 (L) 22 - 32 mmol/L   Glucose, Bld 195 (H) 70 - 99 mg/dL   BUN 43 (H) 8 - 23 mg/dL   Creatinine, Ser 2.12 (H) 0.61 - 1.24 mg/dL   Calcium 8.8 (L) 8.9  - 10.3 mg/dL   Total Protein 6.1 (L) 6.5 - 8.1 g/dL   Albumin 2.6 (L) 3.5 - 5.0 g/dL   AST 16 15 - 41 U/L   ALT 13 0 - 44 U/L   Alkaline Phosphatase 85 38 - 126 U/L   Total Bilirubin 1.8 (H) 0.3 -  1.2 mg/dL   GFR calc non Af Amer 26 (L) >60 mL/min   GFR calc Af Amer 30 (L) >60 mL/min    Comment: (NOTE) The eGFR has been calculated using the CKD EPI equation. This calculation has not been validated in all clinical situations. eGFR's persistently <60 mL/min signify possible Chronic Kidney Disease.    Anion gap 12 5 - 15    Comment: Performed at [facility], [place] [address] [place], [place] [address]  Magnesium     Status: None   Collection Time: 07/01/18  5:00 AM  Result Value Ref Range   Magnesium 2.1 1.7 - 2.4 mg/dL    Comment: Performed at [facility], [place] [address] [place], [place] [address]  Phosphorus     Status: None   Collection Time: 07/01/18  5:00 AM  Result Value Ref Range   Phosphorus 3.1 2.5 - 4.6 mg/dL    Comment: Performed at [facility], [place] [address], [place], [place] [address]    Imaging / Studies: Dg Abd 1 View  Result Date: 06/29/2018 CLINICAL DATA:  84 year old male NG tube placement. EXAM: ABDOMEN - 1 VIEW COMPARISON:  CT Abdomen and Pelvis 06/25/2018. FINDINGS: Portable AP view at 1812 hours. Enteric tube in place with side hole in the midline below the diaphragm likely at or just beyond the gastroesophageal junction. Midline abdominal skin staples are new. Visible bowel gas pattern is within normal limits. Partially visible left chest AICD. Calcified aortic atherosclerosis. Grossly negative lung bases. IMPRESSION: 1. Enteric tube placed to the stomach. Side hole could be at or just inside the GEJ. Advance 4-5 centimeters for more optimal placement. 2. Interval postoperative changes to the abdominal wall. Negative visible bowel gas pattern. Electronically Signed   By: Genevie Ann M.D.   On: 06/29/2018  18:51    Medications / Allergies: per chart  Antibiotics: Anti-infectives (From admission, onward)   Start     Dose/Rate Route Frequency Ordered Stop   06/26/18 1400  piperacillin-tazobactam (ZOSYN) IVPB 3.375 g     3.375 g 12.5 mL/hr over 240 Minutes Intravenous Every 8 hours 06/26/18 1218     06/26/18 0815  cefoTEtan (CEFOTAN) 2 g in sodium chloride 0.9 % 100 mL IVPB     2 g 200 mL/hr over 30 Minutes Intravenous On call to O.R. 06/26/18 0809 06/26/18 1103   06/26/18 0600  piperacillin-tazobactam (ZOSYN) IVPB 2.25 g  Status:  Discontinued     2.25 g 100 mL/hr over 30 Minutes Intravenous Every 6 hours 06/26/18 0447 06/26/18 1217   06/25/18 2345  piperacillin-tazobactam (ZOSYN) IVPB 3.375 g     3.375 g 100 mL/hr over 30 Minutes Intravenous  Once 06/25/18 2341 06/26/18 0031   06/25/18 2315  ceFEPIme (MAXIPIME) 2 g in sodium chloride 0.9 % 100 mL IVPB  Status:  Discontinued     2 g 200 mL/hr over 30 Minutes Intravenous  Once 06/25/18 2306 06/25/18 2341   06/25/18 2315  metroNIDAZOLE (FLAGYL) IVPB 500 mg  Status:  Discontinued     500 mg 100 mL/hr over 60 Minutes Intravenous Every 8 hours 06/25/18 2306 06/25/18 2341        Note: Portions of this report may have been transcribed using voice recognition software. Every effort was made to ensure accuracy; however, inadvertent computerized transcription errors may be present.   Any transcriptional errors that result from this process are unintentional.     Adin Hector, MD, FACS, MASCRS Gastrointestinal and  Minimally Invasive Surgery    [address] [place], [place] [address] [phone] Main / Paging [phone] Fax

## 2018-07-02 ENCOUNTER — Encounter (HOSPITAL_COMMUNITY): Payer: Self-pay | Admitting: Gastroenterology

## 2018-07-02 LAB — COMPREHENSIVE METABOLIC PANEL
ALBUMIN: 2.3 g/dL — AB (ref 3.5–5.0)
ALK PHOS: 67 U/L (ref 38–126)
ALT: 11 U/L (ref 0–44)
ANION GAP: 8 (ref 5–15)
AST: 13 U/L — AB (ref 15–41)
BUN: 34 mg/dL — AB (ref 8–23)
CO2: 25 mmol/L (ref 22–32)
Calcium: 8.4 mg/dL — ABNORMAL LOW (ref 8.9–10.3)
Chloride: 110 mmol/L (ref 98–111)
Creatinine, Ser: 1.7 mg/dL — ABNORMAL HIGH (ref 0.61–1.24)
GFR calc Af Amer: 39 mL/min — ABNORMAL LOW (ref >60)
GFR calc non Af Amer: 34 mL/min — ABNORMAL LOW (ref >60)
GLUCOSE: 184 mg/dL — AB (ref 70–99)
Potassium: 2.9 mmol/L — ABNORMAL LOW (ref 3.5–5.1)
SODIUM: 143 mmol/L (ref 135–145)
Total Bilirubin: 1.5 mg/dL — ABNORMAL HIGH (ref 0.3–1.2)
Total Protein: 5.4 g/dL — ABNORMAL LOW (ref 6.5–8.1)

## 2018-07-02 LAB — TYPE AND SCREEN
ABO/RH(D): B POS
ANTIBODY SCREEN: NEGATIVE
UNIT DIVISION: 0

## 2018-07-02 LAB — GLUCOSE, CAPILLARY
GLUCOSE-CAPILLARY: 173 mg/dL — AB (ref 70–99)
GLUCOSE-CAPILLARY: 248 mg/dL — AB (ref 70–99)
GLUCOSE-CAPILLARY: 128 mg/dL — AB (ref 70–99)
GLUCOSE-CAPILLARY: 125 mg/dL — AB (ref 70–99)

## 2018-07-02 LAB — CBC WITH DIFFERENTIAL/PLATELET
ABS IMMATURE GRANULOCYTES: 0.07 10*3/uL (ref 0.00–0.07)
BASOS ABS: 0 10*3/uL (ref 0.0–0.1)
Basophils Relative: 0 %
Eosinophils Absolute: 0.1 10*3/uL (ref 0.0–0.5)
Eosinophils Relative: 3 %
HEMATOCRIT: 27.9 % — AB (ref 39.0–52.0)
HEMOGLOBIN: 9.3 g/dL — AB (ref 13.0–17.0)
IMMATURE GRANULOCYTES: 2 %
LYMPHS ABS: 0.4 10*3/uL — AB (ref 0.7–4.0)
LYMPHS PCT: 10 %
MCH: 31.1 pg (ref 26.0–34.0)
MCHC: 33.3 g/dL (ref 30.0–36.0)
MCV: 93.3 fL (ref 80.0–100.0)
MONOS PCT: 8 %
Monocytes Absolute: 0.3 10*3/uL (ref 0.1–1.0)
NEUTROS PCT: 77 %
Neutro Abs: 3.5 10*3/uL (ref 1.7–7.7)
Platelets: 95 10*3/uL — ABNORMAL LOW (ref 150–400)
RBC: 2.99 MIL/uL — ABNORMAL LOW (ref 4.22–5.81)
RDW: 16 % — ABNORMAL HIGH (ref 11.5–15.5)
WBC: 4.5 10*3/uL (ref 4.0–10.5)
nRBC: 0 % (ref 0.0–0.2)

## 2018-07-02 LAB — MAGNESIUM: Magnesium: 1.8 mg/dL (ref 1.7–2.4)

## 2018-07-02 LAB — PHOSPHORUS: PHOSPHORUS: 2.1 mg/dL — AB (ref 2.5–4.6)

## 2018-07-02 LAB — BPAM RBC
Blood Product Expiration Date: 201911182359
ISSUE DATE / TIME: 201910252112
UNIT TYPE AND RH: 7300

## 2018-07-02 LAB — PREALBUMIN: Prealbumin: 10.2 mg/dL — ABNORMAL LOW (ref 18–38)

## 2018-07-02 MED ORDER — BOOST / RESOURCE BREEZE PO LIQD CUSTOM
1.0000 | Freq: Three times a day (TID) | ORAL | Status: DC
  Administered 2018-07-02 – 2018-07-04 (×5): 1 via ORAL

## 2018-07-02 MED ORDER — TRAZODONE HCL 50 MG PO TABS
50.0000 mg | ORAL_TABLET | Freq: Once | ORAL | Status: AC
  Administered 2018-07-02: 50 mg via ORAL
  Filled 2018-07-02: qty 1

## 2018-07-02 MED ORDER — NITROGLYCERIN 0.4 MG SL SUBL: 0.4000 mg | SUBLINGUAL_TABLET | SUBLINGUAL | Status: DC | PRN

## 2018-07-02 MED ORDER — ESCITALOPRAM OXALATE 10 MG PO TABS
10.0000 mg | ORAL_TABLET | Freq: Every day | ORAL | Status: DC
  Administered 2018-07-02 – 2018-07-04 (×3): 10 mg via ORAL
  Filled 2018-07-02 (×3): qty 1

## 2018-07-02 MED ORDER — TORSEMIDE 20 MG PO TABS
20.0000 mg | ORAL_TABLET | ORAL | Status: DC
  Administered 2018-07-02: 20 mg via ORAL
  Filled 2018-07-02 (×2): qty 1

## 2018-07-02 MED ORDER — SPIRONOLACTONE 12.5 MG HALF TABLET
12.5000 mg | ORAL_TABLET | Freq: Every day | ORAL | Status: DC
  Administered 2018-07-02 – 2018-07-04 (×3): 12.5 mg via ORAL
  Filled 2018-07-02 (×3): qty 1

## 2018-07-02 MED ORDER — CARVEDILOL 6.25 MG PO TABS
9.3750 mg | ORAL_TABLET | Freq: Two times a day (BID) | ORAL | Status: DC
  Administered 2018-07-02 – 2018-07-04 (×4): 9.375 mg via ORAL
  Filled 2018-07-02 (×4): qty 1

## 2018-07-02 MED ORDER — INSULIN ASPART 100 UNIT/ML ~~LOC~~ SOLN
0.0000 [IU] | Freq: Three times a day (TID) | SUBCUTANEOUS | Status: DC
  Administered 2018-07-02: 2 [IU] via SUBCUTANEOUS
  Administered 2018-07-03: 8 [IU] via SUBCUTANEOUS
  Administered 2018-07-03: 3 [IU] via SUBCUTANEOUS
  Administered 2018-07-03 – 2018-07-04 (×3): 2 [IU] via SUBCUTANEOUS

## 2018-07-02 MED ORDER — TORSEMIDE 20 MG PO TABS
40.0000 mg | ORAL_TABLET | ORAL | Status: DC
  Administered 2018-07-03: 40 mg via ORAL
  Filled 2018-07-02: qty 2

## 2018-07-02 MED ORDER — POTASSIUM CHLORIDE 10 MEQ/100ML IV SOLN
10.0000 meq | INTRAVENOUS | Status: AC
  Administered 2018-07-02 (×4): 10 meq via INTRAVENOUS
  Filled 2018-07-02 (×4): qty 100

## 2018-07-02 MED ORDER — POTASSIUM CHLORIDE CRYS ER 20 MEQ PO TBCR
40.0000 meq | EXTENDED_RELEASE_TABLET | Freq: Two times a day (BID) | ORAL | Status: AC
  Administered 2018-07-02 (×2): 40 meq via ORAL
  Filled 2018-07-02 (×2): qty 2

## 2018-07-02 MED ORDER — METHOCARBAMOL 500 MG PO TABS: 500.0000 mg | ORAL_TABLET | Freq: Three times a day (TID) | ORAL | Status: DC | PRN

## 2018-07-02 MED ORDER — HYDROMORPHONE HCL 1 MG/ML IJ SOLN: 0.5000 mg | INTRAMUSCULAR | Status: DC | PRN

## 2018-07-02 MED ORDER — OXYCODONE HCL 5 MG PO TABS
5.0000 mg | ORAL_TABLET | ORAL | Status: DC | PRN
  Administered 2018-07-03: 5 mg via ORAL
  Filled 2018-07-02: qty 1

## 2018-07-02 MED ORDER — ACETAMINOPHEN 325 MG PO TABS
650.0000 mg | ORAL_TABLET | Freq: Four times a day (QID) | ORAL | Status: DC | PRN
  Administered 2018-07-02: 650 mg via ORAL
  Filled 2018-07-02 (×2): qty 2

## 2018-07-02 MED ORDER — K PHOS MONO-SOD PHOS DI & MONO 155-852-130 MG PO TABS
500.0000 mg | ORAL_TABLET | Freq: Two times a day (BID) | ORAL | Status: AC
  Administered 2018-07-02 (×2): 500 mg via ORAL
  Filled 2018-07-02 (×2): qty 2

## 2018-07-02 MED ORDER — ATORVASTATIN CALCIUM 40 MG PO TABS
40.0000 mg | ORAL_TABLET | Freq: Every day | ORAL | Status: DC
  Administered 2018-07-02 – 2018-07-03 (×2): 40 mg via ORAL
  Filled 2018-07-02 (×2): qty 1

## 2018-07-02 NOTE — Progress Notes (Signed)
Physical Therapy Treatment Patient Details Name: [name] MRN: [id number] DOB: [birth year] Today's Date: 07/02/2018    History of Present Illness Pt s/p exp lap with bowel resection on 06/26/18 and with hx of CAD, CHF, pacemaker and CABG    PT Comments    Pt limited by fatigue this session. Pt participated in bed mobility and stand pivot transfer to recliner, but would not tolerate ambulation at this time. PT to continue to follow, will continue to follow acutely.   Abdominal binder applied via rolling in supine/sidelying. Binder worn for the duration of session.    Follow Up Recommendations  SNF     Equipment Recommendations  None recommended by PT    Recommendations for Other Services       Precautions / Restrictions Precautions Precautions: Fall Precaution Comments: Abdominal binder, condom cath present Restrictions Weight Bearing Restrictions: No    Mobility  Bed Mobility Overal bed mobility: Needs Assistance Bed Mobility: Rolling;Sidelying to Sit Rolling: Mod assist;+2 for physical assistance Sidelying to sit: Mod assist;+2 for physical assistance;HOB elevated       General bed mobility comments: Pt with large BM upon arrival to room. Rolling x2 bilaterally +2 for pericare. Sidelying to sit mod assist +2 for LE management and trunk elevation and control. Pt with PT-corrected posterior LOB without bilateral LEs on the floor.  Transfers Overall transfer level: Needs assistance Equipment used: Rolling walker (2 wheeled) Transfers: Sit to/from [facility] Sit to Stand: Mod assist;+2 safety/equipment;+2 physical assistance;From elevated surface Stand pivot transfers: Mod assist;+2 safety/equipment;+2 physical assistance;From elevated surface       General transfer comment: Mod assist for trunk elevation, hand placement, steadying upon standing. Pt with BM upon standing, and pt sat back down on EOB. Pt performed stand pivot with assist for  steadying and RW guidance. PT again performed pericare to clean pt prior to sitting in recliner.   Ambulation/Gait Ambulation/Gait assistance: (NT - pt states he is "worn out")               Chief Strategy Officer    Modified Rankin (Stroke Patients Only)       Balance Overall balance assessment: Needs assistance Sitting-balance support: Feet supported;Bilateral upper extremity supported Sitting balance-Leahy Scale: Fair     Standing balance support: Bilateral upper extremity supported Standing balance-Leahy Scale: Poor                              Cognition Arousal/Alertness: Awake/alert Behavior During Therapy: WFL for tasks assessed/performed Overall Cognitive Status: Within Functional Limits for tasks assessed                                        Exercises      General Comments        Pertinent Vitals/Pain Pain Assessment: Faces Faces Pain Scale: Hurts a little bit Pain Location: abdomen  Pain Descriptors / Indicators: Sore Pain Intervention(s): Limited activity within patient's tolerance;Repositioned;Monitored during session    Home Living                      Prior Function            PT Goals (current goals can now be found in the care plan section) Acute Rehab PT  Goals Patient Stated Goal: none stated PT Goal Formulation: With patient Time For Goal Achievement: 07/12/18 Potential to Achieve Goals: Fair Progress towards PT goals: Progressing toward goals    Frequency    Min 2X/week      PT Plan Frequency needs to be updated;Current plan remains appropriate    Co-evaluation              AM-PAC PT "6 Clicks" Daily Activity  Outcome Measure  Difficulty turning over in bed (including adjusting bedclothes, sheets and blankets)?: Unable Difficulty moving from lying on back to sitting on the side of the bed? : Unable Difficulty sitting down on and standing up from a  chair with arms (e.g., wheelchair, bedside commode, etc,.)?: Unable Help needed moving to and from a bed to chair (including a wheelchair)?: A Lot Help needed walking in hospital room?: A Lot Help needed climbing 3-5 steps with a railing? : Total 6 Click Score: 8    End of Session Equipment Utilized During Treatment: Gait belt Activity Tolerance: Patient limited by fatigue Patient left: in chair;with chair alarm set;with call bell/phone within reach;with nursing/sitter in room Nurse Communication: Mobility status PT Visit Diagnosis: Other abnormalities of gait and mobility (R26.89);Muscle weakness (generalized) (M62.81)     Time: 1594-5859 PT Time Calculation (min) (ACUTE ONLY): 27 min  Charges:  $Therapeutic Activity: 23-37 mins                     [name], PT Acute Rehabilitation Services Pager [phone]  Office [phone]    [name] 07/02/2018, 2:10 PM

## 2018-07-02 NOTE — Clinical Social Work Note (Signed)
Clinical Social Work Assessment  Patient Details  Name: [name] MRN: [id number] Date of Birth: 01-28-1927  Date of referral:  07/02/18               Reason for consult:  Facility Placement                Permission sought to share information with:  Chartered certified accountant granted to share information::  Yes, Verbal Permission Granted  Name::      [name]  Agency::     Relationship::   Friend and POA  Contact Information:   [phone] - Okay to leave VM  Housing/Transportation Living arrangements for the past 2 months:  Apartment Source of Information:  Patient, Power of Attorney([name], [place] present) Patient Interpreter Needed:  None Criminal Activity/Legal Involvement Pertinent to Current Situation/Hospitalization:  No - Comment as needed Significant Relationships:  Friend Lives with:  Self Do you feel safe going back to the place where you live?  Yes Need for family participation in patient care:  No (Coment)(Patient is oriented x3 at baseline. POA, involved in care)  Care giving concerns:   84 year old male with history of CAD status post CABG, ischemic cardiomyopathy status post ICD placement, history of paroxysmal ventricular tachycardia. Patient presented to ED on 10/21 with abdominal pain.  Patient recommended for SNF.  Social Worker assessment / plan:  CSW met with patient and POA, [name] at bedside to introduce self and role and discuss SNF recommendation and agreeableness to SNF.  The patient lives by himself in an apartment. The patient does not have home health, but receives meals from [employer] during weekdays and a neighbor comes by on weekends to prepare meals for the patient. Neighbors and friend/POA, [name], are involved in the patient's care, bringing him groceries and managing medications and doctor's appointments. Patient reports high levels of independence.  Per patient, he uses a walker or cane at baseline at  home. Patient reports using a wheelchair when out in the community. Per patient, he is able to ambulate well with these assistive devices.  Patient has been to [place] for SNF in the past, and would prefer to return. CSW granted verbal permission to initiate SNF referral process.  Employment status:  Retired Nurse, adult PT Recommendations:  [place] / Referral to community resources:  [place]  Patient/Family's Response to care:  Patient and POA appreciative of CSW intervention.  Patient/Family's Understanding of and Emotional Response to Diagnosis, Current Treatment, and Prognosis:   Patient and [name] aware of continued medical work up. Both patient and [name] familiar with SNF and agreeable to plan. No further questions for CSW at this time.  Emotional Assessment Appearance:  Appears stated age Attitude/Demeanor/Rapport:    Affect (typically observed):  Accepting, Pleasant, Adaptable Orientation:  Oriented to Self, Oriented to Place, Oriented to Situation Alcohol / Substance use:  Not Applicable Psych involvement (Current and /or in the community):  No (Comment)  Discharge Needs  Concerns to be addressed:  Care Coordination Readmission within the last 30 days:  (S) Yes Current discharge risk:  Lives alone Barriers to Discharge:  Ship broker, Continued Medical Work up   [employer], [place] 07/02/2018, 11:48 AM

## 2018-07-02 NOTE — Discharge Instructions (Signed)
[facility]      [facility] [phone]  OPEN ABDOMINAL SURGERY: POST OP INSTRUCTIONS  Always review your discharge instruction sheet given to you by the facility where your surgery was performed.  IF YOU HAVE DISABILITY OR FAMILY LEAVE FORMS, YOU MUST BRING THEM TO THE OFFICE FOR PROCESSING.  PLEASE DO NOT GIVE THEM TO YOUR DOCTOR.  1. A prescription for pain medication may be given to you upon discharge.  Take your pain medication as prescribed, if needed.  If narcotic pain medicine is not needed, then you may take acetaminophen (Tylenol) or ibuprofen (Advil) as needed. 2. Take your usually prescribed medications unless otherwise directed. 3. If you need a refill on your pain medication, please contact your pharmacy. They will contact our office to request authorization.  Prescriptions will not be filled after 5pm or on week-ends. 4. You should follow a light diet the first few days after arrival home, such as soup and crackers, pudding, etc.unless your doctor has advised otherwise. A high-fiber, low fat diet can be resumed as tolerated.   Be sure to include lots of fluids daily. Most patients will experience some swelling and bruising on the chest and neck area.  Ice packs will help.  Swelling and bruising can take several days to resolve 5. Most patients will experience some swelling and bruising in the area of the incision. Ice pack will help. Swelling and bruising can take several days to resolve..  6. It is common to experience some constipation if taking pain medication after surgery.  Increasing fluid intake and taking a stool softener will usually help or prevent this problem from occurring.  A mild laxative (Milk of Magnesia or Miralax) should be taken according to package directions if there are no bowel movements after 48 hours. 7.  You may have steri-strips (small skin tapes) in place directly over the incision.  These strips should be left on the skin for 7-10 days.  If your  surgeon used skin glue on the incision, you may shower in 24 hours.  The glue will flake off over the next 2-3 weeks.  Any sutures or staples will be removed at the office during your follow-up visit. You may find that a light gauze bandage over your incision may keep your staples from being rubbed or pulled. You may shower and replace the bandage daily. 8. ACTIVITIES:  You may resume regular (light) daily activities beginning the next day--such as daily self-care, walking, climbing stairs--gradually increasing activities as tolerated.  You may have sexual intercourse when it is comfortable.  Refrain from any heavy lifting or straining until approved by your doctor. a. You may drive when you no longer are taking prescription pain medication, you can comfortably wear a seatbelt, and you can safely maneuver your car and apply brakes b. Return to Work: ___________________________________ 9. You should see your doctor in the office for a follow-up appointment approximately two weeks after your surgery.  Make sure that you call for this appointment within a day or two after you arrive home to insure a convenient appointment time. OTHER INSTRUCTIONS:  _____________________________________________________________ _____________________________________________________________  WHEN TO CALL YOUR DOCTOR: 1. Fever over 101.0 2. Inability to urinate 3. Nausea and/or vomiting 4. Extreme swelling or bruising 5. Continued bleeding from incision. 6. Increased pain, redness, or drainage from the incision. 7. Difficulty swallowing or breathing 8. Muscle cramping or spasms. 9. Numbness or tingling in hands or feet or around lips.  The clinic staff is available to   answer your questions during regular business hours.  Please don't hesitate to call and ask to speak to one of the nurses if you have concerns.  For further questions, please visit [email or web address]   

## 2018-07-02 NOTE — Progress Notes (Signed)
[facility] Progress Note  3 Days Post-Op  Subjective: CC-  Resting comfortably. States that overall he is doing well. Denies any current abdominal pain. No n/v. Tolerating clear liquids. Patient reports 3-4 loose BMs yesterday.  Objective: Vital signs in last 24 hours: Temp:  [97.7 F (36.5 C)-98.5 F (36.9 C)] 98.2 F (36.8 C) (10/28 0525) Pulse Rate:  [59-72] 60 (10/28 0525) Resp:  [17-25] 20 (10/28 0525) BP: (129-149)/(52-85) 129/53 (10/28 0525) SpO2:  [96 %-100 %] 96 % (10/28 0525) Weight:  [73.3 kg] 73.3 kg (10/28 0525) Last BM Date: 07/01/18  Intake/Output from previous day: 10/27 0701 - 10/28 0700 In: 2158.2 [P.O.:600; I.V.:1411.7; IV Piggyback:146.5] Out: 1700 [Urine:1700] Intake/Output this shift: No intake/output data recorded.  PE: Gen:  Alert, NAD, pleasant HEENT: EOM's intact, pupils equal and round Pulm:  effort normal Abd: Soft, mild distension, nontender, +BS, midline incision cdi with sutures and staples in place/no surrounding erythema or drainage Skin: no rashes noted, warm and dry  Lab Results:  Recent Labs    07/01/18 0500 07/02/18 0322  WBC 6.3 4.5  HGB 10.2* 9.3*  HCT 31.3* 27.9*  PLT 116* 95*   BMET Recent Labs    07/01/18 0500 07/02/18 0322  NA 144 143  K 3.8 2.9*  CL 114* 110  CO2 18* 25  GLUCOSE 195* 184*  BUN 43* 34*  CREATININE 2.12* 1.70*  CALCIUM 8.8* 8.4*   PT/INR No results for input(s): LABPROT, INR in the last 72 hours. CMP     Component Value Date/Time   NA 143 07/02/2018 0322   K 2.9 (L) 07/02/2018 0322   CL 110 07/02/2018 0322   CO2 25 07/02/2018 0322   GLUCOSE 184 (H) 07/02/2018 0322   BUN 34 (H) 07/02/2018 0322   CREATININE 1.70 (H) 07/02/2018 0322   CREATININE 1.56 (H) 06/08/2016 1053   CALCIUM 8.4 (L) 07/02/2018 0322   PROT 5.4 (L) 07/02/2018 0322   ALBUMIN 2.3 (L) 07/02/2018 0322   AST 13 (L) 07/02/2018 0322   ALT 11 07/02/2018 0322   ALKPHOS 67 07/02/2018 0322   BILITOT 1.5 (H)  07/02/2018 0322   GFRNONAA 34 (L) 07/02/2018 0322   GFRNONAA 53 (L) 05/12/2016 1330   GFRAA 39 (L) 07/02/2018 0322   GFRAA 61 05/12/2016 1330   Lipase     Component Value Date/Time   LIPASE 31 10/31/2014 0230       Studies/Results: Dg Chest Port 1 View  Result Date: 07/01/2018 CLINICAL DATA:  Shortness of breath EXAM: PORTABLE CHEST 1 VIEW COMPARISON:  06/24/2018 FINDINGS: Lungs are essentially clear.  No pleural effusion or pneumothorax. The heart is top-normal in size. Left subclavian ICD. Insert cabbage Right IJ venous catheter terminates cavoatrial junction. Enteric tube courses below the diaphragm. IMPRESSION: No evidence of acute cardiopulmonary disease. Stable support apparatus as above. Electronically Signed   By: Julian Hy M.D.   On: 07/01/2018 08:30    Anti-infectives: Anti-infectives (From admission, onward)   Start     Dose/Rate Route Frequency Ordered Stop   06/26/18 1400  piperacillin-tazobactam (ZOSYN) IVPB 3.375 g     3.375 g 12.5 mL/hr over 240 Minutes Intravenous Every 8 hours 06/26/18 1218     06/26/18 0815  cefoTEtan (CEFOTAN) 2 g in sodium chloride 0.9 % 100 mL IVPB     2 g 200 mL/hr over 30 Minutes Intravenous On call to O.R. 06/26/18 0809 06/26/18 1103   06/26/18 0600  piperacillin-tazobactam (ZOSYN) IVPB 2.25 g  Status:  Discontinued  2.25 g 100 mL/hr over 30 Minutes Intravenous Every 6 hours 06/26/18 0447 06/26/18 1217   06/25/18 2345  piperacillin-tazobactam (ZOSYN) IVPB 3.375 g     3.375 g 100 mL/hr over 30 Minutes Intravenous  Once 06/25/18 2341 06/26/18 0031   06/25/18 2315  ceFEPIme (MAXIPIME) 2 g in sodium chloride 0.9 % 100 mL IVPB  Status:  Discontinued     2 g 200 mL/hr over 30 Minutes Intravenous  Once 06/25/18 2306 06/25/18 2341   06/25/18 2315  metroNIDAZOLE (FLAGYL) IVPB 500 mg  Status:  Discontinued     500 mg 100 mL/hr over 60 Minutes Intravenous Every 8 hours 06/25/18 2306 06/25/18 2341       Assessment/Plan CAD s/p  CABG H/o ischemic cardiomyopathy, EF 35% AFTER AICD CKD-III/IV Anemia of chronic disease, Acute blood loss anemia from Upper GI bleed esophagitis/gastritis - per GI, on protonix drip Thrombocytopenia DM-II Hypokalemia/hypophosphatemia - replaced by medicine  Perforated viscus, abdominal wall hernia S/p ex lap, small bowel resection x2, lysis of adhesions 10/22 Dr. Dema Severin - POD 6 - path: SEGMENT OF SMALL INTESTINE (6.5 CM) SHOWING SEVERELY INFLAMED ANASTOMOTIC SITE WITH ABSCESS IN THE PERI-INTESTINAL SOFT TISSUE WITH ASSOCIATED SEROSITIS AND SEROSAL ADHESIONS - Prevenna d/c 10/28, daily dry dressing change - abdominal binder  FEN: FLD, Boost ID: Zosyn 10/21>>10/28 DVT: SCD - Not starting heparin due to low platelets, starting to improve Foley: out  Follow up: Dr. Dema Severin  Plan - Advance to full liquids and add Boost. Continue PT/OT. Add oral medications. D/c Prevenna vac. D/c zosyn today.   LOS: 7 days    Wellington Hampshire , [facility] 07/02/2018, 10:17 AM Pager: [phone] Mon 7:00 am -11:30 AM Tues-Fri 7:00 am-4:30 pm Sat-Sun 7:00 am-11:30 am

## 2018-07-02 NOTE — Progress Notes (Signed)
CSW completed assessment and FL2, sent SNF referrals through [facility].  The patient most prefers [facility]. HCPOA says [facility] is their second choice.  CSW following for discharge needs.  Stephanie Acre, [place] Social Worker [phone]

## 2018-07-02 NOTE — Progress Notes (Signed)
PROGRESS NOTE    [name]  [name]:[id number] DOB: 08-22-1927 DOA: 06/25/2018 PCP: [name] Hatchet, MD  Brief Narrative: The patient is an 84 year old male with history of CAD status post CABG, ischemic cardiomyopathy status post ICD placement, history of paroxysmal ventricular tachycardia who underwent a right upper quadrant abdominal pain that started yesterday afternoon.  CT abdomen showed a ventral hernia containing a perforated viscus with likely ischemic bowel and general surgery was consulted and admitted the patient to general surgery and medicine was consulted to help manage medical issues.  Cardiology did preoperative clearance and patient underwent surgery on 06/26/18.  On the AM of 06/29/18 I was paged by Nurse after I saw the patient; as patient started having an Upper GIB and had bright red blood in NG. Surgery Deferred this to Medicine so I started the patient on a Protonix gtt and called GI. I also ordered 1 unit of pRBC to be transfused given drop in Hb/Hct.  The patient was found to have acute esophagitis bleeding as well as acute gastritis.  GI recommended n.p.o. and avoid NG suctioning if okay with surgery and recommended ideally removing the NG tube; NGT was clamped and removed today as patient is starting to pass flatus and had 3 bowel movements. Patient was started on CLD and this was advanced to full liquid diet today.    We will continue on Protonix drip for now and then change to IV PPI later on tonight.  His IV fluids were changed and he was started on a bicarbonate drip but this has been discontinued now.  He received a few doses of IV Lasix with improvement.  Patient electrolytes were off today and will be repleting them.  Assessment & Plan:   Principal Problem:   Perforated small intestine s/p SB resection 06/26/2018 Active Problems:   Chronic systolic heart failure (HCC)   ICD (implantable cardioverter-defibrillator) in place   CORONARY ARTERY BYPASS GRAFT, HX  OF   DM type 2 causing vascular disease ([place])   Preoperative cardiovascular examination   Ischemic cardiomyopathy   Anemia of chronic disease   Hypertension   GERD (gastroesophageal reflux disease)   A-fib (HCC)   Ventral incisional hernia   Small bowel ischemia (HCC)   Thrombocytopenia (HCC)   Hematemesis   CKD (chronic kidney disease) stage 3, GFR 30-59 ml/min (HCC)   Acute esophagitis   Gastritis  Ventral hernia with perforated viscus and possible ischemic bowel postoperative day 6 with exploratory laparotomy, resection of small bowel and lysis of adhesions -NG tube was clamped and removed and patient was started on a clear liquid diet by general surgery; diet has been advanced and is now on full liquid diet -Per General Surgery  -Continue with Empiric antibodies with IV Zosyn per general surgery -Pain control per general surgery -Patient's IV fluids were changed to sodium bicarbonate 150 mEq at 50 mils per hour given his metabolic acidosis and have now been discontinued -If his bowel function does not return within 7 days Post-Op then General Surgery considering parenteral nutrition however this is less likely cases his diet has been advanced and he is on full liquid diet  CAD status post CABG -Currently denies any chest pain -Cardiology was consulted for preoperative clearance -Resume home carvedilol today -Also resume home atorvastatin and home nitro glycerin  -We will defer to GI as want to resume his home aspirin  History of ischemic cardiomyopathy with a EF of 35% status post AICD placement -Cardiology consulted for preoperative cardiovascular  clearance -Per cardiology continue beta-blocker and this was changed to IV metoprolol given that he is n.p.o. for Carvedilol however now will resume home carvedilol stop scheduled IV metoprolol -AICD was managed with a magnet prior to surgery -Cardiology checked an EKG -Patient was evaluated by heart failure Dr. Dr. Aundra Dubin and  spironolactone was added  -Cardiology was a moderate risk for intermediate surgery given his underlying comorbidities and had no contraindication for planned surgery -Fluid hydration is now stopped -Since Admission patient is + 7.052 L and will need strict I's and O's and daily weights; Weight is up 9 lbs.  -Given IV Lasix daily x2 and will resume home diuretics with torsemide and Spironolactone today -BNP was elevated at 433.0 however currently remains euvolemic  CKD stage III/IV -Creatinine is not far from baseline -BUN/Cr went from 48/2.12 -> 46/2.32 -> 43/2.12 -> 34/1.70 -Avoid nephrotoxic medications if possible but was given IV Lasix 40 mg Daily x2 days; Home Diuretics have been resumed  -IV fluid hydration is now stopped -Continue to monitor and trend renal function repeat CMP in a.m. now that patient's diuretics have been resumed including spironolactone and torsemide  Anemia of Chronic Kidney Disease/Normocytic Anemia/Acute Blood Loss Anemia in the setting of Upper GIB from Esophagitis and Gastritis  -Patient's hemoglobin/hematocrit went from 12.0/36.3 and dropped to 7.9/24.9 -In the setting of Surgical Intervention and possible also dilutional drop given fluid resuscitation; now felt to be secondary to a GI bleed which is upper in nature -Continue monitor for signs and symptoms of bleeding -Patient was started on Protonix drip and have type and screen and transfuse 1 unit PRBCs; continue the Protonix drip and then changed to IV PPI after Protonix drip is finished running later and recommend changing to po BID PPI when able to tolerate Diet Fully  -Gastroenterology Dr. Wilford Corner has evaluated the patient and took the patient for an EGD and was found to be patchy moderate inflammation characterized by erosions and erythema along with esophagitis with bleeding throughout the entire esophagus. -Continue with Protonix drip and n.p.o. at this time and then changed to IV PPI later this  evening  -Repeat CBC this AM showed Hb/Hct 9.3/27.9 -NG tube is been removed -GI signed off the case but would ask them when it is ok to resume ASA  Thrombocytopenia -Patient's platelet count dropped from 147 is now 95 -Continue monitor for signs of bleeding -Repeat CBC in a.m.  Diabetes mellitus type 2 -Hemoglobin A1c was 7.3 -Placed on sensitive NovoLog sliding scale insulin before meals and will increase to Moderate Novolog SSI AC -Continue monitor CBGs; blood glucose has been ranging from 134-248 -We will consult diabetes coronary for further evaluation recommendations  Hypokalemia -Patient's potassium this morning was 2.9 -Replete with p.o. potassium chloride 40 mg p.o. twice daily x2 doses along with IV potassium chloride 40 mEq -Continue monitor replete as necessary -Repeat CMP in a.m.  Hypophosphatemia -Patient's Phos Level was 2.1 -Replete with K-Phos Neutral 500 mg p.o. twice daily x2 doses -Continue to Monitor and and Replete as Necessary -Repeat CMP in AM   Hyperchloremia, Non-gap metabolic acidosis, improved -Patient's chloride level was 114 and bicarbonate dropped down to 18 -IV fluids have been changed and patient was placed on sodium bicarbonate drip with 150 mEq at 50 mils per hour and now stopped  -Patient was also given a dose of IV Lasix to try and prevent volume overload given his heart failure history and home diuretics have been resumed -Patient's Chloride improved to  110 and CO2 was 25 this AM -Continue to Monitor and Repeat CMP in AM   Hyperbilirubinemia -Improving. T Bili went from 2.0 -> 1.5 -Continue to Monitor and repeat CMP in AM   DVT prophylaxis: Per General Surgery; SCDs and chemical DVT prophylaxis when not bleeding  Code Status: FULL CODE Family Communication: Discussed with Family at bedside Disposition Plan: Per General Surgery but likely SNF at D/C  Consultants:   [facility]   General Surgery  Cardiology  Gastroenterology     Procedures: Done by Dr. [name] and Dr. Excell Seltzer 1. Exploratory laparotomy 2. Resection of small bowel x2 3. Lysis of adhesions x 90 minutes  EGD on 06/29/18 Findings:      LA Grade D (one or more mucosal breaks involving at least 75% of       esophageal circumference) esophagitis with bleeding was found in the       entire esophagus.      The Z-line was found 40 cm from the incisors.      Patchy moderate inflammation characterized by erosions and erythema was       found in the gastric fundus and in the gastric body.      Linear areas of erosions noted likely due to NG suction trauma.      The examined duodenum was normal (also clear bile seen). Impression:               - LA Grade D acute esophagitis.                           - Z-line, 40 cm from the incisors.                           - Acute gastritis.                           - Normal examined duodenum.                           - No specimens collected.   Antimicrobials:  Anti-infectives (From admission, onward)   Start     Dose/Rate Route Frequency Ordered Stop   06/26/18 1400  piperacillin-tazobactam (ZOSYN) IVPB 3.375 g     3.375 g 12.5 mL/hr over 240 Minutes Intravenous Every 8 hours 06/26/18 1218 07/02/18 2359   06/26/18 0815  cefoTEtan (CEFOTAN) 2 g in sodium chloride 0.9 % 100 mL IVPB     2 g 200 mL/hr over 30 Minutes Intravenous On call to O.R. 06/26/18 0809 06/26/18 1103   06/26/18 0600  piperacillin-tazobactam (ZOSYN) IVPB 2.25 g  Status:  Discontinued     2.25 g 100 mL/hr over 30 Minutes Intravenous Every 6 hours 06/26/18 0447 06/26/18 1217   06/25/18 2345  piperacillin-tazobactam (ZOSYN) IVPB 3.375 g     3.375 g 100 mL/hr over 30 Minutes Intravenous  Once 06/25/18 2341 06/26/18 0031   06/25/18 2315  ceFEPIme (MAXIPIME) 2 g in sodium chloride 0.9 % 100 mL IVPB  Status:  Discontinued     2 g 200 mL/hr over 30 Minutes Intravenous  Once 06/25/18 2306 06/25/18 2341   06/25/18 2315   metroNIDAZOLE (FLAGYL) IVPB 500 mg  Status:  Discontinued     500 mg 100 mL/hr over 60 Minutes Intravenous Every 8 hours 06/25/18 2306 06/25/18 2341  Subjective: Seen and examined at bedside and states that he was doing "fair".  Is happy that he is able to eat more.  States he is passing flatus.  No chest pain, lightheadedness or dizziness.  Abdomen is slightly sore.  No nausea or vomiting.  No other concerns or complaints at this time.  Objective: Vitals:   07/01/18 1839 07/01/18 2058 07/02/18 0525 07/02/18 1140  BP: (!) 146/58 (!) 149/85 (!) 129/53 119/64  Pulse: (!) 59 68 60 79  Resp:  20 20   Temp:  97.7 F (36.5 C) 98.2 F (36.8 C)   TempSrc:  Oral Oral   SpO2:  100% 96%   Weight:   73.3 kg   Height:        Intake/Output Summary (Last 24 hours) at 07/02/2018 1307 Last data filed at 07/02/2018 8657 Gross per 24 hour  Intake 1949.49 ml  Output 1450 ml  Net 499.49 ml   Filed Weights   06/30/18 0600 07/01/18 0500 07/02/18 0525  Weight: 74.1 kg 72.5 kg 73.3 kg   Examination: Physical Exam  Constitutional: Patient is an elderly, overweight African-American male currently no acute distress laying in bed Eyes: Has arcus senilis.  Lids and conjunctive are normal.  Sclera are anicteric ENMT: External ears and nose appear normal.  Grossly normal hearing. Neck: Supple no JVD patient has a right IJ catheter Respiratory: Slightly diminished to auscultation bilaterally no appreciable wheezing, rales, rhonchi.  Patient not tachypneic wheezing excess muscle breathe Cardiovascular: Regular rate and rhythm and is paced.  No appreciable murmurs, rubs or gallops.  Has 1+ lower extremity edema Abdomen: Soft, slightly tender to palpate.  Bowel sounds present GU: Deferred Musculoskeletal: No contractures or cyanosis.  No joint deformities noted Skin: Skin is warm and dry.  No appreciable rashes or lesions limited skin evaluation.  Abdomen incision is covered. Neurologic: Cranial nerves  II through XII grossly intact no appreciable focal deficits Psychiatric: Normal mood and affect.  Intact judgment insight.  Patient is awake, alert and oriented x3  Data Reviewed: I have personally reviewed following labs and imaging studies  CBC: Recent Labs  Lab 06/28/18 0547 06/29/18 0500  06/30/18 0254 06/30/18 0326 06/30/18 1000 07/01/18 0500 07/02/18 0322  WBC 6.1 7.1  --  5.9  --   --  6.3 4.5  NEUTROABS 5.1 6.0  --  4.8  --   --  5.3 3.5  HGB 8.3* 8.3*   < > 10.0* 9.9* 10.2* 10.2* 9.3*  HCT 25.4* 25.1*   < > 30.9* 30.4* 31.8* 31.3* 27.9*  MCV 95.1 96.2  --  94.2  --   --  95.4 93.3  PLT 71* 87*  --  90*  --   --  116* 95*   < > = values in this interval not displayed.   Basic Metabolic Panel: Recent Labs  Lab 06/28/18 0547 06/28/18 1952 06/29/18 0500 06/30/18 0254 07/01/18 0500 07/02/18 0322  NA 141 140 142 141 144 143  K 3.3* 3.7 3.6 3.7 3.8 2.9*  CL 109 110 112* 112* 114* 110  CO2 23 23 21* 20* 18* 25  GLUCOSE 179* 155* 185* 158* 195* 184*  BUN 45* 47* 48* 46* 43* 34*  CREATININE 2.12* 2.22* 2.18* 2.32* 2.12* 1.70*  CALCIUM 8.5* 8.6* 8.7* 8.7* 8.8* 8.4*  MG 2.0 2.1 2.1 2.4 2.1 1.8  PHOS 2.7  --  2.9 3.1 3.1 2.1*   GFR: Estimated Creatinine Clearance: 27 mL/min (A) (by C-G formula based on SCr  of 1.7 mg/dL (H)). Liver Function Tests: Recent Labs  Lab 06/28/18 0547 06/29/18 0500 06/30/18 0254 07/01/18 0500 07/02/18 0322  AST 20 20 19 16  13*  ALT 11 11 12 13 11   ALKPHOS 44 62 109 85 67  BILITOT 0.9 1.2 2.0* 1.8* 1.5*  PROT 5.7* 5.8* 6.0* 6.1* 5.4*  ALBUMIN 2.6* 2.7* 2.7* 2.6* 2.3*   No results for input(s): LIPASE, AMYLASE in the last 168 hours. No results for input(s): AMMONIA in the last 168 hours. Coagulation Profile: Recent Labs  Lab 06/26/18 0809  INR 0.94   Cardiac Enzymes: Recent Labs  Lab 06/26/18 0809  TROPONINI 0.04*   BNP (last 3 results) No results for input(s): PROBNP in the last 8760 hours. HbA1C: No results for  input(s): HGBA1C in the last 72 hours. CBG: Recent Labs  Lab 07/01/18 1216 07/01/18 1629 07/01/18 2058 07/02/18 0736 07/02/18 1153  GLUCAP 134* 146* 148* 173* 248*   Lipid Profile: No results for input(s): CHOL, HDL, LDLCALC, TRIG, CHOLHDL, LDLDIRECT in the last 72 hours. Thyroid Function Tests: No results for input(s): TSH, T4TOTAL, FREET4, T3FREE, THYROIDAB in the last 72 hours. Anemia Panel: No results for input(s): VITAMINB12, FOLATE, FERRITIN, TIBC, IRON, RETICCTPCT in the last 72 hours. Sepsis Labs: No results for input(s): PROCALCITON, LATICACIDVEN in the last 168 hours.  Recent Results (from the past 240 hour(s))  MRSA PCR Screening     Status: None   Collection Time: 06/26/18  1:48 AM  Result Value Ref Range Status   MRSA by PCR NEGATIVE NEGATIVE Final    Comment:        The GeneXpert MRSA Assay (FDA approved for NASAL specimens only), is one component of a comprehensive MRSA colonization surveillance program. It is not intended to diagnose MRSA infection nor to guide or monitor treatment for MRSA infections. Performed at [facility], [place] [address], [place], [place] [address]     Radiology Studies: Dg Chest Port 1 View  Result Date: 07/01/2018 CLINICAL DATA:  Shortness of breath EXAM: PORTABLE CHEST 1 VIEW COMPARISON:  06/24/2018 FINDINGS: Lungs are essentially clear.  No pleural effusion or pneumothorax. The heart is top-normal in size. Left subclavian ICD. Insert cabbage Right IJ venous catheter terminates cavoatrial junction. Enteric tube courses below the diaphragm. IMPRESSION: No evidence of acute cardiopulmonary disease. Stable support apparatus as above. Electronically Signed   By: Julian Hy M.D.   On: 07/01/2018 08:30   Scheduled Meds: . chlorhexidine  15 mL Mouth Rinse BID  . feeding supplement  1 Container Oral TID BM  . insulin aspart  0-9 Units Subcutaneous TID WC  . mouth rinse  15 mL Mouth Rinse q12n4p  . metoprolol  tartrate  2.5 mg Intravenous Q6H  . pantoprazole  40 mg Intravenous Q12H  . phosphorus  500 mg Oral BID  . potassium chloride  40 mEq Oral BID  . psyllium  1 packet Oral Daily  . saccharomyces boulardii  250 mg Oral BID  . sodium chloride flush  10-40 mL Intracatheter Q12H   Continuous Infusions: . sodium chloride 100 mL (07/02/18 0903)  . piperacillin-tazobactam (ZOSYN)  IV 3.375 g (07/02/18 0529)  . potassium chloride 10 mEq (07/02/18 1144)    LOS: 7 days   [name] Elbe, DO [facility] PAGER is on [place]  If 7PM-7AM, please contact night-coverage [email or web address] Password [email or web address] 07/02/2018, 1:07 PM

## 2018-07-02 NOTE — Care Management Important Message (Signed)
Important Message  Patient Details  Name: [name] MRN: [id number] Date of Birth: 08/04/1927   Medicare Important Message Given:  Yes    Kerin Salen 07/02/2018, 11:43 AMImportant Message  Patient Details  Name: [name] MRN: [id number] Date of Birth: 05-04-1927   Medicare Important Message Given:  Yes    Kerin Salen 07/02/2018, 11:43 AM

## 2018-07-02 NOTE — NC FL2 (Signed)
[place] LEVEL OF CARE SCREENING TOOL     IDENTIFICATION  Patient Name: [name] Birthdate: [birth year] Sex: male Admission Date (Current Location): 06/25/2018  [facility] [place] Number:  Herbalist and Address:  [facility],  [place] [address] [place]      Provider Number: [id number]  Attending Physician Name and Address:  Edison Pace Md, MD  Relative Name and Phone Number:  [name]     [phone]    Current Level of Care: Hospital Recommended Level of Care: [place] Prior Approval Number:    Date Approved/Denied: 07/02/18 PASRR Number: [id number]  Discharge Plan: SNF    Current Diagnoses: Patient Active Problem List   Diagnosis Date Noted  . Perforated small intestine s/p SB resection 06/26/2018 06/30/2018  . CKD (chronic kidney disease) stage 3, GFR 30-59 ml/min (HCC) 06/30/2018  . Acute esophagitis 06/30/2018  . Gastritis 06/30/2018  . Hematemesis 06/29/2018  . Thrombocytopenia ([place]) 06/27/2018  . Ventral incisional hernia 06/25/2018  . Small bowel ischemia ([place]) 06/25/2018  . Orthostatic hypotension 06/29/2015  . VT (ventricular tachycardia) ([place])   . C1 cervical fracture ([place]) 06/24/2015  . Anemia of chronic disease 06/24/2015  . Laceration 06/24/2015  . Intractable pain 06/24/2015  . A-fib ([place]) 06/24/2015  . Hypertension   . GERD (gastroesophageal reflux disease)   . Protein-calorie malnutrition, severe ([place]) 11/02/2014  . Partial small bowel obstruction ([place]) 10/31/2014  . DM type 2 causing vascular disease ([place]) 10/31/2014  . Do not resuscitate 10/31/2014  . Preoperative cardiovascular examination 10/31/2014  . Ischemic cardiomyopathy   . Atrial flutter ([place]) 09/02/2014  . Chronic systolic heart failure ([place]) 07/23/2009  . ICD (implantable cardioverter-defibrillator) in place 07/23/2009  . Coronary atherosclerosis 07/17/2009  . CORONARY ARTERY BYPASS GRAFT, HX OF 07/17/2009     Orientation RESPIRATION BLADDER Height & Weight     Self, Place, Situation  Normal Incontinent Weight: 161 lb 9.6 oz (73.3 kg) Height:  5\' 4"  (162.6 cm)  BEHAVIORAL SYMPTOMS/MOOD NEUROLOGICAL BOWEL NUTRITION STATUS      Incontinent Diet(Regular)  AMBULATORY STATUS COMMUNICATION OF NEEDS Skin   Limited Assist Verbally Surgical wounds                       Personal Care Assistance Level of Assistance  Bathing, Feeding, Dressing Bathing Assistance: Limited assistance Feeding assistance: Independent Dressing Assistance: Limited assistance     Functional Limitations Info  Speech, Hearing, Sight Sight Info: Adequate Hearing Info: Adequate Speech Info: Adequate    SPECIAL CARE FACTORS FREQUENCY  PT (By licensed PT), OT (By licensed OT)     PT Frequency: Minimum 2x weekly OT Frequency: Minimum 2x weekly            Contractures Contractures Info: Not present    Additional Factors Info  Code Status, Allergies Code Status Info: FULL Allergies Info: No known allergies           Current Medications (07/02/2018):  This is the current hospital active medication list Current Facility-Administered Medications  Medication Dose Route Frequency Provider Last Rate Last Dose  . 0.9 %  sodium chloride infusion   Intravenous PRN Michael Boston, MD 10 mL/hr at 07/02/18 1311 50 mL at 07/02/18 1311  . acetaminophen (TYLENOL) tablet 650 mg  650 mg Oral Q6H PRN Meuth, Brooke A, PA-C   650 mg at 07/02/18 1430  . atorvastatin (LIPITOR) tablet 40 mg  40 mg Oral [address] [place],  DO      . bisacodyl (DULCOLAX) suppository 10 mg  10 mg Rectal Q12H PRN Michael Boston, MD      . carvedilol (COREG) tablet 9.375 mg  9.375 mg Oral BID WC Sheikh, Omair Latif, DO      . chlorhexidine (PERIDEX) 0.12 % solution 15 mL  15 mL Mouth Rinse BID Michael Boston, MD   15 mL at 07/02/18 0919  . escitalopram (LEXAPRO) tablet 10 mg  10 mg Oral Daily Raiford Noble [place], DO   10 mg at 07/02/18 1415   . feeding supplement (BOOST / RESOURCE BREEZE) liquid 1 Container  1 Container Oral TID BM Meuth, Brooke A, PA-C   1 Container at 07/02/18 1040  . HYDROmorphone (DILAUDID) injection 0.5-1 mg  0.5-1 mg Intravenous Q4H PRN Meuth, Brooke A, PA-C      . insulin aspart (novoLOG) injection 0-15 Units  0-15 Units Subcutaneous TID WC Sheikh, Omair Latif, DO      . MEDLINE mouth rinse  15 mL Mouth Rinse q12n4p Michael Boston, MD   15 mL at 07/02/18 1154  . methocarbamol (ROBAXIN) tablet 500 mg  500 mg Oral Q8H PRN Meuth, Brooke A, PA-C      . nitroGLYCERIN (NITROSTAT) SL tablet 0.4 mg  0.4 mg Sublingual Q5 min PRN Sheikh, Omair Latif, DO      . ondansetron (ZOFRAN-ODT) disintegrating tablet 4 mg  4 mg Oral Q6H PRN Michael Boston, MD       Or  . ondansetron (ZOFRAN) injection 4 mg  4 mg Intravenous Q6H PRN Michael Boston, MD      . oxyCODONE (Oxy IR/ROXICODONE) immediate release tablet 5 mg  5 mg Oral Q4H PRN Meuth, Brooke A, PA-C      . pantoprazole (PROTONIX) injection 40 mg  40 mg Intravenous Gorden Harms, MD      . phosphorus (K PHOS NEUTRAL) tablet 500 mg  500 mg Oral BID Sheikh, Omair [place], DO   500 mg at 07/02/18 0910  . piperacillin-tazobactam (ZOSYN) IVPB 3.375 g  3.375 g Intravenous Q8H Meuth, Brooke A, PA-C 12.5 mL/hr at 07/02/18 1314 3.375 g at 07/02/18 1314  . potassium chloride SA (K-DUR,KLOR-CON) CR tablet 40 mEq  40 mEq Oral BID Raiford Noble [place], DO   40 mEq at 07/02/18 0277  . psyllium (HYDROCIL/METAMUCIL) packet 1 packet  1 packet Oral Daily Gross, Remo Lipps, MD      . saccharomyces boulardii (FLORASTOR) capsule 250 mg  250 mg Oral BID Michael Boston, MD   250 mg at 07/02/18 0916  . sodium chloride flush (NS) 0.9 % injection 10-40 mL  10-40 mL Intracatheter Gorden Harms, MD   10 mL at 07/01/18 0538  . sodium chloride flush (NS) 0.9 % injection 10-40 mL  10-40 mL Intracatheter PRN Michael Boston, MD      . spironolactone (ALDACTONE) tablet 12.5 mg  12.5 mg Oral Daily Raiford Noble  Latif, DO   12.5 mg at 07/02/18 1415  . torsemide (DEMADEX) tablet 20 mg  20 mg Oral Q48H Sheikh, Georgina Quint [place], DO   20 mg at 07/02/18 1415  . [START ON 07/03/2018] torsemide (DEMADEX) tablet 40 mg  40 mg Oral Q48H Raiford Noble [place], DO         Discharge Medications: Please see discharge summary for a list of discharge medications.  Relevant Imaging Results:  Relevant Lab Results:   Additional Information SSN: [id number]  Joellen Jersey, [place]

## 2018-07-03 LAB — CBC WITH DIFFERENTIAL/PLATELET
Abs Immature Granulocytes: 0.11 10*3/uL — ABNORMAL HIGH (ref 0.00–0.07)
BASOS ABS: 0 10*3/uL (ref 0.0–0.1)
BASOS PCT: 0 %
Eosinophils Absolute: 0.1 10*3/uL (ref 0.0–0.5)
Eosinophils Relative: 2 %
HCT: 30.2 % — ABNORMAL LOW (ref 39.0–52.0)
Hemoglobin: 10 g/dL — ABNORMAL LOW (ref 13.0–17.0)
Immature Granulocytes: 2 %
Lymphocytes Relative: 11 %
Lymphs Abs: 0.6 10*3/uL — ABNORMAL LOW (ref 0.7–4.0)
MCH: 31.1 pg (ref 26.0–34.0)
MCHC: 33.1 g/dL (ref 30.0–36.0)
MCV: 93.8 fL (ref 80.0–100.0)
MONO ABS: 0.4 10*3/uL (ref 0.1–1.0)
Monocytes Relative: 7 %
NRBC: 0.4 % — AB (ref 0.0–0.2)
Neutro Abs: 4.2 10*3/uL (ref 1.7–7.7)
Neutrophils Relative %: 78 %
Platelets: 100 10*3/uL — ABNORMAL LOW (ref 150–400)
RBC: 3.22 MIL/uL — AB (ref 4.22–5.81)
RDW: 15.9 % — ABNORMAL HIGH (ref 11.5–15.5)
WBC: 5.5 10*3/uL (ref 4.0–10.5)

## 2018-07-03 LAB — GLUCOSE, CAPILLARY
GLUCOSE-CAPILLARY: 257 mg/dL — AB (ref 70–99)
GLUCOSE-CAPILLARY: 193 mg/dL — AB (ref 70–99)
Glucose-Capillary: 124 mg/dL — ABNORMAL HIGH (ref 70–99)
Glucose-Capillary: 221 mg/dL — ABNORMAL HIGH (ref 70–99)

## 2018-07-03 LAB — COMPREHENSIVE METABOLIC PANEL
ALK PHOS: 65 U/L (ref 38–126)
ALT: 9 U/L (ref 0–44)
ANION GAP: 8 (ref 5–15)
AST: 13 U/L — ABNORMAL LOW (ref 15–41)
Albumin: 2.5 g/dL — ABNORMAL LOW (ref 3.5–5.0)
BUN: 26 mg/dL — ABNORMAL HIGH (ref 8–23)
CALCIUM: 8.2 mg/dL — AB (ref 8.9–10.3)
CO2: 23 mmol/L (ref 22–32)
Chloride: 108 mmol/L (ref 98–111)
Creatinine, Ser: 1.62 mg/dL — ABNORMAL HIGH (ref 0.61–1.24)
GFR calc non Af Amer: 36 mL/min — ABNORMAL LOW (ref >60)
GFR, EST AFRICAN AMERICAN: 42 mL/min — AB (ref >60)
Glucose, Bld: 190 mg/dL — ABNORMAL HIGH (ref 70–99)
POTASSIUM: 4 mmol/L (ref 3.5–5.1)
SODIUM: 139 mmol/L (ref 135–145)
Total Bilirubin: 1.5 mg/dL — ABNORMAL HIGH (ref 0.3–1.2)
Total Protein: 5.8 g/dL — ABNORMAL LOW (ref 6.5–8.1)

## 2018-07-03 LAB — PHOSPHORUS: PHOSPHORUS: 2.6 mg/dL (ref 2.5–4.6)

## 2018-07-03 LAB — MAGNESIUM: Magnesium: 1.8 mg/dL (ref 1.7–2.4)

## 2018-07-03 MED ORDER — PANTOPRAZOLE SODIUM 40 MG PO TBEC
40.0000 mg | DELAYED_RELEASE_TABLET | Freq: Two times a day (BID) | ORAL | Status: DC
  Administered 2018-07-03 – 2018-07-04 (×2): 40 mg via ORAL
  Filled 2018-07-03 (×2): qty 1

## 2018-07-03 NOTE — Progress Notes (Signed)
[facility] Progress Note  4 Days Post-Op  Subjective: CC-  No complaints this morning. Tolerating full liquids. Denies n/v. Reports several loose BMs overnight. WBC 5.5, afebrile.  Objective: Vital signs in last 24 hours: Temp:  [98.5 F (36.9 C)-99.1 F (37.3 C)] 99.1 F (37.3 C) (10/29 2423) Pulse Rate:  [62-79] 65 (10/29 0613) Resp:  [17-18] 18 (10/29 5361) BP: (113-131)/(59-69) 122/59 (10/29 0613) SpO2:  [90 %-97 %] 95 % (10/29 4431) Weight:  [74 kg] 74 kg (10/29 0613) Last BM Date: 07/01/18  Intake/Output from previous day: 10/28 0701 - 10/29 0700 In: 1483.6 [P.O.:720; I.V.:258; IV Piggyback:505.6] Out: 1075 [Urine:1075] Intake/Output this shift: Total I/O In: 440 [P.O.:440] Out: -   PE: Gen:  Alert, NAD, pleasant HEENT: EOM's intact, pupils equal and round Pulm:  effort normal Abd: Soft, mild distension, nontender, +BS, midline incision cdi with sutures and staples in place/no cellulitis or drainage, proximal aspect of wound appears to have some hematoma beneath sutures/staples     Lab Results:  Recent Labs    07/02/18 0322 07/03/18 0910  WBC 4.5 5.5  HGB 9.3* 10.0*  HCT 27.9* 30.2*  PLT 95* 100*   BMET Recent Labs    07/02/18 0322 07/03/18 0910  NA 143 139  K 2.9* 4.0  CL 110 108  CO2 25 23  GLUCOSE 184* 190*  BUN 34* 26*  CREATININE 1.70* 1.62*  CALCIUM 8.4* 8.2*   PT/INR No results for input(s): LABPROT, INR in the last 72 hours. CMP     Component Value Date/Time   NA 139 07/03/2018 0910   K 4.0 07/03/2018 0910   CL 108 07/03/2018 0910   CO2 23 07/03/2018 0910   GLUCOSE 190 (H) 07/03/2018 0910   BUN 26 (H) 07/03/2018 0910   CREATININE 1.62 (H) 07/03/2018 0910   CREATININE 1.56 (H) 06/08/2016 1053   CALCIUM 8.2 (L) 07/03/2018 0910   PROT 5.8 (L) 07/03/2018 0910   ALBUMIN 2.5 (L) 07/03/2018 0910   AST 13 (L) 07/03/2018 0910   ALT 9 07/03/2018 0910   ALKPHOS 65 07/03/2018 0910   BILITOT 1.5 (H) 07/03/2018 0910   GFRNONAA 36 (L) 07/03/2018 0910   GFRNONAA 53 (L) 05/12/2016 1330   GFRAA 42 (L) 07/03/2018 0910   GFRAA 61 05/12/2016 1330   Lipase     Component Value Date/Time   LIPASE 31 10/31/2014 0230       Studies/Results: No results found.  Anti-infectives: Anti-infectives (From admission, onward)   Start     Dose/Rate Route Frequency Ordered Stop   06/26/18 1400  piperacillin-tazobactam (ZOSYN) IVPB 3.375 g     3.375 g 12.5 mL/hr over 240 Minutes Intravenous Every 8 hours 06/26/18 1218 07/02/18 2359   06/26/18 0815  cefoTEtan (CEFOTAN) 2 g in sodium chloride 0.9 % 100 mL IVPB     2 g 200 mL/hr over 30 Minutes Intravenous On call to O.R. 06/26/18 0809 06/26/18 1103   06/26/18 0600  piperacillin-tazobactam (ZOSYN) IVPB 2.25 g  Status:  Discontinued     2.25 g 100 mL/hr over 30 Minutes Intravenous Every 6 hours 06/26/18 0447 06/26/18 1217   06/25/18 2345  piperacillin-tazobactam (ZOSYN) IVPB 3.375 g     3.375 g 100 mL/hr over 30 Minutes Intravenous  Once 06/25/18 2341 06/26/18 0031   06/25/18 2315  ceFEPIme (MAXIPIME) 2 g in sodium chloride 0.9 % 100 mL IVPB  Status:  Discontinued     2 g 200 mL/hr over 30 Minutes Intravenous  Once 06/25/18  2306 06/25/18 2341   06/25/18 2315  metroNIDAZOLE (FLAGYL) IVPB 500 mg  Status:  Discontinued     500 mg 100 mL/hr over 60 Minutes Intravenous Every 8 hours 06/25/18 2306 06/25/18 2341       Assessment/Plan CAD s/p CABG H/o ischemic cardiomyopathy, EF 35% AFTER AICD CKD-III/IV Anemia of chronic disease, Acute blood loss anemia from Upper GI bleed esophagitis/gastritis - per GI, on protonix. Hg stable Thrombocytopenia DM-II Hypokalemia/hypophosphatemia - resolved  Perforated viscus, abdominal wall hernia S/p ex lap, small bowel resection x2, lysis of adhesions 10/22 Dr. Dema Severin - POD 7 - path: SEGMENT OF SMALL INTESTINE (6.5 CM) SHOWING SEVERELY INFLAMED ANASTOMOTIC SITE WITH ABSCESS IN THE PERI-INTESTINAL SOFT TISSUE WITH ASSOCIATED  SEROSITIS AND SEROSAL ADHESIONS - Prevenna d/c 10/28, daily dry dressing change - continue abdominal binder  FEN: soft diet, Boost ID: Zosyn 10/21>>10/28 DVT: SCD - Not starting heparin due to low platelets, starting to improve Foley: out  Follow up: Dr. Dema Severin  Plan - Advance to soft diet. Continue PT/OT. Transition to oral protonix. If patient tolerates soft diet he will be stable for discharge to SNF tomorrow.   LOS: 8 days    Wellington Hampshire , [facility] Surgery 07/03/2018, 11:11 AM Pager: [phone] Mon 7:00 am -11:30 AM Tues-Fri 7:00 am-4:30 pm Sat-Sun 7:00 am-11:30 am

## 2018-07-03 NOTE — Progress Notes (Signed)
PROGRESS NOTE    [name]  PIR:[id number] DOB: 1927/02/12 DOA: 06/25/2018 PCP: [name], MD  Brief Narrative: The patient is an 84 year old male with history of CAD status post CABG, ischemic cardiomyopathy status post ICD placement, history of paroxysmal ventricular tachycardia who underwent a right upper quadrant abdominal pain that started yesterday afternoon.  CT abdomen showed a ventral hernia containing a perforated viscus with likely ischemic bowel and general surgery was consulted and admitted the patient to general surgery and medicine was consulted to help manage medical issues.  Cardiology did preoperative clearance and patient underwent surgery on 06/26/18.  On the AM of 06/29/18 I was paged by Nurse after I saw the patient; as patient started having an Upper GIB and had bright red blood in NG. Surgery Deferred this to Medicine so I started the patient on a Protonix gtt and called GI. I also ordered 1 unit of pRBC to be transfused given drop in Hb/Hct.  The patient was found to have acute esophagitis bleeding as well as acute gastritis.  GI recommended n.p.o. and avoid NG suctioning if okay with surgery and recommended ideally removing the NG tube; NGT was clamped and removed today as patient is starting to pass flatus and had 3 bowel movements. Patient was started on CLD and this was advanced to full liquid diet and is now on a Soft Diet.    His IV Protonix gtt was discontinued and IV PPI going to be changed to po. His IV fluids were changed and he was started on a bicarbonate drip but this has been discontinued now.  He received a few doses of IV Lasix with improvement.  Patient electrolytes were off yesterday and will be repleting them.  Most of his home medications were resumed yesterday and patient is doing well from medical standpoint at this time the medicine team will sign off the case and appreciate this consultation.  If any questions arise please do not hesitate or  feel free to ask [place] or reconsult [place] for any other additional assistance.  Assessment & Plan:   Principal Problem:   Perforated small intestine s/p SB resection 06/26/2018 Active Problems:   Chronic systolic heart failure ([place])   ICD (implantable cardioverter-defibrillator) in place   CORONARY ARTERY BYPASS GRAFT, HX OF   DM type 2 causing vascular disease ([place])   Preoperative cardiovascular examination   Ischemic cardiomyopathy   Anemia of chronic disease   Hypertension   GERD (gastroesophageal reflux disease)   A-fib (HCC)   Ventral incisional hernia   Small bowel ischemia (HCC)   Thrombocytopenia (HCC)   Hematemesis   CKD (chronic kidney disease) stage 3, GFR 30-59 ml/min (HCC)   Acute esophagitis   Gastritis  Ventral hernia with perforated viscus and possible ischemic bowel postoperative day 7 with exploratory laparotomy, resection of small bowel and lysis of adhesions -NG tube was clamped and removed and patient was started on a clear liquids which was advanced to fulls and now is on Soft Diet  -Per General Surgery  -Continue with Empiric antibodies with IV Zosyn per general surgery; General Surgery has stopped IV Abx -Pain control per general surgery -IVF Discontinued  -If his bowel function does not return within 7 days Post-Op then General Surgery considering parenteral nutrition however this is less likely cases his diet has been advanced and he is Soft Diet  CAD status post CABG -Currently denies any chest pain -Cardiology was consulted for preoperative clearance -Resume home carvedilol yesterday  -Also  resume home atorvastatin and home nitro glycerin  -We will defer to GI as want to resume his home aspirin and have notified Primary Team to discuss with GI  History of ischemic cardiomyopathy with a EF of 35% status post AICD placement -Cardiology consulted for preoperative cardiovascular clearance -Per cardiology continue beta-blocker and this was changed to IV  metoprolol given that he is n.p.o. for Carvedilol however now will resume home carvedilol stop scheduled IV metoprolol -AICD was managed with a magnet prior to surgery -Cardiology checked an EKG -Patient was evaluated by heart failure Dr. Dr. Aundra Dubin and spironolactone was added  -Cardiology was a moderate risk for intermediate surgery given his underlying comorbidities and had no contraindication for planned surgery -Fluid hydration is now stopped -Since Admission patient is + 7.420  L and will need strict I's and O's and daily weights; Weight is up 11 lbs.  -Given IV Lasix daily x2 the previous few days and resumed home diuretics with torsemide and Spironolactone yesteday  -BNP was elevated at 433.0 however currently remains euvolemic  CKD stage III/IV -Creatinine is not far from baseline -BUN/Cr went from 48/2.12 -> 26/1.62 -Avoid nephrotoxic medications if possible but was given IV Lasix 40 mg Daily x2 days; Home Diuretics have been resumed  -IV fluid hydration is now stopped -Continue to monitor and trend renal function repeat CMP in a.m. now that patient's diuretics have been resumed including spironolactone and torsemide  Anemia of Chronic Kidney Disease/Normocytic Anemia/Acute Blood Loss Anemia in the setting of Upper GIB from Esophagitis and Gastritis  -Patient's hemoglobin/hematocrit went from 12.0/36.3 and dropped to 7.9/24.9 -In the setting of Surgical Intervention and possible also dilutional drop given fluid resuscitation; now felt to be secondary to a GI bleed which is upper in nature -Continued to monitor for signs and symptoms of bleeding -IV Protonix gtt now stopped and was on IV PPI but being changed to po BID by General Surgery -Gastroenterology Dr. Wilford Corner has evaluated the patient and took the patient for an EGD and was found to be patchy moderate inflammation characterized by erosions and erythema along with esophagitis with bleeding throughout the entire  esophagus. -Repeat CBC this AM showed Hb/Hct was 10.0/30.2 -NG tube is been removed -GI signed off the case but would ask them when it is ok to resume ASA  Thrombocytopenia -Patient's platelet count dropped from 147 is now trending back up and is 100 -Continue monitor for signs of bleeding -Repeat CBC in a.m.  Diabetes mellitus type 2 -Hemoglobin A1c was 7.3 -Placed on sensitive NovoLog sliding scale insulin before meals and will increase to Moderate Novolog SSI AC -Continue monitor CBGs; blood glucose has been ranging from 124-248 -We will consult diabetes coronary for further evaluation recommendations  Hypokalemia -Patient's potassium was 2.9 and improved to 4.0 -Replete with p.o. potassium chloride 40 mg p.o. twice daily x2 doses along with IV potassium chloride 40 mEq yesterday -Continue monitor replete as necessary -Repeat CMP in a.m.  Hypophosphatemia -Patient's Phos Level was 2.1 and improved to 2.6 -Replete with K-Phos Neutral 500 mg p.o. twice daily x2 doses -Continue to Monitor and and Replete as Necessary -Repeat CMP in AM   Hyperchloremia, Non-gap metabolic acidosis, improved -Patient's chloride level was 114 and bicarbonate dropped down to 18 -IV fluids have been changed and patient was placed on sodium bicarbonate drip with 150 mEq at 50 mils per hour which has now stopped  -Patient was also given a dose of IV Lasix to try and prevent  volume overload given his heart failure history and home diuretics have been resumed -Patient's Chloride improved to 108 and CO2 was 23 this AM -Continue to Monitor and Repeat CMP in AM   Hyperbilirubinemia -Improving. T Bili went from 2.0 -> 1.5 -Continue to Monitor and repeat CMP in AM   DVT prophylaxis: Per General Surgery; SCDs and chemical DVT prophylaxis when not bleeding  Code Status: FULL CODE Family Communication: Discussed with Family at bedside Disposition Plan: Per General Surgery but likely SNF at D/C  Consultants:    [facility]   General Surgery  Cardiology  Gastroenterology    Procedures: Done by Dr. [name] and Dr. Excell Seltzer 1. Exploratory laparotomy 2. Resection of small bowel x2 3. Lysis of adhesions x 90 minutes  EGD on 06/29/18 Findings:      LA Grade D (one or more mucosal breaks involving at least 75% of       esophageal circumference) esophagitis with bleeding was found in the       entire esophagus.      The Z-line was found 40 cm from the incisors.      Patchy moderate inflammation characterized by erosions and erythema was       found in the gastric fundus and in the gastric body.      Linear areas of erosions noted likely due to NG suction trauma.      The examined duodenum was normal (also clear bile seen). Impression:               - LA Grade D acute esophagitis.                           - Z-line, 40 cm from the incisors.                           - Acute gastritis.                           - Normal examined duodenum.                           - No specimens collected.   Antimicrobials:  Anti-infectives (From admission, onward)   Start     Dose/Rate Route Frequency Ordered Stop   06/26/18 1400  piperacillin-tazobactam (ZOSYN) IVPB 3.375 g     3.375 g 12.5 mL/hr over 240 Minutes Intravenous Every 8 hours 06/26/18 1218 07/02/18 2359   06/26/18 0815  cefoTEtan (CEFOTAN) 2 g in sodium chloride 0.9 % 100 mL IVPB     2 g 200 mL/hr over 30 Minutes Intravenous On call to O.R. 06/26/18 0809 06/26/18 1103   06/26/18 0600  piperacillin-tazobactam (ZOSYN) IVPB 2.25 g  Status:  Discontinued     2.25 g 100 mL/hr over 30 Minutes Intravenous Every 6 hours 06/26/18 0447 06/26/18 1217   06/25/18 2345  piperacillin-tazobactam (ZOSYN) IVPB 3.375 g     3.375 g 100 mL/hr over 30 Minutes Intravenous  Once 06/25/18 2341 06/26/18 0031   06/25/18 2315  ceFEPIme (MAXIPIME) 2 g in sodium chloride 0.9 % 100 mL IVPB  Status:  Discontinued     2 g 200 mL/hr over 30 Minutes  Intravenous  Once 06/25/18 2306 06/25/18 2341   06/25/18 2315  metroNIDAZOLE (FLAGYL) IVPB 500 mg  Status:  Discontinued     500  mg 100 mL/hr over 60 Minutes Intravenous Every 8 hours 06/25/18 2306 06/25/18 2341     Subjective: Seen and examined at bedside and is doing better.  No chest pain, lightheadedness or dizziness.  Tolerating food and diet was advanced to soft diet.  No more signs of bleeding.  No other concerns or is at this time.  Objective: Vitals:   07/02/18 1140 07/02/18 1440 07/02/18 2033 07/03/18 0613  BP: 119/64 131/69 113/69 (!) 122/59  Pulse: 79 66 62 65  Resp:  18 17 18   Temp:  98.5 F (36.9 C) 98.5 F (36.9 C) 99.1 F (37.3 C)  TempSrc:  Oral Oral Oral  SpO2:  90% 97% 95%  Weight:    74 kg  Height:        Intake/Output Summary (Last 24 hours) at 07/03/2018 1200 Last data filed at 07/03/2018 1006 Gross per 24 hour  Intake 1443.6 ml  Output 1075 ml  Net 368.6 ml   Filed Weights   07/01/18 0500 07/02/18 0525 07/03/18 0613  Weight: 72.5 kg 73.3 kg 74 kg   Examination: Physical Exam  Constitutional: Patient is an elderly, overweight African-American male currently no acute distress laying in bed Respiratory: To auscultation bilaterally no appreciable wheezing, rales rhonchi.  Patient not tachypneic or using any accessory muscles to breathe Cardiovascular: Regular rate and rhythm.  Lower extremity edema is improved Abdomen: Soft, midline abdominal incision with staples and sutures.  Dressing appeared clean dry and intact.  Mildly tender to palpate GU: Deferred.  Is wearing a condom catheter Psychiatric: Pleasant mood and affect.  Intact judgment and insight  Data Reviewed: I have personally reviewed following labs and imaging studies  CBC: Recent Labs  Lab 06/29/18 0500  06/30/18 0254 06/30/18 0326 06/30/18 1000 07/01/18 0500 07/02/18 0322 07/03/18 0910  WBC 7.1  --  5.9  --   --  6.3 4.5 5.5  NEUTROABS 6.0  --  4.8  --   --  5.3 3.5 4.2  HGB  8.3*   < > 10.0* 9.9* 10.2* 10.2* 9.3* 10.0*  HCT 25.1*   < > 30.9* 30.4* 31.8* 31.3* 27.9* 30.2*  MCV 96.2  --  94.2  --   --  95.4 93.3 93.8  PLT 87*  --  90*  --   --  116* 95* 100*   < > = values in this interval not displayed.   Basic Metabolic Panel: Recent Labs  Lab 06/29/18 0500 06/30/18 0254 07/01/18 0500 07/02/18 0322 07/03/18 0910  NA 142 141 144 143 139  K 3.6 3.7 3.8 2.9* 4.0  CL 112* 112* 114* 110 108  CO2 21* 20* 18* 25 23  GLUCOSE 185* 158* 195* 184* 190*  BUN 48* 46* 43* 34* 26*  CREATININE 2.18* 2.32* 2.12* 1.70* 1.62*  CALCIUM 8.7* 8.7* 8.8* 8.4* 8.2*  MG 2.1 2.4 2.1 1.8 1.8  PHOS 2.9 3.1 3.1 2.1* 2.6   GFR: Estimated Creatinine Clearance: 28.5 mL/min (A) (by C-G formula based on SCr of 1.62 mg/dL (H)). Liver Function Tests: Recent Labs  Lab 06/29/18 0500 06/30/18 0254 07/01/18 0500 07/02/18 0322 07/03/18 0910  AST 20 19 16  13* 13*  ALT 11 12 13 11 9   ALKPHOS 62 109 85 67 65  BILITOT 1.2 2.0* 1.8* 1.5* 1.5*  PROT 5.8* 6.0* 6.1* 5.4* 5.8*  ALBUMIN 2.7* 2.7* 2.6* 2.3* 2.5*   No results for input(s): LIPASE, AMYLASE in the last 168 hours. No results for input(s): AMMONIA in the last 168  hours. Coagulation Profile: No results for input(s): INR, PROTIME in the last 168 hours. Cardiac Enzymes: No results for input(s): CKTOTAL, CKMB, CKMBINDEX, TROPONINI in the last 168 hours. BNP (last 3 results) No results for input(s): PROBNP in the last 8760 hours. HbA1C: No results for input(s): HGBA1C in the last 72 hours. CBG: Recent Labs  Lab 07/02/18 0736 07/02/18 1153 07/02/18 1706 07/02/18 2036 07/03/18 0740  GLUCAP 173* 248* 128* 125* 124*   Lipid Profile: No results for input(s): CHOL, HDL, LDLCALC, TRIG, CHOLHDL, LDLDIRECT in the last 72 hours. Thyroid Function Tests: No results for input(s): TSH, T4TOTAL, FREET4, T3FREE, THYROIDAB in the last 72 hours. Anemia Panel: No results for input(s): VITAMINB12, FOLATE, FERRITIN, TIBC, IRON,  RETICCTPCT in the last 72 hours. Sepsis Labs: No results for input(s): PROCALCITON, LATICACIDVEN in the last 168 hours.  Recent Results (from the past 240 hour(s))  MRSA PCR Screening     Status: None   Collection Time: 06/26/18  1:48 AM  Result Value Ref Range Status   MRSA by PCR NEGATIVE NEGATIVE Final    Comment:        The GeneXpert MRSA Assay (FDA approved for NASAL specimens only), is one component of a comprehensive MRSA colonization surveillance program. It is not intended to diagnose MRSA infection nor to guide or monitor treatment for MRSA infections. Performed at [facility], [place] [address] [place],  [address]     Radiology Studies: No results found. Scheduled Meds: . atorvastatin  40 mg Oral q1800  . carvedilol  9.375 mg Oral BID WC  . chlorhexidine  15 mL Mouth Rinse BID  . escitalopram  10 mg Oral Daily  . feeding supplement  1 Container Oral TID BM  . insulin aspart  0-15 Units Subcutaneous TID WC  . mouth rinse  15 mL Mouth Rinse q12n4p  . pantoprazole  40 mg Oral BID  . psyllium  1 packet Oral Daily  . saccharomyces boulardii  250 mg Oral BID  . sodium chloride flush  10-40 mL Intracatheter Q12H  . spironolactone  12.5 mg Oral Daily  . torsemide  20 mg Oral Q48H  . torsemide  40 mg Oral Q48H   Continuous Infusions: . sodium chloride 50 mL (07/02/18 1311)    LOS: 8 days   Kerney Elbe, DO [facility] PAGER is on [place]  If 7PM-7AM, please contact night-coverage [email or web address] Password [email or web address] 07/03/2018, 12:00 PM

## 2018-07-03 NOTE — Discharge Summary (Signed)
[facility] Discharge Summary   Patient ID: [name] MRN: [id number] DOB/AGE: 01/19/1927 84 y.o.  Admit date: 06/25/2018 Discharge date: 07/04/2018  Admitting Diagnosis: Ventral incisional hernia with ischemic small intestine, possible contained perforation   Discharge Diagnosis Patient Active Problem List   Diagnosis Date Noted  . Perforated small intestine s/p SB resection 06/26/2018 06/30/2018  . CKD (chronic kidney disease) stage 3, GFR 30-59 ml/min (HCC) 06/30/2018  . Acute esophagitis 06/30/2018  . Gastritis 06/30/2018  . Hematemesis 06/29/2018  . Thrombocytopenia ([place]) 06/27/2018  . Ventral incisional hernia 06/25/2018  . Small bowel ischemia ([place]) 06/25/2018  . Orthostatic hypotension 06/29/2015  . VT (ventricular tachycardia) ([place])   . C1 cervical fracture ([place]) 06/24/2015  . Anemia of chronic disease 06/24/2015  . Laceration 06/24/2015  . Intractable pain 06/24/2015  . A-fib ([place]) 06/24/2015  . Hypertension   . GERD (gastroesophageal reflux disease)   . Protein-calorie malnutrition, severe ([name]) 11/02/2014  . Partial small bowel obstruction ([place]) 10/31/2014  . DM type 2 causing vascular disease ([place]) 10/31/2014  . Do not resuscitate 10/31/2014  . Preoperative cardiovascular examination 10/31/2014  . Ischemic cardiomyopathy   . Atrial flutter ([place]) 09/02/2014  . Chronic systolic heart failure ([place]) 07/23/2009  . ICD (implantable cardioverter-defibrillator) in place 07/23/2009  . Coronary atherosclerosis 07/17/2009  . CORONARY ARTERY BYPASS GRAFT, HX OF 07/17/2009    Consultants Gastroenterology Cardiology Internal medicine  Imaging: No results found.  Procedures Dr. Dema Severin (06/26/18) - Exploratory laparotomy, Resection of small bowel x2, Lysis of adhesions x 90 minutes  Dr. Michail Sermon (06/26/18) - Upper endosocpy  Hospital Course:  NEVAN CREIGHTON is an 84yo male PMH extensive cardiac history with AICD/pacemaker, who presented to  [facility] 10/21 with acute onset abdominal pain.  Workup included CT scan which showed right sided abdominal wall hernia containing small intestine which appears ischemic with small contained perforation.  Patient was admitted and taken to the OR the next morning for the above listed procedure. He was monitored in the ICU postoperatively. Cardiology and internal medicine were consulted for assistance with the patient's multiple medical problems. Due to persistently low platelets patient did not receive chemical DVT prophylaxis. On POD#3 he was noted to have bloody output from NG tube therefore GI was consulted and performed an upper endoscopy which revealed gastritis/esophagitis likely secondary to NG tube trauma. Patient was started on PPI, and will hold daily ASA 81mg  for 2 weeks. Mr. Laseter did have an ileus postoperatively as expected, but once bowel function returned the NG tube was removed and his diet was advanced as tolerated. Patient completed 7 days of IV zosyn. He worked with therapies during this admission who recommended SNF when medically stable for discharge. On POD8 the patient was voiding well, tolerating diet, working well with therapies, pain well controlled, vital signs stable, incisions c/d/i and felt stable for discharge to SNF.  Patient will follow up as below and knows to call with questions or concerns.      Allergies as of 07/04/2018   No Known Allergies     Medication List    STOP taking these medications   esomeprazole 20 MG capsule Commonly known as:  NEXIUM   famotidine 20 MG tablet Commonly known as:  PEPCID   tiZANidine 2 MG tablet Commonly known as:  ZANAFLEX     TAKE these medications   acetaminophen 325 MG tablet Commonly known as:  TYLENOL Take 2 tablets (650 mg total) by mouth every 6 (six) hours as needed  for mild pain or fever. What changed:    medication strength  how much to take  when to take this  reasons to take this   acidophilus Caps  capsule Take 1 capsule by mouth daily.   allopurinol 300 MG tablet Commonly known as:  ZYLOPRIM Take 300 mg by mouth daily.   aspirin 81 MG EC tablet Take 1 tablet (81 mg total) by mouth daily. Start taking on:  07/13/2018 What changed:    how much to take  These instructions start on 07/13/2018. If you are unsure what to do until then, ask your doctor or other care provider.   atorvastatin 40 MG tablet Commonly known as:  LIPITOR Take 1 tablet (40 mg total) by mouth daily at 6 PM.   carvedilol 6.25 MG tablet Commonly known as:  COREG Take 1.5 tablets (9.375 mg total) by mouth 2 (two) times daily with a meal.   chlorhexidine 0.12 % solution Commonly known as:  PERIDEX 15 mLs by Mouth Rinse route 2 (two) times daily.   escitalopram 10 MG tablet Commonly known as:  LEXAPRO Take 1 tablet (10 mg total) by mouth daily.   FINASTERIDE PO Take 1 tablet by mouth every evening.   ketotifen 0.025 % ophthalmic solution Commonly known as:  ZADITOR Place 1 drop into both eyes daily as needed (dry eyes).   LORazepam 0.5 MG tablet Commonly known as:  ATIVAN Take 0.5 mg by mouth 2 (two) times daily as needed for anxiety.   NITROSTAT 0.4 MG SL tablet Generic drug:  nitroGLYCERIN Place 0.4 mg under the tongue every 5 (five) minutes as needed for chest pain (MAX 3 TABLETS).   oxyCODONE 5 MG immediate release tablet Commonly known as:  Oxy IR/ROXICODONE Take 1 tablet (5 mg total) by mouth every 6 (six) hours as needed for severe pain. What changed:  reasons to take this   pantoprazole 40 MG tablet Commonly known as:  PROTONIX Take 1 tablet (40 mg total) by mouth 2 (two) times daily.   PATADAY 0.2 % Soln Generic drug:  Olopatadine HCl Place 1 drop into both eyes daily.   saccharomyces boulardii 250 MG capsule Commonly known as:  FLORASTOR Take 1 capsule (250 mg total) by mouth 2 (two) times daily.   spironolactone 25 MG tablet Commonly known as:  ALDACTONE Take 0.5 tablets  (12.5 mg total) by mouth daily.   torsemide 20 MG tablet Commonly known as:  DEMADEX Take 2 tablets (40 mg total) every other day alternating with 1 tablet (20 mg total) every other day.         Contact information for follow-up providers    [facility], [place]. Go on 07/10/2018.   Specialty:  General Surgery Why:  at 10 AM for staple removal. please arrive 30 minutes early to get checked in and fill out any necesarry paperwork. Contact information: [address] [place] [phone]       Ileana Roup, MD. Go on 07/30/2018.   Specialty:  General Surgery Why:  at 10:30 AM for post-operative follow up. please arrive 15 minutes early. Contact information: [place] [address] [phone]            Contact information for after-discharge care    Destination    [facility] .   Service:  Skilled Nursing Contact information: [address] [place] [phone]  Signed: Wellington Hampshire, [facility] 07/03/2018, 3:00 PM Pager: [phone] Mon 7:00 am -11:30 AM Tues-Fri 7:00 am-4:30 pm Sat-Sun 7:00 am-11:30 am

## 2018-07-03 NOTE — Progress Notes (Signed)
OT Cancellation Note  Patient Details Name: [name] MRN: [id number] DOB: Feb 04, 1927  Cancelled Treatment:    Reason Eval/Treat Not Completed: Other (comment)  Pt had just started eating lunch- will check back later in day or next day Kari Baars, [place] [phone] Office- [phone], Edwena Felty D 07/03/2018, 2:51 PM

## 2018-07-04 ENCOUNTER — Other Ambulatory Visit: Payer: Self-pay

## 2018-07-04 LAB — GLUCOSE, CAPILLARY
GLUCOSE-CAPILLARY: 195 mg/dL — AB (ref 70–99)
Glucose-Capillary: 127 mg/dL — ABNORMAL HIGH (ref 70–99)

## 2018-07-04 MED ORDER — ASPIRIN 81 MG PO TBEC: 81.0000 mg | DELAYED_RELEASE_TABLET | Freq: Every day | ORAL | 0 refills | Status: DC

## 2018-07-04 MED ORDER — OXYCODONE HCL 5 MG PO TABS: 5.0000 mg | ORAL_TABLET | Freq: Four times a day (QID) | ORAL | 0 refills | Status: DC | PRN

## 2018-07-04 MED ORDER — ACETAMINOPHEN 325 MG PO TABS: 650.0000 mg | ORAL_TABLET | Freq: Four times a day (QID) | ORAL | Status: DC | PRN

## 2018-07-04 MED ORDER — PANTOPRAZOLE SODIUM 40 MG PO TBEC: 40.0000 mg | DELAYED_RELEASE_TABLET | Freq: Two times a day (BID) | ORAL | Status: DC

## 2018-07-04 MED ORDER — LORAZEPAM 0.5 MG PO TABS: 0.5000 mg | ORAL_TABLET | Freq: Two times a day (BID) | ORAL | 0 refills | Status: AC | PRN

## 2018-07-04 MED ORDER — SACCHAROMYCES BOULARDII 250 MG PO CAPS: 250.0000 mg | ORAL_CAPSULE | Freq: Two times a day (BID) | ORAL | Status: DC

## 2018-07-04 MED ORDER — CHLORHEXIDINE GLUCONATE 0.12 % MT SOLN: 15.0000 mL | Freq: Two times a day (BID) | OROMUCOSAL | 0 refills | Status: AC

## 2018-07-04 NOTE — Progress Notes (Addendum)
Patient is stable at discharge. Vaseline pressre gauze dressing placed on right side neck where IJ catheter was prior. Site is unremarkable and no signs of bleeding.

## 2018-07-04 NOTE — Progress Notes (Signed)
[facility] Progress Note  5 Days Post-Op  Subjective: CC-  Doing well this morning. Denies any abdominal pain, just states that he's hungry. Denies n/v. Tolerating soft diet. Having bowel function, stools still loose.  Objective: Vital signs in last 24 hours: Temp:  [97.9 F (36.6 C)-98.7 F (37.1 C)] 98.7 F (37.1 C) (10/30 0558) Pulse Rate:  [60-75] 62 (10/30 0558) Resp:  [18-22] 18 (10/30 0558) BP: (113-127)/(61-78) 119/78 (10/30 0558) SpO2:  [95 %-98 %] 95 % (10/30 0558) Weight:  [74.9 kg] 74.9 kg (10/30 0500) Last BM Date: 07/01/18  Intake/Output from previous day: 10/29 0701 - 10/30 0700 In: 1320 [P.O.:1320] Out: 1875 [Urine:1875] Intake/Output this shift: No intake/output data recorded.  PE: Gen: Alert, NAD, pleasant HEENT: EOM's intact, pupils equal and round Pulm: effort normal Abd: Soft,nondistended, nontender, +BS,midline incision cdi with sutures and staples in place/no cellulitis or drainage, proximal aspect of wound appears to have some hematoma beneath sutures/staples  Lab Results:  Recent Labs    07/02/18 0322 07/03/18 0910  WBC 4.5 5.5  HGB 9.3* 10.0*  HCT 27.9* 30.2*  PLT 95* 100*   BMET Recent Labs    07/02/18 0322 07/03/18 0910  NA 143 139  K 2.9* 4.0  CL 110 108  CO2 25 23  GLUCOSE 184* 190*  BUN 34* 26*  CREATININE 1.70* 1.62*  CALCIUM 8.4* 8.2*   PT/INR No results for input(s): LABPROT, INR in the last 72 hours. CMP     Component Value Date/Time   NA 139 07/03/2018 0910   K 4.0 07/03/2018 0910   CL 108 07/03/2018 0910   CO2 23 07/03/2018 0910   GLUCOSE 190 (H) 07/03/2018 0910   BUN 26 (H) 07/03/2018 0910   CREATININE 1.62 (H) 07/03/2018 0910   CREATININE 1.56 (H) 06/08/2016 1053   CALCIUM 8.2 (L) 07/03/2018 0910   PROT 5.8 (L) 07/03/2018 0910   ALBUMIN 2.5 (L) 07/03/2018 0910   AST 13 (L) 07/03/2018 0910   ALT 9 07/03/2018 0910   ALKPHOS 65 07/03/2018 0910   BILITOT 1.5 (H) 07/03/2018 0910   GFRNONAA 36 (L) 07/03/2018 0910   GFRNONAA 53 (L) 05/12/2016 1330   GFRAA 42 (L) 07/03/2018 0910   GFRAA 61 05/12/2016 1330   Lipase     Component Value Date/Time   LIPASE 31 10/31/2014 0230       Studies/Results: No results found.  Anti-infectives: Anti-infectives (From admission, onward)   Start     Dose/Rate Route Frequency Ordered Stop   06/26/18 1400  piperacillin-tazobactam (ZOSYN) IVPB 3.375 g     3.375 g 12.5 mL/hr over 240 Minutes Intravenous Every 8 hours 06/26/18 1218 07/03/18 1215   06/26/18 0815  cefoTEtan (CEFOTAN) 2 g in sodium chloride 0.9 % 100 mL IVPB     2 g 200 mL/hr over 30 Minutes Intravenous On call to O.R. 06/26/18 0809 06/26/18 1103   06/26/18 0600  piperacillin-tazobactam (ZOSYN) IVPB 2.25 g  Status:  Discontinued     2.25 g 100 mL/hr over 30 Minutes Intravenous Every 6 hours 06/26/18 0447 06/26/18 1217   06/25/18 2345  piperacillin-tazobactam (ZOSYN) IVPB 3.375 g     3.375 g 100 mL/hr over 30 Minutes Intravenous  Once 06/25/18 2341 06/26/18 0031   06/25/18 2315  ceFEPIme (MAXIPIME) 2 g in sodium chloride 0.9 % 100 mL IVPB  Status:  Discontinued     2 g 200 mL/hr over 30 Minutes Intravenous  Once 06/25/18 2306 06/25/18 2341   06/25/18 2315  metroNIDAZOLE (FLAGYL) IVPB 500 mg  Status:  Discontinued     500 mg 100 mL/hr over 60 Minutes Intravenous Every 8 hours 06/25/18 2306 06/25/18 2341       Assessment/Plan CAD s/p CABG H/o ischemic cardiomyopathy, EF 35% AFTER AICD CKD-III/IV Anemia of chronic disease, Acute blood loss anemia from Upper GI bleed esophagitis/gastritis - per GI, on protonix. Hg stable. Per GI hold ASA x2 weeks Thrombocytopenia DM-II Hypokalemia/hypophosphatemia - resolved  Perforated viscus, abdominal wall hernia S/p ex lap, small bowel resection x2, lysis of adhesions 10/22 Dr. Dema Severin - POD 8 - path:SEGMENT OF SMALL INTESTINE (6.5 CM) SHOWING SEVERELY INFLAMED ANASTOMOTIC SITE WITH ABSCESS IN THE PERI-INTESTINAL SOFT  TISSUE WITH ASSOCIATED SEROSITIS AND SEROSAL ADHESIONS - Prevenna d/c 10/28, daily dry dressing change - continue abdominal binder - tolerating diet and having bowel function  MCE:YEMV diet, Boost ID: Zosyn 10/21>>10/28 DVT: SCD - Notstarting heparin due to low platelets Foley: out Follow up: Dr. Dema Severin  Plan- Patient medically stable for discharge to SNF. Will wait to hear from social worker when bed is available.   LOS: 9 days    Wellington Hampshire , [facility] 07/04/2018, 8:31 AM Pager: [phone] Mon 7:00 am -11:30 AM Tues-Fri 7:00 am-4:30 pm Sat-Sun 7:00 am-11:30 am

## 2018-07-04 NOTE — Telephone Encounter (Signed)
Rx faxed to [facility] (P) [phone], (F) [phone] 

## 2018-07-04 NOTE — Clinical Social Work Placement (Signed)
Patient discharging to [facility] room 105. CSW faxed appropriate documents and confirmed bed at facility. Patient will transfer by PTAR. Patient and friend, [name], notified of discharge.  RN call report: [phone]  CLINICAL SOCIAL WORK PLACEMENT  NOTE  Date:  07/04/2018  Patient Details  Name: [name] MRN: [id number] Date of Birth: April 21, 1927  Clinical Social Work is seeking post-discharge placement for this patient at the [place] level of care (*CSW will initial, date and re-position this form in  chart as items are completed):  Yes   Patient/family provided with [place] Work Department's list of facilities offering this level of care within the geographic area requested by the patient (or if unable, by the patient's family).  Yes   Patient/family informed of their freedom to choose among providers that offer the needed level of care, that participate in Medicare, Medicaid or managed care program needed by the patient, have an available bed and are willing to accept the patient.      Patient/family informed of [facility]'s ownership interest in [facility] and [facility], as well as of the fact that they are under no obligation to receive care at these facilities.  PASRR submitted to EDS on       PASRR number received on       Existing PASRR number confirmed on 07/02/18     FL2 transmitted to all facilities in geographic area requested by pt/family on       FL2 transmitted to all facilities within larger geographic area on 07/02/18     Patient informed that his/her managed care company has contracts with or will negotiate with certain facilities, including the following:        Yes   Patient/family informed of bed offers received.  Patient chooses bed at [facility])     Physician recommends and patient chooses bed at      Patient to be transferred to [facility]) on 07/04/18.  Patient to be  transferred to facility by PTAR     Patient family notified on 07/04/18 of transfer.  Name of family member notified:  [name]     PHYSICIAN       Additional Comment:    _______________________________________________ Pricilla Holm, [place] 07/04/2018, 11:29 AM

## 2018-07-04 NOTE — Progress Notes (Addendum)
Attempted to call report to facility. Unsuccessful, left voice message to call me back. Called [name], friend and POA of patient about eye drops that are in Pharmacy for pick up. [name] said she would pick up medication later today or tomorrow.

## 2018-07-04 NOTE — Progress Notes (Signed)
Wound Care done. New ABD pad on midline and taped at the edges

## 2018-07-05 ENCOUNTER — Non-Acute Institutional Stay (SKILLED_NURSING_FACILITY): Payer: Medicare Other | Admitting: Adult Health

## 2018-07-05 ENCOUNTER — Encounter: Payer: Self-pay | Admitting: Adult Health

## 2018-07-05 DIAGNOSIS — E1169 Type 2 diabetes mellitus with other specified complication: Secondary | ICD-10-CM

## 2018-07-05 DIAGNOSIS — I25810 Atherosclerosis of coronary artery bypass graft(s) without angina pectoris: Secondary | ICD-10-CM

## 2018-07-05 DIAGNOSIS — K2100 Gastro-esophageal reflux disease with esophagitis, without bleeding: Secondary | ICD-10-CM

## 2018-07-05 DIAGNOSIS — I130 Hypertensive heart and chronic kidney disease with heart failure and stage 1 through stage 4 chronic kidney disease, or unspecified chronic kidney disease: Secondary | ICD-10-CM

## 2018-07-05 DIAGNOSIS — E785 Hyperlipidemia, unspecified: Secondary | ICD-10-CM

## 2018-07-05 DIAGNOSIS — I5022 Chronic systolic (congestive) heart failure: Secondary | ICD-10-CM | POA: Diagnosis not present

## 2018-07-05 DIAGNOSIS — N400 Enlarged prostate without lower urinary tract symptoms: Secondary | ICD-10-CM

## 2018-07-05 DIAGNOSIS — N183 Chronic kidney disease, stage 3 (moderate): Secondary | ICD-10-CM

## 2018-07-05 DIAGNOSIS — I480 Paroxysmal atrial fibrillation: Secondary | ICD-10-CM | POA: Diagnosis not present

## 2018-07-05 DIAGNOSIS — F418 Other specified anxiety disorders: Secondary | ICD-10-CM

## 2018-07-05 DIAGNOSIS — E43 Unspecified severe protein-calorie malnutrition: Secondary | ICD-10-CM

## 2018-07-05 DIAGNOSIS — E1159 Type 2 diabetes mellitus with other circulatory complications: Secondary | ICD-10-CM

## 2018-07-05 DIAGNOSIS — M1A39X0 Chronic gout due to renal impairment, multiple sites, without tophus (tophi): Secondary | ICD-10-CM

## 2018-07-05 DIAGNOSIS — K631 Perforation of intestine (nontraumatic): Secondary | ICD-10-CM

## 2018-07-05 DIAGNOSIS — R5381 Other malaise: Secondary | ICD-10-CM

## 2018-07-05 LAB — GLUCOSE, CAPILLARY: Glucose-Capillary: 169 mg/dL — ABNORMAL HIGH (ref 70–99)

## 2018-07-05 NOTE — Progress Notes (Signed)
Location:   [facility] Room Number: 105 B Place of Service:  SNF (31)   CODE STATUS: Full Code  No Known Allergies  Chief Complaint  Patient presents with  . Hospitalization Follow-up    Hospital Follow up    HPI:  He is a 84 year old man who presented to the ED for acute abdominal pain. He was found to have a right sided abdominal wall hernia containing small intestine which appeared ischemic with small contained perforation. He underwent an exploratory lap on 06-26-18 with resection of small bowel; lysis of adhesions. He NG tube placement. He is able to tolerate po intake. He did complete his abt therapy. He is here for short term rehab with his goal to return back home. He denies any uncontrolled pain; he has a good appetite; had a BM yesterday. He denies amy anxiety. He will continue to followed for his chronic illnesses including: afib; systolic heart failure; cad and diabetes.    Past Medical History:  Diagnosis Date  . AICD (automatic cardioverter/defibrillator) present   . Arthritis    "shoulders" (10/31/2014)  . CAD (coronary artery disease)    s/p CABG; s/p Pacemaker  . Diverticulosis of colon   . GERD (gastroesophageal reflux disease)   . Gout   . Hyperlipidemia   . Hypertension   . Ischemic cardiomyopathy    s/p ICD  . On home oxygen therapy    "2L prn" (10/31/2014)  . Paroxysmal ventricular tachycardia ([place])   . SBO (small bowel obstruction) ([place]) 10/31/2014  . Shortness of breath dyspnea    with exertion  . Systolic CHF with reduced left ventricular function, NYHA class 2 ([place])     Past Surgical History:  Procedure Laterality Date  . BI-VENTRICULAR IMPLANTABLE CARDIOVERTER DEFIBRILLATOR UPGRADE N/A 01/22/2014   Procedure: BI-VENTRICULAR IMPLANTABLE CARDIOVERTER DEFIBRILLATOR UPGRADE;  Surgeon: Evans Lance, MD;  Location: [facility] CATH LAB;  Service: Cardiovascular;  Laterality: N/A;  . BOWEL RESECTION    . CARDIAC CATHETERIZATION  03/2011   Archie Endo 01/18/2011  . CARDIAC DEFIBRILLATOR PLACEMENT  07/2003   Archie Endo 01/18/2011  . CATARACT EXTRACTION W/ INTRAOCULAR LENS  IMPLANT, BILATERAL Bilateral   . CATARACT EXTRACTION W/PHACO Right 12/17/2014   Procedure: PHACOEMULSIFICATION CATARACT EXTRACTION WITH IOL IMPLANT RIGHT EYE;  Surgeon: Marylynn Pearson, MD;  Location: [place];  Service: Ophthalmology;  Laterality: Right;  . CHOLECYSTECTOMY    . CORONARY ANGIOPLASTY WITH STENT PLACEMENT  04/2011   2 stents/notes 05/03/2011  . CORONARY ARTERY BYPASS GRAFT  03/2003   CABG X3/notes 01/18/2011  . ESOPHAGOGASTRODUODENOSCOPY (EGD) WITH PROPOFOL N/A 06/29/2018   Procedure: ESOPHAGOGASTRODUODENOSCOPY (EGD) WITH PROPOFOL;  Surgeon: Wilford Corner, MD;  Location: [facility] ENDOSCOPY;  Service: Endoscopy;  Laterality: N/A;  . EYE SURGERY Bilateral    [place]  . IMPLANTABLE CARDIOVERTER DEFIBRILLATOR (ICD) GENERATOR CHANGE Left 10/12/2011   Procedure: ICD GENERATOR CHANGE;  Surgeon: Evans Lance, MD;  Location: [facility] CATH LAB;  Service: Cardiovascular;  Laterality: Left;  . LAPAROTOMY N/A 06/26/2018   Procedure: EXPLORATORY LAPAROTOMY SMALL BOWEL RESECTION X 2;  Surgeon: Ileana Roup, MD;  Location: [facility] ORS;  Service: General;  Laterality: N/A;  . PACEMAKER PLACEMENT    . PACEMAKER REVISION N/A 10/12/2011   Procedure: PACEMAKER REVISION;  Surgeon: Evans Lance, MD;  Location: [facility] CATH LAB;  Service: Cardiovascular;  Laterality: N/A;  . TEE WITH CARDIOVERSION  03/2003   Archie Endo 01/18/2011  . TONSILLECTOMY      Social History   Socioeconomic History  .  Marital status: Divorced    Spouse name: Not on file  . Number of children: Not on file  . Years of education: Not on file  . Highest education level: Not on file  Occupational History  . Occupation: Retired  Scientific laboratory technician  . Financial resource strain: Not on file  . Food insecurity:    Worry: Not on file    Inability: Not on file  . Transportation needs:    Medical: Not on file    Non-medical: Not  on file  Tobacco Use  . Smoking status: Never Smoker  . Smokeless tobacco: Former Systems developer    Types: Chew  . Tobacco comment: no chew in over 3 years  Substance and Sexual Activity  . Alcohol use: Yes    Comment: "might take a drink during the holidays"  . Drug use: No  . Sexual activity: Never  Lifestyle  . Physical activity:    Days per week: Not on file    Minutes per session: Not on file  . Stress: Not on file  Relationships  . Social connections:    Talks on phone: Not on file    Gets together: Not on file    Attends religious service: Not on file    Active member of club or organization: Not on file    Attends meetings of clubs or organizations: Not on file    Relationship status: Not on file  . Intimate partner violence:    Fear of current or ex partner: Not on file    Emotionally abused: Not on file    Physically abused: Not on file    Forced sexual activity: Not on file  Other Topics Concern  . Not on file  Social History Narrative  . Not on file   Family History  Problem Relation Age of Onset  . Heart Problems Mother   . Diabetes Mother   . Heart Problems Brother       VITAL SIGNS BP 138/64   Pulse 74   Temp 98 F (36.7 C)   Resp 18   Ht 5\' 4"  (1.626 m)   Wt 159 lb 1.6 oz (72.2 kg)   SpO2 98%   BMI 27.31 kg/m   Outpatient Encounter Medications as of 07/05/2018  Medication Sig  . acetaminophen (TYLENOL) 325 MG tablet Take 2 tablets (650 mg total) by mouth every 6 (six) hours as needed for mild pain or fever.  Marland Kitchen acidophilus (RISAQUAD) CAPS capsule Take 1 capsule by mouth daily.  Marland Kitchen allopurinol (ZYLOPRIM) 300 MG tablet Take 300 mg by mouth daily.    Derrill Memo ON 07/13/2018] aspirin (RA ASPIRIN ADULT LOW STRENGTH) 81 MG EC tablet Take 1 tablet (81 mg total) by mouth daily.  Marland Kitchen atorvastatin (LIPITOR) 40 MG tablet Take 1 tablet (40 mg total) by mouth daily at 6 PM.  . carvedilol (COREG) 6.25 MG tablet Take 1.5 tablets (9.375 mg total) by mouth 2 (two) times  daily with a meal.  . chlorhexidine (PERIDEX) 0.12 % solution 15 mLs by Mouth Rinse route 2 (two) times daily.  Marland Kitchen escitalopram (LEXAPRO) 10 MG tablet Take 1 tablet (10 mg total) by mouth daily.  . finasteride (PROSCAR) 5 MG tablet Take 5 mg by mouth every evening.  Marland Kitchen ketotifen (ZADITOR) 0.025 % ophthalmic solution Place 1 drop into both eyes daily as needed (dry eyes).   . LORazepam (ATIVAN) 0.5 MG tablet Take 1 tablet (0.5 mg total) by mouth 2 (two) times daily as needed for  anxiety.  Marland Kitchen NITROSTAT 0.4 MG SL tablet Place 0.4 mg under the tongue every 5 (five) minutes as needed for chest pain (MAX 3 TABLETS).   . NON FORMULARY Diet Type:  Regular diet - Regular texture  . oxyCODONE (OXY IR/ROXICODONE) 5 MG immediate release tablet Take 1 tablet (5 mg total) by mouth every 6 (six) hours as needed for severe pain.  . pantoprazole (PROTONIX) 40 MG tablet Take 1 tablet (40 mg total) by mouth 2 (two) times daily.  Marland Kitchen PATADAY 0.2 % SOLN Place 1 drop into both eyes daily.   Marland Kitchen saccharomyces boulardii (FLORASTOR) 250 MG capsule Take 1 capsule (250 mg total) by mouth 2 (two) times daily.  Marland Kitchen spironolactone (ALDACTONE) 25 MG tablet Take 0.5 tablets (12.5 mg total) by mouth daily.  Marland Kitchen torsemide (DEMADEX) 20 MG tablet Take 2 tablets (40 mg total) every other day alternating with 1 tablet (20 mg total) every other day.  . [DISCONTINUED] FINASTERIDE PO Take 1 tablet by mouth every evening.   No facility-administered encounter medications on file as of 07/05/2018.      SIGNIFICANT DIAGNOSTIC EXAMS  TODAY:   06-25-18: ct of abdomen and pelvis:  1. Right ventral abdominal hernia containing loops of small bowel, with suspected pneumatosis and likely perforation at the site of prior anastomosis. This likely indicates a short segment of ischemic bowel. 2. 5 x 5 mm stone in the distal common bile duct without biliary dilatation. 3. Right inguinal hernia containing loop of nondilated small bowel. 4.  Aortic  Atherosclerosis   06-26-18: chest x-ray:  1. Right IJ central venous line tip overlies expected right atrium. No pneumothorax. 2. Stable cardiomegaly with mild basilar volume loss. 3. No change in pacer and AICD leads.  06-29-18: kub:  1. Enteric tube placed to the stomach. Side hole could be at or just inside the GEJ. Advance 4-5 centimeters for more optimal placement. 2. Interval postoperative changes to the abdominal wall. Negative visible bowel gas pattern.  07-01-18: chest x-ray: No evidence of acute cardiopulmonary disease. Stable support apparatus as above.  LABS REVIEWED; TODAY:   06-25-18: wbc 9.0; hgb 12.0; hct 36.3; mcv 94.0 plt 147; glucose 200; bun 66; creat 2.23; k+ 4.3; na++133; ca 9.4 liver normal albumin 4.1  06-26-18: BNP 433.0 pre-albumin 16.1  06-27-18: wbc 5.8; hgb 9.0; hct 27.7; mcv 97.5 plt 73 glucose 162; bun 52; creat 2.11; k+ 4.0; na++ 136; ca 8.2; hgb a1c 7.3  06-29-18: wbc 7.1; hgb 8.3; hct 25.1; mcv 96.2; plt 87; glucose 185; bun 48; creat 2.18; k+ 3.6; na++ 142; ca 8.7; liver normal albumin 2.7; mag 2.1; phos 2.9  06-30-18: wbc 5.9; hgb 10.0; hct 30.9; mcv 94.2; plt 90; glucose 158; bun 46; creat 2.32; k+ 3.7; na++ 141; ca 8.7 total bili 2.0; albumin 2.7 mag 2.6 phos 3.1 07-02-18: wbc 4.5; hgb 9.3; hct 27.9; mcv 93.3; plt 95 glucose 184; bun 34; creat 1.70; k+ 2.9; na++ 143; ca 8.4; total bili 1.5; albumin 2.3 pre-albumin 10.2  07-03-18: wbc 5.5; hgb 10.0 hct 30.2; mcv 93.8; plt 100; glucose 190; bun 26; creat 1.62; k+ 4.0; na++ 141; ca 8.2; total bili 1.5; albumin 2.5 mag 1.8; phos 2.6     Review of Systems  Constitutional: Negative for malaise/fatigue.  Respiratory: Negative for cough and shortness of breath.   Cardiovascular: Negative for chest pain, palpitations and leg swelling.  Gastrointestinal: Positive for abdominal pain. Negative for constipation and heartburn.       Pain is managed;  had BM yesterday   Musculoskeletal: Negative for back pain,  joint pain and myalgias.  Skin: Negative.   Neurological: Negative for dizziness.  Psychiatric/Behavioral: The patient is not nervous/anxious.     Physical Exam  Constitutional: He is oriented to person, place, and time. He appears well-developed and well-nourished. No distress.  Neck: No thyromegaly present.  Cardiovascular: Normal rate, regular rhythm, normal heart sounds and intact distal pulses.  Pulmonary/Chest: Effort normal and breath sounds normal. No respiratory distress.  Abdominal: Soft. Bowel sounds are normal. He exhibits no distension. There is no tenderness.  Abdominal binder in place   Musculoskeletal: He exhibits no edema.  Is able to move all extremities   Lymphadenopathy:    He has no cervical adenopathy.  Neurological: He is alert and oriented to person, place, and time.  Skin: Skin is warm and dry. He is not diaphoretic.  Staples intact without signs of infection present.   Psychiatric: He has a normal mood and affect.     ASSESSMENT/ PLAN:  TODAY;   1. Paroxymal A-fib: heart is status post pacemaker and ICD placement rate is stable; will continue coreg 9.375 mg twice daily for rate control will restart asa 81 mg on 07-13-18.   2. Chronic systolic heart failure: is status post pacemaker insertion and ICD placement : is stable will continue aldactone 12.5 mg daily demadex 40 mg daily alternating with 20 mg daily; coreg 9.375 mg twice daily   3. Atherosclerosis of coronary artery bypass graft of native heart without angina pectoris: is status post CABG X3; ICD and pacemaker placement: no complaints of chest pain: will continue ntg prn and will restart asa 81 mg daily on 07-13-18  4. 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  DM type 2 causing vascular disease: is stable hgb a1c 7.3; will monitor his status   6.   Perforated small intestine s/p SB resection 06-26-18: is stable is status post exploratory lap: will monitor and will follow up with surgeon as indicated has oxycodone 5 mg every 6 hours as needed   7. GERD with esophagitis: is stable will continue protonix 40 mg twice daily   8. Protein calorie malnutrition, severe: is without change: albumin 2.5; pre-albumin 10.2; will continue supplements as directed.   9. Dyslipidemia associated with type 2 diabetes mellitus: is stable will continue lipitor 40 mg daily   10. BPH without obstruction/ urinary symptoms: is stable will continue proscar 5 mg daily   11. Depression with anxiety: is stable will continue lexapro 10 mg daily and has ativan 0.5 mg twice daily for 14 days.   12. Physical deconditioning: is stable will continue therapy as directed to upon his level of independence with his adls.   13. Chronic gout due to renal impairment of multiple sties without tophus is stable will continue allopurinol 300 mg daily          MD is aware of resident's narcotic use and is in agreement with current plan of care. We will attempt to wean resident as apropriate   Ok Edwards NP [facility]  Contact [phone] Monday through Friday 8am- 5pm  After hours call [phone]

## 2018-07-08 DIAGNOSIS — M1A30X0 Chronic gout due to renal impairment, unspecified site, without tophus (tophi): Secondary | ICD-10-CM | POA: Insufficient documentation

## 2018-07-08 DIAGNOSIS — E785 Hyperlipidemia, unspecified: Secondary | ICD-10-CM

## 2018-07-08 DIAGNOSIS — R5381 Other malaise: Secondary | ICD-10-CM | POA: Insufficient documentation

## 2018-07-08 DIAGNOSIS — I5020 Unspecified systolic (congestive) heart failure: Secondary | ICD-10-CM

## 2018-07-08 DIAGNOSIS — I130 Hypertensive heart and chronic kidney disease with heart failure and stage 1 through stage 4 chronic kidney disease, or unspecified chronic kidney disease: Secondary | ICD-10-CM

## 2018-07-08 DIAGNOSIS — N400 Enlarged prostate without lower urinary tract symptoms: Secondary | ICD-10-CM | POA: Insufficient documentation

## 2018-07-08 DIAGNOSIS — E1169 Type 2 diabetes mellitus with other specified complication: Secondary | ICD-10-CM | POA: Insufficient documentation

## 2018-07-08 DIAGNOSIS — F418 Other specified anxiety disorders: Secondary | ICD-10-CM | POA: Insufficient documentation

## 2018-07-08 DIAGNOSIS — N183 Chronic kidney disease, stage 3 (moderate): Secondary | ICD-10-CM

## 2018-07-23 ENCOUNTER — Telehealth: Payer: Self-pay

## 2018-07-23 NOTE — Telephone Encounter (Signed)
Confirmed remote transmission w/ pt sister.  Pt had surgery and is currently in rehab.

## 2018-07-23 NOTE — Telephone Encounter (Signed)
Received voice mail message from friend [name] stating patient should be discharged from rehab this week and can schedule remote transmission next week.

## 2018-07-26 ENCOUNTER — Other Ambulatory Visit (HOSPITAL_COMMUNITY): Payer: Self-pay

## 2018-07-26 DIAGNOSIS — I5022 Chronic systolic (congestive) heart failure: Secondary | ICD-10-CM

## 2018-07-26 NOTE — Progress Notes (Signed)
No ICM remote transmission received for 07/23/2018 (inpatient rehab) and next ICM transmission scheduled for 08/09/2018.

## 2018-08-01 ENCOUNTER — Emergency Department (HOSPITAL_COMMUNITY): Payer: Medicare Other

## 2018-08-01 ENCOUNTER — Emergency Department (HOSPITAL_COMMUNITY)
Admission: EM | Admit: 2018-08-01 | Discharge: 2018-08-01 | Disposition: A | Discharge status: Home / Self Care | Payer: Medicare Other | Attending: Emergency Medicine | Admitting: Emergency Medicine

## 2018-08-01 ENCOUNTER — Other Ambulatory Visit: Payer: Self-pay

## 2018-08-01 ENCOUNTER — Encounter (HOSPITAL_COMMUNITY): Payer: Self-pay

## 2018-08-01 DIAGNOSIS — I2510 Atherosclerotic heart disease of native coronary artery without angina pectoris: Secondary | ICD-10-CM | POA: Diagnosis not present

## 2018-08-01 DIAGNOSIS — Z951 Presence of aortocoronary bypass graft: Secondary | ICD-10-CM | POA: Diagnosis not present

## 2018-08-01 DIAGNOSIS — Z955 Presence of coronary angioplasty implant and graft: Secondary | ICD-10-CM | POA: Diagnosis not present

## 2018-08-01 DIAGNOSIS — N183 Chronic kidney disease, stage 3 (moderate): Secondary | ICD-10-CM | POA: Insufficient documentation

## 2018-08-01 DIAGNOSIS — I130 Hypertensive heart and chronic kidney disease with heart failure and stage 1 through stage 4 chronic kidney disease, or unspecified chronic kidney disease: Secondary | ICD-10-CM | POA: Insufficient documentation

## 2018-08-01 DIAGNOSIS — I5022 Chronic systolic (congestive) heart failure: Secondary | ICD-10-CM | POA: Insufficient documentation

## 2018-08-01 DIAGNOSIS — Z950 Presence of cardiac pacemaker: Secondary | ICD-10-CM | POA: Diagnosis not present

## 2018-08-01 DIAGNOSIS — Z79899 Other long term (current) drug therapy: Secondary | ICD-10-CM | POA: Insufficient documentation

## 2018-08-01 DIAGNOSIS — E1122 Type 2 diabetes mellitus with diabetic chronic kidney disease: Secondary | ICD-10-CM | POA: Diagnosis not present

## 2018-08-01 DIAGNOSIS — R55 Syncope and collapse: Secondary | ICD-10-CM | POA: Diagnosis not present

## 2018-08-01 DIAGNOSIS — R531 Weakness: Secondary | ICD-10-CM | POA: Diagnosis present

## 2018-08-01 LAB — CBC
HCT: 33.1 % — ABNORMAL LOW (ref 39.0–52.0)
HEMOGLOBIN: 10.9 g/dL — AB (ref 13.0–17.0)
MCH: 30 pg (ref 26.0–34.0)
MCHC: 32.9 g/dL (ref 30.0–36.0)
MCV: 91.2 fL (ref 80.0–100.0)
NRBC: 0 % (ref 0.0–0.2)
Platelets: 145 10*3/uL — ABNORMAL LOW (ref 150–400)
RBC: 3.63 MIL/uL — ABNORMAL LOW (ref 4.22–5.81)
RDW: 14.6 % (ref 11.5–15.5)
WBC: 6.5 10*3/uL (ref 4.0–10.5)

## 2018-08-01 LAB — COMPREHENSIVE METABOLIC PANEL
ALT: 18 U/L (ref 0–44)
ANION GAP: 11 (ref 5–15)
AST: 20 U/L (ref 15–41)
Albumin: 3 g/dL — ABNORMAL LOW (ref 3.5–5.0)
Alkaline Phosphatase: 102 U/L (ref 38–126)
BUN: 28 mg/dL — ABNORMAL HIGH (ref 8–23)
CALCIUM: 8.8 mg/dL — AB (ref 8.9–10.3)
CO2: 22 mmol/L (ref 22–32)
Chloride: 99 mmol/L (ref 98–111)
Creatinine, Ser: 1.58 mg/dL — ABNORMAL HIGH (ref 0.61–1.24)
GFR, EST AFRICAN AMERICAN: 44 mL/min — AB (ref >60)
GFR, EST NON AFRICAN AMERICAN: 38 mL/min — AB (ref >60)
Glucose, Bld: 159 mg/dL — ABNORMAL HIGH (ref 70–99)
Potassium: 3.1 mmol/L — ABNORMAL LOW (ref 3.5–5.1)
SODIUM: 132 mmol/L — AB (ref 135–145)
TOTAL PROTEIN: 6.5 g/dL (ref 6.5–8.1)
Total Bilirubin: 1.1 mg/dL (ref 0.3–1.2)

## 2018-08-01 LAB — URINALYSIS, ROUTINE W REFLEX MICROSCOPIC
Bacteria, UA: NONE SEEN
Bilirubin Urine: NEGATIVE
Glucose, UA: NEGATIVE mg/dL
KETONES UR: NEGATIVE mg/dL
Leukocytes, UA: NEGATIVE
Nitrite: NEGATIVE
PH: 5 (ref 5.0–8.0)
Protein, ur: NEGATIVE mg/dL
SPECIFIC GRAVITY, URINE: 1.009 (ref 1.005–1.030)

## 2018-08-01 LAB — I-STAT TROPONIN, ED: TROPONIN I, POC: 0.03 ng/mL (ref 0.00–0.08)

## 2018-08-01 MED ORDER — SODIUM CHLORIDE 0.9 % IV BOLUS
1000.0000 mL | Freq: Once | INTRAVENOUS | Status: AC
  Administered 2018-08-01: 1000 mL via INTRAVENOUS

## 2018-08-01 MED ORDER — POTASSIUM CHLORIDE CRYS ER 20 MEQ PO TBCR
40.0000 meq | EXTENDED_RELEASE_TABLET | Freq: Once | ORAL | Status: DC
  Filled 2018-08-01: qty 2

## 2018-08-01 MED ORDER — POTASSIUM CHLORIDE CRYS ER 20 MEQ PO TBCR: 20.0000 meq | EXTENDED_RELEASE_TABLET | Freq: Every day | ORAL | 0 refills | Status: DC

## 2018-08-01 NOTE — ED Triage Notes (Signed)
[facility]- pt called out for fall and when EMS found him he was hypotensive and pale. Pt alert and oriented with EMS but has had multiple falls. Pt had perforated bowel repair recently and was d/c from rehab center 1 week ago.

## 2018-08-01 NOTE — ED Notes (Signed)
Pt ambulates with assistance x1. Pt feels 'heavy' upon standing. Pt appears steady and walks well. Does  Not shuffle feet. When asked, pt says he feels like he is at "30%".

## 2018-08-01 NOTE — ED Notes (Signed)
Patient verbalizes understanding of discharge instructions. Opportunity for questioning and answers were provided. Armband removed by staff, pt discharged from ED.  

## 2018-08-01 NOTE — ED Notes (Signed)
Please call family member, viriam, at [phone]

## 2018-08-01 NOTE — ED Provider Notes (Signed)
[facility]ERGENCY DEPARTMENT Provider Note   CSN: [id number] Arrival date & time: 08/01/18  1445     History   Chief Complaint Chief Complaint  Patient presents with  . Loss of Consciousness    HPI [name] is a 84 y.o. male.  Patient presents s/p syncopal event at home. Pt s/p recent d/c from e[facility]/rehab. Since being home, has felt generally weak for past several days. Today was standing in kitchen, felt lightheaded, and slowly lowered self to floor, notes brief loc. Denies injury or pain prior to, or post event. Denies chest pain or discomfort, currently or recently. No sob or unusual doe. Denies headache. No neck or back pain. No abd pain. No nvd. No dysuria or gu c/o. Pt unaware of change in meds. Denies blood loss or melena.   The history is provided by the patient.  Loss of Consciousness   Pertinent negatives include abdominal pain, back pain, chest pain, confusion, fever, headaches, palpitations, vomiting and weakness.    Past Medical History:  Diagnosis Date  . AICD (automatic cardioverter/defibrillator) present   . Arthritis    "shoulders" (10/31/2014)  . CAD (coronary artery disease)    s/p CABG; s/p Pacemaker  . Diverticulosis of colon   . GERD (gastroesophageal reflux disease)   . Gout   . Hyperlipidemia   . Hypertension   . Ischemic cardiomyopathy    s/p ICD  . On home oxygen therapy    "2L prn" (10/31/2014)  . Paroxysmal ventricular tachycardia ([place])   . SBO (small bowel obstruction) ([place]) 10/31/2014  . Shortness of breath dyspnea    with exertion  . Systolic CHF with reduced left ventricular function, NYHA class 2 [facility])     Patient Active Problem List   Diagnosis Date Noted  . Hypertensive heart disease with systolic heart failure and stage 3 chronic kidney disease ([place]) 07/08/2018  . Dyslipidemia associated with type 2 diabetes mellitus ([place]) 07/08/2018  . BPH without obstruction/lower urinary tract symptoms 07/08/2018  .  Depression with anxiety 07/08/2018  . Physical deconditioning 07/08/2018  . Chronic gout due to renal impairment 07/08/2018  . Perforated small intestine s/p SB resection 06/26/2018 06/30/2018  . CKD (chronic kidney disease) stage 3, GFR 30-59 ml/min (HCC) 06/30/2018  . Acute esophagitis 06/30/2018  . Gastritis 06/30/2018  . Hematemesis 06/29/2018  . Thrombocytopenia ([place]) 06/27/2018  . Ventral incisional hernia 06/25/2018  . Small bowel ischemia ([place]) 06/25/2018  . Orthostatic hypotension 06/29/2015  . C1 cervical fracture ([place]) 06/24/2015  . Anemia of chronic disease 06/24/2015  . A-fib ([place]) 06/24/2015  . Hypertension   . GERD (gastroesophageal reflux disease)   . Protein-calorie malnutrition, severe ([place]) 11/02/2014  . Partial small bowel obstruction ([place]) 10/31/2014  . DM type 2 causing vascular disease ([place]) 10/31/2014  . Do not resuscitate 10/31/2014  . Preoperative cardiovascular examination 10/31/2014  . Ischemic cardiomyopathy   . Atrial flutter ([place]) 09/02/2014  . Chronic systolic heart failure ([place]) 07/23/2009  . ICD (implantable cardioverter-defibrillator) in place 07/23/2009  . Coronary atherosclerosis 07/17/2009  . CORONARY ARTERY BYPASS GRAFT, HX OF 07/17/2009    Past Surgical History:  Procedure Laterality Date  . BI-VENTRICULAR IMPLANTABLE CARDIOVERTER DEFIBRILLATOR UPGRADE N/A 01/22/2014   Procedure: BI-VENTRICULAR IMPLANTABLE CARDIOVERTER DEFIBRILLATOR UPGRADE;  Surgeon: Evans Lance, MD;  Location: [facility] CATH LAB;  Service: Cardiovascular;  Laterality: N/A;  . BOWEL RESECTION    . CARDIAC CATHETERIZATION  03/2011   Archie Endo 01/18/2011  . CARDIAC DEFIBRILLATOR PLACEMENT  07/2003   /  notes 01/18/2011  . CATARACT EXTRACTION W/ INTRAOCULAR LENS  IMPLANT, BILATERAL Bilateral   . CATARACT EXTRACTION W/PHACO Right 12/17/2014   Procedure: PHACOEMULSIFICATION CATARACT EXTRACTION WITH IOL IMPLANT RIGHT EYE;  Surgeon: Marylynn Pearson, MD;  Location: [facility];  Service:  Ophthalmology;  Laterality: Right;  . CHOLECYSTECTOMY    . CORONARY ANGIOPLASTY WITH STENT PLACEMENT  04/2011   2 stents/notes 05/03/2011  . CORONARY ARTERY BYPASS GRAFT  03/2003   CABG X3/notes 01/18/2011  . ESOPHAGOGASTRODUODENOSCOPY (EGD) WITH PROPOFOL N/A 06/29/2018   Procedure: ESOPHAGOGASTRODUODENOSCOPY (EGD) WITH PROPOFOL;  Surgeon: Wilford Corner, MD;  Location: [facility] ENDOSCOPY;  Service: Endoscopy;  Laterality: N/A;  . EYE SURGERY Bilateral    [place]  . IMPLANTABLE CARDIOVERTER DEFIBRILLATOR (ICD) GENERATOR CHANGE Left 10/12/2011   Procedure: ICD GENERATOR CHANGE;  Surgeon: Evans Lance, MD;  Location: [facility] CATH LAB;  Service: Cardiovascular;  Laterality: Left;  . LAPAROTOMY N/A 06/26/2018   Procedure: EXPLORATORY LAPAROTOMY SMALL BOWEL RESECTION X 2;  Surgeon: Ileana Roup, MD;  Location: [facility] ORS;  Service: General;  Laterality: N/A;  . PACEMAKER PLACEMENT    . PACEMAKER REVISION N/A 10/12/2011   Procedure: PACEMAKER REVISION;  Surgeon: Evans Lance, MD;  Location: [facility] CATH LAB;  Service: Cardiovascular;  Laterality: N/A;  . TEE WITH CARDIOVERSION  03/2003   Archie Endo 01/18/2011  . TONSILLECTOMY          Home Medications    Prior to Admission medications   Medication Sig Start Date End Date Taking? Authorizing Provider  acetaminophen (TYLENOL) 325 MG tablet Take 2 tablets (650 mg total) by mouth every 6 (six) hours as needed for mild pain or fever. 07/04/18   Meuth, Brooke A, PA-C  acidophilus (RISAQUAD) CAPS capsule Take 1 capsule by mouth daily.    [provider]  allopurinol (ZYLOPRIM) 300 MG tablet Take 300 mg by mouth daily.      [provider]  aspirin (RA ASPIRIN ADULT LOW STRENGTH) 81 MG EC tablet Take 1 tablet (81 mg total) by mouth daily. 07/13/18   Meuth, Brooke A, PA-C  atorvastatin (LIPITOR) 40 MG tablet Take 1 tablet (40 mg total) by mouth daily at 6 PM. 03/06/18   Larey Dresser, MD  carvedilol (COREG) 6.25 MG tablet Take 1.5 tablets (9.375  mg total) by mouth 2 (two) times daily with a meal. 03/27/18   Larey Dresser, MD  chlorhexidine (PERIDEX) 0.12 % solution 15 mLs by Mouth Rinse route 2 (two) times daily. 07/04/18   Meuth, Brooke A, PA-C  escitalopram (LEXAPRO) 10 MG tablet Take 1 tablet (10 mg total) by mouth daily. 06/28/15   Ghimire, Henreitta Leber, MD  finasteride (PROSCAR) 5 MG tablet Take 5 mg by mouth every evening. 07/04/18   [provider]  ketotifen (ZADITOR) 0.025 % ophthalmic solution Place 1 drop into both eyes daily as needed (dry eyes).     [provider]  LORazepam (ATIVAN) 0.5 MG tablet Take 1 tablet (0.5 mg total) by mouth 2 (two) times daily as needed for anxiety. 07/04/18   Gerlene Fee, NP  NITROSTAT 0.4 MG SL tablet Place 0.4 mg under the tongue every 5 (five) minutes as needed for chest pain (MAX 3 TABLETS).  12/05/12   [provider]  NON FORMULARY Diet Type:  Regular diet - Regular texture    [provider]  oxyCODONE (OXY IR/ROXICODONE) 5 MG immediate release tablet Take 1 tablet (5 mg total) by mouth every 6 (six) hours as needed for  severe pain. 07/04/18   Gerlene Fee, NP  pantoprazole (PROTONIX) 40 MG tablet Take 1 tablet (40 mg total) by mouth 2 (two) times daily. 07/04/18   Meuth, Brooke A, PA-C  PATADAY 0.2 % SOLN Place 1 drop into both eyes daily.  01/14/14   [provider]  saccharomyces boulardii (FLORASTOR) 250 MG capsule Take 1 capsule (250 mg total) by mouth 2 (two) times daily. 07/04/18   Meuth, Blaine Hamper, PA-C  spironolactone (ALDACTONE) 25 MG tablet Take 0.5 tablets (12.5 mg total) by mouth daily. 05/29/18   Larey Dresser, MD  torsemide (DEMADEX) 20 MG tablet Take 2 tablets (40 mg total) every other day alternating with 1 tablet (20 mg total) every other day. 05/21/18   Larey Dresser, MD    Family History Family History  Problem Relation Age of Onset  . Heart Problems Mother   . Diabetes Mother   . Heart Problems Brother      Social History Social History   Tobacco Use  . Smoking status: Never Smoker  . Smokeless tobacco: Former Systems developer    Types: Chew  . Tobacco comment: no chew in over 3 years  Substance Use Topics  . Alcohol use: Yes    Comment: "might take a drink during the holidays"  . Drug use: No     Allergies   Patient has no known allergies.   Review of Systems Review of Systems  Constitutional: Negative for fever.  HENT: Negative for sore throat.   Eyes: Negative for visual disturbance.  Respiratory: Negative for cough and shortness of breath.   Cardiovascular: Positive for syncope. Negative for chest pain, palpitations and leg swelling.  Gastrointestinal: Negative for abdominal pain, blood in stool, diarrhea and vomiting.  Endocrine: Negative for polyuria.  Genitourinary: Negative for dysuria and flank pain.  Musculoskeletal: Negative for back pain and neck pain.  Skin: Negative for rash.  Neurological: Negative for weakness, numbness and headaches.  Hematological: Does not bruise/bleed easily.  Psychiatric/Behavioral: Negative for confusion.     Physical Exam Updated Vital Signs BP (!) 140/49 (BP Location: Left Arm)   Pulse 60   Temp 97.8 F (36.6 C) (Oral)   Resp 12   SpO2 100%   Physical Exam  Constitutional: He appears well-developed and well-nourished.  HENT:  Head: Atraumatic.  Mouth/Throat: Oropharynx is clear and moist.  Eyes: Pupils are equal, round, and reactive to light. Conjunctivae are normal.  Neck: Neck supple. No tracheal deviation present.  No bruits.   Cardiovascular: Normal rate, regular rhythm, normal heart sounds and intact distal pulses. Exam reveals no gallop and no friction rub.  No murmur heard. Pulmonary/Chest: Effort normal and breath sounds normal. No accessory muscle usage. No respiratory distress.  Abdominal: Soft. Bowel sounds are normal. He exhibits no distension. There is no tenderness.  Open abd wound/ABD's/binder, wound granulating,  no necrotic tissue, no purulent discharge, no cellulitis.  No pulsatile mass.   Genitourinary:  Genitourinary Comments: No cva tenderness.   Musculoskeletal: He exhibits no edema or tenderness.  CTLS spine, non tender, aligned, no step off. Good rom bil ext without pain or focal bony tenderness.   Neurological: He is alert.  Alert, oriented. Speech clear/fluent. Motor intact bil, stre 5/5. sens grossly intact.   Skin: Skin is warm and dry. No rash noted.  Psychiatric: He has a normal mood and affect.  Nursing note and vitals reviewed.    ED Treatments / Results  Labs (all labs ordered are listed, but  only abnormal results are displayed) Results for orders placed or performed during the hospital encounter of 08/01/18  CBC  Result Value Ref Range   WBC 6.5 4.0 - 10.5 K/uL   RBC 3.63 (L) 4.22 - 5.81 MIL/uL   Hemoglobin 10.9 (L) 13.0 - 17.0 g/dL   HCT 33.1 (L) 39.0 - 52.0 %   MCV 91.2 80.0 - 100.0 fL   MCH 30.0 26.0 - 34.0 pg   MCHC 32.9 30.0 - 36.0 g/dL   RDW 14.6 11.5 - 15.5 %   Platelets 145 (L) 150 - 400 K/uL   nRBC 0.0 0.0 - 0.2 %  Comprehensive metabolic panel  Result Value Ref Range   Sodium 132 (L) 135 - 145 mmol/L   Potassium 3.1 (L) 3.5 - 5.1 mmol/L   Chloride 99 98 - 111 mmol/L   CO2 22 22 - 32 mmol/L   Glucose, Bld 159 (H) 70 - 99 mg/dL   BUN 28 (H) 8 - 23 mg/dL   Creatinine, Ser 1.58 (H) 0.61 - 1.24 mg/dL   Calcium 8.8 (L) 8.9 - 10.3 mg/dL   Total Protein 6.5 6.5 - 8.1 g/dL   Albumin 3.0 (L) 3.5 - 5.0 g/dL   AST 20 15 - 41 U/L   ALT 18 0 - 44 U/L   Alkaline Phosphatase 102 38 - 126 U/L   Total Bilirubin 1.1 0.3 - 1.2 mg/dL   GFR calc non Af Amer 38 (L) >60 mL/min   GFR calc Af Amer 44 (L) >60 mL/min   Anion gap 11 5 - 15  Urinalysis, Routine w reflex microscopic  Result Value Ref Range   Color, Urine YELLOW YELLOW   APPearance CLEAR CLEAR   Specific Gravity, Urine 1.009 1.005 - 1.030   pH 5.0 5.0 - 8.0   Glucose, UA NEGATIVE NEGATIVE mg/dL   Hgb urine  dipstick SMALL (A) NEGATIVE   Bilirubin Urine NEGATIVE NEGATIVE   Ketones, ur NEGATIVE NEGATIVE mg/dL   Protein, ur NEGATIVE NEGATIVE mg/dL   Nitrite NEGATIVE NEGATIVE   Leukocytes, UA NEGATIVE NEGATIVE   RBC / HPF 0-5 0 - 5 RBC/hpf   WBC, UA 0-5 0 - 5 WBC/hpf   Bacteria, UA NONE SEEN NONE SEEN   Squamous Epithelial / LPF 0-5 0 - 5   Hyaline Casts, UA PRESENT   I-stat troponin, ED  Result Value Ref Range   Troponin i, poc 0.03 0.00 - 0.08 ng/mL   Comment 3            EKG EKG Interpretation  Date/Time:  Wednesday August 01 2018 15:04:17 EST Ventricular Rate:  66 PR Interval:    QRS Duration: 181 QT Interval:  555 QTC Calculation: 555 R Axis:   99 Text Interpretation:  Electronic ventricular pacemaker Baseline wander Nonspecific T wave abnormality Confirmed by Lajean Saver [phone]) on 08/01/2018 3:10:03 PM   Radiology Ct Head Wo Contrast  Result Date: 08/01/2018 CLINICAL DATA:  Recent fall EXAM: CT HEAD WITHOUT CONTRAST TECHNIQUE: Contiguous axial images were obtained from the base of the skull through the vertex without intravenous contrast. COMPARISON:  08/25/2017 FINDINGS: Brain: Chronic atrophic changes are noted. There are findings consistent with prior left posterior parietal infarct stable from the previous exam. Chronic white matter ischemic change is seen. No findings to suggest acute hemorrhage, acute infarction or space-occupying mass lesion are noted. Prior lacunar infarct within the right thalamus is seen. Vascular: No hyperdense vessel or unexpected calcification. Skull: Normal. Negative for fracture or focal lesion. Sinuses/Orbits:  No acute finding. Other: None. IMPRESSION: Chronic atrophic and ischemic changes are noted. No acute abnormality seen. Electronically Signed   By: Inez Catalina M.D.   On: 08/01/2018 16:04    Procedures Procedures (including critical care time)  Medications Ordered in ED Medications  sodium chloride 0.9 % bolus 1,000 mL (has no  administration in time range)     Initial Impression / Assessment and Plan / ED Course  I have reviewed the triage vital signs and the nursing notes.  Pertinent labs & imaging results that were available during my care of the patient were reviewed by me and considered in my medical decision making (see chart for details).  Iv ns. Labs.   Reviewed nursing notes and prior charts for additional history.   Ns bolus.   Ct reviewed - neg acute.   Labs reviewed - no uti. k sl low. kcl po.  Po fluids/food.   Ambulate in hall w staff.  Recheck pt, denies pain, no faintness or dizziness. Afebrile.   Pt currently appears stable for d/c.   Rec close pcp f/u.  Return precautions provided.     Final Clinical Impressions(s) / ED Diagnoses   Final diagnoses:  None    ED Discharge Orders    None       Lajean Saver, MD 08/01/18 1911

## 2018-08-01 NOTE — ED Notes (Signed)
ED Provider at bedside. 

## 2018-08-01 NOTE — ED Notes (Signed)
Pt expresseed concern about not taking daily medications today. EDP advised pt too take his nightly medication with food this evening.

## 2018-08-01 NOTE — Discharge Instructions (Addendum)
It was our pleasure to provide your ER care today - we hope that you feel better.  Rest. Drink plenty of fluids. Make sure to eat meals regularly - consider supplementing nutrition with Ensure, Booster, or other nutritious shake 2-3x/day.  Follow up with primary care doctor in the next few days for recheck. From today's lab tests, your potassium level is mildly low (3.1) -  eat plenty of fruits and vegetables, take potassium supplement as prescribed, and follow up with your doctor.   Return to ER if worse, new symptoms, fevers, new or severe pain, trouble breathing, weak/fainting, other concern.

## 2018-08-03 ENCOUNTER — Emergency Department (HOSPITAL_COMMUNITY)
Admission: EM | Admit: 2018-08-03 | Discharge: 2018-08-03 | Disposition: A | Discharge status: Home / Self Care | Payer: Medicare Other | Attending: Emergency Medicine | Admitting: Emergency Medicine

## 2018-08-03 ENCOUNTER — Other Ambulatory Visit: Payer: Self-pay

## 2018-08-03 DIAGNOSIS — Y829 Unspecified medical devices associated with adverse incidents: Secondary | ICD-10-CM | POA: Diagnosis not present

## 2018-08-03 DIAGNOSIS — Z79899 Other long term (current) drug therapy: Secondary | ICD-10-CM | POA: Insufficient documentation

## 2018-08-03 DIAGNOSIS — Z7982 Long term (current) use of aspirin: Secondary | ICD-10-CM | POA: Insufficient documentation

## 2018-08-03 DIAGNOSIS — E1122 Type 2 diabetes mellitus with diabetic chronic kidney disease: Secondary | ICD-10-CM | POA: Diagnosis not present

## 2018-08-03 DIAGNOSIS — I255 Ischemic cardiomyopathy: Secondary | ICD-10-CM | POA: Diagnosis not present

## 2018-08-03 DIAGNOSIS — I2510 Atherosclerotic heart disease of native coronary artery without angina pectoris: Secondary | ICD-10-CM | POA: Diagnosis not present

## 2018-08-03 DIAGNOSIS — I130 Hypertensive heart and chronic kidney disease with heart failure and stage 1 through stage 4 chronic kidney disease, or unspecified chronic kidney disease: Secondary | ICD-10-CM | POA: Insufficient documentation

## 2018-08-03 DIAGNOSIS — T8189XA Other complications of procedures, not elsewhere classified, initial encounter: Secondary | ICD-10-CM | POA: Insufficient documentation

## 2018-08-03 DIAGNOSIS — N183 Chronic kidney disease, stage 3 (moderate): Secondary | ICD-10-CM | POA: Diagnosis not present

## 2018-08-03 DIAGNOSIS — Z95810 Presence of automatic (implantable) cardiac defibrillator: Secondary | ICD-10-CM | POA: Insufficient documentation

## 2018-08-03 DIAGNOSIS — E785 Hyperlipidemia, unspecified: Secondary | ICD-10-CM | POA: Insufficient documentation

## 2018-08-03 DIAGNOSIS — I4891 Unspecified atrial fibrillation: Secondary | ICD-10-CM | POA: Insufficient documentation

## 2018-08-03 DIAGNOSIS — I5022 Chronic systolic (congestive) heart failure: Secondary | ICD-10-CM | POA: Diagnosis not present

## 2018-08-03 DIAGNOSIS — L24A9 Irritant contact dermatitis due friction or contact with other specified body fluids: Secondary | ICD-10-CM

## 2018-08-03 DIAGNOSIS — T148XXA Other injury of unspecified body region, initial encounter: Secondary | ICD-10-CM

## 2018-08-03 LAB — BASIC METABOLIC PANEL
Anion gap: 8 (ref 5–15)
BUN: 14 mg/dL (ref 8–23)
CALCIUM: 8.8 mg/dL — AB (ref 8.9–10.3)
CO2: 22 mmol/L (ref 22–32)
CREATININE: 1.44 mg/dL — AB (ref 0.61–1.24)
Chloride: 105 mmol/L (ref 98–111)
GFR calc Af Amer: 50 mL/min — ABNORMAL LOW (ref >60)
GFR, EST NON AFRICAN AMERICAN: 43 mL/min — AB (ref >60)
GLUCOSE: 188 mg/dL — AB (ref 70–99)
Potassium: 3.6 mmol/L (ref 3.5–5.1)
Sodium: 135 mmol/L (ref 135–145)

## 2018-08-03 LAB — CBC WITH DIFFERENTIAL/PLATELET
Abs Immature Granulocytes: 0.04 10*3/uL (ref 0.00–0.07)
BASOS PCT: 0 %
Basophils Absolute: 0 10*3/uL (ref 0.0–0.1)
EOS ABS: 0.2 10*3/uL (ref 0.0–0.5)
EOS PCT: 3 %
HCT: 31.3 % — ABNORMAL LOW (ref 39.0–52.0)
Hemoglobin: 10.4 g/dL — ABNORMAL LOW (ref 13.0–17.0)
Immature Granulocytes: 1 %
Lymphocytes Relative: 22 %
Lymphs Abs: 1.3 10*3/uL (ref 0.7–4.0)
MCH: 30.5 pg (ref 26.0–34.0)
MCHC: 33.2 g/dL (ref 30.0–36.0)
MCV: 91.8 fL (ref 80.0–100.0)
MONO ABS: 0.6 10*3/uL (ref 0.1–1.0)
MONOS PCT: 11 %
Neutro Abs: 3.7 10*3/uL (ref 1.7–7.7)
Neutrophils Relative %: 63 %
PLATELETS: 151 10*3/uL (ref 150–400)
RBC: 3.41 MIL/uL — ABNORMAL LOW (ref 4.22–5.81)
RDW: 14.9 % (ref 11.5–15.5)
WBC: 5.8 10*3/uL (ref 4.0–10.5)
nRBC: 0.3 % — ABNORMAL HIGH (ref 0.0–0.2)

## 2018-08-03 NOTE — Discharge Instructions (Signed)
It is important that you use wet to dry dressings to help the wound heal.  You need to call Dr. Orest Dikes office on Monday, December 2, to set up an appointment this upcoming week.  If you develop fevers, abdominal pain, or any other new/concerning symptoms and return to the ER for evaluation.

## 2018-08-03 NOTE — ED Notes (Signed)
Dressing changed to wet to dry pt instructed how to do this at home and states he can perform at home

## 2018-08-03 NOTE — ED Provider Notes (Signed)
[facility]ERG[facility] DEPARTMENT Provider Note   CSN: [id number] Arrival date & time: 08/03/18  1512     History   Chief Complaint Chief Complaint  Patient presents with  . Wound Infection    HPI CASHIS RILL is a 84 y.o. male.  HPI  84 year old male with multiple significant comorbidities presents from home with concern for his abdominal wound.  He states he is feeling fine and denies any abdominal pain.  Wound care nurse checked on him today and due to some drainage, sent the patient here for evaluation.  The patient states that the wound care nurses check on him every couple days and have not previously noted any issues.  He denies fevers or vomiting or abdominal pain.  Past Medical History:  Diagnosis Date  . AICD (automatic cardioverter/defibrillator) present   . Arthritis    "shoulders" (10/31/2014)  . CAD (coronary artery disease)    s/p CABG; s/p Pacemaker  . Diverticulosis of colon   . GERD (gastroesophageal reflux disease)   . Gout   . Hyperlipidemia   . Hypertension   . Ischemic cardiomyopathy    s/p ICD  . On home oxygen therapy    "2L prn" (10/31/2014)  . Paroxysmal ventricular tachycardia ([place])   . SBO (small bowel obstruction) ([place]) 10/31/2014  . Shortness of breath dyspnea    with exertion  . Systolic CHF with reduced left ventricular function, NYHA class 2 [facility])     Patient Active Problem List   Diagnosis Date Noted  . Hypertensive heart disease with systolic heart failure and stage 3 chronic kidney disease ([place]) 07/08/2018  . Dyslipidemia associated with type 2 diabetes mellitus ([place]) 07/08/2018  . BPH without obstruction/lower urinary tract symptoms 07/08/2018  . Depression with anxiety 07/08/2018  . Physical deconditioning 07/08/2018  . Chronic gout due to renal impairment 07/08/2018  . Perforated small intestine s/p SB resection 06/26/2018 06/30/2018  . CKD (chronic kidney disease) stage 3, GFR 30-59 ml/min (HCC) 06/30/2018   . Acute esophagitis 06/30/2018  . Gastritis 06/30/2018  . Hematemesis 06/29/2018  . Thrombocytopenia ([place]) 06/27/2018  . Ventral incisional hernia 06/25/2018  . Small bowel ischemia ([place]) 06/25/2018  . Orthostatic hypotension 06/29/2015  . C1 cervical fracture ([place]) 06/24/2015  . Anemia of chronic disease 06/24/2015  . A-fib ([place]) 06/24/2015  . Hypertension   . GERD (gastroesophageal reflux disease)   . Protein-calorie malnutrition, severe ([place]) 11/02/2014  . Partial small bowel obstruction ([place]) 10/31/2014  . DM type 2 causing vascular disease ([place]) 10/31/2014  . Do not resuscitate 10/31/2014  . Preoperative cardiovascular examination 10/31/2014  . Ischemic cardiomyopathy   . Atrial flutter ([place]) 09/02/2014  . Chronic systolic heart failure ([place]) 07/23/2009  . ICD (implantable cardioverter-defibrillator) in place 07/23/2009  . Coronary atherosclerosis 07/17/2009  . CORONARY ARTERY BYPASS GRAFT, HX OF 07/17/2009    Past Surgical History:  Procedure Laterality Date  . BI-VENTRICULAR IMPLANTABLE CARDIOVERTER DEFIBRILLATOR UPGRADE N/A 01/22/2014   Procedure: BI-VENTRICULAR IMPLANTABLE CARDIOVERTER DEFIBRILLATOR UPGRADE;  Surgeon: Evans Lance, MD;  Location: [facility] CATH LAB;  Service: Cardiovascular;  Laterality: N/A;  . BOWEL RESECTION    . CARDIAC CATHETERIZATION  03/2011   Archie Endo 01/18/2011  . CARDIAC DEFIBRILLATOR PLACEMENT  07/2003   Archie Endo 01/18/2011  . CATARACT EXTRACTION W/ INTRAOCULAR LENS  IMPLANT, BILATERAL Bilateral   . CATARACT EXTRACTION W/PHACO Right 12/17/2014   Procedure: PHACOEMULSIFICATION CATARACT EXTRACTION WITH IOL IMPLANT RIGHT EYE;  Surgeon: Marylynn Pearson, MD;  Location: [facility];  Service:  Ophthalmology;  Laterality: Right;  . CHOLECYSTECTOMY    . CORONARY ANGIOPLASTY WITH STENT PLACEMENT  04/2011   2 stents/notes 05/03/2011  . CORONARY ARTERY BYPASS GRAFT  03/2003   CABG X3/notes 01/18/2011  . ESOPHAGOGASTRODUODENOSCOPY (EGD) WITH PROPOFOL N/A 06/29/2018    Procedure: ESOPHAGOGASTRODUODENOSCOPY (EGD) WITH PROPOFOL;  Surgeon: Wilford Corner, MD;  Location: [facility] ENDOSCOPY;  Service: Endoscopy;  Laterality: N/A;  . EYE SURGERY Bilateral    [place]  . IMPLANTABLE CARDIOVERTER DEFIBRILLATOR (ICD) GENERATOR CHANGE Left 10/12/2011   Procedure: ICD GENERATOR CHANGE;  Surgeon: Evans Lance, MD;  Location: [facility] CATH LAB;  Service: Cardiovascular;  Laterality: Left;  . LAPAROTOMY N/A 06/26/2018   Procedure: EXPLORATORY LAPAROTOMY SMALL BOWEL RESECTION X 2;  Surgeon: Ileana Roup, MD;  Location: [facility] ORS;  Service: General;  Laterality: N/A;  . PACEMAKER PLACEMENT    . PACEMAKER REVISION N/A 10/12/2011   Procedure: PACEMAKER REVISION;  Surgeon: Evans Lance, MD;  Location: [facility] CATH LAB;  Service: Cardiovascular;  Laterality: N/A;  . TEE WITH CARDIOVERSION  03/2003   Archie Endo 01/18/2011  . TONSILLECTOMY          Home Medications    Prior to Admission medications   Medication Sig Start Date End Date Taking? Authorizing Provider  acetaminophen (TYLENOL) 325 MG tablet Take 2 tablets (650 mg total) by mouth every 6 (six) hours as needed for mild pain or fever. 07/04/18  Yes Meuth, Brooke A, PA-C  acidophilus (RISAQUAD) CAPS capsule Take 1 capsule by mouth daily.   Yes [provider]  allopurinol (ZYLOPRIM) 300 MG tablet Take 300 mg by mouth daily.     Yes [provider]  aspirin (RA ASPIRIN ADULT LOW STRENGTH) 81 MG EC tablet Take 1 tablet (81 mg total) by mouth daily. 07/13/18  Yes Meuth, Brooke A, PA-C  atorvastatin (LIPITOR) 40 MG tablet Take 1 tablet (40 mg total) by mouth daily at 6 PM. 03/06/18  Yes Larey Dresser, MD  carvedilol (COREG) 6.25 MG tablet Take 1.5 tablets (9.375 mg total) by mouth 2 (two) times daily with a meal. 03/27/18  Yes Larey Dresser, MD  escitalopram (LEXAPRO) 10 MG tablet Take 1 tablet (10 mg total) by mouth daily. 06/28/15  Yes Ghimire, Henreitta Leber, MD  famotidine (PEPCID) 10 MG tablet Take 10 mg by mouth 2  (two) times daily.   Yes [provider]  finasteride (PROSCAR) 5 MG tablet Take 5 mg by mouth every evening. 07/04/18  Yes [provider]  ketotifen (ZADITOR) 0.025 % ophthalmic solution Place 1 drop into both eyes daily as needed (dry eyes).    Yes [provider]  LORazepam (ATIVAN) 0.5 MG tablet Take 1 tablet (0.5 mg total) by mouth 2 (two) times daily as needed for anxiety. 07/04/18  Yes Gerlene Fee, NP  NITROSTAT 0.4 MG SL tablet Place 0.4 mg under the tongue every 5 (five) minutes as needed for chest pain (MAX 3 TABLETS).  12/05/12  Yes [provider]  pantoprazole (PROTONIX) 40 MG tablet Take 1 tablet (40 mg total) by mouth 2 (two) times daily. 07/04/18  Yes Meuth, Brooke A, PA-C  PATADAY 0.2 % SOLN Place 1 drop into both eyes daily.  01/14/14  Yes [provider]  potassium chloride SA (K-DUR,KLOR-CON) 20 MEQ tablet Take 1 tablet (20 mEq total) by mouth daily. 08/01/18  Yes Lajean Saver, MD  saccharomyces boulardii (FLORASTOR) 250 MG capsule Take 1 capsule (250 mg total) by mouth 2 (two) times daily.  07/04/18  Yes Meuth, Blaine Hamper, PA-C  spironolactone (ALDACTONE) 25 MG tablet Take 0.5 tablets (12.5 mg total) by mouth daily. 05/29/18  Yes Larey Dresser, MD  torsemide (DEMADEX) 20 MG tablet Take 2 tablets (40 mg total) every other day alternating with 1 tablet (20 mg total) every other day. 05/21/18  Yes Larey Dresser, MD  chlorhexidine (PERIDEX) 0.12 % solution 15 mLs by Mouth Rinse route 2 (two) times daily. Patient not taking: Reported on 08/01/2018 07/04/18   Margie Billet A, PA-C  oxyCODONE (OXY IR/ROXICODONE) 5 MG immediate release tablet Take 1 tablet (5 mg total) by mouth every 6 (six) hours as needed for severe pain. Patient not taking: Reported on 08/01/2018 07/04/18   Gerlene Fee, NP    Family History Family History  Problem Relation Age of Onset  . Heart Problems Mother   . Diabetes Mother   . Heart Problems Brother       Social History Social History   Tobacco Use  . Smoking status: Never Smoker  . Smokeless tobacco: Former Systems developer    Types: Chew  . Tobacco comment: no chew in over 3 years  Substance Use Topics  . Alcohol use: Yes    Comment: "might take a drink during the holidays"  . Drug use: No     Allergies   Patient has no known allergies.   Review of Systems Review of Systems  Constitutional: Negative for fever.  Gastrointestinal: Negative for abdominal pain and vomiting.  Skin: Positive for wound.  All other systems reviewed and are negative.    Physical Exam Updated Vital Signs BP (!) 118/54   Pulse 62   Temp 97.6 F (36.4 C) (Oral)   Resp 18   Ht 5\' 4"  (1.626 m)   Wt 69.9 kg   SpO2 99%   BMI 26.43 kg/m   Physical Exam  Constitutional: He appears well-developed and well-nourished. No distress.  HENT:  Head: Normocephalic and atraumatic.  Right Ear: External ear normal.  Left Ear: External ear normal.  Nose: Nose normal.  Eyes: Right eye exhibits no discharge. Left eye exhibits no discharge.  Neck: Neck supple.  Pulmonary/Chest: Effort normal.  Abdominal: Soft. There is no tenderness.    See picture. Firm mass just to the right of the abdominal wound at the inferior portion. Purulent drainage noted  Musculoskeletal: He exhibits no edema.  Neurological: He is alert.  Skin: Skin is warm and dry. He is not diaphoretic.  Psychiatric: His mood appears not anxious.  Nursing note and vitals reviewed.      ED Treatments / Results  Labs (all labs ordered are listed, but only abnormal results are displayed) Labs Reviewed  BASIC METABOLIC PANEL - Abnormal; Notable for the following components:      Result Value   Glucose, Bld 188 (*)    Creatinine, Ser 1.44 (*)    Calcium 8.8 (*)    GFR calc non Af Amer 43 (*)    GFR calc Af Amer 50 (*)    All other components within normal limits  CBC WITH DIFFERENTIAL/PLATELET - Abnormal; Notable for the following  components:   RBC 3.41 (*)    Hemoglobin 10.4 (*)    HCT 31.3 (*)    nRBC 0.3 (*)    All other components within normal limits    EKG None  Radiology No results found.  Procedures Procedures (including critical care time)  Medications Ordered in ED Medications - No data to display  Initial Impression / Assessment and Plan / ED Course  I have reviewed the triage vital signs and the nursing notes.  Pertinent labs & imaging results that were available during my care of the patient were reviewed by me and considered in my medical decision making (see chart for details).     Patient's lab work is reassuring, especially when compared to baseline.  Vital signs are reassuring.  WBC benign.  Patient case, picture of abdomen wound were discussed with Dr. Dema Severin, patient's surgeon.  He has seen the patient earlier this week and states this looks very similar.  The patient has had poor wound healing but he doubts this is infected.  I think this is likely true.  We discussed the importance of wet-to-dry dressings as per Dr. Dema Severin.  He asked for the patient to call the office on Monday for an appointment next week for another wound check.  However antibiotics are not needed at this time.  We discussed return precautions.  Final Clinical Impressions(s) / ED Diagnoses   Final diagnoses:  Wound drainage    ED Discharge Orders    None       Sherwood Gambler, MD 08/03/18 1654

## 2018-08-03 NOTE — ED Triage Notes (Signed)
Per EMS pt from home.  Had recent abdominal surgery and comes to [place] today due to the lower part of the incision appears to be draining.  NAD noted a this time AOx4

## 2018-08-03 NOTE — ED Notes (Signed)
Patient given discharge instructions and verbalized understanding.  Patient stable to discharge at this time.  Patient is alert and oriented to baseline.  No distressed noted at this time.  All belongings taken with the patient at discharge.   

## 2018-08-09 ENCOUNTER — Ambulatory Visit (INDEPENDENT_AMBULATORY_CARE_PROVIDER_SITE_OTHER): Payer: Medicare Other

## 2018-08-09 ENCOUNTER — Telehealth: Payer: Self-pay

## 2018-08-09 DIAGNOSIS — I5022 Chronic systolic (congestive) heart failure: Secondary | ICD-10-CM | POA: Diagnosis not present

## 2018-08-09 DIAGNOSIS — Z95810 Presence of automatic (implantable) cardiac defibrillator: Secondary | ICD-10-CM

## 2018-08-09 DIAGNOSIS — I255 Ischemic cardiomyopathy: Secondary | ICD-10-CM | POA: Diagnosis not present

## 2018-08-09 NOTE — Progress Notes (Signed)
Remote ICD transmission.   

## 2018-08-09 NOTE — Telephone Encounter (Signed)
Spoke with [name] regarding ATP x 2  episode from 10/24 ( pt was hospitalized w/ small bowel ischemia) she stated that pt does not drive that she drives pt to his apts.

## 2018-08-09 NOTE — Progress Notes (Signed)
EPIC Encounter for ICM Monitoring  Patient Name: [name] is a 84 y.o. male Date: 08/09/2018 Primary Care Physican: [name], MD Primary Cardiologist:Harwani/McLean  Electrophysiologist: [name] Last Weight:unkonwn Bi-V Pacing: 91.9%                                                                   Attempted call to friend [name] and unable to reach.  Left detailed message, per DPR, regarding transmission.  Transmission reviewed.  Patient has been in hospital twice in last week and 1 time in October followed by inpatient rehab.   Thoracic impedance normal.   Prescribed: Torsemide20 mg Take 2 tablets (40 mg total) every other day alternating with 1 tablet (20 mg total) every other day.  Labs: 08/03/2018 Creatinine 1.44, BUN 14, Potassium 3.6, Sodium 135, eGFR 43-50 08/01/2018 Creatinine 1.58, BUN 28, Potassium 3.1, Sodium 132, eGFR 38-44  07/03/2018 Creatinine 1.62, BUN 26, Potassium 4.0, Sodium 139, eGFR 36-42  07/02/2018 Creatinine 1.70, BUN 34, Potassium 2.9, Sodium 143, eGFR 34-39  07/01/2018 Creatinine 2.12, BUN 43, Potassium 3.8, Sodium 144, eGFR 26-30  06/30/2018 Creatinine 2.32, BUN 46, Potassium 3.7, Sodium 141, eGFR 23-27  06/29/2018 Creatinine 2.18, BUN 48, Potassium 3.6, Sodium 142, eGFR 25-29  06/28/2018 Creatinine 2.22, BUN 47, Potassium 3.7, Sodium 140, eGFR 25-29  06/27/2018 Creatinine 2.11, BUN 52, Potassium 4.0, Sodium 136, eGFR 26-30  06/26/2018 Creatinine 2.10, BUN 65, Potassium 4.4, Sodium 134, eGFR 26-30        06/25/2018 Creatinine 2.23, BUN 66, Potassium 4.3, Sodium 133, eGFR 24-28  06/12/2018 Creatinine 1.92, BUN 48, Potassium 4.0, Sodium 134, eGFR 29-34 A complete set of results can be found in Results Review.  Recommendations: Left voice mail with ICM number and encouraged to call if experiencing any fluid symptoms.  Follow-up plan: ICM clinic phone appointment on 08/23/2018.   Office appointment scheduled 08/21/2018 with Dr.  Aundra Dubin.    Copy of ICM check sent to Dr. [name].   3 month ICM trend: 08/09/2018    1 Year ICM trend:       [name], RN 08/09/2018 3:55 PM

## 2018-08-14 ENCOUNTER — Encounter: Payer: Self-pay | Admitting: Cardiology

## 2018-08-21 ENCOUNTER — Ambulatory Visit (HOSPITAL_COMMUNITY)
Admission: RE | Admit: 2018-08-21 | Discharge: 2018-08-21 | Disposition: A | Discharge status: Home / Self Care | Payer: Medicare Other | Source: Ambulatory Visit | Attending: Cardiology | Admitting: Cardiology

## 2018-08-21 ENCOUNTER — Telehealth (HOSPITAL_COMMUNITY): Payer: Self-pay

## 2018-08-21 VITALS — BP 110/62 | HR 60 | Wt 149.6 lb

## 2018-08-21 DIAGNOSIS — I5022 Chronic systolic (congestive) heart failure: Secondary | ICD-10-CM | POA: Insufficient documentation

## 2018-08-21 DIAGNOSIS — I472 Ventricular tachycardia, unspecified: Secondary | ICD-10-CM

## 2018-08-21 DIAGNOSIS — Z951 Presence of aortocoronary bypass graft: Secondary | ICD-10-CM | POA: Diagnosis not present

## 2018-08-21 DIAGNOSIS — N183 Chronic kidney disease, stage 3 unspecified: Secondary | ICD-10-CM

## 2018-08-21 LAB — BASIC METABOLIC PANEL
ANION GAP: 11 (ref 5–15)
BUN: 37 mg/dL — ABNORMAL HIGH (ref 8–23)
CHLORIDE: 102 mmol/L (ref 98–111)
CO2: 19 mmol/L — ABNORMAL LOW (ref 22–32)
Calcium: 9.1 mg/dL (ref 8.9–10.3)
Creatinine, Ser: 1.97 mg/dL — ABNORMAL HIGH (ref 0.61–1.24)
GFR, EST AFRICAN AMERICAN: 34 mL/min — AB (ref >60)
GFR, EST NON AFRICAN AMERICAN: 29 mL/min — AB (ref >60)
Glucose, Bld: 204 mg/dL — ABNORMAL HIGH (ref 70–99)
POTASSIUM: 5.7 mmol/L — AB (ref 3.5–5.1)
SODIUM: 132 mmol/L — AB (ref 135–145)

## 2018-08-21 MED ORDER — SPIRONOLACTONE 25 MG PO TABS: 25.0000 mg | ORAL_TABLET | Freq: Every day | ORAL | 6 refills | Status: DC

## 2018-08-21 MED ORDER — TORSEMIDE 20 MG PO TABS: 20.0000 mg | ORAL_TABLET | ORAL | 6 refills | Status: DC

## 2018-08-21 NOTE — Telephone Encounter (Signed)
-----   Message from Larey Dresser, MD sent at 08/21/2018  4:11 PM EST ----- Stop spironolactone.  Decrease torsemide to 20 mg every other day. BMET on Friday.  Stop any K supplement.  Low K diet.

## 2018-08-21 NOTE — Telephone Encounter (Signed)
Spoke with patients friend Verdia Kuba who was present for patients appointment today. Per MD recommendations, pt to stop Spironolactone, Decrease Torsemide to 20mg  every other day and stop potassium.  Low potassium diet discussed. She is aware of changes and recommendations. She was able to repeat back, stating understanding.  Changes sent to pharmacy. Lab appointment made for Friday 230p.

## 2018-08-21 NOTE — Patient Instructions (Addendum)
INCREASE Spironolactone to 25mg  (1 tab) daily  Labs today We will only contact you if something comes back abnormal or we need to make some changes. Otherwise no news is good news!  Your physician request that you have labs repeated in 10 days.  Your physician recommends that you schedule a follow-up appointment in: 6 weeks with NP/PA.

## 2018-08-22 NOTE — Progress Notes (Signed)
Advanced Heart Failure Clinic Note   PCP: Dr. Ardeth Perfect HF Cardiology: Dr. Aundra Dubin  84 y.o. with CAD s/p CABG and chronic systolic CHF from ischemic cardiomyopathy presents for HF clinic evaluation.  He has a Medtronic CRT-D device.  Echo in 11/15 showed EF 20-25%. He had VT x 2 in 9/17 terminated by ATP.   Admitted from clinic 06/10/16 with orthostasis and 24 lb weight loss after switch from Lasix to torsemide.  Optivol with fluid index well below threshold and impedance continuing up.  With orthostasis, light headedness, and living alone pt was admitted for dehydration. AKI noted on labwork. Diuretics held on admission and given IV fluid. Entresto stopped. Dizziness/orthostasis improved.  Torsemide resumed 06/13/16, then cut back further on 10/10 and again on 06/15/16 for uptrend in BUN.  Repeat Echo (10/17) showed EF 35-40%, mildly dilated RV with mild to moderately decreased systolic function, severe TR, PASP 48 mmHg.   Echo in 1/19 showed  EF 35% with wall motion abnormalities, mildly decreased RV systolic function, moderate TR.   He was admitted in 10/19 with ventral incisional hernia with incarcerated small bowel with ischemia.  He had resection of small bowel in the OR.   He returns for followup of CHF.  Weight is down 6 lbs.  He is taking torsemide 40 mg every other day. He is home now from a rehab stay after his surgery, still weak and getting PT.  He walks with a cane, he is short of breath after walking 100 feet.  No orthopnea/PND. No chest pain.    Labs today showed K up to 5.7 and creatinine up to 1.97.   Labs (9/17): creatinine 1.13, K 3.4, hgb 9.4, BNP 2230 => 2270 Labs (10/17): K 4.1, creatinine 1.56  Labs (11/17): K 3.9, creatinine 1.36, BNP 1183 Labs (3/18): K 4.3, creatinine 1.95 Labs (12/18): K 4.1, creatinine 2.16 Labs (1/19): LDL 66, HDL 47 Labs (4/19): K 4.1, creatinine 1.75 Labs (6/19): K 4.5, creatinine 2.32 Labs (7/19): K 4.7, creatinine 1.9 Labs (11/19): K 3.6,  creatinine 1.44 Labs (12/19): K 5.7, creatinine 1.97  PMH: 1. Atrial flutter: Paroxysmal. 2. PVCs, h/o VT.  3. Chronic systolic CHF: Ischemic cardiomyopathy.  Medtronic CRT-D.   - Echo (11/15): EF 20-25%, mildly dilated LV.   - Echo (10/17) showed EF 35-40%, mildly dilated RV with mild to moderately decreased systolic function, severe TR, PASP 48 mmHg. - Echo (1/19) with EF 35%, regional WMAs, mildly decreased RV systolic function, moderate TR.  4. H/o SBO 5. HTN 6. Gout  7. Hyperlipidemia 8. GERD 9. CAD: s/p CABG in 5/12 with LIMA-LAD, SVG-ramus, SVG-D1, SVG-PDA. - PCI to OM1 and proximal LCx in 8/12.  10. CKD: Stage 3.  11. BPH 12. Ventral hernia with incarcerated small bowel, s/p resection of small bowel in OR in 10/19.   SH: Nonsmoker, lives in apartment, no ETOH.    Family History  Problem Relation Age of Onset  . Heart Problems Mother   . Diabetes Mother   . Heart Problems Brother    ROS: All systems reviewed and negative except as per HPI.   Current Outpatient Medications  Medication Sig Dispense Refill  . acetaminophen (TYLENOL) 325 MG tablet Take 2 tablets (650 mg total) by mouth every 6 (six) hours as needed for mild pain or fever.    Marland Kitchen allopurinol (ZYLOPRIM) 300 MG tablet Take 300 mg by mouth daily.      Marland Kitchen aspirin (RA ASPIRIN ADULT LOW STRENGTH) 81 MG EC tablet Take  1 tablet (81 mg total) by mouth daily.  0  . atorvastatin (LIPITOR) 40 MG tablet Take 40 mg by mouth daily.    . carvedilol (COREG) 6.25 MG tablet Take 1.5 tablets (9.375 mg total) by mouth 2 (two) times daily with a meal. 90 tablet 11  . chlorhexidine (PERIDEX) 0.12 % solution 15 mLs by Mouth Rinse route 2 (two) times daily. 120 mL 0  . finasteride (PROSCAR) 5 MG tablet Take 5 mg by mouth every evening.    Marland Kitchen ketotifen (ZADITOR) 0.025 % ophthalmic solution Place 1 drop into both eyes daily as needed (dry eyes).     . Lactobacillus (ACIDOPHILUS PROBIOTIC PO) Take 1 tablet by mouth 2 (two) times daily.      Marland Kitchen LORazepam (ATIVAN) 0.5 MG tablet Take 1 tablet (0.5 mg total) by mouth 2 (two) times daily as needed for anxiety. (Patient taking differently: Take 0.5 mg by mouth 2 (two) times daily. ) 28 tablet 0  . oxyCODONE (OXY IR/ROXICODONE) 5 MG immediate release tablet Take 1 tablet (5 mg total) by mouth every 6 (six) hours as needed for severe pain. 20 tablet 0  . PATADAY 0.2 % SOLN Place 1 drop into both eyes daily.     Marland Kitchen saccharomyces boulardii (FLORASTOR) 250 MG capsule Take 1 capsule (250 mg total) by mouth 2 (two) times daily.    Marland Kitchen NITROSTAT 0.4 MG SL tablet Place 0.4 mg under the tongue every 5 (five) minutes as needed for chest pain (MAX 3 TABLETS).     . pantoprazole (PROTONIX) 40 MG tablet Take 1 tablet (40 mg total) by mouth 2 (two) times daily. (Patient not taking: Reported on 08/21/2018)    . torsemide (DEMADEX) 20 MG tablet Take 1 tablet (20 mg total) by mouth every other day. 15 tablet 6   No current facility-administered medications for this encounter.    BP 110/62   Pulse 60   Wt 67.9 kg (149 lb 9.6 oz)   SpO2 96%   BMI 25.68 kg/m    Wt Readings from Last 3 Encounters:  08/21/18 67.9 kg (149 lb 9.6 oz)  08/03/18 69.9 kg (154 lb)  07/05/18 72.2 kg (159 lb 1.6 oz)   General: NAD Neck: No JVD, no thyromegaly or thyroid nodule.  Lungs: Clear to auscultation bilaterally with normal respiratory effort. CV: Nondisplaced PMI.  Heart regular S1/S2, no S3/S4, no murmur.  No peripheral edema.  No carotid bruit.  Normal pedal pulses.  Abdomen: Soft, nontender, no hepatosplenomegaly, no distention.  Skin: Intact without lesions or rashes.  Neurologic: Alert and oriented x 3.  Psych: Normal affect. Extremities: No clubbing or cyanosis.  HEENT: Normal.   Assessment/Plan: 1. Chronic systolic CHF: Ischemic cardiomyopathy.  He has a Medtronic CRT-D device.  Echo in 1/19 showed EF 35% which is stable.  NYHA II symptoms but very weak after 10/19 surgery for incarcerated small bowel.   Euvolemic by exam.  Off lisinopril with elevated creatinine.  Creatinine and K higher today.  - Decrease torsemide to 20 mg every other day.   - He has failed Entresto with marked hypotension and is off lisinopril with rise in creatinine.  - Stop spironolactone with elevated K.  - Continue Coreg 9.375 mg bid.   - Repeat BMET 1 week with med changes above.  2. CAD: s/p CABG.  No chest pain.  - Continue statin and ASA 81 daily.   3. VT: ATP 9/17 was successful.  No VT recently.   4. Tricuspid regurgitation:  Moderate on 1/19 echo.   5. CKD III: Creatinine and K higher, cutting back on torsemide and stopping spironolactone as above.   Followup 6 wks NP/PA.    Loralie Champagne 08/22/2018

## 2018-08-23 ENCOUNTER — Ambulatory Visit (INDEPENDENT_AMBULATORY_CARE_PROVIDER_SITE_OTHER): Payer: Medicare Other

## 2018-08-23 DIAGNOSIS — I5022 Chronic systolic (congestive) heart failure: Secondary | ICD-10-CM

## 2018-08-23 DIAGNOSIS — Z95810 Presence of automatic (implantable) cardiac defibrillator: Secondary | ICD-10-CM

## 2018-08-24 ENCOUNTER — Ambulatory Visit (HOSPITAL_COMMUNITY)
Admission: RE | Admit: 2018-08-24 | Discharge: 2018-08-24 | Disposition: A | Discharge status: Home / Self Care | Payer: Medicare Other | Source: Ambulatory Visit | Attending: Internal Medicine | Admitting: Internal Medicine

## 2018-08-24 DIAGNOSIS — I5022 Chronic systolic (congestive) heart failure: Secondary | ICD-10-CM | POA: Insufficient documentation

## 2018-08-24 LAB — BASIC METABOLIC PANEL
Anion gap: 12 (ref 5–15)
BUN: 41 mg/dL — ABNORMAL HIGH (ref 8–23)
CALCIUM: 9.1 mg/dL (ref 8.9–10.3)
CO2: 20 mmol/L — ABNORMAL LOW (ref 22–32)
CREATININE: 2.05 mg/dL — AB (ref 0.61–1.24)
Chloride: 99 mmol/L (ref 98–111)
GFR calc non Af Amer: 28 mL/min — ABNORMAL LOW (ref >60)
GFR, EST AFRICAN AMERICAN: 32 mL/min — AB (ref >60)
Glucose, Bld: 165 mg/dL — ABNORMAL HIGH (ref 70–99)
Potassium: 4.3 mmol/L (ref 3.5–5.1)
SODIUM: 131 mmol/L — AB (ref 135–145)

## 2018-08-24 NOTE — Progress Notes (Signed)
EPIC Encounter for ICM Monitoring  Patient Name: [name] is a 84 y.o. male Date: 08/24/2018 Primary Care Physican: [name], MD Primary Cardiologist:Harwani/McLean  Electrophysiologist: [name] Last Weight:[place]wn Bi-V Pacing: 93.6%  Transmission reviewed.    Thoracic impedance normal.   Prescribed:Torsemide20 mgTake 1 tablet (20 mg total) every other day.  Labs: 08/03/2018 Creatinine 1.44, BUN 14, Potassium 3.6, Sodium 135, eGFR 43-50 08/01/2018 Creatinine 1.58, BUN 28, Potassium 3.1, Sodium 132, eGFR 38-44  07/03/2018 Creatinine 1.62, BUN 26, Potassium 4.0, Sodium 139, eGFR 36-42  07/02/2018 Creatinine 1.70, BUN 34, Potassium 2.9, Sodium 143, eGFR 34-39  07/01/2018 Creatinine 2.12, BUN 43, Potassium 3.8, Sodium 144, eGFR 26-30  06/30/2018 Creatinine 2.32, BUN 46, Potassium 3.7, Sodium 141, eGFR 23-27  06/29/2018 Creatinine 2.18, BUN 48, Potassium 3.6, Sodium 142, eGFR 25-29  06/28/2018 Creatinine 2.22, BUN 47, Potassium 3.7, Sodium 140, eGFR 25-29  06/27/2018 Creatinine 2.11, BUN 52, Potassium 4.0, Sodium 136, eGFR 26-30  06/26/2018 Creatinine 2.10, BUN 65, Potassium 4.4, Sodium 134, eGFR 26-30        06/25/2018 Creatinine 2.23, BUN 66, Potassium 4.3, Sodium 133, eGFR 24-28  06/12/2018 Creatinine 1.92, BUN 48, Potassium 4.0, Sodium 134, eGFR 29-34 A complete set of results can be found in Results Review.  Recommendations: None  Follow-up plan: ICM clinic phone appointment on1/02/2019. Office appointment scheduled 10/02/2018 with HF clinic NP/PA and 10/09/2018 with Dr [name].   Copy of ICM check sent to Dr.Taylor.   3 month ICM trend: 08/23/2018    1 Year ICM trend:       [name], RN 08/24/2018 3:11 PM

## 2018-08-31 ENCOUNTER — Ambulatory Visit (HOSPITAL_COMMUNITY)
Admission: RE | Admit: 2018-08-31 | Discharge: 2018-08-31 | Disposition: A | Discharge status: Home / Self Care | Payer: Medicare Other | Source: Ambulatory Visit | Attending: Internal Medicine | Admitting: Internal Medicine

## 2018-08-31 DIAGNOSIS — I5022 Chronic systolic (congestive) heart failure: Secondary | ICD-10-CM | POA: Diagnosis present

## 2018-08-31 LAB — BASIC METABOLIC PANEL
Anion gap: 11 (ref 5–15)
BUN: 31 mg/dL — ABNORMAL HIGH (ref 8–23)
CALCIUM: 8.8 mg/dL — AB (ref 8.9–10.3)
CO2: 23 mmol/L (ref 22–32)
CREATININE: 1.79 mg/dL — AB (ref 0.61–1.24)
Chloride: 98 mmol/L (ref 98–111)
GFR calc Af Amer: 38 mL/min — ABNORMAL LOW (ref >60)
GFR, EST NON AFRICAN AMERICAN: 33 mL/min — AB (ref >60)
GLUCOSE: 188 mg/dL — AB (ref 70–99)
Potassium: 3.8 mmol/L (ref 3.5–5.1)
Sodium: 132 mmol/L — ABNORMAL LOW (ref 135–145)

## 2018-09-10 ENCOUNTER — Ambulatory Visit (INDEPENDENT_AMBULATORY_CARE_PROVIDER_SITE_OTHER): Payer: Medicare Other

## 2018-09-10 ENCOUNTER — Telehealth: Payer: Self-pay

## 2018-09-10 DIAGNOSIS — I5022 Chronic systolic (congestive) heart failure: Secondary | ICD-10-CM | POA: Diagnosis not present

## 2018-09-10 DIAGNOSIS — Z95810 Presence of automatic (implantable) cardiac defibrillator: Secondary | ICD-10-CM

## 2018-09-10 NOTE — Progress Notes (Signed)
EPIC Encounter for ICM Monitoring  Patient Name: [name] is a 85 y.o. male Date: 09/10/2018 Primary Care Physican: [name], MD Primary Cardiologist:Harwani/McLean  Electrophysiologist: [name] LastWeight:[place] Bi-V Pacing: 94%  Spoke to [name], [place].  She reported patient is feeling fine and pt denies weight gain, leg swelling or shortness of breath.   Thoracic impedance abnormal suggesting fluid accumulation starting 08/28/2018.  Prescribed:Torsemide20 mgTake 1 tablet (20 mg total) every other day.   [name] reported patient is taking differently, he is taking 2 tablets (40 mg) every other day even though she was instructed to decrease Torsemide to 20 mg every other day on 08/22/2019 by Dr Claris Gladden office.   Labs: 08/31/2018 Creatinine 1.79, BUN 31, Potassium 3.8, Sodium 132, eGFR 33-38 08/24/2018 Creatinine 2.05, BUN 41, Potassium 4.3, Sodium 131, eGFR 28-32 08/21/2018 Creatinine 1.97, BUN 37, Potassium 5.7, Sodium 132, eGFR 29-34  08/03/2018 Creatinine1.44, BUN14, Potassium3.6, Sodium135, eGFR43-50 08/01/2018 Creatinine1.58, BUN28, Potassium3.1, Sodium132, [id number]  07/03/2018 Creatinine1.62, BUN26, Potassium4.0, [id number], [id number]  10/28/2019Creatinine 1.70, BUN34, Potassium2.9, [id number], [id number]  10/27/2019Creatinine 2.12, BUN43, Potassium3.8, Sodium144, eGFR26-30  10/26/2019Creatinine 2.32, BUN46, Potassium3.7, [id number], [id number]  10/25/2019Creatinine 2.18, BUN48, Potassium3.6, Sodium142, eGFR25-29  10/24/2019Creatinine 2.22, BUN47, Potassium3.7, Sodium140, eGFR25-29  10/23/2019Creatinine 2.11, BUN52, Potassium4.0, Sodium136, eGFR26-30  10/22/2019Creatinine 2.10, BUN65, Potassium4.4, Sodium134, eGFR26-30  10/21/2019Creatinine 2.23, BUN66, Potassium4.3, [id number], [id number] 06/12/2018 Creatinine 1.92, BUN 48,  Potassium 4.0, Sodium 134, eGFR 29-34 A complete set of results can be found in Results Review.  Recommendations: Copy of ICM check sent to Dr.Taylor and Dr Aundra Dubin for review and if any recommendations will call back.   Follow-up plan: ICM clinic phone appointment on1/20/2020 to recheck fluid levels. Office appointment scheduled 10/02/2018 with HF clinic NP/PA and 10/09/2018 with Dr [name].   3 month ICM trend: 09/10/2018    1 Year ICM trend:       [name], RN 09/10/2018 12:43 PM

## 2018-09-10 NOTE — Telephone Encounter (Signed)
Remote ICM transmission received.  Attempted call to [name], friend listed on DPR regarding ICM remote transmission and left detailed message, per DPR, to return call.

## 2018-09-14 ENCOUNTER — Other Ambulatory Visit (HOSPITAL_COMMUNITY): Payer: Self-pay | Admitting: Cardiology

## 2018-09-17 MED ORDER — TORSEMIDE 20 MG PO TABS: ORAL_TABLET | ORAL | Status: DC

## 2018-09-17 NOTE — Progress Notes (Signed)
He can take torsemide 40 daily alternating with 20 daily but will need BMET in 1 week.

## 2018-09-17 NOTE — Progress Notes (Signed)
I called and spoke with [name] (ok per DPR) and advised her of Dr. Claris Gladden recommendations to have the patient try increasing torsemide 20 mg- take 2 tablets (40 mg) once every other day alternating with 1 tablet (20 mg) once every other day.   [name] is aware that I will send a message to the CHF clinic that the patient will need a repeat BMP in 1 week. Per [name], she will also call the CHF clinic to schedule this.

## 2018-09-21 ENCOUNTER — Encounter: Payer: Self-pay | Admitting: Internal Medicine

## 2018-09-24 LAB — CUP PACEART REMOTE DEVICE CHECK
Battery Remaining Longevity: 19 mo
Brady Statistic AP VS Percent: 0.6 %
Brady Statistic AS VP Percent: 39.5 %
Brady Statistic AS VS Percent: 1.94 %
Brady Statistic RA Percent Paced: 54.65 %
Brady Statistic RV Percent Paced: 91.94 %
HighPow Impedance: 35 Ohm
HighPow Impedance: 43 Ohm
Implantable Lead Implant Date: 20041111
Implantable Lead Location: 753858
Implantable Lead Location: 753859
Implantable Lead Location: 753860
Implantable Lead Model: 6947
Lead Channel Impedance Value: 285 Ohm
Lead Channel Impedance Value: 399 Ohm
Lead Channel Impedance Value: 4047 Ohm
Lead Channel Pacing Threshold Amplitude: 0.625 V
Lead Channel Pacing Threshold Pulse Width: 0.4 ms
Lead Channel Sensing Intrinsic Amplitude: 1.75 mV
Lead Channel Sensing Intrinsic Amplitude: 1.75 mV
Lead Channel Setting Pacing Amplitude: 1.5 V
Lead Channel Setting Pacing Pulse Width: 0.4 ms
Lead Channel Setting Sensing Sensitivity: 0.3 mV
MDC IDC LEAD IMPLANT DT: 20040709
MDC IDC LEAD IMPLANT DT: 20130206
MDC IDC LEAD IMPLANT DT: 20150520
MDC IDC LEAD LOCATION: 753860
MDC IDC MSMT BATTERY VOLTAGE: 2.92 V
MDC IDC MSMT LEADCHNL LV IMPEDANCE VALUE: 4047 Ohm
MDC IDC MSMT LEADCHNL LV PACING THRESHOLD AMPLITUDE: 1.625 V
MDC IDC MSMT LEADCHNL LV PACING THRESHOLD PULSEWIDTH: 0.8 ms
MDC IDC MSMT LEADCHNL RA IMPEDANCE VALUE: 418 Ohm
MDC IDC MSMT LEADCHNL RA PACING THRESHOLD AMPLITUDE: 0.625 V
MDC IDC MSMT LEADCHNL RV IMPEDANCE VALUE: 342 Ohm
MDC IDC MSMT LEADCHNL RV PACING THRESHOLD PULSEWIDTH: 0.4 ms
MDC IDC MSMT LEADCHNL RV SENSING INTR AMPL: 4.375 mV
MDC IDC MSMT LEADCHNL RV SENSING INTR AMPL: 4.375 mV
MDC IDC PG IMPLANT DT: 20150520
MDC IDC SESS DTM: 20191205083523
MDC IDC SET LEADCHNL LV PACING AMPLITUDE: 2.5 V
MDC IDC SET LEADCHNL LV PACING PULSEWIDTH: 0.8 ms
MDC IDC SET LEADCHNL RV PACING AMPLITUDE: 2 V
MDC IDC STAT BRADY AP VP PERCENT: 57.96 %

## 2018-09-26 ENCOUNTER — Ambulatory Visit (HOSPITAL_COMMUNITY)
Admission: RE | Admit: 2018-09-26 | Discharge: 2018-09-26 | Disposition: A | Discharge status: Home / Self Care | Payer: Medicare Other | Source: Ambulatory Visit | Attending: Cardiology | Admitting: Cardiology

## 2018-09-26 DIAGNOSIS — I5022 Chronic systolic (congestive) heart failure: Secondary | ICD-10-CM | POA: Diagnosis present

## 2018-09-26 LAB — BASIC METABOLIC PANEL
Anion gap: 11 (ref 5–15)
BUN: 31 mg/dL — AB (ref 8–23)
CO2: 22 mmol/L (ref 22–32)
CREATININE: 1.59 mg/dL — AB (ref 0.61–1.24)
Calcium: 8.9 mg/dL (ref 8.9–10.3)
Chloride: 101 mmol/L (ref 98–111)
GFR calc Af Amer: 44 mL/min — ABNORMAL LOW (ref >60)
GFR, EST NON AFRICAN AMERICAN: 38 mL/min — AB (ref >60)
GLUCOSE: 201 mg/dL — AB (ref 70–99)
Potassium: 3.6 mmol/L (ref 3.5–5.1)
SODIUM: 134 mmol/L — AB (ref 135–145)

## 2018-09-27 ENCOUNTER — Ambulatory Visit (INDEPENDENT_AMBULATORY_CARE_PROVIDER_SITE_OTHER): Payer: Medicare Other

## 2018-09-27 DIAGNOSIS — Z95810 Presence of automatic (implantable) cardiac defibrillator: Secondary | ICD-10-CM

## 2018-09-27 DIAGNOSIS — I5022 Chronic systolic (congestive) heart failure: Secondary | ICD-10-CM

## 2018-09-27 NOTE — Progress Notes (Signed)
EPIC Encounter for ICM Monitoring  Patient Name: [name] is a 85 y.o. male Date: 09/27/2018 Primary Care Physican: [name], MD Primary Cardiologist:Harwani/McLean  Electrophysiologist: [name] Today'sWeight:152 lbs Bi-V Pacing: 94%  Spoke to [name], [place].  Patient is asymptomatic, no weight gain, leg swelling or shortness of breath.   Thoracic impedance abnormal suggesting fluid accumulation starting 08/28/2018.  Prescribed:Torsemide20 mgTake 1 tablet (20 mg) once every other day alternating with 2 tablets (40 mg) once every other day (increased on 09/17/2018)      Labs: 09/26/2018 Creatinine 1.59, BUN 31, Potassium 3.6, Sodium 134, eGFR 38-44 08/31/2018 Creatinine 1.79, BUN 31, Potassium 3.8, Sodium 132, eGFR 33-38 08/24/2018 Creatinine 2.05, BUN 41, Potassium 4.3, Sodium 131, eGFR 28-32 08/21/2018 Creatinine 1.97, BUN 37, Potassium 5.7, Sodium 132, eGFR 29-34  08/03/2018 Creatinine1.44, BUN14, Potassium3.6, Sodium135, eGFR43-50 08/01/2018 Creatinine1.58, BUN28, Potassium3.1, Sodium132, [id number]  07/03/2018 Creatinine1.62, BUN26, Potassium4.0, Sodium139, [id number]   A complete set of results can be found in Results Review.  Recommendations:Copy of ICM check sent to Dr.Taylor and Dr Aundra Dubin for review and if any recommendations will call back.   Follow-up plan: ICM clinic phone appointment on2/07/2019 to recheck fluid levels. Fluid levels will also be checked at office appointments scheduled 1/28/2020with HF clinic NP/PA and 10/09/2018 with Dr [name].   3 month ICM trend: 09/27/2018    1 Year ICM trend:       [name], RN 09/27/2018 1:18 PM

## 2018-09-30 NOTE — Progress Notes (Signed)
Increase torsemide to 40 mg daily.  BMET 10 days.

## 2018-10-01 MED ORDER — TORSEMIDE 20 MG PO TABS: ORAL_TABLET | ORAL | 3 refills | Status: DC

## 2018-10-01 NOTE — Progress Notes (Signed)
Attempted call to [name], DPR and left message to return call regarding Dr Claris Gladden recommendations to increase Torsemide to 40 mg and BMET in 10 days (10/11/2018).  Patient has office appointments scheduled 1/28/2020with HF clinic NP/PA and 10/09/2018 with Dr [name].

## 2018-10-01 NOTE — Progress Notes (Signed)
Returned call to [name], DPR.  Advised Dr Aundra Dubin ordered change in Torsemide dosage.  He should take Torsemide 20 mg 2 tablets (40 mg total) by mouth daily.  Advised he will need BMET drawn on 10/11/2018.  She stated appt with HF clinic tomorrow, 10/02/2018 was canceled.  Patient still has appt with Dr [name] 10/09/2018.  She verbalized understanding of Torsemide change and lab appointment made 10/11/2018 at HF clinic.

## 2018-10-02 ENCOUNTER — Encounter (HOSPITAL_COMMUNITY): Payer: Medicare Other

## 2018-10-04 ENCOUNTER — Other Ambulatory Visit: Payer: Self-pay | Admitting: Internal Medicine

## 2018-10-08 ENCOUNTER — Encounter (HOSPITAL_COMMUNITY): Payer: Self-pay | Admitting: Surgery

## 2018-10-09 ENCOUNTER — Ambulatory Visit: Payer: Medicare Other | Admitting: Internal Medicine

## 2018-10-09 ENCOUNTER — Encounter: Payer: Self-pay | Admitting: Internal Medicine

## 2018-10-09 ENCOUNTER — Encounter (INDEPENDENT_AMBULATORY_CARE_PROVIDER_SITE_OTHER): Payer: Self-pay

## 2018-10-09 VITALS — BP 124/62 | HR 63 | Ht 64.0 in | Wt 153.0 lb

## 2018-10-09 DIAGNOSIS — I2510 Atherosclerotic heart disease of native coronary artery without angina pectoris: Secondary | ICD-10-CM | POA: Diagnosis not present

## 2018-10-09 DIAGNOSIS — I472 Ventricular tachycardia, unspecified: Secondary | ICD-10-CM

## 2018-10-09 DIAGNOSIS — I5022 Chronic systolic (congestive) heart failure: Secondary | ICD-10-CM | POA: Diagnosis not present

## 2018-10-09 DIAGNOSIS — Z951 Presence of aortocoronary bypass graft: Secondary | ICD-10-CM

## 2018-10-09 DIAGNOSIS — Z95810 Presence of automatic (implantable) cardiac defibrillator: Secondary | ICD-10-CM

## 2018-10-09 NOTE — Patient Instructions (Addendum)
Medication Instructions:  Your physician has recommended you make the following change in your medication:   1.  Torsemide 20 mg---Take an additional tablet after lunch tomorrow and Thursday.  Then return to your normal routine.  Labwork: None ordered.  Testing/Procedures: None ordered.  Follow-Up: Your physician wants you to follow-up in: one year with Dr. [name].   You will receive a reminder letter in the mail two months in advance. If you don't receive a letter, please call our office to schedule the follow-up appointment.  Remote monitoring is used to monitor your ICD from home. This monitoring reduces the number of office visits required to check your device to one time per year. It allows [place] to keep an eye on the functioning of your device to ensure it is working properly. You are scheduled for a device check from home on 11/08/2018. You may send your transmission at any time that day. If you have a wireless device, the transmission will be sent automatically. After your physician reviews your transmission, you will receive a postcard with your next transmission date.  Any Other Special Instructions Will Be Listed Below (If Applicable).  If you need a refill on your cardiac medications before your next appointment, please call your pharmacy.

## 2018-10-09 NOTE — Progress Notes (Signed)
HPI [name] returns today for followup. He is a pleasant elderly man with chronic systolic heart failure, HTN, PVC's and CAD. He is s/p ICD insertion. He has a h/o VT with ATP. He denies anginal symptoms, and has had minimal edema. He denies dietary indiscretion. He has been in the hospital with a bowel obstruction. His incision has been draining and he has just had a wound vac placed. He still walks but is severely debilitated.  No Known Allergies   Current Outpatient Medications  Medication Sig Dispense Refill  . acetaminophen (TYLENOL) 325 MG tablet Take 2 tablets (650 mg total) by mouth every 6 (six) hours as needed for mild pain or fever.    Marland Kitchen allopurinol (ZYLOPRIM) 300 MG tablet Take 300 mg by mouth daily.      Marland Kitchen aspirin (RA ASPIRIN ADULT LOW STRENGTH) 81 MG EC tablet Take 1 tablet (81 mg total) by mouth daily.  0  . atorvastatin (LIPITOR) 40 MG tablet GIVE 1 TABLET BY MOUTH EVERY EVENING 30 tablet 3  . carvedilol (COREG) 6.25 MG tablet Take 1.5 tablets (9.375 mg total) by mouth 2 (two) times daily with a meal. 90 tablet 11  . chlorhexidine (PERIDEX) 0.12 % solution 15 mLs by Mouth Rinse route 2 (two) times daily. 120 mL 0  . escitalopram (LEXAPRO) 10 MG tablet Take 10 mg by mouth daily.    Marland Kitchen esomeprazole (NEXIUM) 20 MG capsule Take 20 mg by mouth daily.    . finasteride (PROSCAR) 5 MG tablet Take 5 mg by mouth every evening.    Marland Kitchen ketotifen (ZADITOR) 0.025 % ophthalmic solution Place 1 drop into both eyes daily as needed (dry eyes).     . Lactobacillus (ACIDOPHILUS PROBIOTIC PO) Take 1 tablet by mouth 2 (two) times daily.    Marland Kitchen LORazepam (ATIVAN) 0.5 MG tablet Take 1 tablet (0.5 mg total) by mouth 2 (two) times daily as needed for anxiety. 28 tablet 0  . NITROSTAT 0.4 MG SL tablet Place 0.4 mg under the tongue every 5 (five) minutes as needed for chest pain (MAX 3 TABLETS).     Marland Kitchen olopatadine (PATANOL) 0.1 % ophthalmic solution Place 1 drop into both eyes 2 (two) times daily.     Marland Kitchen spironolactone (ALDACTONE) 25 MG tablet Take 25 mg by mouth daily.    Marland Kitchen torsemide (DEMADEX) 20 MG tablet Take 2 tablets (40 mg total) by mouth daily. 180 tablet 3   No current facility-administered medications for this visit.      Past Medical History:  Diagnosis Date  . AICD (automatic cardioverter/defibrillator) present   . Arthritis    "shoulders" (10/31/2014)  . CAD (coronary artery disease)    s/p CABG; s/p Pacemaker  . Diverticulosis of colon   . GERD (gastroesophageal reflux disease)   . Gout   . Hyperlipidemia   . Hypertension   . Ischemic cardiomyopathy    s/p ICD  . On home oxygen therapy    "2L prn" (10/31/2014)  . Paroxysmal ventricular tachycardia ([place])   . SBO (small bowel obstruction) ([place]) 10/31/2014  . Shortness of breath dyspnea    with exertion  . Systolic CHF with reduced left ventricular function, NYHA class 2 ([place])     ROS:   All systems reviewed and negative except as noted in the HPI.   Past Surgical History:  Procedure Laterality Date  . BI-VENTRICULAR IMPLANTABLE CARDIOVERTER DEFIBRILLATOR UPGRADE N/A 01/22/2014   Procedure: BI-VENTRICULAR IMPLANTABLE CARDIOVERTER DEFIBRILLATOR UPGRADE;  Surgeon: [name]  [name] Najjar, MD;  Location: [facility] CATH LAB;  Service: Cardiovascular;  Laterality: N/A;  . BOWEL RESECTION    . CARDIAC CATHETERIZATION  03/2011   [name] 01/18/2011  . CARDIAC DEFIBRILLATOR PLACEMENT  07/2003   [name] 01/18/2011  . CATARACT EXTRACTION W/ INTRAOCULAR LENS  IMPLANT, BILATERAL Bilateral   . CATARACT EXTRACTION W/PHACO Right 12/17/2014   Procedure: PHACOEMULSIFICATION CATARACT EXTRACTION WITH IOL IMPLANT RIGHT EYE;  Surgeon: [name] Pearson, MD;  Location: [place];  Service: Ophthalmology;  Laterality: Right;  . CHOLECYSTECTOMY    . CORONARY ANGIOPLASTY WITH STENT PLACEMENT  04/2011   2 stents/notes 05/03/2011  . CORONARY ARTERY BYPASS GRAFT  03/2003   CABG X3/notes 01/18/2011  . ESOPHAGOGASTRODUODENOSCOPY (EGD) WITH PROPOFOL N/A 06/29/2018    Procedure: ESOPHAGOGASTRODUODENOSCOPY (EGD) WITH PROPOFOL;  Surgeon: [name] Corner, MD;  Location: [facility] ENDOSCOPY;  Service: Endoscopy;  Laterality: N/A;  . EYE SURGERY Bilateral    [name]  . IMPLANTABLE CARDIOVERTER DEFIBRILLATOR (ICD) GENERATOR CHANGE Left 10/12/2011   Procedure: ICD GENERATOR CHANGE;  Surgeon: [name] Lance, MD;  Location: [facility] CATH LAB;  Service: Cardiovascular;  Laterality: Left;  . LAPAROTOMY N/A 06/26/2018   Procedure: EXPLORATORY LAPAROTOMY SMALL BOWEL RESECTION X 2;  Surgeon: [name] Roup, MD;  Location: [facility] ORS;  Service: General;  Laterality: N/A;  . PACEMAKER PLACEMENT    . PACEMAKER REVISION N/A 10/12/2011   Procedure: PACEMAKER REVISION;  Surgeon: [name] Lance, MD;  Location: [facility] CATH LAB;  Service: Cardiovascular;  Laterality: N/A;  . TEE WITH CARDIOVERSION  03/2003   [name] 01/18/2011  . TONSILLECTOMY       Family History  Problem Relation Age of Onset  . Heart Problems Mother   . Diabetes Mother   . Heart Problems Brother      Social History   Socioeconomic History  . Marital status: Divorced    Spouse name: Not on file  . Number of children: Not on file  . Years of education: Not on file  . Highest education level: Not on file  Occupational History  . Occupation: Retired  Scientific laboratory technician  . Financial resource strain: Not on file  . Food insecurity:    Worry: Not on file    Inability: Not on file  . Transportation needs:    Medical: Not on file    Non-medical: Not on file  Tobacco Use  . Smoking status: Never Smoker  . Smokeless tobacco: Former Systems developer    Types: Chew  . Tobacco comment: no chew in over 3 years  Substance and Sexual Activity  . Alcohol use: Yes    Comment: "might take a drink during the holidays"  . Drug use: No  . Sexual activity: Never  Lifestyle  . Physical activity:    Days per week: Not on file    Minutes per session: Not on file  . Stress: Not on file  Relationships  . Social connections:    Talks on  phone: Not on file    Gets together: Not on file    Attends religious service: Not on file    Active member of club or organization: Not on file    Attends meetings of clubs or organizations: Not on file    Relationship status: Not on file  . Intimate partner violence:    Fear of current or ex partner: Not on file    Emotionally abused: Not on file    Physically abused: Not on file    Forced sexual activity: Not on file  Other Topics Concern  . Not on file  Social History Narrative  . Not on file     BP 124/62   Pulse 63   Ht 5\' 4"  (1.626 m)   Wt 153 lb (69.4 kg)   SpO2 97%   BMI 26.26 kg/m   Physical Exam:  Well appearing NAD HEENT: Unremarkable Neck:  No JVD, no thyromegally Lymphatics:  No adenopathy Back:  No CVA tenderness Lungs:  Clear with no wheezes HEART:  Regular rate rhythm, no murmurs, no rubs, no clicks Abd:  soft, positive bowel sounds, no organomegally, no rebound, no guarding, wound vac in place Ext:  2 plus pulses, no edema, no cyanosis, no clubbing Skin:  No rashes no nodules Neuro:  CN II through XII intact, motor grossly intact   DEVICE  Normal device function.  See PaceArt for details.   Assess/Plan: 1. Chronic systolic heart failure - he optivol is up and I have asked him to take an extra torsemide for 2 days. 2. VT - he has had several episodes with successful ATP. 3. HTN - his blood pressure is well controlled. I encouraged him to avoid salty foods. 4. ICD - his medtronic Biv ICD is working normally. We will recheck in several months.  Mikle Bosworth.D.

## 2018-10-10 LAB — CUP PACEART INCLINIC DEVICE CHECK
Battery Voltage: 2.91 V
Brady Statistic AP VP Percent: 70.13 %
Brady Statistic AS VP Percent: 28.32 %
Brady Statistic RA Percent Paced: 67.82 %
Brady Statistic RV Percent Paced: 94.55 %
Date Time Interrogation Session: 20200204213258
HighPow Impedance: 34 Ohm
HighPow Impedance: 47 Ohm
Implantable Lead Implant Date: 20040709
Implantable Lead Implant Date: 20130206
Implantable Lead Implant Date: 20150520
Implantable Lead Location: 753858
Implantable Lead Location: 753859
Implantable Lead Model: 5076
Implantable Lead Model: 5076
Implantable Lead Model: 6947
Implantable Pulse Generator Implant Date: 20150520
Lead Channel Impedance Value: 285 Ohm
Lead Channel Impedance Value: 456 Ohm
Lead Channel Pacing Threshold Amplitude: 0.75 V
Lead Channel Pacing Threshold Pulse Width: 0.4 ms
Lead Channel Setting Pacing Pulse Width: 0.4 ms
Lead Channel Setting Sensing Sensitivity: 0.3 mV
MDC IDC LEAD IMPLANT DT: 20041111
MDC IDC LEAD LOCATION: 753860
MDC IDC LEAD LOCATION: 753860
MDC IDC MSMT BATTERY REMAINING LONGEVITY: 21 mo
MDC IDC MSMT LEADCHNL LV IMPEDANCE VALUE: 4047 Ohm
MDC IDC MSMT LEADCHNL LV IMPEDANCE VALUE: 4047 Ohm
MDC IDC MSMT LEADCHNL LV PACING THRESHOLD AMPLITUDE: 1.5 V
MDC IDC MSMT LEADCHNL LV PACING THRESHOLD PULSEWIDTH: 0.8 ms
MDC IDC MSMT LEADCHNL RA PACING THRESHOLD PULSEWIDTH: 0.4 ms
MDC IDC MSMT LEADCHNL RA SENSING INTR AMPL: 2.5 mV
MDC IDC MSMT LEADCHNL RV IMPEDANCE VALUE: 304 Ohm
MDC IDC MSMT LEADCHNL RV IMPEDANCE VALUE: 418 Ohm
MDC IDC MSMT LEADCHNL RV PACING THRESHOLD AMPLITUDE: 0.75 V
MDC IDC MSMT LEADCHNL RV SENSING INTR AMPL: 4.5 mV
MDC IDC SET LEADCHNL LV PACING AMPLITUDE: 2.5 V
MDC IDC SET LEADCHNL LV PACING PULSEWIDTH: 0.8 ms
MDC IDC SET LEADCHNL RA PACING AMPLITUDE: 1.5 V
MDC IDC SET LEADCHNL RV PACING AMPLITUDE: 2 V
MDC IDC STAT BRADY AP VS PERCENT: 0.62 %
MDC IDC STAT BRADY AS VS PERCENT: 0.93 %

## 2018-10-11 ENCOUNTER — Ambulatory Visit (HOSPITAL_COMMUNITY)
Admission: RE | Admit: 2018-10-11 | Discharge: 2018-10-11 | Disposition: A | Discharge status: Home / Self Care | Payer: Medicare Other | Source: Ambulatory Visit | Attending: Internal Medicine | Admitting: Internal Medicine

## 2018-10-11 DIAGNOSIS — I5022 Chronic systolic (congestive) heart failure: Secondary | ICD-10-CM | POA: Diagnosis not present

## 2018-10-11 LAB — BASIC METABOLIC PANEL
Anion gap: 11 (ref 5–15)
BUN: 26 mg/dL — AB (ref 8–23)
CO2: 26 mmol/L (ref 22–32)
Calcium: 8.6 mg/dL — ABNORMAL LOW (ref 8.9–10.3)
Chloride: 100 mmol/L (ref 98–111)
Creatinine, Ser: 1.53 mg/dL — ABNORMAL HIGH (ref 0.61–1.24)
GFR calc Af Amer: 46 mL/min — ABNORMAL LOW (ref >60)
GFR calc non Af Amer: 40 mL/min — ABNORMAL LOW (ref >60)
GLUCOSE: 238 mg/dL — AB (ref 70–99)
POTASSIUM: 3.2 mmol/L — AB (ref 3.5–5.1)
Sodium: 137 mmol/L (ref 135–145)

## 2018-10-15 ENCOUNTER — Other Ambulatory Visit (HOSPITAL_COMMUNITY): Payer: Self-pay

## 2018-10-16 ENCOUNTER — Ambulatory Visit (INDEPENDENT_AMBULATORY_CARE_PROVIDER_SITE_OTHER): Payer: Medicare Other

## 2018-10-16 DIAGNOSIS — I5022 Chronic systolic (congestive) heart failure: Secondary | ICD-10-CM

## 2018-10-16 DIAGNOSIS — Z95810 Presence of automatic (implantable) cardiac defibrillator: Secondary | ICD-10-CM

## 2018-10-17 ENCOUNTER — Ambulatory Visit (HOSPITAL_COMMUNITY)
Admission: RE | Admit: 2018-10-17 | Discharge: 2018-10-17 | Disposition: A | Discharge status: Home / Self Care | Payer: Medicare Other | Source: Ambulatory Visit | Attending: Internal Medicine | Admitting: Internal Medicine

## 2018-10-17 ENCOUNTER — Other Ambulatory Visit: Payer: Self-pay

## 2018-10-17 VITALS — BP 118/66 | HR 60 | Wt 153.4 lb

## 2018-10-17 DIAGNOSIS — Z951 Presence of aortocoronary bypass graft: Secondary | ICD-10-CM | POA: Diagnosis not present

## 2018-10-17 DIAGNOSIS — Z833 Family history of diabetes mellitus: Secondary | ICD-10-CM | POA: Diagnosis not present

## 2018-10-17 DIAGNOSIS — E876 Hypokalemia: Secondary | ICD-10-CM | POA: Insufficient documentation

## 2018-10-17 DIAGNOSIS — N400 Enlarged prostate without lower urinary tract symptoms: Secondary | ICD-10-CM | POA: Diagnosis not present

## 2018-10-17 DIAGNOSIS — K439 Ventral hernia without obstruction or gangrene: Secondary | ICD-10-CM | POA: Insufficient documentation

## 2018-10-17 DIAGNOSIS — N183 Chronic kidney disease, stage 3 unspecified: Secondary | ICD-10-CM

## 2018-10-17 DIAGNOSIS — K219 Gastro-esophageal reflux disease without esophagitis: Secondary | ICD-10-CM | POA: Insufficient documentation

## 2018-10-17 DIAGNOSIS — I5022 Chronic systolic (congestive) heart failure: Secondary | ICD-10-CM | POA: Diagnosis not present

## 2018-10-17 DIAGNOSIS — Z7982 Long term (current) use of aspirin: Secondary | ICD-10-CM | POA: Diagnosis not present

## 2018-10-17 DIAGNOSIS — Z95810 Presence of automatic (implantable) cardiac defibrillator: Secondary | ICD-10-CM

## 2018-10-17 DIAGNOSIS — I130 Hypertensive heart and chronic kidney disease with heart failure and stage 1 through stage 4 chronic kidney disease, or unspecified chronic kidney disease: Secondary | ICD-10-CM | POA: Insufficient documentation

## 2018-10-17 DIAGNOSIS — I255 Ischemic cardiomyopathy: Secondary | ICD-10-CM | POA: Diagnosis not present

## 2018-10-17 DIAGNOSIS — I2510 Atherosclerotic heart disease of native coronary artery without angina pectoris: Secondary | ICD-10-CM | POA: Diagnosis not present

## 2018-10-17 DIAGNOSIS — E785 Hyperlipidemia, unspecified: Secondary | ICD-10-CM | POA: Diagnosis not present

## 2018-10-17 DIAGNOSIS — I472 Ventricular tachycardia, unspecified: Secondary | ICD-10-CM

## 2018-10-17 DIAGNOSIS — I4892 Unspecified atrial flutter: Secondary | ICD-10-CM | POA: Insufficient documentation

## 2018-10-17 DIAGNOSIS — M109 Gout, unspecified: Secondary | ICD-10-CM | POA: Diagnosis not present

## 2018-10-17 DIAGNOSIS — I071 Rheumatic tricuspid insufficiency: Secondary | ICD-10-CM | POA: Diagnosis not present

## 2018-10-17 DIAGNOSIS — Z79899 Other long term (current) drug therapy: Secondary | ICD-10-CM | POA: Diagnosis not present

## 2018-10-17 NOTE — Progress Notes (Addendum)
Advanced Heart Failure Clinic Note   PCP: Dr. Ardeth Perfect HF Cardiology: Dr. Devin Going [name] is a 85 y.o. male with CAD s/p CABG and chronic systolic CHF from ischemic cardiomyopathy presents for HF clinic evaluation.  He has a Medtronic CRT-D device.  Echo in 11/15 showed EF 20-25%. He had VT x 2 in 9/17 terminated by ATP.   Admitted from clinic 06/10/16 with orthostasis and 24 lb weight loss after switch from Lasix to torsemide.  Optivol with fluid index well below threshold and impedance continuing up.  With orthostasis, light headedness, and living alone pt was admitted for dehydration. AKI noted on labwork. Diuretics held on admission and given IV fluid. Entresto stopped. Dizziness/orthostasis improved.  Torsemide resumed 06/13/16, then cut back further on 10/10 and again on 06/15/16 for uptrend in BUN.  Repeat Echo (10/17) showed EF 35-40%, mildly dilated RV with mild to moderately decreased systolic function, severe TR, PASP 48 mmHg.   Echo in 1/19 showed  EF 35% with wall motion abnormalities, mildly decreased RV systolic function, moderate TR.   He was admitted in 10/19 with ventral incisional hernia with incarcerated small bowel with ischemia.  He had resection of small bowel in the OR.   He presents today for regular follow up. Doing well overall. Weight stable at home. Still having wound care to his abdomen from his bowel resection in October. Site stable per his care-taker. Walks with a cane. Does his ADLs OK. Remains SOB after about 100 feet. No orthopnea or dyspnea. Denies CP or edema. Recently started back on K. Only started Monday. Taking all medications as directed.   Medtronic Optivol: Thoracic impedence below threshold after uptrend since Dec 26. ? If needs to be calibrated, as it is a marked difference with only a 4 lb difference in his weight. Pt activity nearly null. Personally reviewed.   Labs (9/17): creatinine 1.13, K 3.4, hgb 9.4, BNP 2230 => 2270 Labs (10/17): K  4.1, creatinine 1.56  Labs (11/17): K 3.9, creatinine 1.36, BNP 1183 Labs (3/18): K 4.3, creatinine 1.95 Labs (12/18): K 4.1, creatinine 2.16 Labs (1/19): LDL 66, HDL 47 Labs (4/19): K 4.1, creatinine 1.75 Labs (6/19): K 4.5, creatinine 2.32 Labs (7/19): K 4.7, creatinine 1.9 Labs (11/19): K 3.6, creatinine 1.44 Labs (12/19): K 5.7, creatinine 1.97  PMH: 1. Atrial flutter: Paroxysmal. 2. PVCs, h/o VT.  3. Chronic systolic CHF: Ischemic cardiomyopathy.  Medtronic CRT-D.   - Echo (11/15): EF 20-25%, mildly dilated LV.   - Echo (10/17) showed EF 35-40%, mildly dilated RV with mild to moderately decreased systolic function, severe TR, PASP 48 mmHg. - Echo (1/19) with EF 35%, regional WMAs, mildly decreased RV systolic function, moderate TR.  4. H/o SBO 5. HTN 6. Gout  7. Hyperlipidemia 8. GERD 9. CAD: s/p CABG in 5/12 with LIMA-LAD, SVG-ramus, SVG-D1, SVG-PDA. - PCI to OM1 and proximal LCx in 8/12.  10. CKD: Stage 3.  11. BPH 12. Ventral hernia with incarcerated small bowel, s/p resection of small bowel in OR in 10/19.   SH: Nonsmoker, lives in apartment, no ETOH.    Family History  Problem Relation Age of Onset  . Heart Problems Mother   . Diabetes Mother   . Heart Problems Brother    Review of systems complete and found to be negative unless listed in HPI.    Current Outpatient Medications  Medication Sig Dispense Refill  . acetaminophen (TYLENOL) 325 MG tablet Take 2 tablets (650 mg total) by mouth  every 6 (six) hours as needed for mild pain or fever.    Marland Kitchen allopurinol (ZYLOPRIM) 300 MG tablet Take 300 mg by mouth daily.      Marland Kitchen aspirin (RA ASPIRIN ADULT LOW STRENGTH) 81 MG EC tablet Take 1 tablet (81 mg total) by mouth daily.  0  . atorvastatin (LIPITOR) 40 MG tablet GIVE 1 TABLET BY MOUTH EVERY EVENING 30 tablet 3  . carvedilol (COREG) 6.25 MG tablet Take 1.5 tablets (9.375 mg total) by mouth 2 (two) times daily with a meal. 90 tablet 11  . chlorhexidine (PERIDEX) 0.12 %  solution 15 mLs by Mouth Rinse route 2 (two) times daily. 120 mL 0  . escitalopram (LEXAPRO) 10 MG tablet Take 10 mg by mouth daily.    Marland Kitchen esomeprazole (NEXIUM) 20 MG capsule Take 20 mg by mouth daily.    . finasteride (PROSCAR) 5 MG tablet Take 5 mg by mouth every evening.    Marland Kitchen ketotifen (ZADITOR) 0.025 % ophthalmic solution Place 1 drop into both eyes daily as needed (dry eyes).     . Lactobacillus (ACIDOPHILUS PROBIOTIC PO) Take 1 tablet by mouth 2 (two) times daily.    Marland Kitchen LORazepam (ATIVAN) 0.5 MG tablet Take 1 tablet (0.5 mg total) by mouth 2 (two) times daily as needed for anxiety. 28 tablet 0  . NITROSTAT 0.4 MG SL tablet Place 0.4 mg under the tongue every 5 (five) minutes as needed for chest pain (MAX 3 TABLETS).     Marland Kitchen olopatadine (PATANOL) 0.1 % ophthalmic solution Place 1 drop into both eyes 2 (two) times daily.    Marland Kitchen spironolactone (ALDACTONE) 25 MG tablet Take 25 mg by mouth daily.    Marland Kitchen torsemide (DEMADEX) 20 MG tablet Take 2 tablets (40 mg total) by mouth daily. 180 tablet 3   No current facility-administered medications for this visit.    Vitals:   10/17/18 1202  BP: 118/66  Pulse: 60  SpO2: 98%  Weight: 69.6 kg (153 lb 6.4 oz)    Wt Readings from Last 3 Encounters:  10/17/18 69.6 kg (153 lb 6.4 oz)  10/09/18 69.4 kg (153 lb)  08/21/18 67.9 kg (149 lb 9.6 oz)   General: Elderly appearing. NAD.  HEENT: Normal Neck: Supple. JVP 5-6. Carotids 2+ bilat; no bruits. No thyromegaly or nodule noted. Cor: PMI nondisplaced. RRR, ? 1/6 TR Lungs: CTAB, normal effort. Abdomen: Soft, non-tender, non-distended, no HSM. No bruits or masses. +BS  Extremities: No cyanosis, clubbing, or rash. R and LLE no edema.  Neuro: Alert & orientedx3, cranial nerves grossly intact. moves all 4 extremities w/o difficulty. Affect pleasant   Assessment/Plan: 1. Chronic systolic CHF: Ischemic cardiomyopathy.  He has a Medtronic CRT-D device.  Echo in 1/19 showed EF 35% which is stable.  NYHA III  symptoms in setting of age related deconditioning. Volume status looks OK on exam.  - Continue torsemide 40 mg daily.  - He has failed Entresto with marked hypotension and is off lisinopril with rise in creatinine.  - He is off spiro with recent hyPERK. But now being treated for hyPOK.  - Continue Coreg 9.375 mg bid.   - Repeat BMET 1 week with addition of potassium.  2. CAD: s/p CABG.   - No s/s of ischemia.    - Continue statin and ASA 81 daily.   3. VT: ATP 9/17 was successful.   - Quiescent. Following with EP.  4. Tricuspid regurgitation: Moderate on 1/19 echo.  No change.  5. CKD III:  -  Cr 1.53 10/11/2018.  6. Hypokalemia - K 3.2 10/11/2018. BMET today.  7.  Ventral hernia with incarcerated small bowel -s/p resection of small bowel in OR in 10/19.  - Dressing C/D/I.   Doing well overall. Has only had 2 doses of potassium, so will defer labs to early next week. RTC 3 months. Sooner with symptoms.   Shirley Friar, PA-C  10/17/2018   Greater than 50% of the 25 minute visit was spent in counseling/coordination of care regarding disease state education, salt/fluid restriction, sliding scale diuretics, and medication compliance.

## 2018-10-17 NOTE — Patient Instructions (Signed)
Labs in 1 week We will only contact you if something comes back abnormal or we need to make some changes. Otherwise no news is good news!  Your physician recommends that you schedule a follow-up appointment in: 3 months with Dr. Aundra Dubin

## 2018-10-17 NOTE — Progress Notes (Signed)
EPIC Encounter for ICM Monitoring  Patient Name: [name] is a 85 y.o. male Date: 10/17/2018 Primary Care Physican: [name], MD Primary Cardiologist:Harwani/McLean  Electrophysiologist: [name] Last Weight:152 lbs Bi-V Pacing: 94.3%       Patient seen by Dr [name] 2/4 and was instructed to increase Torsemide x 2 days due to volume overload. He has an appt with HF clinic today.   Patient has meals on wheels daily which does not have an option of low sodium meals   Report: Thoracic impedance abnormal.   Prescribed: Torsemide20 mgTake 2 tables (40 mg) daily.  Potassium 20 mEq take 1 tablet daily.  Labs: 10/11/2018 Creatinine 1.53, BUN 26, Potassium 3.2, Sodium 137, GFR 40-46 09/26/2018 Creatinine 1.59, BUN 31, Potassium 3.6, Sodium 134, GFR 38-44 08/31/2018 Creatinine1.79, BUN31, Potassium3.8, Sodium132, GFR33-38 08/24/2018 Creatinine2.05, BUN41, Potassium4.3, Sodium131, [id number] 08/21/2018 Creatinine1.97, BUN37, Potassium5.7, Sodium132, GFR 29-34 08/03/2018 Creatinine1.44, BUN14, Potassium3.6, Sodium135, GFR43-50 08/01/2018 Creatinine1.58, BUN28, Potassium3.1, Sodium132, [id number]  07/03/2018 Creatinine1.62, BUN26, Potassium4.0, Sodium139, [id number]  A complete set of results can be found in Results Review.  Recommendations: Recommendations will be given at HF clinic at today's appt  Follow-up plan: ICM clinic phone appointment on 10/22/2018 to recheck fluid levels.   Office appt 10/17/2018 with HF clinic PA/NP.    Copy of ICM check sent to Dr. [name].   3 month ICM trend: 10/16/2018    1 Year ICM trend:       [name], RN 10/17/2018 9:56 AM

## 2018-10-17 NOTE — Addendum Note (Signed)
Encounter addended by: Shirley Friar, PA-C on: 10/17/2018 1:10 PM  Actions taken: Clinical Note Signed

## 2018-10-22 ENCOUNTER — Ambulatory Visit (INDEPENDENT_AMBULATORY_CARE_PROVIDER_SITE_OTHER): Payer: Medicare Other

## 2018-10-22 DIAGNOSIS — Z95810 Presence of automatic (implantable) cardiac defibrillator: Secondary | ICD-10-CM

## 2018-10-22 DIAGNOSIS — I5022 Chronic systolic (congestive) heart failure: Secondary | ICD-10-CM

## 2018-10-22 NOTE — Progress Notes (Signed)
EPIC Encounter for ICM Monitoring  Patient Name: [name] is a 85 y.o. male Date: 10/22/2018 Primary Care Physican: [name] Hatchet, MD Primary Cardiologist:[name]/[name]  Electrophysiologist: [name] Last Weight:152 lbs Bi-V Pacing: 97.6%                                                           Attempted call to friend, [name].  Patient did not have any fluid on exam at HF clinic appt 10/17/2018.     Report: Thoracic impedance abnormal suggesting fluid accumulation since mid December.   Prescribed: Torsemide20 mgTake 2 tables (40 mg) daily.  Potassium 20 mEq take 1 tablet daily.  Labs: 10/11/2018 Creatinine 1.53, BUN 26, Potassium 3.2, Sodium 137, GFR 40-46 09/26/2018 Creatinine 1.59, BUN 31, Potassium 3.6, Sodium 134, GFR 38-44 08/31/2018 Creatinine1.79, BUN31, Potassium3.8, Sodium132, GFR33-38 08/24/2018 Creatinine2.05, BUN41, Potassium4.3, Sodium131, [id number] 08/21/2018 Creatinine1.97, BUN37, Potassium5.7, Sodium132, GFR 29-34 08/03/2018 Creatinine1.44, BUN14, Potassium3.6, Sodium135, GFR43-50 08/01/2018 Creatinine1.58, BUN28, Potassium3.1, Sodium132, [id number]  07/03/2018 Creatinine1.62, BUN26, Potassium4.0, Sodium139, [id number]  A complete set of results can be found in Results Review.  Recommendations: Left voice mail with ICM number and encouraged to call if experiencing any fluid symptoms.  Follow-up plan: ICM clinic phone appointment on 11/05/2018 to recheck fluid levels.   Copy of ICM check sent to Dr. Lovena [name] and Dr [name] for review and if any recommendations will call back.   3 month ICM trend: 10/22/2018    1 Year ICM trend:       [name] Billings, RN 10/22/2018 10:48 AM

## 2018-10-25 ENCOUNTER — Ambulatory Visit (HOSPITAL_COMMUNITY)
Admission: RE | Admit: 2018-10-25 | Discharge: 2018-10-25 | Disposition: A | Discharge status: Home / Self Care | Payer: Medicare Other | Source: Ambulatory Visit | Attending: Cardiology | Admitting: Cardiology

## 2018-10-25 DIAGNOSIS — I5022 Chronic systolic (congestive) heart failure: Secondary | ICD-10-CM

## 2018-10-25 LAB — BASIC METABOLIC PANEL
ANION GAP: 9 (ref 5–15)
BUN: 42 mg/dL — ABNORMAL HIGH (ref 8–23)
CALCIUM: 9 mg/dL (ref 8.9–10.3)
CO2: 25 mmol/L (ref 22–32)
Chloride: 98 mmol/L (ref 98–111)
Creatinine, Ser: 1.87 mg/dL — ABNORMAL HIGH (ref 0.61–1.24)
GFR, EST AFRICAN AMERICAN: 36 mL/min — AB (ref >60)
GFR, EST NON AFRICAN AMERICAN: 31 mL/min — AB (ref >60)
GLUCOSE: 234 mg/dL — AB (ref 70–99)
Potassium: 4.4 mmol/L (ref 3.5–5.1)
SODIUM: 132 mmol/L — AB (ref 135–145)

## 2018-10-25 NOTE — Progress Notes (Signed)
ReDS Vest - 10/25/18 1300      ReDS Vest   Fitting Posture  Sitting    Height Marker  Short   stattion A   Ruler Value  29    Center Strip  Aligned    ReDS Value  19

## 2018-10-26 ENCOUNTER — Telehealth (HOSPITAL_COMMUNITY): Payer: Self-pay

## 2018-10-26 DIAGNOSIS — I5022 Chronic systolic (congestive) heart failure: Secondary | ICD-10-CM

## 2018-10-26 MED ORDER — TORSEMIDE 20 MG PO TABS: ORAL_TABLET | ORAL | 0 refills | Status: DC

## 2018-10-26 NOTE — Telephone Encounter (Signed)
error 

## 2018-10-26 NOTE — Progress Notes (Signed)
Increase torsemide to 40 mg bid x 3 days then 40 qam/20 qpm. BMET next week.

## 2018-10-26 NOTE — Progress Notes (Signed)
Attempted call to friend, [name] and left message to return call regarding Dr Claris Gladden recommendations.

## 2018-10-26 NOTE — Progress Notes (Signed)
Received return call from [name].  She stated she spoke with HF clinic this morning and Torsemide dosage has been adjusted to 40 mg every other day alternating with 20 mg every other day.  She stated Oda Kilts, PA checked patient using ReDS vest 2/20 along with lab drawn. She reported ReDS vest was 19 was told patient is on the dry side. Advised I will inform Dr Aundra Dubin of medication adjust that was done today and ask if a BMET is still needed next week.  Advised to follow Torsemide instructions given today by HF clinic CMA.  Will call back if there are any further changes.  She said if patient needs labs next week the best day will be Wednesday 2/26 between 12-12:30 since he has a PCP appt at 2:00 PM the same day.  Message sent to Dr Aundra Dubin.

## 2018-10-29 NOTE — Progress Notes (Signed)
Received: Yesterday  Message Contents  Larey Dresser, MD  Rennie Hack Panda, RN        Yes, keep dose that [facility] prescribed.

## 2018-10-31 ENCOUNTER — Telehealth (HOSPITAL_COMMUNITY): Payer: Self-pay

## 2018-10-31 DIAGNOSIS — I5022 Chronic systolic (congestive) heart failure: Secondary | ICD-10-CM

## 2018-10-31 NOTE — Telephone Encounter (Signed)
Pts wife called in asking if patient needs to repeat labs since changing Torsemide dose.  Per Jonni Sanger PA, pt can repeat BMET in 2 weeks.  Lab appt made for 3/11 2p. Wife appreciative.

## 2018-11-05 ENCOUNTER — Telehealth: Payer: Self-pay

## 2018-11-05 ENCOUNTER — Ambulatory Visit (INDEPENDENT_AMBULATORY_CARE_PROVIDER_SITE_OTHER): Payer: Medicare Other

## 2018-11-05 DIAGNOSIS — Z95810 Presence of automatic (implantable) cardiac defibrillator: Secondary | ICD-10-CM

## 2018-11-05 DIAGNOSIS — I5022 Chronic systolic (congestive) heart failure: Secondary | ICD-10-CM

## 2018-11-05 NOTE — Telephone Encounter (Signed)
Remote ICM transmission received.  Attempted call to friend, [name] per [facility] regarding ICM remote transmission and left detailed message, with next ICM remote transmission date of 10/21/2018.  Advised to return call for any fluid symptoms or questions.

## 2018-11-05 NOTE — Progress Notes (Signed)
EPIC Encounter for ICM Monitoring  Patient Name: KHRIZ LIDDY is a 85 y.o. male Date: 11/05/2018 Primary Care Physican: [name], MD Primary Cardiologist:Harwani/McLean  Electrophysiologist: [name] LastWeight:152 lbs Bi-V Pacing: 88.3%   Attempted call to friend, [name].  Left detailed message regarding transmission per DPR.  Transmission reviewed.  Report: Thoracic impedance has returned close to baseline normal since adjusting Torsemide 10/25/2018.  Prescribed:Torsemide20 mgtake 2 tablets (40 mg total) every other day alternating with 1 tablet (20 mg total) every other day (changed on 2/20 per lab note). Potassium 20 mEq take 1 tablet daily.  Labs: Repeat BMET scheduled 11/14/2018. 10/11/2018 Creatinine 1.53, BUN 26, Potassium 3.2, Sodium 137, GFR 40-46 09/26/2018 Creatinine 1.59, BUN 31, Potassium 3.6, Sodium 134, GFR 38-44 08/31/2018 Creatinine1.79, BUN31, Potassium3.8, Sodium132, GFR33-38 08/24/2018 Creatinine2.05, BUN41, Potassium4.3, Sodium131, [id number] 08/21/2018 Creatinine1.97, BUN37, Potassium5.7, Sodium132, GFR 29-34 08/03/2018 Creatinine1.44, BUN14, Potassium3.6, Sodium135, GFR43-50 08/01/2018 Creatinine1.58, BUN28, Potassium3.1, Sodium132, [id number]  07/03/2018 Creatinine1.62, BUN26, Potassium4.0, Sodium139, [id number]  A complete set of results can be found in Results Review.  Recommendations:Left voice mail with ICM number and encouraged to call if experiencing any fluid symptoms.  Follow-up plan: ICM clinic phone appointment on 11/19/2018.  Copy of ICM check sent to Dr.Taylor and Dr Aundra Dubin   3 month ICM trend: 11/05/2018    1 Year ICM trend:       [name], RN 11/05/2018 4:58 PM

## 2018-11-08 ENCOUNTER — Ambulatory Visit (INDEPENDENT_AMBULATORY_CARE_PROVIDER_SITE_OTHER): Payer: Medicare Other | Admitting: *Deleted

## 2018-11-08 DIAGNOSIS — I472 Ventricular tachycardia, unspecified: Secondary | ICD-10-CM

## 2018-11-08 DIAGNOSIS — I255 Ischemic cardiomyopathy: Secondary | ICD-10-CM

## 2018-11-09 LAB — CUP PACEART REMOTE DEVICE CHECK
Battery Remaining Longevity: 18 mo
Battery Voltage: 2.91 V
Brady Statistic AP VP Percent: 85.54 %
Brady Statistic AP VS Percent: 1.36 %
Brady Statistic AS VS Percent: 0.83 %
Brady Statistic RA Percent Paced: 79.5 %
Brady Statistic RV Percent Paced: 90.36 %
Date Time Interrogation Session: 20200305062204
HIGH POWER IMPEDANCE MEASURED VALUE: 54 Ohm
HighPow Impedance: 41 Ohm
Implantable Lead Implant Date: 20040709
Implantable Lead Implant Date: 20041111
Implantable Lead Implant Date: 20130206
Implantable Lead Implant Date: 20150520
Implantable Lead Location: 753858
Implantable Lead Location: 753859
Implantable Lead Location: 753860
Implantable Lead Location: 753860
Implantable Lead Model: 5071
Implantable Lead Model: 5076
Implantable Lead Model: 5076
Implantable Pulse Generator Implant Date: 20150520
Lead Channel Impedance Value: 361 Ohm
Lead Channel Impedance Value: 4047 Ohm
Lead Channel Impedance Value: 4047 Ohm
Lead Channel Impedance Value: 475 Ohm
Lead Channel Impedance Value: 475 Ohm
Lead Channel Pacing Threshold Amplitude: 0.625 V
Lead Channel Pacing Threshold Amplitude: 1.375 V
Lead Channel Pacing Threshold Pulse Width: 0.4 ms
Lead Channel Pacing Threshold Pulse Width: 0.4 ms
Lead Channel Pacing Threshold Pulse Width: 0.8 ms
Lead Channel Sensing Intrinsic Amplitude: 2.875 mV
Lead Channel Sensing Intrinsic Amplitude: 4.25 mV
Lead Channel Sensing Intrinsic Amplitude: 4.25 mV
Lead Channel Setting Pacing Amplitude: 1.5 V
Lead Channel Setting Pacing Amplitude: 2 V
Lead Channel Setting Pacing Amplitude: 2.5 V
Lead Channel Setting Pacing Pulse Width: 0.4 ms
Lead Channel Setting Pacing Pulse Width: 0.8 ms
Lead Channel Setting Sensing Sensitivity: 0.3 mV
MDC IDC MSMT LEADCHNL LV IMPEDANCE VALUE: 304 Ohm
MDC IDC MSMT LEADCHNL RA SENSING INTR AMPL: 2.875 mV
MDC IDC MSMT LEADCHNL RV PACING THRESHOLD AMPLITUDE: 0.625 V
MDC IDC STAT BRADY AS VP PERCENT: 12.28 %

## 2018-11-14 ENCOUNTER — Other Ambulatory Visit: Payer: Self-pay

## 2018-11-14 ENCOUNTER — Ambulatory Visit (HOSPITAL_COMMUNITY)
Admission: RE | Admit: 2018-11-14 | Discharge: 2018-11-14 | Disposition: A | Discharge status: Home / Self Care | Payer: Medicare Other | Source: Ambulatory Visit | Attending: Cardiology | Admitting: Cardiology

## 2018-11-14 DIAGNOSIS — I5022 Chronic systolic (congestive) heart failure: Secondary | ICD-10-CM | POA: Insufficient documentation

## 2018-11-14 LAB — BASIC METABOLIC PANEL
Anion gap: 7 (ref 5–15)
BUN: 61 mg/dL — ABNORMAL HIGH (ref 8–23)
CO2: 24 mmol/L (ref 22–32)
CREATININE: 1.99 mg/dL — AB (ref 0.61–1.24)
Calcium: 9.1 mg/dL (ref 8.9–10.3)
Chloride: 102 mmol/L (ref 98–111)
GFR calc Af Amer: 34 mL/min — ABNORMAL LOW (ref >60)
GFR, EST NON AFRICAN AMERICAN: 29 mL/min — AB (ref >60)
Glucose, Bld: 278 mg/dL — ABNORMAL HIGH (ref 70–99)
Potassium: 4.1 mmol/L (ref 3.5–5.1)
Sodium: 133 mmol/L — ABNORMAL LOW (ref 135–145)

## 2018-11-15 ENCOUNTER — Telehealth (HOSPITAL_COMMUNITY): Payer: Self-pay | Admitting: Cardiology

## 2018-11-15 DIAGNOSIS — I5022 Chronic systolic (congestive) heart failure: Secondary | ICD-10-CM

## 2018-11-15 MED ORDER — TORSEMIDE 20 MG PO TABS: 40.0000 mg | ORAL_TABLET | ORAL | 0 refills | Status: DC

## 2018-11-15 NOTE — Telephone Encounter (Signed)
-----   Message from Shirley Friar, PA-C sent at 11/15/2018 10:45 AM EDT ----- Please hold torsemide for 2 doses, and then decrease to 40 mg every other day with extra as needed. (So none tomorrow, or Saturday, restart on Sunday)  Needs repeat BMET next week.

## 2018-11-15 NOTE — Telephone Encounter (Signed)
Notes recorded by Kerry Dory, CMA on 11/15/2018 at 4:30 PM EDT Pt aware via caregiver, voiced understanding, repeat labs 3/19 ------  Notes recorded by Shirley Friar, PA-C on 11/15/2018 at 10:45 AM EDT Please hold torsemide for 2 doses, and then decrease to 40 mg every other day with extra as needed. (So none tomorrow, or Saturday, restart on Sunday) Needs repeat BMET next week.

## 2018-11-16 NOTE — Progress Notes (Signed)
Remote ICD transmission.   

## 2018-11-19 ENCOUNTER — Encounter: Payer: Self-pay | Admitting: Cardiology

## 2018-11-19 ENCOUNTER — Other Ambulatory Visit: Payer: Self-pay

## 2018-11-19 ENCOUNTER — Ambulatory Visit (INDEPENDENT_AMBULATORY_CARE_PROVIDER_SITE_OTHER): Payer: Medicare Other

## 2018-11-19 DIAGNOSIS — Z95810 Presence of automatic (implantable) cardiac defibrillator: Secondary | ICD-10-CM | POA: Diagnosis not present

## 2018-11-19 DIAGNOSIS — I5022 Chronic systolic (congestive) heart failure: Secondary | ICD-10-CM

## 2018-11-21 NOTE — Progress Notes (Signed)
EPIC Encounter for ICM Monitoring  Patient Name: [name] is a 85 y.o. male Date: 11/21/2018 Primary Care Physican: [name], MD Primary Cardiologist:Harwani/McLean  Electrophysiologist: [name] 3/18/2020Weight:152 lbs Bi-V Pacing: 85.6%   Spoke with friend, [name]. She said patient is doing fine.  No fluid symptoms at this time  Report: Thoracic impedance has returned close to baseline normal.    Prescribed:Torsemide20 mgtake 2 tablets (40 mg total) every other day (decreased 3/12 after lab results). Potassium 20 mEq take 1 tablet daily.  Labs: Repeat BMET scheduled 11/23/2018. 11/14/2018 Creatinine 1.99, BUN 61, Potassium 4.1, Sodium 133, GFR 29-34 10/25/2018 Creatinine 1.87, BUN 42, Potassium 4.4, Sodium 132, GFR 31-36 10/11/2018 Creatinine 1.53, BUN 26, Potassium 3.2, Sodium 137, GFR 40-46 09/26/2018 Creatinine 1.59, BUN 31, Potassium 3.6, Sodium 134, GFR 38-44 08/31/2018 Creatinine1.79, BUN31, Potassium3.8, Sodium132, GFR33-38 08/24/2018 Creatinine2.05, BUN41, Potassium4.3, Sodium131, [id number] 08/21/2018 Creatinine1.97, BUN37, Potassium5.7, Sodium132, GFR 29-34 08/03/2018 Creatinine1.44, BUN14, Potassium3.6, Sodium135, GFR43-50 08/01/2018 Creatinine1.58, BUN28, Potassium3.1, Sodium132, [id number]  07/03/2018 Creatinine1.62, BUN26, Potassium4.0, Sodium139, [id number]  A complete set of results can be found in Results Review.  Recommendations:No changes and encouraged to call if experiencing any fluid symptoms.  Follow-up plan: ICM clinic phone appointment on4/20/2020.   Office appt 01/24/2019 with Dr. Aundra Dubin.    Copy of ICM check sent to Dr. [name].   3 month ICM trend: 11/19/2018    1 Year ICM trend:       [name], RN 11/21/2018 10:10 AM

## 2018-11-22 ENCOUNTER — Other Ambulatory Visit (HOSPITAL_COMMUNITY): Payer: Medicare Other

## 2018-11-23 ENCOUNTER — Ambulatory Visit (HOSPITAL_COMMUNITY)
Admission: RE | Admit: 2018-11-23 | Discharge: 2018-11-23 | Disposition: A | Discharge status: Home / Self Care | Payer: Medicare Other | Source: Ambulatory Visit | Attending: Cardiology | Admitting: Cardiology

## 2018-11-23 ENCOUNTER — Other Ambulatory Visit: Payer: Self-pay

## 2018-11-23 DIAGNOSIS — I5022 Chronic systolic (congestive) heart failure: Secondary | ICD-10-CM | POA: Insufficient documentation

## 2018-11-23 LAB — BASIC METABOLIC PANEL
Anion gap: 10 (ref 5–15)
BUN: 40 mg/dL — ABNORMAL HIGH (ref 8–23)
CO2: 22 mmol/L (ref 22–32)
Calcium: 9.1 mg/dL (ref 8.9–10.3)
Chloride: 105 mmol/L (ref 98–111)
Creatinine, Ser: 1.85 mg/dL — ABNORMAL HIGH (ref 0.61–1.24)
GFR calc Af Amer: 37 mL/min — ABNORMAL LOW (ref >60)
GFR calc non Af Amer: 32 mL/min — ABNORMAL LOW (ref >60)
Glucose, Bld: 224 mg/dL — ABNORMAL HIGH (ref 70–99)
Potassium: 4 mmol/L (ref 3.5–5.1)
Sodium: 137 mmol/L (ref 135–145)

## 2018-12-24 ENCOUNTER — Other Ambulatory Visit: Payer: Self-pay

## 2018-12-24 ENCOUNTER — Ambulatory Visit (INDEPENDENT_AMBULATORY_CARE_PROVIDER_SITE_OTHER): Payer: Medicare (Managed Care)

## 2018-12-24 DIAGNOSIS — I5022 Chronic systolic (congestive) heart failure: Secondary | ICD-10-CM | POA: Diagnosis not present

## 2018-12-24 DIAGNOSIS — Z95810 Presence of automatic (implantable) cardiac defibrillator: Secondary | ICD-10-CM

## 2018-12-26 MED ORDER — TORSEMIDE 20 MG PO TABS: ORAL_TABLET | ORAL | 2 refills | Status: DC

## 2018-12-26 NOTE — Progress Notes (Signed)
Call to friend [name].  Advised Dr Aundra Dubin increased Torsemide to 40 mg daily x 3 days then 40 mg every other day alternating with 20 mg every other day.  BMET scheduled for 01/07/2019.  She reports patient is under [facility] program and they order all his meds.

## 2018-12-26 NOTE — Progress Notes (Signed)
EPIC Encounter for ICM Monitoring  Patient Name: [name] is a 85 y.o. male Date: 12/26/2018 Primary Care Physican: [name], MD Primary Cardiologist:Harwani/McLean  Electrophysiologist: Santina Evans Pacing: 90.8% 3/18/2020Weight:152 lbs 12/26/2018 Weight: 153 lbs   Clinical Status (19-Nov-2018 to 24-Dec-2018)  Time in AT/AF <0.1 hr/day (<0.1%)  Longest AT/AF 3 minutes   Spoke with friend, [name]. Pt asymptomatic for fluid symptoms.  He fell yesterday, 12/25/2018 and did not have any injuries.  Patient has meals on wheels and which does not offer low salt meals.  He has a neighbor that is CNA that is assisting patient with ADL's.   Optivol Thoracic impedancehas down suggestive of fluid accumulation.      Prescribed:Torsemide20 mgtake 2 tablets (40 mgtotal) every other day (decreased 3/12 after lab results). Potassium 20 mEq take 1 tablet daily.  Labs: 11/23/2018 Creatinine 1.85, BUN 40, Potassium 4.0, Sodium 137, GFR 32-37 11/14/2018 Creatinine 1.99, BUN 61, Potassium 4.1, Sodium 133, GFR 29-34 10/25/2018 Creatinine 1.87, BUN 42, Potassium 4.4, Sodium 132, GFR 31-36 10/11/2018 Creatinine 1.53, BUN 26, Potassium 3.2, Sodium 137, GFR 40-46 09/26/2018 Creatinine 1.59, BUN 31, Potassium 3.6, Sodium 134, GFR 38-44 08/31/2018 Creatinine1.79, BUN31, Potassium3.8, Sodium132, GFR33-38 08/24/2018 Creatinine2.05, BUN41, Potassium4.3, Sodium131, [id number] 08/21/2018 Creatinine1.97, BUN37, Potassium5.7, Sodium132, GFR 29-34 08/03/2018 Creatinine1.44, BUN14, Potassium3.6, Sodium135, GFR43-50 08/01/2018 Creatinine1.58, BUN28, Potassium3.1, Sodium132, [id number]  07/03/2018 Creatinine1.62, BUN26, Potassium4.0, Sodium139, [id number]  A complete set of results can be found in Results Review.  Recommendations:Advised will send to Dr Aundra Dubin for review and recommendations if needed.  Follow-up plan: ICM clinic phone  appointment on4/28/2020 to recheck fluid levels.   Office appt 01/24/2019 with Dr. Aundra Dubin.    Copy of ICM check sent to Dr. [name] and Dr Aundra Dubin.   3 month ICM trend: 12/24/2018    1 Year ICM trend:       [name], RN 12/26/2018 9:39 AM

## 2018-12-26 NOTE — Progress Notes (Signed)
Take torsemide 40 mg daily x 3 days then 40 daily alternating with 20 daily.  BMET 10 days.

## 2019-01-01 ENCOUNTER — Other Ambulatory Visit: Payer: Self-pay

## 2019-01-01 ENCOUNTER — Ambulatory Visit (INDEPENDENT_AMBULATORY_CARE_PROVIDER_SITE_OTHER): Payer: Medicaid Other

## 2019-01-01 DIAGNOSIS — I5022 Chronic systolic (congestive) heart failure: Secondary | ICD-10-CM

## 2019-01-01 DIAGNOSIS — Z95810 Presence of automatic (implantable) cardiac defibrillator: Secondary | ICD-10-CM

## 2019-01-02 NOTE — Progress Notes (Signed)
EPIC Encounter for ICM Monitoring  Patient Name: [name] is a 85 y.o. male Date: 01/02/2019 Primary Care Physican: [name], MD Primary Cardiologist:Harwani/McLean  Electrophysiologist: Santina Evans Pacing: 90.8% 3/18/2020Weight:152 lbs 12/26/2018 Weight: 153 lbs    Spoke withfriend, [name]. Pt's leg swelling has resolved.  Patient is in the [facility] program that manages his meds and labs.  She said [facility] called HF clinic and changed BMET date from 5/4 to 4/30.  Advise I will cancel the HF clinic appt for 5/4.   He has a neighbor that is CNA that is assisting patient with ADL's.   Optivol Thoracic impedancehas returned to normal since Torsemide dosage increase.    Prescribed:Torsemide20 mgtake 2 tablets (40 mgtotal) every other day alternating with 20 mg every other day. Potassium 20 mEq take 1 tablet daily.  Labs:BMET scheduled for 01/03/2019 by [facility] program 11/23/2018 Creatinine 1.85, BUN 40, Potassium 4.0, Sodium 137, GFR 32-37 11/14/2018 Creatinine 1.99, BUN 61, Potassium 4.1, Sodium 133, GFR 29-34 10/25/2018 Creatinine 1.87, BUN 42, Potassium 4.4, Sodium 132, GFR 31-36 10/11/2018 Creatinine 1.53, BUN 26, Potassium 3.2, Sodium 137, GFR 40-46 09/26/2018 Creatinine 1.59, BUN 31, Potassium 3.6, Sodium 134, GFR 38-44 08/31/2018 Creatinine1.79, BUN31, Potassium3.8, Sodium132, [id number] 08/24/2018 Creatinine2.05, BUN41, Potassium4.3, Sodium131, [id number] 08/21/2018 Creatinine1.97, BUN37, Potassium5.7, Sodium132, GFR 29-34 08/03/2018 Creatinine1.44, BUN14, Potassium3.6, Sodium135, GFR43-50 08/01/2018 Creatinine1.58, BUN28, Potassium3.1, Sodium132, [id number]  07/03/2018 Creatinine1.62, BUN26, Potassium4.0, Sodium139, [id number]  A complete set of results can be found in Results Review.  Recommendations: No changes today.  Follow-up plan: ICM clinic phone appointment on6/11/2018 to recheck fluid levels. Office appt  01/24/2019 with Dr.McLean.   Copy of ICM check sent to Dr.Taylor and Dr Aundra Dubin to show effectiveness of Torsemide dosage increase.   3 month ICM trend: 01/01/2019    1 Year ICM trend:       [name], RN 01/02/2019 3:48 PM

## 2019-01-04 ENCOUNTER — Telehealth: Payer: Self-pay

## 2019-01-04 NOTE — Telephone Encounter (Signed)
Received call from patients friend [name], [place].  She provided the contact information for [facility] program.  The phone number is [phone] and Linda Hedges is his Nurse Practitioner.  She said to let [facility] know if any changes in his plan of care is needed.  She also remains the contact person for patient.

## 2019-01-07 ENCOUNTER — Other Ambulatory Visit (HOSPITAL_COMMUNITY): Payer: Medicare Other

## 2019-01-15 ENCOUNTER — Other Ambulatory Visit (HOSPITAL_COMMUNITY): Payer: Self-pay | Admitting: Cardiology

## 2019-01-24 ENCOUNTER — Encounter (HOSPITAL_COMMUNITY): Payer: Medicare Other | Admitting: Cardiology

## 2019-02-06 ENCOUNTER — Ambulatory Visit (INDEPENDENT_AMBULATORY_CARE_PROVIDER_SITE_OTHER): Payer: Medicare (Managed Care)

## 2019-02-06 ENCOUNTER — Ambulatory Visit (HOSPITAL_COMMUNITY)
Admission: RE | Admit: 2019-02-06 | Discharge: 2019-02-06 | Disposition: A | Discharge status: Home / Self Care | Payer: Medicaid Other | Source: Ambulatory Visit | Attending: Cardiology | Admitting: Cardiology

## 2019-02-06 ENCOUNTER — Encounter (HOSPITAL_COMMUNITY): Payer: Self-pay

## 2019-02-06 ENCOUNTER — Other Ambulatory Visit: Payer: Self-pay

## 2019-02-06 VITALS — Wt 154.3 lb

## 2019-02-06 DIAGNOSIS — I5022 Chronic systolic (congestive) heart failure: Secondary | ICD-10-CM | POA: Diagnosis not present

## 2019-02-06 DIAGNOSIS — I255 Ischemic cardiomyopathy: Secondary | ICD-10-CM

## 2019-02-06 DIAGNOSIS — Z95810 Presence of automatic (implantable) cardiac defibrillator: Secondary | ICD-10-CM

## 2019-02-06 NOTE — Progress Notes (Signed)
Conrad Hurdsfield, NP sent to Harvie Junior, CMA        F/U 6 months with Dr Aundra Dubin       AVS mailed pt aware to call our office to schedule 6 month follow up.

## 2019-02-06 NOTE — Addendum Note (Signed)
Encounter addended by: Harvie Junior, CMA on: 02/06/2019 3:51 PM  Actions taken: Clinical Note Signed

## 2019-02-06 NOTE — Patient Instructions (Signed)
Follow up in 6 months with Dr.McLean.  **Please call our office at [phone] in October to schedule your December appointment**

## 2019-02-06 NOTE — Progress Notes (Signed)
Heart Failure TeleHealth Note  Due to national recommendations of social distancing due to [place] 19, Audio/video telehealth visit is felt to be most appropriate for this patient at this time.  See MyChart message from today for patient consent regarding telehealth for [facility].  Date:  02/06/2019   ID:  [name], DOB 12-21-1926, MRN [id number]  Location: Home  Provider location: [place] [facility] Failure Type of Visit: Established patient   PCP:  [name], MD  Cardiologist:  No primary care provider on file. Primary HF: Dr Aundra Dubin  [facility]  Chief Complaint: heart Failure   History of Present Illness: [name] is a 85 y.o. male with a history of CAD s/p CABG and chronic systolic CHF from ischemic cardiomyopathy presents for HF clinic evaluation.  He has a Medtronic CRT-D device.  Echo in 11/15 showed EF 20-25%. He had VT x 2 in 9/17 terminated by ATP.   Admitted from clinic 06/10/16 with orthostasis and 24 lb weight loss after switch from Lasix to torsemide. Optivol with fluid index well below threshold and impedance continuing up. With orthostasis, light headedness, and living alone pt was admitted for dehydration. AKI noted on labwork. Diuretics held on admission and given IV fluid. Entresto stopped. Dizziness/orthostasis improved. Torsemide resumed 06/13/16, then cut back further on 10/10 and again on 06/15/16 for uptrend in BUN. Repeat Echo (10/17) showed EF 35-40%, mildly dilated RV with mild to moderately decreased systolic function, severe TR, PASP 48 mmHg.   Echo in 1/19 showed  EF 35% with wall motion abnormalities, mildly decreased RV systolic function, moderate TR.   He was admitted in 10/19 with ventral incisional hernia with incarcerated small bowel with ischemia.  He had resection of small bowel in the OR.    He  presents via Psychiatric nurse for a telehealth visit today.  Followed by [facility] now. Overall feeling fine. Says he is having a  good day.  Denies PND/Orthopnea. Appetite ok. No fever or chills. Weight at home 154  pounds. Taking all medications.  he denies symptoms worrisome for COVID 19.   Past Medical History:  Diagnosis Date  . AICD (automatic cardioverter/defibrillator) present   . Arthritis    "shoulders" (10/31/2014)  . CAD (coronary artery disease)    s/p CABG; s/p Pacemaker  . Diverticulosis of colon   . GERD (gastroesophageal reflux disease)   . Gout   . Hyperlipidemia   . Hypertension   . Ischemic cardiomyopathy    s/p ICD  . On home oxygen therapy    "2L prn" (10/31/2014)  . Paroxysmal ventricular tachycardia ([place])   . SBO (small bowel obstruction) ([place]) 10/31/2014  . Shortness of breath dyspnea    with exertion  . Systolic CHF with reduced left ventricular function, NYHA class 2 ([place])    Past Surgical History:  Procedure Laterality Date  . BI-VENTRICULAR IMPLANTABLE CARDIOVERTER DEFIBRILLATOR UPGRADE N/A 01/22/2014   Procedure: BI-VENTRICULAR IMPLANTABLE CARDIOVERTER DEFIBRILLATOR UPGRADE;  Surgeon: Evans Lance, MD;  Location: [facility] CATH LAB;  Service: Cardiovascular;  Laterality: N/A;  . BOWEL RESECTION    . CARDIAC CATHETERIZATION  03/2011   Archie Endo 01/18/2011  . CARDIAC DEFIBRILLATOR PLACEMENT  07/2003   Archie Endo 01/18/2011  . CATARACT EXTRACTION W/ INTRAOCULAR LENS  IMPLANT, BILATERAL Bilateral   . CATARACT EXTRACTION W/PHACO Right 12/17/2014   Procedure: PHACOEMULSIFICATION CATARACT EXTRACTION WITH IOL IMPLANT RIGHT EYE;  Surgeon: Marylynn Pearson, MD;  Location: [place];  Service: Ophthalmology;  Laterality: Right;  .  CHOLECYSTECTOMY    . CORONARY ANGIOPLASTY WITH STENT PLACEMENT  04/2011   2 stents/notes 05/03/2011  . CORONARY ARTERY BYPASS GRAFT  03/2003   CABG X3/notes 01/18/2011  . ESOPHAGOGASTRODUODENOSCOPY (EGD) WITH PROPOFOL N/A 06/29/2018   Procedure: ESOPHAGOGASTRODUODENOSCOPY (EGD) WITH PROPOFOL;  Surgeon: Wilford Corner, MD;  Location: [facility] ENDOSCOPY;  Service: Endoscopy;  Laterality: N/A;   . EYE SURGERY Bilateral    [place]  . IMPLANTABLE CARDIOVERTER DEFIBRILLATOR (ICD) GENERATOR CHANGE Left 10/12/2011   Procedure: ICD GENERATOR CHANGE;  Surgeon: Evans Lance, MD;  Location: [facility] CATH LAB;  Service: Cardiovascular;  Laterality: Left;  . LAPAROTOMY N/A 06/26/2018   Procedure: EXPLORATORY LAPAROTOMY SMALL BOWEL RESECTION X 2;  Surgeon: Ileana Roup, MD;  Location: [facility] ORS;  Service: General;  Laterality: N/A;  . PACEMAKER PLACEMENT    . PACEMAKER REVISION N/A 10/12/2011   Procedure: PACEMAKER REVISION;  Surgeon: Evans Lance, MD;  Location: [facility] CATH LAB;  Service: Cardiovascular;  Laterality: N/A;  . TEE WITH CARDIOVERSION  03/2003   Archie Endo 01/18/2011  . TONSILLECTOMY       Current Outpatient Medications  Medication Sig Dispense Refill  . acetaminophen (TYLENOL) 325 MG tablet Take 2 tablets (650 mg total) by mouth every 6 (six) hours as needed for mild pain or fever.    Marland Kitchen allopurinol (ZYLOPRIM) 300 MG tablet Take 300 mg by mouth daily.      Marland Kitchen aspirin (RA ASPIRIN ADULT LOW STRENGTH) 81 MG EC tablet Take 1 tablet (81 mg total) by mouth daily.  0  . atorvastatin (LIPITOR) 40 MG tablet GIVE 1 TABLET BY MOUTH EVERY EVENING 30 tablet 3  . carvedilol (COREG) 6.25 MG tablet Take 1.5 tablets (9.375 mg total) by mouth 2 (two) times daily with a meal. 90 tablet 11  . chlorhexidine (PERIDEX) 0.12 % solution 15 mLs by Mouth Rinse route 2 (two) times daily. 120 mL 0  . escitalopram (LEXAPRO) 10 MG tablet Take 10 mg by mouth daily.    Marland Kitchen esomeprazole (NEXIUM) 20 MG capsule Take 20 mg by mouth daily.    . finasteride (PROSCAR) 5 MG tablet Take 5 mg by mouth every evening.    Marland Kitchen ketotifen (ZADITOR) 0.025 % ophthalmic solution Place 1 drop into both eyes daily as needed (dry eyes).     . Lactobacillus (ACIDOPHILUS PROBIOTIC PO) Take 1 tablet by mouth 2 (two) times daily.    Marland Kitchen LORazepam (ATIVAN) 0.5 MG tablet Take 1 tablet (0.5 mg total) by mouth 2 (two) times daily as needed for anxiety.  28 tablet 0  . NITROSTAT 0.4 MG SL tablet Place 0.4 mg under the tongue every 5 (five) minutes as needed for chest pain (MAX 3 TABLETS).     Marland Kitchen olopatadine (PATANOL) 0.1 % ophthalmic solution Place 1 drop into both eyes 2 (two) times daily.    . potassium chloride SA (K-DUR,KLOR-CON) 20 MEQ tablet Take 20 mEq by mouth daily.    Marland Kitchen spironolactone (ALDACTONE) 25 MG tablet Take 25 mg by mouth daily.    Marland Kitchen torsemide (DEMADEX) 20 MG tablet Take 2 tablets (40 mg total) every other day alternating with 1 tablet (20 mg total) every other day. 135 tablet 2   No current facility-administered medications for this encounter.     Allergies:   Patient has no known allergies.   Social History:  The patient  reports that he has never smoked. He has quit using smokeless tobacco.  His smokeless tobacco use included chew. He reports current alcohol use.  He reports that he does not use drugs.   Family History:  The patient's family history includes Diabetes in his mother; Heart Problems in his brother and mother.   ROS:  Please see the history of present illness.   All other systems are personally reviewed and negative.   Exam:  Tele Health Call; Exam is subjective  General:  Speaks in full sentences. No resp difficulty. Lungs: Normal respiratory effort with conversation.  Abdomen: Non-distended per patient report Extremities: Pt denies edema. Neuro: Alert & oriented x 3.   Recent Labs: 06/26/2018: B Natriuretic Peptide 433.0 07/03/2018: Magnesium 1.8 08/01/2018: ALT 18 08/03/2018: Hemoglobin 10.4; Platelets 151 11/23/2018: BUN 40; Creatinine, Ser 1.85; Potassium 4.0; Sodium 137  Personally reviewed   Wt Readings from Last 3 Encounters:  02/06/19 70 kg (154 lb 4.8 oz)  10/25/18 68 kg (150 lb)  10/17/18 69.6 kg (153 lb 6.4 oz)      ASSESSMENT AND PLAN:  1. Chronic systolic CHF: Ischemic cardiomyopathy.  He has a Medtronic CRT-D device.  Echo in 1/19 showed EF 35% which is stable.   NYHA III. Volume  status sounds stable. Continue current dose of torsemide.   - He has failed Entresto with marked hypotension and is off lisinopril with rise in creatinine.  -Continue spironolactone 25 mg daily.   - Continue Coreg 9.375 mg bid.   2. CAD: s/p CABG.   -No chest pain.    - Continue statin and ASA 81 daily.   3. VT: ATP 9/17 was successful.   - Quiescent. Following with EP.  4. Tricuspid regurgitation: Moderate on 1/19 echo.  No change.  5. CKD III   COVID screen The patient does not have any symptoms that suggest any further testing/ screening at this time.  Social distancing reinforced today.  Patient Risk: After full review of this patients clinical status, I feel that they are at moderate risk for cardiac decompensation at this time.  Relevant cardiac medications were reviewed at length with the patient today. The patient does not have concerns regarding their medications at this time.   The following changes were made today:  No changes  Recommended follow-up:  6 months with Dr Aundra Dubin.   Today, I have spent 11 minutes with the patient with telehealth technology discussing the above issues .    Jeanmarie Hubert, NP  02/06/2019 3:29 PM  [place] [address] [facility] [place] [address] [phone] (office) [phone] (fax)

## 2019-02-07 ENCOUNTER — Ambulatory Visit (INDEPENDENT_AMBULATORY_CARE_PROVIDER_SITE_OTHER): Payer: Medicare (Managed Care) | Admitting: *Deleted

## 2019-02-07 DIAGNOSIS — I5022 Chronic systolic (congestive) heart failure: Secondary | ICD-10-CM

## 2019-02-07 DIAGNOSIS — I255 Ischemic cardiomyopathy: Secondary | ICD-10-CM

## 2019-02-07 LAB — CUP PACEART REMOTE DEVICE CHECK
Battery Remaining Longevity: 18 mo
Battery Voltage: 2.9 V
Brady Statistic AP VP Percent: 87.9 %
Brady Statistic AP VS Percent: 2.12 %
Brady Statistic AS VP Percent: 9.36 %
Brady Statistic AS VS Percent: 0.62 %
Brady Statistic RA Percent Paced: 88.02 %
Brady Statistic RV Percent Paced: 94.64 %
Date Time Interrogation Session: 20200604094345
HighPow Impedance: 37 Ohm
HighPow Impedance: 46 Ohm
Implantable Lead Implant Date: 20040709
Implantable Lead Implant Date: 20041111
Implantable Lead Implant Date: 20130206
Implantable Lead Implant Date: 20150520
Implantable Lead Location: 753858
Implantable Lead Location: 753859
Implantable Lead Location: 753860
Implantable Lead Location: 753860
Implantable Lead Model: 5071
Implantable Lead Model: 5076
Implantable Lead Model: 5076
Implantable Lead Model: 6947
Implantable Pulse Generator Implant Date: 20150520
Lead Channel Impedance Value: 285 Ohm
Lead Channel Impedance Value: 304 Ohm
Lead Channel Impedance Value: 4047 Ohm
Lead Channel Impedance Value: 4047 Ohm
Lead Channel Impedance Value: 418 Ohm
Lead Channel Impedance Value: 456 Ohm
Lead Channel Pacing Threshold Amplitude: 0.625 V
Lead Channel Pacing Threshold Amplitude: 0.75 V
Lead Channel Pacing Threshold Amplitude: 1.625 V
Lead Channel Pacing Threshold Pulse Width: 0.4 ms
Lead Channel Pacing Threshold Pulse Width: 0.4 ms
Lead Channel Pacing Threshold Pulse Width: 0.8 ms
Lead Channel Sensing Intrinsic Amplitude: 2.75 mV
Lead Channel Sensing Intrinsic Amplitude: 6.625 mV
Lead Channel Setting Pacing Amplitude: 1.5 V
Lead Channel Setting Pacing Amplitude: 2 V
Lead Channel Setting Pacing Amplitude: 2.5 V
Lead Channel Setting Pacing Pulse Width: 0.4 ms
Lead Channel Setting Pacing Pulse Width: 0.8 ms
Lead Channel Setting Sensing Sensitivity: 0.3 mV

## 2019-02-08 NOTE — Progress Notes (Signed)
EPIC Encounter for ICM Monitoring  Patient Name: [name] is a 85 y.o. male Date: 02/08/2019 Primary Care Physican: [name], MD Primary Cardiologist:Harwani/McLean  Electrophysiologist: Santina Evans Pacing: 90.8% 3/18/2020Weight:152 lbs 12/26/2018 Weight:153lbs    Attempted call to friend [name] and left detailed message to return call. Patient is in the PACE program that manages his meds and labs. He has a neighbor that is CNA that is assisting patient with ADL's.   OptivolThoracic impedance is trending toward baseline normal.  Prescribed:Torsemide20 mgtake 2 tablets (40 mgtotal) every other day alternating with 20 mg every other day. Potassium 20 mEq take 1 tablet daily.  Labs: 01/03/2019 Creatinine 1.74, BUN 49, Potassium 4.4, Sodium 136, GFR 34-39 11/23/2018 Creatinine 1.85, BUN 40, Potassium 4.0, Sodium 137, GFR 32-37 11/14/2018 Creatinine 1.99, BUN 61, Potassium 4.1, Sodium 133, GFR 29-34 10/25/2018 Creatinine 1.87, BUN 42, Potassium 4.4, Sodium 132, GFR 31-36 10/11/2018 Creatinine 1.53, BUN 26, Potassium 3.2, Sodium 137, GFR 40-46 09/26/2018 Creatinine 1.59, BUN 31, Potassium 3.6, Sodium 134, GFR 38-44 08/31/2018 Creatinine1.79, BUN31, Potassium3.8, Sodium132, [id number] 08/24/2018 Creatinine2.05, BUN41, Potassium4.3, Sodium131, [id number] 08/21/2018 Creatinine1.97, BUN37, Potassium5.7, Sodium132, GFR 29-34 08/03/2018 Creatinine1.44, BUN14, Potassium3.6, Sodium135, GFR43-50 08/01/2018 Creatinine1.58, BUN28, Potassium3.1, Sodium132, PPI95-18  07/03/2018 Creatinine1.62, BUN26, Potassium4.0, Sodium139, [id number]  A complete set of results can be found in Results Review.  Recommendations: None  Follow-up plan: ICM clinic phone appointment on7/02/2019.   Copy of ICM check sent to Dr.Taylor.  3 month ICM trend: 02/07/2019    1 Year ICM trend:       [name], RN 02/08/2019 2:39 PM

## 2019-02-14 NOTE — Progress Notes (Signed)
Remote ICD transmission.   

## 2019-03-11 ENCOUNTER — Ambulatory Visit (INDEPENDENT_AMBULATORY_CARE_PROVIDER_SITE_OTHER): Payer: Medicare (Managed Care)

## 2019-03-11 DIAGNOSIS — Z95810 Presence of automatic (implantable) cardiac defibrillator: Secondary | ICD-10-CM | POA: Diagnosis not present

## 2019-03-11 DIAGNOSIS — I5022 Chronic systolic (congestive) heart failure: Secondary | ICD-10-CM

## 2019-03-11 LAB — CUP PACEART REMOTE DEVICE CHECK
Battery Remaining Longevity: 17 mo
Battery Voltage: 2.9 V
Brady Statistic AP VP Percent: 79.91 %
Brady Statistic AP VS Percent: 0.53 %
Brady Statistic AS VP Percent: 19.12 %
Brady Statistic AS VS Percent: 0.43 %
Brady Statistic RA Percent Paced: 78.4 %
Brady Statistic RV Percent Paced: 96.17 %
Date Time Interrogation Session: 20200706041705
HighPow Impedance: 38 Ohm
HighPow Impedance: 49 Ohm
Implantable Lead Implant Date: 20040709
Implantable Lead Implant Date: 20041111
Implantable Lead Implant Date: 20130206
Implantable Lead Implant Date: 20150520
Implantable Lead Location: 753858
Implantable Lead Location: 753859
Implantable Lead Location: 753860
Implantable Lead Location: 753860
Implantable Lead Model: 5071
Implantable Lead Model: 5076
Implantable Lead Model: 5076
Implantable Lead Model: 6947
Implantable Pulse Generator Implant Date: 20150520
Lead Channel Impedance Value: 304 Ohm
Lead Channel Impedance Value: 304 Ohm
Lead Channel Impedance Value: 4047 Ohm
Lead Channel Impedance Value: 4047 Ohm
Lead Channel Impedance Value: 418 Ohm
Lead Channel Impedance Value: 456 Ohm
Lead Channel Pacing Threshold Amplitude: 0.625 V
Lead Channel Pacing Threshold Amplitude: 0.75 V
Lead Channel Pacing Threshold Amplitude: 1.625 V
Lead Channel Pacing Threshold Pulse Width: 0.4 ms
Lead Channel Pacing Threshold Pulse Width: 0.4 ms
Lead Channel Pacing Threshold Pulse Width: 0.8 ms
Lead Channel Sensing Intrinsic Amplitude: 2.875 mV
Lead Channel Sensing Intrinsic Amplitude: 2.875 mV
Lead Channel Sensing Intrinsic Amplitude: 4.375 mV
Lead Channel Sensing Intrinsic Amplitude: 4.375 mV
Lead Channel Setting Pacing Amplitude: 1.5 V
Lead Channel Setting Pacing Amplitude: 2 V
Lead Channel Setting Pacing Amplitude: 2.5 V
Lead Channel Setting Pacing Pulse Width: 0.4 ms
Lead Channel Setting Pacing Pulse Width: 0.8 ms
Lead Channel Setting Sensing Sensitivity: 0.3 mV

## 2019-03-12 NOTE — Progress Notes (Signed)
EPIC Encounter for ICM Monitoring  Patient Name: [name] is a 85 y.o. male Date: 03/12/2019 Primary Care Physican: [name], MD Primary Cardiologist:Harwani/McLean  Electrophysiologist: Santina Evans Pacing: 96.2% 12/26/2018 Weight:153lbs    Attempted call to friend [name] and left detailed message to return call. Patient is in the PACE program that manages his meds and labs. He has a neighbor that is CNA that is assisting patient with ADL's.   OptivolThoracic impedance is trending slightly below baseline normal.  Prescribed:Torsemide20 mgtake 2 tablets (40 mgtotal) every other dayalternating with 20 mg every other day. Potassium 20 mEq take 1 tablet daily.  Labs: 01/03/2019 Creatinine 1.74, BUN 49, Potassium 4.4, Sodium 136, GFR 34-39 11/23/2018 Creatinine 1.85, BUN 40, Potassium 4.0, Sodium 137, GFR 32-37 11/14/2018 Creatinine 1.99, BUN 61, Potassium 4.1, Sodium 133, GFR 29-34 A complete set of results can be found in Results Review.  Recommendations: Left voice mail with ICM number and encouraged to call if experiencing any fluid symptoms.  Follow-up plan: ICM clinic phone appointment on8/06/2019.   Copy of ICM check sent to Dr.Taylor.   3 month ICM trend: 03/11/2019    1 Year ICM trend:       [name], RN 03/12/2019 3:35 PM

## 2019-04-15 ENCOUNTER — Ambulatory Visit (INDEPENDENT_AMBULATORY_CARE_PROVIDER_SITE_OTHER): Payer: Medicare (Managed Care)

## 2019-04-15 DIAGNOSIS — Z95810 Presence of automatic (implantable) cardiac defibrillator: Secondary | ICD-10-CM | POA: Diagnosis not present

## 2019-04-15 DIAGNOSIS — I5022 Chronic systolic (congestive) heart failure: Secondary | ICD-10-CM | POA: Diagnosis not present

## 2019-04-15 LAB — CUP PACEART REMOTE DEVICE CHECK
Battery Remaining Longevity: 16 mo
Battery Voltage: 2.89 V
Brady Statistic AP VP Percent: 79.37 %
Brady Statistic AP VS Percent: 0.44 %
Brady Statistic AS VP Percent: 19.7 %
Brady Statistic AS VS Percent: 0.49 %
Brady Statistic RA Percent Paced: 77.08 %
Brady Statistic RV Percent Paced: 95.51 %
Date Time Interrogation Session: 20200810052304
HighPow Impedance: 39 Ohm
HighPow Impedance: 53 Ohm
Implantable Lead Implant Date: 20040709
Implantable Lead Implant Date: 20041111
Implantable Lead Implant Date: 20130206
Implantable Lead Implant Date: 20150520
Implantable Lead Location: 753858
Implantable Lead Location: 753859
Implantable Lead Location: 753860
Implantable Lead Location: 753860
Implantable Lead Model: 5071
Implantable Lead Model: 5076
Implantable Lead Model: 5076
Implantable Lead Model: 6947
Implantable Pulse Generator Implant Date: 20150520
Lead Channel Impedance Value: 285 Ohm
Lead Channel Impedance Value: 342 Ohm
Lead Channel Impedance Value: 4047 Ohm
Lead Channel Impedance Value: 4047 Ohm
Lead Channel Impedance Value: 456 Ohm
Lead Channel Impedance Value: 456 Ohm
Lead Channel Pacing Threshold Amplitude: 0.625 V
Lead Channel Pacing Threshold Amplitude: 0.625 V
Lead Channel Pacing Threshold Amplitude: 1.375 V
Lead Channel Pacing Threshold Pulse Width: 0.4 ms
Lead Channel Pacing Threshold Pulse Width: 0.4 ms
Lead Channel Pacing Threshold Pulse Width: 0.8 ms
Lead Channel Sensing Intrinsic Amplitude: 2.75 mV
Lead Channel Sensing Intrinsic Amplitude: 2.75 mV
Lead Channel Sensing Intrinsic Amplitude: 7.375 mV
Lead Channel Sensing Intrinsic Amplitude: 7.375 mV
Lead Channel Setting Pacing Amplitude: 1.5 V
Lead Channel Setting Pacing Amplitude: 2 V
Lead Channel Setting Pacing Amplitude: 2.5 V
Lead Channel Setting Pacing Pulse Width: 0.4 ms
Lead Channel Setting Pacing Pulse Width: 0.8 ms
Lead Channel Setting Sensing Sensitivity: 0.3 mV

## 2019-04-16 NOTE — Progress Notes (Signed)
EPIC Encounter for ICM Monitoring  Patient Name: [name] is a 85 y.o. male Date: 04/16/2019 Primary Care Physican: [name], MD Primary Cardiologist:Harwani/McLean  Electrophysiologist: Santina Evans Pacing: 95.5% 12/26/2018 Weight:153lbs        Transmission reviewed. Patient is in the PACE program that manages his meds and labs. He has a neighbor that is CNA that is assisting patient with ADL's.   OptivolThoracic impedancenormal.  Prescribed:Torsemide20 mgtake 2 tablets (40 mgtotal) every other dayalternating with 20 mg every other day. Potassium 20 mEq take 1 tablet daily.  Labs: 01/03/2019 Creatinine 1.74, BUN 49, Potassium 4.4, Sodium 136, GFR 34-39 11/23/2018 Creatinine 1.85, BUN 40, Potassium 4.0, Sodium 137, GFR 32-37 11/14/2018 Creatinine 1.99, BUN 61, Potassium 4.1, Sodium 133, GFR 29-34 A complete set of results can be found in Results Review.  Recommendations:No changes.  Follow-up plan: ICM clinic phone appointment on10/19/2020.  Copy of ICM check sent to Dr.Taylor.   3 month ICM trend: 04/15/2019    1 Year ICM trend:       [name], RN 04/16/2019 2:20 PM

## 2019-05-09 ENCOUNTER — Ambulatory Visit (INDEPENDENT_AMBULATORY_CARE_PROVIDER_SITE_OTHER): Payer: Medicare (Managed Care) | Admitting: *Deleted

## 2019-05-09 DIAGNOSIS — I255 Ischemic cardiomyopathy: Secondary | ICD-10-CM | POA: Diagnosis not present

## 2019-05-09 LAB — CUP PACEART REMOTE DEVICE CHECK
Battery Remaining Longevity: 16 mo
Battery Voltage: 2.89 V
Brady Statistic AP VP Percent: 68.01 %
Brady Statistic AP VS Percent: 0.86 %
Brady Statistic AS VP Percent: 30.2 %
Brady Statistic AS VS Percent: 0.93 %
Brady Statistic RA Percent Paced: 66.06 %
Brady Statistic RV Percent Paced: 94.15 %
Date Time Interrogation Session: 20200903041806
HighPow Impedance: 38 Ohm
HighPow Impedance: 51 Ohm
Implantable Lead Implant Date: 20040709
Implantable Lead Implant Date: 20041111
Implantable Lead Implant Date: 20130206
Implantable Lead Implant Date: 20150520
Implantable Lead Location: 753858
Implantable Lead Location: 753859
Implantable Lead Location: 753860
Implantable Lead Location: 753860
Implantable Lead Model: 5071
Implantable Lead Model: 5076
Implantable Lead Model: 5076
Implantable Lead Model: 6947
Implantable Pulse Generator Implant Date: 20150520
Lead Channel Impedance Value: 285 Ohm
Lead Channel Impedance Value: 342 Ohm
Lead Channel Impedance Value: 4047 Ohm
Lead Channel Impedance Value: 4047 Ohm
Lead Channel Impedance Value: 418 Ohm
Lead Channel Impedance Value: 456 Ohm
Lead Channel Pacing Threshold Amplitude: 0.625 V
Lead Channel Pacing Threshold Amplitude: 0.625 V
Lead Channel Pacing Threshold Amplitude: 1.5 V
Lead Channel Pacing Threshold Pulse Width: 0.4 ms
Lead Channel Pacing Threshold Pulse Width: 0.4 ms
Lead Channel Pacing Threshold Pulse Width: 0.8 ms
Lead Channel Sensing Intrinsic Amplitude: 2.875 mV
Lead Channel Sensing Intrinsic Amplitude: 2.875 mV
Lead Channel Sensing Intrinsic Amplitude: 3.625 mV
Lead Channel Sensing Intrinsic Amplitude: 3.625 mV
Lead Channel Setting Pacing Amplitude: 1.5 V
Lead Channel Setting Pacing Amplitude: 2 V
Lead Channel Setting Pacing Amplitude: 2.5 V
Lead Channel Setting Pacing Pulse Width: 0.4 ms
Lead Channel Setting Pacing Pulse Width: 0.8 ms
Lead Channel Setting Sensing Sensitivity: 0.3 mV

## 2019-05-23 ENCOUNTER — Encounter: Payer: Self-pay | Admitting: Cardiology

## 2019-05-23 NOTE — Progress Notes (Signed)
Remote ICD transmission.   

## 2019-06-24 ENCOUNTER — Ambulatory Visit (INDEPENDENT_AMBULATORY_CARE_PROVIDER_SITE_OTHER): Payer: Medicare (Managed Care)

## 2019-06-24 DIAGNOSIS — Z95810 Presence of automatic (implantable) cardiac defibrillator: Secondary | ICD-10-CM

## 2019-06-24 DIAGNOSIS — I5022 Chronic systolic (congestive) heart failure: Secondary | ICD-10-CM | POA: Diagnosis not present

## 2019-06-24 LAB — CUP PACEART REMOTE DEVICE CHECK
Battery Remaining Longevity: 16 mo
Battery Voltage: 2.89 V
Brady Statistic AP VP Percent: 76.71 %
Brady Statistic AP VS Percent: 0.44 %
Brady Statistic AS VP Percent: 21.85 %
Brady Statistic AS VS Percent: 1 %
Brady Statistic RA Percent Paced: 73.74 %
Brady Statistic RV Percent Paced: 93.83 %
Date Time Interrogation Session: 20201019082059
HighPow Impedance: 38 Ohm
HighPow Impedance: 47 Ohm
Implantable Lead Implant Date: 20040709
Implantable Lead Implant Date: 20041111
Implantable Lead Implant Date: 20130206
Implantable Lead Implant Date: 20150520
Implantable Lead Location: 753858
Implantable Lead Location: 753859
Implantable Lead Location: 753860
Implantable Lead Location: 753860
Implantable Lead Model: 5071
Implantable Lead Model: 5076
Implantable Lead Model: 5076
Implantable Lead Model: 6947
Implantable Pulse Generator Implant Date: 20150520
Lead Channel Impedance Value: 285 Ohm
Lead Channel Impedance Value: 304 Ohm
Lead Channel Impedance Value: 4047 Ohm
Lead Channel Impedance Value: 4047 Ohm
Lead Channel Impedance Value: 418 Ohm
Lead Channel Impedance Value: 418 Ohm
Lead Channel Pacing Threshold Amplitude: 0.625 V
Lead Channel Pacing Threshold Amplitude: 0.75 V
Lead Channel Pacing Threshold Amplitude: 1.625 V
Lead Channel Pacing Threshold Pulse Width: 0.4 ms
Lead Channel Pacing Threshold Pulse Width: 0.4 ms
Lead Channel Pacing Threshold Pulse Width: 0.8 ms
Lead Channel Sensing Intrinsic Amplitude: 2.75 mV
Lead Channel Sensing Intrinsic Amplitude: 2.75 mV
Lead Channel Sensing Intrinsic Amplitude: 4.5 mV
Lead Channel Sensing Intrinsic Amplitude: 4.5 mV
Lead Channel Setting Pacing Amplitude: 1.5 V
Lead Channel Setting Pacing Amplitude: 2 V
Lead Channel Setting Pacing Amplitude: 2.5 V
Lead Channel Setting Pacing Pulse Width: 0.4 ms
Lead Channel Setting Pacing Pulse Width: 0.8 ms
Lead Channel Setting Sensing Sensitivity: 0.3 mV

## 2019-06-25 ENCOUNTER — Other Ambulatory Visit: Payer: Self-pay

## 2019-06-25 ENCOUNTER — Encounter: Payer: Self-pay | Admitting: Cardiovascular Disease

## 2019-06-25 ENCOUNTER — Ambulatory Visit (INDEPENDENT_AMBULATORY_CARE_PROVIDER_SITE_OTHER): Payer: Medicare (Managed Care) | Admitting: Cardiovascular Disease

## 2019-06-25 VITALS — BP 118/68 | HR 60 | Temp 98.5°F | Ht 64.0 in | Wt 153.0 lb

## 2019-06-25 DIAGNOSIS — Z008 Encounter for other general examination: Secondary | ICD-10-CM

## 2019-06-25 DIAGNOSIS — I255 Ischemic cardiomyopathy: Secondary | ICD-10-CM

## 2019-06-25 DIAGNOSIS — Z95810 Presence of automatic (implantable) cardiac defibrillator: Secondary | ICD-10-CM | POA: Diagnosis not present

## 2019-06-25 DIAGNOSIS — E785 Hyperlipidemia, unspecified: Secondary | ICD-10-CM | POA: Insufficient documentation

## 2019-06-25 DIAGNOSIS — Z951 Presence of aortocoronary bypass graft: Secondary | ICD-10-CM | POA: Diagnosis not present

## 2019-06-25 DIAGNOSIS — E782 Mixed hyperlipidemia: Secondary | ICD-10-CM

## 2019-06-25 NOTE — Patient Instructions (Addendum)
Medication Instructions:  Your physician recommends that you continue on your current medications as directed. Please refer to the Current Medication list given to you today.  If you need a refill on your cardiac medications before your next appointment, please call your pharmacy.   Lab work: none If you have labs (blood work) drawn today and your tests are completely normal, you will receive your results only by: Marland Kitchen MyChart Message (if you have MyChart) OR . A paper copy in the mail If you have any lab test that is abnormal or we need to change your treatment, we will call you to review the results.  Testing/Procedures: none  Follow-Up: At [facility], you and your health needs are our priority.  As part of our continuing mission to provide you with exceptional heart care, we have created designated Provider Care Teams.  These Care Teams include your primary Cardiologist (physician) and Advanced Practice Providers (APPs -  Physician Assistants and Nurse Practitioners) who all work together to provide you with the care you need, when you need it. . You may schedule a follow up appointment as needed. You may see Dr. Gwenlyn Found or one of the following Advanced Practice Providers on your designated Care Team:   . Kerin Ransom, PA-C . Daleen Snook Kroeger, PA-C . Sande Rives, PA-C   Any Other Special Instructions Will Be Listed Below (If Applicable).  PLEASE SCHEDULE A FOLLOW UP APPOINTMENT WITH DR. DALTON MCLEAN [phone]  PLEASE SCHEDULE A FOLLOW UP APPOINTMENT WITH DR. GREGG TAYLOR [phone]

## 2019-06-25 NOTE — Assessment & Plan Note (Signed)
History of essential hypertension with blood pressure measured today at [place].  He is on carvedilol, spironolactone and torsemide.

## 2019-06-25 NOTE — Assessment & Plan Note (Signed)
History of CAD status post CABG times 35/15/12.  Patient denies chest pain or shortness of breath

## 2019-06-25 NOTE — Assessment & Plan Note (Signed)
History of hyperlipidemia on statin therapy with lipid profile performed 01/23/2018 revealing total cholesterol of 171, LDL of 120 and HDL 33.

## 2019-06-25 NOTE — Assessment & Plan Note (Signed)
History of ICD implantation 11/04 with biventricular upgrade (CRT) 01/18/2011 followed by Dr. [name]

## 2019-06-25 NOTE — Progress Notes (Signed)
06/25/2019 [name]   August 04, 1927  [id number]  Primary Physician [name], MD Primary Cardiologist: Lorretta Harp MD Lupe Carney, [place]  HPI:  [name] is a 85 y.o. thin and frail appearing divorced African-American male father of 2 children (although he does not know where they are) who is accompanied by his caregiver and POA [name].  He has been taking care of by Drs. Lujean Amel in the past.  He has a history of hypertension hyperlipidemia.  He is never smoked.  He had CABG times 35/15/12.  He has ischemic cardiomyopathy with an EF of 35% by 2D echo last checked 09/21/2017.  He had an ICD placed 11/04 with biventricular upgrade/CRT 01/22/2014 followed by Dr. [name].  He does live alone in an apartment.  He denies chest pain or shortness of breath.   Current Meds  Medication Sig   allopurinol (ZYLOPRIM) 100 MG tablet Take 50 mg by mouth at bedtime.   atorvastatin (LIPITOR) 40 MG tablet GIVE 1 TABLET BY MOUTH EVERY EVENING   carvedilol (COREG) 6.25 MG tablet Take 1.5 tablets (9.375 mg total) by mouth 2 (two) times daily with a meal.   chlorhexidine (PERIDEX) 0.12 % solution 15 mLs by Mouth Rinse route 2 (two) times daily.   escitalopram (LEXAPRO) 10 MG tablet Take 10 mg by mouth daily.   finasteride (PROSCAR) 5 MG tablet Take 5 mg by mouth every evening.   Lactobacillus (ACIDOPHILUS PROBIOTIC PO) Take 1 tablet by mouth 2 (two) times daily.   LORazepam (ATIVAN) 0.5 MG tablet Take 1 tablet (0.5 mg total) by mouth 2 (two) times daily as needed for anxiety.   NITROSTAT 0.4 MG SL tablet Place 0.4 mg under the tongue every 5 (five) minutes as needed for chest pain (MAX 3 TABLETS).    pantoprazole (PROTONIX) 20 MG tablet Take 20 mg by mouth daily.   potassium chloride SA (K-DUR,KLOR-CON) 20 MEQ tablet Take 20 mEq by mouth daily.   spironolactone (ALDACTONE) 25 MG tablet Take 25 mg by mouth daily.   torsemide (DEMADEX) 20 MG tablet Take 2  tablets (40 mg total) every other day alternating with 1 tablet (20 mg total) every other day.     No Known Allergies  Social History   Socioeconomic History   Marital status: Divorced    Spouse name: Not on file   Number of children: Not on file   Years of education: Not on file   Highest education level: Not on file  Occupational History   Occupation: Retired  Scientist, product/process development strain: Not on file   Food insecurity    Worry: Not on file    Inability: Not on Lexicographer needs    Medical: Not on file    Non-medical: Not on file  Tobacco Use   Smoking status: Never Smoker   Smokeless tobacco: Former Systems developer    Types: Chew   Tobacco comment: no chew in over 3 years  Substance and Sexual Activity   Alcohol use: Yes    Comment: "might take a drink during the holidays"   Drug use: No   Sexual activity: Never  Lifestyle   Physical activity    Days per week: Not on file    Minutes per session: Not on file   Stress: Not on file  Relationships   Social connections    Talks on phone: Not on file    Gets together: Not on  file    Attends religious service: Not on file    Active member of club or organization: Not on file    Attends meetings of clubs or organizations: Not on file    Relationship status: Not on file   Intimate partner violence    Fear of current or ex partner: Not on file    Emotionally abused: Not on file    Physically abused: Not on file    Forced sexual activity: Not on file  Other Topics Concern   Not on file  Social History Narrative   Not on file     Review of Systems: General: negative for chills, fever, night sweats or weight changes.  Cardiovascular: negative for chest pain, dyspnea on exertion, edema, orthopnea, palpitations, paroxysmal nocturnal dyspnea or shortness of breath Dermatological: negative for rash Respiratory: negative for cough or wheezing Urologic: negative for hematuria Abdominal:  negative for nausea, vomiting, diarrhea, bright red blood per rectum, melena, or hematemesis Neurologic: negative for visual changes, syncope, or dizziness All other systems reviewed and are otherwise negative except as noted above.    Blood pressure 118/68, pulse 60, temperature 98.5 F (36.9 C), height 5\' 4"  (1.626 m), weight 153 lb (69.4 kg).  General appearance: alert and no distress Neck: no adenopathy, no carotid bruit, no JVD, supple, symmetrical, trachea midline and thyroid not enlarged, symmetric, no tenderness/mass/nodules Lungs: clear to auscultation bilaterally Heart: regular rate and rhythm, S1, S2 normal, no murmur, click, rub or gallop Extremities: extremities normal, atraumatic, no cyanosis or edema Pulses: 2+ and symmetric Skin: Skin color, texture, turgor normal. No rashes or lesions Neurologic: Alert and oriented X 3, normal strength and tone. Normal symmetric reflexes. Normal coordination and gait  EKG AV dual paced rhythm at 60.  I personally reviewed this EKG.  ASSESSMENT AND PLAN:   ICD (implantable cardioverter-defibrillator) in place History of ICD implantation 11/04 with biventricular upgrade (CRT) 01/18/2011 followed by Dr. [name]  CORONARY ARTERY BYPASS GRAFT, HX OF History of CAD status post CABG times 35/15/12.  Patient denies chest pain or shortness of breath  Hypertension History of essential hypertension with blood pressure measured today at [facility].  He is on carvedilol, spironolactone and torsemide.  Hyperlipidemia History of hyperlipidemia on statin therapy with lipid profile performed 01/23/2018 revealing total cholesterol of 171, LDL of 120 and HDL 33.  Ischemic cardiomyopathy History of ischemic cardiomyopathy with last echo performed 09/21/2017 revealing ejection fraction of 35%.  The patient is on carvedilol and has a biventricular ICD in place.      Lorretta Harp MD FACP,FACC,FAHA, [facility] 06/25/2019 4:37 PM

## 2019-06-25 NOTE — Progress Notes (Addendum)
EPIC Encounter for ICM Monitoring  Patient Name: [name] is a 85 y.o. male Date: 06/25/2019 Primary Care Physican: [name], MD Primary Cardiologist:Harwani/McLean  Electrophysiologist: Santina Evans Pacing: 93.8% 06/26/2019 Weight:153lbs        Transmission reviewed.   Patient is in the PACE program that manages his meds and labs. He has a neighbor that is CNA that is assisting patient with ADL's.   OptivolThoracic impedancenormal.  Prescribed:Torsemide20 mgtake 2 tablets (40 mgtotal) every other dayalternating with 20 mg every other day. Potassium 20 mEq take 1 tablet daily.  Labs: 01/03/2019 Creatinine 1.74, BUN 49, Potassium 4.4, Sodium 136, GFR 34-39 11/23/2018 Creatinine 1.85, BUN 40, Potassium 4.0, Sodium 137, GFR 32-37 11/14/2018 Creatinine 1.99, BUN 61, Potassium 4.1, Sodium 133, GFR 29-34 A complete set of results can be found in Results Review.  Recommendations: None.  Follow-up plan: ICM clinic phone appointment on 08/09/2019.   91 day device clinic remote transmission 08/08/2019.     Copy of ICM check sent to Dr. [name].   3 month ICM trend: 06/24/2019    1 Year ICM trend:       [name], RN 06/25/2019 4:16 PM

## 2019-06-25 NOTE — Assessment & Plan Note (Signed)
History of ischemic cardiomyopathy with last echo performed 09/21/2017 revealing ejection fraction of 35%.  The patient is on carvedilol and has a biventricular ICD in place.

## 2019-06-26 NOTE — Progress Notes (Signed)
Spoke with friend Fanny Bien.  Patient is doing well and had an office visit with Dr Gwenlyn Found yesterday.  06/25/2019 OV weight was 153 lbs.  Transmission reviewed.  No changes and encouraged to call if experiencing any fluid symptoms.

## 2019-07-26 ENCOUNTER — Other Ambulatory Visit (HOSPITAL_COMMUNITY): Payer: Self-pay | Admitting: Cardiology

## 2019-08-08 ENCOUNTER — Ambulatory Visit (INDEPENDENT_AMBULATORY_CARE_PROVIDER_SITE_OTHER): Payer: Medicare (Managed Care) | Admitting: *Deleted

## 2019-08-08 DIAGNOSIS — I5022 Chronic systolic (congestive) heart failure: Secondary | ICD-10-CM | POA: Diagnosis not present

## 2019-08-08 LAB — CUP PACEART REMOTE DEVICE CHECK
Battery Remaining Longevity: 14 mo
Battery Voltage: 2.88 V
Brady Statistic AP VP Percent: 73.14 %
Brady Statistic AP VS Percent: 0.58 %
Brady Statistic AS VP Percent: 24.77 %
Brady Statistic AS VS Percent: 1.51 %
Brady Statistic RA Percent Paced: 69.19 %
Brady Statistic RV Percent Paced: 91.06 %
Date Time Interrogation Session: 20201203033425
HighPow Impedance: 38 Ohm
HighPow Impedance: 47 Ohm
Implantable Lead Implant Date: 20040709
Implantable Lead Implant Date: 20041111
Implantable Lead Implant Date: 20130206
Implantable Lead Implant Date: 20150520
Implantable Lead Location: 753858
Implantable Lead Location: 753859
Implantable Lead Location: 753860
Implantable Lead Location: 753860
Implantable Lead Model: 5071
Implantable Lead Model: 5076
Implantable Lead Model: 5076
Implantable Lead Model: 6947
Implantable Pulse Generator Implant Date: 20150520
Lead Channel Impedance Value: 285 Ohm
Lead Channel Impedance Value: 304 Ohm
Lead Channel Impedance Value: 4047 Ohm
Lead Channel Impedance Value: 4047 Ohm
Lead Channel Impedance Value: 418 Ohm
Lead Channel Impedance Value: 456 Ohm
Lead Channel Pacing Threshold Amplitude: 0.625 V
Lead Channel Pacing Threshold Amplitude: 0.75 V
Lead Channel Pacing Threshold Amplitude: 1.625 V
Lead Channel Pacing Threshold Pulse Width: 0.4 ms
Lead Channel Pacing Threshold Pulse Width: 0.4 ms
Lead Channel Pacing Threshold Pulse Width: 0.8 ms
Lead Channel Sensing Intrinsic Amplitude: 2.625 mV
Lead Channel Sensing Intrinsic Amplitude: 2.625 mV
Lead Channel Sensing Intrinsic Amplitude: 3.5 mV
Lead Channel Sensing Intrinsic Amplitude: 3.5 mV
Lead Channel Setting Pacing Amplitude: 1.5 V
Lead Channel Setting Pacing Amplitude: 2 V
Lead Channel Setting Pacing Amplitude: 2.5 V
Lead Channel Setting Pacing Pulse Width: 0.4 ms
Lead Channel Setting Pacing Pulse Width: 0.8 ms
Lead Channel Setting Sensing Sensitivity: 0.3 mV

## 2019-08-09 ENCOUNTER — Ambulatory Visit (INDEPENDENT_AMBULATORY_CARE_PROVIDER_SITE_OTHER): Payer: Medicare (Managed Care)

## 2019-08-09 DIAGNOSIS — I5022 Chronic systolic (congestive) heart failure: Secondary | ICD-10-CM

## 2019-08-09 DIAGNOSIS — Z95810 Presence of automatic (implantable) cardiac defibrillator: Secondary | ICD-10-CM

## 2019-08-09 NOTE — Progress Notes (Signed)
EPIC Encounter for ICM Monitoring  Patient Name: [name] is a 85 y.o. male Date: 08/09/2019 Primary Care Physican: [name], MD Primary Cardiologist:Harwani/McLean  Electrophysiologist: Santina Evans Pacing: 90.7% 06/26/2019 Weight:153lbs   Spoke with [name], friend.    Patient is in the PACE program that manages his meds and labs. He has a neighbor that is CNA that is assisting patient with ADL's.   OptivolThoracic impedancenormal.  Prescribed:Torsemide20 mgtake 2 tablets (40 mgtotal) every other dayalternating with 20 mg every other day. Potassium 20 mEq take 1 tablet daily.  Labs: 01/03/2019 Creatinine 1.74, BUN 49, Potassium 4.4, Sodium 136, GFR 34-39 11/23/2018 Creatinine 1.85, BUN 40, Potassium 4.0, Sodium 137, GFR 32-37 11/14/2018 Creatinine 1.99, BUN 61, Potassium 4.1, Sodium 133, GFR 29-34 A complete set of results can be found in Results Review.  Recommendations: No changes and encouraged to call if experiencing any fluid symptoms.  Follow-up plan: ICM clinic phone appointment on 10/10/2019.   91 day device clinic remote transmission 11/07/2019.  Office visit scheduled 10/10/2019 with Dr [name]     3 month ICM trend: 08/08/2019    1 Year ICM trend:       [name], RN 08/09/2019 3:48 PM

## 2019-09-03 NOTE — Progress Notes (Signed)
ICD remote 

## 2019-09-09 ENCOUNTER — Ambulatory Visit (INDEPENDENT_AMBULATORY_CARE_PROVIDER_SITE_OTHER): Payer: Medicare (Managed Care)

## 2019-09-09 DIAGNOSIS — I5022 Chronic systolic (congestive) heart failure: Secondary | ICD-10-CM | POA: Diagnosis not present

## 2019-09-09 DIAGNOSIS — Z95810 Presence of automatic (implantable) cardiac defibrillator: Secondary | ICD-10-CM

## 2019-09-10 NOTE — Progress Notes (Signed)
EPIC Encounter for ICM Monitoring  Patient Name: [name] is a 86 y.o. male Date: 09/10/2019 Primary Care Physican: [name], MD Primary Cardiologist:Berry/McLean  Electrophysiologist: Santina Evans Pacing: 94.4% Last Weight:153lbs   Spoke with [name], friend and patient.  He has swelling in right leg and will try to elevate it.  Breathing is at baseline.    Patient is in the PACE program that manages his meds and labs. He has a neighbor that is CNA that is assisting patient with ADL's.  Last visit with Dr Aundra Dubin 08/21/2018  OptivolThoracic impedancesuggesting possible fluid accumulation since 08/12/2019.  Prescribed:Torsemide20 mgtake 2 tablets (40 mgtotal) every other dayalternating with 20 mg every other day. Potassium 20 mEq take 1 tablet daily.  Labs: 07/22/2019 Creatinine 2.10, BUN 46, Potassium 4.4, Sodium 136, GFR 27-31 01/03/2019 Creatinine 1.74, BUN 49, Potassium 4.4, Sodium 136, GFR 34-39 11/23/2018 Creatinine 1.85, BUN 40, Potassium 4.0, Sodium 137, GFR 32-37 11/14/2018 Creatinine 1.99, BUN 61, Potassium 4.1, Sodium 133, GFR 29-34 A complete set of results can be found in Results Review.  Recommendations: Reinforced limiting salt intake to < 2000 mg daily.  Advised to call Dr Claris Gladden office to set up routine appointment.   Follow-up plan: ICM clinic phone appointment on 09/16/2019 to recheck fluid levels.   91 day device clinic remote transmission 11/07/2019.  Office appt 10/10/2019 with Dr. [name].    Copy of ICM check sent to Dr. [name] and Dr Aundra Dubin.   3 month ICM trend: 09/09/2019    1 Year ICM trend:       [name], RN 09/10/2019 8:21 AM

## 2019-09-15 NOTE — Progress Notes (Signed)
Increase torsemide to 40 mg daily x 5 days then back to previous dosing.

## 2019-09-16 ENCOUNTER — Ambulatory Visit (INDEPENDENT_AMBULATORY_CARE_PROVIDER_SITE_OTHER): Payer: Medicare (Managed Care)

## 2019-09-16 DIAGNOSIS — I5022 Chronic systolic (congestive) heart failure: Secondary | ICD-10-CM

## 2019-09-16 DIAGNOSIS — Z95810 Presence of automatic (implantable) cardiac defibrillator: Secondary | ICD-10-CM

## 2019-09-16 NOTE — Progress Notes (Signed)
If impedance is normal, do not need to increase torsemide

## 2019-09-16 NOTE — Progress Notes (Signed)
09/16/2019 Optivol updated report.  Impedance has returned baseline normal.    Will check with Dr Aundra Dubin if should provide recommendation for temporary Torsemide increase for the 5 days before calling patient.

## 2019-09-17 NOTE — Progress Notes (Signed)
EPIC Encounter for ICM Monitoring  Patient Name: [name] is a 86 y.o. male Date: 09/17/2019 Primary Care Physican: [name], MD Electrophysiologist: Santina Evans Pacing: 94.7% Last Weight:153lbs   Spoke with [name], friend and patient is doing fine.     Patient is in the PACE program that manages his meds and labs. Patient's neighbor is a CNA that is assisting him with ADL's.   OptivolThoracic impedancereturned to baseline normal.  Prescribed:Torsemide20 mgtake 2 tablets (40 mgtotal) every other dayalternating with 20 mg every other day. Potassium 20 mEq take 1 tablet daily.  Labs: 07/22/2019 Creatinine 2.10, BUN 46, Potassium 4.4, Sodium 136, GFR 27-31 01/03/2019 Creatinine 1.74, BUN 49, Potassium 4.4, Sodium 136, GFR 34-39 11/23/2018 Creatinine 1.85, BUN 40, Potassium 4.0, Sodium 137, GFR 32-37 11/14/2018 Creatinine 1.99, BUN 61, Potassium 4.1, Sodium 133, GFR 29-34 A complete set of results can be found in Results Review.  Recommendations:  No changes and encouraged to call if experiencing any fluid symptoms.   Follow-up plan: ICM clinic phone appointment on 10/21/2019.   91 day device clinic remote transmission 11/07/2019.  Office appt 10/10/2019 with Dr. [name].    Copy of ICM check sent to Dr. [name].   3 month ICM trend: 09/16/2019    1 Year ICM trend:       [name], RN 09/17/2019 1:07 PM

## 2019-10-07 ENCOUNTER — Telehealth: Payer: Self-pay | Admitting: Internal Medicine

## 2019-10-07 NOTE — Telephone Encounter (Signed)
New Message    [name] is calling and says the pt will need assistance because he uses a wheel chair    Please advise

## 2019-10-08 NOTE — Telephone Encounter (Signed)
Call returned to Ailene Ravel ok to accompany Pt for mobility

## 2019-10-10 ENCOUNTER — Encounter: Payer: Self-pay | Admitting: Internal Medicine

## 2019-10-10 ENCOUNTER — Encounter (INDEPENDENT_AMBULATORY_CARE_PROVIDER_SITE_OTHER): Payer: Self-pay

## 2019-10-10 ENCOUNTER — Ambulatory Visit (INDEPENDENT_AMBULATORY_CARE_PROVIDER_SITE_OTHER): Payer: Medicare (Managed Care) | Admitting: Internal Medicine

## 2019-10-10 ENCOUNTER — Other Ambulatory Visit: Payer: Self-pay

## 2019-10-10 VITALS — BP 96/56 | HR 82 | Ht 64.0 in | Wt 142.2 lb

## 2019-10-10 DIAGNOSIS — I10 Essential (primary) hypertension: Secondary | ICD-10-CM

## 2019-10-10 DIAGNOSIS — I472 Ventricular tachycardia, unspecified: Secondary | ICD-10-CM

## 2019-10-10 DIAGNOSIS — I5022 Chronic systolic (congestive) heart failure: Secondary | ICD-10-CM | POA: Diagnosis not present

## 2019-10-10 DIAGNOSIS — Z951 Presence of aortocoronary bypass graft: Secondary | ICD-10-CM

## 2019-10-10 DIAGNOSIS — Z95810 Presence of automatic (implantable) cardiac defibrillator: Secondary | ICD-10-CM

## 2019-10-10 NOTE — Patient Instructions (Signed)
Medication Instructions:  Your physician recommends that you continue on your current medications as directed. Please refer to the Current Medication list given to you today.  Labwork: None ordered.  Testing/Procedures: None ordered.  Follow-Up: Your physician wants you to follow-up in: one year with Dr. [name].   You will receive a reminder letter in the mail two months in advance. If you don't receive a letter, please call our office to schedule the follow-up appointment.  Remote monitoring is used to monitor your ICD from home. This monitoring reduces the number of office visits required to check your device to one time per year. It allows [place] to keep an eye on the functioning of your device to ensure it is working properly. You are scheduled for a device check from home on 10/21/2019. You may send your transmission at any time that day. If you have a wireless device, the transmission will be sent automatically. After your physician reviews your transmission, you will receive a postcard with your next transmission date.  Any Other Special Instructions Will Be Listed Below (If Applicable).  If you need a refill on your cardiac medications before your next appointment, please call your pharmacy.

## 2019-10-10 NOTE — Progress Notes (Signed)
HPI [name] returns today for followup. He is a pleasant elderly man with chronic systolic heart failure, HTN, PVC's and CAD. He is s/p ICD insertion. He has a h/o VT with ATP. He denies anginal symptoms, and has had minimal edema. He denies dietary indiscretion. He has been in the hospital with a bowel obstruction. His incision has been draining and he has just had a wound vac placed. He still walks but is severely debilitated. Since I saw him last year, he has continued to become more sedentary.  No Known Allergies   Current Outpatient Medications  Medication Sig Dispense Refill  . atorvastatin (LIPITOR) 40 MG tablet GIVE 1 TABLET BY MOUTH EVERY EVENING 30 tablet 3  . carvedilol (COREG) 6.25 MG tablet Take 1.5 tablets (9.375 mg total) by mouth 2 (two) times daily with a meal. 90 tablet 11  . chlorhexidine (PERIDEX) 0.12 % solution 15 mLs by Mouth Rinse route 2 (two) times daily. 120 mL 0  . escitalopram (LEXAPRO) 10 MG tablet Take 10 mg by mouth daily.    . finasteride (PROSCAR) 5 MG tablet Take 5 mg by mouth every evening.    . Lactobacillus (ACIDOPHILUS PROBIOTIC PO) Take 1 tablet by mouth 2 (two) times daily.    Marland Kitchen LORazepam (ATIVAN) 0.5 MG tablet Take 1 tablet (0.5 mg total) by mouth 2 (two) times daily as needed for anxiety. 28 tablet 0  . NITROSTAT 0.4 MG SL tablet Place 0.4 mg under the tongue every 5 (five) minutes as needed for chest pain (MAX 3 TABLETS).     . pantoprazole (PROTONIX) 20 MG tablet Take 20 mg by mouth daily.    . potassium chloride SA (K-DUR,KLOR-CON) 20 MEQ tablet Take 20 mEq by mouth daily.    Marland Kitchen torsemide (DEMADEX) 20 MG tablet Take 2 tablets (40 mg total) every other day alternating with 1 tablet (20 mg total) every other day. 135 tablet 2   No current facility-administered medications for this visit.     Past Medical History:  Diagnosis Date  . AICD (automatic cardioverter/defibrillator) present   . Arthritis    "shoulders" (10/31/2014)  . CAD  (coronary artery disease)    s/p CABG; s/p Pacemaker  . Diverticulosis of colon   . GERD (gastroesophageal reflux disease)   . Gout   . Hyperlipidemia   . Hypertension   . Ischemic cardiomyopathy    s/p ICD  . On home oxygen therapy    "2L prn" (10/31/2014)  . Paroxysmal ventricular tachycardia ([place])   . SBO (small bowel obstruction) ([place]) 10/31/2014  . Shortness of breath dyspnea    with exertion  . Systolic CHF with reduced left ventricular function, NYHA class 2 ([place])     ROS:   All systems reviewed and negative except as noted in the HPI.   Past Surgical History:  Procedure Laterality Date  . BI-VENTRICULAR IMPLANTABLE CARDIOVERTER DEFIBRILLATOR UPGRADE N/A 01/22/2014   Procedure: BI-VENTRICULAR IMPLANTABLE CARDIOVERTER DEFIBRILLATOR UPGRADE;  Surgeon: Evans Lance, MD;  Location: [facility] CATH LAB;  Service: Cardiovascular;  Laterality: N/A;  . BOWEL RESECTION    . CARDIAC CATHETERIZATION  03/2011   Archie Endo 01/18/2011  . CARDIAC DEFIBRILLATOR PLACEMENT  07/2003   Archie Endo 01/18/2011  . CATARACT EXTRACTION W/ INTRAOCULAR LENS  IMPLANT, BILATERAL Bilateral   . CATARACT EXTRACTION W/PHACO Right 12/17/2014   Procedure: PHACOEMULSIFICATION CATARACT EXTRACTION WITH IOL IMPLANT RIGHT EYE;  Surgeon: Marylynn Pearson, MD;  Location: [place];  Service: Ophthalmology;  Laterality: Right;  .  CHOLECYSTECTOMY    . CORONARY ANGIOPLASTY WITH STENT PLACEMENT  04/2011   2 stents/notes 05/03/2011  . CORONARY ARTERY BYPASS GRAFT  03/2003   CABG X3/notes 01/18/2011  . ESOPHAGOGASTRODUODENOSCOPY (EGD) WITH PROPOFOL N/A 06/29/2018   Procedure: ESOPHAGOGASTRODUODENOSCOPY (EGD) WITH PROPOFOL;  Surgeon: Wilford Corner, MD;  Location: [facility] ENDOSCOPY;  Service: Endoscopy;  Laterality: N/A;  . EYE SURGERY Bilateral    [place]  . IMPLANTABLE CARDIOVERTER DEFIBRILLATOR (ICD) GENERATOR CHANGE Left 10/12/2011   Procedure: ICD GENERATOR CHANGE;  Surgeon: Evans Lance, MD;  Location: [facility] CATH LAB;  Service: Cardiovascular;   Laterality: Left;  . LAPAROTOMY N/A 06/26/2018   Procedure: EXPLORATORY LAPAROTOMY SMALL BOWEL RESECTION X 2;  Surgeon: Ileana Roup, MD;  Location: [facility] ORS;  Service: General;  Laterality: N/A;  . PACEMAKER PLACEMENT    . PACEMAKER REVISION N/A 10/12/2011   Procedure: PACEMAKER REVISION;  Surgeon: Evans Lance, MD;  Location: [facility] CATH LAB;  Service: Cardiovascular;  Laterality: N/A;  . TEE WITH CARDIOVERSION  03/2003   Archie Endo 01/18/2011  . TONSILLECTOMY       Family History  Problem Relation Age of Onset  . Heart Problems Mother   . Diabetes Mother   . Stroke Mother   . Heart Problems Brother      Social History   Socioeconomic History  . Marital status: Divorced    Spouse name: Not on file  . Number of children: Not on file  . Years of education: Not on file  . Highest education level: Not on file  Occupational History  . Occupation: Retired  Tobacco Use  . Smoking status: Never Smoker  . Smokeless tobacco: Former Systems developer    Types: Chew  . Tobacco comment: no chew in over 3 years  Substance and Sexual Activity  . Alcohol use: Yes    Comment: "might take a drink during the holidays"  . Drug use: No  . Sexual activity: Never  Other Topics Concern  . Not on file  Social History Narrative  . Not on file   Social Determinants of Health   Financial Resource Strain:   . Difficulty of Paying Living Expenses: Not on file  Food Insecurity:   . Worried About Charity fundraiser in the Last Year: Not on file  . Ran Out of Food in the Last Year: Not on file  Transportation Needs:   . Lack of Transportation (Medical): Not on file  . Lack of Transportation (Non-Medical): Not on file  Physical Activity:   . Days of Exercise per Week: Not on file  . Minutes of Exercise per Session: Not on file  Stress:   . Feeling of Stress : Not on file  Social Connections:   . Frequency of Communication with Friends and Family: Not on file  . Frequency of Social Gatherings with  Friends and Family: Not on file  . Attends Religious Services: Not on file  . Active Member of Clubs or Organizations: Not on file  . Attends Archivist Meetings: Not on file  . Marital Status: Not on file  Intimate Partner Violence:   . Fear of Current or Ex-Partner: Not on file  . Emotionally Abused: Not on file  . Physically Abused: Not on file  . Sexually Abused: Not on file     BP (!) 96/56   Pulse 82   Ht 5\' 4"  (1.626 m)   Wt 142 lb 3.2 oz (64.5 kg)   SpO2 98%   BMI 24.41  kg/m   Physical Exam:  Well appearing NAD HEENT: Unremarkable Neck:  No JVD, no thyromegally Lymphatics:  No adenopathy Back:  No CVA tenderness Lungs:  Clear with no wheezes HEART:  Regular rate rhythm, no murmurs, no rubs, no clicks Abd:  soft, positive bowel sounds, no organomegally, no rebound, no guarding Ext:  2 plus pulses, no edema, no cyanosis, no clubbing Skin:  No rashes no nodules Neuro:  CN II through XII intact, motor grossly intact   DEVICE  Normal device function.  See PaceArt for details. Underlying rhythm is atrial tachycardia at 160/min.  Assess/Plan: 1. Atrial tachycardia - today we paced him back to NSR through his device.  2. CHB - he is asymptomatic, s/p biv ICD 3. VT - he has not had any recent episodes but does have spells that have been pace terminated. 4. ICD - his medtronic Biv ICD is working normally. We will follow.  Mikle Bosworth.D.

## 2019-10-21 ENCOUNTER — Ambulatory Visit (INDEPENDENT_AMBULATORY_CARE_PROVIDER_SITE_OTHER): Payer: Medicare (Managed Care)

## 2019-10-21 DIAGNOSIS — I5022 Chronic systolic (congestive) heart failure: Secondary | ICD-10-CM

## 2019-10-21 DIAGNOSIS — Z95810 Presence of automatic (implantable) cardiac defibrillator: Secondary | ICD-10-CM

## 2019-10-22 LAB — CUP PACEART REMOTE DEVICE CHECK
Battery Remaining Longevity: 11 mo
Battery Voltage: 2.86 V
Brady Statistic AP VP Percent: 56.73 %
Brady Statistic AP VS Percent: 0.32 %
Brady Statistic AS VP Percent: 41.47 %
Brady Statistic AS VS Percent: 1.49 %
Brady Statistic RA Percent Paced: 56.74 %
Brady Statistic RV Percent Paced: 95.31 %
Date Time Interrogation Session: 20210216130606
HighPow Impedance: 38 Ohm
HighPow Impedance: 49 Ohm
Implantable Lead Implant Date: 20040709
Implantable Lead Implant Date: 20041111
Implantable Lead Implant Date: 20130206
Implantable Lead Implant Date: 20150520
Implantable Lead Location: 753858
Implantable Lead Location: 753859
Implantable Lead Location: 753860
Implantable Lead Location: 753860
Implantable Lead Model: 5071
Implantable Lead Model: 5076
Implantable Lead Model: 5076
Implantable Lead Model: 6947
Implantable Pulse Generator Implant Date: 20150520
Lead Channel Impedance Value: 304 Ohm
Lead Channel Impedance Value: 304 Ohm
Lead Channel Impedance Value: 4047 Ohm
Lead Channel Impedance Value: 4047 Ohm
Lead Channel Impedance Value: 418 Ohm
Lead Channel Impedance Value: 418 Ohm
Lead Channel Pacing Threshold Amplitude: 0.5 V
Lead Channel Pacing Threshold Amplitude: 0.75 V
Lead Channel Pacing Threshold Amplitude: 1.625 V
Lead Channel Pacing Threshold Pulse Width: 0.4 ms
Lead Channel Pacing Threshold Pulse Width: 0.4 ms
Lead Channel Pacing Threshold Pulse Width: 0.8 ms
Lead Channel Sensing Intrinsic Amplitude: 2.25 mV
Lead Channel Sensing Intrinsic Amplitude: 2.25 mV
Lead Channel Sensing Intrinsic Amplitude: 3.875 mV
Lead Channel Sensing Intrinsic Amplitude: 3.875 mV
Lead Channel Setting Pacing Amplitude: 1.5 V
Lead Channel Setting Pacing Amplitude: 2 V
Lead Channel Setting Pacing Amplitude: 2.5 V
Lead Channel Setting Pacing Pulse Width: 0.4 ms
Lead Channel Setting Pacing Pulse Width: 0.8 ms
Lead Channel Setting Sensing Sensitivity: 0.3 mV

## 2019-10-25 NOTE — Progress Notes (Signed)
EPIC Encounter for ICM Monitoring  Patient Name: [name] is a 86 y.o. male Date: 10/25/2019 Primary Care Physican: [name], MD Primary Cardiologist:Berry/McLean  Electrophysiologist: Santina Evans Pacing: 93.1% LastWeight:153lbs   Attempted call to friend [name], DPR and unable to reach.  Left detailed message per DPR regarding transmission. Transmission reviewed.     Patient is in the [facility] program that manages his meds and labs. Patient's neighbor is a CNA that is assisting him with ADL's.  OptivolThoracic impedancenormal.  Prescribed:Torsemide20 mgtake 2 tablets (40 mgtotal) every other dayalternating with 20 mg every other day. Potassium 20 mEq take 1 tablet daily.  Labs: 07/22/2019 Creatinine 2.10, BUN 46, Potassium 4.4, Sodium 136, GFR 27-31 01/03/2019 Creatinine 1.74, BUN 49, Potassium 4.4, Sodium 136, GFR 34-39 11/23/2018 Creatinine 1.85, BUN 40, Potassium 4.0, Sodium 137, GFR 32-37 11/14/2018 Creatinine 1.99, BUN 61, Potassium 4.1, Sodium 133, GFR 29-34 A complete set of results can be found in Results Review.  Recommendations: Left voice mail with ICM number and encouraged to call if experiencing any fluid symptoms.  Follow-up plan: ICM clinic phone appointment on3/22/2021. 91 day device clinic remote transmission 11/07/2019. Office visit with Dr Aundra Dubin 11/26/2019.  Copy of ICM check sent to Dr.Taylor.   3 month ICM trend: 10/21/2019    1 Year ICM trend:       [name], RN 10/25/2019 10:35 AM

## 2019-11-07 ENCOUNTER — Ambulatory Visit (INDEPENDENT_AMBULATORY_CARE_PROVIDER_SITE_OTHER): Payer: Medicare (Managed Care) | Admitting: *Deleted

## 2019-11-07 DIAGNOSIS — I5022 Chronic systolic (congestive) heart failure: Secondary | ICD-10-CM | POA: Diagnosis not present

## 2019-11-07 LAB — CUP PACEART REMOTE DEVICE CHECK
Battery Remaining Longevity: 11 mo
Battery Voltage: 2.87 V
Brady Statistic AP VP Percent: 75.4 %
Brady Statistic AP VS Percent: 0.87 %
Brady Statistic AS VP Percent: 22.61 %
Brady Statistic AS VS Percent: 1.12 %
Brady Statistic RA Percent Paced: 74.29 %
Brady Statistic RV Percent Paced: 93.99 %
Date Time Interrogation Session: 20210304043823
HighPow Impedance: 36 Ohm
HighPow Impedance: 46 Ohm
Implantable Lead Implant Date: 20040709
Implantable Lead Implant Date: 20041111
Implantable Lead Implant Date: 20130206
Implantable Lead Implant Date: 20150520
Implantable Lead Location: 753858
Implantable Lead Location: 753859
Implantable Lead Location: 753860
Implantable Lead Location: 753860
Implantable Lead Model: 5071
Implantable Lead Model: 5076
Implantable Lead Model: 5076
Implantable Lead Model: 6947
Implantable Pulse Generator Implant Date: 20150520
Lead Channel Impedance Value: 285 Ohm
Lead Channel Impedance Value: 304 Ohm
Lead Channel Impedance Value: 399 Ohm
Lead Channel Impedance Value: 4047 Ohm
Lead Channel Impedance Value: 4047 Ohm
Lead Channel Impedance Value: 456 Ohm
Lead Channel Pacing Threshold Amplitude: 0.625 V
Lead Channel Pacing Threshold Amplitude: 0.75 V
Lead Channel Pacing Threshold Amplitude: 1.625 V
Lead Channel Pacing Threshold Pulse Width: 0.4 ms
Lead Channel Pacing Threshold Pulse Width: 0.4 ms
Lead Channel Pacing Threshold Pulse Width: 0.8 ms
Lead Channel Sensing Intrinsic Amplitude: 2.75 mV
Lead Channel Sensing Intrinsic Amplitude: 2.75 mV
Lead Channel Sensing Intrinsic Amplitude: 3.75 mV
Lead Channel Sensing Intrinsic Amplitude: 3.75 mV
Lead Channel Setting Pacing Amplitude: 1.5 V
Lead Channel Setting Pacing Amplitude: 2 V
Lead Channel Setting Pacing Amplitude: 2.5 V
Lead Channel Setting Pacing Pulse Width: 0.4 ms
Lead Channel Setting Pacing Pulse Width: 0.8 ms
Lead Channel Setting Sensing Sensitivity: 0.3 mV

## 2019-11-07 NOTE — Progress Notes (Signed)
ICD Remote  

## 2019-11-25 ENCOUNTER — Telehealth: Payer: Self-pay

## 2019-11-25 ENCOUNTER — Ambulatory Visit (INDEPENDENT_AMBULATORY_CARE_PROVIDER_SITE_OTHER): Payer: Medicare (Managed Care)

## 2019-11-25 DIAGNOSIS — I5022 Chronic systolic (congestive) heart failure: Secondary | ICD-10-CM

## 2019-11-25 DIAGNOSIS — Z95810 Presence of automatic (implantable) cardiac defibrillator: Secondary | ICD-10-CM | POA: Diagnosis not present

## 2019-11-25 LAB — CUP PACEART REMOTE DEVICE CHECK
Battery Remaining Longevity: 10 mo
Battery Voltage: 2.86 V
Brady Statistic AP VP Percent: 82.36 %
Brady Statistic AP VS Percent: 0.34 %
Brady Statistic AS VP Percent: 16.82 %
Brady Statistic AS VS Percent: 0.48 %
Brady Statistic RA Percent Paced: 79.68 %
Brady Statistic RV Percent Paced: 93.38 %
Date Time Interrogation Session: 20210322022603
HighPow Impedance: 36 Ohm
HighPow Impedance: 47 Ohm
Implantable Lead Implant Date: 20040709
Implantable Lead Implant Date: 20041111
Implantable Lead Implant Date: 20130206
Implantable Lead Implant Date: 20150520
Implantable Lead Location: 753858
Implantable Lead Location: 753859
Implantable Lead Location: 753860
Implantable Lead Location: 753860
Implantable Lead Model: 5071
Implantable Lead Model: 5076
Implantable Lead Model: 5076
Implantable Lead Model: 6947
Implantable Pulse Generator Implant Date: 20150520
Lead Channel Impedance Value: 285 Ohm
Lead Channel Impedance Value: 304 Ohm
Lead Channel Impedance Value: 399 Ohm
Lead Channel Impedance Value: 4047 Ohm
Lead Channel Impedance Value: 4047 Ohm
Lead Channel Impedance Value: 418 Ohm
Lead Channel Pacing Threshold Amplitude: 0.625 V
Lead Channel Pacing Threshold Amplitude: 0.75 V
Lead Channel Pacing Threshold Amplitude: 1.625 V
Lead Channel Pacing Threshold Pulse Width: 0.4 ms
Lead Channel Pacing Threshold Pulse Width: 0.4 ms
Lead Channel Pacing Threshold Pulse Width: 0.8 ms
Lead Channel Sensing Intrinsic Amplitude: 2.5 mV
Lead Channel Sensing Intrinsic Amplitude: 2.5 mV
Lead Channel Sensing Intrinsic Amplitude: 3.5 mV
Lead Channel Sensing Intrinsic Amplitude: 3.5 mV
Lead Channel Setting Pacing Amplitude: 1.5 V
Lead Channel Setting Pacing Amplitude: 2 V
Lead Channel Setting Pacing Amplitude: 2.5 V
Lead Channel Setting Pacing Pulse Width: 0.4 ms
Lead Channel Setting Pacing Pulse Width: 0.8 ms
Lead Channel Setting Sensing Sensitivity: 0.3 mV

## 2019-11-25 NOTE — Telephone Encounter (Signed)
Spoke with pt POA, advised battery is approx 8 months to ERI, updated frequency of checks to monthly.

## 2019-11-26 ENCOUNTER — Ambulatory Visit (HOSPITAL_COMMUNITY)
Admission: RE | Admit: 2019-11-26 | Discharge: 2019-11-26 | Disposition: A | Discharge status: Home / Self Care | Payer: Medicare (Managed Care) | Source: Ambulatory Visit | Attending: Cardiology | Admitting: Cardiology

## 2019-11-26 ENCOUNTER — Encounter (HOSPITAL_COMMUNITY): Payer: Self-pay | Admitting: Cardiology

## 2019-11-26 ENCOUNTER — Other Ambulatory Visit: Payer: Self-pay

## 2019-11-26 VITALS — BP 108/55 | HR 60 | Wt 151.4 lb

## 2019-11-26 DIAGNOSIS — K219 Gastro-esophageal reflux disease without esophagitis: Secondary | ICD-10-CM | POA: Diagnosis not present

## 2019-11-26 DIAGNOSIS — R0602 Shortness of breath: Secondary | ICD-10-CM | POA: Insufficient documentation

## 2019-11-26 DIAGNOSIS — I5022 Chronic systolic (congestive) heart failure: Secondary | ICD-10-CM | POA: Diagnosis not present

## 2019-11-26 DIAGNOSIS — Z7982 Long term (current) use of aspirin: Secondary | ICD-10-CM | POA: Insufficient documentation

## 2019-11-26 DIAGNOSIS — Z955 Presence of coronary angioplasty implant and graft: Secondary | ICD-10-CM | POA: Diagnosis not present

## 2019-11-26 DIAGNOSIS — I130 Hypertensive heart and chronic kidney disease with heart failure and stage 1 through stage 4 chronic kidney disease, or unspecified chronic kidney disease: Secondary | ICD-10-CM | POA: Insufficient documentation

## 2019-11-26 DIAGNOSIS — Z951 Presence of aortocoronary bypass graft: Secondary | ICD-10-CM | POA: Diagnosis not present

## 2019-11-26 DIAGNOSIS — I255 Ischemic cardiomyopathy: Secondary | ICD-10-CM | POA: Diagnosis not present

## 2019-11-26 DIAGNOSIS — E785 Hyperlipidemia, unspecified: Secondary | ICD-10-CM | POA: Insufficient documentation

## 2019-11-26 DIAGNOSIS — I2510 Atherosclerotic heart disease of native coronary artery without angina pectoris: Secondary | ICD-10-CM | POA: Insufficient documentation

## 2019-11-26 DIAGNOSIS — Z79899 Other long term (current) drug therapy: Secondary | ICD-10-CM | POA: Insufficient documentation

## 2019-11-26 DIAGNOSIS — Z95810 Presence of automatic (implantable) cardiac defibrillator: Secondary | ICD-10-CM | POA: Diagnosis not present

## 2019-11-26 DIAGNOSIS — N183 Chronic kidney disease, stage 3 unspecified: Secondary | ICD-10-CM | POA: Insufficient documentation

## 2019-11-26 LAB — CBC
HCT: 32.5 % — ABNORMAL LOW (ref 39.0–52.0)
Hemoglobin: 10.8 g/dL — ABNORMAL LOW (ref 13.0–17.0)
MCH: 31.1 pg (ref 26.0–34.0)
MCHC: 33.2 g/dL (ref 30.0–36.0)
MCV: 93.7 fL (ref 80.0–100.0)
Platelets: 94 10*3/uL — ABNORMAL LOW (ref 150–400)
RBC: 3.47 MIL/uL — ABNORMAL LOW (ref 4.22–5.81)
RDW: 13.8 % (ref 11.5–15.5)
WBC: 3.5 10*3/uL — ABNORMAL LOW (ref 4.0–10.5)
nRBC: 0 % (ref 0.0–0.2)

## 2019-11-26 LAB — BASIC METABOLIC PANEL
Anion gap: 8 (ref 5–15)
BUN: 42 mg/dL — ABNORMAL HIGH (ref 8–23)
CO2: 25 mmol/L (ref 22–32)
Calcium: 9 mg/dL (ref 8.9–10.3)
Chloride: 105 mmol/L (ref 98–111)
Creatinine, Ser: 2.1 mg/dL — ABNORMAL HIGH (ref 0.61–1.24)
GFR calc Af Amer: 31 mL/min — ABNORMAL LOW (ref >60)
GFR calc non Af Amer: 27 mL/min — ABNORMAL LOW (ref >60)
Glucose, Bld: 132 mg/dL — ABNORMAL HIGH (ref 70–99)
Potassium: 4 mmol/L (ref 3.5–5.1)
Sodium: 138 mmol/L (ref 135–145)

## 2019-11-26 MED ORDER — ASPIRIN EC 81 MG PO TBEC: 81.0000 mg | DELAYED_RELEASE_TABLET | Freq: Every day | ORAL | 3 refills | Status: AC

## 2019-11-26 NOTE — Progress Notes (Signed)
EPIC Encounter for ICM Monitoring  Patient Name: [name] is a 86 y.o. male Date: 11/26/2019 Primary Care Physican: [name], MD Primary Cardiologist:Berry/McLean  Electrophysiologist: Santina Evans Pacing: 93.4% LastWeight:153lbs  ERI estimated 10 months   Transmission reviewed. Patient being seen in Dr Claris Gladden office today, 11/26/2019.  Patient is in the [facility] program that manages his meds and labs. Patient'sneighbor is aCNA that is assisting himwith ADL's.  OptivolThoracic impedance suggesting possible ongoing fluid accumulation since 11/11/2019 but improved starting 11/23/2019.  Fluid index < normal threshold.  Prescribed:  Torsemide20 mgtake 2 tablets (40 mgtotal) every other dayalternating with 20 mg every other day.   Potassium 20 mEq take 1 tablet daily.  Labs: 07/22/2019 Creatinine 2.10, BUN 46, Potassium 4.4, Sodium 136, GFR 27-31 01/03/2019 Creatinine 1.74, BUN 49, Potassium 4.4, Sodium 136, GFR 34-39 11/23/2018 Creatinine 1.85, BUN 40, Potassium 4.0, Sodium 137, GFR 32-37 11/14/2018 Creatinine 1.99, BUN 61, Potassium 4.1, Sodium 133, GFR 29-34 A complete set of results can be found in Results Review.  Recommendations:Recommendations will be given in Dr Claris Gladden office if needed.  Follow-up plan: ICM clinic phone appointment on3/29/2021 to recheck fluid levels. 91 day device clinic remote transmission 02/06/2020. Office visit with Dr Aundra Dubin 11/26/2019.  Copy of ICM check sent to Dr.Taylor.   3 month ICM trend: 11/25/2019    1 Year ICM trend:       [name], RN 11/26/2019 8:27 AM

## 2019-11-26 NOTE — Patient Instructions (Signed)
RESTART Aspirin 81mg  (1 tab) daily  Labs today We will only contact you if something comes back abnormal or we need to make some changes. Otherwise no news is good news!  Your physician recommends that you schedule a follow-up appointment in: 4 months with Dr Aundra Dubin.  We will call you to schedule this appointment.   Please call office at [phone] option [phone] if you have any questions or concerns.   At the [facility], you and your health needs are our priority. As part of our continuing mission to provide you with exceptional heart care, we have created designated Provider Care Teams. These Care Teams include your primary Cardiologist (physician) and Advanced Practice Providers (APPs- Physician Assistants and Nurse Practitioners) who all work together to provide you with the care you need, when you need it.   You may see any of the following providers on your designated Care Team at your next follow up: Marland Kitchen Dr Glori Bickers . Dr Loralie Champagne . Darrick Grinder, NP . Lyda Jester, PA . Audry Riles, PharmD   Please be sure to bring in all your medications bottles to every appointment.

## 2019-11-26 NOTE — Progress Notes (Signed)
Date:  11/26/2019   ID:  [name], DOB 11/19/1926, MRN [id number]  Provider location: [place] Advanced Heart Failure Type of Visit: Established patient   PCP:  [name], MD  Cardiologist:  No primary care provider on file. Primary HF: Dr Aundra Dubin  [facility]  Chief Complaint: heart Failure   History of Present Illness: [name] is a 86 y.o. male with a history of CAD s/p CABG and chronic systolic CHF from ischemic cardiomyopathy presents for HF clinic evaluation.  He has a Medtronic CRT-D device.  Echo in 11/15 showed EF 20-25%. He had VT x 2 in 9/17 terminated by ATP.   Admitted from clinic 06/10/16 with orthostasis and 24 lb weight loss after switch from Lasix to torsemide. Optivol with fluid index well below threshold and impedance continuing up. With orthostasis, light headedness, and living alone pt was admitted for dehydration. AKI noted on labwork. Diuretics held on admission and given IV fluid. Entresto stopped. Dizziness/orthostasis improved. Torsemide resumed 06/13/16, then cut back further on 10/10 and again on 06/15/16 for uptrend in BUN. Repeat Echo (10/17) showed EF 35-40%, mildly dilated RV with mild to moderately decreased systolic function, severe TR, PASP 48 mmHg.   Echo in 1/19 showed  EF 35% with wall motion abnormalities, mildly decreased RV systolic function, moderate TR.   He was admitted in 10/19 with ventral incisional hernia with incarcerated small bowel with ischemia.  He had resection of small bowel in the OR.    Weight is down 2 lbs since last appointment. He [facility] to [facility] during the week.  Due to elevated creatinine, torsemide was decreased to 20 mg daily and KCl to 20 qod.  Also, Coreg was decreased to 6.25 mg bid and atorvastatin to 20 mg daily.  BP is stable today, he denies lightheadedness.  No chest pain.  Fatigues easily, walks with a cane.  He gets short of breath walking 75-100 feet. No orthopnea/PND.    Labs (11/20): K 4.4,  creatinine 2.1, LDL 65  PMH: 1. Atrial flutter: Paroxysmal. 2. PVCs, h/o VT.  3. Chronic systolic CHF: Ischemic cardiomyopathy.  Medtronic CRT-D.   - Echo (11/15): EF 20-25%, mildly dilated LV.   - Echo (10/17) showed EF 35-40%, mildly dilated RV with mild to moderately decreased systolic function, severe TR, PASP 48 mmHg. - Echo (1/19) with EF 35%, regional WMAs, mildly decreased RV systolic function, moderate TR.  4. H/o SBO 5. HTN 6. Gout  7. Hyperlipidemia 8. GERD 9. CAD: s/p CABG in 5/12 with LIMA-LAD, SVG-ramus, SVG-D1, SVG-PDA. - PCI to OM1 and proximal LCx in 8/12.  10. CKD: Stage 3.  11. BPH 12. Ventral hernia with incarcerated small bowel, s/p resection of small bowel in OR in 10/19.   Past Surgical History:  Procedure Laterality Date  . BI-VENTRICULAR IMPLANTABLE CARDIOVERTER DEFIBRILLATOR UPGRADE N/A 01/22/2014   Procedure: BI-VENTRICULAR IMPLANTABLE CARDIOVERTER DEFIBRILLATOR UPGRADE;  Surgeon: Evans Lance, MD;  [facility] CATH LAB;  Service: Cardiovascular;  Laterality: N/A;  . BOWEL RESECTION    . CARDIAC CATHETERIZATION  03/2011   Archie Endo 01/18/2011  . CARDIAC DEFIBRILLATOR PLACEMENT  07/2003   Archie Endo 01/18/2011  . CATARACT EXTRACTION W/ INTRAOCULAR LENS  IMPLANT, BILATERAL Bilateral   . CATARACT EXTRACTION W/PHACO Right 12/17/2014   Procedure: PHACOEMULSIFICATION CATARACT EXTRACTION WITH IOL IMPLANT RIGHT EYE;  Surgeon: Marylynn Pearson, MD;  Location: [place];  Service: Ophthalmology;  Laterality: Right;  . CHOLECYSTECTOMY    . CORONARY ANGIOPLASTY WITH STENT  PLACEMENT  04/2011   2 stents/notes 05/03/2011  . CORONARY ARTERY BYPASS GRAFT  03/2003   CABG X3/notes 01/18/2011  . ESOPHAGOGASTRODUODENOSCOPY (EGD) WITH PROPOFOL N/A 06/29/2018   Procedure: ESOPHAGOGASTRODUODENOSCOPY (EGD) WITH PROPOFOL;  Surgeon: Wilford Corner, MD;  Location: [facility] ENDOSCOPY;  Service: Endoscopy;  Laterality: N/A;  . EYE SURGERY Bilateral    [place]  . IMPLANTABLE CARDIOVERTER DEFIBRILLATOR  (ICD) GENERATOR CHANGE Left 10/12/2011   Procedure: ICD GENERATOR CHANGE;  Surgeon: Evans Lance, MD;  Location: [facility] CATH LAB;  Service: Cardiovascular;  Laterality: Left;  . LAPAROTOMY N/A 06/26/2018   Procedure: EXPLORATORY LAPAROTOMY SMALL BOWEL RESECTION X 2;  Surgeon: Ileana Roup, MD;  Location: [facility]S;  Service: General;  Laterality: N/A;  . PACEMAKER PLACEMENT    . PACEMAKER REVISION N/A 10/12/2011   Procedure: PACEMAKER REVISION;  Surgeon: Evans Lance, MD;  Location: [facility] CATH LAB;  Service: Cardiovascular;  Laterality: N/A;  . TEE WITH CARDIOVERSION  03/2003   Archie Endo 01/18/2011  . TONSILLECTOMY       Current Outpatient Medications  Medication Sig Dispense Refill  . atorvastatin (LIPITOR) 20 MG tablet Take 20 mg by mouth daily.    . carvedilol (COREG) 6.25 MG tablet Take 6.25 mg by mouth 2 (two) times daily with a meal.    . chlorhexidine (PERIDEX) 0.12 % solution 15 mLs by Mouth Rinse route 2 (two) times daily. 120 mL 0  . escitalopram (LEXAPRO) 10 MG tablet Take 10 mg by mouth daily.    . finasteride (PROSCAR) 5 MG tablet Take 5 mg by mouth every evening.    . Lactobacillus (ACIDOPHILUS PROBIOTIC PO) Take 1 tablet by mouth 2 (two) times daily.    Marland Kitchen LORazepam (ATIVAN) 0.5 MG tablet Take 1 tablet (0.5 mg total) by mouth 2 (two) times daily as needed for anxiety. 28 tablet 0  . NITROSTAT 0.4 MG SL tablet Place 0.4 mg under the tongue every 5 (five) minutes as needed for chest pain (MAX 3 TABLETS).     . pantoprazole (PROTONIX) 20 MG tablet Take 20 mg by mouth daily.    . potassium chloride SA (K-DUR,KLOR-CON) 20 MEQ tablet Take 20 mEq by mouth every other day.     . torsemide (DEMADEX) 20 MG tablet Take 20 mg by mouth every other day.    Marland Kitchen aspirin EC 81 MG tablet Take 1 tablet (81 mg total) by mouth daily. 90 tablet 3   No current facility-administered medications for this encounter.    Allergies:   Patient has no known allergies.   Social History:  The patient  reports  that he has never smoked. He has quit using smokeless tobacco.  His smokeless tobacco use included chew. He reports current alcohol use. He reports that he does not use drugs.   Family History:  The patient's family history includes Diabetes in his mother; Heart Problems in his brother and mother; Stroke in his mother.   ROS:  Please see the history of present illness.   All other systems are personally reviewed and negative.   Exam:   BP (!) 108/55   Pulse 60   Wt 68.7 kg (151 lb 6.4 oz)   SpO2 99%   BMI 25.99 kg/m  General: NAD Neck: No JVD, no thyromegaly or thyroid nodule.  Lungs: Occasional rhonchi CV: Nondisplaced PMI.  Heart regular S1/S2, no S3/S4, no murmur.  No peripheral edema.  No carotid bruit.  Normal pedal pulses.  Abdomen: Soft, nontender, no hepatosplenomegaly, no distention.  Skin: Intact without lesions or rashes.  Neurologic: Alert and oriented x 3.  Psych: Normal affect. Extremities: No clubbing or cyanosis.  HEENT: Normal.   Recent Labs: 11/26/2019: BUN 42; Creatinine, Ser 2.10; Hemoglobin 10.8; Platelets 94; Potassium 4.0; Sodium 138  Personally reviewed   Wt Readings from Last 3 Encounters:  11/26/19 68.7 kg (151 lb 6.4 oz)  10/10/19 64.5 kg (142 lb 3.2 oz)  06/25/19 69.4 kg (153 lb)      ASSESSMENT AND PLAN:  1. Chronic systolic CHF: Ischemic cardiomyopathy.  He has a Medtronic CRT-D device.  Echo in 1/19 showed EF 35% which is stable.   NYHA III but does not appear volume overloaded on exam.  - Continue torsemide 20 mg daily with KCl 20 every other day.   - He has failed Entresto with marked hypotension and is off lisinopril with rise in creatinine.  - He is off spironolactone with elevated creatinine.  - Continue Coreg 6.25 mg bid, dose decreased due to low BP.   2. CAD: s/p CABG.  No chest pain.    - Continue statin, good lipids in 11/20.  - He needs to restart ASA 81 mg daily.   3. VT: ATP 9/17 was successful.   - Quiescent. Following with  EP.  4. Tricuspid regurgitation: Moderate on 1/19 echo.  No change.  5. CKD stage 3: Creatinine 2.1 in 11/20.   - BMET today.    Followup 4 months with NP/PA.   Signed, Loralie Champagne, MD  11/26/2019  [facility] [address] [facility] [place] [address] [phone] (office) [phone] (fax)

## 2019-12-02 ENCOUNTER — Telehealth: Payer: Self-pay

## 2019-12-02 ENCOUNTER — Ambulatory Visit (INDEPENDENT_AMBULATORY_CARE_PROVIDER_SITE_OTHER): Payer: Medicare (Managed Care)

## 2019-12-02 ENCOUNTER — Other Ambulatory Visit (HOSPITAL_COMMUNITY): Payer: Self-pay | Admitting: Cardiology

## 2019-12-02 DIAGNOSIS — I5022 Chronic systolic (congestive) heart failure: Secondary | ICD-10-CM

## 2019-12-02 DIAGNOSIS — Z95810 Presence of automatic (implantable) cardiac defibrillator: Secondary | ICD-10-CM

## 2019-12-02 NOTE — Progress Notes (Signed)
EPIC Encounter for ICM Monitoring  Patient Name: DEMON HOTOP is a 86 y.o. male Date: 12/02/2019 Primary Care Physican: [name], MD Primary Cardiologist:Berry/McLean Electrophysiologist: Santina Evans Pacing: 94.8% LastWeight:153lbs  ERI estimated 10 months   Attempted call to friend, [name] and unable to reach.  Transmission reviewed.   Patient is in the [facility] program that manages his meds and labs. Patient'sneighbor is aCNA that is assisting himwith ADL's.  OptivolThoracic impedance suggesting possible ongoing fluid accumulation since 11/11/2019.  Fluid index > threshold.  Prescribed:  Torsemide20 mgtake 1 tablet by mouth every other day. (decreased in March due to elevated Creatinine per Dr Claris Gladden 3/23 OV note)  Potassium 20 mEq take 1 tablet by mouth every other day  Labs: 11/26/2019 Creatinine 2.10, BUN 42, Potassium 4.0, Sodium 138, GFR 27-31 A complete set of results can be found in Results Review.  Recommendations: Unable to reach.    Follow-up plan: ICM clinic phone appointment on 12/16/2019 to recheck fluid levels. 91 day device clinic remote transmission 02/06/2020.   Copy of ICM check sent to Dr.Taylor and Dr Aundra Dubin.  3 month ICM trend: 12/02/2019    1 Year ICM trend:       [name], RN 12/02/2019 3:59 PM

## 2019-12-02 NOTE — Telephone Encounter (Signed)
Remote ICM transmission received.  Attempted call to friend [name], [place] regarding ICM remote transmission and no answer.

## 2019-12-02 NOTE — Progress Notes (Signed)
Increase torsemide to 40 mg daily x 3 days, then 40 daily alternating with 20 daily. BMET 1 week.

## 2019-12-03 ENCOUNTER — Telehealth: Payer: Self-pay | Admitting: Internal Medicine

## 2019-12-03 NOTE — Telephone Encounter (Signed)
No change in treatment. GT 

## 2019-12-03 NOTE — Telephone Encounter (Signed)
Follow Up:      Returning your call from today. 

## 2019-12-03 NOTE — Telephone Encounter (Signed)
CareAlert received for appropriate HV shock this morning at 3AM.  VT started below detection. 2 spins of ATP failed followed by HV shock that terminated VT.  Presenting rhythm (immediatley post shock) is AS/BiV pace with frequent ventricular ectopy.       Left message for patient and emergency contact to call the office to assess.  He should be put on Dr Tanna Furry schedule to discuss.   Chanetta Marshall, NP 12/03/2019 9:07 AM

## 2019-12-03 NOTE — Progress Notes (Signed)
Spoke with Chanetta Marshall, NP regarding Dr Claris Gladden recommendations since Dr Aundra Dubin is out of the office.  Amber advised to hold Dr Claris Gladden recommendations at this time since patient had device shock last night and will need to have lab work to determine K+ level.  Will recheck fluid levels on 12/10/2019 and latest labs at that time.

## 2019-12-03 NOTE — Telephone Encounter (Signed)
Spoke with Ms Izola Price, she is on vacation and not with [name] Buckert. She is unaware of his ICD shock from this morning. She did a 3 way call with Harrie Jeans, the neighbor who helps take care of [name].  He states he has been feeling well and was unaware of shock. Will route to Dr [name] for disposition.  Chanetta Marshall, NP 12/03/2019 1:44 PM

## 2019-12-10 ENCOUNTER — Ambulatory Visit (INDEPENDENT_AMBULATORY_CARE_PROVIDER_SITE_OTHER): Payer: Medicare (Managed Care)

## 2019-12-10 ENCOUNTER — Telehealth: Payer: Self-pay

## 2019-12-10 DIAGNOSIS — I5022 Chronic systolic (congestive) heart failure: Secondary | ICD-10-CM

## 2019-12-10 DIAGNOSIS — Z95810 Presence of automatic (implantable) cardiac defibrillator: Secondary | ICD-10-CM

## 2019-12-10 NOTE — Telephone Encounter (Signed)
Remote ICM transmission received.  Attempted call to friend [name], [name], regarding ICM remote transmission and left message to return call.

## 2019-12-10 NOTE — Progress Notes (Signed)
EPIC Encounter for ICM Monitoring  Patient Name: [name] is a 86 y.o. male Date: 12/10/2019 Primary Care Physican: [name], MD Primary Cardiologist:Berry/McLean Electrophysiologist: Santina Evans Pacing: 93.5% LastWeight:153lbs  ERI estimated 8 months   Attempted call to friend, [name] and unable to reach.  Transmission reviewed.   Patient is in the [facility] program that manages his meds and labs. Patient'sneighbor is aCNA that is assisting himwith ADL's.  OptivolThoracic impedancesuggesting possible ongoing fluid accumulation since 11/11/2019 but has returned close to baseline normal.  Prescribed:  Torsemide20 mgtake 1 tablet by mouth every other day. (decreased in March due to elevated Creatinine per Dr Claris Gladden 3/23 OV note)  Potassium 20 mEq take 1 tablet by mouth every other day  Labs: 11/26/2019 Creatinine 2.10, BUN 42, Potassium 4.0, Sodium 138, GFR 27-31 A complete set of results can be found in Results Review.  Recommendations: Unable to reach.    Follow-up plan: ICM clinic phone appointment on 4/19/2021to recheck fluid levels. 91 day device clinic remote transmission6/11/2019.   Copy of ICM check sent to Dr.Taylor.  3 month ICM trend: 12/09/2019    1 Year ICM trend:       [name], RN 12/10/2019 4:51 PM

## 2019-12-11 NOTE — Progress Notes (Signed)
Received call back from friend, [name].  She reports patient has slightly swelling in feet but otherwise without complaints.  He is elevating feet when sitting or lying down.  Advised will send report to Dr Aundra Dubin for review.  Patient is in [place] program.

## 2019-12-16 ENCOUNTER — Telehealth (HOSPITAL_COMMUNITY): Payer: Self-pay

## 2019-12-16 NOTE — Progress Notes (Signed)
Received call from [name].  She report patient passed away over the weekend.  He died in his sleep.

## 2019-12-16 NOTE — Telephone Encounter (Signed)
Received message from office staff Maretta Los that patient passed away.  She received a call from patients relative.

## 2020-01-04 DEATH — deceased

## 2020-03-24 ENCOUNTER — Encounter (HOSPITAL_COMMUNITY): Payer: Medicare (Managed Care) | Admitting: Cardiology

## 2020-07-21 IMAGING — CT CT ABD-PELV W/O CM
2 of 4 series · 15 of 46 positions shown, 17 images · non-contrast
Comparison: CT abdomen pelvis 10/31/2014

CLINICAL DATA: Acute abdominal pain

EXAM:
CT ABDOMEN AND PELVIS WITHOUT CONTRAST
TECHNIQUE: Multidetector CT imaging of the abdomen and pelvis was performed
following the standard protocol without IV contrast.

[Series 2: axial st · axial · 0.68mm/px · z∈[+1097,+1492]mm · 12 of 89 slices shown, 14 images]
[im 5/89  soft-tissue]
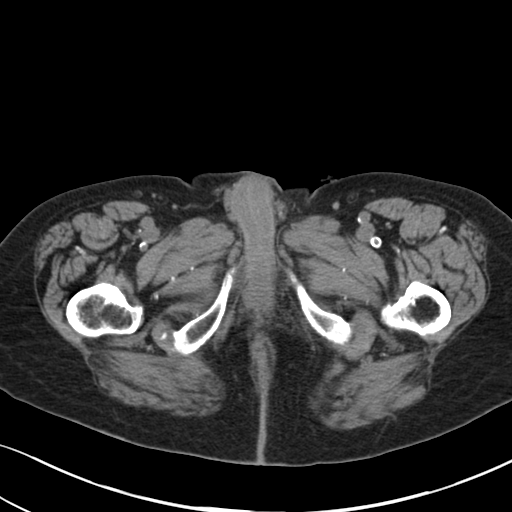
[im 5/89  bone]
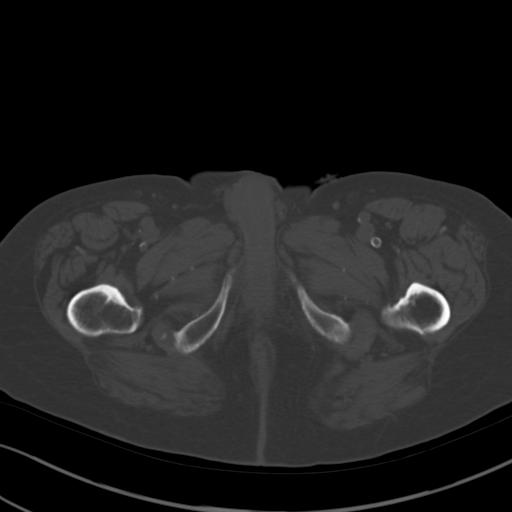
[im 14/89  soft-tissue]
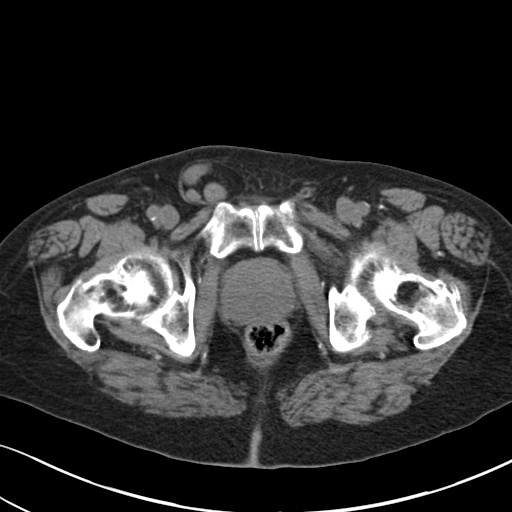
[im 18/89  soft-tissue]
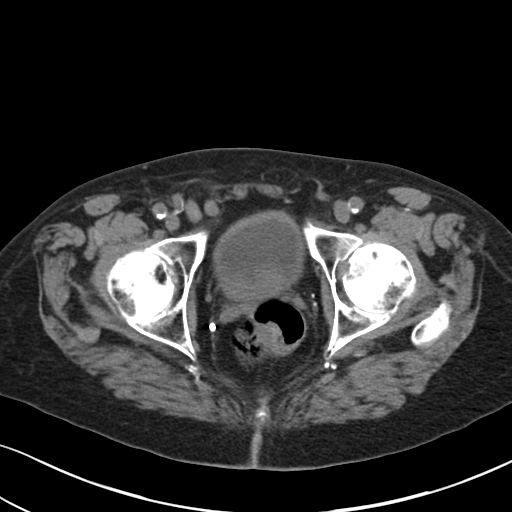
[im 27/89  soft-tissue]
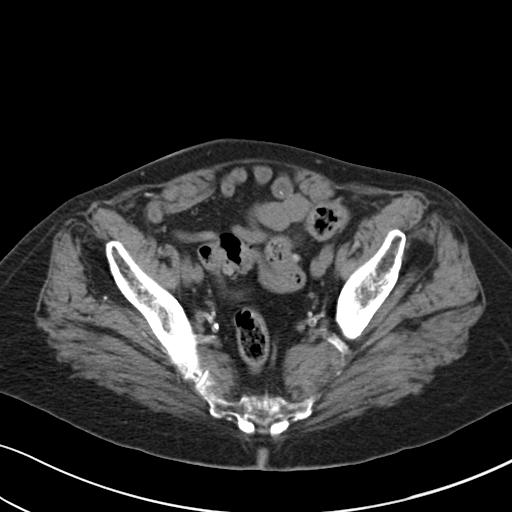
[im 36/89  soft-tissue]
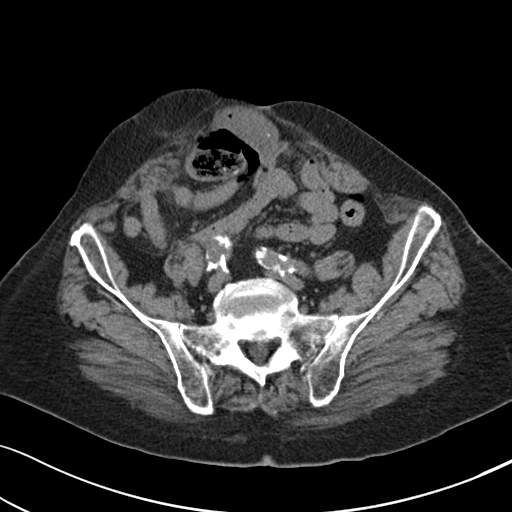
[im 40/89  soft-tissue]
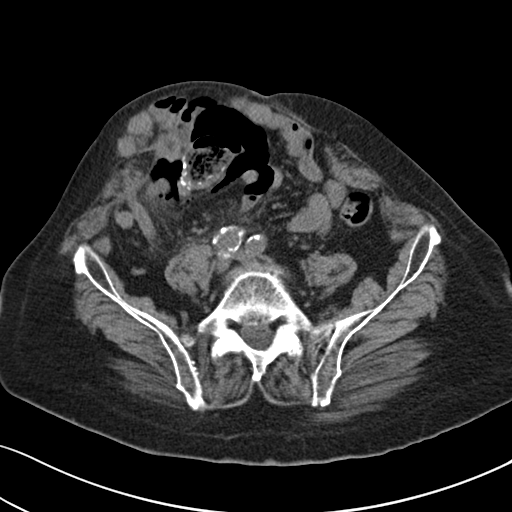
[im 49/89  soft-tissue]
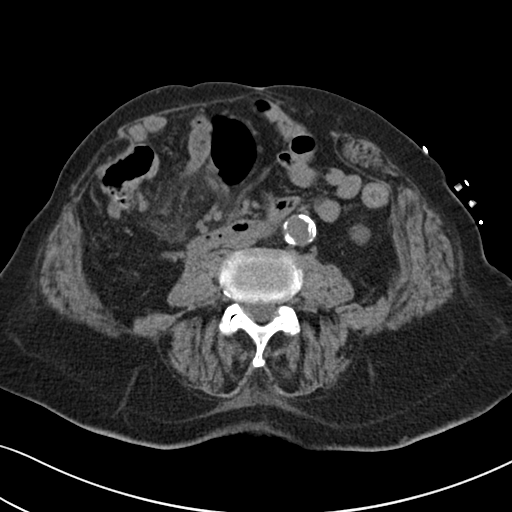
[im 53/89  soft-tissue]
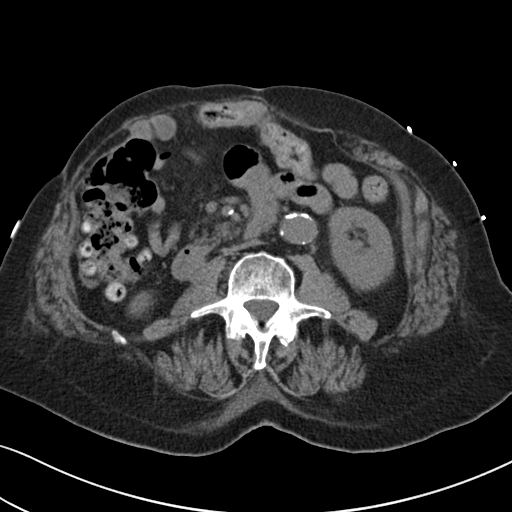
[im 62/89  soft-tissue]
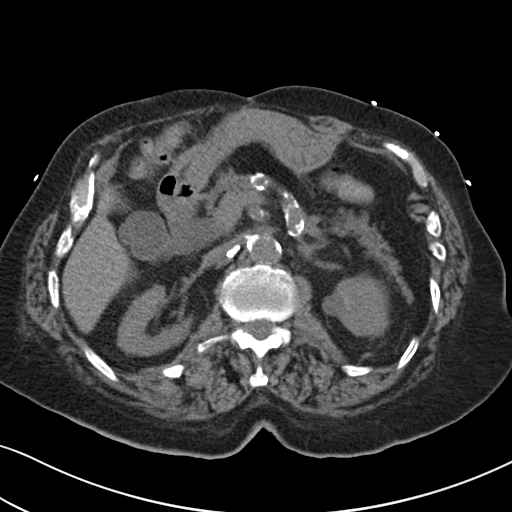
[im 62/89  bone]
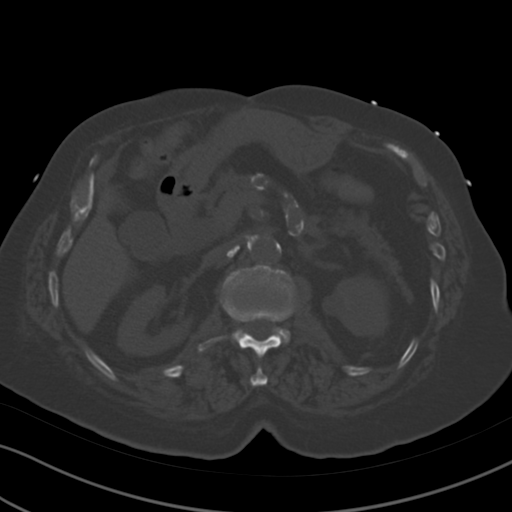
[im 71/89  soft-tissue]
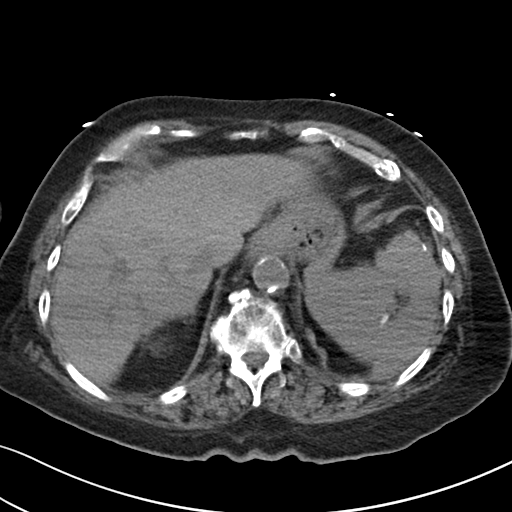
[im 75/89  soft-tissue]
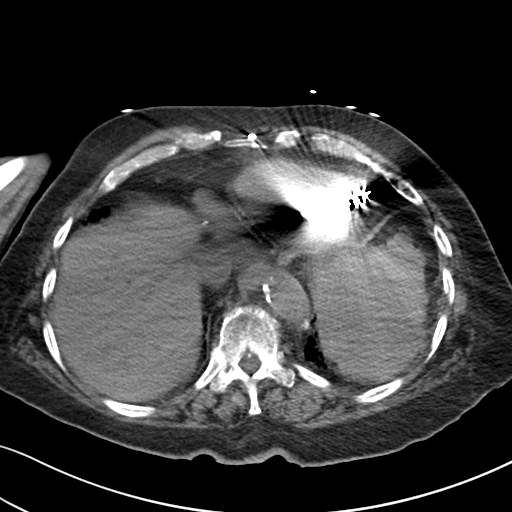
[im 84/89  soft-tissue]
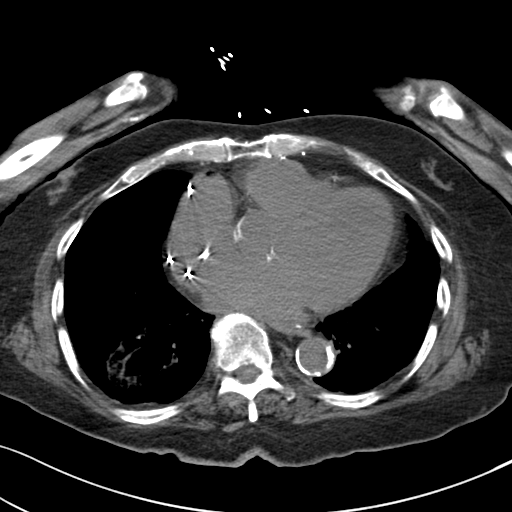

[Series 4: coronal st · coronal · 0.64mm/px · 3 of 85 slices shown]
[im 29/85  soft-tissue]
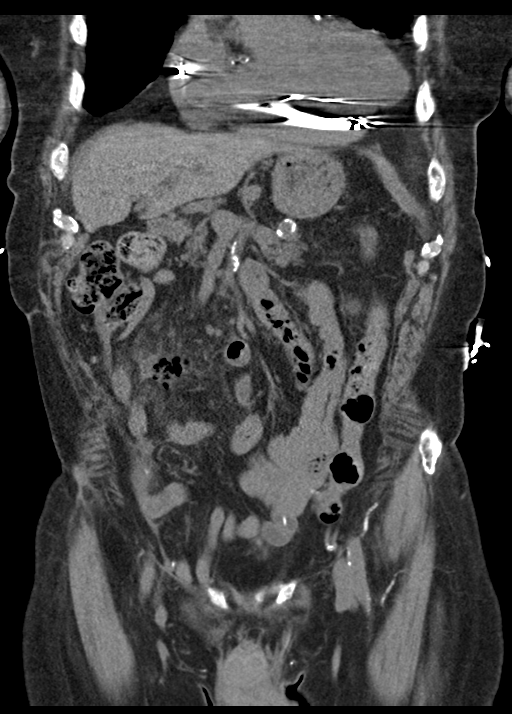
[im 38/85  soft-tissue]
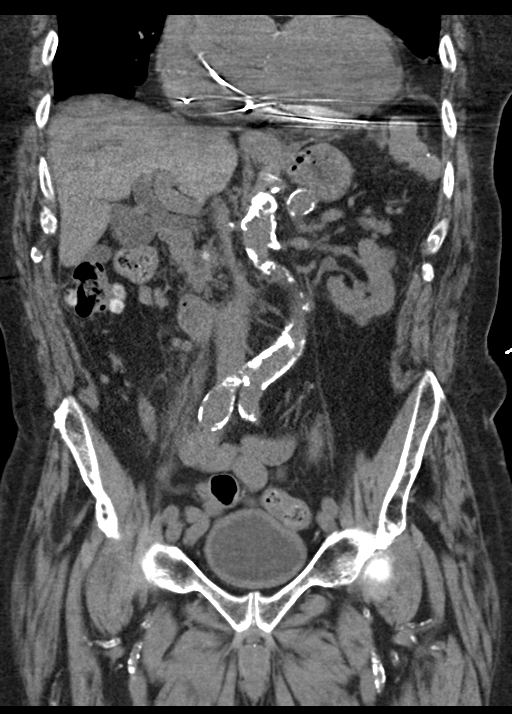
[im 47/85  soft-tissue]
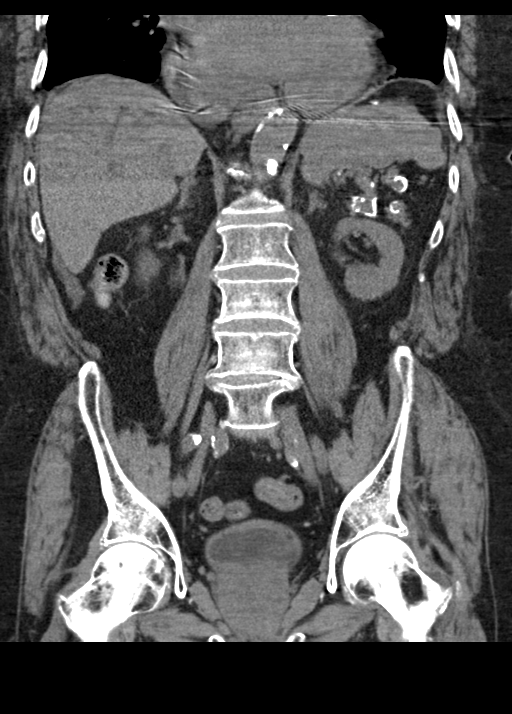

[15 of 46 positions shown; findings below may reference images not displayed]

FINDINGS: LOWER CHEST: Moderate cardiomegaly.  No pleural effusion.

HEPATOBILIARY: The hepatic contours and density are normal. There is
no intra- or extrahepatic biliary dilatation. The gallbladder is
normal. There is a 5 x 5 mm stone within the distal common bile
duct.

PANCREAS: The pancreatic parenchymal contours are normal and there
is no ductal dilatation. There is no peripancreatic fluid
collection.

SPLEEN: Normal.

ADRENALS/URINARY TRACT:

--Adrenal glands: Normal.

--Right kidney/ureter: No hydronephrosis, nephroureterolithiasis,
perinephric stranding or solid renal mass.

--Left kidney/ureter: No hydronephrosis, nephroureterolithiasis,
perinephric stranding or solid renal mass.

--Urinary bladder: Normal for degree of distention

STOMACH/BOWEL:

--Stomach/Duodenum: There is no hiatal hernia or other gastric
abnormality. The duodenal course and caliber are normal.

--Small bowel: There is a right lower quadrant ventral abdominal
hernia that contains portions of small bowel, including a segment
with an anastomotic junction and fecalized contents. There is
extraluminal gas surrounding the segment, some of which is probably
intramural (pneumatosis) and some of which appears to be
intraperitoneal. There is moderate surrounding inflammatory change.

--Colon: No focal abnormality.

--Appendix: Normal.

VASCULAR/LYMPHATIC: Atherosclerotic calcification is present within
the non-aneurysmal abdominal aorta, without hemodynamically
significant stenosis. No abdominal or pelvic lymphadenopathy.

REPRODUCTIVE: Enlarged prostate measures 5 cm in transverse
dimension.

MUSCULOSKELETAL. Grade 1 anterolisthesis at L4-5 due to facet
arthrosis. Large Schmorl's node at superior endplate of T12.

OTHER: Right inguinal hernia contains a loop of nondilated small
bowel.
IMPRESSION: 1. Right ventral abdominal hernia containing loops of small bowel,
with suspected pneumatosis and likely perforation at the site of
prior anastomosis. This likely indicates a short segment of ischemic
bowel.
2. 5 x 5 mm stone in the distal common bile duct without biliary
dilatation.
3. Right inguinal hernia containing loop of nondilated small bowel.
4.  Aortic Atherosclerosis ([id number]).

Critical Value/emergent results were called by telephone at the time
of interpretation on 06/25/2018 at [DATE] to Dr. FALLON JIM ,
who verbally acknowledged these results.

## 2020-07-22 IMAGING — DX DG CHEST 1V
1 series · 1 of 1 positions shown · non-contrast
Comparison: Chest x-ray of 08/25/2017

CLINICAL DATA: Postop central line placement

EXAM:
CHEST  1 VIEW

[chest ap]
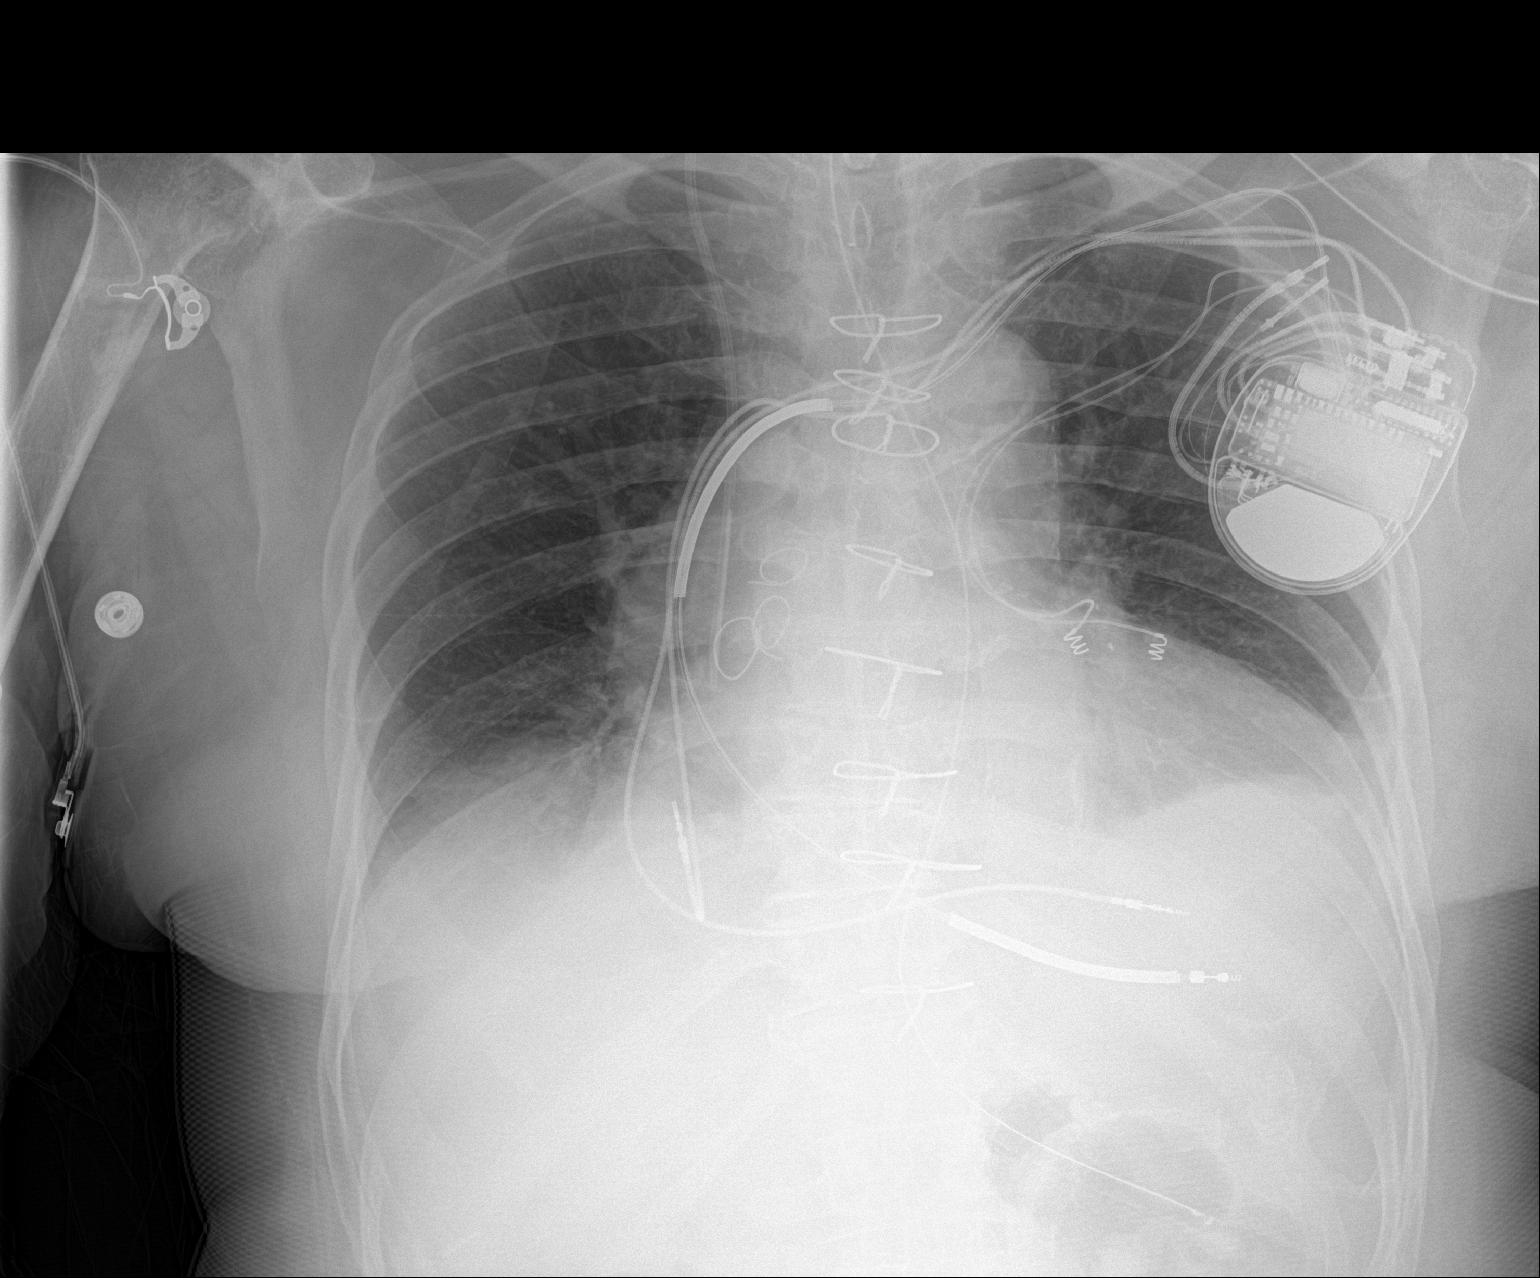

[1 of 1 positions shown; findings below may reference images not displayed]

FINDINGS: Right central venous line is present with tip overlying the expected
right atrium. No pneumothorax is seen. No pneumonia or effusion is
noted. There is mild left basilar atelectasis present. Cardiomegaly
is stable. Pacer and AICD leads are noted. NG tube extends into the
stomach.
IMPRESSION: 1. Right IJ central venous line tip overlies expected right atrium.
No pneumothorax.
2. Stable cardiomegaly with mild basilar volume loss.
3. No change in pacer and AICD leads.

## 2020-07-25 IMAGING — DX DG ABDOMEN 1V
1 series · 1 of 1 positions shown · non-contrast
Comparison: CT Abdomen and Pelvis 06/25/2018.

CLINICAL DATA: 89-year-old male NG tube placement.

EXAM:
ABDOMEN - 1 VIEW

[abdomen kub]
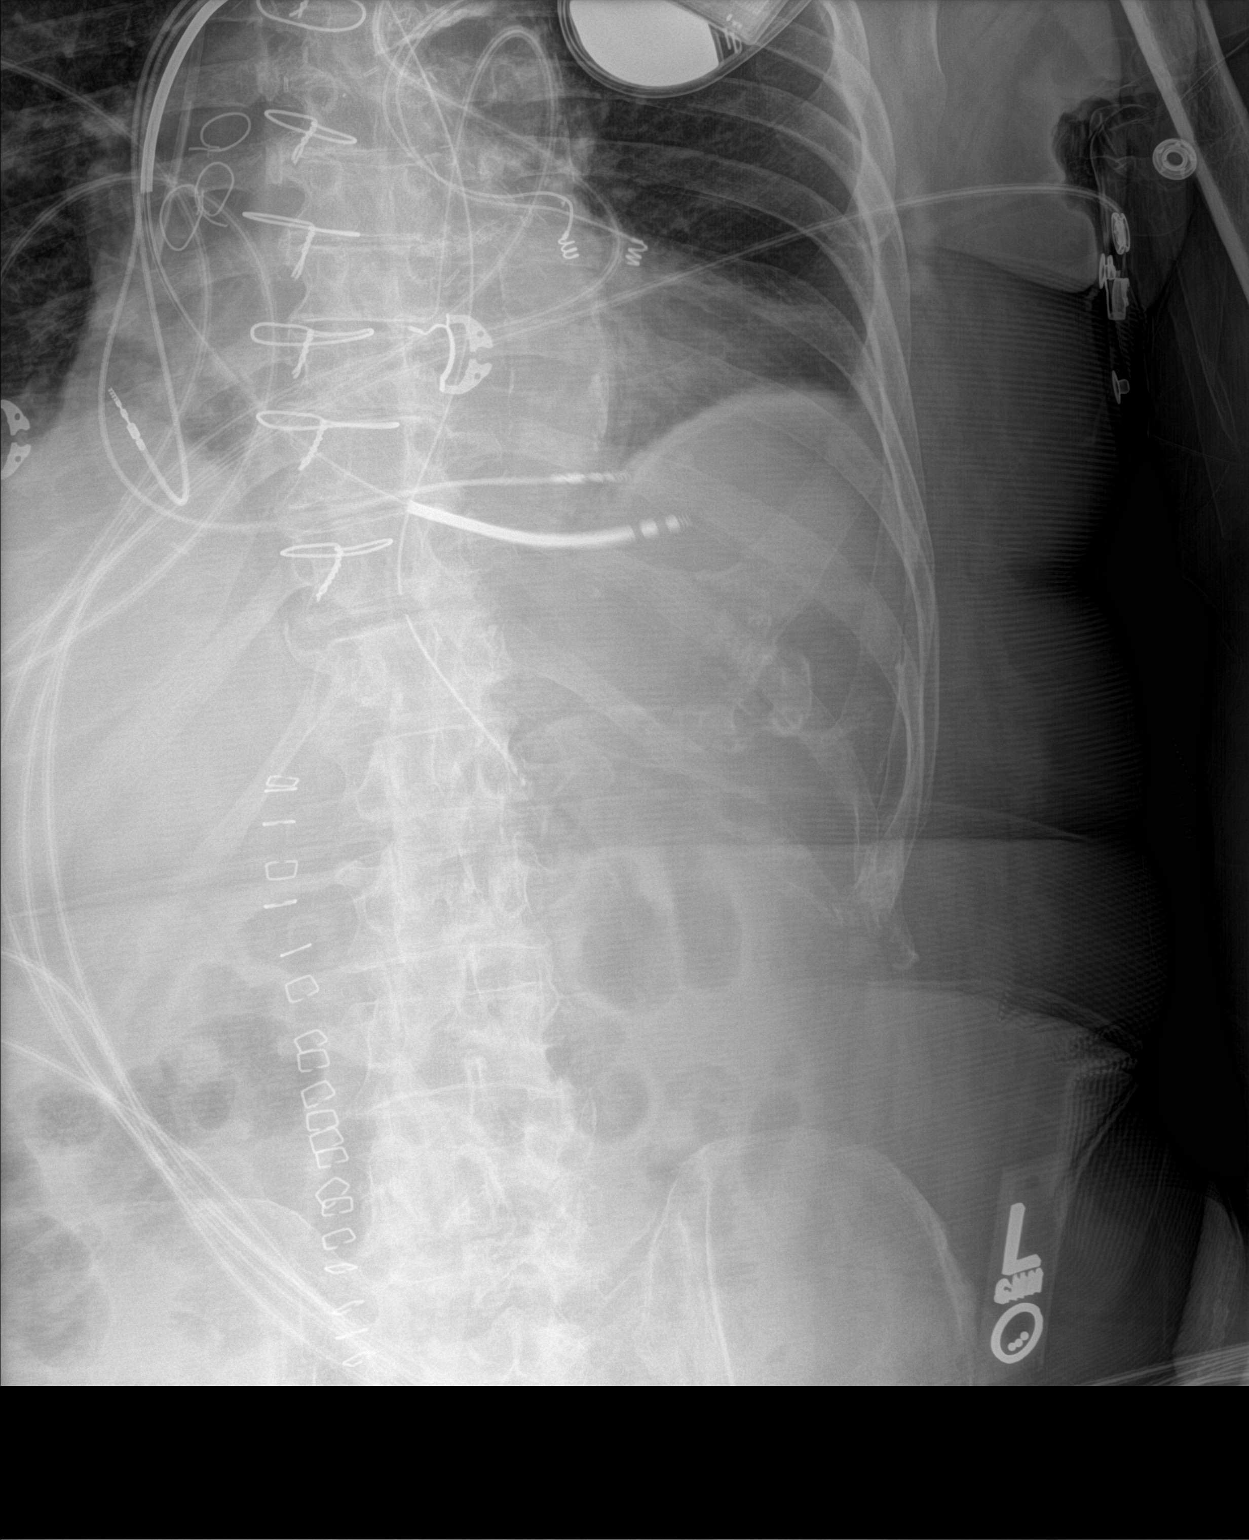

[1 of 1 positions shown; findings below may reference images not displayed]

FINDINGS: Portable AP view at [id number] hours. Enteric tube in place with side hole
in the midline below the diaphragm likely at or just beyond the
gastroesophageal junction.

Midline abdominal skin staples are new. Visible bowel gas pattern is
within normal limits. Partially visible left chest AICD. Calcified
aortic atherosclerosis. Grossly negative lung bases.
IMPRESSION: 1. Enteric tube placed to the stomach. Side hole could be at or just
inside the GEJ. Advance 4-5 centimeters for more optimal placement.
2. Interval postoperative changes to the abdominal wall. Negative
visible bowel gas pattern.
